# Patient Record
Sex: Male | Born: 1960 | Race: White | Hispanic: No | Smoking: Former smoker
Health system: Southern US, Community
[De-identification: ages and names within clinical notes are randomized; demographics above are authoritative.]

## PROBLEM LIST (undated history)

## (undated) DIAGNOSIS — I898 Other specified noninfective disorders of lymphatic vessels and lymph nodes: Secondary | ICD-10-CM

## (undated) DIAGNOSIS — J189 Pneumonia, unspecified organism: Secondary | ICD-10-CM

## (undated) DIAGNOSIS — I2510 Atherosclerotic heart disease of native coronary artery without angina pectoris: Secondary | ICD-10-CM

## (undated) DIAGNOSIS — I509 Heart failure, unspecified: Secondary | ICD-10-CM

## (undated) DIAGNOSIS — I10 Essential (primary) hypertension: Secondary | ICD-10-CM

## (undated) DIAGNOSIS — I872 Venous insufficiency (chronic) (peripheral): Secondary | ICD-10-CM

## (undated) DIAGNOSIS — D649 Anemia, unspecified: Secondary | ICD-10-CM

## (undated) DIAGNOSIS — F329 Major depressive disorder, single episode, unspecified: Secondary | ICD-10-CM

## (undated) DIAGNOSIS — F32A Depression, unspecified: Secondary | ICD-10-CM

## (undated) DIAGNOSIS — Z87442 Personal history of urinary calculi: Secondary | ICD-10-CM

## (undated) DIAGNOSIS — K759 Inflammatory liver disease, unspecified: Secondary | ICD-10-CM

## (undated) DIAGNOSIS — G4730 Sleep apnea, unspecified: Secondary | ICD-10-CM

## (undated) DIAGNOSIS — R011 Cardiac murmur, unspecified: Secondary | ICD-10-CM

## (undated) DIAGNOSIS — M1990 Unspecified osteoarthritis, unspecified site: Secondary | ICD-10-CM

## (undated) HISTORY — PX: HERNIA REPAIR: SHX51

## (undated) HISTORY — DX: Anemia, unspecified: D64.9

## (undated) HISTORY — PX: CARPAL TUNNEL RELEASE: SHX101

## (undated) HISTORY — DX: Atherosclerotic heart disease of native coronary artery without angina pectoris: I25.10

## (undated) HISTORY — PX: TONSILLECTOMY: SUR1361

## (undated) HISTORY — PX: EYE SURGERY: SHX253

## (undated) HISTORY — DX: Other specified noninfective disorders of lymphatic vessels and lymph nodes: I89.8

## (undated) HISTORY — PX: GASTROPLASTY DUODENAL SWITCH: SHX1699

## (undated) HISTORY — PX: CORONARY ARTERY BYPASS GRAFT: SHX141

## (undated) HISTORY — DX: Unspecified osteoarthritis, unspecified site: M19.90

---

## 2007-10-04 ENCOUNTER — Ambulatory Visit: Payer: Self-pay | Admitting: Internal Medicine

## 2007-11-18 ENCOUNTER — Ambulatory Visit: Payer: Self-pay | Admitting: Internal Medicine

## 2008-02-21 ENCOUNTER — Emergency Department (HOSPITAL_COMMUNITY): Admission: EM | Admit: 2008-02-21 | Discharge: 2008-02-21 | Payer: Self-pay | Admitting: Emergency Medicine

## 2008-03-15 ENCOUNTER — Emergency Department (HOSPITAL_COMMUNITY): Admission: EM | Admit: 2008-03-15 | Discharge: 2008-03-15 | Payer: Self-pay | Admitting: Emergency Medicine

## 2011-01-16 LAB — DIFFERENTIAL
Basophils Absolute: 0 10*3/uL (ref 0.0–0.1)
Basophils Relative: 0 % (ref 0–1)
Monocytes Absolute: 0.5 10*3/uL (ref 0.1–1.0)
Neutro Abs: 4.1 10*3/uL (ref 1.7–7.7)
Neutrophils Relative %: 68 % (ref 43–77)

## 2011-01-16 LAB — BASIC METABOLIC PANEL
BUN: 9 mg/dL (ref 6–23)
CO2: 28 mEq/L (ref 19–32)
Calcium: 8.9 mg/dL (ref 8.4–10.5)
Creatinine, Ser: 0.73 mg/dL (ref 0.4–1.5)
Glucose, Bld: 115 mg/dL — ABNORMAL HIGH (ref 70–99)

## 2011-01-16 LAB — CBC
Hemoglobin: 12.5 g/dL — ABNORMAL LOW (ref 13.0–17.0)
MCHC: 34 g/dL (ref 30.0–36.0)
RDW: 14.1 % (ref 11.5–15.5)

## 2012-09-20 ENCOUNTER — Inpatient Hospital Stay (HOSPITAL_COMMUNITY)
Admission: EM | Admit: 2012-09-20 | Discharge: 2012-09-24 | DRG: 603 | Disposition: A | Discharge status: Home / Self Care | Payer: Medicaid Other | Attending: Internal Medicine | Admitting: Internal Medicine

## 2012-09-20 ENCOUNTER — Encounter (HOSPITAL_COMMUNITY): Payer: Self-pay | Admitting: *Deleted

## 2012-09-20 ENCOUNTER — Emergency Department (HOSPITAL_COMMUNITY): Payer: Medicaid Other

## 2012-09-20 DIAGNOSIS — I872 Venous insufficiency (chronic) (peripheral): Secondary | ICD-10-CM | POA: Diagnosis present

## 2012-09-20 DIAGNOSIS — D638 Anemia in other chronic diseases classified elsewhere: Secondary | ICD-10-CM | POA: Diagnosis present

## 2012-09-20 DIAGNOSIS — Z833 Family history of diabetes mellitus: Secondary | ICD-10-CM

## 2012-09-20 DIAGNOSIS — L97909 Non-pressure chronic ulcer of unspecified part of unspecified lower leg with unspecified severity: Secondary | ICD-10-CM | POA: Diagnosis present

## 2012-09-20 DIAGNOSIS — I8312 Varicose veins of left lower extremity with inflammation: Secondary | ICD-10-CM

## 2012-09-20 DIAGNOSIS — Z7982 Long term (current) use of aspirin: Secondary | ICD-10-CM

## 2012-09-20 DIAGNOSIS — E669 Obesity, unspecified: Secondary | ICD-10-CM | POA: Diagnosis present

## 2012-09-20 DIAGNOSIS — R7309 Other abnormal glucose: Secondary | ICD-10-CM | POA: Diagnosis present

## 2012-09-20 DIAGNOSIS — Z6841 Body Mass Index (BMI) 40.0 and over, adult: Secondary | ICD-10-CM

## 2012-09-20 DIAGNOSIS — IMO0001 Reserved for inherently not codable concepts without codable children: Secondary | ICD-10-CM

## 2012-09-20 DIAGNOSIS — E876 Hypokalemia: Secondary | ICD-10-CM | POA: Diagnosis present

## 2012-09-20 DIAGNOSIS — Z86718 Personal history of other venous thrombosis and embolism: Secondary | ICD-10-CM

## 2012-09-20 DIAGNOSIS — I8311 Varicose veins of right lower extremity with inflammation: Secondary | ICD-10-CM

## 2012-09-20 DIAGNOSIS — L02419 Cutaneous abscess of limb, unspecified: Principal | ICD-10-CM | POA: Diagnosis present

## 2012-09-20 DIAGNOSIS — L03115 Cellulitis of right lower limb: Secondary | ICD-10-CM

## 2012-09-20 DIAGNOSIS — M79609 Pain in unspecified limb: Secondary | ICD-10-CM

## 2012-09-20 DIAGNOSIS — F17200 Nicotine dependence, unspecified, uncomplicated: Secondary | ICD-10-CM | POA: Diagnosis present

## 2012-09-20 HISTORY — DX: Venous insufficiency (chronic) (peripheral): I87.2

## 2012-09-20 HISTORY — DX: Morbid (severe) obesity due to excess calories: E66.01

## 2012-09-20 LAB — COMPREHENSIVE METABOLIC PANEL
ALT: 15 U/L (ref 0–53)
AST: 17 U/L (ref 0–37)
Albumin: 3 g/dL — ABNORMAL LOW (ref 3.5–5.2)
Alkaline Phosphatase: 71 U/L (ref 39–117)
Chloride: 98 mEq/L (ref 96–112)
Potassium: 2.9 mEq/L — ABNORMAL LOW (ref 3.5–5.1)
Total Bilirubin: 0.5 mg/dL (ref 0.3–1.2)

## 2012-09-20 LAB — POCT I-STAT, CHEM 8
Creatinine, Ser: 0.9 mg/dL (ref 0.50–1.35)
Glucose, Bld: 124 mg/dL — ABNORMAL HIGH (ref 70–99)
Hemoglobin: 12.6 g/dL — ABNORMAL LOW (ref 13.0–17.0)
TCO2: 29 mmol/L (ref 0–100)

## 2012-09-20 LAB — CBC WITH DIFFERENTIAL/PLATELET
Basophils Absolute: 0.1 10*3/uL (ref 0.0–0.1)
Basophils Relative: 1 % (ref 0–1)
Hemoglobin: 12.2 g/dL — ABNORMAL LOW (ref 13.0–17.0)
MCHC: 34.1 g/dL (ref 30.0–36.0)
Monocytes Relative: 11 % (ref 3–12)
Neutro Abs: 7.9 10*3/uL — ABNORMAL HIGH (ref 1.7–7.7)
Neutrophils Relative %: 73 % (ref 43–77)
RDW: 13.7 % (ref 11.5–15.5)

## 2012-09-20 LAB — RETICULOCYTES
RBC.: 4.45 MIL/uL (ref 4.22–5.81)
Retic Count, Absolute: 40.1 10*3/uL (ref 19.0–186.0)
Retic Ct Pct: 0.9 % (ref 0.4–3.1)

## 2012-09-20 LAB — BASIC METABOLIC PANEL
CO2: 31 mEq/L (ref 19–32)
Chloride: 98 mEq/L (ref 96–112)
Creatinine, Ser: 0.73 mg/dL (ref 0.50–1.35)
GFR calc Af Amer: >90 mL/min (ref >90)
Sodium: 138 mEq/L (ref 135–145)

## 2012-09-20 LAB — MAGNESIUM: Magnesium: 2.1 mg/dL (ref 1.5–2.5)

## 2012-09-20 MED ORDER — ASPIRIN EC 81 MG PO TBEC
81.0000 mg | DELAYED_RELEASE_TABLET | Freq: Every day | ORAL | Status: DC
  Administered 2012-09-20 – 2012-09-24 (×5): 81 mg via ORAL
  Filled 2012-09-20 (×5): qty 1

## 2012-09-20 MED ORDER — POTASSIUM CHLORIDE CRYS ER 20 MEQ PO TBCR
40.0000 meq | EXTENDED_RELEASE_TABLET | ORAL | Status: AC
  Administered 2012-09-20 (×2): 40 meq via ORAL
  Filled 2012-09-20 (×2): qty 2

## 2012-09-20 MED ORDER — SODIUM CHLORIDE 0.9 % IJ SOLN
3.0000 mL | Freq: Two times a day (BID) | INTRAMUSCULAR | Status: DC
  Administered 2012-09-21 – 2012-09-23 (×3): 3 mL via INTRAVENOUS

## 2012-09-20 MED ORDER — ENOXAPARIN SODIUM 40 MG/0.4ML ~~LOC~~ SOLN: 40.0000 mg | SUBCUTANEOUS | Status: DC

## 2012-09-20 MED ORDER — ACETAMINOPHEN 650 MG RE SUPP: 650.0000 mg | Freq: Four times a day (QID) | RECTAL | Status: DC | PRN

## 2012-09-20 MED ORDER — SODIUM CHLORIDE 0.9 % IV SOLN
Freq: Once | INTRAVENOUS | Status: AC
  Administered 2012-09-20: 07:00:00 via INTRAVENOUS

## 2012-09-20 MED ORDER — ADULT MULTIVITAMIN W/MINERALS CH
1.0000 | ORAL_TABLET | Freq: Every day | ORAL | Status: DC
  Administered 2012-09-20 – 2012-09-24 (×5): 1 via ORAL
  Filled 2012-09-20 (×5): qty 1

## 2012-09-20 MED ORDER — SODIUM CHLORIDE 0.9 % IV SOLN
INTRAVENOUS | Status: AC
  Administered 2012-09-20: 12:00:00 via INTRAVENOUS

## 2012-09-20 MED ORDER — ONDANSETRON HCL 4 MG PO TABS: 4.0000 mg | ORAL_TABLET | Freq: Four times a day (QID) | ORAL | Status: DC | PRN

## 2012-09-20 MED ORDER — MORPHINE SULFATE 2 MG/ML IJ SOLN
2.0000 mg | INTRAMUSCULAR | Status: DC | PRN
  Administered 2012-09-20 – 2012-09-22 (×3): 2 mg via INTRAVENOUS
  Filled 2012-09-20 (×3): qty 1

## 2012-09-20 MED ORDER — ACETAMINOPHEN 325 MG PO TABS: 650.0000 mg | ORAL_TABLET | Freq: Four times a day (QID) | ORAL | Status: DC | PRN

## 2012-09-20 MED ORDER — VANCOMYCIN HCL 10 G IV SOLR
1500.0000 mg | Freq: Two times a day (BID) | INTRAVENOUS | Status: DC
  Administered 2012-09-20 – 2012-09-22 (×3): 1500 mg via INTRAVENOUS
  Filled 2012-09-20 (×5): qty 1500

## 2012-09-20 MED ORDER — METHADONE HCL 10 MG PO TABS
170.0000 mg | ORAL_TABLET | Freq: Every day | ORAL | Status: DC
  Administered 2012-09-21 – 2012-09-24 (×4): 170 mg via ORAL
  Filled 2012-09-20 (×4): qty 17

## 2012-09-20 MED ORDER — POTASSIUM CHLORIDE CRYS ER 20 MEQ PO TBCR
40.0000 meq | EXTENDED_RELEASE_TABLET | Freq: Once | ORAL | Status: AC
  Administered 2012-09-20: 40 meq via ORAL
  Filled 2012-09-20: qty 2

## 2012-09-20 MED ORDER — ENOXAPARIN SODIUM 80 MG/0.8ML ~~LOC~~ SOLN
80.0000 mg | SUBCUTANEOUS | Status: DC
  Administered 2012-09-20 – 2012-09-23 (×4): 80 mg via SUBCUTANEOUS
  Filled 2012-09-20 (×5): qty 0.8

## 2012-09-20 MED ORDER — ONDANSETRON HCL 4 MG/2ML IJ SOLN: 4.0000 mg | Freq: Four times a day (QID) | INTRAMUSCULAR | Status: DC | PRN

## 2012-09-20 MED ORDER — SODIUM CHLORIDE 0.9 % IV SOLN: 250.0000 mL | INTRAVENOUS | Status: DC | PRN

## 2012-09-20 MED ORDER — METHADONE HCL 10 MG/ML PO CONC: 170.0000 mg | Freq: Every day | ORAL | Status: DC

## 2012-09-20 MED ORDER — VANCOMYCIN HCL IN DEXTROSE 1-5 GM/200ML-% IV SOLN
1000.0000 mg | Freq: Once | INTRAVENOUS | Status: AC
  Administered 2012-09-20: 1000 mg via INTRAVENOUS
  Filled 2012-09-20: qty 200

## 2012-09-20 MED ORDER — NICOTINE 21 MG/24HR TD PT24
21.0000 mg | MEDICATED_PATCH | Freq: Every day | TRANSDERMAL | Status: DC
  Administered 2012-09-20 – 2012-09-23 (×4): 21 mg via TRANSDERMAL
  Filled 2012-09-20 (×5): qty 1

## 2012-09-20 MED ORDER — SODIUM CHLORIDE 0.9 % IJ SOLN: 3.0000 mL | INTRAMUSCULAR | Status: DC | PRN

## 2012-09-20 MED ORDER — VITAMIN B-1 100 MG PO TABS
100.0000 mg | ORAL_TABLET | Freq: Every day | ORAL | Status: DC
Start: 2012-09-20 — End: 2012-09-24
  Administered 2012-09-20 – 2012-09-24 (×5): 100 mg via ORAL
  Filled 2012-09-20 (×5): qty 1

## 2012-09-20 MED ORDER — FOLIC ACID 1 MG PO TABS
1.0000 mg | ORAL_TABLET | Freq: Every day | ORAL | Status: DC
  Administered 2012-09-20 – 2012-09-24 (×5): 1 mg via ORAL
  Filled 2012-09-20 (×5): qty 1

## 2012-09-20 NOTE — Progress Notes (Signed)
ANTIBIOTIC CONSULT NOTE - INITIAL  Pharmacy Consult for Vancomycin Indication: Right leg cellulitis  No Known Allergies  Patient Measurements: Height: 6' 0.44" (184 cm) Weight: 360 lb 0.2 oz (163.3 kg) IBW/kg (Calculated) : 78.61  Vital Signs: Temp: 98.3 F (36.8 C) (06/10 1157) Temp src: Oral (06/10 1157) BP: 145/85 mmHg (06/10 1157) Pulse Rate: 81 (06/10 1157) Intake/Output from previous day:   Intake/Output from this shift:    Labs:  Recent Labs  09/20/12 0610 09/20/12 0620  WBC 10.8*  --   HGB 12.2* 12.6*  PLT 234  --   CREATININE 0.62 0.90   Estimated Creatinine Clearance: 156.3 ml/min (by C-G formula based on Cr of 0.9). No results found for this basename: VANCOTROUGH, VANCOPEAK, VANCORANDOM, GENTTROUGH, GENTPEAK, GENTRANDOM, TOBRATROUGH, TOBRAPEAK, TOBRARND, AMIKACINPEAK, AMIKACINTROU, AMIKACIN,  in the last 72 hours   Microbiology: No results found for this or any previous visit (from the past 720 hour(s)).  Medical History: Past Medical History  Diagnosis Date  . Venous insufficiency of leg   . Morbid obesity   . Venous stasis dermatitis     Assessment: 52 y.o. M who presented to [facility] with right leg swelling, redness, and pain for ~1 week. Dopplers were negative for a DVT and pharmacy was consulted to start Vancomycin for RLE cellulitis. The patient received Vancomycin 1g around 0700 in the [facility] this morning. Wt: 163.3 kg, SCr 0.9, CrCl~90 ml/min.   Since the patient was not adequately loaded when they started Vancomycin this morning -- will schedule the first dose to be given a little earlier.   Goal of Therapy:  Vancomycin trough level 10-15 mcg/ml  Plan:  1. Vancomycin 1500 mg IV every 12 hours 2. Will continue to follow renal function, culture results, LOT, and antibiotic de-escalation plans   Georgina Pillion, PharmD, BCPS Clinical Pharmacist Pager: [phone] 09/20/2012 12:36 PM

## 2012-09-20 NOTE — ED Provider Notes (Signed)
History     CSN: [id number]  Arrival date & time 09/20/12  4098   First MD Initiated Contact with Patient 09/20/12 0602      Chief Complaint  Patient presents with  . Leg Pain  . Leg Swelling  . Wound Infection    (Consider location/radiation/quality/duration/timing/severity/associated sxs/prior treatment) HPI [name] is a 52 y.o. male who presents to ED with complaint of right lower leg swelling and pain. States he has peripheral vascular disease, at present not followed by anyone due to lack of insurance. Not on any medications. States dropped a pipe on his right lower leg hitting the medial ankle about 3 months ago. Since then, developed a non healing wound to that area. States has been putting triple antibiotic ointment daily. In the last 2-3 days, states entire right leg swelled up and now red and painful to the touch up to this thigh. Denies fever, admits to chills. Did not check temp at home. Pt works as a [employer]. Mentions possible dvt in the past but states never took any blood thinner medications. Denies hx of the same.    Past Medical History  Diagnosis Date  . Venous insufficiency of leg   . DVT (deep venous thrombosis)     Past Surgical History  Procedure Laterality Date  . Carpal tunnel release      bilateral   . Eye surgery      No family history on file.  History  Substance Use Topics  . Smoking status: Current Every Day Smoker -- 0.50 packs/day    Types: Cigarettes  . Smokeless tobacco: Not on file  . Alcohol Use: No      Review of Systems  Constitutional: Positive for chills. Negative for fever and fatigue.  Respiratory: Negative.   Cardiovascular: Positive for leg swelling. Negative for chest pain and palpitations.  Gastrointestinal: Negative.   Musculoskeletal: Positive for myalgias.  Skin: Positive for color change and wound.  Neurological: Negative for weakness and numbness.  All other systems reviewed and are  negative.    Allergies  Review of patient's allergies indicates no known allergies.  Home Medications  No current outpatient prescriptions on file.  BP 142/78  Temp(Src) 99.9 F (37.7 C) (Oral)  Resp 18  SpO2 95%  Physical Exam  Nursing note and vitals reviewed. Constitutional: He appears well-developed and well-nourished. No distress.  HENT:  Head: Normocephalic.  Eyes: Conjunctivae are normal.  Cardiovascular: Normal rate, regular rhythm and normal heart sounds.   Pulmonary/Chest: Effort normal and breath sounds normal. No respiratory distress. He has no wheezes. He has no rales.  Musculoskeletal: He exhibits no edema.  Superficial draining, malodorous skin ulceration to the right medial ankle. There is swelling to the right lower extremity up to the thigh. Erythema, warmth to the touch from the foot up to the medial thigh. Foot is warm, good cap refill <2sec to the toes.   Neurological: He is alert.  Skin: Skin is warm and dry.    ED Course  Procedures (including critical care time)  Results for orders placed during the hospital encounter of 09/20/12  CBC WITH DIFFERENTIAL      Result Value Range   WBC 10.8 (*) 4.0 - 10.5 K/uL   RBC 4.34  4.22 - 5.81 MIL/uL   Hemoglobin 12.2 (*) 13.0 - 17.0 g/dL   HCT 11.9 (*) 14.7 - 82.9 %   MCV 82.5  78.0 - 100.0 fL   MCH 28.1  26.0 - 34.0 pg  MCHC 34.1  30.0 - 36.0 g/dL   RDW 86.5  78.4 - 69.6 %   Platelets 234  150 - 400 K/uL   Neutrophils Relative % 73  43 - 77 %   Neutro Abs 7.9 (*) 1.7 - 7.7 K/uL   Lymphocytes Relative 15  12 - 46 %   Lymphs Abs 1.7  0.7 - 4.0 K/uL   Monocytes Relative 11  3 - 12 %   Monocytes Absolute 1.2 (*) 0.1 - 1.0 K/uL   Eosinophils Relative 0  0 - 5 %   Eosinophils Absolute 0.0  0.0 - 0.7 K/uL   Basophils Relative 1  0 - 1 %   Basophils Absolute 0.1  0.0 - 0.1 K/uL  COMPREHENSIVE METABOLIC PANEL      Result Value Range   Sodium 133 (*) 135 - 145 mEq/L   Potassium 2.9 (*) 3.5 - 5.1 mEq/L    Chloride 98  96 - 112 mEq/L   CO2 25  19 - 32 mEq/L   Glucose, Bld 121 (*) 70 - 99 mg/dL   BUN 6  6 - 23 mg/dL   Creatinine, Ser 2.95  0.50 - 1.35 mg/dL   Calcium 9.1  8.4 - 28.4 mg/dL   Total Protein 7.8  6.0 - 8.3 g/dL   Albumin 3.0 (*) 3.5 - 5.2 g/dL   AST 17  0 - 37 U/L   ALT 15  0 - 53 U/L   Alkaline Phosphatase 71  39 - 117 U/L   Total Bilirubin 0.5  0.3 - 1.2 mg/dL   GFR calc non Af Amer >90  >90 mL/min   GFR calc Af Amer >90  >90 mL/min  BASIC METABOLIC PANEL      Result Value Range   Sodium 138  135 - 145 mEq/L   Potassium 3.2 (*) 3.5 - 5.1 mEq/L   Chloride 98  96 - 112 mEq/L   CO2 31  19 - 32 mEq/L   Glucose, Bld 100 (*) 70 - 99 mg/dL   BUN 6  6 - 23 mg/dL   Creatinine, Ser 1.32  0.50 - 1.35 mg/dL   Calcium 9.2  8.4 - 44.0 mg/dL   GFR calc non Af Amer >90  >90 mL/min   GFR calc Af Amer >90  >90 mL/min  MAGNESIUM      Result Value Range   Magnesium 2.1  1.5 - 2.5 mg/dL  HEMOGLOBIN N0U      Result Value Range   Hemoglobin A1C 5.5  <5.7 %   Mean Plasma Glucose 111  <117 mg/dL  VITAMIN V25      Result Value Range   Vitamin B-12 605  211 - 911 pg/mL  FOLATE      Result Value Range   Folate 14.0    IRON AND TIBC      Result Value Range   Iron 18 (*) 42 - 135 ug/dL   TIBC 366  440 - 347 ug/dL   Saturation Ratios 6 (*) 20 - 55 %   UIBC 280  125 - 400 ug/dL  FERRITIN      Result Value Range   Ferritin 362 (*) 22 - 322 ng/mL  RETICULOCYTES      Result Value Range   Retic Ct Pct 0.9  0.4 - 3.1 %   RBC. 4.45  4.22 - 5.81 MIL/uL   Retic Count, Manual 40.1  19.0 - 186.0 K/uL  GLUCOSE, CAPILLARY      Result  Value Range   Glucose-Capillary 104 (*) 70 - 99 mg/dL  CBC      Result Value Range   WBC 9.4  4.0 - 10.5 K/uL   RBC 3.86 (*) 4.22 - 5.81 MIL/uL   Hemoglobin 10.7 (*) 13.0 - 17.0 g/dL   HCT 29.5 (*) 28.4 - 13.2 %   MCV 82.6  78.0 - 100.0 fL   MCH 27.7  26.0 - 34.0 pg   MCHC 33.5  30.0 - 36.0 g/dL   RDW 44.0  10.2 - 72.5 %   Platelets 228  150 - 400 K/uL   POCT I-STAT, CHEM 8      Result Value Range   Sodium 138  135 - 145 mEq/L   Potassium 2.9 (*) 3.5 - 5.1 mEq/L   Chloride 97  96 - 112 mEq/L   BUN 4 (*) 6 - 23 mg/dL   Creatinine, Ser 3.66  0.50 - 1.35 mg/dL   Glucose, Bld 440 (*) 70 - 99 mg/dL   Calcium, Ion 3.47  4.25 - 1.23 mmol/L   TCO2 29  0 - 100 mmol/L   Hemoglobin 12.6 (*) 13.0 - 17.0 g/dL   HCT 95.6 (*) 38.7 - 56.4 %   Dg Foot Complete Right  09/20/2012   *RADIOLOGY REPORT*  Clinical Data: Wound in the medial aspect of the right foot for 3 months.  Redness and swelling.  Symptoms are worsening.  RIGHT FOOT COMPLETE - 3+ VIEW  Comparison: 07/01/2012  Findings: No evidence of acute fracture or subluxation of the right foot.  No focal bone lesion or bone destruction.  No cortical erosion or bone loss to suggest osteomyelitis.  Small Achilles spur of the calcaneus.  Mild degenerative changes in the ankle joint. Dorsal soft tissue swelling.  No radiopaque soft tissue foreign bodies or gas collections.  IMPRESSION: No acute bony abnormalities.  Dorsal soft tissue swelling.  No radiographic evidence of osteomyelitis.   Original Report Authenticated By: Burman Nieves, M.D.      1. Pain in limb   2. Venous stasis dermatitis, unspecified laterality       MDM  Pt with right leg non healing wound that now appears infected. He has swelling of the right leg up to the thigh with erythema extending up the medial thigh to the groin as well. He is afebrile here. He is non toxic appearing otherwise. LE venous doppler obtain and is negative for DVT. Pt started on vancomycin in ED for infection. Pt admitted to the medicine team for further treatment with iv antibiotics.   Filed Vitals:   09/20/12 1428 09/20/12 2250 09/21/12 0500 09/21/12 0535  BP: 110/50 140/69  138/57  Pulse: 72 76  79  Temp: 99.2 F (37.3 C) 99.4 F (37.4 C)  98.5 F (36.9 C)  TempSrc: Oral Oral  Oral  Resp: 18 19  17   Height:      Weight:   360 lb 14.3 oz (163.7 kg)    SpO2: 96% 95%  97%           Lottie Mussel, PA-C 09/21/12 0715  Myriam Jacobson Amelia Macken, PA-C 09/21/12 0715

## 2012-09-20 NOTE — Progress Notes (Signed)
Right lower extremity venous duplex completed.  Right:  No evidence of DVT, superficial thrombosis, or Baker's cyst.  Left:  Negative for DVT in the common femoral vein.  

## 2012-09-20 NOTE — Progress Notes (Signed)
Pharmacy clarification:  Adjusted lovenox dose to 0.5 mg/kg q24h for DVT pxl in this patient with BMI>30.  Discussed with Internal Medicine MD.  Plan - Lovenox 80 mg sq q24h for DVT pxl - Further adjustments per MD  Jill Side L. Illene Bolus, PharmD, BCPS Clinical Pharmacist Pager: [phone] Pharmacy: [phone] 09/20/2012 12:21 PM

## 2012-09-20 NOTE — ED Notes (Signed)
No changes, alert, NAD, calm, interactive, resps e/u, speaking in clear complete sentences, back from xray.

## 2012-09-20 NOTE — ED Notes (Addendum)
Here for increased leg swelling, pain & redness. R>L. Wound on R heal worsening. Denies h/o DM. Reports venous insufficiency. Sx ongoing for ~ 3 months. Redness began ~ 2d ago. Mentions chills and possible fever. Has been applying abx ointment. 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 Mentions DVT.

## 2012-09-20 NOTE — Consult Note (Signed)
WOC consult Note Reason for Consult: Consult requested for right leg and foot.  Right leg with generalized edema and erythremia extending from foot to upper thigh.  Pt states foot wound started when he wore wet boots. Wound type: Full thickness to right inner foot near ankle, cellulitis without blistering, drainage, or open wounds to right leg Measurement: Right foot 4X4X.2cm Wound bed:red and moist  Drainage (amount, consistency, odor) no odor, small yellow drainage Periwound: dry peeling skin to edges easily removed with scissors. Dressing procedure/placement/frequency:foam dressing to absorb drainage and promote healing. Expect  IV antibiotics to improve cellulitis. Please re-consult if further assistance is needed.  Thank-you,  Cammie Mcgee MSN, RN, CWOCN, [place], CNS [phone]

## 2012-09-20 NOTE — ED Notes (Signed)
RN on 6N to return phone call for report

## 2012-09-20 NOTE — H&P (Signed)
Internal Medicine Attending Admission Note Date: 09/20/2012  Patient name: [name] Medical record number: [id number] Date of birth: 05-29-1960 Age: 52 y.o. Gender: male  I saw and evaluated the patient. I reviewed the resident's note and I agree with the resident's findings and plan as documented in the resident's note, with the following additional comments.  Chief Complaint(s): Right leg pain, swelling, redness  History - key components related to admission: Patient is a 52 year old man with history of obesity, chronic venous stasis, and other problems as outlined in the medical history admitted with pain, redness, swelling, and warmth of his right leg which has progressed over the past week.  He also reports subjective fever and chills.   Physical Exam - key components related to admission:  Filed Vitals:   09/20/12 0547 09/20/12 1157 09/20/12 1200 09/20/12 1428  BP: 142/78 145/85  110/50  Pulse:  81  72  Temp: 99.9 F (37.7 C) 98.3 F (36.8 C)  99.2 F (37.3 C)  TempSrc: Oral Oral  Oral  Resp: 18 20  18   Height:  6\' 1"  (1.854 m) 6' 0.44" (1.84 m)   Weight:  384 lb 3.2 oz (174.272 kg) 360 lb 0.2 oz (163.3 kg)   SpO2: 95% 93%  96%   General: Alert, no distress Lungs: Clear Heart: Regular; no extra sounds or murmurs Abdomen: Bowel sounds present, soft, nontender Extremities: Right leg is swollen, erythematous, and warm, and this extends to the lower right thigh medially; the foot is warm and well-perfused; dorsalis pedis pulse is intact; there is a wound on the medial surface of the right foot Skin: Bilateral leg hyperpigmentation consistent with chronic venous stasis   Lab results:   Basic Metabolic Panel:  Recent Labs  40/98/11 0610 09/20/12 0620 09/20/12 1306  NA 133* 138  --   K 2.9* 2.9*  --   CL 98 97  --   CO2 25  --   --   GLUCOSE 121* 124*  --   BUN 6 4*  --   CREATININE 0.62 0.90  --   CALCIUM 9.1  --   --   MG  --   --  2.1   Liver Function  Tests:  Recent Labs  09/20/12 0610  AST 17  ALT 15  ALKPHOS 71  BILITOT 0.5  PROT 7.8  ALBUMIN 3.0*    CBC:  Recent Labs  09/20/12 0610 09/20/12 0620  WBC 10.8*  --   NEUTROABS 7.9*  --   HGB 12.2* 12.6*  HCT 35.8* 37.0*  MCV 82.5  --   PLT 234  --     CBG:  Recent Labs  09/20/12 0849  GLUCAP 104*   Anemia Panel:  Recent Labs  09/20/12 1306  RETICCTPCT 0.9    Lower Extremity Venous Duplex Summary: - No obvious evidence of deep vein or superficial thrombosis involving the right lower extremity and left common femoral vein. An enlarged inguinal lymph node is noted on the right. Varicose veins are visualized in the right thigh and calf that appear patent. - No evidence of Baker's cyst on the right.    Imaging results:  Dg Foot Complete Right  09/20/2012   *RADIOLOGY REPORT*  Clinical Data: Wound in the medial aspect of the right foot for 3 months.  Redness and swelling.  Symptoms are worsening.  RIGHT FOOT COMPLETE - 3+ VIEW  Comparison: 07/01/2012  Findings: No evidence of acute fracture or subluxation of the right foot.  No focal bone  lesion or bone destruction.  No cortical erosion or bone loss to suggest osteomyelitis.  Small Achilles spur of the calcaneus.  Mild degenerative changes in the ankle joint. Dorsal soft tissue swelling.  No radiopaque soft tissue foreign bodies or gas collections.  IMPRESSION: No acute bony abnormalities.  Dorsal soft tissue swelling.  No radiographic evidence of osteomyelitis.   Original Report Authenticated By: Burman Nieves, M.D.     Assessment & Plan by Problem:  1.  Right lower extremity cellulitis.  Patient presents with clinical findings consistent with cellulitis, likely secondary to wound on right foot.  Plan is empiric IV vancomycin pending blood culture results; pain control; elevate foot.  2.  Right foot wound.  Wound care consulted.  3.  Hypokalemia.  Plan is replace and follow potassium level.  4.  Other  problems as per the resident physician's note.

## 2012-09-20 NOTE — H&P (Signed)
Date: 09/20/2012               Patient Name:  [name] MRN: [id number]  DOB: 1961-02-01 Age / Sex: 52 y.o., male   PCP: No primary provider on file.         Medical Service: Internal Medicine Teaching Service         Attending Physician: Dr. Farley Ly, MD    First Contact: Dr. Elenor Legato Pager: [phone]  Second Contact: Dr. Suszanne Conners Pager: [phone]       After Hours (After 5p/  First Contact Pager: [phone]  weekends / holidays): Second Contact Pager: [phone]   Chief Complaint: right leg pain and swelling with wound  History of Present Illness: [name] is a 52 year old man with a PMH significant for morbid obesity and venous stasis who presented to the [facility] ED complaining of right leg swelling, redness, and pain that started about 1 week prior.  He states that about 1-2 months ago he got a wound on his medial right ankle that has steadily grown bigger over that time period and then about 1 week ago he started to notice increase swelling and redness that "creeped up my leg."  He states that he has had problems like this in the past but this is the worst its been.  He states that he had one episode of shaking chills about 1 week ago with nausea that has not recurred since then.  He denies fevers, chest pain, vomiting, shortness of breath, dyspnea on exertion, diarrhea, or constipation.  He states that he has chronic pain in both legs with numbness and tingling, "like they are always just a little asleep" for many years.  The numbness and tingling goes up to his knees bilaterally.  He also states that he has had discoloration on his shins for many years as well.  He does not follow with a doctor regularly.    Meds:  Current Outpatient Prescriptions  Medication Sig Dispense Refill  . methadone (DOLOPHINE) 10 MG/ML solution Take 170 mg by mouth daily. Methadone clinic       Allergies: Allergies as of 09/20/2012  . (No Known Allergies)   Past Medical History  Diagnosis Date    . Venous insufficiency of leg   . Morbid obesity   . Venous stasis dermatitis    Past Surgical History  Procedure Laterality Date  . Carpal tunnel release      bilateral   . Eye surgery     Family History  Problem Relation Age of Onset  . Diabetes Father   . Heart disease Father   . Leukemia Mother     passed away 2008-08-25  . Hypertension Father    History   Social History  . Marital Status: Legally Separated    Spouse Name: N/A    Number of Children: N/A  . Years of Education: N/A   Occupational History  . Not on file.   Social History Main Topics  . Smoking status: Current Every Day Smoker -- 3.00 packs/day for 35 years    Types: Cigarettes  . Smokeless tobacco: Never Used     Comment: currently only smoking 0.5 ppd  . Alcohol Use: No  . 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 Sexually Active: Not on file   Other Topics Concern  . Not on file   Social History Narrative  Divorced man.  Currently driving truck over the road.  Previously Engineer, technical sales.  Lives in [place], [place].  4 children all in good health.  3 sisters in good health.  Father in poor health near by   Review of Systems: Constitutional: Positive for chills.  Denies fever, diaphoresis, appetite change and fatigue.  HEENT: Denies photophobia, eye pain, redness, hearing loss, ear pain, congestion, sore throat, rhinorrhea, sneezing, mouth sores, trouble swallowing, neck pain, neck stiffness and tinnitus.   Respiratory: Denies SOB, DOE, cough, chest tightness,  and wheezing.   Cardiovascular: Denies chest pain, palpitations and leg swelling.  Gastrointestinal: Denies nausea, vomiting, abdominal pain, diarrhea, constipation, blood in stool and abdominal distention.  Genitourinary: Denies dysuria, urgency, frequency, hematuria, flank pain and difficulty urinating.  Endocrine: Denies: hot or cold intolerance, sweats,  changes in hair or nails, polyuria, polydipsia. Musculoskeletal: Denies myalgias, back pain, joint swelling, arthralgias and gait problem.  Skin: Positive for rash and wound.  Denies pallor Neurological: Denies dizziness, seizures, syncope, weakness, light-headedness, numbness and headaches.  Hematological: Denies adenopathy. Easy bruising, personal or family bleeding history  Psychiatric/Behavioral: Denies suicidal ideation, mood changes, confusion, nervousness, sleep disturbance and agitation  Physical Exam: Blood pressure 142/78, temperature 99.9 F (37.7 C), temperature source Oral, resp. rate 18, SpO2 95.00%. Constitutional: Vital signs reviewed.  Patient is a well-developed and well-nourished morbidly obese man in mild distress from pain.  He is cooperative with exam. Alert and oriented x3.  Head: Normocephalic and atraumatic Ear: TM normal bilaterally Nose: No erythema or drainage noted.  Turbinates normal Mouth: no erythema or exudates, MMM Eyes: PERRL, EOMI, conjunctivae normal, No scleral icterus.  Neck: Supple, Trachea midline normal ROM, No JVD, mass, thyromegaly, or carotid bruit present.  Cardiovascular: RRR, S1 normal, S2 normal, no MRG, pulses symmetric and intact bilaterally Pulmonary/Chest: normal respiratory effort, mild expiratory wheezes in the posterior lung fields bilaterally.  no rales, or rhonchi Abdominal: obese, Soft. Non-tender, non-distended, bowel sounds are normal, no masses, organomegaly, or guarding present.  GU: no CVA tenderness Musculoskeletal: No joint deformities, erythema, or stiffness, ROM full and no nontender Hematology: no cervical, inginal, or axillary adenopathy.  Neurological: A&O x3, Strength is normal and symmetric bilaterally, cranial nerve II-XII are grossly intact, no focal motor deficit, sensory intact to light touch bilaterally.  Skin: There is bilateral evidence of venous stasis dermatitis.  Left leg with 2+ pitting edema to the knee.   Right leg is significantly more swollen then the left with 3+ pitting, tense edema to the mid thigh.  There is erythema and warmth up the right leg to the mid thigh with the posterior thigh very tense and painful to palpation.  On the right mid lateral lower leg is a superficial wound that is open and draining serous fluid.  On the anterior of the right lower tibia is a healing wound.  On the right medial malleolus there is a superficial, malodorous skin ulcer that is 4 cm in diameter.  Foot is warm with normal capillary refill.   Psychiatric: Normal mood and affect. speech and behavior is normal. Judgment and thought content normal. Cognition and memory are normal.   Lab results: Basic Metabolic Panel:  Recent Labs  16/10/96 0610 09/20/12 0620  NA 133* 138  K 2.9* 2.9*  CL 98 97  CO2 25  --   GLUCOSE 121* 124*  BUN 6 4*  CREATININE 0.62 0.90  CALCIUM 9.1  --    Liver Function Tests:  Recent Labs  09/20/12 0610  AST 17  ALT 15  ALKPHOS 71  BILITOT 0.5  PROT 7.8  ALBUMIN 3.0*   CBC:  Recent Labs  09/20/12 0610 09/20/12 0620  WBC 10.8*  --   NEUTROABS 7.9*  --   HGB 12.2* 12.6*  HCT 35.8* 37.0*  MCV 82.5  --   PLT 234  --    Imaging results:  Dg Foot Complete Right  09/20/2012   *RADIOLOGY REPORT*  Clinical Data: Wound in the medial aspect of the right foot for 3 months.  Redness and swelling.  Symptoms are worsening.  RIGHT FOOT COMPLETE - 3+ VIEW  Comparison: 07/01/2012  Findings: No evidence of acute fracture or subluxation of the right foot.  No focal bone lesion or bone destruction.  No cortical erosion or bone loss to suggest osteomyelitis.  Small Achilles spur of the calcaneus.  Mild degenerative changes in the ankle joint. Dorsal soft tissue swelling.  No radiopaque soft tissue foreign bodies or gas collections.  IMPRESSION: No acute bony abnormalities.  Dorsal soft tissue swelling.  No radiographic evidence of osteomyelitis.   Original Report Authenticated By:  Burman Nieves, M.D.   Lower extremity Venous doppler: Preliminary report states no evidence of DVT, superficial thrombus, or baker's cyst on the right.  Left leg negative for DVT in common femoral vein.    Assessment & Plan by Problem: [name] is a 52 year old man who presents to the [facility] ED with right leg cellulitis and a superficial venous stasis ulcer.  1.  Cellulitis of the right leg:  There is significant cellulitis of the right leg that extends to the mid thigh on the right.  With the associated purulent wound and the high likelihood that he will be diagnosed with diabetes during this hospitalization we will treat as if this is MRSA.  He received 1000 mg of Vancomycin in the ED and states that the redness is already improved.  - Admit to med/surg  - Vanc per pharmacy  - leg elevation  - Pain control with IV morphine on top of his chronic methadone therapy  - Compression as soon as his pain allows  - Monitor vital signs and if he spikes a fever consider blood cultures  - F/U results of the wound culture  2.  Right medial malleolus venous stasis ulcer: He states that the wound has been present for several weeks to months and he has been putting neosporin on it.  It appears to be superficial but it is hard to tell with the exudate in the bottom of the wound.  X-ray did not show evidence of bony erosions or signs of osteomyelitis.    - Consult wound care for treatment recommendations  - Consider debridement   3.  Hypokalemia: On admission his potassium is 2.9.  He states that he doesn't take any medications other then his methodone but states that he has been tried on Lasix before.  He has very mild hypertension so it is less likely that he has hyperaldosteronism.  He denies vomiting, or diarrhea that would be increased GI loss.  He has low albumin so it may be decreased intake.   - Replace with oral K  - recheck Bmet at 1900 and in AM  4.  Hyperglycemia:  His CBG on admission was 124 and  he has a family history of diabetes.  He states that he has never been told that he is a diabetic  - Check A1C  - monitor CBGs  5.  Normocytic anemia: He has a noted mild normocytic anemia dating back to at least 2009 with similar levels.  He denies blood in his stool or any weakness or fatigue.  This likely represents anemia of chronic inflammation  - Check anemia panel  6.  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  He followes with [facility] in [place], [place]. I called and spoke with the facility and they confirmed that he is on chronic methadone 170 mg daily.  He states that he took his medication the morning prior to admission so we will continue it starting tomorrow.  The phone number of the facility is [phone].  They are aware that he is admitted and will be receiving IV pain medication during his stay.   Dispo: Disposition is deferred at this time, awaiting improvement of current medical problems. Anticipated discharge in approximately 3-4 day(s).   The patient does not have a current PCP (No primary provider on file.), therefore will be requiring OPC follow-up after discharge.   The patient does not have transportation limitations that hinder transportation to clinic appointments.  Signed: Leodis Sias, MD 09/20/2012, 11:26 AM

## 2012-09-20 NOTE — ED Notes (Signed)
R foot dressed with bacitracin, xeroform, abd pad and kerlex gauze wrap.

## 2012-09-20 NOTE — ED Notes (Signed)
Admitting MD at bedside.

## 2012-09-20 NOTE — ED Notes (Signed)
Vascular reports they will do bedside study around 0900

## 2012-09-21 ENCOUNTER — Inpatient Hospital Stay (HOSPITAL_COMMUNITY): Payer: Medicaid Other

## 2012-09-21 DIAGNOSIS — L03119 Cellulitis of unspecified part of limb: Principal | ICD-10-CM

## 2012-09-21 LAB — FOLATE: Folate: 14 ng/mL

## 2012-09-21 LAB — CBC
MCH: 27.7 pg (ref 26.0–34.0)
MCHC: 33.5 g/dL (ref 30.0–36.0)
MCV: 82.6 fL (ref 78.0–100.0)
Platelets: 228 10*3/uL (ref 150–400)
RDW: 14 % (ref 11.5–15.5)

## 2012-09-21 LAB — IRON AND TIBC
Saturation Ratios: 6 % — ABNORMAL LOW (ref 20–55)
UIBC: 280 ug/dL (ref 125–400)

## 2012-09-21 LAB — BASIC METABOLIC PANEL
CO2: 27 mEq/L (ref 19–32)
Calcium: 9.2 mg/dL (ref 8.4–10.5)
Creatinine, Ser: 0.69 mg/dL (ref 0.50–1.35)
GFR calc Af Amer: >90 mL/min (ref >90)
GFR calc non Af Amer: >90 mL/min (ref >90)
Sodium: 136 mEq/L (ref 135–145)

## 2012-09-21 LAB — HEMOGLOBIN A1C: Hgb A1c MFr Bld: 5.5 % (ref <5.7)

## 2012-09-21 LAB — VITAMIN B12: Vitamin B-12: 605 pg/mL (ref 211–911)

## 2012-09-21 MED ORDER — IOHEXOL 300 MG/ML  SOLN
100.0000 mL | Freq: Once | INTRAMUSCULAR | Status: AC | PRN
  Administered 2012-09-21: 100 mL via INTRAVENOUS

## 2012-09-21 MED ORDER — POTASSIUM CHLORIDE CRYS ER 20 MEQ PO TBCR
40.0000 meq | EXTENDED_RELEASE_TABLET | Freq: Every day | ORAL | Status: AC
  Administered 2012-09-21 – 2012-09-22 (×2): 40 meq via ORAL
  Filled 2012-09-21: qty 2

## 2012-09-21 MED ORDER — POTASSIUM CHLORIDE CRYS ER 20 MEQ PO TBCR
EXTENDED_RELEASE_TABLET | ORAL | Status: AC
  Filled 2012-09-21: qty 2

## 2012-09-21 NOTE — Progress Notes (Signed)
Subjective:    Patient states his right leg pain is modestly improved this AM. No CP/SOB. No other complaints.   Interval Events: No acute events. LE doppler negative for DVT in R leg.   Objective:    Vital Signs:   Temp:  [98.3 F (36.8 C)-99.4 F (37.4 C)] 98.5 F (36.9 C) (06/11 0535) Pulse Rate:  [72-81] 79 (06/11 0535) Resp:  [17-20] 17 (06/11 0535) BP: (110-145)/(50-85) 138/57 mmHg (06/11 0535) SpO2:  [93 %-97 %] 97 % (06/11 0535) Weight:  [360 lb 0.2 oz (163.3 kg)-384 lb 3.2 oz (174.272 kg)] 362 lb 10.5 oz (164.5 kg) (06/11 0915) Last BM Date: 09/19/12  24-hour weight change: Weight change:   Intake/Output:   Intake/Output Summary (Last 24 hours) at 09/21/12 1051 Last data filed at 09/21/12 0500  Gross per 24 hour  Intake   1920 ml  Output      4 ml  Net   1916 ml      Physical Exam: General: Vital signs reviewed and noted. Well-developed, well-nourished, in no acute distress; alert, appropriate and cooperative throughout examination.  Lungs:  Normal respiratory effort. Clear to auscultation BL without crackles or wheezes.  Heart: RRR. S1 and S2 normal without gallop, murmur, or rubs.  Abdomen:  BS normoactive. Soft, Nondistended, non-tender.  No masses or organomegaly.  Extremities: RLE edema, erythema, and TTP from the R ankle to lower R thigh. Skin changes of BLE consistent with chronic venous insufficiency. Small healing ulcer of R anterior tibia.     Labs:  Basic Metabolic Panel:  Recent Labs Lab 09/20/12 0610 09/20/12 0620 09/20/12 1306 09/20/12 2006 09/21/12 0945  NA 133* 138  --  138 136  K 2.9* 2.9*  --  3.2* 3.9  CL 98 97  --  98 100  CO2 25  --   --  31 27  GLUCOSE 121* 124*  --  100* 111*  BUN 6 4*  --  6 6  CREATININE 0.62 0.90  --  0.73 0.69  CALCIUM 9.1  --   --  9.2 9.2  MG  --   --  2.1  --   --     Liver Function Tests:  Recent Labs Lab 09/20/12 0610  AST 17  ALT 15  ALKPHOS 71  BILITOT 0.5  PROT 7.8  ALBUMIN  3.0*   CBC:  Recent Labs Lab 09/20/12 0610 09/20/12 0620 09/21/12 0455  WBC 10.8*  --  9.4  NEUTROABS 7.9*  --   --   HGB 12.2* 12.6* 10.7*  HCT 35.8* 37.0* 31.9*  MCV 82.5  --  82.6  PLT 234  --  228    CBG:  Recent Labs Lab 09/20/12 0849  GLUCAP 104*    Imaging: Dg Foot Complete Right  09/20/2012   *RADIOLOGY REPORT*  Clinical Data: Wound in the medial aspect of the right foot for 3 months.  Redness and swelling.  Symptoms are worsening.  RIGHT FOOT COMPLETE - 3+ VIEW  Comparison: 07/01/2012  Findings: No evidence of acute fracture or subluxation of the right foot.  No focal bone lesion or bone destruction.  No cortical erosion or bone loss to suggest osteomyelitis.  Small Achilles spur of the calcaneus.  Mild degenerative changes in the ankle joint. Dorsal soft tissue swelling.  No radiopaque soft tissue foreign bodies or gas collections.  IMPRESSION: No acute bony abnormalities.  Dorsal soft tissue swelling.  No radiographic evidence of osteomyelitis.   Original Report Authenticated  By: Burman Nieves, M.D.       Medications:    Infusions:    Scheduled Medications: . aspirin EC  81 mg Oral Daily  . enoxaparin (LOVENOX) injection  80 mg Subcutaneous Q24H  . folic acid  1 mg Oral Daily  . methadone  170 mg Oral Daily  . multivitamin with minerals  1 tablet Oral Daily  . nicotine  21 mg Transdermal Daily  . potassium chloride  40 mEq Oral Daily  . sodium chloride  3 mL Intravenous Q12H  . thiamine  100 mg Oral Daily  . vancomycin  1,500 mg Intravenous Q12H    PRN Medications: sodium chloride, acetaminophen, acetaminophen, morphine injection, ondansetron (ZOFRAN) IV, ondansetron, sodium chloride   Assessment/ Plan:   [name] is a 52 year old man who presents to the [facility] ED with right leg cellulitis and a superficial venous stasis ulcer.   RLE cellulitis - pt has RLE edema, erythema, and increased warmth which extends to slightly above the knee. Purulence is  noted, which raises the concern for MRSA infection. Erythema appears somewhat improved since admission but pt continues to complain of pain in the RLE.  - cont vanc per pharmacy  - leg elevation  - morphine PRN - methadone 170mg  daily (home regimen) - check blood cultures if pt develops fever - f/u wound cx results   Right medial malleolus venous stasis ulcer - wound care has been consulted and no indication for debridement is present at this time.   - continue to monitor - continue wound care  Hypokalemia - Resolved after repletion with k-dur.  K = 3.9 this AM (2.9 on admission). Unclear etiology.   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 He denies blood in his stool or any weakness or fatigue. Ferritin = 362, consistent with AOCD--likely secondary to patient's significant venous stasis disease.  - repeat CBC tomorrow AM  Chronic methadone treatment: He states that he was a previous IV drug user and has been followed in a methadone treatment clinic for at least the last 15 years. He followes with [facility] in [place], [place]. I called and spoke with the facility and they confirmed that he is on chronic methadone 170 mg daily. He states that he took his medication the morning prior to admission so we will continue it starting tomorrow. The phone number of the facility is [phone]. They are aware that he is admitted and will be receiving IV pain medication during his stay.    Hyperglycemia - His CBG on admission was 124 and he has a family history of diabetes. A1c = 5.5, however this could be falsely low given pt's AOCD and rapid RBC turnover.  - cont to monitor CBGs    DVT PPX - lovenox  CODE STATUS - full  CONSULTS PLACED - N/A  DISPO - Disposition is deferred at this time, awaiting improvement of current medical problems.   Anticipated discharge in approximately 1-2 day(s).   The patient does not have a current PCP (No  primary provider on file.) and does need an [facility] hospital follow-up appointment after discharge.    Is the [facility] hospital follow-up appointment a one-time only appointment? yes.  Does the patient have transportation limitations that hinder transportation to clinic appointments? unknown   SERVICE NEEDED AT DISCHARGE - TO BE DETERMINED DURING HOSPITAL COURSE         Y = Yes, Blank = No PT:   OT:  RN:   Equipment:   Other:      Length of Stay: 1 day(s)   Signed: Elfredia Nevins, MD  PGY-1, Internal Medicine Resident Pager: [phone] (7AM-5PM) 09/21/2012, 10:51 AM

## 2012-09-21 NOTE — Progress Notes (Signed)
Internal Medicine Attending  Date: 09/21/2012  Patient name: [name] Medical record number: [id number] Date of birth: July 28, 1960 Age: 52 y.o. Gender: male  I saw and evaluated the patient on AM rounds and discussed his care with resident Dr. Lavena Bullion. I reviewed the resident's note by Dr. Lavena Bullion and I agree with the resident's findings and plans as documented in his note, with the following additional comments.  Patient's right leg remains markedly swollen and tender, with erythema, warmth, and induration extending into his thigh and foot.  Plan is to continue IV vancomycin; obtain MRI scan of right lower extremity.

## 2012-09-21 NOTE — ED Provider Notes (Signed)
Medical screening examination/treatment/procedure(s) were performed by non-physician practitioner and as supervising physician I was immediately available for consultation/collaboration.  Maci Eickholt M Henry Demeritt, MD 09/21/12 2047 

## 2012-09-22 DIAGNOSIS — M79609 Pain in unspecified limb: Secondary | ICD-10-CM

## 2012-09-22 DIAGNOSIS — I8310 Varicose veins of unspecified lower extremity with inflammation: Secondary | ICD-10-CM

## 2012-09-22 DIAGNOSIS — L97909 Non-pressure chronic ulcer of unspecified part of unspecified lower leg with unspecified severity: Secondary | ICD-10-CM

## 2012-09-22 LAB — VANCOMYCIN, TROUGH: Vancomycin Tr: 5.5 ug/mL — ABNORMAL LOW (ref 10.0–20.0)

## 2012-09-22 MED ORDER — VANCOMYCIN HCL 10 G IV SOLR
1750.0000 mg | Freq: Three times a day (TID) | INTRAVENOUS | Status: DC
  Administered 2012-09-22 – 2012-09-23 (×3): 1750 mg via INTRAVENOUS
  Filled 2012-09-22 (×6): qty 1750

## 2012-09-22 NOTE — Progress Notes (Signed)
Internal Medicine Attending  Date: 09/22/2012  Patient name: [name] Medical record number: [id number] Date of birth: 03-Aug-1960 Age: 52 y.o. Gender: male  I saw and evaluated the patient on AM rounds with house staff. I reviewed the resident's note by Dr. Lavena Bullion and I agree with the resident's findings and plans as documented in his note.

## 2012-09-22 NOTE — Progress Notes (Signed)
Subjective:    Pt states he feels well today. He admits to persistent RLE pain, however it appears it is nearing his baseline. Denies fever/chills. No new complaints.   Interval Events: Pt underwent CT with contrast of the RLE which was unrevealing of abscess of osteomyelitis.    Objective:    Vital Signs:   Temp:  [98 F (36.7 C)-99.4 F (37.4 C)] 98 F (36.7 C) (06/12 0528) Pulse Rate:  [75-83] 75 (06/12 0528) Resp:  [20] 20 (06/12 0528) BP: (110-128)/(64-80) 110/64 mmHg (06/12 0528) SpO2:  [95 %-99 %] 96 % (06/12 0528) Weight:  [362 lb 10.5 oz (164.5 kg)-365 lb 1.3 oz (165.6 kg)] 365 lb 1.3 oz (165.6 kg) (06/12 0528) Last BM Date: 09/21/12  24-hour weight change: Weight change: -21 lb 8.7 oz (-9.772 kg)  Intake/Output:   Intake/Output Summary (Last 24 hours) at 09/22/12 0849 Last data filed at 09/22/12 0528  Gross per 24 hour  Intake   1780 ml  Output      2 ml  Net   1778 ml      Physical Exam: General:  Vital signs reviewed and noted. Well-developed, well-nourished, in no acute distress; alert, appropriate and cooperative throughout examination.   Lungs:  Normal respiratory effort. Clear to auscultation BL without crackles or wheezes.   Heart:  RRR. S1 and S2 normal without gallop, murmur, or rubs.   Abdomen:  BS normoactive. Soft, Nondistended, non-tender. No masses or organomegaly.   Extremities:  RLE edema, erythema, and TTP from the R ankle to lower R thigh. Skin changes of BLE consistent with chronic venous insufficiency. Small healing ulcer of R anterior tibia. Dressing over R medial malleolus c/d/i.     Labs:  Basic Metabolic Panel:  Recent Labs Lab 09/20/12 0610 09/20/12 0620 09/20/12 1306 09/20/12 2006 09/21/12 0945  NA 133* 138  --  138 136  K 2.9* 2.9*  --  3.2* 3.9  CL 98 97  --  98 100  CO2 25  --   --  31 27  GLUCOSE 121* 124*  --  100* 111*  BUN 6 4*  --  6 6  CREATININE 0.62 0.90  --  0.73 0.69  CALCIUM 9.1  --   --  9.2 9.2  MG   --   --  2.1  --   --     Liver Function Tests:  Recent Labs Lab 09/20/12 0610  AST 17  ALT 15  ALKPHOS 71  BILITOT 0.5  PROT 7.8  ALBUMIN 3.0*   CBC:  Recent Labs Lab 09/20/12 0610 09/20/12 0620 09/21/12 0455  WBC 10.8*  --  9.4  NEUTROABS 7.9*  --   --   HGB 12.2* 12.6* 10.7*  HCT 35.8* 37.0* 31.9*  MCV 82.5  --  82.6  PLT 234  --  228    CBG:  Recent Labs Lab 09/20/12 0849  GLUCAP 104*    Microbiology: Results for orders placed during the hospital encounter of 09/20/12  CULTURE, BLOOD (ROUTINE X 2)     Status: None   Collection Time    09/21/12  3:57 PM      Result Value Range Status   Specimen Description BLOOD LEFT HAND   Final   Special Requests BOTTLES DRAWN AEROBIC ONLY 3CC   Final   Culture  Setup Time 09/21/2012 22:01   Final   Culture     Final   Value:        BLOOD  CULTURE RECEIVED NO GROWTH TO DATE CULTURE WILL BE HELD FOR 5 DAYS BEFORE ISSUING A FINAL NEGATIVE REPORT   Report Status PENDING   Incomplete  CULTURE, BLOOD (ROUTINE X 2)     Status: None   Collection Time    09/21/12  4:05 PM      Result Value Range Status   Specimen Description BLOOD RIGHT ARM   Final   Special Requests BOTTLES DRAWN AEROBIC ONLY 2CC   Final   Culture  Setup Time 09/21/2012 22:02   Final   Culture     Final   Value:        BLOOD CULTURE RECEIVED NO GROWTH TO DATE CULTURE WILL BE HELD FOR 5 DAYS BEFORE ISSUING A FINAL NEGATIVE REPORT   Report Status PENDING   Incomplete    Imaging: Ct Tibia Fibula Right W Contrast  09/21/2012   *RADIOLOGY REPORT*  Clinical Data: Right lower extremity cellulitis.  CT OF THE RIGHT TIBIA AND FIBULA WITH CONTRAST  Technique:  Multidetector CT imaging of the bilateral lower extremities was performed according to the standard protocol following intravenous contrast administration. Sagittal and coronal plane reformatted images were reconstructed from the axial CT dat  Contrast: OMNIPAQUE IOHEXOL 300 MG/ML  SOLN  Comparison:  None.  Findings: Extensive subcutaneous edema and stranding is noted beginning in the distal thigh and continuing to the level of the foot and ankle.  There is no evidence of focal abscess.  No bony changes are seen to suggest osteomyelitis.  No evidence of fracture, bony lesion or soft tissue mass.  Extensive superficial varicosities are identified beginning in the medial aspect of the distal thigh and crossing the knee.  Multiple calf varicosities are also identified.  These varicosities ultimately communicate with an enlarged great saphenous vein which is likely incompetent.  There is no evidence of superficial venous thrombus.  No abnormal fluid collections are identified.  IMPRESSION: Changes related to known extensive cellulitis of the right lower leg with extensive subcutaneous edema present.  No focal abscess or evidence of osteomyelitis by CT.  Evidence of superficial venous insufficiency with extensive superficial varicose veins noted.   Original Report Authenticated By: Irish Lack, M.D.       Medications:    Infusions:    Scheduled Medications: . aspirin EC  81 mg Oral Daily  . enoxaparin (LOVENOX) injection  80 mg Subcutaneous Q24H  . folic acid  1 mg Oral Daily  . methadone  170 mg Oral Daily  . multivitamin with minerals  1 tablet Oral Daily  . nicotine  21 mg Transdermal Daily  . potassium chloride  40 mEq Oral Daily  . sodium chloride  3 mL Intravenous Q12H  . thiamine  100 mg Oral Daily  . vancomycin  1,750 mg Intravenous Q8H    PRN Medications: sodium chloride, acetaminophen, acetaminophen, morphine injection, ondansetron (ZOFRAN) IV, ondansetron, sodium chloride   Assessment/ Plan:   Mr. Entwistle is a 52 year old man who presents to the [facility] ED with right leg cellulitis and a superficial venous stasis ulcer.   RLE cellulitis - feeling somewhat better today with moderate improvement in appearance of cellulitis. No fevers overnight. Blood cultures NGTD.  - cont vanc per  pharmacy  - leg elevation  - morphine PRN  - methadone 170mg  daily (home regimen)  - check blood cultures if pt develops fever   Right medial malleolus venous stasis ulcer - wound care has been consulted and no indication for debridement is  present at this time. 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 He denies blood in his stool or any weakness or fatigue. Ferritin = 362, consistent with AOCD--likely secondary to patient's significant venous stasis disease. 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 He followes with [facility] in [place], [place]. I called and spoke with the facility and they confirmed that he is on chronic methadone 170 mg daily. He states that he took his medication the morning prior to admission so we will continue it starting tomorrow. The phone number of the facility is [phone]. They are aware that he is admitted and will be receiving IV pain medication during his stay.   Hyperglycemia - His CBG on admission was 124 and he has a family history of diabetes. A1c = 5.5, however this could be falsely low given pt's AOCD and rapid RBC turnover. CBGs since admission have been <120.  - cont to monitor CBGs  DVT PPX - lovenox  CODE STATUS - full CONSULTS PLACED - N/A DISPO - Disposition is deferred at this time, awaiting improvement of current medical problems.  Anticipated discharge in approximately 1-2 day(s).  The patient does not have a current PCP (No primary provider on file.) and does need an [facility] hospital follow-up appointment after discharge.  Is the [facility] hospital follow-up appointment a one-time only appointment? yes.  Does the patient have transportation limitations that hinder transportation to clinic appointments?  unknown SERVICE NEEDED AT DISCHARGE - TO BE DETERMINED DURING HOSPITAL COURSE  Y = Yes, Blank = No  PT:    OT:    RN:    Equipment:    Other:      Length of Stay: 2 day(s)   Signed: Elfredia Nevins, MD  PGY-1, Internal Medicine Resident Pager: [phone] (7AM-5PM) 09/22/2012, 8:49 AM

## 2012-09-22 NOTE — Progress Notes (Signed)
ANTIBIOTIC CONSULT NOTE - Follow-Up  Pharmacy Consult for Vancomycin Indication: Right leg cellulitis  No Known Allergies  Patient Measurements: Height: 6' 0.44" (184 cm) Weight: 362 lb 10.5 oz (164.5 kg) IBW/kg (Calculated) : 78.61  Vital Signs: Temp: 99.4 F (37.4 C) (06/11 2140) Temp src: Oral (06/11 2140) BP: 128/80 mmHg (06/11 2140) Pulse Rate: 78 (06/11 2140) Intake/Output from previous day: 06/11 0701 - 06/12 0700 In: 1120 [P.O.:960; IV Piggyback:160] Out: -  Intake/Output from this shift: Total I/O In: 400 [P.O.:240; IV Piggyback:160] Out: -   Labs:  Recent Labs  09/20/12 0610 09/20/12 0620 09/20/12 2006 09/21/12 0455 09/21/12 0945  WBC 10.8*  --   --  9.4  --   HGB 12.2* 12.6*  --  10.7*  --   PLT 234  --   --  228  --   CREATININE 0.62 0.90 0.73  --  0.69   Estimated Creatinine Clearance: 176.6 ml/min (by C-G formula based on Cr of 0.69).  Recent Labs  09/22/12 0110  VANCOTROUGH 5.5*     Microbiology: No results found for this or any previous visit (from the past 720 hour(s)).  Assessment: 52 y.o. M on Vancomycin (Day #3) for RLE cellulitis. Vancomycin trough 5.5 mcg/ml (subtherapeutic) on 1500 mg IV q12h. Per MD note, erythema somewhate improved but pt still c/o lots of pain. Bld and wound cx pending. Pt afeb, wbc wnl. SCr remains stable, UOP not being recorded accurately.  Goal of Therapy:  Vancomycin trough level 10-15 mcg/ml  Plan:  1. Change Vancomycin to 1750 mg IV every 8 hours 2. Will continue to follow renal function, culture results, LOT, trough at new Css, and antibiotic de-escalation plans   Christoper Fabian, PharmD, BCPS Clinical pharmacist, pager [phone] 09/22/2012 2:02 AM

## 2012-09-23 LAB — VANCOMYCIN, TROUGH: Vancomycin Tr: 23.6 ug/mL — ABNORMAL HIGH (ref 10.0–20.0)

## 2012-09-23 MED ORDER — SULFAMETHOXAZOLE-TMP DS 800-160 MG PO TABS
1.0000 | ORAL_TABLET | Freq: Two times a day (BID) | ORAL | Status: DC
  Administered 2012-09-23 – 2012-09-24 (×3): 1 via ORAL
  Filled 2012-09-23 (×4): qty 1

## 2012-09-23 MED ORDER — HYDROCODONE-ACETAMINOPHEN 5-325 MG PO TABS
1.0000 | ORAL_TABLET | ORAL | Status: DC | PRN
  Filled 2012-09-23: qty 2

## 2012-09-23 MED ORDER — AMOXICILLIN 500 MG PO CAPS
500.0000 mg | ORAL_CAPSULE | Freq: Three times a day (TID) | ORAL | Status: DC
  Administered 2012-09-23 – 2012-09-24 (×4): 500 mg via ORAL
  Filled 2012-09-23 (×6): qty 1

## 2012-09-23 NOTE — Progress Notes (Signed)
Internal Medicine Attending  Date: 09/23/2012  Patient name: [name] Medical record number: [id number] Date of birth: Aug 30, 1960 Age: 52 y.o. Gender: male  I saw and evaluated the patient. I reviewed the resident's note by Dr. Lavena Bullion and I agree with the resident's findings and plans as documented in his note.  Dr. Rogelia Boga will cover as the on-call attending physician this weekend, and Dr. Dalphine Handing will take over as attending physician on Monday 09/26/2012.

## 2012-09-23 NOTE — Care Management Note (Signed)
  Page 2 of 2   09/23/2012     11:59:16 AM   CARE MANAGEMENT NOTE 09/23/2012  Patient:  [name], [name]   Account Number:  [id number]  Date Initiated:  09/23/2012  Documentation initiated by:  Ronny Flurry  Subjective/Objective Assessment:     Action/Plan:   Anticipated DC Date:  09/23/2012   Anticipated DC Plan:    In-house referral  Financial Counselor      DC Planning Services  [facility] Program      Choice offered to / List presented to:             Status of service:   Medicare Important Message given?   (If response is "NO", the following Medicare IM given date fields will be blank) Date Medicare IM given:   Date Additional Medicare IM given:    Discharge Disposition:    Per UR Regulation:    If discussed at Long Length of Stay Meetings, dates discussed:    Comments:  09-23-12 Referral for Wound Center. Spoke with Onalee Hua at [facility]  [address] [place], [place] [address]  [phone]   Center does accept uninsured patient's . They do refer patient to patient accounting to see if they are eligble for any financial assistance.  Cost is unkown until treatment plan determined .  Patient has appointment on Tuesday July 1 at 1 pm , patient needs to arrive at 1245 . On discharge instructions .  Entered in [facility] program  Above explained to patient . Patient voiced understanding  Ronny Flurry RN BSN [phone]

## 2012-09-23 NOTE — Consult Note (Signed)
WOC consult Note  Reason for Consult: re-Consult requested for re-evaluation of right foot wound.  Right leg with generalized edema and erythremia extending from foot to upper thigh, however the markings from the bedside staff indicate the erythema is improving.  Very intense red/purple erythema in the pretibial and malleolar region. Wound type: Full thickness to right inner foot near ankle, has some pealing of the skin at the distal edges. Two area centrally that are open and draining  Wound bed: mostly re-epithelialized but with two small open areas that are open and draining  Drainage (amount, consistency, odor) no odor, moderate serous drainage  Periwound: dry peeling skin to edges easily removed with scissors again today.  Dressing procedure/placement/frequency: add silver hydrofiber to the open areas top with foam dressing to absorb drainage and promote healing.  Pt has FU appt with [facility] July 1st. Re consult if needed, will not follow at this time. Thanks  Cayleigh Paull [employer], CWOCN [phone])

## 2012-09-23 NOTE — Progress Notes (Signed)
Pharmacy: Vancomycin Follow-up  O: Vancomycin trough: 23.4 mcg/ml  A: Vancomycin trough this morning was SUPRAtherapeutic (23.4 mcg/ml, goal of 10-15 mcg/ml). This would have required a dose adjustment -- however it is noted that the physician has discontinued Vancomycin and switched the patient to oral Bactrim.  P: 1. No adjustments needed at this time -- Vanc d/ced 2. Will discontinue Vancomycin per pharmacy protocol  Georgina Pillion, PharmD, BCPS Clinical Pharmacist Pager: [phone] 09/23/2012 11:04 AM

## 2012-09-23 NOTE — Progress Notes (Signed)
Subjective:    Patient feeling better today. States his RLE swelling is much improved. Denies fever/chills. No new complaints.   Interval Events: No acute events.    Objective:    Vital Signs:   Temp:  [98.1 F (36.7 C)-98.5 F (36.9 C)] 98.1 F (36.7 C) (06/13 0540) Pulse Rate:  [65-69] 65 (06/13 0540) Resp:  [17-18] 18 (06/13 0540) BP: (122-151)/(57-70) 124/70 mmHg (06/13 0540) SpO2:  [98 %-99 %] 99 % (06/13 0540) Weight:  [365 lb 8.4 oz (165.8 kg)] 365 lb 8.4 oz (165.8 kg) (06/13 0540) Last BM Date: 09/21/12  24-hour weight change: Weight change: 2 lb 13.9 oz (1.3 kg)  Intake/Output:   Intake/Output Summary (Last 24 hours) at 09/23/12 0902 Last data filed at 09/23/12 0600  Gross per 24 hour  Intake   1500 ml  Output      4 ml  Net   1496 ml      Physical Exam: General: Vital signs reviewed and noted. Well-developed, well-nourished, in no acute distress; alert, appropriate and cooperative throughout examination.  Lungs: Normal respiratory effort. Clear to auscultation BL without crackles or wheezes.  Heart: RRR. S1 and S2 normal without gallop, murmur, or rubs.  Abdomen: BS normoactive. Soft, Nondistended, non-tender. No masses or organomegaly.  Extremities: RLE edema, erythema, and TTP from the R ankle to lower R thigh, all with interval improvement. Skin changes of BLE consistent with chronic venous insufficiency. Small healing ulcer of R anterior tibia. Dressing over R medial malleolus c/d/i.    Labs:  Basic Metabolic Panel:  Recent Labs Lab 09/20/12 0610 09/20/12 0620 09/20/12 1306 09/20/12 2006 09/21/12 0945  NA 133* 138  --  138 136  K 2.9* 2.9*  --  3.2* 3.9  CL 98 97  --  98 100  CO2 25  --   --  31 27  GLUCOSE 121* 124*  --  100* 111*  BUN 6 4*  --  6 6  CREATININE 0.62 0.90  --  0.73 0.69  CALCIUM 9.1  --   --  9.2 9.2  MG  --   --  2.1  --   --     Liver Function Tests:  Recent Labs Lab 09/20/12 0610  AST 17  ALT 15  ALKPHOS 71   BILITOT 0.5  PROT 7.8  ALBUMIN 3.0*   CBC:  Recent Labs Lab 09/20/12 0610 09/20/12 0620 09/21/12 0455  WBC 10.8*  --  9.4  NEUTROABS 7.9*  --   --   HGB 12.2* 12.6* 10.7*  HCT 35.8* 37.0* 31.9*  MCV 82.5  --  82.6  PLT 234  --  228    CBG:  Recent Labs Lab 09/20/12 0849  GLUCAP 104*    Microbiology: Results for orders placed during the hospital encounter of 09/20/12  CULTURE, BLOOD (ROUTINE X 2)     Status: None   Collection Time    09/21/12  3:57 PM      Result Value Range Status   Specimen Description BLOOD LEFT HAND   Final   Special Requests BOTTLES DRAWN AEROBIC ONLY 3CC   Final   Culture  Setup Time 09/21/2012 22:01   Final   Culture     Final   Value:        BLOOD CULTURE RECEIVED NO GROWTH TO DATE CULTURE WILL BE HELD FOR 5 DAYS BEFORE ISSUING A FINAL NEGATIVE REPORT   Report Status PENDING   Incomplete  CULTURE, BLOOD (ROUTINE X  2)     Status: None   Collection Time    09/21/12  4:05 PM      Result Value Range Status   Specimen Description BLOOD RIGHT ARM   Final   Special Requests BOTTLES DRAWN AEROBIC ONLY 2CC   Final   Culture  Setup Time 09/21/2012 22:02   Final   Culture     Final   Value:        BLOOD CULTURE RECEIVED NO GROWTH TO DATE CULTURE WILL BE HELD FOR 5 DAYS BEFORE ISSUING A FINAL NEGATIVE REPORT   Report Status PENDING   Incomplete    Imaging: Ct Tibia Fibula Right W Contrast  09/21/2012   *RADIOLOGY REPORT*  Clinical Data: Right lower extremity cellulitis.  CT OF THE RIGHT TIBIA AND FIBULA WITH CONTRAST  Technique:  Multidetector CT imaging of the bilateral lower extremities was performed according to the standard protocol following intravenous contrast administration. Sagittal and coronal plane reformatted images were reconstructed from the axial CT dat  Contrast: OMNIPAQUE IOHEXOL 300 MG/ML  SOLN  Comparison: None.  Findings: Extensive subcutaneous edema and stranding is noted beginning in the distal thigh and continuing to the  level of the foot and ankle.  There is no evidence of focal abscess.  No bony changes are seen to suggest osteomyelitis.  No evidence of fracture, bony lesion or soft tissue mass.  Extensive superficial varicosities are identified beginning in the medial aspect of the distal thigh and crossing the knee.  Multiple calf varicosities are also identified.  These varicosities ultimately communicate with an enlarged great saphenous vein which is likely incompetent.  There is no evidence of superficial venous thrombus.  No abnormal fluid collections are identified.  IMPRESSION: Changes related to known extensive cellulitis of the right lower leg with extensive subcutaneous edema present.  No focal abscess or evidence of osteomyelitis by CT.  Evidence of superficial venous insufficiency with extensive superficial varicose veins noted.   Original Report Authenticated By: Irish Lack, M.D.       Medications:    Infusions:    Scheduled Medications: . amoxicillin  500 mg Oral Q8H  . aspirin EC  81 mg Oral Daily  . enoxaparin (LOVENOX) injection  80 mg Subcutaneous Q24H  . folic acid  1 mg Oral Daily  . methadone  170 mg Oral Daily  . multivitamin with minerals  1 tablet Oral Daily  . nicotine  21 mg Transdermal Daily  . sodium chloride  3 mL Intravenous Q12H  . sulfamethoxazole-trimethoprim  1 tablet Oral Q12H  . thiamine  100 mg Oral Daily    PRN Medications: sodium chloride, acetaminophen, acetaminophen, morphine injection, ondansetron (ZOFRAN) IV, ondansetron, sodium chloride   Assessment/ Plan:   Mr. Bailey is a 52 year old man who presents to the [facility] ED with right leg cellulitis and a superficial venous stasis ulcer.   RLE cellulitis - Symptoms of swelling/pain are improved. Denies fever/chills. Blood cultures remain NGTD. CT with contrast of RLE was negative for abscess or osteomyelitis. Will transition to PO antibiotics in anticipation of discharge.  - d/c vanc - amoxicillin 500mg  tid -  bactrim DS bid - leg elevation  - norco PRN - methadone 170mg  daily (home regimen)   Right medial malleolus venous stasis ulcer - wound care has been consulted and no indication for debridement is present at this time.  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 He denies blood in his stool or any weakness or fatigue. Ferritin = 362, consistent with AOCD--likely secondary to patient's significant venous stasis disease.   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 He followes with [facility] in [place], [place]. I called and spoke with the facility and they confirmed that he is on chronic methadone 170 mg daily. He states that he took his medication the morning prior to admission so we will continue it starting tomorrow. The phone number of the facility is [phone]. They are aware that he is admitted and will be receiving IV pain medication during his stay.   DVT PPX - lovenox  CODE STATUS - full  CONSULTS PLACED - N/A  DISPO - Likely discharge home today on oral antibiotics and with outpatient wound care going forward.  The patient does not have a current PCP (No primary provider on file.) and does need an [facility] hospital follow-up appointment after discharge.  Is the [facility] hospital follow-up appointment a one-time only appointment? yes.  Does the patient have transportation limitations that hinder transportation to clinic appointments? unknown  SERVICE NEEDED AT DISCHARGE - TO BE DETERMINED DURING HOSPITAL COURSE  Y = Yes, Blank = No  PT:    OT:    RN:    Equipment:    Other:        Length of Stay: 3 day(s)   Signed: Elfredia Nevins, MD  PGY-1, Internal Medicine Resident Pager: [phone]  (7AM-5PM) 09/23/2012, 9:02 AM

## 2012-09-24 LAB — BASIC METABOLIC PANEL
CO2: 28 mEq/L (ref 19–32)
Calcium: 9 mg/dL (ref 8.4–10.5)
Creatinine, Ser: 0.77 mg/dL (ref 0.50–1.35)
GFR calc non Af Amer: >90 mL/min (ref >90)

## 2012-09-24 LAB — CBC
MCH: 28.4 pg (ref 26.0–34.0)
MCV: 83.9 fL (ref 78.0–100.0)
Platelets: 329 10*3/uL (ref 150–400)
RDW: 14.1 % (ref 11.5–15.5)
WBC: 7.3 10*3/uL (ref 4.0–10.5)

## 2012-09-24 MED ORDER — AMOXICILLIN 500 MG PO CAPS: 500.0000 mg | ORAL_CAPSULE | Freq: Three times a day (TID) | ORAL | Status: DC

## 2012-09-24 MED ORDER — SULFAMETHOXAZOLE-TMP DS 800-160 MG PO TABS: 1.0000 | ORAL_TABLET | Freq: Two times a day (BID) | ORAL | Status: DC

## 2012-09-24 MED ORDER — METHADONE HCL 10 MG PO TABS: 170.0000 mg | ORAL_TABLET | Freq: Every day | ORAL | Status: DC

## 2012-09-24 MED ORDER — ASPIRIN 81 MG PO TBEC: 81.0000 mg | DELAYED_RELEASE_TABLET | Freq: Every day | ORAL | Status: DC

## 2012-09-24 NOTE — Progress Notes (Signed)
Subjective:    Pt feeling well today. Denies fever/chills. RLE swelling continues to improve. States he wants to go home.   Interval Events: No acute events.    Objective:    Vital Signs:   Temp:  [98 F (36.7 C)-98.8 F (37.1 C)] 98 F (36.7 C) (06/14 0514) Pulse Rate:  [68-100] 68 (06/14 0514) Resp:  [16-18] 18 (06/14 0514) BP: (121-139)/(58-63) 121/63 mmHg (06/14 0514) SpO2:  [96 %-98 %] 98 % (06/14 0514) Last BM Date: 09/22/12  24-hour weight change: Weight change:   Intake/Output:   Intake/Output Summary (Last 24 hours) at 09/24/12 0748 Last data filed at 09/23/12 2140  Gross per 24 hour  Intake    603 ml  Output      0 ml  Net    603 ml      Physical Exam: General: Vital signs reviewed and noted. Well-developed, well-nourished, in no acute distress; alert, appropriate and cooperative throughout examination.  Lungs: Normal respiratory effort. Clear to auscultation BL without crackles or wheezes.  Heart: RRR. S1 and S2 normal without gallop, murmur, or rubs.  Abdomen: BS normoactive. Soft, Nondistended, non-tender. No masses or organomegaly.  Extremities: RLE edema, erythema, and TTP from the R ankle to lower R thigh, all with interval improvement (6/13 -> 6/14). Skin changes of BLE consistent with chronic venous insufficiency. Small healing ulcer of R anterior tibia. Dressing over R medial malleolus c/d/i.   Labs:  Basic Metabolic Panel:  Recent Labs Lab 09/20/12 0610 09/20/12 0620 09/20/12 1306 09/20/12 2006 09/21/12 0945 09/24/12 0535  NA 133* 138  --  138 136 133*  K 2.9* 2.9*  --  3.2* 3.9 4.3  CL 98 97  --  98 100 97  CO2 25  --   --  31 27 28   GLUCOSE 121* 124*  --  100* 111* 100*  BUN 6 4*  --  6 6 7   CREATININE 0.62 0.90  --  0.73 0.69 0.77  CALCIUM 9.1  --   --  9.2 9.2 9.0  MG  --   --  2.1  --   --   --     Liver Function Tests:  Recent Labs Lab 09/20/12 0610  AST 17  ALT 15  ALKPHOS 71  BILITOT 0.5  PROT 7.8  ALBUMIN 3.0*     CBC:  Recent Labs Lab 09/20/12 0610 09/20/12 0620 09/21/12 0455 09/24/12 0535  WBC 10.8*  --  9.4 7.3  NEUTROABS 7.9*  --   --   --   HGB 12.2* 12.6* 10.7* 11.8*  HCT 35.8* 37.0* 31.9* 34.9*  MCV 82.5  --  82.6 83.9  PLT 234  --  228 329    CBG:  Recent Labs Lab 09/20/12 0849  GLUCAP 104*    Microbiology: Results for orders placed during the hospital encounter of 09/20/12  CULTURE, BLOOD (ROUTINE X 2)     Status: None   Collection Time    09/21/12  3:57 PM      Result Value Range Status   Specimen Description BLOOD LEFT HAND   Final   Special Requests BOTTLES DRAWN AEROBIC ONLY 3CC   Final   Culture  Setup Time 09/21/2012 22:01   Final   Culture     Final   Value:        BLOOD CULTURE RECEIVED NO GROWTH TO DATE CULTURE WILL BE HELD FOR 5 DAYS BEFORE ISSUING A FINAL NEGATIVE REPORT   Report Status PENDING  Incomplete  CULTURE, BLOOD (ROUTINE X 2)     Status: None   Collection Time    09/21/12  4:05 PM      Result Value Range Status   Specimen Description BLOOD RIGHT ARM   Final   Special Requests BOTTLES DRAWN AEROBIC ONLY 2CC   Final   Culture  Setup Time 09/21/2012 22:02   Final   Culture     Final   Value:        BLOOD CULTURE RECEIVED NO GROWTH TO DATE CULTURE WILL BE HELD FOR 5 DAYS BEFORE ISSUING A FINAL NEGATIVE REPORT   Report Status PENDING   Incomplete      Medications:    Infusions:    Scheduled Medications: . amoxicillin  500 mg Oral Q8H  . aspirin EC  81 mg Oral Daily  . enoxaparin (LOVENOX) injection  80 mg Subcutaneous Q24H  . folic acid  1 mg Oral Daily  . methadone  170 mg Oral Daily  . multivitamin with minerals  1 tablet Oral Daily  . nicotine  21 mg Transdermal Daily  . sodium chloride  3 mL Intravenous Q12H  . sulfamethoxazole-trimethoprim  1 tablet Oral Q12H  . thiamine  100 mg Oral Daily    PRN Medications: sodium chloride, acetaminophen, acetaminophen, HYDROcodone-acetaminophen, ondansetron (ZOFRAN) IV, ondansetron,  sodium chloride   Assessment/ Plan:   [name] is a 52 year old man who [facility] to the [facility] ED with right leg cellulitis and a superficial venous stasis ulcer.   RLE cellulitis - Continuing improvement. Will discharge home today on amoxicillin and bactrim (total 14 day antibiotic course). - amoxicillin 500mg  tid  - bactrim DS bid  - leg elevation  - norco PRN  - methadone 170mg  daily (home regimen)   Right medial malleolus venous stasis ulcer - wound care has been consulted and no indication for debridement is present at this time.  He will follow-up with the wound care clinic after discharge.   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 He followes with [facility] in [place], [place]. I called and spoke with the facility and they confirmed that he is on chronic methadone 170 mg daily. He states that he took his medication the morning prior to admission so we will continue it starting tomorrow. The phone number of the facility is [phone]. They are aware that he is admitted and will be receiving IV pain medication during his stay.    DVT PPX - lovenox  CODE STATUS - full  CONSULTS PLACED - N/A  DISPO - Likely discharge home today on oral antibiotics and with outpatient wound care going forward.  The patient does not have a current PCP (No primary provider on file.) and does need an [facility] hospital follow-up appointment after discharge.  Is the [facility] hospital follow-up appointment a one-time only appointment? yes.  Does the patient have transportation limitations that hinder transportation to clinic appointments? unknown  SERVICE NEEDED AT DISCHARGE - TO BE DETERMINED DURING HOSPITAL COURSE Y = Yes, Blank = No  PT:    OT:    RN:    Equipment:    Other:       Length of Stay: 4 day(s)   Signed: Elfredia Nevins, MD    PGY-1, Internal Medicine Resident Pager: [phone] (7AM-5PM)  09/24/2012, 7:48 AM

## 2012-09-24 NOTE — Progress Notes (Signed)
Discharge instructions gone over with patient. Home medications gone over. Prescription given, other medications faxed in by doctor. Follow up appointments gone over and to be made. Diet , activity, and dressing care gone over. Patient told to complete regimen of antibiotics. Patient stated he had ampule supplies at home for changing the dressing. Patient did not have any questions and was discharged.

## 2012-09-27 LAB — CULTURE, BLOOD (ROUTINE X 2)
Culture: NO GROWTH
Culture: NO GROWTH

## 2012-09-29 NOTE — Discharge Summary (Signed)
Patient Name: [name]  MRN:  [id number]   DOB: [birth year]   PCP: No primary provider on file.         Date of Admission: 09/20/2012  Date of Discharge: 09/24/2012        Attending Physician: Dr. Rogelia Boga      DISCHARGE DIAGNOSES: RLE celllulitis  Chronic venous disease Right medial malleolus venous stasis ulcer AOCD  Chronic methadone treatment    DISPOSITION AND FOLLOW-UP: [name] is to follow-up with the listed providers as detailed below, at which time, the following should be addressed:   1. F/u on complaints associated with RLE cellultis and assess for improvement (to note, pt has marked asymmetry of the lower extremities at baseline, size RLE>LLE)  2. Provide pt with a prescription for compression stockings if cellulitis is improved/nearing resolution.  3. Labs / imaging needed at time of follow-up: N/A  4. Pending labs/ test needing follow-up: N/A    DISCHARGE INSTRUCTIONS:  Discharge Orders   Future Appointments Provider Department Dept Phone   09/30/2012 2:45 PM Bronson Curb, MD [facility] [phone]   Future Orders Complete By Expires     Diet - low sodium heart healthy  As directed     Increase activity slowly  As directed         DISCHARGE MEDICATIONS:   Medication List    TAKE these medications       amoxicillin 500 MG capsule  Commonly known as:  AMOXIL  Take 1 capsule (500 mg total) by mouth every 8 (eight) hours.     aspirin 81 MG EC tablet  Take 1 tablet (81 mg total) by mouth daily.     methadone 10 MG/ML solution  Commonly known as:  DOLOPHINE  Take 170 mg by mouth daily. Methadone clinic     methadone 10 MG tablet  Commonly known as:  DOLOPHINE  Take 17 tablets (170 mg total) by mouth daily.     sulfamethoxazole-trimethoprim 800-160 MG per tablet  Commonly known as:  BACTRIM DS  Take 1 tablet by mouth every 12 (twelve) hours.        PROCEDURES PERFORMED:  Ct Tibia Fibula Right W  Contrast  09/21/2012   *RADIOLOGY REPORT*  Clinical Data: Right lower extremity cellulitis.  CT OF THE RIGHT TIBIA AND FIBULA WITH CONTRAST  Technique:  Multidetector CT imaging of the bilateral lower extremities was performed according to the standard protocol following intravenous contrast administration. Sagittal and coronal plane reformatted images were reconstructed from the axial CT dat  Contrast: OMNIPAQUE IOHEXOL 300 MG/ML  SOLN  Comparison: None.  Findings: Extensive subcutaneous edema and stranding is noted beginning in the distal thigh and continuing to the level of the foot and ankle.  There is no evidence of focal abscess.  No bony changes are seen to suggest osteomyelitis.  No evidence of fracture, bony lesion or soft tissue mass.  Extensive superficial varicosities are identified beginning in the medial aspect of the distal thigh and crossing the knee.  Multiple calf varicosities are also identified.  These varicosities ultimately communicate with an enlarged great saphenous vein which is likely incompetent.  There is no evidence of superficial venous thrombus.  No abnormal fluid collections are identified.  IMPRESSION: Changes related to known extensive cellulitis of the right lower leg with extensive subcutaneous edema present.  No focal abscess or evidence of osteomyelitis by CT.  Evidence of superficial venous insufficiency with extensive superficial varicose veins noted.  Original Report Authenticated By: Irish Lack, M.D.   Dg Foot Complete Right  09/20/2012   *RADIOLOGY REPORT*  Clinical Data: Wound in the medial aspect of the right foot for 3 months.  Redness and swelling.  Symptoms are worsening.  RIGHT FOOT COMPLETE - 3+ VIEW  Comparison: 07/01/2012  Findings: No evidence of acute fracture or subluxation of the right foot.  No focal bone lesion or bone destruction.  No cortical erosion or bone loss to suggest osteomyelitis.  Small Achilles spur of the calcaneus.  Mild  degenerative changes in the ankle joint. Dorsal soft tissue swelling.  No radiopaque soft tissue foreign bodies or gas collections.  IMPRESSION: No acute bony abnormalities.  Dorsal soft tissue swelling.  No radiographic evidence of osteomyelitis.   Original Report Authenticated By: Burman Nieves, M.D.       ADMISSION DATA: H&P: [name] is a 52 year old man with a PMH significant for morbid obesity and venous stasis who presented to the [facility] ED complaining of right leg swelling, redness, and pain that started about 1 week prior. He states that about 1-2 months ago he got a wound on his medial right ankle that has steadily grown bigger over that time period and then about 1 week ago he started to notice increase swelling and redness that "creeped up my leg." He states that he has had problems like this in the past but this is the worst its been. He states that he had one episode of shaking chills about 1 week ago with nausea that has not recurred since then. He denies fevers, chest pain, vomiting, shortness of breath, dyspnea on exertion, diarrhea, or constipation. He states that he has chronic pain in both legs with numbness and tingling, "like they are always just a little asleep" for many years. The numbness and tingling goes up to his knees bilaterally. He also states that he has had discoloration on his shins for many years as well. He does not follow with a doctor regularly.   Physical Exam: Blood pressure 142/78, temperature 99.9 F (37.7 C), temperature source Oral, resp. rate 18, SpO2 95.00%.  Constitutional: Vital signs reviewed. Patient is a well-developed and well-nourished morbidly obese man in mild distress from pain. He is cooperative with exam. Alert and oriented x3.  Head: Normocephalic and atraumatic  Ear: TM normal bilaterally  Nose: No erythema or drainage noted. Turbinates normal  Mouth: no erythema or exudates, MMM  Eyes: PERRL, EOMI, conjunctivae normal, No scleral icterus.    Neck: Supple, Trachea midline normal ROM, No JVD, mass, thyromegaly, or carotid bruit present.  Cardiovascular: RRR, S1 normal, S2 normal, no MRG, pulses symmetric and intact bilaterally  Pulmonary/Chest: normal respiratory effort, mild expiratory wheezes in the posterior lung fields bilaterally. no rales, or rhonchi  Abdominal: obese, Soft. Non-tender, non-distended, bowel sounds are normal, no masses, organomegaly, or guarding present.  GU: no CVA tenderness Musculoskeletal: No joint deformities, erythema, or stiffness, ROM full and no nontender Hematology: no cervical, inginal, or axillary adenopathy.  Neurological: A&O x3, Strength is normal and symmetric bilaterally, cranial nerve II-XII are grossly intact, no focal motor deficit, sensory intact to light touch bilaterally.  Skin: There is bilateral evidence of venous stasis dermatitis. Left leg with 2+ pitting edema to the knee. Right leg is significantly more swollen then the left with 3+ pitting, tense edema to the mid thigh. There is erythema and warmth up the right leg to the mid thigh with the posterior thigh very tense and  painful to palpation. On the right mid lateral lower leg is a superficial wound that is open and draining serous fluid. On the anterior of the right lower tibia is a healing wound. On the right medial malleolus there is a superficial, malodorous skin ulcer that is 4 cm in diameter. Foot is warm with normal capillary refill.  Psychiatric: Normal mood and affect. speech and behavior is normal. Judgment and thought content normal. Cognition and memory are normal.  Labs: Basic Metabolic Panel:   Recent Labs   09/20/12 0610  09/20/12 0620   NA  133*  138   K  2.9*  2.9*   CL  98  97   CO2  25  --   GLUCOSE  121*  124*   BUN  6  4*   CREATININE  0.62  0.90   CALCIUM  9.1  --    Liver Function Tests:   Recent Labs   09/20/12 0610   AST  17   ALT  15   ALKPHOS  71   BILITOT  0.5   PROT  7.8   ALBUMIN  3.0*     CBC:   Recent Labs   09/20/12 0610  09/20/12 0620   WBC  10.8*  --   NEUTROABS  7.9*  --   HGB  12.2*  12.6*   HCT  35.8*  37.0*   MCV  82.5  --   PLT  234  --      HOSPITAL COURSE: RLE cellulitis - pt presented with complaints of RLE pain, redness and slightly increased swelling that had persisted for ~1 week. On exam the RLE was edematous, erythematous and TTP, most notably in the pretibial area, however the erythema ascended proximally to mid-thigh. Pt had no complaints of systemic symptoms, and no signs of systemic infection were noted at any time during his hospital course. He was started on vancomycin on admission which he received for 4 days before being transitioned to amoxicillin and bactrim ~24 hours prior to discharge. At time of discharge his pain was improved and he felt his RLE swelling was nearing his baseline. He was discharged with instructions to continue taking his amoxicillin and bactrim prescriptions until completed (total 14 day antibiotic course).   Chronic venous disease - pt has marked venous insufficiency in his bilateral lower extremities, with edema and skin changes consistent with chronic venous stasis apparent in RLE>LLE. He was instructed to keep his legs elevated while at rest, and that he would need to begin using compression stockings after his cellulitis was resolved.  Right medial malleolus venous stasis ulcer - wound care consulted during hospital stay for this issue, with no recommendations for debridement. Pt was referred to outpatient wound care at time of discharge.  AOCD - likely secondary to chronic venous disease. Ferritin = 362. Hb stable at ~11-12 throughout hospital course.  Chronic methadone treatment - methadone continued during hospital course. Pt given a 1-day prescription for methadone at discharge (as his methadone clinic was closed the following day).   DISCHARGE DATA: Vital Signs: BP 115/57  Pulse 71  Temp(Src) 98.2 F (36.8  C) (Oral)  Resp 18  Ht 6' 0.44" (1.84 m)  Wt 365 lb 8.4 oz (165.8 kg)  BMI 48.97 kg/m2  SpO2 96%   Time spent on discharge: 25 minutes  Services Ordered on Discharge: Y = Yes; Blank = No PT:   OT:   RN:   Equipment:   Other:  Signed: Elfredia Nevins, MD   PGY 1, Internal Medicine Resident 09/29/2012, 11:26 AM

## 2012-09-30 ENCOUNTER — Ambulatory Visit: Payer: Self-pay | Admitting: Internal Medicine

## 2012-10-11 ENCOUNTER — Encounter (HOSPITAL_BASED_OUTPATIENT_CLINIC_OR_DEPARTMENT_OTHER): Payer: MEDICAID

## 2013-07-17 ENCOUNTER — Inpatient Hospital Stay (HOSPITAL_COMMUNITY)
Admission: EM | Admit: 2013-07-17 | Discharge: 2013-07-20 | DRG: 603 | Disposition: A | Discharge status: Home / Self Care | Payer: Medicaid Other | Attending: Internal Medicine | Admitting: Internal Medicine

## 2013-07-17 ENCOUNTER — Inpatient Hospital Stay (HOSPITAL_COMMUNITY): Payer: Medicaid Other

## 2013-07-17 ENCOUNTER — Encounter (HOSPITAL_COMMUNITY): Payer: Self-pay | Admitting: Emergency Medicine

## 2013-07-17 DIAGNOSIS — L03116 Cellulitis of left lower limb: Secondary | ICD-10-CM

## 2013-07-17 DIAGNOSIS — I872 Venous insufficiency (chronic) (peripheral): Secondary | ICD-10-CM | POA: Diagnosis present

## 2013-07-17 DIAGNOSIS — L03119 Cellulitis of unspecified part of limb: Principal | ICD-10-CM

## 2013-07-17 DIAGNOSIS — E876 Hypokalemia: Secondary | ICD-10-CM

## 2013-07-17 DIAGNOSIS — F1190 Opioid use, unspecified, uncomplicated: Secondary | ICD-10-CM | POA: Diagnosis present

## 2013-07-17 DIAGNOSIS — Z8249 Family history of ischemic heart disease and other diseases of the circulatory system: Secondary | ICD-10-CM

## 2013-07-17 DIAGNOSIS — G8929 Other chronic pain: Secondary | ICD-10-CM | POA: Diagnosis present

## 2013-07-17 DIAGNOSIS — L02419 Cutaneous abscess of limb, unspecified: Principal | ICD-10-CM | POA: Diagnosis present

## 2013-07-17 DIAGNOSIS — L97909 Non-pressure chronic ulcer of unspecified part of unspecified lower leg with unspecified severity: Secondary | ICD-10-CM

## 2013-07-17 DIAGNOSIS — Z6841 Body Mass Index (BMI) 40.0 and over, adult: Secondary | ICD-10-CM

## 2013-07-17 DIAGNOSIS — L03115 Cellulitis of right lower limb: Secondary | ICD-10-CM

## 2013-07-17 DIAGNOSIS — Z833 Family history of diabetes mellitus: Secondary | ICD-10-CM

## 2013-07-17 DIAGNOSIS — R739 Hyperglycemia, unspecified: Secondary | ICD-10-CM

## 2013-07-17 DIAGNOSIS — D649 Anemia, unspecified: Secondary | ICD-10-CM

## 2013-07-17 DIAGNOSIS — L0390 Cellulitis, unspecified: Secondary | ICD-10-CM

## 2013-07-17 DIAGNOSIS — Z79899 Other long term (current) drug therapy: Secondary | ICD-10-CM

## 2013-07-17 DIAGNOSIS — I83009 Varicose veins of unspecified lower extremity with ulcer of unspecified site: Secondary | ICD-10-CM

## 2013-07-17 DIAGNOSIS — Z806 Family history of leukemia: Secondary | ICD-10-CM

## 2013-07-17 DIAGNOSIS — I8310 Varicose veins of unspecified lower extremity with inflammation: Secondary | ICD-10-CM

## 2013-07-17 DIAGNOSIS — M79609 Pain in unspecified limb: Secondary | ICD-10-CM

## 2013-07-17 DIAGNOSIS — L0291 Cutaneous abscess, unspecified: Secondary | ICD-10-CM

## 2013-07-17 DIAGNOSIS — F1120 Opioid dependence, uncomplicated: Secondary | ICD-10-CM | POA: Diagnosis present

## 2013-07-17 DIAGNOSIS — F17200 Nicotine dependence, unspecified, uncomplicated: Secondary | ICD-10-CM | POA: Diagnosis present

## 2013-07-17 LAB — CBC WITH DIFFERENTIAL/PLATELET
BASOS ABS: 0 10*3/uL (ref 0.0–0.1)
Basophils Relative: 0 % (ref 0–1)
EOS PCT: 1 % (ref 0–5)
Eosinophils Absolute: 0.1 10*3/uL (ref 0.0–0.7)
HCT: 33.2 % — ABNORMAL LOW (ref 39.0–52.0)
Hemoglobin: 11.2 g/dL — ABNORMAL LOW (ref 13.0–17.0)
LYMPHS ABS: 2.2 10*3/uL (ref 0.7–4.0)
LYMPHS PCT: 20 % (ref 12–46)
MCH: 29.1 pg (ref 26.0–34.0)
MCHC: 33.7 g/dL (ref 30.0–36.0)
MCV: 86.2 fL (ref 78.0–100.0)
Monocytes Absolute: 0.8 10*3/uL (ref 0.1–1.0)
Monocytes Relative: 8 % (ref 3–12)
NEUTROS PCT: 71 % (ref 43–77)
Neutro Abs: 7.8 10*3/uL — ABNORMAL HIGH (ref 1.7–7.7)
PLATELETS: 427 10*3/uL — AB (ref 150–400)
RBC: 3.85 MIL/uL — AB (ref 4.22–5.81)
RDW: 14.3 % (ref 11.5–15.5)
WBC: 11 10*3/uL — AB (ref 4.0–10.5)

## 2013-07-17 LAB — BASIC METABOLIC PANEL
BUN: 5 mg/dL — ABNORMAL LOW (ref 6–23)
CALCIUM: 9.1 mg/dL (ref 8.4–10.5)
CO2: 26 meq/L (ref 19–32)
Chloride: 97 mEq/L (ref 96–112)
Creatinine, Ser: 0.7 mg/dL (ref 0.50–1.35)
GFR calc Af Amer: >90 mL/min (ref >90)
GLUCOSE: 100 mg/dL — AB (ref 70–99)
POTASSIUM: 4.1 meq/L (ref 3.7–5.3)
SODIUM: 137 meq/L (ref 137–147)

## 2013-07-17 MED ORDER — ENOXAPARIN SODIUM 100 MG/ML ~~LOC~~ SOLN
100.0000 mg | SUBCUTANEOUS | Status: DC
Start: 2013-07-17 — End: 2013-07-20
  Administered 2013-07-17 – 2013-07-18 (×2): 100 mg via SUBCUTANEOUS
  Filled 2013-07-17 (×4): qty 1

## 2013-07-17 MED ORDER — ONDANSETRON HCL 4 MG/2ML IJ SOLN: 4.0000 mg | Freq: Four times a day (QID) | INTRAMUSCULAR | Status: DC | PRN

## 2013-07-17 MED ORDER — VANCOMYCIN HCL 10 G IV SOLR
2000.0000 mg | Freq: Two times a day (BID) | INTRAVENOUS | Status: DC
  Administered 2013-07-18 – 2013-07-20 (×5): 2000 mg via INTRAVENOUS
  Filled 2013-07-17 (×6): qty 2000

## 2013-07-17 MED ORDER — VANCOMYCIN HCL 10 G IV SOLR
2500.0000 mg | Freq: Once | INTRAVENOUS | Status: AC
  Administered 2013-07-17: 2500 mg via INTRAVENOUS
  Filled 2013-07-17: qty 2500

## 2013-07-17 MED ORDER — SENNA 8.6 MG PO TABS
1.0000 | ORAL_TABLET | Freq: Two times a day (BID) | ORAL | Status: DC
  Administered 2013-07-19 (×2): 8.6 mg via ORAL
  Filled 2013-07-17 (×7): qty 1

## 2013-07-17 MED ORDER — ACETAMINOPHEN 650 MG RE SUPP: 650.0000 mg | Freq: Four times a day (QID) | RECTAL | Status: DC | PRN

## 2013-07-17 MED ORDER — ACETAMINOPHEN 325 MG PO TABS: 650.0000 mg | ORAL_TABLET | Freq: Four times a day (QID) | ORAL | Status: DC | PRN

## 2013-07-17 MED ORDER — PIPERACILLIN-TAZOBACTAM 3.375 G IVPB
3.3750 g | Freq: Three times a day (TID) | INTRAVENOUS | Status: DC
Start: 2013-07-17 — End: 2013-07-20
  Administered 2013-07-17 – 2013-07-20 (×8): 3.375 g via INTRAVENOUS
  Filled 2013-07-17 (×10): qty 50

## 2013-07-17 MED ORDER — ONDANSETRON HCL 4 MG PO TABS: 4.0000 mg | ORAL_TABLET | Freq: Four times a day (QID) | ORAL | Status: DC | PRN

## 2013-07-17 MED ORDER — METHADONE HCL 10 MG PO TABS
170.0000 mg | ORAL_TABLET | Freq: Every day | ORAL | Status: DC
  Administered 2013-07-18 – 2013-07-20 (×3): 170 mg via ORAL
  Filled 2013-07-17 (×3): qty 17

## 2013-07-17 MED ORDER — BISACODYL 5 MG PO TBEC: 10.0000 mg | DELAYED_RELEASE_TABLET | Freq: Every day | ORAL | Status: DC | PRN

## 2013-07-17 MED ORDER — CLINDAMYCIN PHOSPHATE 600 MG/50ML IV SOLN
600.0000 mg | Freq: Once | INTRAVENOUS | Status: AC
  Administered 2013-07-17: 600 mg via INTRAVENOUS
  Filled 2013-07-17: qty 50

## 2013-07-17 MED ORDER — ENOXAPARIN SODIUM 40 MG/0.4ML ~~LOC~~ SOLN
40.0000 mg | SUBCUTANEOUS | Status: DC
  Filled 2013-07-17: qty 0.4

## 2013-07-17 NOTE — H&P (Addendum)
[facility] History and Physical  [name] VQQ:[id number] DOB: Jun 04, 1960 DOA: 07/17/2013  Referring physician: EDP PCP: No primary provider on file.   Chief Complaint: Persistent leg pain with drainage and swelling  HPI: [name] is a morbidly obese 53 y.o. male with past medical history is significant for venous deficiency/venous stasis dermatitis and prior history of left lower extremity venostasis ulcer, chronic pain, recently diagnosed with left lower extremity cellulitis and treated with a course of vancomycin by PICC line who presents oh with above complaints. He states that he has had an ulcer in that left lower extremity on and off for the past 6 months, but a couple of weeks ago he developed worsening swelling, redness, and pain in that left leg over a three-day period and he was seen at the [facility] ER>> a PICC line was placed and he was treated with outpatient vancomycin through yesterday 4/5. The PICC line was removed after completion of the antibiotics, but patient states that leg did not look any better as he continued to have increased pain swelling and redness so he came to the [facility] ED. he states that initially he had subjective fevers but since being started on the vancomycin he had not had any further fevers. He denies any recent trauma. He was seen in the ED, and labs revealed a WC count of 11. He was started on her antibiotics and is admitted for further evaluation and management.    Review of Systems The patient denies anorexia, weight loss,, vision loss, decreased hearing, hoarseness, chest pain, syncope, dyspnea on exertion, peripheral edema, balance deficits, hemoptysis, abdominal pain, melena, hematochezia, severe indigestion/heartburn, hematuria, incontinence, muscle weakness, suspicious skin lesions, transient blindness, depression, unusual weight change, abnormal bleeding   Past Medical History  Diagnosis Date  . Venous insufficiency of leg   . Morbid  obesity   . Venous stasis dermatitis    Past Surgical History  Procedure Laterality Date  . Carpal tunnel release      bilateral   . Eye surgery     Social History:  reports that he has been smoking Cigarettes.  He has a 105 pack-year smoking history. He has never used smokeless tobacco. He reports that he does not drink alcohol or use illicit drugs.  No Known Allergies  Family History  Problem Relation Age of Onset  . Diabetes Father   . Heart disease Father   . Leukemia Mother     passed away 08/11/2008  . Hypertension Father      Prior to Admission medications   Medication Sig Start Date End Date Taking? Authorizing Provider  methadone (DOLOPHINE) 10 MG tablet Take 17 tablets (170 mg total) by mouth daily. 09/24/12  Yes Emory Carollee Leitz, MD   Physical Exam: Filed Vitals:   07/17/13 1615  BP: 142/73  Pulse: 78  Temp: 99.1 F (37.3 C)  Resp: 24    BP 142/73  Pulse 78  Temp(Src) 99.1 F (37.3 C) (Oral)  Resp 24  Ht 6\' 1"  (1.854 m)  Wt 192.2 kg (423 lb 11.6 oz)  BMI 55.92 kg/m2  SpO2 96% Constitutional: Vital signs reviewed.  Patient is a well-developed and morbidly obese in no acute distress and cooperative with exam. Alert and oriented x3.  Head: Normocephalic and atraumatic Mouth: no erythema or exudates, MMM Eyes: PERRL, EOMI, conjunctivae normal, No scleral icterus.  Neck: Supple, Trachea midline normal ROM, No JVD, mass, thyromegaly, or carotid bruit present.  Cardiovascular: RRR, S1 normal, S2 normal, no  MRG, pulses symmetric and intact bilaterally Pulmonary/Chest: normal respiratory effort, CTAB, no wheezes, rales, or rhonchi Abdominal: Soft. Non-tender, non-distended, bowel sounds are normal, no masses, organomegaly, or guarding present.  GU: no CVA tenderness  extremities: LLE edematous and markedly indurated especially on the lateral aspect. Also laterally in the mid lower leg area there is a serous drainage/weeping.+ Tenderness, and erythema present. Right  lower extremity with chronic stasis changes.  Neurological: A&O x3, Strength is normal and symmetric bilaterally, cranial nerve II-XII are grossly intact, no focal motor deficit, sensory intact to light touch bilaterally.  Skin: Warm, dry and intact. No rash Psychiatric: Normal mood and affect. speech and behavior is normal. Judgment and thought content normal. Cognition and memory are normal.                Labs on Admission:  Basic Metabolic Panel:  Recent Labs Lab 07/17/13 1300  NA 137  K 4.1  CL 97  CO2 26  GLUCOSE 100*  BUN 5*  CREATININE 0.70  CALCIUM 9.1   Liver Function Tests: No results found for this basename: AST, ALT, ALKPHOS, BILITOT, PROT, ALBUMIN,  in the last 168 hours No results found for this basename: LIPASE, AMYLASE,  in the last 168 hours No results found for this basename: AMMONIA,  in the last 168 hours CBC:  Recent Labs Lab 07/17/13 1300  WBC 11.0*  NEUTROABS 7.8*  HGB 11.2*  HCT 33.2*  MCV 86.2  PLT 427*   Cardiac Enzymes: No results found for this basename: CKTOTAL, CKMB, CKMBINDEX, TROPONINI,  in the last 168 hours  BNP (last 3 results) No results found for this basename: PROBNP,  in the last 8760 hours CBG: No results found for this basename: GLUCAP,  in the last 168 hours  Radiological Exams on Admission: No results found.    Assessment/Plan Active Problems: Left lower extremity Cellulitis -As discussed above, Will place on Vanc& Zosyn -Follow and obtained cultures if febrile, he has been on [facility] for a week already as discussed above so would be unlikely to show any growth -Obtain x-ray of left lower extremity, follow -wound care consult -Continue his outpatient methadone for pain management Chronic pain  -Continue methadone   Venous stasis dermatitis   Morbid obesity        Code Status: Full Family Communication: None at bedside Disposition Plan: Admit to med surge bed  Time spent: Greater than 30  minutes  [facility] Pager [phone]

## 2013-07-17 NOTE — ED Notes (Signed)
Pt requesting something to eat. Heart Healthy meal tray ordered for patient.

## 2013-07-17 NOTE — ED Notes (Signed)
Pt reports to the ED for eval of swelling, erythema, serous drainage, and increased pain to the left leg. He reports he has hx of venous insufficiency and he developed a sore to the left medial ankle area 6 months ago and then 2 weeks ago he developed swelling, erythema to the upper thigh, increased pain, serous drainage, and intermittent fevers. He was seen at [facility] and given Vancomycin bid via PICC for 8 day and he reports the symptoms decreased but did not resolve. Erythema noted up to the knee. He also reports intermittent fevers and chills. Pt A&Ox4, resp e/u, and and skin warm and dry.

## 2013-07-17 NOTE — Progress Notes (Addendum)
ANTIBIOTIC CONSULT NOTE - INITIAL  Pharmacy Consult for Vanco/Zosyn Indication: Cellulitis  No Known Allergies  Patient Measurements: Height: 6\' 1"  (185.4 cm) Weight: 423 lb 11.6 oz (192.2 kg) IBW/kg (Calculated) : 79.9 Adjusted Body Weight:  113.7kg  Vital Signs: Temp: 99.1 F (37.3 C) (04/06 1615) Temp src: Oral (04/06 1615) BP: 142/73 mmHg (04/06 1615) Pulse Rate: 78 (04/06 1615) Intake/Output from previous day:   Intake/Output from this shift:    Labs:  Recent Labs  07/17/13 1300  WBC 11.0*  HGB 11.2*  PLT 427*  CREATININE 0.70   Estimated Creatinine Clearance: 192.8 ml/min (by C-G formula based on Cr of 0.7). No results found for this basename: VANCOTROUGH, VANCOPEAK, VANCORANDOM, GENTTROUGH, GENTPEAK, GENTRANDOM, TOBRATROUGH, TOBRAPEAK, TOBRARND, AMIKACINPEAK, AMIKACINTROU, AMIKACIN,  in the last 72 hours   Microbiology: No results found for this or any previous visit (from the past 720 hour(s)).  Medical History: Past Medical History  Diagnosis Date  . Venous insufficiency of leg   . Morbid obesity   . Venous stasis dermatitis     Medications:  Prescriptions prior to admission  Medication Sig Dispense Refill  . methadone (DOLOPHINE) 10 MG tablet Take 17 tablets (170 mg total) by mouth daily.  17 tablet  0   Assessment: Leg pain with drainage and swelling 53 y/o morbidly obese male presents with persistent leg pain with drainage and swelling. Pt has a h/o venous insufficiency and venous stasis dermatitis. Also has h/o ulcer, chronic pain and was recently dx with LLE cellulitis and treated with Vanco. In conversation with pt her was treated 3/29- 4/5 (8days) with Vanco 2g BID via PICC (went to [facility] ED twice daily for infusions).  Labs: WBC 11. Scr 0.7, Glucose 100  Goal of Therapy:  Vancomycin trough level 15-20 mcg/ml for recurrent and hard to treat cellulitis  Plan:  Increase Lovenox to 100mg  SQ BID (O.5mg /kg/day for DVT  prophylaxis.)--ok'd by Dr. Inis Sizer  Vancomycin 2500mg  IV x 1 now then 2000mg  IV BID.  Vanco trough after 3-4 doses at steady state.  Doninique Lwin S. Alford Highland, PharmD, BCPS Clinical Staff Pharmacist Pager [phone]  Eilene Ghazi Stillinger 07/17/2013,5:40 PM

## 2013-07-17 NOTE — ED Notes (Signed)
Pt has just ended treatment via PICC of vancomycin for "infection" in leg as diagnosed by [facility]. Pt complains of continued left leg edema, redness from foot up beyond knee, and serous drainage from posterior lower left leg. Complains of 10/10 pain upon standing but reports relief from pain when leg is supported and flat. Patient denies any other pain or symptoms. Pt A&O and in NAD.

## 2013-07-17 NOTE — ED Provider Notes (Signed)
CSN: [id number]     Arrival date & time 07/17/13  1057 History   First MD Initiated Contact with Patient 07/17/13 1227     Chief Complaint  Patient presents with  . Leg Pain     (Consider location/radiation/quality/duration/timing/severity/associated sxs/prior Treatment) HPI Comments: Pt with history of cellulitis in past, comes in with cellulitis of LLE. Pt states 7-8 days ago, it started, and states he was in [place] ED, had a PICC placed and was getting q 12 h Vanc, and states has improved, but still having pain and still is extensive. Denies f/c, abd pain, n/v/d  Patient is a 53 y.o. male presenting with leg pain. The history is provided by the patient.  Leg Pain Location:  Leg Time since incident:  1 week Injury: no   Leg location:  L leg Pain details:    Quality:  Throbbing   Radiates to:  Does not radiate   Severity:  Moderate   Onset quality:  Gradual   Duration:  1 week   Timing:  Constant   Progression:  Improving Chronicity:  Recurrent Foreign body present:  No foreign bodies Prior injury to area:  No Relieved by:  Nothing Worsened by:  Bearing weight Ineffective treatments: vancomycin. Associated symptoms: swelling   Associated symptoms: no decreased ROM, no fever and no numbness   Risk factors: obesity   Risk factors: no frequent fractures   Risk factors comment:   hx of cellulitis   Past Medical History  Diagnosis Date  . Venous insufficiency of leg   . Morbid obesity   . Venous stasis dermatitis    Past Surgical History  Procedure Laterality Date  . Carpal tunnel release      bilateral   . Eye surgery     Family History  Problem Relation Age of Onset  . Diabetes Father   . Heart disease Father   . Leukemia Mother     passed away Jul 31, 2008  . Hypertension Father    History  Substance Use Topics  . Smoking status: Current Every Day Smoker -- 3.00 packs/day for 35 years    Types: Cigarettes  . Smokeless tobacco: Never Used     Comment: currently  only smoking 0.5 ppd  . Alcohol Use: No    Review of Systems  Constitutional: Negative for fever, activity change and appetite change.  HENT: Negative for congestion and rhinorrhea.   Eyes: Negative for discharge and itching.  Respiratory: Negative for cough, shortness of breath and wheezing.   Cardiovascular: Positive for leg swelling. Negative for chest pain.  Gastrointestinal: Negative for nausea, vomiting, abdominal pain, diarrhea and constipation.  Genitourinary: Negative for hematuria, decreased urine volume and difficulty urinating.  Musculoskeletal: Positive for gait problem.  Skin: Negative for rash and wound.  Neurological: Negative for syncope, weakness and numbness.  All other systems reviewed and are negative.    Allergies  Review of patient's allergies indicates no known allergies.  Home Medications   Current Outpatient Rx  Name  Route  Sig  Dispense  Refill  . methadone (DOLOPHINE) 10 MG tablet   Oral   Take 17 tablets (170 mg total) by mouth daily.   17 tablet   0    BP 117/69  Pulse 76  Temp(Src) 98.1 F (36.7 C) (Oral)  Resp 14  SpO2 94% Physical Exam  Vitals reviewed. Constitutional: He is oriented to person, place, and time. He appears well-developed and well-nourished. No distress.  Obese male, pleasant NAD  HENT:  Head: Normocephalic and atraumatic.  Mouth/Throat: Oropharynx is clear and moist. No oropharyngeal exudate.  Eyes: Conjunctivae and EOM are normal. Pupils are equal, round, and reactive to light. Right eye exhibits no discharge. Left eye exhibits no discharge. No scleral icterus.  Neck: Normal range of motion. Neck supple.  Cardiovascular: Normal rate, regular rhythm, normal heart sounds and intact distal pulses.  Exam reveals no gallop and no friction rub.   No murmur heard. Pulmonary/Chest: Effort normal and breath sounds normal. No respiratory distress. He has no wheezes. He has no rales.  Abdominal: Soft. He exhibits no distension  and no mass. There is no tenderness.  Musculoskeletal: He exhibits edema and tenderness.  Chronic venous insuff to b/l LEs  LLE: 2+ edema, erythema from proximal foot extending to  Mid thigh, circumferential. Has 4x3 cm mild superifical skin ulcer on proximal posterior calf. Mildly tender throughout.  Neurological: He is alert and oriented to person, place, and time. No cranial nerve deficit. He exhibits normal muscle tone. Coordination normal.  Skin: Skin is warm. No rash noted. He is not diaphoretic.    ED Course  Procedures (including critical care time) Labs Review Labs Reviewed  CBC WITH DIFFERENTIAL - Abnormal; Notable for the following:    WBC 11.0 (*)    RBC 3.85 (*)    Hemoglobin 11.2 (*)    HCT 33.2 (*)    Platelets 427 (*)    Neutro Abs 7.8 (*)    All other components within normal limits  BASIC METABOLIC PANEL - Abnormal; Notable for the following:    Glucose, Bld 100 (*)    BUN 5 (*)    All other components within normal limits   Imaging Review No results found.   EKG Interpretation None      MDM   MDM: 53 y.o. WM w/ PMhx of cellulitis in past, obesity w/ cc: of cellulits to LLE. 8 days ago, rendess to leg. Was being treated outpatient with a PICC and IV Vanc for a week and just finished. States still looks bad, and feels bad, but has improved slightly. No fever or systemic signs. AFVSS here. Large cellulitis from proximal foot to distal thigh. 4x3 cm superifical ulcer on posterior calf. 1+ Edema, tender, warm. 1+ DP pulses, good sensation. Likely cellulitis. Will check labs, will give Clinda. Based on failing Vanc and extent, will need admission. Unlikely DVT as patient with no hx of clot, no recent travel/surgery/trauma but if failure to improve, can be re-assessed as inpatient. Admit. Pt remaiend HDS and pain controlled while in ED. Care of case d/w my attending.   Final diagnoses:  None    Admit   Sol Passer, MD 07/17/13 1555

## 2013-07-17 NOTE — Progress Notes (Signed)
Patient admitted to unit 1610. Oriented to room, call bell, and staff. Bed in lowest position. Fall safety plan reviewed. Full assessment to Epic. Will continue to monitor. Toniann Ket, RN

## 2013-07-17 NOTE — ED Provider Notes (Signed)
Medical screening examination/treatment/procedure(s) were conducted as a shared visit with non-physician practitioner(s) or resident and myself. I personally evaluated the patient during the encounter and agree with the findings and plan unless otherwise indicated.  I have personally reviewed any xrays and/ or EKG's with the provider and I agree with interpretation.  Patient with obesity and venous insufficiency history presents with persistent left leg erythema, warmth, drainage and swelling. Patient has finished 7 days of IV vancomycin via PICC line however only mild improvement. Patient does have abrasions and open wounds on his leg with mild yellow drainage. Intermittent fevers and chills. No blood clot history, smoker. On exam patient has tenderness, warmth, erythema, swelling from mid thigh down to his toes on the left. Sensation intact, no crepitus appreciated. Clinically cellulitis. I feel patient has failed outpatient therapy especially having IV vancomycin. Plan for blood work, IV and admission for further evaluation and possible ID consult per medicine.  Labs Reviewed  CBC WITH DIFFERENTIAL - Abnormal; Notable for the following:    WBC 11.0 (*)    RBC 3.85 (*)    Hemoglobin 11.2 (*)    HCT 33.2 (*)    Platelets 427 (*)    Neutro Abs 7.8 (*)    All other components within normal limits  BASIC METABOLIC PANEL - Abnormal; Notable for the following:    Glucose, Bld 100 (*)    BUN 5 (*)    All other components within normal limits    Cellulitis, left leg swelling   Mariea Clonts, MD 07/17/13 1630

## 2013-07-17 NOTE — ED Notes (Signed)
Pt given sprite per MD approval

## 2013-07-18 ENCOUNTER — Telehealth: Payer: Self-pay | Admitting: Emergency Medicine

## 2013-07-18 DIAGNOSIS — E876 Hypokalemia: Secondary | ICD-10-CM

## 2013-07-18 DIAGNOSIS — F1190 Opioid use, unspecified, uncomplicated: Secondary | ICD-10-CM | POA: Diagnosis present

## 2013-07-18 DIAGNOSIS — R7309 Other abnormal glucose: Secondary | ICD-10-CM

## 2013-07-18 DIAGNOSIS — M79609 Pain in unspecified limb: Secondary | ICD-10-CM

## 2013-07-18 LAB — CBC
HCT: 33.4 % — ABNORMAL LOW (ref 39.0–52.0)
HEMOGLOBIN: 11.1 g/dL — AB (ref 13.0–17.0)
MCH: 28.8 pg (ref 26.0–34.0)
MCHC: 33.2 g/dL (ref 30.0–36.0)
MCV: 86.5 fL (ref 78.0–100.0)
PLATELETS: 409 10*3/uL — AB (ref 150–400)
RBC: 3.86 MIL/uL — AB (ref 4.22–5.81)
RDW: 14.5 % (ref 11.5–15.5)
WBC: 9.2 10*3/uL (ref 4.0–10.5)

## 2013-07-18 MED ORDER — FUROSEMIDE 10 MG/ML IJ SOLN
40.0000 mg | Freq: Three times a day (TID) | INTRAMUSCULAR | Status: DC
  Administered 2013-07-18 – 2013-07-20 (×5): 40 mg via INTRAVENOUS
  Filled 2013-07-18 (×9): qty 4

## 2013-07-18 NOTE — Consult Note (Addendum)
WOC wound consult note Reason for Consult: Consult requested for left leg.  Pt states he developed cellulitis for several weeks which evolved into leaking area this week. Area very painful to touch. Wound type: Partial thickness patchy area of skin loss to left outer calf.   Left anterior calf has 2 previous full thickness wounds which have currently evolved into 100% dry scabs; .5X.5X.1cm and 1X1X.1cm.  Topical treatment is not indicated at this time; no open wound, drainage, or odor at these sites, open to air. Measurement:12X8X.1cm to left outer calf Wound bed: Grey and macerated loose skin. Drainage (amount, consistency, odor) Large amt yellow drainage, no odor. Periwound: Generalized edema and erythremia surrounding. Dressing procedure/placement/frequency: Foam dressing to absorb drainage and decrease adherence to wound bed.  Please re-consult if further assistance is needed.  Thank-you,  Julien Girt MSN, [place], [place], [place], [place]

## 2013-07-18 NOTE — Progress Notes (Signed)
[facility] PROGRESS NOTE   [name] JKD:[id number] DOB: Aug 26, 1960 DOA: 07/17/2013 PCP: No primary provider on file.  HPI/Subjective: Denies fever chills, overall feels better.  Assessment/Plan: Active Problems:   Venous stasis dermatitis   Morbid obesity   Cellulitis    Left lower extremity cellulitis -Patient was on vancomycin for the past week. -Added Zosyn and continue vancomycin for now. -Plain x-ray showed no evidence of osteomyelitis or gas under the skin. -If no improvement can obtain CT versus MRI to rule out abscess formation. -Keep leg elevated, continue current antibiotics.  Morbid obesity -Body mass index is 55.92 kg/(m^2). -Patient counseled extensively about losing weight.  Stasis dermatitis -Bilateral stasis dermatitis likely secondary to body habitus. -I will start IV Lasix, check weight and renal function in am.  Chronic methadone use -Methadone restarted at home dose.  Code Status: Full Code Family Communication: Plan discussed with the patient. Disposition Plan: Remains inpatient   Consultants:  None  Procedures:  None  Antibiotics:  Vancomycin and Zosyn   Objective: Filed Vitals:   07/18/13 0451  BP: 137/80  Pulse: 79  Temp: 98.5 F (36.9 C)  Resp: 20    Intake/Output Summary (Last 24 hours) at 07/18/13 1051 Last data filed at 07/18/13 0318  Gross per 24 hour  Intake    700 ml  Output      0 ml  Net    700 ml   Filed Weights   07/17/13 1615  Weight: 192.2 kg (423 lb 11.6 oz)    Exam: General: Alert and awake, oriented x3, not in any acute distress. HEENT: anicteric sclera, pupils reactive to light and accommodation, EOMI CVS: S1-S2 clear, no murmur rubs or gallops Chest: clear to auscultation bilaterally, no wheezing, rales or rhonchi Abdomen: soft nontender, nondistended, normal bowel sounds, no organomegaly Extremities: no cyanosis, clubbing or edema noted bilaterally Neuro: Cranial nerves II-XII intact,  no focal neurological deficits  Data Reviewed: Basic Metabolic Panel:  Recent Labs Lab 07/17/13 1300  NA 137  K 4.1  CL 97  CO2 26  GLUCOSE 100*  BUN 5*  CREATININE 0.70  CALCIUM 9.1   Liver Function Tests: No results found for this basename: AST, ALT, ALKPHOS, BILITOT, PROT, ALBUMIN,  in the last 168 hours No results found for this basename: LIPASE, AMYLASE,  in the last 168 hours No results found for this basename: AMMONIA,  in the last 168 hours CBC:  Recent Labs Lab 07/17/13 1300 07/18/13 0704  WBC 11.0* 9.2  NEUTROABS 7.8*  --   HGB 11.2* 11.1*  HCT 33.2* 33.4*  MCV 86.2 86.5  PLT 427* 409*   Cardiac Enzymes: No results found for this basename: CKTOTAL, CKMB, CKMBINDEX, TROPONINI,  in the last 168 hours BNP (last 3 results) No results found for this basename: PROBNP,  in the last 8760 hours CBG: No results found for this basename: GLUCAP,  in the last 168 hours  Micro No results found for this or any previous visit (from the past 240 hour(s)).   Studies: Dg Tibia/fibula Left  07/17/2013   CLINICAL DATA:  Left tibia and fibula region pain and drainage.  EXAM: LEFT TIBIA AND FIBULA - 2 VIEW  COMPARISON:  None.  FINDINGS: No fracture or bone lesion. No area of bone resorption is seen to suggest osteomyelitis. Knee and ankle joints are normally aligned There is diffuse soft tissue edema.  IMPRESSION: No fracture. No evidence of osteomyelitis. Diffuse soft tissue edema.   Electronically Signed   By:  Lajean Manes M.D.   On: 07/17/2013 21:33    Scheduled Meds: . enoxaparin (LOVENOX) injection  100 mg Subcutaneous Q24H  . methadone  170 mg Oral Q0600  . piperacillin-tazobactam (ZOSYN)  IV  3.375 g Intravenous Q8H  . senna  1 tablet Oral BID  . vancomycin  2,000 mg Intravenous Q12H   Continuous Infusions:      Time spent: 35 minutes    [facility]  [facility] Pager [phone] If 7PM-7AM, please contact night-coverage at [email or web address], password  [facility] 07/18/2013, 10:51 AM  LOS: 1 day

## 2013-07-18 NOTE — Progress Notes (Signed)
Utilization review completed.  

## 2013-07-18 NOTE — Progress Notes (Signed)
Nutrition Brief Note  Patient identified on the Malnutrition Screening Tool (MST) Report. Pt reports that his appetite is adequate, denies any issues regarding nutrition at this time.  Wt Readings from Last 15 Encounters:  07/17/13 423 lb 11.6 oz (192.2 kg)  09/23/12 365 lb 8.4 oz (165.8 kg)  10/04/07 360 lb (163.295 kg)    Body mass index is 55.92 kg/(m^2). Patient meets criteria for Obese Class III based on current BMI.   Current diet order is [place], patient is consuming approximately >75% of meals at this time. Labs and medications reviewed.   No nutrition interventions warranted at this time. If nutrition issues arise, please consult RD.   Inda Coke MS, RD, LDN Inpatient Registered Dietitian Pager: [phone] After-hours pager: [phone]

## 2013-07-18 NOTE — Telephone Encounter (Signed)
Case manager from [place] called to schedule pt HFU  I attempted to schedule pt but pt is on IV atbs and the nurse unsure of how long. I can schedule pt with Dr. Allyson Sabal if nurse calls back.

## 2013-07-19 DIAGNOSIS — M79609 Pain in unspecified limb: Secondary | ICD-10-CM

## 2013-07-19 DIAGNOSIS — L02419 Cutaneous abscess of limb, unspecified: Principal | ICD-10-CM

## 2013-07-19 DIAGNOSIS — M7989 Other specified soft tissue disorders: Secondary | ICD-10-CM

## 2013-07-19 DIAGNOSIS — L03119 Cellulitis of unspecified part of limb: Principal | ICD-10-CM

## 2013-07-19 DIAGNOSIS — F1110 Opioid abuse, uncomplicated: Secondary | ICD-10-CM

## 2013-07-19 LAB — CBC
HEMATOCRIT: 33.7 % — AB (ref 39.0–52.0)
HEMOGLOBIN: 11.2 g/dL — AB (ref 13.0–17.0)
MCH: 28.9 pg (ref 26.0–34.0)
MCHC: 33.2 g/dL (ref 30.0–36.0)
MCV: 86.9 fL (ref 78.0–100.0)
Platelets: 442 10*3/uL — ABNORMAL HIGH (ref 150–400)
RBC: 3.88 MIL/uL — AB (ref 4.22–5.81)
RDW: 14.5 % (ref 11.5–15.5)
WBC: 9.3 10*3/uL (ref 4.0–10.5)

## 2013-07-19 LAB — COMPREHENSIVE METABOLIC PANEL
ALT: 24 U/L (ref 0–53)
AST: 18 U/L (ref 0–37)
Albumin: 2.8 g/dL — ABNORMAL LOW (ref 3.5–5.2)
Alkaline Phosphatase: 55 U/L (ref 39–117)
BUN: 9 mg/dL (ref 6–23)
CALCIUM: 8.9 mg/dL (ref 8.4–10.5)
CO2: 27 meq/L (ref 19–32)
CREATININE: 0.82 mg/dL (ref 0.50–1.35)
Chloride: 95 mEq/L — ABNORMAL LOW (ref 96–112)
GLUCOSE: 109 mg/dL — AB (ref 70–99)
Potassium: 4 mEq/L (ref 3.7–5.3)
Sodium: 135 mEq/L — ABNORMAL LOW (ref 137–147)
TOTAL PROTEIN: 8.4 g/dL — AB (ref 6.0–8.3)
Total Bilirubin: 0.3 mg/dL (ref 0.3–1.2)

## 2013-07-19 LAB — VANCOMYCIN, TROUGH: VANCOMYCIN TR: 12.3 ug/mL (ref 10.0–20.0)

## 2013-07-19 NOTE — Progress Notes (Signed)
[facility] PROGRESS NOTE   [name] BOF:[id number] DOB: 1960-11-16 DOA: 07/17/2013 PCP: No primary provider on file.  HPI/Subjective: Feels better, has a lot of urine output, still has lower extremity edema/redness.  Assessment/Plan: Principal Problem:   Cellulitis of left lower extremity Active Problems:   Venous stasis dermatitis   Morbid obesity   Chronic narcotic use    Left lower extremity cellulitis -Patient was on vancomycin for the past week. -Added Zosyn and continue vancomycin for now. -Plain x-ray showed no evidence of osteomyelitis or gas under the skin. -If no improvement can obtain CT versus MRI to rule out abscess formation. -Keep leg elevated, continue current antibiotics.  Morbid obesity -Body mass index is 55.58 kg/(m^2). -Patient counseled extensively about losing weight.  Stasis dermatitis -Bilateral stasis dermatitis likely secondary to body habitus. -I will start IV Lasix, check weight and renal function in am.  Chronic methadone use -Methadone restarted at home dose.  Code Status: Full Code Family Communication: Plan discussed with the patient. Disposition Plan: Remains inpatient   Consultants:  None  Procedures:  None  Antibiotics:  Vancomycin and Zosyn   Objective: Filed Vitals:   07/19/13 0507  BP: 130/70  Pulse: 71  Temp: 98.5 F (36.9 C)  Resp: 20    Intake/Output Summary (Last 24 hours) at 07/19/13 1404 Last data filed at 07/19/13 0955  Gross per 24 hour  Intake    530 ml  Output   1600 ml  Net  -1070 ml   Filed Weights   07/17/13 1615 07/19/13 0507  Weight: 192.2 kg (423 lb 11.6 oz) 191.055 kg (421 lb 3.2 oz)    Exam: General: Alert and awake, oriented x3, not in any acute distress. HEENT: anicteric sclera, pupils reactive to light and accommodation, EOMI CVS: S1-S2 clear, no murmur rubs or gallops Chest: clear to auscultation bilaterally, no wheezing, rales or rhonchi Abdomen: soft nontender,  nondistended, normal bowel sounds, no organomegaly Extremities: no cyanosis, clubbing or edema noted bilaterally Neuro: Cranial nerves II-XII intact, no focal neurological deficits  Data Reviewed: Basic Metabolic Panel:  Recent Labs Lab 07/17/13 1300 07/19/13 0644  NA 137 135*  K 4.1 4.0  CL 97 95*  CO2 26 27  GLUCOSE 100* 109*  BUN 5* 9  CREATININE 0.70 0.82  CALCIUM 9.1 8.9   Liver Function Tests:  Recent Labs Lab 07/19/13 0644  AST 18  ALT 24  ALKPHOS 55  BILITOT 0.3  PROT 8.4*  ALBUMIN 2.8*   No results found for this basename: LIPASE, AMYLASE,  in the last 168 hours No results found for this basename: AMMONIA,  in the last 168 hours CBC:  Recent Labs Lab 07/17/13 1300 07/18/13 0704 07/19/13 0644  WBC 11.0* 9.2 9.3  NEUTROABS 7.8*  --   --   HGB 11.2* 11.1* 11.2*  HCT 33.2* 33.4* 33.7*  MCV 86.2 86.5 86.9  PLT 427* 409* 442*   Cardiac Enzymes: No results found for this basename: CKTOTAL, CKMB, CKMBINDEX, TROPONINI,  in the last 168 hours BNP (last 3 results) No results found for this basename: PROBNP,  in the last 8760 hours CBG: No results found for this basename: GLUCAP,  in the last 168 hours  Micro No results found for this or any previous visit (from the past 240 hour(s)).   Studies: Dg Tibia/fibula Left  07/17/2013   CLINICAL DATA:  Left tibia and fibula region pain and drainage.  EXAM: LEFT TIBIA AND FIBULA - 2 VIEW  COMPARISON:  None.  FINDINGS: No fracture or bone lesion. No area of bone resorption is seen to suggest osteomyelitis. Knee and ankle joints are normally aligned There is diffuse soft tissue edema.  IMPRESSION: No fracture. No evidence of osteomyelitis. Diffuse soft tissue edema.   Electronically Signed   By: Lajean Manes M.D.   On: 07/17/2013 21:33    Scheduled Meds: . enoxaparin (LOVENOX) injection  100 mg Subcutaneous Q24H  . furosemide  40 mg Intravenous 3 times per day  . methadone  170 mg Oral Q0600  .  piperacillin-tazobactam (ZOSYN)  IV  3.375 g Intravenous Q8H  . senna  1 tablet Oral BID  . vancomycin  2,000 mg Intravenous Q12H   Continuous Infusions:      Time spent: 35 minutes    Fran Mcree [facility]  [facility] Pager [phone] If 7PM-7AM, please contact night-coverage at [email or web address], password [facility] 07/19/2013, 2:04 PM  LOS: 2 days

## 2013-07-19 NOTE — Progress Notes (Signed)
VASCULAR LAB PRELIMINARY  PRELIMINARY  PRELIMINARY  PRELIMINARY  Left lower extremity venous duplex completed.    Preliminary report:  Left:  No evidence of DVT, superficial thrombosis, or Baker's cyst.  Vilma Prader [place], RVS 07/19/2013, 11:29 AM

## 2013-07-19 NOTE — Progress Notes (Signed)
07/19/13 Spoke with patent about f/u with PCP. He stated that he has not been able to get an appt with his Medicaid PCP. Willing to go to [employer]. Made appt for 07/24/13 at 10am. appt info given to patient. List of Medicaid physicians in [facility] given to patient. Fuller Plan RN, BSN, CCM

## 2013-07-19 NOTE — Progress Notes (Signed)
ANTIBIOTIC CONSULT NOTE - INITIAL  Pharmacy Consult for Vanco/Zosyn Indication: Cellulitis  No Known Allergies  Patient Measurements: Height: 6\' 1"  (185.4 cm) Weight: 421 lb 3.2 oz (191.055 kg) IBW/kg (Calculated) : 79.9 Adjusted Body Weight:  113.7kg  Vital Signs: Temp: 98.9 F (37.2 C) (04/08 1502) Temp src: Oral (04/08 1502) BP: 123/96 mmHg (04/08 1502) Pulse Rate: 81 (04/08 1502) Intake/Output from previous day: 04/07 0701 - 04/08 0700 In: 888 [P.O.:838; IV Piggyback:50] Out: 1600 [Urine:1600] Intake/Output from this shift:    Labs:  Recent Labs  07/17/13 1300 07/18/13 0704 07/19/13 0644  WBC 11.0* 9.2 9.3  HGB 11.2* 11.1* 11.2*  PLT 427* 409* 442*  CREATININE 0.70  --  0.82   Estimated Creatinine Clearance: 187.5 ml/min (by C-G formula based on Cr of 0.82).  Recent Labs  07/19/13 [place] 12.3     Microbiology: No results found for this or any previous visit (from the past 720 hour(s)).  Medical History: Past Medical History  Diagnosis Date  . Venous insufficiency of leg   . Morbid obesity   . Venous stasis dermatitis     Medications:  Prescriptions prior to admission  Medication Sig Dispense Refill  . methadone (DOLOPHINE) 10 MG tablet Take 17 tablets (170 mg total) by mouth daily.  17 tablet  0   Assessment: Leg pain with drainage and swelling 53 y/o morbidly obese male on vancomycin for LLE cellulitis, pt. Was on vancomycin 3/29- 4/5 (8days) prior to admission, now also added zosyn. x-ray showed no evidence of osteomyelitis or gas under the skin. He is afebrile, wbc wnl. Renal function stable, est. crcl > 100 ml/min, vancomycin trough therapeutic = 12.3, drawn 1 hr late.  Goal of Therapy:  Vancomycin trough level 10-15 mcg/ml   Plan:   Continue vancomycin 2000mg  IV Q 12 hrs F/u renal function and clinical improvement.    Maryanna Shape, PharmD, BCPS  Clinical Pharmacist  Pager: [phone]   07/19/2013,8:42 PM

## 2013-07-20 LAB — BASIC METABOLIC PANEL
BUN: 11 mg/dL (ref 6–23)
CALCIUM: 9.3 mg/dL (ref 8.4–10.5)
CO2: 26 meq/L (ref 19–32)
Chloride: 96 mEq/L (ref 96–112)
Creatinine, Ser: 0.79 mg/dL (ref 0.50–1.35)
GFR calc Af Amer: >90 mL/min (ref >90)
GFR calc non Af Amer: >90 mL/min (ref >90)
GLUCOSE: 102 mg/dL — AB (ref 70–99)
POTASSIUM: 4 meq/L (ref 3.7–5.3)
SODIUM: 138 meq/L (ref 137–147)

## 2013-07-20 MED ORDER — SULFAMETHOXAZOLE-TRIMETHOPRIM 800-160 MG PO TABS: 1.0000 | ORAL_TABLET | Freq: Two times a day (BID) | ORAL | Status: DC

## 2013-07-20 MED ORDER — DOXYCYCLINE HYCLATE 100 MG PO TABS: 100.0000 mg | ORAL_TABLET | Freq: Two times a day (BID) | ORAL | Status: DC

## 2013-07-20 MED ORDER — POTASSIUM CHLORIDE ER 20 MEQ PO TBCR: 20.0000 meq | EXTENDED_RELEASE_TABLET | Freq: Every day | ORAL | Status: DC

## 2013-07-20 MED ORDER — FUROSEMIDE 40 MG PO TABS: 40.0000 mg | ORAL_TABLET | Freq: Every day | ORAL | Status: DC

## 2013-07-20 NOTE — Progress Notes (Signed)
Dc home, verbally understood DC instructions, no questions asked

## 2013-07-20 NOTE — Discharge Summary (Signed)
PATIENT DETAILS Name: [name] Age: 53 y.o. Sex: male Date of Birth: 11-Sep-1960 MRN: [id number]. Admit Date: 07/17/2013 Admitting Physician: Sheila Oats, MD PCP:No primary provider on file.  Recommendations for Outpatient Follow-up:  1. Please reassess left lower extremity cellulitis on followup. 2. Gen. health maintenance 3. Please monitor electrolytes while on Lasix. 4. May need referral to a wound care clinic  PRIMARY DISCHARGE DIAGNOSIS:  Principal Problem:   Cellulitis of left lower extremity Active Problems:   Venous stasis dermatitis   Morbid obesity   Chronic narcotic use      PAST MEDICAL HISTORY: Past Medical History  Diagnosis Date  . Venous insufficiency of leg   . Morbid obesity   . Venous stasis dermatitis     DISCHARGE MEDICATIONS:   Medication List         doxycycline 100 MG tablet  Commonly known as:  VIBRA-TABS  Take 1 tablet (100 mg total) by mouth 2 (two) times daily.     furosemide 40 MG tablet  Commonly known as:  LASIX  Take 1 tablet (40 mg total) by mouth daily.     methadone 10 MG tablet  Commonly known as:  DOLOPHINE  Take 17 tablets (170 mg total) by mouth daily.     Potassium Chloride ER 20 MEQ Tbcr  Take 20 mEq by mouth daily.     sulfamethoxazole-trimethoprim 800-160 MG per tablet  Commonly known as:  SEPTRA DS  Take 1 tablet by mouth 2 (two) times daily.        ALLERGIES:  No Known Allergies  BRIEF HPI:  See H&P, Labs, Consult and Test reports for all details in brief, patient is a morbidly obese 53 year old male with history of chronic venous insufficiency and chronic stasis dermatitis of bilateral lower extremity, history of chronic pain syndrome on methadone, remote history of IV heroin use who presented to the hospital with left leg pain, swelling and erythema. Patient apparently was seen a few weeks prior to this admit at [facility], where a PICC line was placed and patient completed approximately 7 days  of outpatient vancomycin therapy on 4/5. After removal of the PICC line, patient continued to have left leg swelling, pain and erythema, as a result he presented to [facility] for further evaluation and treatment.   CONSULTATIONS:   None  PERTINENT RADIOLOGIC STUDIES: Dg Tibia/fibula Left  07/17/2013   CLINICAL DATA:  Left tibia and fibula region pain and drainage.  EXAM: LEFT TIBIA AND FIBULA - 2 VIEW  COMPARISON:  None.  FINDINGS: No fracture or bone lesion. No area of bone resorption is seen to suggest osteomyelitis. Knee and ankle joints are normally aligned There is diffuse soft tissue edema.  IMPRESSION: No fracture. No evidence of osteomyelitis. Diffuse soft tissue edema.   Electronically Signed   By: Lajean Manes M.D.   On: 07/17/2013 21:33     PERTINENT LAB RESULTS: CBC:  Recent Labs  07/18/13 0704 07/19/13 0644  WBC 9.2 9.3  HGB 11.1* 11.2*  HCT 33.4* 33.7*  PLT 409* 442*   CMET CMP     Component Value Date/Time   NA 138 07/20/2013 0534   K 4.0 07/20/2013 0534   CL 96 07/20/2013 0534   CO2 26 07/20/2013 0534   GLUCOSE 102* 07/20/2013 0534   BUN 11 07/20/2013 0534   CREATININE 0.79 07/20/2013 0534   CALCIUM 9.3 07/20/2013 0534   PROT 8.4* 07/19/2013 0644   ALBUMIN 2.8* 07/19/2013 3086  AST 18 07/19/2013 0644   ALT 24 07/19/2013 0644   ALKPHOS 55 07/19/2013 0644   BILITOT 0.3 07/19/2013 0644   GFRNONAA >90 07/20/2013 0534   GFRAA >90 07/20/2013 0534    GFR Estimated Creatinine Clearance: 190.5 ml/min (by C-G formula based on Cr of 0.79). No results found for this basename: LIPASE, AMYLASE,  in the last 72 hours No results found for this basename: CKTOTAL, CKMB, CKMBINDEX, TROPONINI,  in the last 72 hours No components found with this basename: POCBNP,  No results found for this basename: DDIMER,  in the last 72 hours No results found for this basename: HGBA1C,  in the last 72 hours No results found for this basename: CHOL, HDL, LDLCALC, TRIG, CHOLHDL, LDLDIRECT,  in the last 72  hours No results found for this basename: TSH, T4TOTAL, FREET3, T3FREE, THYROIDAB,  in the last 72 hours No results found for this basename: VITAMINB12, FOLATE, FERRITIN, TIBC, IRON, RETICCTPCT,  in the last 72 hours Coags: No results found for this basename: PT, INR,  in the last 72 hours Microbiology: No results found for this or any previous visit (from the past 240 hour(s)).   BRIEF HOSPITAL COURSE:   Principal Problem:   Cellulitis of left lower extremity - Patient presented with symptoms consistent with cellulitis of his left lower extremity. Patient has chronic skin changes from stasis dermatitis. As noted above, prior to this admission patient had completed outpatient IV antibiotics with vancomycin on 4/5. He had no significant improvement with vancomycin and as a result the patient presented to the hospital for further evaluation. On admission, patient was started on vancomycin and Zosyn on the cellulitic area was demarcated, he rapidly improved. He was also started on Lasix with significant reduction in the swelling. This morning, during rounds, patient requested discharge, he claims that his left leg Is almost back to his usual baseline. He has been afebrile, does not have leukocytosis. On exam, there is a previously demarcated area on his left knee and on his left dorsum of foot near the toes, there is no cellulitic area on the dorsum of the left foot or near his left knee, there is only some mild erythema in his left lower leg, there is extensive chronic skin changes. The area is not tender to touch all today. He has some scabs left anterior calf, and also some mild scabby area on the left posterior thigh. He will be discharged on Bactrim and doxycycline, [facility] has been arranged for 4/13, I have encouraged patient to keep this appointment. If persistent cellulitic changes are observed, patient may need referral to wound care center. Please note, a  left lower extremity Doppler ultrasound was negative for DVT.  Active Problems:   Venous stasis dermatitis - Chronic issue,a left lower extremity Doppler ultrasound was negative for DVT. Patient was encouraged to wear compression stockings.    Morbid obesity - Counseled regarding the importance of weight loss.    Chronic narcotic use - Continue with methadone  TODAY-DAY OF DISCHARGE:  Subjective:   [name] today has no headache,no chest abdominal pain,no new weakness tingling or numbness, feels much better wants to go home today.   Objective:   Blood pressure 130/82, pulse 83, temperature 98.4 F (36.9 C), temperature source Oral, resp. rate 20, height 6\' 1"  (1.854 m), weight 188.47 kg (415 lb 8 oz), SpO2 93.00%.  Intake/Output Summary (Last 24 hours) at 07/20/13 1036 Last data filed at 07/20/13 1010  Gross per 24 hour  Intake   1177 ml  Output      0 ml  Net   1177 ml   Filed Weights   07/17/13 1615 07/19/13 0507 07/20/13 0631  Weight: 192.2 kg (423 lb 11.6 oz) 191.055 kg (421 lb 3.2 oz) 188.47 kg (415 lb 8 oz)    Exam Awake Alert, Oriented *3, No new F.N deficits, Normal affect [place].AT,PERRAL Supple Neck,No JVD, No cervical lymphadenopathy appriciated.  Symmetrical Chest wall movement, Good air movement bilaterally, CTAB RRR,No Gallops,Rubs or new Murmurs, No Parasternal Heave +ve B.Sounds, Abd Soft, Non tender, No organomegaly appriciated, No rebound -guarding or rigidity. No Cyanosis, Clubbing or edema, No new Rash or bruise  DISCHARGE CONDITION: Stable  DISPOSITION: Home  DISCHARGE INSTRUCTIONS:    Activity:  As tolerated with Full fall precautions use walker/cane & assistance as needed  Diet recommendation: Heart Healthy diet       Discharge Orders   Future Appointments Provider Department Dept Phone   07/24/2013 10:15 AM [facility] Covering Provider [facility] [phone]   Future Orders Complete By Expires   Call MD  for:  As directed    Scheduling Instructions:   Worsening pain, swelling and redness in the left lower extremity   Diet - low sodium heart healthy  As directed    Increase activity slowly  As directed       Follow-up Information   Follow up with [facility]    . (appt Monday 07/24/13 at 10:00)    Contact information:   [place] [id number] [phone]      Total Time spent on discharge equals 45 minutes.  Signed: Henreitta Leber Chanse Kagel 07/20/2013 10:36 AM

## 2013-07-24 ENCOUNTER — Ambulatory Visit: Payer: Medicaid Other | Attending: Internal Medicine | Admitting: Internal Medicine

## 2013-07-24 ENCOUNTER — Encounter: Payer: Self-pay | Admitting: Internal Medicine

## 2013-07-24 VITALS — BP 135/70 | HR 87 | Temp 98.7°F | Resp 20 | Ht 73.0 in | Wt >= 6400 oz

## 2013-07-24 DIAGNOSIS — L03116 Cellulitis of left lower limb: Secondary | ICD-10-CM

## 2013-07-24 DIAGNOSIS — L0390 Cellulitis, unspecified: Secondary | ICD-10-CM

## 2013-07-24 DIAGNOSIS — I872 Venous insufficiency (chronic) (peripheral): Secondary | ICD-10-CM

## 2013-07-24 DIAGNOSIS — F1190 Opioid use, unspecified, uncomplicated: Secondary | ICD-10-CM

## 2013-07-24 DIAGNOSIS — L0291 Cutaneous abscess, unspecified: Secondary | ICD-10-CM

## 2013-07-24 DIAGNOSIS — F1110 Opioid abuse, uncomplicated: Secondary | ICD-10-CM

## 2013-07-24 DIAGNOSIS — I8310 Varicose veins of unspecified lower extremity with inflammation: Secondary | ICD-10-CM

## 2013-07-24 DIAGNOSIS — L03119 Cellulitis of unspecified part of limb: Principal | ICD-10-CM

## 2013-07-24 DIAGNOSIS — L02419 Cutaneous abscess of limb, unspecified: Secondary | ICD-10-CM

## 2013-07-24 MED ORDER — SULFAMETHOXAZOLE-TRIMETHOPRIM 800-160 MG PO TABS: 1.0000 | ORAL_TABLET | Freq: Two times a day (BID) | ORAL | Status: DC

## 2013-07-24 NOTE — Progress Notes (Signed)
Patient ID: [name], male   DOB: 1960/05/22, 53 y.o.   MRN: [id number]  CC: Left lower extremity cellulitis  HPI: 53 year old male with past medical history of venous stasis dermatitis, chronic narcotic use on methadone, recent diagnosis of left lower stomach is cellulitis for which he was on doxycycline and Bactrim. He only took this for a few days as his prescription was only for 3 days. His cellulitis is getting better but the left leg still exhibits quite significant redness with scaling.    No Known Allergies Past Medical History  Diagnosis Date  . Venous insufficiency of leg   . Morbid obesity   . Venous stasis dermatitis    Current Outpatient Prescriptions on File Prior to Visit  Medication Sig Dispense Refill  . methadone (DOLOPHINE) 10 MG tablet Take 17 tablets (170 mg total) by mouth daily.  17 tablet  0  . doxycycline (VIBRA-TABS) 100 MG tablet Take 1 tablet (100 mg total) by mouth 2 (two) times daily.  5 tablet  0  . furosemide (LASIX) 40 MG tablet Take 1 tablet (40 mg total) by mouth daily.  30 tablet  0  . potassium chloride 20 MEQ TBCR Take 20 mEq by mouth daily.  30 tablet  0   No current facility-administered medications on file prior to visit.   Family History  Problem Relation Age of Onset  . Diabetes Father   . Heart disease Father   . Hypertension Father   . Colon cancer Father   . Leukemia Mother     passed away 2008-08-03   History   Social History  . Marital Status: Legally Separated    Spouse Name: N/A    Number of Children: N/A  . Years of Education: N/A   Occupational History  . Not on file.   Social History Main Topics  . Smoking status: Current Every Day Smoker -- 3.00 packs/day for 35 years    Types: Cigarettes  . Smokeless tobacco: Never Used     Comment: currently only smoking 0.5 ppd  . Alcohol Use: No  . Drug Use: No     Comment: previous IV heroin user, 15 years in Methadone treatment at [facility]  . Sexual Activity:  Not on file   Other Topics Concern  . Not on file   Social History Narrative   Divorced man.  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  Lives in [place], [place].  4 children all in good health.  3 sisters in good health.  Father in poor health near by    Review of Systems  Constitutional: Negative for fever, chills, diaphoresis, activity change, appetite change and fatigue.  HENT: Negative for ear pain, nosebleeds, congestion, facial swelling, rhinorrhea, neck pain, neck stiffness and ear discharge.   Eyes: Negative for pain, discharge, redness, itching and visual disturbance.  Respiratory: Negative for cough, choking, chest tightness, shortness of breath, wheezing and stridor.   Cardiovascular: Negative for chest pain, palpitations and leg swelling.  Gastrointestinal: Negative for abdominal distention.  Genitourinary: Negative for dysuria, urgency, frequency, hematuria, flank pain, decreased urine volume, difficulty urinating and dyspareunia.  Musculoskeletal: Negative for back pain, joint swelling, arthralgias and gait problem.  Neurological: Negative for dizziness, tremors, seizures, syncope, facial asymmetry, speech difficulty, weakness, light-headedness, numbness and headaches.  Hematological: Negative for adenopathy. Does not bruise/bleed easily.  Psychiatric/Behavioral: Negative for hallucinations, behavioral problems, confusion, dysphoric mood, decreased concentration and agitation.    Objective:  Filed Vitals:   07/24/13 1047  BP: 135/70  Pulse: 87  Temp: 98.7 F (37.1 C)  Resp: 20    Physical Exam  Constitutional: Appears well-developed and well-nourished. No distress.  HENT: Normocephalic. External right and left ear normal. Oropharynx is clear and moist.  Eyes: Conjunctivae and EOM are normal. PERRLA, no scleral icterus.  Neck: Normal ROM. Neck supple. No JVD. No tracheal deviation. No thyromegaly.  CVS: RRR, S1/S2 +, no  murmurs, no gallops, no carotid bruit.  Pulmonary: Effort and breath sounds normal, no stridor, rhonchi, wheezes, rales.  Abdominal: Soft. BS +,  no distension, tenderness, rebound or guarding.  Musculoskeletal: Normal range of motion. Lower extremity edema +1-2, left lower extremity erythema and tenderness.  Lymphadenopathy: No lymphadenopathy noted, cervical, inguinal. Neuro: Alert. Normal reflexes, muscle tone coordination. No cranial nerve deficit. Skin: Chronic skin changes on the lower extremities with cellulitis on left lower extremity.  Psychiatric: Normal mood and affect. Behavior, judgment, thought content normal.   Lab Results  Component Value Date   WBC 9.3 07/19/2013   HGB 11.2* 07/19/2013   HCT 33.7* 07/19/2013   MCV 86.9 07/19/2013   PLT 442* 07/19/2013   Lab Results  Component Value Date   CREATININE 0.79 07/20/2013   BUN 11 07/20/2013   NA 138 07/20/2013   K 4.0 07/20/2013   CL 96 07/20/2013   CO2 26 07/20/2013    Lab Results  Component Value Date   HGBA1C 5.5 09/20/2012   Lipid Panel  No results found for this basename: chol, trig, hdl, cholhdl, vldl, ldlcalc       Assessment and plan:   Patient Active Problem List   Diagnosis Date Noted  . Chronic narcotic use 07/18/2013    Priority: Medium  - on methadone   . Cellulitis of left lower extremity 07/17/2013    Priority: Medium - Would recommend patient continues Bactrim for additional 12 days with instructions to followup with [facility].   . Venous stasis dermatitis     Priority: Medium - [facility] given [facility] for further evaluation and management

## 2013-07-24 NOTE — Patient Instructions (Signed)
Venous Stasis or Chronic Venous Insufficiency Chronic venous insufficiency, also called venous stasis, is a condition that affects the veins in the legs. The condition prevents blood from being pumped through these veins effectively. Blood may no longer be pumped effectively from the legs back to the heart. This condition can range from mild to severe. With proper treatment, you should be able to continue with an active life. CAUSES  Chronic venous insufficiency occurs when the vein walls become stretched, weakened, or damaged or when valves within the vein are damaged. Some common causes of this include:  High blood pressure inside the veins (venous hypertension).  Increased blood pressure in the leg veins from long periods of sitting or standing.  A blood clot that blocks blood flow in a vein (deep vein thrombosis).  Inflammation of a superficial vein (phlebitis) that causes a blood clot to form. RISK FACTORS Various things can make you more likely to develop chronic venous insufficiency, including:  Family history of this condition.  Obesity.  Pregnancy.  Sedentary lifestyle.  Smoking.  Jobs requiring long periods of standing or sitting in one place.  Being a certain age. Women in their 44s and 91s and men in their 56s are more likely to develop this condition. SIGNS AND SYMPTOMS  Symptoms may include:   Varicose veins.  Skin breakdown or ulcers.  Reddened or discolored skin on the leg.  Brown, smooth, tight, and painful skin just above the ankle, usually on the inside surface (lipodermatosclerosis).  Swelling. DIAGNOSIS  To diagnose this condition, your health care provider will take a medical history and do a physical exam. The following tests may be ordered to confirm the diagnosis:  Duplex ultrasound A procedure that produces a picture of a blood vessel and nearby organs and also provides information on blood flow through the blood vessel.  Plethysmography A  procedure that tests blood flow.  A venogram, or venography A procedure used to look at the veins using X-ray and dye. TREATMENT The goals of treatment are to help you return to an active life and to minimize pain or disability. Treatment will depend on the severity of the condition. Medical procedures may be needed for severe cases. Treatment options may include:   Use of compression stockings. These can help with symptoms and lower the chances of the problem getting worse, but they do not cure the problem.  Sclerotherapy A procedure involving an injection of a material that "dissolves" the damaged veins. Other veins in the network of blood vessels take over the function of the damaged veins.  Surgery to remove the vein or cut off blood flow through the vein (vein stripping or laser ablation surgery).  Surgery to repair a valve. HOME CARE INSTRUCTIONS   Wear compression stockings as directed by your health care provider.  Only take over-the-counter or prescription medicines for pain, discomfort, or fever as directed by your health care provider.  Follow up with your health care provider as directed. SEEK MEDICAL CARE IF:   You have redness, swelling, or increasing pain in the affected area.  You see a red streak or line that extends up or down from the affected area.  You have a breakdown or loss of skin in the affected area, even if the breakdown is small.  You have an injury to the affected area. SEEK IMMEDIATE MEDICAL CARE IF:   You have an injury and open wound in the affected area.  Your pain is severe and does not improve with  medicine.  You have sudden numbness or weakness in the foot or ankle below the affected area, or you have trouble moving your foot or ankle.  You have a fever or persistent symptoms for more than 2 3 days.  You have a fever and your symptoms suddenly get worse. MAKE SURE YOU:   Understand these instructions.  Will watch your condition.  Will  get help right away if you are not doing well or get worse. Document Released: 08/03/2006 Document Revised: 01/18/2013 Document Reviewed: 12/05/2012 [facility] Patient Information 2014 [place].

## 2013-07-24 NOTE — Progress Notes (Signed)
Patient here to establish care. Patient with left lower leg cellulitis. Pain 7/10. Hospitalized at [facility] 2 weeks ago Hospitalized at [facility] 07/17/13-07/21/13 for IV antibiotics. Completed course of doxycycline and septra DS.   Not taking lasix or potassium because he does not feel it works. Patient interested in getting a colonoscopy because of family history.

## 2013-08-07 ENCOUNTER — Other Ambulatory Visit: Payer: Medicaid Other

## 2013-08-08 ENCOUNTER — Other Ambulatory Visit: Payer: Medicaid Other

## 2014-08-11 ENCOUNTER — Encounter (HOSPITAL_COMMUNITY): Payer: Self-pay | Admitting: *Deleted

## 2014-08-11 ENCOUNTER — Inpatient Hospital Stay (HOSPITAL_COMMUNITY)
Admission: EM | Admit: 2014-08-11 | Discharge: 2014-08-20 | DRG: 871 | Disposition: A | Discharge status: Home Health | Payer: Medicaid Other | Attending: Internal Medicine | Admitting: Internal Medicine

## 2014-08-11 DIAGNOSIS — R1084 Generalized abdominal pain: Secondary | ICD-10-CM

## 2014-08-11 DIAGNOSIS — F1190 Opioid use, unspecified, uncomplicated: Secondary | ICD-10-CM | POA: Diagnosis present

## 2014-08-11 DIAGNOSIS — Z79891 Long term (current) use of opiate analgesic: Secondary | ICD-10-CM

## 2014-08-11 DIAGNOSIS — R7881 Bacteremia: Secondary | ICD-10-CM | POA: Diagnosis present

## 2014-08-11 DIAGNOSIS — G8929 Other chronic pain: Secondary | ICD-10-CM | POA: Diagnosis present

## 2014-08-11 DIAGNOSIS — J441 Chronic obstructive pulmonary disease with (acute) exacerbation: Secondary | ICD-10-CM | POA: Diagnosis present

## 2014-08-11 DIAGNOSIS — L03119 Cellulitis of unspecified part of limb: Secondary | ICD-10-CM

## 2014-08-11 DIAGNOSIS — K5900 Constipation, unspecified: Secondary | ICD-10-CM | POA: Diagnosis present

## 2014-08-11 DIAGNOSIS — I872 Venous insufficiency (chronic) (peripheral): Secondary | ICD-10-CM | POA: Diagnosis present

## 2014-08-11 DIAGNOSIS — A408 Other streptococcal sepsis: Principal | ICD-10-CM | POA: Diagnosis present

## 2014-08-11 DIAGNOSIS — L02419 Cutaneous abscess of limb, unspecified: Secondary | ICD-10-CM | POA: Diagnosis present

## 2014-08-11 DIAGNOSIS — A419 Sepsis, unspecified organism: Secondary | ICD-10-CM | POA: Diagnosis present

## 2014-08-11 DIAGNOSIS — E119 Type 2 diabetes mellitus without complications: Secondary | ICD-10-CM | POA: Diagnosis present

## 2014-08-11 DIAGNOSIS — E662 Morbid (severe) obesity with alveolar hypoventilation: Secondary | ICD-10-CM | POA: Diagnosis present

## 2014-08-11 DIAGNOSIS — Z6841 Body Mass Index (BMI) 40.0 and over, adult: Secondary | ICD-10-CM

## 2014-08-11 DIAGNOSIS — L02619 Cutaneous abscess of unspecified foot: Secondary | ICD-10-CM | POA: Insufficient documentation

## 2014-08-11 DIAGNOSIS — L03116 Cellulitis of left lower limb: Secondary | ICD-10-CM | POA: Diagnosis present

## 2014-08-11 DIAGNOSIS — I10 Essential (primary) hypertension: Secondary | ICD-10-CM | POA: Diagnosis present

## 2014-08-11 DIAGNOSIS — F17210 Nicotine dependence, cigarettes, uncomplicated: Secondary | ICD-10-CM | POA: Diagnosis present

## 2014-08-11 DIAGNOSIS — E876 Hypokalemia: Secondary | ICD-10-CM | POA: Diagnosis not present

## 2014-08-11 DIAGNOSIS — E872 Acidosis: Secondary | ICD-10-CM | POA: Diagnosis present

## 2014-08-11 DIAGNOSIS — R300 Dysuria: Secondary | ICD-10-CM | POA: Diagnosis present

## 2014-08-11 DIAGNOSIS — D6959 Other secondary thrombocytopenia: Secondary | ICD-10-CM | POA: Diagnosis not present

## 2014-08-11 DIAGNOSIS — L0390 Cellulitis, unspecified: Secondary | ICD-10-CM

## 2014-08-11 DIAGNOSIS — E871 Hypo-osmolality and hyponatremia: Secondary | ICD-10-CM | POA: Diagnosis present

## 2014-08-11 DIAGNOSIS — F1110 Opioid abuse, uncomplicated: Secondary | ICD-10-CM | POA: Diagnosis present

## 2014-08-11 DIAGNOSIS — J9601 Acute respiratory failure with hypoxia: Secondary | ICD-10-CM | POA: Diagnosis present

## 2014-08-11 HISTORY — DX: Heart failure, unspecified: I50.9

## 2014-08-11 MED ORDER — MORPHINE SULFATE 4 MG/ML IJ SOLN
4.0000 mg | Freq: Once | INTRAMUSCULAR | Status: AC
  Administered 2014-08-11: 4 mg via INTRAVENOUS
  Filled 2014-08-11: qty 1

## 2014-08-11 MED ORDER — ONDANSETRON HCL 4 MG/2ML IJ SOLN
4.0000 mg | Freq: Once | INTRAMUSCULAR | Status: AC
  Administered 2014-08-11: 4 mg via INTRAVENOUS
  Filled 2014-08-11: qty 2

## 2014-08-11 NOTE — ED Notes (Signed)
Per EMS: coming from home with c/o right lower abdominal pain, reports dysuria, hx of kidney stone, n/v, feels similar to kidney stone. 4 zofran, 800 ibuprofen

## 2014-08-11 NOTE — ED Provider Notes (Signed)
CSN: [id number]     Arrival date & time    History   This chart was scribed for Linton Flemings, MD by Chester Holstein, ED Scribe. This patient was seen in room D32C/D32C and the patient's care was started at 11:41 PM.    Chief Complaint  Patient presents with  . Abdominal Pain    Patient is a 54 y.o. male presenting with abdominal pain. The history is provided by the patient and the EMS personnel. No language interpreter was used.  Abdominal Pain Associated symptoms: dysuria, fever, nausea and vomiting   Associated symptoms: no chills and no cough    HPI Comments: [name] is a 54 y.o. male brought in by ambulance, with PMHx of DM, HTN, morbid obesity and venous stasis dermatitis who presents to the Emergency Department complaining of constant right sided abdominal pain with acute onset "around dinner time" approximately 6-7 hours PTA. Pt reports h/o renal calculi and gallstones. He states current pain feels similar to previous pain from renal calculi. Pt notes associated fever with onset yesterday. Per nursing note pt reports associated dysuria, nausea, and vomiting. Pt denies h/o diverticulitis, diverticulosis, or abdominal surgery. H/o chronic narcotic use. Pt states he is very thirsty. Pt has NKDA. He denies recent sick contacts. Pt is not utd on flu shot. Pt denies back pain, cough, sores, and increased leg swelling or redness different than baseline.   Past Medical History  Diagnosis Date  . Venous insufficiency of leg   . Morbid obesity   . Venous stasis dermatitis   . CHF (congestive heart failure)    Past Surgical History  Procedure Laterality Date  . Carpal tunnel release      bilateral   . Eye surgery     Family History  Problem Relation Age of Onset  . Diabetes Father   . Heart disease Father   . Hypertension Father   . Colon cancer Father   . Leukemia Mother     passed away Jul 17, 2008   History  Substance Use Topics  . Smoking status: Current Every Day Smoker -- 3.00  packs/day for 35 years    Types: Cigarettes  . Smokeless tobacco: Never Used     Comment: currently only smoking 0.5 ppd  . Alcohol Use: No    Review of Systems  Constitutional: Positive for fever. Negative for chills.  Respiratory: Negative for cough.   Gastrointestinal: Positive for nausea, vomiting and abdominal pain.  Genitourinary: Positive for dysuria.  Musculoskeletal: Negative for back pain.      Allergies  Review of patient's allergies indicates no known allergies.  Home Medications   Prior to Admission medications   Medication Sig Start Date End Date Taking? Authorizing Provider  doxycycline (VIBRA-TABS) 100 MG tablet Take 1 tablet (100 mg total) by mouth 2 (two) times daily. 07/20/13   Shanker Kristeen Mans, MD  furosemide (LASIX) 40 MG tablet Take 1 tablet (40 mg total) by mouth daily. 07/20/13   Shanker Kristeen Mans, MD  methadone (DOLOPHINE) 10 MG tablet Take 17 tablets (170 mg total) by mouth daily. 09/24/12   Emory Carollee Leitz, MD  potassium chloride 20 MEQ TBCR Take 20 mEq by mouth daily. 07/20/13   Shanker Kristeen Mans, MD  sulfamethoxazole-trimethoprim (SEPTRA DS) 800-160 MG per tablet Take 1 tablet by mouth 2 (two) times daily. 07/24/13   Robbie Lis, MD   BP 94/64 mmHg  Pulse 25  Temp(Src) 102.6 F (39.2 C) (Oral)  Resp 24  SpO2 92% Physical  Exam  Constitutional: He is oriented to person, place, and time. He appears well-developed and well-nourished. He appears distressed.  Morbidly obese male, uncomfortable  HENT:  Head: Normocephalic.  Eyes: Conjunctivae are normal.  Neck: Normal range of motion. Neck supple.  Cardiovascular: Normal heart sounds and intact distal pulses.  Exam reveals no gallop and no friction rub.   No murmur heard. tachycardia  Pulmonary/Chest: Effort normal and breath sounds normal. No respiratory distress. He has no wheezes. He has no rales. He exhibits no tenderness.  Abdominal: Soft. He exhibits no mass. There is tenderness (ttp from right  upper to right lower as well as epigastric). There is no rebound and no guarding.  Musculoskeletal: Normal range of motion. He exhibits edema.  Chronic changes of venous stasis  Neurological: He is alert and oriented to person, place, and time.  Skin: Skin is warm and dry.  Psychiatric: He has a normal mood and affect. His behavior is normal.  Nursing note and vitals reviewed.   ED Course  Procedures (including critical care time) DIAGNOSTIC STUDIES: Oxygen Saturation is 97% on room air, normal by my interpretation.    COORDINATION OF CARE: 11:45 PM Discussed treatment plan with patient at beside, the patient agrees with the plan and has no further questions at this time.   Labs Review Labs Reviewed  COMPREHENSIVE METABOLIC PANEL - Abnormal; Notable for the following:    Sodium 131 (*)    Chloride 97 (*)    Glucose, Bld 147 (*)    All other components within normal limits  CBC WITH DIFFERENTIAL/PLATELET - Abnormal; Notable for the following:    WBC 16.2 (*)    Neutrophils Relative % 92 (*)    Neutro Abs 15.0 (*)    Lymphocytes Relative 4 (*)    Lymphs Abs 0.6 (*)    All other components within normal limits  I-STAT CG4 LACTIC ACID, ED - Abnormal; Notable for the following:    Lactic Acid, Venous 3.30 (*)    All other components within normal limits  CULTURE, BLOOD (ROUTINE X 2)  CULTURE, BLOOD (ROUTINE X 2)  URINE CULTURE  URINALYSIS, ROUTINE W REFLEX MICROSCOPIC  LIPASE, BLOOD    Imaging Review Ct Abdomen Pelvis W Contrast  08/12/2014   CLINICAL DATA:  Acute onset of generalized abdominal pain and back pain. Dysuria. Nausea and vomiting. Initial encounter.  EXAM: CT ABDOMEN AND PELVIS WITH CONTRAST  TECHNIQUE: Multidetector CT imaging of the abdomen and pelvis was performed using the standard protocol following bolus administration of intravenous contrast.  CONTRAST:  100 mL of Omnipaque 300 IV contrast  COMPARISON:  CT of the abdomen and pelvis from 07/12/2011  FINDINGS:  The visualized lung bases are clear.  There is diffuse fatty infiltration within the liver. The liver and spleen are otherwise unremarkable. The gallbladder is within normal limits. The pancreas and adrenal glands are unremarkable.  A 2.9 cm cyst is noted at the upper pole of the right kidney. Mild nonspecific perinephric stranding is noted bilaterally. A nonobstructing 1.0 cm stone is noted at the lower pole of the left kidney. The kidneys are otherwise unremarkable. There is no evidence of hydronephrosis. No obstructing ureteral stones are identified.  No free fluid is identified. The small bowel is unremarkable in appearance. The stomach is within normal limits. No acute vascular abnormalities are seen. Mild calcification is noted along the abdominal aorta.  The appendix is normal in caliber, without evidence of appendicitis. The colon is unremarkable in appearance.  The bladder is mildly distended and grossly unremarkable in appearance. The prostate is diminutive and grossly unremarkable. No inguinal lymphadenopathy is seen.  No acute osseous abnormalities are identified. Mild degenerative change is noted at the pubic symphysis. Multilevel vacuum phenomenon is noted along the lower thoracic and lumbar spine.  IMPRESSION: 1. No acute abnormality seen to explain the patient's symptoms. 2. Nonobstructing 1.0 cm stone at the lower pole of the left kidney. 3. No evidence of hydronephrosis. Small right renal cyst noted. 4. Diffuse fatty infiltration within the liver.   Electronically Signed   By: Garald Balding M.D.   On: 08/12/2014 01:48     EKG Interpretation None     Meds ordered this encounter  Medications  . ondansetron (ZOFRAN) injection 4 mg    Sig:   . morphine 4 MG/ML injection 4 mg    Sig:   . iohexol (OMNIPAQUE) 300 MG/ML solution 25 mL    Sig:   . HYDROmorphone (DILAUDID) injection 1 mg    Sig:   . iohexol (OMNIPAQUE) 300 MG/ML solution 100 mL    Sig:   . sodium chloride 0.9 % bolus  3,000 mL    Sig:   . HYDROmorphone (DILAUDID) injection 1 mg    Sig:   . DISCONTD: acetaminophen (TYLENOL) tablet 325 mg    Sig:   . acetaminophen (TYLENOL) tablet 1,000 mg    Sig:     2:31 AM Dr. Sharol Given in to discuss CT results with pt. He reports abdominal pain is partially resolved.   2:32 AM-Consult complete with Dr Posey Pronto. Patient case explained and discussed. Dr. Posey Pronto agrees to admit patient for further evaluation and treatment. Call ended at 2:35. Consulted with Dr. Rosendo Gros, general surgery.    MDM   Final diagnoses:  Sepsis  Generalized abdominal pain  Chronic narcotic use  Venous stasis dermatitis of both lower extremities  Morbid obesity   54 yo male, overall very poor historian with right sided abdominal pain, fever for unknown amount of days.  Pt feels that he may have another kidney stone, but has no back pain, no radiation into groin.     I personally performed the services described in this documentation, which was scribed in my presence. The recorded information has been reviewed and is accurate.      Linton Flemings, MD 08/12/14 [phone]

## 2014-08-12 ENCOUNTER — Emergency Department (HOSPITAL_COMMUNITY): Payer: Medicaid Other

## 2014-08-12 ENCOUNTER — Inpatient Hospital Stay (HOSPITAL_COMMUNITY): Payer: Medicaid Other

## 2014-08-12 ENCOUNTER — Encounter (HOSPITAL_COMMUNITY): Payer: Self-pay | Admitting: Radiology

## 2014-08-12 DIAGNOSIS — R300 Dysuria: Secondary | ICD-10-CM | POA: Diagnosis present

## 2014-08-12 DIAGNOSIS — D6959 Other secondary thrombocytopenia: Secondary | ICD-10-CM | POA: Diagnosis not present

## 2014-08-12 DIAGNOSIS — L03115 Cellulitis of right lower limb: Secondary | ICD-10-CM

## 2014-08-12 DIAGNOSIS — Z79891 Long term (current) use of opiate analgesic: Secondary | ICD-10-CM | POA: Diagnosis not present

## 2014-08-12 DIAGNOSIS — L03119 Cellulitis of unspecified part of limb: Secondary | ICD-10-CM

## 2014-08-12 DIAGNOSIS — E871 Hypo-osmolality and hyponatremia: Secondary | ICD-10-CM | POA: Diagnosis present

## 2014-08-12 DIAGNOSIS — F17210 Nicotine dependence, cigarettes, uncomplicated: Secondary | ICD-10-CM | POA: Diagnosis present

## 2014-08-12 DIAGNOSIS — I872 Venous insufficiency (chronic) (peripheral): Secondary | ICD-10-CM | POA: Diagnosis present

## 2014-08-12 DIAGNOSIS — L03116 Cellulitis of left lower limb: Secondary | ICD-10-CM | POA: Diagnosis present

## 2014-08-12 DIAGNOSIS — G8929 Other chronic pain: Secondary | ICD-10-CM | POA: Diagnosis present

## 2014-08-12 DIAGNOSIS — A408 Other streptococcal sepsis: Secondary | ICD-10-CM | POA: Diagnosis present

## 2014-08-12 DIAGNOSIS — J441 Chronic obstructive pulmonary disease with (acute) exacerbation: Secondary | ICD-10-CM | POA: Diagnosis present

## 2014-08-12 DIAGNOSIS — A419 Sepsis, unspecified organism: Secondary | ICD-10-CM | POA: Diagnosis present

## 2014-08-12 DIAGNOSIS — I8311 Varicose veins of right lower extremity with inflammation: Secondary | ICD-10-CM

## 2014-08-12 DIAGNOSIS — E119 Type 2 diabetes mellitus without complications: Secondary | ICD-10-CM | POA: Diagnosis present

## 2014-08-12 DIAGNOSIS — E662 Morbid (severe) obesity with alveolar hypoventilation: Secondary | ICD-10-CM | POA: Diagnosis present

## 2014-08-12 DIAGNOSIS — Z6841 Body Mass Index (BMI) 40.0 and over, adult: Secondary | ICD-10-CM | POA: Diagnosis not present

## 2014-08-12 DIAGNOSIS — F1190 Opioid use, unspecified, uncomplicated: Secondary | ICD-10-CM

## 2014-08-12 DIAGNOSIS — J9601 Acute respiratory failure with hypoxia: Secondary | ICD-10-CM | POA: Diagnosis present

## 2014-08-12 DIAGNOSIS — E876 Hypokalemia: Secondary | ICD-10-CM | POA: Diagnosis not present

## 2014-08-12 DIAGNOSIS — I8312 Varicose veins of left lower extremity with inflammation: Secondary | ICD-10-CM

## 2014-08-12 DIAGNOSIS — K5900 Constipation, unspecified: Secondary | ICD-10-CM | POA: Diagnosis present

## 2014-08-12 DIAGNOSIS — L02419 Cutaneous abscess of limb, unspecified: Secondary | ICD-10-CM

## 2014-08-12 DIAGNOSIS — F1110 Opioid abuse, uncomplicated: Secondary | ICD-10-CM | POA: Diagnosis present

## 2014-08-12 DIAGNOSIS — E872 Acidosis: Secondary | ICD-10-CM | POA: Diagnosis present

## 2014-08-12 DIAGNOSIS — I10 Essential (primary) hypertension: Secondary | ICD-10-CM | POA: Diagnosis present

## 2014-08-12 DIAGNOSIS — L0390 Cellulitis, unspecified: Secondary | ICD-10-CM

## 2014-08-12 DIAGNOSIS — R109 Unspecified abdominal pain: Secondary | ICD-10-CM | POA: Diagnosis present

## 2014-08-12 LAB — COMPREHENSIVE METABOLIC PANEL
ALK PHOS: 51 U/L (ref 38–126)
ALK PHOS: 56 U/L (ref 38–126)
ALT: 25 U/L (ref 17–63)
ALT: 31 U/L (ref 17–63)
AST: 31 U/L (ref 15–41)
AST: 36 U/L (ref 15–41)
Albumin: 3.5 g/dL (ref 3.5–5.0)
Albumin: 4 g/dL (ref 3.5–5.0)
Anion gap: 10 (ref 5–15)
Anion gap: 9 (ref 5–15)
BILIRUBIN TOTAL: 0.7 mg/dL (ref 0.3–1.2)
BUN: 10 mg/dL (ref 6–20)
BUN: 9 mg/dL (ref 6–20)
CO2: 23 mmol/L (ref 22–32)
CO2: 25 mmol/L (ref 22–32)
Calcium: 8.4 mg/dL — ABNORMAL LOW (ref 8.9–10.3)
Calcium: 8.9 mg/dL (ref 8.9–10.3)
Chloride: 100 mmol/L — ABNORMAL LOW (ref 101–111)
Chloride: 97 mmol/L — ABNORMAL LOW (ref 101–111)
Creatinine, Ser: 0.7 mg/dL (ref 0.61–1.24)
Creatinine, Ser: 0.83 mg/dL (ref 0.61–1.24)
GFR calc Af Amer: >90 mL/min (ref >60)
GFR calc non Af Amer: >90 mL/min (ref >60)
GLUCOSE: 108 mg/dL — AB (ref 70–99)
GLUCOSE: 147 mg/dL — AB (ref 70–99)
POTASSIUM: 3.5 mmol/L (ref 3.5–5.1)
POTASSIUM: 3.7 mmol/L (ref 3.5–5.1)
SODIUM: 131 mmol/L — AB (ref 135–145)
Sodium: 133 mmol/L — ABNORMAL LOW (ref 135–145)
TOTAL PROTEIN: 7.7 g/dL (ref 6.5–8.1)
Total Bilirubin: 0.7 mg/dL (ref 0.3–1.2)
Total Protein: 7 g/dL (ref 6.5–8.1)

## 2014-08-12 LAB — C-REACTIVE PROTEIN: CRP: 6.6 mg/dL — AB (ref <1.0)

## 2014-08-12 LAB — CBC WITH DIFFERENTIAL/PLATELET
BASOS ABS: 0 10*3/uL (ref 0.0–0.1)
Basophils Absolute: 0 10*3/uL (ref 0.0–0.1)
Basophils Relative: 0 % (ref 0–1)
Basophils Relative: 0 % (ref 0–1)
Eosinophils Absolute: 0 10*3/uL (ref 0.0–0.7)
Eosinophils Absolute: 0 10*3/uL (ref 0.0–0.7)
Eosinophils Relative: 0 % (ref 0–5)
Eosinophils Relative: 0 % (ref 0–5)
HCT: 35.9 % — ABNORMAL LOW (ref 39.0–52.0)
HCT: 40.8 % (ref 39.0–52.0)
Hemoglobin: 12.3 g/dL — ABNORMAL LOW (ref 13.0–17.0)
Hemoglobin: 13.7 g/dL (ref 13.0–17.0)
LYMPHS ABS: 0.6 10*3/uL — AB (ref 0.7–4.0)
Lymphocytes Relative: 2 % — ABNORMAL LOW (ref 12–46)
Lymphocytes Relative: 4 % — ABNORMAL LOW (ref 12–46)
Lymphs Abs: 0.4 10*3/uL — ABNORMAL LOW (ref 0.7–4.0)
MCH: 29 pg (ref 26.0–34.0)
MCH: 29.1 pg (ref 26.0–34.0)
MCHC: 33.6 g/dL (ref 30.0–36.0)
MCHC: 34.3 g/dL (ref 30.0–36.0)
MCV: 84.9 fL (ref 78.0–100.0)
MCV: 86.4 fL (ref 78.0–100.0)
Monocytes Absolute: 0.4 10*3/uL (ref 0.1–1.0)
Monocytes Absolute: 0.6 10*3/uL (ref 0.1–1.0)
Monocytes Relative: 2 % — ABNORMAL LOW (ref 3–12)
Monocytes Relative: 4 % (ref 3–12)
NEUTROS ABS: 19.3 10*3/uL — AB (ref 1.7–7.7)
Neutro Abs: 15 10*3/uL — ABNORMAL HIGH (ref 1.7–7.7)
Neutrophils Relative %: 92 % — ABNORMAL HIGH (ref 43–77)
Neutrophils Relative %: 96 % — ABNORMAL HIGH (ref 43–77)
PLATELETS: 174 10*3/uL (ref 150–400)
Platelets: 194 10*3/uL (ref 150–400)
RBC: 4.23 MIL/uL (ref 4.22–5.81)
RBC: 4.72 MIL/uL (ref 4.22–5.81)
RDW: 13.7 % (ref 11.5–15.5)
RDW: 13.8 % (ref 11.5–15.5)
WBC: 16.2 10*3/uL — ABNORMAL HIGH (ref 4.0–10.5)
WBC: 20 10*3/uL — AB (ref 4.0–10.5)

## 2014-08-12 LAB — URINALYSIS, ROUTINE W REFLEX MICROSCOPIC
BILIRUBIN URINE: NEGATIVE
Glucose, UA: NEGATIVE mg/dL
Hgb urine dipstick: NEGATIVE
Ketones, ur: NEGATIVE mg/dL
Leukocytes, UA: NEGATIVE
Nitrite: NEGATIVE
PROTEIN: NEGATIVE mg/dL
Specific Gravity, Urine: 1.013 (ref 1.005–1.030)
UROBILINOGEN UA: 0.2 mg/dL (ref 0.0–1.0)
pH: 8 (ref 5.0–8.0)

## 2014-08-12 LAB — LIPASE, BLOOD: Lipase: 30 U/L (ref 22–51)

## 2014-08-12 LAB — SEDIMENTATION RATE: SED RATE: 31 mm/h — AB (ref 0–16)

## 2014-08-12 LAB — INFLUENZA PANEL BY PCR (TYPE A & B)
H1N1 flu by pcr: NOT DETECTED
INFLBPCR: NEGATIVE
Influenza A By PCR: NEGATIVE

## 2014-08-12 LAB — BRAIN NATRIURETIC PEPTIDE: B Natriuretic Peptide: 164.6 pg/mL — ABNORMAL HIGH (ref 0.0–100.0)

## 2014-08-12 LAB — TSH: TSH: 1.095 u[IU]/mL (ref 0.350–4.500)

## 2014-08-12 LAB — PROCALCITONIN: PROCALCITONIN: 6.02 ng/mL

## 2014-08-12 LAB — PROTIME-INR
INR: 1.2 (ref 0.00–1.49)
PROTHROMBIN TIME: 15.3 s — AB (ref 11.6–15.2)

## 2014-08-12 LAB — I-STAT CG4 LACTIC ACID, ED
Lactic Acid, Venous: 1.63 mmol/L (ref 0.5–2.0)
Lactic Acid, Venous: 3.3 mmol/L (ref 0.5–2.0)

## 2014-08-12 LAB — ETHANOL: Alcohol, Ethyl (B): <5 mg/dL (ref <5)

## 2014-08-12 MED ORDER — IPRATROPIUM-ALBUTEROL 0.5-2.5 (3) MG/3ML IN SOLN: 3.0000 mL | RESPIRATORY_TRACT | Status: DC

## 2014-08-12 MED ORDER — ONDANSETRON HCL 4 MG/2ML IJ SOLN
4.0000 mg | Freq: Four times a day (QID) | INTRAMUSCULAR | Status: DC | PRN
  Administered 2014-08-12 (×2): 4 mg via INTRAVENOUS
  Filled 2014-08-12 (×2): qty 2

## 2014-08-12 MED ORDER — PNEUMOCOCCAL VAC POLYVALENT 25 MCG/0.5ML IJ INJ: 0.5000 mL | INJECTION | INTRAMUSCULAR | Status: DC

## 2014-08-12 MED ORDER — SODIUM CHLORIDE 0.9 % IV SOLN
INTRAVENOUS | Status: DC
  Administered 2014-08-12 (×2): via INTRAVENOUS

## 2014-08-12 MED ORDER — ONDANSETRON HCL 4 MG PO TABS: 4.0000 mg | ORAL_TABLET | Freq: Four times a day (QID) | ORAL | Status: DC | PRN

## 2014-08-12 MED ORDER — VANCOMYCIN HCL IN DEXTROSE 1-5 GM/200ML-% IV SOLN
1000.0000 mg | Freq: Once | INTRAVENOUS | Status: DC
Start: 2014-08-12 — End: 2014-08-12

## 2014-08-12 MED ORDER — SODIUM CHLORIDE 0.9 % IV BOLUS (SEPSIS)
3000.0000 mL | Freq: Once | INTRAVENOUS | Status: AC
  Administered 2014-08-12: 3000 mL via INTRAVENOUS

## 2014-08-12 MED ORDER — ACETAMINOPHEN 500 MG PO TABS
1000.0000 mg | ORAL_TABLET | Freq: Once | ORAL | Status: AC
  Administered 2014-08-12: 1000 mg via ORAL
  Filled 2014-08-12: qty 2

## 2014-08-12 MED ORDER — ACETAMINOPHEN 325 MG PO TABS
650.0000 mg | ORAL_TABLET | Freq: Four times a day (QID) | ORAL | Status: DC | PRN
  Administered 2014-08-12 – 2014-08-15 (×4): 650 mg via ORAL
  Filled 2014-08-12 (×4): qty 2

## 2014-08-12 MED ORDER — VANCOMYCIN HCL 10 G IV SOLR
1500.0000 mg | Freq: Three times a day (TID) | INTRAVENOUS | Status: DC
  Administered 2014-08-12 – 2014-08-13 (×4): 1500 mg via INTRAVENOUS
  Filled 2014-08-12 (×6): qty 1500

## 2014-08-12 MED ORDER — HYDROMORPHONE HCL 1 MG/ML IJ SOLN
1.0000 mg | INTRAMUSCULAR | Status: DC | PRN
  Administered 2014-08-12 – 2014-08-14 (×7): 1 mg via INTRAVENOUS
  Filled 2014-08-12 (×7): qty 1

## 2014-08-12 MED ORDER — HYDROMORPHONE HCL 1 MG/ML IJ SOLN
INTRAMUSCULAR | Status: AC
  Administered 2014-08-12: 1 mg
  Filled 2014-08-12: qty 1

## 2014-08-12 MED ORDER — PIPERACILLIN-TAZOBACTAM 3.375 G IVPB
3.3750 g | Freq: Three times a day (TID) | INTRAVENOUS | Status: DC
  Administered 2014-08-12 – 2014-08-13 (×5): 3.375 g via INTRAVENOUS
  Filled 2014-08-12 (×7): qty 50

## 2014-08-12 MED ORDER — HYDROMORPHONE HCL 1 MG/ML IJ SOLN: 1.0000 mg | Freq: Once | INTRAMUSCULAR | Status: DC

## 2014-08-12 MED ORDER — VANCOMYCIN HCL 10 G IV SOLR
2000.0000 mg | Freq: Once | INTRAVENOUS | Status: AC
  Administered 2014-08-12: 2000 mg via INTRAVENOUS
  Filled 2014-08-12: qty 2000

## 2014-08-12 MED ORDER — IPRATROPIUM-ALBUTEROL 0.5-2.5 (3) MG/3ML IN SOLN
3.0000 mL | Freq: Four times a day (QID) | RESPIRATORY_TRACT | Status: DC
  Administered 2014-08-12 – 2014-08-17 (×18): 3 mL via RESPIRATORY_TRACT
  Filled 2014-08-12 (×14): qty 3
  Filled 2014-08-12 (×2): qty 39
  Filled 2014-08-12 (×4): qty 3

## 2014-08-12 MED ORDER — GUAIFENESIN ER 600 MG PO TB12
600.0000 mg | ORAL_TABLET | Freq: Two times a day (BID) | ORAL | Status: DC
  Administered 2014-08-12 – 2014-08-20 (×14): 600 mg via ORAL
  Filled 2014-08-12 (×18): qty 1

## 2014-08-12 MED ORDER — ALBUTEROL SULFATE (2.5 MG/3ML) 0.083% IN NEBU
2.5000 mg | INHALATION_SOLUTION | RESPIRATORY_TRACT | Status: DC | PRN
  Administered 2014-08-14: 2.5 mg via RESPIRATORY_TRACT
  Filled 2014-08-12: qty 3

## 2014-08-12 MED ORDER — SENNA 8.6 MG PO TABS
2.0000 | ORAL_TABLET | Freq: Every day | ORAL | Status: DC
  Filled 2014-08-12 (×3): qty 2

## 2014-08-12 MED ORDER — HYDROMORPHONE HCL 1 MG/ML IJ SOLN
1.0000 mg | Freq: Once | INTRAMUSCULAR | Status: AC
  Administered 2014-08-12: 1 mg via INTRAVENOUS
  Filled 2014-08-12: qty 1

## 2014-08-12 MED ORDER — LORAZEPAM 2 MG/ML IJ SOLN
1.0000 mg | Freq: Once | INTRAMUSCULAR | Status: DC
  Filled 2014-08-12: qty 1

## 2014-08-12 MED ORDER — PROMETHAZINE HCL 25 MG/ML IJ SOLN
12.5000 mg | Freq: Three times a day (TID) | INTRAMUSCULAR | Status: DC | PRN
  Administered 2014-08-12 – 2014-08-13 (×2): 12.5 mg via INTRAVENOUS
  Filled 2014-08-12 (×2): qty 1

## 2014-08-12 MED ORDER — PIPERACILLIN-TAZOBACTAM 3.375 G IVPB 30 MIN
3.3750 g | Freq: Once | INTRAVENOUS | Status: DC
Start: 2014-08-12 — End: 2014-08-12

## 2014-08-12 MED ORDER — ACETAMINOPHEN 325 MG PO TABS: 325.0000 mg | ORAL_TABLET | Freq: Once | ORAL | Status: DC

## 2014-08-12 MED ORDER — CYCLOBENZAPRINE HCL 10 MG PO TABS
10.0000 mg | ORAL_TABLET | Freq: Three times a day (TID) | ORAL | Status: DC | PRN
  Administered 2014-08-12: 10 mg via ORAL
  Filled 2014-08-12: qty 1

## 2014-08-12 MED ORDER — ACETAMINOPHEN 650 MG RE SUPP: 650.0000 mg | Freq: Four times a day (QID) | RECTAL | Status: DC | PRN

## 2014-08-12 MED ORDER — SODIUM CHLORIDE 0.9 % IJ SOLN
3.0000 mL | Freq: Two times a day (BID) | INTRAMUSCULAR | Status: DC
  Administered 2014-08-12 – 2014-08-20 (×7): 3 mL via INTRAVENOUS

## 2014-08-12 MED ORDER — ENOXAPARIN SODIUM 100 MG/ML ~~LOC~~ SOLN
100.0000 mg | Freq: Every day | SUBCUTANEOUS | Status: DC
  Administered 2014-08-12 – 2014-08-20 (×9): 100 mg via SUBCUTANEOUS
  Filled 2014-08-12 (×9): qty 1

## 2014-08-12 MED ORDER — IOHEXOL 300 MG/ML  SOLN: 100.0000 mL | Freq: Once | INTRAMUSCULAR | Status: AC | PRN

## 2014-08-12 MED ORDER — METHADONE HCL 10 MG PO TABS
180.0000 mg | ORAL_TABLET | Freq: Every day | ORAL | Status: DC
  Administered 2014-08-12 – 2014-08-20 (×9): 180 mg via ORAL
  Filled 2014-08-12 (×9): qty 18

## 2014-08-12 MED ORDER — IPRATROPIUM-ALBUTEROL 0.5-2.5 (3) MG/3ML IN SOLN
3.0000 mL | RESPIRATORY_TRACT | Status: DC
  Administered 2014-08-12: 3 mL via RESPIRATORY_TRACT
  Filled 2014-08-12: qty 3

## 2014-08-12 MED ORDER — IOHEXOL 300 MG/ML  SOLN
25.0000 mL | Freq: Once | INTRAMUSCULAR | Status: AC | PRN
  Administered 2014-08-12: 25 mL via ORAL

## 2014-08-12 MED ORDER — DOCUSATE SODIUM 100 MG PO CAPS
100.0000 mg | ORAL_CAPSULE | Freq: Two times a day (BID) | ORAL | Status: DC
  Administered 2014-08-12 – 2014-08-13 (×3): 100 mg via ORAL
  Filled 2014-08-12 (×3): qty 1

## 2014-08-12 MED ORDER — NICOTINE 21 MG/24HR TD PT24
21.0000 mg | MEDICATED_PATCH | Freq: Every day | TRANSDERMAL | Status: DC
  Administered 2014-08-12 – 2014-08-20 (×9): 21 mg via TRANSDERMAL
  Filled 2014-08-12 (×9): qty 1

## 2014-08-12 NOTE — Progress Notes (Signed)
Pt transferred to MRI. While there his IV was pulled out. He remains very restless. Pt refused to have MRI because of his claustraphobia. IV team to MRI to place IV. RN to MRI to give meds. Pt insisted according to radiology techs that he would not have MRI and he wanted Ct scan. They took him to CT. They refused to do CT because there was no order. They returned him to his room. He is restless. STanding up at bedside. Sitting down. Area of redness on his left leg has increased and there is increased swelling. Area marked with green pen.

## 2014-08-12 NOTE — H&P (Signed)
[facility] History and Physical  Patient: [name]  MRN: [id number]  DOB: [birth year]  DOS: the patient was seen and examined on 08/12/2014 PCP: No primary care provider on file.  Referring physician: Dr. Sharol Given Chief Complaint: Abdominal pain  HPI: [name] is a 54 y.o. male with Past medical history of chronic venous insufficiency, morbid obesity, history of CHF, history of COPD, history of substance abuse currently on methadone. The patient is presenting with complains of abdominal pain ongoing for last 2 days. He denies any diarrhea constipation denies any nausea or vomiting denies any chest pain but does complains of not feeling well. He also complains of cough with greenish expectoration as well as shortness of breath progressively worsening over last 1 week. He has chronic leg swellings. At the time of my evaluation his left leg was red and swollen and he mentions that this is more swollen than his usual and does not appear red leg this. He denies any fall trauma injury. He denies any dizziness or lightheadedness or focal deficit. He mentions he is an active smoker and smokes one pack a day. He is only taking methadone and does not take any other medications at present.  The patient is coming from home. And at his baseline independent for most of his ADL.  Review of Systems: as mentioned in the history of present illness.  A comprehensive review of the other systems is negative.  Past Medical History  Diagnosis Date  . Venous insufficiency of leg   . Morbid obesity   . Venous stasis dermatitis   . CHF (congestive heart failure)    Past Surgical History  Procedure Laterality Date  . Carpal tunnel release      bilateral   . Eye surgery     Social History:  reports that he has been smoking Cigarettes.  He has a 105 pack-year smoking history. He has never used smokeless tobacco. He reports that he does not drink alcohol or use illicit drugs.  No Known  Allergies  Family History  Problem Relation Age of Onset  . Diabetes Father   . Heart disease Father   . Hypertension Father   . Colon cancer Father   . Leukemia Mother     passed away 07-09-08    Prior to Admission medications   Medication Sig Start Date End Date Taking? Authorizing Provider  methadone (DOLOPHINE) 10 MG tablet Take 17 tablets (170 mg total) by mouth daily. Patient taking differently: Take 180 mg by mouth daily.  09/24/12  Yes Emory Carollee Leitz, MD    Physical Exam: Filed Vitals:   08/12/14 0300 08/12/14 0315 08/12/14 0350 08/12/14 0434  BP: 116/57 135/80 133/57   Pulse: 102 109 102   Temp:   101.2 F (38.4 C)   TempSrc:   Oral   Resp: '14 16 20   ' Height:   '6\' 1"'  (1.854 m)   Weight:   196.861 kg (434 lb)   SpO2: 92% 92% 94% 94%    General: Alert, Awake and Oriented to Time, Place and Person. Appear in mild distress Eyes: PERRL ENT: Oral Mucosa clear moist. Neck: no JVD Cardiovascular: S1 and S2 Present, no Murmur, Peripheral Pulses Present Respiratory: Bilateral Air entry equal and Decreased,  Bilateral  Crackles, bilateral expiratory  wheezes Abdomen: Bowel Sound Present, Soft and diffusely  tender Skin: left leg redness  Rash Bilateral chronic venous stasis  Below the pannus redness across the abdomen  Extremities: bilateral  Pedal edema, bilateral calf  tenderness Neurologic: Grossly no focal neuro deficit.  Labs on Admission:  CBC:  Recent Labs Lab 08/11/14 2355  WBC 16.2*  NEUTROABS 15.0*  HGB 13.7  HCT 40.8  MCV 86.4  PLT 194    CMP     Component Value Date/Time   NA 131* 08/11/2014 2355   K 3.5 08/11/2014 2355   CL 97* 08/11/2014 2355   CO2 25 08/11/2014 2355   GLUCOSE 147* 08/11/2014 2355   BUN 9 08/11/2014 2355   CREATININE 0.70 08/11/2014 2355   CALCIUM 8.9 08/11/2014 2355   PROT 7.7 08/11/2014 2355   ALBUMIN 4.0 08/11/2014 2355   AST 36 08/11/2014 2355   ALT 31 08/11/2014 2355   ALKPHOS 56 08/11/2014 2355   BILITOT 0.7  08/11/2014 2355   GFRNONAA >90 08/11/2014 2355   GFRAA >90 08/11/2014 2355     Recent Labs Lab 08/12/14 0030  LIPASE 30    No results for input(s): CKTOTAL, CKMB, CKMBINDEX, TROPONINI in the last 168 hours. BNP (last 3 results) No results for input(s): BNP in the last 8760 hours.  ProBNP (last 3 results) No results for input(s): PROBNP in the last 8760 hours.   Radiological Exams on Admission: Ct Abdomen Pelvis W Contrast  08/12/2014   CLINICAL DATA:  Acute onset of generalized abdominal pain and back pain. Dysuria. Nausea and vomiting. Initial encounter.  EXAM: CT ABDOMEN AND PELVIS WITH CONTRAST  TECHNIQUE: Multidetector CT imaging of the abdomen and pelvis was performed using the standard protocol following bolus administration of intravenous contrast.  CONTRAST:  100 mL of Omnipaque 300 IV contrast  COMPARISON:  CT of the abdomen and pelvis from 07/12/2011  FINDINGS: The visualized lung bases are clear.  There is diffuse fatty infiltration within the liver. The liver and spleen are otherwise unremarkable. The gallbladder is within normal limits. The pancreas and adrenal glands are unremarkable.  A 2.9 cm cyst is noted at the upper pole of the right kidney. Mild nonspecific perinephric stranding is noted bilaterally. A nonobstructing 1.0 cm stone is noted at the lower pole of the left kidney. The kidneys are otherwise unremarkable. There is no evidence of hydronephrosis. No obstructing ureteral stones are identified.  No free fluid is identified. The small bowel is unremarkable in appearance. The stomach is within normal limits. No acute vascular abnormalities are seen. Mild calcification is noted along the abdominal aorta.  The appendix is normal in caliber, without evidence of appendicitis. The colon is unremarkable in appearance.  The bladder is mildly distended and grossly unremarkable in appearance. The prostate is diminutive and grossly unremarkable. No inguinal lymphadenopathy is seen.   No acute osseous abnormalities are identified. Mild degenerative change is noted at the pubic symphysis. Multilevel vacuum phenomenon is noted along the lower thoracic and lumbar spine.  IMPRESSION: 1. No acute abnormality seen to explain the patient's symptoms. 2. Nonobstructing 1.0 cm stone at the lower pole of the left kidney. 3. No evidence of hydronephrosis. Small right renal cyst noted. 4. Diffuse fatty infiltration within the liver.   Electronically Signed   By: Garald Balding M.D.   On: 08/12/2014 01:48   Dg Chest Port 1 View  08/12/2014   CLINICAL DATA:  Acute onset of right-sided abdominal pain. Fever. Dysuria, nausea and vomiting. Initial encounter.  EXAM: PORTABLE CHEST - 1 VIEW  COMPARISON:  Chest radiograph performed 07/09/2013  FINDINGS: The lungs are well-aerated. Vascular congestion is noted. Increased interstitial markings may reflect mild interstitial edema or possibly pneumonia, given  the patient's symptoms. There is no evidence of pleural effusion or pneumothorax.  The cardiomediastinal silhouette is within normal limits. No acute osseous abnormalities are seen.  IMPRESSION: Vascular congestion noted. Increased interstitial markings may reflect mild interstitial edema or possibly pneumonia, given the patient's symptoms.   Electronically Signed   By: Garald Balding M.D.   On: 08/12/2014 04:14   Assessment/Plan Principal Problem:   Sepsis due to cellulitis Active Problems:   Venous stasis dermatitis   Morbid obesity   Chronic narcotic use   COPD exacerbation   1. Sepsis due to cellulitis The patient is presenting with complaints of abdominal pain. He had a CT scan of the abdomen which does not show any acute abnormality. He did have significant fever tachycardia as well as hypertension. He has leukocytosis and lactic acidosis which responded to IV fluids. Chest x-ray shows vascular congestion with interstitial edema with possible pneumonia. Patient has hypoxia which is not  chronic. Patient has left leg swelling with redness and tenderness. He also has a skin infection and pannus. With this appears that the patient has sepsis secondary to cellulitis as well as possible COPD exacerbation. With this the patient will be treated with broadly with vancomycin and Zosyn. Duo nebs. Oxygen as needed. Follow cultures. ESR and CRP. I would check ultrasound of the lower extremity to rule out DVT.  2. COPD exacerbation. Treatment as above. Follow cultures. Currently holding steroids in view of ongoing infection can be added later.  3. Chronic pain. Continuing methadone. For his current abdominal pain with continue with when necessary Dilaudid.  4. possible CHF. Checking BNP. An echocardiogram Patient did have IV fluids in the ER which did did improve his lactic acid from 3-1. Continue close monitoring.  Advance goals of care discussion:  full code   DVT Prophylaxis: subcutaneous Heparin Nutrition: Clear liquid diet secondary to abdominal pain   Family Communication: family was present at bedside, opportunity was given to ask question and all questions were answered satisfactorily at the time of interview. Disposition: Admitted as inpatient, telemetry unit.  Author: Berle Mull, MD [facility] Pager: [phone] 08/12/2014  If 7PM-7AM, please contact night-coverage [email or web address] Password [email or web address]

## 2014-08-12 NOTE — ED Notes (Signed)
IV fluids started in left wrist, 2 boluses started.

## 2014-08-12 NOTE — Progress Notes (Signed)
ANTIBIOTIC CONSULT NOTE - INITIAL  Pharmacy Consult for Vancomycin and Zosyn  Indication: cellulitis  No Known Allergies  Patient Measurements: Height: 6\' 1"  (185.4 cm) Weight: (!) 434 lb (196.861 kg) IBW/kg (Calculated) : 79.9 Adjusted Body Weight: 130 kg  Vital Signs: Temp: 101.2 F (38.4 C) (05/01 0350) Temp Source: Oral (05/01 0350) BP: 133/57 mmHg (05/01 0350) Pulse Rate: 102 (05/01 0350) Intake/Output from previous day: 04/30 0701 - 05/01 0700 In: 2400 [I.V.:2400] Out: 350 [Urine:350] Intake/Output from this shift: Total I/O In: 2400 [I.V.:2400] Out: 350 [Urine:350]  Labs:  Recent Labs  08/11/14 2355  WBC 16.2*  HGB 13.7  PLT 194  CREATININE 0.70   Estimated Creatinine Clearance: 193.6 mL/min (by C-G formula based on Cr of 0.7). No results for input(s): VANCOTROUGH, VANCOPEAK, VANCORANDOM, GENTTROUGH, GENTPEAK, GENTRANDOM, TOBRATROUGH, TOBRAPEAK, TOBRARND, AMIKACINPEAK, AMIKACINTROU, AMIKACIN in the last 72 hours.   Microbiology: No results found for this or any previous visit (from the past 720 hour(s)).  Medical History: Past Medical History  Diagnosis Date  . Venous insufficiency of leg   . Morbid obesity   . Venous stasis dermatitis   . CHF (congestive heart failure)     Medications:  Prescriptions prior to admission  Medication Sig Dispense Refill Last Dose  . methadone (DOLOPHINE) 10 MG tablet Take 17 tablets (170 mg total) by mouth daily. (Patient taking differently: Take 180 mg by mouth daily. ) 17 tablet 0 08/11/2014 at Unknown time  . [DISCONTINUED] doxycycline (VIBRA-TABS) 100 MG tablet Take 1 tablet (100 mg total) by mouth 2 (two) times daily. (Patient not taking: Reported on 08/12/2014) 5 tablet 0 Completed Course at Unknown time  . [DISCONTINUED] furosemide (LASIX) 40 MG tablet Take 1 tablet (40 mg total) by mouth daily. (Patient not taking: Reported on 08/12/2014) 30 tablet 0 Not Taking at Unknown time  . [DISCONTINUED] potassium chloride 20  MEQ TBCR Take 20 mEq by mouth daily. (Patient not taking: Reported on 08/12/2014) 30 tablet 0 Not Taking at Unknown time  . [DISCONTINUED] sulfamethoxazole-trimethoprim (SEPTRA DS) 800-160 MG per tablet Take 1 tablet by mouth 2 (two) times daily. (Patient not taking: Reported on 08/12/2014) 24 tablet 0 Completed Course at Unknown time   Assessment: 54 y.o. male with with fever, LE venous stasis, possible cellulitis, for empiric antibiotics  Goal of Therapy:  Vancomycin trough 10-15  Plan:  Vancomycin 2000 mg IV now, then 1500 mg IV q8h Zosyn 3.375 g IV q8h   Caryl Pina 08/12/2014,4:46 AM

## 2014-08-12 NOTE — ED Notes (Signed)
Patient is constantly trying to get up and walk around the room after he has been instructed many times to stay in the bed.  Monitoring equipment and IV dressings come off each time patient tries to get up.  Patient has been informed that he is a high fall risk especially because of medications he received, as well as the patient care devices he is attached to.  Patient states that he will stay in the bed this time.

## 2014-08-12 NOTE — Progress Notes (Signed)
PROGRESS NOTE  [name] IZT:[id number] DOB: 1960/11/11 DOA: 08/11/2014 PCP: No primary care provider on file.  Brief history 54 year old male with a history of COPD, CHF, morbid obesity, venous stasis of the lower extremities presented with 2 day history of increasing pain, edema, and erythema of his left lower extremity. The patient states that he hit his left leg knocking off a scab which she thinks may have been the cause of his cellulitis. At the time of admission, WBC was 16.2 with temperature 102.24F and lactic acid of 3.30. Venous duplex of his lower extremities was negative for DVT. In addition, the patient was also complaining of shortness of breath for the past 2 days with nonproductive cough. He had some nausea without vomiting. He also has some epigastric abdominal discomfort and left upper quadrant abdominal discomfort. He does have some constipation and has not had a bowel movement and nearly one week. Unfortunately, he continues to smoke 1 pack per day. He has over 100-pack-year history. Assessment/Plan: Sepsis -Secondary to cellulitis of lower extremities -WBC has increased although lab work was only obtained 2 hours after his initial dose of antibiotics -The patient continues to have fever up to 103.78F -Continue intravenous vancomycin and Zosyn pending culture data -Influenza PCR negative -Urinalysis negative for pyuria -Chest x-ray shows increased interstitial markings Cellulitis of the left lower extremity  -MRI lower extremity  -The patient continues to have fever despite antibiotics  -If MRI could not be obtained due to the patient's body habitus, will need to try CT  -Check CPK, ESR  -Procalcitonin 6.12  Acute respiratory failure/hypoxemia  -Suspect he had 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  -08/12/2014 --breathing much better after DuoNeb's--oxygen saturation 98-100 percent on room air when I checked personally -No wheezing on exam    -Although chest x-ray suggests interstitial edema--it has not significantly changed when compared to chest x-ray on 07/09/2013 -Furthermore, the patient has been on fluids at 100 mL per hour for now 24 hours, and his oxygen saturation is 98-100 percent on room air, and the patient is clinically breathing better -await echo Abdominal pain  -May be related to the patient's constipation  -He is passing flatus  -Start cathartics  -08/12/2014 CT abdomen and pelvis negative for acute findings  Tobacco abuse/COPD  -NicoDerm patch  -Tobacco cessation discussed  -Continue aerosolized albuterol and Atrovent  Hyponatremia  -Likely secondary to volume depletion  -Continue IV fluids History of polysubstance abuse -Continue home dose methadone   Family Communication:   Father updated at beside--total time 60 minutes, >50% spent counseling and coordinating care (445pm-545pm) Disposition Plan:   Home when medically stable       Procedures/Studies: Ct Abdomen Pelvis W Contrast  08/12/2014   CLINICAL DATA:  Acute onset of generalized abdominal pain and back pain. Dysuria. Nausea and vomiting. Initial encounter.  EXAM: CT ABDOMEN AND PELVIS WITH CONTRAST  TECHNIQUE: Multidetector CT imaging of the abdomen and pelvis was performed using the standard protocol following bolus administration of intravenous contrast.  CONTRAST:  100 mL of Omnipaque 300 IV contrast  COMPARISON:  CT of the abdomen and pelvis from 07/12/2011  FINDINGS: The visualized lung bases are clear.  There is diffuse fatty infiltration within the liver. The liver and spleen are otherwise unremarkable. The gallbladder is within normal limits. The pancreas and adrenal glands are unremarkable.  A 2.9 cm cyst is noted at the upper pole of the right kidney. Mild nonspecific perinephric stranding  is noted bilaterally. A nonobstructing 1.0 cm stone is noted at the lower pole of the left kidney. The kidneys are otherwise unremarkable. There is no  evidence of hydronephrosis. No obstructing ureteral stones are identified.  No free fluid is identified. The small bowel is unremarkable in appearance. The stomach is within normal limits. No acute vascular abnormalities are seen. Mild calcification is noted along the abdominal aorta.  The appendix is normal in caliber, without evidence of appendicitis. The colon is unremarkable in appearance.  The bladder is mildly distended and grossly unremarkable in appearance. The prostate is diminutive and grossly unremarkable. No inguinal lymphadenopathy is seen.  No acute osseous abnormalities are identified. Mild degenerative change is noted at the pubic symphysis. Multilevel vacuum phenomenon is noted along the lower thoracic and lumbar spine.  IMPRESSION: 1. No acute abnormality seen to explain the patient's symptoms. 2. Nonobstructing 1.0 cm stone at the lower pole of the left kidney. 3. No evidence of hydronephrosis. Small right renal cyst noted. 4. Diffuse fatty infiltration within the liver.   Electronically Signed   By: Garald Balding M.D.   On: 08/12/2014 01:48   Dg Chest Port 1 View  08/12/2014   CLINICAL DATA:  Acute onset of right-sided abdominal pain. Fever. Dysuria, nausea and vomiting. Initial encounter.  EXAM: PORTABLE CHEST - 1 VIEW  COMPARISON:  Chest radiograph performed 07/09/2013  FINDINGS: The lungs are well-aerated. Vascular congestion is noted. Increased interstitial markings may reflect mild interstitial edema or possibly pneumonia, given the patient's symptoms. There is no evidence of pleural effusion or pneumothorax.  The cardiomediastinal silhouette is within normal limits. No acute osseous abnormalities are seen.  IMPRESSION: Vascular congestion noted. Increased interstitial markings may reflect mild interstitial edema or possibly pneumonia, given the patient's symptoms.   Electronically Signed   By: Garald Balding M.D.   On: 08/12/2014 04:14         Subjective: Patient continues to  complain of left lower extremity pain. States that he is breathing better. Denies any vomiting, diarrhea, dysuria, hematuria. Complains of epigastric abdominal discomfort with nausea. No emesis.   Objective: Filed Vitals:   08/12/14 0434 08/12/14 6606 08/12/14 0839 08/12/14 1531  BP:  94/37  116/84  Pulse:  80  93  Temp:  98.8 F (37.1 C)  100.9 F (38.3 C)  TempSrc:  Oral  Oral  Resp:  18  20  Height:      Weight:      SpO2: 94% 96% 91% 98%    Intake/Output Summary (Last 24 hours) at 08/12/14 1724 Last data filed at 08/12/14 0903  Gross per 24 hour  Intake   3390 ml  Output    650 ml  Net   2740 ml   Weight change:  Exam:   General:  Pt is alert, follows commands appropriately, not in acute distress  HEENT: No icterus, No thrush,[place]/AT  Cardiovascular: RRR, S1/S2, no rubs, no gallops  Respiratory: CTA bilaterally, no wheezing, no crackles, no rhonchi  Abdomen: Soft/+BS, non tender, non distended, no guarding Extremities: 2+LLE edema with erythema from the infrapatellar area to the ankle. There is no crepitance. There is warmth and erythema without any necrosis.  Data Reviewed: Basic Metabolic Panel:  Recent Labs Lab 08/11/14 2355 08/12/14 0705  NA 131* 133*  K 3.5 3.7  CL 97* 100*  CO2 25 23  GLUCOSE 147* 108*  BUN 9 10  CREATININE 0.70 0.83  CALCIUM 8.9 8.4*   Liver Function Tests:  Recent  Labs Lab 08/11/14 2355 08/12/14 0705  AST 36 31  ALT 31 25  ALKPHOS 56 51  BILITOT 0.7 0.7  PROT 7.7 7.0  ALBUMIN 4.0 3.5    Recent Labs Lab 08/12/14 0030  LIPASE 30   No results for input(s): AMMONIA in the last 168 hours. CBC:  Recent Labs Lab 08/11/14 2355 08/12/14 0705  WBC 16.2* 20.0*  NEUTROABS 15.0* 19.3*  HGB 13.7 12.3*  HCT 40.8 35.9*  MCV 86.4 84.9  PLT 194 174   Cardiac Enzymes: No results for input(s): CKTOTAL, CKMB, CKMBINDEX, TROPONINI in the last 168 hours. BNP: Invalid input(s): POCBNP CBG: No results for input(s): GLUCAP  in the last 168 hours.  No results found for this or any previous visit (from the past 240 hour(s)).   Scheduled Meds: . docusate sodium  100 mg Oral BID  . enoxaparin (LOVENOX) injection  100 mg Subcutaneous Daily  . guaiFENesin  600 mg Oral BID  . ipratropium-albuterol  3 mL Nebulization Q6H  . methadone  180 mg Oral Q breakfast  . nicotine  21 mg Transdermal Daily  . piperacillin-tazobactam (ZOSYN)  IV  3.375 g Intravenous 3 times per day  . [START ON 08/13/2014] pneumococcal 23 valent vaccine  0.5 mL Intramuscular Tomorrow-1000  . senna  2 tablet Oral Daily  . sodium chloride  3 mL Intravenous Q12H  . vancomycin  1,500 mg Intravenous Q8H   Continuous Infusions: . sodium chloride 125 mL/hr at 08/12/14 0600     Corin Tilly, DO  [facility] Pager [phone]  If 7PM-7AM, please contact night-coverage [email or web address] Password [email or web address] 08/12/2014, 5:24 PM   LOS: 0 days

## 2014-08-12 NOTE — Progress Notes (Signed)
VASCULAR LAB PRELIMINARY  PRELIMINARY  PRELIMINARY  PRELIMINARY  Bilateral lower extremity venous duplex  completed.    Preliminary report:  Bilateral:  No evidence of DVT, superficial thrombosis, or Baker's Cyst.    Oda Cogan, RVT 08/12/2014, 2:08 PM

## 2014-08-12 NOTE — ED Notes (Signed)
IV fluids stopped at 2 liters per Dr. Sharol Given.  Pt not to receive 30 cc/kg bolus.

## 2014-08-13 ENCOUNTER — Inpatient Hospital Stay (HOSPITAL_COMMUNITY): Payer: Medicaid Other

## 2014-08-13 ENCOUNTER — Encounter (HOSPITAL_COMMUNITY): Payer: Self-pay

## 2014-08-13 DIAGNOSIS — R0600 Dyspnea, unspecified: Secondary | ICD-10-CM

## 2014-08-13 DIAGNOSIS — R7881 Bacteremia: Secondary | ICD-10-CM | POA: Diagnosis present

## 2014-08-13 LAB — CBC
HCT: 35.9 % — ABNORMAL LOW (ref 39.0–52.0)
HEMOGLOBIN: 12.3 g/dL — AB (ref 13.0–17.0)
MCH: 28.9 pg (ref 26.0–34.0)
MCHC: 34.3 g/dL (ref 30.0–36.0)
MCV: 84.5 fL (ref 78.0–100.0)
Platelets: 123 10*3/uL — ABNORMAL LOW (ref 150–400)
RBC: 4.25 MIL/uL (ref 4.22–5.81)
RDW: 14.2 % (ref 11.5–15.5)
WBC: 13.6 10*3/uL — ABNORMAL HIGH (ref 4.0–10.5)

## 2014-08-13 LAB — BASIC METABOLIC PANEL
ANION GAP: 13 (ref 5–15)
BUN: 10 mg/dL (ref 6–20)
CO2: 22 mmol/L (ref 22–32)
Calcium: 7.7 mg/dL — ABNORMAL LOW (ref 8.9–10.3)
Chloride: 96 mmol/L — ABNORMAL LOW (ref 101–111)
Creatinine, Ser: 0.83 mg/dL (ref 0.61–1.24)
GFR calc non Af Amer: >60 mL/min (ref >60)
Glucose, Bld: 77 mg/dL (ref 70–99)
Potassium: 3.5 mmol/L (ref 3.5–5.1)
SODIUM: 131 mmol/L — AB (ref 135–145)

## 2014-08-13 LAB — URINE CULTURE
CULTURE: NO GROWTH
Colony Count: NO GROWTH

## 2014-08-13 LAB — C-REACTIVE PROTEIN: CRP: 23.2 mg/dL — AB (ref <1.0)

## 2014-08-13 LAB — CK: Total CK: 349 U/L (ref 49–397)

## 2014-08-13 LAB — SEDIMENTATION RATE: Sed Rate: 60 mm/hr — ABNORMAL HIGH (ref 0–16)

## 2014-08-13 LAB — VANCOMYCIN, TROUGH: VANCOMYCIN TR: 11 ug/mL (ref 10.0–20.0)

## 2014-08-13 MED ORDER — IOHEXOL 300 MG/ML  SOLN
100.0000 mL | Freq: Once | INTRAMUSCULAR | Status: AC | PRN
  Administered 2014-08-13: 100 mL via INTRAVENOUS

## 2014-08-13 MED ORDER — PERFLUTREN LIPID MICROSPHERE
1.0000 mL | INTRAVENOUS | Status: AC | PRN
  Administered 2014-08-13: 3 mL via INTRAVENOUS
  Filled 2014-08-13: qty 10

## 2014-08-13 MED ORDER — VANCOMYCIN HCL 10 G IV SOLR
2000.0000 mg | Freq: Three times a day (TID) | INTRAVENOUS | Status: DC
  Filled 2014-08-13 (×2): qty 2000

## 2014-08-13 MED ORDER — VANCOMYCIN HCL 10 G IV SOLR
2000.0000 mg | Freq: Once | INTRAVENOUS | Status: DC
  Filled 2014-08-13: qty 2000

## 2014-08-13 MED ORDER — VANCOMYCIN HCL 10 G IV SOLR
2000.0000 mg | Freq: Three times a day (TID) | INTRAVENOUS | Status: DC
  Administered 2014-08-14 – 2014-08-15 (×5): 2000 mg via INTRAVENOUS
  Filled 2014-08-13 (×6): qty 2000

## 2014-08-13 NOTE — Progress Notes (Signed)
PROGRESS NOTE  [name] KGY:[id number] DOB: [birth year] DOA: 08/11/2014 PCP: No primary care provider on file.   Brief history 54 year old male with a history of COPD, CHF, morbid obesity, venous stasis of the lower extremities presented with 2 day history of increasing pain, edema, and erythema of his left lower extremity. The patient states that he hit his left leg knocking off a scab which she thinks may have been the cause of his cellulitis. At the time of admission, WBC was 16.2 with temperature 102.19F and lactic acid of 3.30. Venous duplex of his lower extremities was negative for DVT. In addition, the patient was also complaining of shortness of breath for the past 2 days with nonproductive cough. He had some nausea without vomiting. He also has some epigastric abdominal discomfort and left upper quadrant abdominal discomfort. He does have some constipation and has not had a bowel movement and nearly one week. Unfortunately, he continues to smoke 1 pack per day. He has over 100-pack-year history. Assessment/Plan: Sepsis -Secondary to cellulitis of lower extremities and bacteremia -WBC has increased although lab work was only obtained 2 hours after his initial dose of antibiotics -The patient continues to have fever although WBC is trending down -Continue intravenous vancomycin pending culture data -Influenza PCR negative -Urinalysis negative for pyuria -Initial Chest x-ray neg for consolidation Cellulitis of the left lower extremity  -MRI lower extremity--pt could not tolerate--not willing to re-try even with anxiolysis meds -order CT lower extremities -Check CPK--349 -ESR--60  -CRP 23.2 -Procalcitonin 6.12  Bacteremia -source= cellulitis -surveillance blood culture -suspect GAS vs enterococcus -DC Zosyn, continue vancomycin pending final culture data  Acute respiratory failure/hypoxemia  -Suspect he had 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   -now concerned about pulmonary edema with IVF -08/12/2014 --breathing much better after DuoNeb's--oxygen saturation 94-95% RA percent on room air when I checked personally -No wheezing on exam  -08/13/14--repeat CXR -await echo results Abdominal pain  -May be related to the patient's constipation  -He is passing flatus  -Start cathartics-->BM  -08/12/2014 CT abdomen and pelvis negative for acute findings  Tobacco abuse/COPD  -NicoDerm patch  -Tobacco cessation discussed  -Continue aerosolized albuterol and Atrovent  Hyponatremia  -Likely secondary to volume overload -Saline lock IV fluids History of polysubstance abuse -Continue home dose methadone   Family Communication: Father updated at beside Disposition Plan: Home when medically stable    Procedures/Studies: Ct Abdomen Pelvis W Contrast  08/12/2014   CLINICAL DATA:  Acute onset of generalized abdominal pain and back pain. Dysuria. Nausea and vomiting. Initial encounter.  EXAM: CT ABDOMEN AND PELVIS WITH CONTRAST  TECHNIQUE: Multidetector CT imaging of the abdomen and pelvis was performed using the standard protocol following bolus administration of intravenous contrast.  CONTRAST:  100 mL of Omnipaque 300 IV contrast  COMPARISON:  CT of the abdomen and pelvis from 07/12/2011  FINDINGS: The visualized lung bases are clear.  There is diffuse fatty infiltration within the liver. The liver and spleen are otherwise unremarkable. The gallbladder is within normal limits. The pancreas and adrenal glands are unremarkable.  A 2.9 cm cyst is noted at the upper pole of the right kidney. Mild nonspecific perinephric stranding is noted bilaterally. A nonobstructing 1.0 cm stone is noted at the lower pole of the left kidney. The kidneys are otherwise unremarkable. There is no evidence of hydronephrosis. No obstructing ureteral stones are identified.  No free fluid is identified. The small bowel is unremarkable in  appearance. The  stomach is within normal limits. No acute vascular abnormalities are seen. Mild calcification is noted along the abdominal aorta.  The appendix is normal in caliber, without evidence of appendicitis. The colon is unremarkable in appearance.  The bladder is mildly distended and grossly unremarkable in appearance. The prostate is diminutive and grossly unremarkable. No inguinal lymphadenopathy is seen.  No acute osseous abnormalities are identified. Mild degenerative change is noted at the pubic symphysis. Multilevel vacuum phenomenon is noted along the lower thoracic and lumbar spine.  IMPRESSION: 1. No acute abnormality seen to explain the patient's symptoms. 2. Nonobstructing 1.0 cm stone at the lower pole of the left kidney. 3. No evidence of hydronephrosis. Small right renal cyst noted. 4. Diffuse fatty infiltration within the liver.   Electronically Signed   By: Garald Balding M.D.   On: 08/12/2014 01:48   Dg Chest Port 1 View  08/12/2014   CLINICAL DATA:  Acute onset of right-sided abdominal pain. Fever. Dysuria, nausea and vomiting. Initial encounter.  EXAM: PORTABLE CHEST - 1 VIEW  COMPARISON:  Chest radiograph performed 07/09/2013  FINDINGS: The lungs are well-aerated. Vascular congestion is noted. Increased interstitial markings may reflect mild interstitial edema or possibly pneumonia, given the patient's symptoms. There is no evidence of pleural effusion or pneumothorax.  The cardiomediastinal silhouette is within normal limits. No acute osseous abnormalities are seen.  IMPRESSION: Vascular congestion noted. Increased interstitial markings may reflect mild interstitial edema or possibly pneumonia, given the patient's symptoms.   Electronically Signed   By: Garald Balding M.D.   On: 08/12/2014 04:14         Subjective: Patient complains of some shortness of breath today. Denies any chest pain, nausea, vomiting, diarrhea, dysuria, hematuria. He had a bowel movement yesterday. Denies any headaches  or visual disturbance. Had some fevers and chills.  Objective: Filed Vitals:   08/13/14 0453 08/13/14 0838 08/13/14 1300 08/13/14 1306  BP: 118/44  127/69   Pulse: 86  106   Temp: 98.9 F (37.2 C)  101.7 F (38.7 C)   TempSrc: Oral     Resp: 20  20   Height:      Weight: 197.768 kg (436 lb)     SpO2: 96% 90% 99% 98%    Intake/Output Summary (Last 24 hours) at 08/13/14 1751 Last data filed at 08/13/14 1700  Gross per 24 hour  Intake   1780 ml  Output    900 ml  Net    880 ml   Weight change: 0.907 kg (2 lb) Exam:   General:  Pt is alert, follows commands appropriately, not in acute distress  HEENT: No icterus, No thrush, [place]/AT  Cardiovascular: RRR, S1/S2, no rubs, no gallops  Respiratory: Bibasilar crackles. No wheeze.  Abdomen: Soft/+BS, non tender, non distended, no guarding Extremities: 3+ edema LLE with erythema from the suprapatellar area extending into the dorsum of the left foot. There is no crepitance. There is no necrosis. No draining wounds. Data Reviewed: Basic Metabolic Panel:  Recent Labs Lab 08/11/14 2355 08/12/14 0705 08/13/14 0551  NA 131* 133* 131*  K 3.5 3.7 3.5  CL 97* 100* 96*  CO2 _0 GLUCOSE 147* 108* 77  BUN _1 CREATININE 0.70 0.83 0.83  CALCIUM 8.9 8.4* 7.7*   Liver Function Tests:  Recent Labs Lab 08/11/14 2355 08/12/14 0705  AST 36 31  ALT 31 25  ALKPHOS 56 51  BILITOT 0.7 0.7  PROT 7.7 7.0  ALBUMIN 4.0 3.5    Recent Labs Lab 08/12/14 0030  LIPASE 30   No results for input(s): AMMONIA in the last 168 hours. CBC:  Recent Labs Lab 08/11/14 2355 08/12/14 0705 08/13/14 0551  WBC 16.2* 20.0* 13.6*  NEUTROABS 15.0* 19.3*  --   HGB 13.7 12.3* 12.3*  HCT 40.8 35.9* 35.9*  MCV 86.4 84.9 84.5  PLT 194 174 123*   Cardiac Enzymes:  Recent Labs Lab 08/13/14 0551  CKTOTAL 349   BNP: Invalid input(s): POCBNP CBG: No results for input(s): GLUCAP in the last 168 hours.  Recent Results (from the  past 240 hour(s))  Urine culture     Status: None   Collection Time: 08/11/14 11:39 PM  Result Value Ref Range Status   Specimen Description URINE, CLEAN CATCH  Final   Special Requests NONE  Final   Colony Count NO GROWTH Performed at [facility]   Final   Culture NO GROWTH Performed at [facility]   Final   Report Status 08/13/2014 FINAL  Final  Culture, blood (routine x 2)     Status: None (Preliminary result)   Collection Time: 08/12/14 12:25 AM  Result Value Ref Range Status   Specimen Description BLOOD RIGHT ARM  Final   Special Requests BOTTLES DRAWN AEROBIC AND ANAEROBIC 4CC EACH  Final   Culture   Final    GRAM POSITIVE COCCI IN CHAINS Note: Gram Stain Report Called to,Read Back By and Verified With: RUTH LEIGHT RN [phone] Performed at [facility]    Report Status PENDING  Incomplete  Culture, blood (routine x 2)     Status: None (Preliminary result)   Collection Time: 08/12/14 12:29 AM  Result Value Ref Range Status   Specimen Description BLOOD RIGHT HAND  Final   Special Requests BOTTLES DRAWN AEROBIC AND ANAEROBIC 5CC EACH  Final   Culture   Final    GRAM POSITIVE COCCI IN CHAINS Note: Gram Stain Report Called to,Read Back By and Verified With: RUTH LEIGH RN [id number] Performed at [facility]    Report Status PENDING  Incomplete     Scheduled Meds: . enoxaparin (LOVENOX) injection  100 mg Subcutaneous Daily  . guaiFENesin  600 mg Oral BID  . ipratropium-albuterol  3 mL Nebulization Q6H  . methadone  180 mg Oral Q breakfast  . nicotine  21 mg Transdermal Daily  . piperacillin-tazobactam (ZOSYN)  IV  3.375 g Intravenous 3 times per day  . pneumococcal 23 valent vaccine  0.5 mL Intramuscular Tomorrow-1000  . senna  2 tablet Oral Daily  . sodium chloride  3 mL Intravenous Q12H  . vancomycin  2,000 mg Intravenous Once  . [START ON 08/14/2014] vancomycin  2,000 mg Intravenous Q8H   Continuous Infusions:    Nevah Dalal, DO  [facility] Pager [phone]  If 7PM-7AM, please contact night-coverage [email or web address] Password [email or web address] 08/13/2014, 5:51 PM   LOS: 1 day

## 2014-08-13 NOTE — Progress Notes (Signed)
ANTIBIOTIC CONSULT NOTE - FOLLOW UP  Pharmacy Consult for Vancomycin and Zosyn  Indication: cellulitis  No Known Allergies  Patient Measurements: Height: 6\' 1"  (185.4 cm) Weight: (!) 436 lb (197.768 kg) IBW/kg (Calculated) : 79.9 Adjusted Body Weight: 130 kg  Vital Signs: Temp: 101.7 F (38.7 C) (05/02 1300) BP: 127/69 mmHg (05/02 1300) Pulse Rate: 106 (05/02 1300) Intake/Output from previous day: 05/01 0701 - 05/02 0700 In: 1600 [P.O.:1300; I.V.:300] Out: 900 [Urine:900] Intake/Output from this shift: Total I/O In: 480 [P.O.:480] Out: -   Labs:  Recent Labs  08/11/14 2355 08/12/14 0705 08/13/14 0551  WBC 16.2* 20.0* 13.6*  HGB 13.7 12.3* 12.3*  PLT 194 174 123*  CREATININE 0.70 0.83 0.83   Estimated Creatinine Clearance: 187.2 mL/min (by C-G formula based on Cr of 0.83).  Recent Labs  08/13/14 1320  [place] 11     Microbiology: Recent Results (from the past 720 hour(s))  Urine culture     Status: None   Collection Time: 08/11/14 11:39 PM  Result Value Ref Range Status   Specimen Description URINE, CLEAN CATCH  Final   Special Requests NONE  Final   Colony Count NO GROWTH Performed at [facility]   Final   Culture NO GROWTH Performed at [facility]   Final   Report Status 08/13/2014 FINAL  Final  Culture, blood (routine x 2)     Status: None (Preliminary result)   Collection Time: 08/12/14 12:25 AM  Result Value Ref Range Status   Specimen Description BLOOD RIGHT ARM  Final   Special Requests BOTTLES DRAWN AEROBIC AND ANAEROBIC 4CC EACH  Final   Culture   Final    GRAM POSITIVE COCCI IN CHAINS Note: Gram Stain Report Called to,Read Back By and Verified With: RUTH LEIGHT RN [phone] Performed at [facility]    Report Status PENDING  Incomplete  Culture, blood (routine x 2)     Status: None (Preliminary result)   Collection Time: 08/12/14 12:29 AM  Result Value Ref Range Status   Specimen Description BLOOD RIGHT HAND   Final   Special Requests BOTTLES DRAWN AEROBIC AND ANAEROBIC 5CC EACH  Final   Culture   Final    GRAM POSITIVE COCCI IN CHAINS Note: Gram Stain Report Called to,Read Back By and Verified With: RUTH LEIGH RN [phone] Performed at [facility]    Report Status PENDING  Incomplete    Medical History: Past Medical History  Diagnosis Date  . Venous insufficiency of leg   . Morbid obesity   . Venous stasis dermatitis   . CHF (congestive heart failure)     Medications:  Prescriptions prior to admission  Medication Sig Dispense Refill Last Dose  . methadone (DOLOPHINE) 10 MG tablet Take 17 tablets (170 mg total) by mouth daily. (Patient taking differently: Take 180 mg by mouth daily. ) 17 tablet 0 08/11/2014 at Unknown time  . [DISCONTINUED] doxycycline (VIBRA-TABS) 100 MG tablet Take 1 tablet (100 mg total) by mouth 2 (two) times daily. (Patient not taking: Reported on 08/12/2014) 5 tablet 0 Completed Course at Unknown time  . [DISCONTINUED] furosemide (LASIX) 40 MG tablet Take 1 tablet (40 mg total) by mouth daily. (Patient not taking: Reported on 08/12/2014) 30 tablet 0 Not Taking at Unknown time  . [DISCONTINUED] potassium chloride 20 MEQ TBCR Take 20 mEq by mouth daily. (Patient not taking: Reported on 08/12/2014) 30 tablet 0 Not Taking at Unknown time  . [DISCONTINUED] sulfamethoxazole-trimethoprim (SEPTRA DS) 800-160  MG per tablet Take 1 tablet by mouth 2 (two) times daily. (Patient not taking: Reported on 08/12/2014) 24 tablet 0 Completed Course at Unknown time   Assessment: 54 y.o. male with fever, LE venous stasis, possible cellulitis, for empiric antibiotics.  Initial VT is SUBtherapeutic at 11 on vancomycin 1500mg  IV q8h. Pt remains febrile to 101.7, sCr 0.8.  Goal of Therapy:  Vancomycin trough 10-15  Plan:  Vancomycin 2000mg  IV q8h Zosyn 3.375 g IV q8h   Andrey Cota. Diona Foley, PharmD Clinical Pharmacist Pager [phone] 08/13/2014,5:04 PM

## 2014-08-13 NOTE — Progress Notes (Signed)
Lab report that pt has anaerobic gram positive cocci in chains in both blood cultures.

## 2014-08-13 NOTE — Progress Notes (Signed)
Utilization review completed. Adalena Abdulla, RN, BSN. 

## 2014-08-13 NOTE — Progress Notes (Addendum)
  Echocardiogram 2D Echocardiogram with Definity has been performed.  [name] FRANCES 08/13/2014, 12:44 PM

## 2014-08-14 ENCOUNTER — Other Ambulatory Visit (HOSPITAL_COMMUNITY): Payer: Self-pay

## 2014-08-14 LAB — CBC
HCT: 32.1 % — ABNORMAL LOW (ref 39.0–52.0)
HEMOGLOBIN: 10.9 g/dL — AB (ref 13.0–17.0)
MCH: 28.8 pg (ref 26.0–34.0)
MCHC: 34 g/dL (ref 30.0–36.0)
MCV: 84.7 fL (ref 78.0–100.0)
Platelets: 118 10*3/uL — ABNORMAL LOW (ref 150–400)
RBC: 3.79 MIL/uL — AB (ref 4.22–5.81)
RDW: 14.1 % (ref 11.5–15.5)
WBC: 7.7 10*3/uL (ref 4.0–10.5)

## 2014-08-14 LAB — BASIC METABOLIC PANEL
ANION GAP: 9 (ref 5–15)
BUN: 6 mg/dL (ref 6–20)
CALCIUM: 7.8 mg/dL — AB (ref 8.9–10.3)
CO2: 23 mmol/L (ref 22–32)
Chloride: 102 mmol/L (ref 101–111)
Creatinine, Ser: 0.76 mg/dL (ref 0.61–1.24)
GFR calc Af Amer: >60 mL/min (ref >60)
GFR calc non Af Amer: >60 mL/min (ref >60)
Glucose, Bld: 82 mg/dL (ref 70–99)
Potassium: 2.8 mmol/L — ABNORMAL LOW (ref 3.5–5.1)
Sodium: 134 mmol/L — ABNORMAL LOW (ref 135–145)

## 2014-08-14 MED ORDER — POTASSIUM CHLORIDE CRYS ER 20 MEQ PO TBCR
40.0000 meq | EXTENDED_RELEASE_TABLET | Freq: Two times a day (BID) | ORAL | Status: AC
  Administered 2014-08-14 (×2): 40 meq via ORAL
  Filled 2014-08-14 (×2): qty 2

## 2014-08-14 MED ORDER — POTASSIUM CHLORIDE 10 MEQ/100ML IV SOLN
10.0000 meq | INTRAVENOUS | Status: AC
  Administered 2014-08-14 (×3): 10 meq via INTRAVENOUS
  Filled 2014-08-14 (×3): qty 100

## 2014-08-14 NOTE — Progress Notes (Signed)
PROGRESS NOTE  [name] GGE:[id number] DOB: [birth year] DOA: 08/11/2014 PCP: No primary care provider on file.   Brief history 54 year old male with a history of COPD, CHF, morbid obesity, venous stasis of the lower extremities presented with 2 day history of increasing pain, edema, and erythema of his left lower extremity. The patient states that he hit his left leg knocking off a scab which she thinks may have been the cause of his cellulitis. At the time of admission, WBC was 16.2 with temperature 102.31F and lactic acid of 3.30. Venous duplex of his lower extremities was negative for DVT. In addition, the patient was also complaining of shortness of breath for the past 2 days with nonproductive cough. He had some nausea without vomiting. He also has some epigastric abdominal discomfort and left upper quadrant abdominal discomfort. He does have some constipation and has not had a bowel movement and nearly one week. Unfortunately, he continues to smoke 1 pack per day. He has over 100-pack-year history.  Assessment/Plan:  Sepsis -Secondary to cellulitis of lower extremities and bacteremia -spike fever 5-02. WBC trending down.  -Continue intravenous vancomycin pending culture data -Influenza PCR negative -Urinalysis negative for pyuria -Initial Chest x-ray neg for consolidation  Cellulitis of the left lower extremity  -MRI lower extremity--pt could not tolerate--not willing to re-try even with anxiolysis meds -CT lower extremities negative for osteomyelitis, abscess. Evidence of cellulitis.  -CPK--349 -ESR--60  -CRP 23.2 -Procalcitonin 6.12  -Per patient redness and swelling improving.  -Continue with vancomycin day 2.  -will ask wound care evaluation for local care.   Bacteremia -source= cellulitis -surveillance blood culture -strep group B.  -continue vancomycin pending final culture data   Thrombocytopenia;  Suspect related to infection. Follow trend.    Hypokalemia;  Replete IV and oral.   Acute respiratory failure/hypoxemia  -Suspect he had 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  -now concerned about pulmonary edema with IVF -08/12/2014 --breathing much better after DuoNeb's--No wheezing on exam  -08/13/14--repeat CXR central vascular congestion.  -await echo results -Patient breathing better. Resume lasix when BP allows it.   Abdominal pain  -May be related to the patient's constipation  -He is passing flatus  -Start cathartics-->BM  -08/12/2014 CT abdomen and pelvis negative for acute findings  -denies abdominal pain, wants regular food. Will advance diet today.   Tobacco abuse/COPD  -NicoDerm patch  -Tobacco cessation discussed  -Continue aerosolized albuterol and Atrovent   Hyponatremia  -Likely secondary to volume overload -Saline lock IV fluids  History of polysubstance abuse -Continue home dose methadone   Family Communication: Father updated at beside Disposition Plan: Home when medically stable    Procedures/Studies: Dg Chest 2 View  08/14/2014   CLINICAL DATA:  Bilateral leg swelling and short of breath  EXAM: CHEST  2 VIEW  COMPARISON:  Radiograph 08/12/2014  FINDINGS: Stable enlarged cardiac silhouette. There is increased central venous congestion and mild interstitial edema. No pleural fluid. No focal consolidation. No pneumothorax.  IMPRESSION: Increased central venous congestion and interstitial edema.   Electronically Signed   By: Suzy Bouchard M.D.   On: 08/14/2014 01:57   Ct Abdomen Pelvis W Contrast  08/12/2014   CLINICAL DATA:  Acute onset of generalized abdominal pain and back pain. Dysuria. Nausea and vomiting. Initial encounter.  EXAM: CT ABDOMEN AND PELVIS WITH CONTRAST  TECHNIQUE: Multidetector CT imaging of the abdomen and pelvis was performed using the standard protocol following bolus administration of intravenous  contrast.  CONTRAST:  100 mL of Omnipaque 300 IV  contrast  COMPARISON:  CT of the abdomen and pelvis from 07/12/2011  FINDINGS: The visualized lung bases are clear.  There is diffuse fatty infiltration within the liver. The liver and spleen are otherwise unremarkable. The gallbladder is within normal limits. The pancreas and adrenal glands are unremarkable.  A 2.9 cm cyst is noted at the upper pole of the right kidney. Mild nonspecific perinephric stranding is noted bilaterally. A nonobstructing 1.0 cm stone is noted at the lower pole of the left kidney. The kidneys are otherwise unremarkable. There is no evidence of hydronephrosis. No obstructing ureteral stones are identified.  No free fluid is identified. The small bowel is unremarkable in appearance. The stomach is within normal limits. No acute vascular abnormalities are seen. Mild calcification is noted along the abdominal aorta.  The appendix is normal in caliber, without evidence of appendicitis. The colon is unremarkable in appearance.  The bladder is mildly distended and grossly unremarkable in appearance. The prostate is diminutive and grossly unremarkable. No inguinal lymphadenopathy is seen.  No acute osseous abnormalities are identified. Mild degenerative change is noted at the pubic symphysis. Multilevel vacuum phenomenon is noted along the lower thoracic and lumbar spine.  IMPRESSION: 1. No acute abnormality seen to explain the patient's symptoms. 2. Nonobstructing 1.0 cm stone at the lower pole of the left kidney. 3. No evidence of hydronephrosis. Small right renal cyst noted. 4. Diffuse fatty infiltration within the liver.   Electronically Signed   By: Garald Balding M.D.   On: 08/12/2014 01:48   Ct Foot Left W Contrast  08/14/2014   CLINICAL DATA:  Cellulitis left lower leg.  Venous stasis.  Sepsis.  EXAM: CT OF THE LOWER BILATERAL EXTREMITY WITH CONTRAST  TECHNIQUE: Multidetector CT imaging of the lower extremities, scanning side by side, was performed according to the standard protocol  following intravenous contrast administration.  COMPARISON:  Radiographs 07/17/2013  CONTRAST:  15m OMNIPAQUE IOHEXOL 300 MG/ML  SOLN  FINDINGS: There is marked subcutaneous edema throughout the left lower extremity consistent with the described history of cellulitis. There is no soft tissue gas. There is no drainable fluid collection. The bones are intact. There is no bone lesion or bony destruction.  There is no foreign body evident.  IMPRESSION: *Marked subcutaneous edema consistent with cellulitis, left lower extremity *No soft tissue gas. No foreign body. No drainable fluid collection. *No evidence of osteomyelitis.   Electronically Signed   By: DAndreas NewportM.D.   On: 08/14/2014 02:46   Dg Chest Port 1 View  08/12/2014   CLINICAL DATA:  Acute onset of right-sided abdominal pain. Fever. Dysuria, nausea and vomiting. Initial encounter.  EXAM: PORTABLE CHEST - 1 VIEW  COMPARISON:  Chest radiograph performed 07/09/2013  FINDINGS: The lungs are well-aerated. Vascular congestion is noted. Increased interstitial markings may reflect mild interstitial edema or possibly pneumonia, given the patient's symptoms. There is no evidence of pleural effusion or pneumothorax.  The cardiomediastinal silhouette is within normal limits. No acute osseous abnormalities are seen.  IMPRESSION: Vascular congestion noted. Increased interstitial markings may reflect mild interstitial edema or possibly pneumonia, given the patient's symptoms.   Electronically Signed   By: JGarald BaldingM.D.   On: 08/12/2014 04:14   Ct Extrem Lower W Cm Bil  08/14/2014   CLINICAL DATA:  Cellulitis left lower leg.  Venous stasis.  Sepsis.  EXAM: CT OF THE LOWER BILATERAL EXTREMITY WITH CONTRAST  TECHNIQUE: Multidetector  CT imaging of the lower extremities, scanning side by side, was performed according to the standard protocol following intravenous contrast administration.  COMPARISON:  Radiographs 07/17/2013  CONTRAST:  152m OMNIPAQUE IOHEXOL  300 MG/ML  SOLN  FINDINGS: There is marked subcutaneous edema throughout the left lower extremity consistent with the described history of cellulitis. There is no soft tissue gas. There is no drainable fluid collection. The bones are intact. There is no bone lesion or bony destruction.  There is no foreign body evident.  IMPRESSION: *Marked subcutaneous edema consistent with cellulitis, left lower extremity *No soft tissue gas. No foreign body. No drainable fluid collection. *No evidence of osteomyelitis.   Electronically Signed   By: DAndreas NewportM.D.   On: 08/14/2014 02:46        Subjective: He is feeling better today. Breathing better.  Relates redness and swelling lower extremity improved.  Had multiple BM yesterday.  Wants to eat , tired of liquid diet   Objective: Filed Vitals:   08/13/14 1937 08/13/14 2149 08/14/14 0223 08/14/14 0630  BP: 115/56   95/46  Pulse: 85   73  Temp: 101.9 F (38.8 C) 99.8 F (37.7 C)  98.7 F (37.1 C)  TempSrc: Oral Oral  Oral  Resp: 20   20  Height:      Weight:      SpO2: 99%  90% 93%    Intake/Output Summary (Last 24 hours) at 08/14/14 1406 Last data filed at 08/14/14 0313  Gross per 24 hour  Intake   1340 ml  Output      0 ml  Net   1340 ml   Weight change:  Exam:   General:  Pt is alert, follows commands appropriately, not in acute distress  HEENT: No icterus, No thrush, [place]/AT  Cardiovascular: RRR, S1/S2, no rubs, no gallops  Respiratory: Bibasilar crackles. No wheeze.  Abdomen: Soft/+BS, non tender, non distended, no guarding  Extremities: 3+ edema LLE with erythema from the suprapatellar area extending into the dorsum of the left foot. There is no crepitance. There is no necrosis. No draining wounds. Data Reviewed: Basic Metabolic Panel:  Recent Labs Lab 08/11/14 2355 08/12/14 0705 08/13/14 0551 08/14/14 0605  NA 131* 133* 131* 134*  K 3.5 3.7 3.5 2.8*  CL 97* 100* 96* 102  CO2 _0 GLUCOSE 147* 108*  77 82  BUN _1 CREATININE 0.70 0.83 0.83 0.76  CALCIUM 8.9 8.4* 7.7* 7.8*   Liver Function Tests:  Recent Labs Lab 08/11/14 2355 08/12/14 0705  AST 36 31  ALT 31 25  ALKPHOS 56 51  BILITOT 0.7 0.7  PROT 7.7 7.0  ALBUMIN 4.0 3.5    Recent Labs Lab 08/12/14 0030  LIPASE 30   No results for input(s): AMMONIA in the last 168 hours. CBC:  Recent Labs Lab 08/11/14 2355 08/12/14 0705 08/13/14 0551 08/14/14 0605  WBC 16.2* 20.0* 13.6* 7.7  NEUTROABS 15.0* 19.3*  --   --   HGB 13.7 12.3* 12.3* 10.9*  HCT 40.8 35.9* 35.9* 32.1*  MCV 86.4 84.9 84.5 84.7  PLT 194 174 123* 118*   Cardiac Enzymes:  Recent Labs Lab 08/13/14 0551  CKTOTAL 349   BNP: Invalid input(s): POCBNP CBG: No results for input(s): GLUCAP in the last 168 hours.  Recent Results (from the past 240 hour(s))  Urine culture     Status: None   Collection Time: 08/11/14 11:39 PM  Result Value Ref Range Status  Specimen Description URINE, CLEAN CATCH  Final   Special Requests NONE  Final   Colony Count NO GROWTH Performed at [facility]   Final   Culture NO GROWTH Performed at [facility]   Final   Report Status 08/13/2014 FINAL  Final  Culture, blood (routine x 2)     Status: None (Preliminary result)   Collection Time: 08/12/14 12:25 AM  Result Value Ref Range Status   Specimen Description BLOOD RIGHT ARM  Final   Special Requests BOTTLES DRAWN AEROBIC AND ANAEROBIC 4CC EACH  Final   Culture   Final    STREPTOCOCCUS GROUP G Note: Gram Stain Report Called to,Read Back By and Verified With: RUTH LEIGHT RN [phone] Performed at [facility]    Report Status PENDING  Incomplete  Culture, blood (routine x 2)     Status: None (Preliminary result)   Collection Time: 08/12/14 12:29 AM  Result Value Ref Range Status   Specimen Description BLOOD RIGHT HAND  Final   Special Requests BOTTLES DRAWN AEROBIC AND ANAEROBIC 5CC EACH  Final   Culture   Final     STREPTOCOCCUS GROUP G Note: Gram Stain Report Called to,Read Back By and Verified With: RUTH LEIGH RN [id number] Performed at [facility]    Report Status PENDING  Incomplete     Scheduled Meds: . enoxaparin (LOVENOX) injection  100 mg Subcutaneous Daily  . guaiFENesin  600 mg Oral BID  . ipratropium-albuterol  3 mL Nebulization Q6H  . methadone  180 mg Oral Q breakfast  . nicotine  21 mg Transdermal Daily  . pneumococcal 23 valent vaccine  0.5 mL Intramuscular Tomorrow-1000  . potassium chloride  40 mEq Oral BID  . senna  2 tablet Oral Daily  . sodium chloride  3 mL Intravenous Q12H  . vancomycin  2,000 mg Intravenous Q8H   Continuous Infusions:    Regalado, Belkys A, DO  [facility] Pager [phone]  If 7PM-7AM, please contact night-coverage [email or web address] Password [email or web address] 08/14/2014, 2:06 PM   LOS: 2 days

## 2014-08-15 LAB — CULTURE, BLOOD (ROUTINE X 2)

## 2014-08-15 LAB — CBC
HEMATOCRIT: 34.7 % — AB (ref 39.0–52.0)
HEMOGLOBIN: 11.7 g/dL — AB (ref 13.0–17.0)
MCH: 28.9 pg (ref 26.0–34.0)
MCHC: 33.7 g/dL (ref 30.0–36.0)
MCV: 85.7 fL (ref 78.0–100.0)
Platelets: 134 10*3/uL — ABNORMAL LOW (ref 150–400)
RBC: 4.05 MIL/uL — AB (ref 4.22–5.81)
RDW: 14.4 % (ref 11.5–15.5)
WBC: 6 10*3/uL (ref 4.0–10.5)

## 2014-08-15 LAB — BASIC METABOLIC PANEL
Anion gap: 11 (ref 5–15)
BUN: 6 mg/dL (ref 6–20)
CHLORIDE: 100 mmol/L — AB (ref 101–111)
CO2: 23 mmol/L (ref 22–32)
CREATININE: 0.63 mg/dL (ref 0.61–1.24)
Calcium: 8.1 mg/dL — ABNORMAL LOW (ref 8.9–10.3)
GFR calc non Af Amer: >60 mL/min (ref >60)
Glucose, Bld: 101 mg/dL — ABNORMAL HIGH (ref 70–99)
Potassium: 3.7 mmol/L (ref 3.5–5.1)
Sodium: 134 mmol/L — ABNORMAL LOW (ref 135–145)

## 2014-08-15 LAB — CLOSTRIDIUM DIFFICILE BY PCR: Toxigenic C. Difficile by PCR: POSITIVE — AB

## 2014-08-15 MED ORDER — POTASSIUM CHLORIDE CRYS ER 20 MEQ PO TBCR
40.0000 meq | EXTENDED_RELEASE_TABLET | Freq: Every day | ORAL | Status: DC
  Administered 2014-08-15 – 2014-08-17 (×3): 40 meq via ORAL
  Filled 2014-08-15 (×3): qty 2

## 2014-08-15 MED ORDER — CEFTRIAXONE SODIUM IN DEXTROSE 20 MG/ML IV SOLN
1.0000 g | INTRAVENOUS | Status: DC
Start: 2014-08-15 — End: 2014-08-15
  Filled 2014-08-15: qty 50

## 2014-08-15 MED ORDER — CEFTRIAXONE SODIUM IN DEXTROSE 40 MG/ML IV SOLN
2.0000 g | INTRAVENOUS | Status: DC
  Administered 2014-08-15 – 2014-08-17 (×3): 2 g via INTRAVENOUS
  Filled 2014-08-15 (×4): qty 50

## 2014-08-15 MED ORDER — SODIUM CHLORIDE 0.9 % IJ SOLN
10.0000 mL | INTRAMUSCULAR | Status: DC | PRN
  Administered 2014-08-16 – 2014-08-18 (×3): 10 mL
  Administered 2014-08-19: 20 mL
  Administered 2014-08-19: 30 mL
  Administered 2014-08-20: 10 mL
  Filled 2014-08-15 (×6): qty 40

## 2014-08-15 MED ORDER — METRONIDAZOLE 500 MG PO TABS
500.0000 mg | ORAL_TABLET | Freq: Three times a day (TID) | ORAL | Status: DC
  Administered 2014-08-15 – 2014-08-20 (×15): 500 mg via ORAL
  Filled 2014-08-15 (×15): qty 1

## 2014-08-15 MED ORDER — FUROSEMIDE 40 MG PO TABS
40.0000 mg | ORAL_TABLET | Freq: Two times a day (BID) | ORAL | Status: DC
  Administered 2014-08-15 – 2014-08-16 (×2): 40 mg via ORAL
  Filled 2014-08-15 (×2): qty 1

## 2014-08-15 NOTE — Progress Notes (Signed)
Peripherally Inserted Central Catheter/Midline Placement  The IV Nurse has discussed with the patient and/or persons authorized to consent for the patient, the purpose of this procedure and the potential benefits and risks involved with this procedure.  The benefits include less needle sticks, lab draws from the catheter and patient may be discharged home with the catheter.  Risks include, but not limited to, infection, bleeding, blood clot (thrombus formation), and puncture of an artery; nerve damage and irregular heat beat.  Alternatives to this procedure were also discussed.  PICC/Midline Placement Documentation  PICC / Midline Single Lumen 13/14/38 PICC Right Basilic 50 cm 5 cm (Active)  Indication for Insertion or Continuance of Line Poor Vasculature-patient has had multiple peripheral attempts or PIVs lasting less than 24 hours 08/15/2014  6:00 PM  Exposed Catheter (cm) 5 cm 08/15/2014  6:00 PM  Dressing Change Due 08/22/14 08/15/2014  6:00 PM       Jule Economy Horton 08/15/2014, 6:06 PM

## 2014-08-15 NOTE — Progress Notes (Signed)
PROGRESS NOTE  [name] KKX:[id number] DOB: 1960-09-18 DOA: 08/11/2014 PCP: No primary care provider on file.   Brief history 54 year old male with a history of COPD, CHF, morbid obesity, venous stasis of the lower extremities presented with 2 day history of increasing pain, edema, and erythema of his left lower extremity. The patient states that he hit his left leg knocking off a scab which she thinks may have been the cause of his cellulitis. At the time of admission, WBC was 16.2 with temperature 102.86F and lactic acid of 3.30. Venous duplex of his lower extremities was negative for DVT. In addition, the patient was also complaining of shortness of breath for the past 2 days with nonproductive cough. He had some nausea without vomiting. He also has some epigastric abdominal discomfort and left upper quadrant abdominal discomfort. He does have some constipation and has not had a bowel movement and nearly one week. Unfortunately, he continues to smoke 1 pack per day. He has over 100-pack-year history.  Assessment/Plan:  Sepsis Secondary to cellulitis and group G strep bacteremia PCN sensitive.  Discussed with ID, Megan Salon. He rec ceftriaxone for ease of administration until improved then oral amoxicillin for a total of 2 weeks.  Cellulitis is still extremely severe. Will place PICC line, as I suspect it will take some time to improve, and patient having a hard time with peripheral IV "hard stick".  Cellulitis of the left lower extremity  Secondary to the group G streptococcus. See above. Erythema, induration blistering warmth quite impressive still. Needs diuresis. Wound care recommending eventual Unna boots and requests ABIs. This is reasonable. Will order. Would not place Unna boots until significant improvement in cellulitis.  Needs diuresis.  Abdominal pain  CAT scan on admission unremarkable. Initially, constipated and started on laxatives. Someone ordered a C. difficile and  it came back positive. However, patient is having formed stool. Has no abdominal pain currently, nausea or vomiting. May be a false positive, but as he will be on antibiotics for 2 weeks, it is reasonable to start Flagyl and monitor. Discussed with Dr. Megan Salon.  It is also reasonable to just watch for signs of developing colitis.  Thrombocytopenia;  Suspect related to infection. mild  Hypokalemia;  Repleted  Acute respiratory failure/hypoxemia  -Suspect he had 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. No clinical evidence of CHF. Echocardiogram pending.   Tobacco abuse/COPD  -NicoDerm patch  -Tobacco cessation discussed  -Continue aerosolized albuterol and Atrovent   Hyponatremia  -Likely secondary to volume overload -Saline lock IV fluids  History of polysubstance abuse -Continue home dose methadone   Family Communication: friend at bedside  Disposition Plan: Home when medically stable    Procedures/Studies: Dg Chest 2 View  08/14/2014   CLINICAL DATA:  Bilateral leg swelling and short of breath  EXAM: CHEST  2 VIEW  COMPARISON:  Radiograph 08/12/2014  FINDINGS: Stable enlarged cardiac silhouette. There is increased central venous congestion and mild interstitial edema. No pleural fluid. No focal consolidation. No pneumothorax.  IMPRESSION: Increased central venous congestion and interstitial edema.   Electronically Signed   By: Suzy Bouchard M.D.   On: 08/14/2014 01:57   Ct Abdomen Pelvis W Contrast  08/12/2014   CLINICAL DATA:  Acute onset of generalized abdominal pain and back pain. Dysuria. Nausea and vomiting. Initial encounter.  EXAM: CT ABDOMEN AND PELVIS WITH CONTRAST  TECHNIQUE: Multidetector CT imaging of the abdomen and pelvis was performed using the standard protocol  following bolus administration of intravenous contrast.  CONTRAST:  100 mL of Omnipaque 300 IV contrast  COMPARISON:  CT of the abdomen and pelvis from 07/12/2011  FINDINGS: The  visualized lung bases are clear.  There is diffuse fatty infiltration within the liver. The liver and spleen are otherwise unremarkable. The gallbladder is within normal limits. The pancreas and adrenal glands are unremarkable.  A 2.9 cm cyst is noted at the upper pole of the right kidney. Mild nonspecific perinephric stranding is noted bilaterally. A nonobstructing 1.0 cm stone is noted at the lower pole of the left kidney. The kidneys are otherwise unremarkable. There is no evidence of hydronephrosis. No obstructing ureteral stones are identified.  No free fluid is identified. The small bowel is unremarkable in appearance. The stomach is within normal limits. No acute vascular abnormalities are seen. Mild calcification is noted along the abdominal aorta.  The appendix is normal in caliber, without evidence of appendicitis. The colon is unremarkable in appearance.  The bladder is mildly distended and grossly unremarkable in appearance. The prostate is diminutive and grossly unremarkable. No inguinal lymphadenopathy is seen.  No acute osseous abnormalities are identified. Mild degenerative change is noted at the pubic symphysis. Multilevel vacuum phenomenon is noted along the lower thoracic and lumbar spine.  IMPRESSION: 1. No acute abnormality seen to explain the patient's symptoms. 2. Nonobstructing 1.0 cm stone at the lower pole of the left kidney. 3. No evidence of hydronephrosis. Small right renal cyst noted. 4. Diffuse fatty infiltration within the liver.   Electronically Signed   By: Garald Balding M.D.   On: 08/12/2014 01:48   Ct Foot Left W Contrast  08/14/2014   CLINICAL DATA:  Cellulitis left lower leg.  Venous stasis.  Sepsis.  EXAM: CT OF THE LOWER BILATERAL EXTREMITY WITH CONTRAST  TECHNIQUE: Multidetector CT imaging of the lower extremities, scanning side by side, was performed according to the standard protocol following intravenous contrast administration.  COMPARISON:  Radiographs 07/17/2013   CONTRAST:  159mL OMNIPAQUE IOHEXOL 300 MG/ML  SOLN  FINDINGS: There is marked subcutaneous edema throughout the left lower extremity consistent with the described history of cellulitis. There is no soft tissue gas. There is no drainable fluid collection. The bones are intact. There is no bone lesion or bony destruction.  There is no foreign body evident.  IMPRESSION: *Marked subcutaneous edema consistent with cellulitis, left lower extremity *No soft tissue gas. No foreign body. No drainable fluid collection. *No evidence of osteomyelitis.   Electronically Signed   By: Andreas Newport M.D.   On: 08/14/2014 02:46   Dg Chest Port 1 View  08/12/2014   CLINICAL DATA:  Acute onset of right-sided abdominal pain. Fever. Dysuria, nausea and vomiting. Initial encounter.  EXAM: PORTABLE CHEST - 1 VIEW  COMPARISON:  Chest radiograph performed 07/09/2013  FINDINGS: The lungs are well-aerated. Vascular congestion is noted. Increased interstitial markings may reflect mild interstitial edema or possibly pneumonia, given the patient's symptoms. There is no evidence of pleural effusion or pneumothorax.  The cardiomediastinal silhouette is within normal limits. No acute osseous abnormalities are seen.  IMPRESSION: Vascular congestion noted. Increased interstitial markings may reflect mild interstitial edema or possibly pneumonia, given the patient's symptoms.   Electronically Signed   By: Garald Balding M.D.   On: 08/12/2014 04:14   Ct Extrem Lower W Cm Bil  08/14/2014   CLINICAL DATA:  Cellulitis left lower leg.  Venous stasis.  Sepsis.  EXAM: CT OF THE LOWER BILATERAL EXTREMITY  WITH CONTRAST  TECHNIQUE: Multidetector CT imaging of the lower extremities, scanning side by side, was performed according to the standard protocol following intravenous contrast administration.  COMPARISON:  Radiographs 07/17/2013  CONTRAST:  152mL OMNIPAQUE IOHEXOL 300 MG/ML  SOLN  FINDINGS: There is marked subcutaneous edema throughout the left  lower extremity consistent with the described history of cellulitis. There is no soft tissue gas. There is no drainable fluid collection. The bones are intact. There is no bone lesion or bony destruction.  There is no foreign body evident.  IMPRESSION: *Marked subcutaneous edema consistent with cellulitis, left lower extremity *No soft tissue gas. No foreign body. No drainable fluid collection. *No evidence of osteomyelitis.   Electronically Signed   By: Andreas Newport M.D.   On: 08/14/2014 02:46     Subjective: Took a shower earlier today. No diarrhea. No nausea vomiting or abdominal pain. Leg about the same.   Objective: Filed Vitals:   08/15/14 0440 08/15/14 0500 08/15/14 0816 08/15/14 1300  BP: 118/64   142/73  Pulse: 80   80  Temp: 99.5 F (37.5 C)   99.3 F (37.4 C)  TempSrc: Oral     Resp: 18   18  Height:      Weight:  195.047 kg (430 lb)    SpO2: 97%  94% 92%    Intake/Output Summary (Last 24 hours) at 08/15/14 1315 Last data filed at 08/15/14 1109  Gross per 24 hour  Intake   2463 ml  Output      0 ml  Net   2463 ml   Weight change:  Exam:   General:  morbidly obese. Alert oriented nontoxic and comfortable.  HEENT: No thrush  Cardiovascular: RRR, S1/S2, no rubs, no gallops  Respiratory: diminished throughout without wheezes rhonchi or rales   Abdomen: Obese, soft, nontender. Bowel sounds present  Extremities: extreme erythema shiny tight skin with blistering of the left leg warm and tender, indurated  Data Reviewed: Basic Metabolic Panel:  Recent Labs Lab 08/11/14 2355 08/12/14 0705 08/13/14 0551 08/14/14 0605 08/15/14 0617  NA 131* 133* 131* 134* 134*  K 3.5 3.7 3.5 2.8* 3.7  CL 97* 100* 96* 102 100*  CO2 25 23 22 23 23   GLUCOSE 147* 108* 77 82 101*  BUN 9 10 10 6 6   CREATININE 0.70 0.83 0.83 0.76 0.63  CALCIUM 8.9 8.4* 7.7* 7.8* 8.1*   Liver Function Tests:  Recent Labs Lab 08/11/14 2355 08/12/14 0705  AST 36 31  ALT 31 25    ALKPHOS 56 51  BILITOT 0.7 0.7  PROT 7.7 7.0  ALBUMIN 4.0 3.5    Recent Labs Lab 08/12/14 0030  LIPASE 30   No results for input(s): AMMONIA in the last 168 hours. CBC:  Recent Labs Lab 08/11/14 2355 08/12/14 0705 08/13/14 0551 08/14/14 0605 08/15/14 0617  WBC 16.2* 20.0* 13.6* 7.7 6.0  NEUTROABS 15.0* 19.3*  --   --   --   HGB 13.7 12.3* 12.3* 10.9* 11.7*  HCT 40.8 35.9* 35.9* 32.1* 34.7*  MCV 86.4 84.9 84.5 84.7 85.7  PLT 194 174 123* 118* 134*   Cardiac Enzymes:  Recent Labs Lab 08/13/14 0551  CKTOTAL 349   BNP: Invalid input(s): POCBNP CBG: No results for input(s): GLUCAP in the last 168 hours.  Recent Results (from the past 240 hour(s))  Urine culture     Status: None   Collection Time: 08/11/14 11:39 PM  Result Value Ref Range Status   Specimen Description  URINE, CLEAN CATCH  Final   Special Requests NONE  Final   Colony Count NO GROWTH Performed at [facility]   Final   Culture NO GROWTH Performed at [facility]   Final   Report Status 08/13/2014 FINAL  Final  Culture, blood (routine x 2)     Status: None   Collection Time: 08/12/14 12:25 AM  Result Value Ref Range Status   Specimen Description BLOOD RIGHT ARM  Final   Special Requests BOTTLES DRAWN AEROBIC AND ANAEROBIC 4CC EACH  Final   Culture   Final    STREPTOCOCCUS GROUP G Note: Gram Stain Report Called to,Read Back By and Verified With: Minerva Fester RN [phone] Performed at [facility]    Report Status 08/15/2014 FINAL  Final   Organism ID, Bacteria STREPTOCOCCUS GROUP G  Final      Susceptibility   Streptococcus group g - MIC (ETEST)*    PENICILLIN .032 SENSITIVE Sensitive     * STREPTOCOCCUS GROUP G  Culture, blood (routine x 2)     Status: None   Collection Time: 08/12/14 12:29 AM  Result Value Ref Range Status   Specimen Description BLOOD RIGHT HAND  Final   Special Requests BOTTLES DRAWN AEROBIC AND ANAEROBIC 5CC EACH  Final   Culture   Final     STREPTOCOCCUS GROUP G Note: SUSCEPTIBILITIES PERFORMED ON PREVIOUS CULTURE WITHIN THE LAST 5 DAYS. Note: Gram Stain Report Called to,Read Back By and Verified With: Zenovia Jordan RN [phone] Performed at [facility]    Report Status 08/15/2014 FINAL  Final  Culture, blood (routine x 2)     Status: None (Preliminary result)   Collection Time: 08/14/14  5:50 AM  Result Value Ref Range Status   Specimen Description BLOOD LEFT HAND  Final   Special Requests BOTTLES DRAWN AEROBIC ONLY 5CC  Final   Culture   Final           BLOOD CULTURE RECEIVED NO GROWTH TO DATE CULTURE WILL BE HELD FOR 5 DAYS BEFORE ISSUING A FINAL NEGATIVE REPORT Performed at [facility]    Report Status PENDING  Incomplete  Culture, blood (routine x 2)     Status: None (Preliminary result)   Collection Time: 08/14/14  6:05 AM  Result Value Ref Range Status   Specimen Description BLOOD RIGHT HAND  Final   Special Requests BOTTLES DRAWN AEROBIC ONLY 3CC  Final   Culture   Final           BLOOD CULTURE RECEIVED NO GROWTH TO DATE CULTURE WILL BE HELD FOR 5 DAYS BEFORE ISSUING A FINAL NEGATIVE REPORT Performed at [facility]    Report Status PENDING  Incomplete  Clostridium Difficile by PCR     Status: Abnormal   Collection Time: 08/15/14  9:21 AM  Result Value Ref Range Status   C difficile by pcr POSITIVE (A) NEGATIVE Final    Comment: CRITICAL RESULT CALLED TO, READ BACK BY AND VERIFIED WITH: Freddi Starr RN 12:35 08/15/14 ([email or web address])      Scheduled Meds: . cefTRIAXone (ROCEPHIN)  IV  1 g Intravenous Q24H  . enoxaparin (LOVENOX) injection  100 mg Subcutaneous Daily  . guaiFENesin  600 mg Oral BID  . ipratropium-albuterol  3 mL Nebulization Q6H  . methadone  180 mg Oral Q breakfast  . metroNIDAZOLE  500 mg Oral 3 times per day  . nicotine  21 mg Transdermal Daily  . pneumococcal 23 valent vaccine  0.5 mL Intramuscular Tomorrow-1000  . sodium chloride  3 mL Intravenous Q12H   Continuous  Infusions:    Delfina Redwood, MD [facility] Pager [phone]  [email or web address] Password [email or web address] 08/15/2014, 1:15 PM   LOS: 3 days

## 2014-08-15 NOTE — Consult Note (Signed)
WOC wound consult note Reason for Consult: bilateral LE edema and LLE cellulitis.  New onset of blistering and weeping today.  Wound type:cellulitis with venous stasis disease  Measurement: scattered blistering over the inner aspect of the left calf, larger serous filled blister on the lateral calf.  Scab noted and the patient reports trauma to this area from [place].  I suspect this is what has led to his cellulitis in combination with his venous disease.  Weeping over the calf and pretibial region, serous fluid. Wound bed: weeping but no open wounds at this time.  Drainage (amount, consistency, odor) serous, no odor Periwound: intense erythema that has been marked on the inner thigh.   Dressing procedure/placement/frequency: Patient and I discussed venous stasis disease and he seems to think "no one has ever known what was wrong with his legs".  He reports "it was mentioned one time that I need to wear stockings".  He has not had any compression therapy in the past and I have explained the rationale for the therapy and the need for long term management to keep his legs from having recurrent ulcerations. Elevation of the legs is helpful but long term use of stocking would be the best.  He will have difficulty with donning stockings so he may need assistance with this at home eventually.  Would like to place Unna's boot on the RLE since the patient is ambulatory, will monitor LLE for a few days to see if the cellulitis and blistering improves before any compression to be applied.  I have requested to have ABI's obtained in order to make sure for the safety of compression.  WOC will follow along with you for further needs either topical care of the LLE if needed if the bulla open and for compression therapy needs.  [place], [place]

## 2014-08-15 NOTE — Evaluation (Signed)
Physical Therapy Evaluation Patient Details Name: [name] MRN: [id number] DOB: March 06, 1961 Today's Date: 08/15/2014   History of Present Illness  Patient is a 55 y/o male admitted with abdominal pain. Chest x-ray shows vascular congestion with interstitial edema with possible pneumonia. Presents with sepsis secondary to cellulitis as well as possible COPD exacerbationPMH of chronic venous insufficiency, morbid obesity, CHF, COPD and substance abuse currently on methadone.    Clinical Impression  Patient presents close to functional baseline and able to ambulate community distances and negotiate steps without difficulty. 2/4 dyspnea scale - reports as baseline. 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 Education provided on importance of mobility and ambulating while in hospital. Pt does not require further skilled therapy services. Discharge from therapy.    Follow Up Recommendations No PT follow up    Equipment Recommendations  None recommended by PT    Recommendations for Other Services       Precautions / Restrictions Precautions Precautions: None Restrictions Weight Bearing Restrictions: No      Mobility  Bed Mobility Overal bed mobility: Modified Independent                Transfers Overall transfer level: Modified independent                  Ambulation/Gait Ambulation/Gait assistance: Modified independent (Device/Increase time) Ambulation Distance (Feet): 150 Feet Assistive device:  (IV pole) Gait Pattern/deviations: Wide base of support;Step-through pattern     General Gait Details: Waddling like gait pattern secondary to body habitus. Dyspnea present. No LOB. Pt reports as baseline.  Stairs Stairs: Yes Stairs assistance: Modified independent (Device/Increase time) Stair Management: Two rails;Step to pattern Number of Stairs: 3 (+ 2 steps.) General stair comments: Cues for technique and safety.  Wheelchair Mobility    Modified Rankin (Stroke Patients  Only)       Balance Overall balance assessment: No apparent balance deficits (not formally assessed)                                           Pertinent Vitals/Pain Pain Assessment: No/denies pain    Home Living Family/patient expects to be discharged to:: Private residence Living Arrangements: Parent Available Help at Discharge: Family;Available 24 hours/day Type of Home: Mobile home Home Access: Stairs to enter Entrance Stairs-Rails: Right;Left Entrance Stairs-Number of Steps: 2 Home Layout: One level Home Equipment: Cane - single point      Prior Function Level of Independence: Independent         Comments: 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     Hand Dominance        Extremity/Trunk Assessment   Upper Extremity Assessment: Defer to OT evaluation;Overall [facility] for tasks assessed           Lower Extremity Assessment: Overall WFL for tasks assessed;LLE deficits/detail   LLE Deficits / Details: Redness, swelling and erythema LLE distal to knee. Weeping     Communication   Communication: No difficulties  Cognition Arousal/Alertness: Awake/alert Behavior During Therapy: WFL for tasks assessed/performed Overall Cognitive Status: Within Functional Limits for tasks assessed                      General Comments      Exercises        Assessment/Plan    PT Assessment Patent does not need any further PT services  PT Diagnosis  PT Problem List    PT Treatment Interventions     PT Goals (Current goals can be found in the Care Plan section) Acute Rehab PT Goals PT Goal Formulation: All assessment and education complete, DC therapy    Frequency     Barriers to discharge        Co-evaluation               End of Session   Activity Tolerance: Patient tolerated treatment well Patient left: in bed;with call bell/phone within reach;with family/visitor present Nurse Communication: Mobility status         Time:  3662-9476 PT Time Calculation (min) (ACUTE ONLY): 14 min   Charges:   PT Evaluation $Initial PT Evaluation Tier I: 1 Procedure     PT G CodesCandy Sledge A 09/14/14, 4:10 PM Wray Kearns, [place], DPT [phone]

## 2014-08-16 ENCOUNTER — Encounter (HOSPITAL_COMMUNITY): Payer: Self-pay

## 2014-08-16 ENCOUNTER — Inpatient Hospital Stay (HOSPITAL_COMMUNITY): Payer: Medicaid Other

## 2014-08-16 DIAGNOSIS — L03116 Cellulitis of left lower limb: Secondary | ICD-10-CM

## 2014-08-16 MED ORDER — FUROSEMIDE 10 MG/ML IJ SOLN
40.0000 mg | Freq: Two times a day (BID) | INTRAMUSCULAR | Status: DC
  Administered 2014-08-17 – 2014-08-19 (×6): 40 mg via INTRAVENOUS
  Filled 2014-08-16 (×7): qty 4

## 2014-08-16 NOTE — Progress Notes (Signed)
PROGRESS NOTE  [name] HWT:[id number] DOB: Sep 04, 1960 DOA: 08/11/2014 PCP: No primary care provider on file.   Brief history 54 year old male with a history of COPD, CHF, morbid obesity, venous stasis of the lower extremities presented with 2 day history of increasing pain, edema, and erythema of his left lower extremity. The patient states that he hit his left leg knocking off a scab which she thinks may have been the cause of his cellulitis. At the time of admission, WBC was 16.2 with temperature 102.19F and lactic acid of 3.30. Venous duplex of his lower extremities was negative for DVT. In addition, the patient was also complaining of shortness of breath for the past 2 days with nonproductive cough. He had some nausea without vomiting. He also has some epigastric abdominal discomfort and left upper quadrant abdominal discomfort. He does have some constipation and has not had a bowel movement and nearly one week. Unfortunately, he continues to smoke 1 pack per day. He has over 100-pack-year history.  Assessment/Plan:  Sepsis Secondary to cellulitis and group G strep bacteremia PCN sensitive.  Continue rocephin  Cellulitis of the left lower extremity  Slight improvement. Continue rocephin, elevation and diuresis. ABIs ordered per Bude.  Eventual unna boots  c diff pcr pos On flagyl. No diarrhea.   Thrombocytopenia;  Suspect related to infection. mild  Hypokalemia;  Repleted  Acute respiratory failure/hypoxemia  -Suspect he had 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. No clinical evidence of CHF. Echocardiogram pending.   Tobacco abuse/COPD  -NicoDerm patch  -Tobacco cessation discussed  -Continue aerosolized albuterol and Atrovent   Hyponatremia  -Likely secondary to volume overload -Saline lock IV fluids  History of polysubstance abuse -Continue home dose methadone   Family Communication: friend at bedside  Disposition Plan: Home  when medically stable    Procedures/Studies: Dg Chest 2 View  08/14/2014   CLINICAL DATA:  Bilateral leg swelling and short of breath  EXAM: CHEST  2 VIEW  COMPARISON:  Radiograph 08/12/2014  FINDINGS: Stable enlarged cardiac silhouette. There is increased central venous congestion and mild interstitial edema. No pleural fluid. No focal consolidation. No pneumothorax.  IMPRESSION: Increased central venous congestion and interstitial edema.   Electronically Signed   By: Suzy Bouchard M.D.   On: 08/14/2014 01:57   Ct Abdomen Pelvis W Contrast  08/12/2014   CLINICAL DATA:  Acute onset of generalized abdominal pain and back pain. Dysuria. Nausea and vomiting. Initial encounter.  EXAM: CT ABDOMEN AND PELVIS WITH CONTRAST  TECHNIQUE: Multidetector CT imaging of the abdomen and pelvis was performed using the standard protocol following bolus administration of intravenous contrast.  CONTRAST:  100 mL of Omnipaque 300 IV contrast  COMPARISON:  CT of the abdomen and pelvis from 07/12/2011  FINDINGS: The visualized lung bases are clear.  There is diffuse fatty infiltration within the liver. The liver and spleen are otherwise unremarkable. The gallbladder is within normal limits. The pancreas and adrenal glands are unremarkable.  A 2.9 cm cyst is noted at the upper pole of the right kidney. Mild nonspecific perinephric stranding is noted bilaterally. A nonobstructing 1.0 cm stone is noted at the lower pole of the left kidney. The kidneys are otherwise unremarkable. There is no evidence of hydronephrosis. No obstructing ureteral stones are identified.  No free fluid is identified. The small bowel is unremarkable in appearance. The stomach is within normal limits. No acute vascular abnormalities are seen. Mild calcification is noted along  the abdominal aorta.  The appendix is normal in caliber, without evidence of appendicitis. The colon is unremarkable in appearance.  The bladder is mildly distended and grossly  unremarkable in appearance. The prostate is diminutive and grossly unremarkable. No inguinal lymphadenopathy is seen.  No acute osseous abnormalities are identified. Mild degenerative change is noted at the pubic symphysis. Multilevel vacuum phenomenon is noted along the lower thoracic and lumbar spine.  IMPRESSION: 1. No acute abnormality seen to explain the patient's symptoms. 2. Nonobstructing 1.0 cm stone at the lower pole of the left kidney. 3. No evidence of hydronephrosis. Small right renal cyst noted. 4. Diffuse fatty infiltration within the liver.   Electronically Signed   By: Garald Balding M.D.   On: 08/12/2014 01:48   Ct Foot Left W Contrast  08/14/2014   CLINICAL DATA:  Cellulitis left lower leg.  Venous stasis.  Sepsis.  EXAM: CT OF THE LOWER BILATERAL EXTREMITY WITH CONTRAST  TECHNIQUE: Multidetector CT imaging of the lower extremities, scanning side by side, was performed according to the standard protocol following intravenous contrast administration.  COMPARISON:  Radiographs 07/17/2013  CONTRAST:  168mL OMNIPAQUE IOHEXOL 300 MG/ML  SOLN  FINDINGS: There is marked subcutaneous edema throughout the left lower extremity consistent with the described history of cellulitis. There is no soft tissue gas. There is no drainable fluid collection. The bones are intact. There is no bone lesion or bony destruction.  There is no foreign body evident.  IMPRESSION: *Marked subcutaneous edema consistent with cellulitis, left lower extremity *No soft tissue gas. No foreign body. No drainable fluid collection. *No evidence of osteomyelitis.   Electronically Signed   By: Andreas Newport M.D.   On: 08/14/2014 02:46   Dg Chest Port 1 View  08/12/2014   CLINICAL DATA:  Acute onset of right-sided abdominal pain. Fever. Dysuria, nausea and vomiting. Initial encounter.  EXAM: PORTABLE CHEST - 1 VIEW  COMPARISON:  Chest radiograph performed 07/09/2013  FINDINGS: The lungs are well-aerated. Vascular congestion is  noted. Increased interstitial markings may reflect mild interstitial edema or possibly pneumonia, given the patient's symptoms. There is no evidence of pleural effusion or pneumothorax.  The cardiomediastinal silhouette is within normal limits. No acute osseous abnormalities are seen.  IMPRESSION: Vascular congestion noted. Increased interstitial markings may reflect mild interstitial edema or possibly pneumonia, given the patient's symptoms.   Electronically Signed   By: Garald Balding M.D.   On: 08/12/2014 04:14   Ct Extrem Lower W Cm Bil  08/14/2014   CLINICAL DATA:  Cellulitis left lower leg.  Venous stasis.  Sepsis.  EXAM: CT OF THE LOWER BILATERAL EXTREMITY WITH CONTRAST  TECHNIQUE: Multidetector CT imaging of the lower extremities, scanning side by side, was performed according to the standard protocol following intravenous contrast administration.  COMPARISON:  Radiographs 07/17/2013  CONTRAST:  165mL OMNIPAQUE IOHEXOL 300 MG/ML  SOLN  FINDINGS: There is marked subcutaneous edema throughout the left lower extremity consistent with the described history of cellulitis. There is no soft tissue gas. There is no drainable fluid collection. The bones are intact. There is no bone lesion or bony destruction.  There is no foreign body evident.  IMPRESSION: *Marked subcutaneous edema consistent with cellulitis, left lower extremity *No soft tissue gas. No foreign body. No drainable fluid collection. *No evidence of osteomyelitis.   Electronically Signed   By: Andreas Newport M.D.   On: 08/14/2014 02:46  echo (titled as ABI in epic) Left ventricle: The cavity size was normal. Wall  thickness was increased in a pattern of mild LVH. Systolic function was normal. The estimated ejection fraction was in the range of 50% to 55%. Regional wall motion abnormalities cannot be excluded. Left ventricular diastolic function parameters were normal. - Right atrium: The atrium was mildly  dilated.    Subjective: No diarrhea, n/v/ abd pain. No new complaints  Objective: Filed Vitals:   08/15/14 2149 08/16/14 0213 08/16/14 0507 08/16/14 0924  BP: 136/66  125/60   Pulse: 90 69 72   Temp: 100.4 F (38 C)  98.3 F (36.8 C)   TempSrc: Oral     Resp: 20 18 20    Height:      Weight:      SpO2: 94% 95% 97% 100%    Intake/Output Summary (Last 24 hours) at 08/16/14 1318 Last data filed at 08/16/14 0908  Gross per 24 hour  Intake    600 ml  Output      0 ml  Net    600 ml   Weight change:  Exam:   General:  morbidly obese. Alert oriented nontoxic and comfortable.  HEENT: No thrush  Cardiovascular: RRR, S1/S2, no rubs, no gallops  Respiratory: diminished throughout without wheezes rhonchi or rales   Abdomen: Obese, soft, nontender. Bowel sounds present  Extremities: slight improvement in edema and intense erythema, but still quite severe, and weeping from blister  Data Reviewed: Basic Metabolic Panel:  Recent Labs Lab 08/11/14 2355 08/12/14 0705 08/13/14 0551 08/14/14 0605 08/15/14 0617  NA 131* 133* 131* 134* 134*  K 3.5 3.7 3.5 2.8* 3.7  CL 97* 100* 96* 102 100*  CO2 25 23 22 23 23   GLUCOSE 147* 108* 77 82 101*  BUN 9 10 10 6 6   CREATININE 0.70 0.83 0.83 0.76 0.63  CALCIUM 8.9 8.4* 7.7* 7.8* 8.1*   Liver Function Tests:  Recent Labs Lab 08/11/14 2355 08/12/14 0705  AST 36 31  ALT 31 25  ALKPHOS 56 51  BILITOT 0.7 0.7  PROT 7.7 7.0  ALBUMIN 4.0 3.5    Recent Labs Lab 08/12/14 0030  LIPASE 30   No results for input(s): AMMONIA in the last 168 hours. CBC:  Recent Labs Lab 08/11/14 2355 08/12/14 0705 08/13/14 0551 08/14/14 0605 08/15/14 0617  WBC 16.2* 20.0* 13.6* 7.7 6.0  NEUTROABS 15.0* 19.3*  --   --   --   HGB 13.7 12.3* 12.3* 10.9* 11.7*  HCT 40.8 35.9* 35.9* 32.1* 34.7*  MCV 86.4 84.9 84.5 84.7 85.7  PLT 194 174 123* 118* 134*   Cardiac Enzymes:  Recent Labs Lab 08/13/14 0551  CKTOTAL 349   BNP: Invalid  input(s): POCBNP CBG: No results for input(s): GLUCAP in the last 168 hours.  Recent Results (from the past 240 hour(s))  Urine culture     Status: None   Collection Time: 08/11/14 11:39 PM  Result Value Ref Range Status   Specimen Description URINE, CLEAN CATCH  Final   Special Requests NONE  Final   Colony Count NO GROWTH Performed at [facility]   Final   Culture NO GROWTH Performed at [facility]   Final   Report Status 08/13/2014 FINAL  Final  Culture, blood (routine x 2)     Status: None   Collection Time: 08/12/14 12:25 AM  Result Value Ref Range Status   Specimen Description BLOOD RIGHT ARM  Final   Special Requests BOTTLES DRAWN AEROBIC AND ANAEROBIC [facility]  Final   Culture  Final    STREPTOCOCCUS GROUP G Note: Gram Stain Report Called to,Read Back By and Verified With: Minerva Fester RN [phone] Performed at [facility]    Report Status 08/15/2014 FINAL  Final   Organism ID, Bacteria STREPTOCOCCUS GROUP G  Final      Susceptibility   Streptococcus group g - MIC (ETEST)*    PENICILLIN .032 SENSITIVE Sensitive     * STREPTOCOCCUS GROUP G  Culture, blood (routine x 2)     Status: None   Collection Time: 08/12/14 12:29 AM  Result Value Ref Range Status   Specimen Description BLOOD RIGHT HAND  Final   Special Requests BOTTLES DRAWN AEROBIC AND ANAEROBIC 5CC EACH  Final   Culture   Final    STREPTOCOCCUS GROUP G Note: SUSCEPTIBILITIES PERFORMED ON PREVIOUS CULTURE WITHIN THE LAST 5 DAYS. Note: Gram Stain Report Called to,Read Back By and Verified With: Zenovia Jordan RN [phone] Performed at [facility]    Report Status 08/15/2014 FINAL  Final  Culture, blood (routine x 2)     Status: None (Preliminary result)   Collection Time: 08/14/14  5:50 AM  Result Value Ref Range Status   Specimen Description BLOOD LEFT HAND  Final   Special Requests BOTTLES DRAWN AEROBIC ONLY 5CC  Final   Culture   Final           BLOOD CULTURE RECEIVED NO  GROWTH TO DATE CULTURE WILL BE HELD FOR 5 DAYS BEFORE ISSUING A FINAL NEGATIVE REPORT Performed at [facility]    Report Status PENDING  Incomplete  Culture, blood (routine x 2)     Status: None (Preliminary result)   Collection Time: 08/14/14  6:05 AM  Result Value Ref Range Status   Specimen Description BLOOD RIGHT HAND  Final   Special Requests BOTTLES DRAWN AEROBIC ONLY 3CC  Final   Culture   Final           BLOOD CULTURE RECEIVED NO GROWTH TO DATE CULTURE WILL BE HELD FOR 5 DAYS BEFORE ISSUING A FINAL NEGATIVE REPORT Performed at [facility]    Report Status PENDING  Incomplete  Clostridium Difficile by PCR     Status: Abnormal   Collection Time: 08/15/14  9:21 AM  Result Value Ref Range Status   C difficile by pcr POSITIVE (A) NEGATIVE Final    Comment: CRITICAL RESULT CALLED TO, READ BACK BY AND VERIFIED WITH: Freddi Starr RN 12:35 08/15/14 ([email or web address])      Scheduled Meds: . cefTRIAXone (ROCEPHIN)  IV  2 g Intravenous Q24H  . enoxaparin (LOVENOX) injection  100 mg Subcutaneous Daily  . furosemide  40 mg Oral BID  . guaiFENesin  600 mg Oral BID  . ipratropium-albuterol  3 mL Nebulization Q6H  . methadone  180 mg Oral Q breakfast  . metroNIDAZOLE  500 mg Oral 3 times per day  . nicotine  21 mg Transdermal Daily  . pneumococcal 23 valent vaccine  0.5 mL Intramuscular Tomorrow-1000  . potassium chloride  40 mEq Oral Daily  . sodium chloride  3 mL Intravenous Q12H   Continuous Infusions:    Delfina Redwood, MD [facility] Pager [phone]  [email or web address] Password [email or web address] 08/16/2014, 1:18 PM   LOS: 4 days

## 2014-08-17 ENCOUNTER — Inpatient Hospital Stay (HOSPITAL_COMMUNITY): Payer: Medicaid Other

## 2014-08-17 LAB — BASIC METABOLIC PANEL
Anion gap: 8 (ref 5–15)
CO2: 29 mmol/L (ref 22–32)
Calcium: 8.4 mg/dL — ABNORMAL LOW (ref 8.9–10.3)
Chloride: 99 mmol/L — ABNORMAL LOW (ref 101–111)
Creatinine, Ser: 0.69 mg/dL (ref 0.61–1.24)
GFR calc Af Amer: >60 mL/min (ref >60)
Glucose, Bld: 117 mg/dL — ABNORMAL HIGH (ref 70–99)
Potassium: 3.5 mmol/L (ref 3.5–5.1)
SODIUM: 136 mmol/L (ref 135–145)

## 2014-08-17 MED ORDER — IPRATROPIUM-ALBUTEROL 0.5-2.5 (3) MG/3ML IN SOLN
3.0000 mL | Freq: Three times a day (TID) | RESPIRATORY_TRACT | Status: DC
  Filled 2014-08-17: qty 3

## 2014-08-17 NOTE — Progress Notes (Signed)
Orthopedic Tech Progress Note Patient Details:  [name] 05/01/1960 [id number]  Ortho Devices Type of Ortho Device: Louretta Parma boot Ortho Device/Splint Location: rle Ortho Device/Splint Interventions: Application   Katalin Colledge 08/17/2014, 5:00 PM

## 2014-08-17 NOTE — Progress Notes (Signed)
PROGRESS NOTE  [name] NWG:[id number] DOB: December 22, 1960 DOA: 08/11/2014 PCP: No primary care provider on file.   Brief history 54 year old male with a history of COPD, CHF, morbid obesity, venous stasis of the lower extremities presented with 2 day history of increasing pain, edema, and erythema of his left lower extremity. The patient states that he hit his left leg knocking off a scab which she thinks may have been the cause of his cellulitis. At the time of admission, WBC was 16.2 with temperature 102.28F and lactic acid of 3.30. Venous duplex of his lower extremities was negative for DVT. In addition, the patient was also complaining of shortness of breath for the past 2 days with nonproductive cough. He had some nausea without vomiting. He also has some epigastric abdominal discomfort and left upper quadrant abdominal discomfort. He does have some constipation and has not had a bowel movement and nearly one week. Unfortunately, he continues to smoke 1 pack per day. He has over 100-pack-year history.  Assessment/Plan:  Sepsis Secondary to cellulitis and group G strep bacteremia PCN sensitive.  Continue rocephin  Cellulitis of the left lower extremity  Continues to improve slowly. Still with intense erythema and warmth however. Diuresis helping. ABIs look okay, though incomplete. Continue IV Rocephin until significantly less red and swollen and warm.   c diff pcr pos On flagyl. No diarrhea.   Thrombocytopenia;  Suspect related to infection. mild  Hypokalemia;  Repleted  Acute respiratory failure/hypoxemia  -Suspect he had 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. No clinical evidence of CHF. Echocardiogram pending.   Tobacco abuse/COPD  -NicoDerm patch  -Tobacco cessation discussed  -Continue aerosolized albuterol and Atrovent   Hyponatremia  -Likely secondary to volume overload -Saline lock IV fluids  History of polysubstance abuse -Continue  home dose methadone   Family Communication:  Disposition Plan: Home when medically stable    Procedures/Studies: Dg Chest 2 View  08/14/2014   CLINICAL DATA:  Bilateral leg swelling and short of breath  EXAM: CHEST  2 VIEW  COMPARISON:  Radiograph 08/12/2014  FINDINGS: Stable enlarged cardiac silhouette. There is increased central venous congestion and mild interstitial edema. No pleural fluid. No focal consolidation. No pneumothorax.  IMPRESSION: Increased central venous congestion and interstitial edema.   Electronically Signed   By: Suzy Bouchard M.D.   On: 08/14/2014 01:57   Ct Abdomen Pelvis W Contrast  08/12/2014   CLINICAL DATA:  Acute onset of generalized abdominal pain and back pain. Dysuria. Nausea and vomiting. Initial encounter.  EXAM: CT ABDOMEN AND PELVIS WITH CONTRAST  TECHNIQUE: Multidetector CT imaging of the abdomen and pelvis was performed using the standard protocol following bolus administration of intravenous contrast.  CONTRAST:  100 mL of Omnipaque 300 IV contrast  COMPARISON:  CT of the abdomen and pelvis from 07/12/2011  FINDINGS: The visualized lung bases are clear.  There is diffuse fatty infiltration within the liver. The liver and spleen are otherwise unremarkable. The gallbladder is within normal limits. The pancreas and adrenal glands are unremarkable.  A 2.9 cm cyst is noted at the upper pole of the right kidney. Mild nonspecific perinephric stranding is noted bilaterally. A nonobstructing 1.0 cm stone is noted at the lower pole of the left kidney. The kidneys are otherwise unremarkable. There is no evidence of hydronephrosis. No obstructing ureteral stones are identified.  No free fluid is identified. The small bowel is unremarkable in appearance. The stomach is within normal limits.  No acute vascular abnormalities are seen. Mild calcification is noted along the abdominal aorta.  The appendix is normal in caliber, without evidence of appendicitis. The colon is  unremarkable in appearance.  The bladder is mildly distended and grossly unremarkable in appearance. The prostate is diminutive and grossly unremarkable. No inguinal lymphadenopathy is seen.  No acute osseous abnormalities are identified. Mild degenerative change is noted at the pubic symphysis. Multilevel vacuum phenomenon is noted along the lower thoracic and lumbar spine.  IMPRESSION: 1. No acute abnormality seen to explain the patient's symptoms. 2. Nonobstructing 1.0 cm stone at the lower pole of the left kidney. 3. No evidence of hydronephrosis. Small right renal cyst noted. 4. Diffuse fatty infiltration within the liver.   Electronically Signed   By: Garald Balding M.D.   On: 08/12/2014 01:48   Ct Foot Left W Contrast  08/14/2014   CLINICAL DATA:  Cellulitis left lower leg.  Venous stasis.  Sepsis.  EXAM: CT OF THE LOWER BILATERAL EXTREMITY WITH CONTRAST  TECHNIQUE: Multidetector CT imaging of the lower extremities, scanning side by side, was performed according to the standard protocol following intravenous contrast administration.  COMPARISON:  Radiographs 07/17/2013  CONTRAST:  155mL OMNIPAQUE IOHEXOL 300 MG/ML  SOLN  FINDINGS: There is marked subcutaneous edema throughout the left lower extremity consistent with the described history of cellulitis. There is no soft tissue gas. There is no drainable fluid collection. The bones are intact. There is no bone lesion or bony destruction.  There is no foreign body evident.  IMPRESSION: *Marked subcutaneous edema consistent with cellulitis, left lower extremity *No soft tissue gas. No foreign body. No drainable fluid collection. *No evidence of osteomyelitis.   Electronically Signed   By: Andreas Newport M.D.   On: 08/14/2014 02:46   Dg Chest Port 1 View  08/12/2014   CLINICAL DATA:  Acute onset of right-sided abdominal pain. Fever. Dysuria, nausea and vomiting. Initial encounter.  EXAM: PORTABLE CHEST - 1 VIEW  COMPARISON:  Chest radiograph performed  07/09/2013  FINDINGS: The lungs are well-aerated. Vascular congestion is noted. Increased interstitial markings may reflect mild interstitial edema or possibly pneumonia, given the patient's symptoms. There is no evidence of pleural effusion or pneumothorax.  The cardiomediastinal silhouette is within normal limits. No acute osseous abnormalities are seen.  IMPRESSION: Vascular congestion noted. Increased interstitial markings may reflect mild interstitial edema or possibly pneumonia, given the patient's symptoms.   Electronically Signed   By: Garald Balding M.D.   On: 08/12/2014 04:14   Ct Extrem Lower W Cm Bil  08/14/2014   CLINICAL DATA:  Cellulitis left lower leg.  Venous stasis.  Sepsis.  EXAM: CT OF THE LOWER BILATERAL EXTREMITY WITH CONTRAST  TECHNIQUE: Multidetector CT imaging of the lower extremities, scanning side by side, was performed according to the standard protocol following intravenous contrast administration.  COMPARISON:  Radiographs 07/17/2013  CONTRAST:  145mL OMNIPAQUE IOHEXOL 300 MG/ML  SOLN  FINDINGS: There is marked subcutaneous edema throughout the left lower extremity consistent with the described history of cellulitis. There is no soft tissue gas. There is no drainable fluid collection. The bones are intact. There is no bone lesion or bony destruction.  There is no foreign body evident.  IMPRESSION: *Marked subcutaneous edema consistent with cellulitis, left lower extremity *No soft tissue gas. No foreign body. No drainable fluid collection. *No evidence of osteomyelitis.   Electronically Signed   By: Andreas Newport M.D.   On: 08/14/2014 02:46  echo (titled as  ABI in epic) Left ventricle: The cavity size was normal. Wall thickness was increased in a pattern of mild LVH. Systolic function was normal. The estimated ejection fraction was in the range of 50% to 55%. Regional wall motion abnormalities cannot be excluded. Left ventricular diastolic function parameters were  normal. - Right atrium: The atrium was mildly dilated.    Subjective: No diarrhea, n/v/ abd pain. No new complaints. Wondering when he can go home.  Objective: Filed Vitals:   08/16/14 2023 08/16/14 2204 08/17/14 0500 08/17/14 0857  BP: 126/79  139/69   Pulse: 95  77 78  Temp: 98 F (36.7 C)  98.9 F (37.2 C)   TempSrc: Axillary  Oral   Resp: 21  20 20   Height:      Weight:   195.772 kg (431 lb 9.6 oz)   SpO2: 100% 99% 98% 97%    Intake/Output Summary (Last 24 hours) at 08/17/14 1515 Last data filed at 08/16/14 2147  Gross per 24 hour  Intake    674 ml  Output      0 ml  Net    674 ml   Weight change:  Exam:   General:  morbidly obese. Alert oriented nontoxic and comfortable.  HEENT: No thrush  Cardiovascular: RRR, S1/S2, no rubs, no gallops  Respiratory: diminished throughout without wheezes rhonchi or rales   Abdomen: Obese, soft, nontender. Bowel sounds present  Extremities: slight improvement in edema and intense erythema, but still quite severe, and weeping from blister. Erythema retreating somewhat. Diffuse blistering noted in the posterior leg area  Data Reviewed: Basic Metabolic Panel:  Recent Labs Lab 08/12/14 0705 08/13/14 0551 08/14/14 0605 08/15/14 0617 08/17/14 0540  NA 133* 131* 134* 134* 136  K 3.7 3.5 2.8* 3.7 3.5  CL 100* 96* 102 100* 99*  CO2 23 22 23 23 29   GLUCOSE 108* 77 82 101* 117*  BUN 10 10 6 6  >5*  CREATININE 0.83 0.83 0.76 0.63 0.69  CALCIUM 8.4* 7.7* 7.8* 8.1* 8.4*   Liver Function Tests:  Recent Labs Lab 08/11/14 2355 08/12/14 0705  AST 36 31  ALT 31 25  ALKPHOS 56 51  BILITOT 0.7 0.7  PROT 7.7 7.0  ALBUMIN 4.0 3.5    Recent Labs Lab 08/12/14 0030  LIPASE 30   No results for input(s): AMMONIA in the last 168 hours. CBC:  Recent Labs Lab 08/11/14 2355 08/12/14 0705 08/13/14 0551 08/14/14 0605 08/15/14 0617  WBC 16.2* 20.0* 13.6* 7.7 6.0  NEUTROABS 15.0* 19.3*  --   --   --   HGB 13.7 12.3*  12.3* 10.9* 11.7*  HCT 40.8 35.9* 35.9* 32.1* 34.7*  MCV 86.4 84.9 84.5 84.7 85.7  PLT 194 174 123* 118* 134*   Cardiac Enzymes:  Recent Labs Lab 08/13/14 0551  CKTOTAL 349   BNP: Invalid input(s): POCBNP CBG: No results for input(s): GLUCAP in the last 168 hours.  Recent Results (from the past 240 hour(s))  Urine culture     Status: None   Collection Time: 08/11/14 11:39 PM  Result Value Ref Range Status   Specimen Description URINE, CLEAN CATCH  Final   Special Requests NONE  Final   Colony Count NO GROWTH Performed at [facility]   Final   Culture NO GROWTH Performed at [facility]   Final   Report Status 08/13/2014 FINAL  Final  Culture, blood (routine x 2)     Status: None   Collection Time: 08/12/14 12:25 AM  Result Value Ref Range Status   Specimen Description BLOOD RIGHT ARM  Final   Special Requests BOTTLES DRAWN AEROBIC AND ANAEROBIC 4CC EACH  Final   Culture   Final    STREPTOCOCCUS GROUP G Note: Gram Stain Report Called to,Read Back By and Verified With: Minerva Fester RN [phone] Performed at [facility]    Report Status 08/15/2014 FINAL  Final   Organism ID, Bacteria STREPTOCOCCUS GROUP G  Final      Susceptibility   Streptococcus group g - MIC (ETEST)*    PENICILLIN .032 SENSITIVE Sensitive     * STREPTOCOCCUS GROUP G  Culture, blood (routine x 2)     Status: None   Collection Time: 08/12/14 12:29 AM  Result Value Ref Range Status   Specimen Description BLOOD RIGHT HAND  Final   Special Requests BOTTLES DRAWN AEROBIC AND ANAEROBIC 5CC EACH  Final   Culture   Final    STREPTOCOCCUS GROUP G Note: SUSCEPTIBILITIES PERFORMED ON PREVIOUS CULTURE WITHIN THE LAST 5 DAYS. Note: Gram Stain Report Called to,Read Back By and Verified With: Zenovia Jordan RN [phone] Performed at [facility]    Report Status 08/15/2014 FINAL  Final  Culture, blood (routine x 2)     Status: None (Preliminary result)   Collection Time: 08/14/14  5:50  AM  Result Value Ref Range Status   Specimen Description BLOOD LEFT HAND  Final   Special Requests BOTTLES DRAWN AEROBIC ONLY 5CC  Final   Culture   Final           BLOOD CULTURE RECEIVED NO GROWTH TO DATE CULTURE WILL BE HELD FOR 5 DAYS BEFORE ISSUING A FINAL NEGATIVE REPORT Performed at [facility]    Report Status PENDING  Incomplete  Culture, blood (routine x 2)     Status: None (Preliminary result)   Collection Time: 08/14/14  6:05 AM  Result Value Ref Range Status   Specimen Description BLOOD RIGHT HAND  Final   Special Requests BOTTLES DRAWN AEROBIC ONLY 3CC  Final   Culture   Final           BLOOD CULTURE RECEIVED NO GROWTH TO DATE CULTURE WILL BE HELD FOR 5 DAYS BEFORE ISSUING A FINAL NEGATIVE REPORT Performed at [facility]    Report Status PENDING  Incomplete  Clostridium Difficile by PCR     Status: Abnormal   Collection Time: 08/15/14  9:21 AM  Result Value Ref Range Status   C difficile by pcr POSITIVE (A) NEGATIVE Final    Comment: CRITICAL RESULT CALLED TO, READ BACK BY AND VERIFIED WITH: Freddi Starr RN 12:35 08/15/14 ([email or web address])      Scheduled Meds: . cefTRIAXone (ROCEPHIN)  IV  2 g Intravenous Q24H  . enoxaparin (LOVENOX) injection  100 mg Subcutaneous Daily  . furosemide  40 mg Intravenous BID  . guaiFENesin  600 mg Oral BID  . ipratropium-albuterol  3 mL Nebulization Q6H  . methadone  180 mg Oral Q breakfast  . metroNIDAZOLE  500 mg Oral 3 times per day  . nicotine  21 mg Transdermal Daily  . pneumococcal 23 valent vaccine  0.5 mL Intramuscular Tomorrow-1000  . potassium chloride  40 mEq Oral Daily  . sodium chloride  3 mL Intravenous Q12H   Continuous Infusions:    Delfina Redwood, MD [facility] Pager [phone]  [email or web address] Password [email or web address] 08/17/2014, 3:15 PM   LOS: 5 days

## 2014-08-17 NOTE — Consult Note (Signed)
WOC reviewed ABI results.  Pt unable to tolerate testing on the RLE due to pain/cellulits.  Would not wrap this leg now anyway due to the continued but improved cellulitis.  Orders placed for compression on the RLE.  Discussed with patient, will need HHRN to change Q Friday and to assess LLE for changes and application of Unna's boot once cellulitis has resolved.  Contacted bedside nurse to notify of need to page ortho to apply Louretta Parma' boot to the RLE.  [facility] team will follow along with you for continued needs with compression therapy and wound care.  Jorel Gravlin [place] RN,CWOCN [phone]

## 2014-08-17 NOTE — Progress Notes (Signed)
VASCULAR LAB PRELIMINARY  ARTERIAL  ABI completed:    RIGHT    LEFT    PRESSURE WAVEFORM  PRESSURE WAVEFORM  BRACHIAL   BRACHIAL 159 triphasic  DP   DP    AT 161 triphasic AT >165 triphasic  PT 159 triphasic PT 129 triphasic  PER   PER    GREAT TOE  NA GREAT TOE  NA    RIGHT LEFT  ABI >1.0 NA because the patient could not tolerate the blood pressure cuff tightening anymore     Guinevere Ferrari, RVT 08/17/2014, 1:14 PM

## 2014-08-18 DIAGNOSIS — E876 Hypokalemia: Secondary | ICD-10-CM | POA: Diagnosis not present

## 2014-08-18 DIAGNOSIS — L02619 Cutaneous abscess of unspecified foot: Secondary | ICD-10-CM | POA: Insufficient documentation

## 2014-08-18 DIAGNOSIS — L03119 Cellulitis of unspecified part of limb: Secondary | ICD-10-CM

## 2014-08-18 LAB — CBC
HEMATOCRIT: 31.9 % — AB (ref 39.0–52.0)
Hemoglobin: 10.6 g/dL — ABNORMAL LOW (ref 13.0–17.0)
MCH: 28 pg (ref 26.0–34.0)
MCHC: 33.2 g/dL (ref 30.0–36.0)
MCV: 84.2 fL (ref 78.0–100.0)
Platelets: 276 10*3/uL (ref 150–400)
RBC: 3.79 MIL/uL — ABNORMAL LOW (ref 4.22–5.81)
RDW: 14.4 % (ref 11.5–15.5)
WBC: 8 10*3/uL (ref 4.0–10.5)

## 2014-08-18 LAB — BASIC METABOLIC PANEL
ANION GAP: 9 (ref 5–15)
BUN: 5 mg/dL — AB (ref 6–20)
CALCIUM: 8.3 mg/dL — AB (ref 8.9–10.3)
CO2: 30 mmol/L (ref 22–32)
Chloride: 95 mmol/L — ABNORMAL LOW (ref 101–111)
Creatinine, Ser: 0.68 mg/dL (ref 0.61–1.24)
GFR calc Af Amer: >60 mL/min (ref >60)
GFR calc non Af Amer: >60 mL/min (ref >60)
GLUCOSE: 123 mg/dL — AB (ref 70–99)
POTASSIUM: 3.2 mmol/L — AB (ref 3.5–5.1)
SODIUM: 134 mmol/L — AB (ref 135–145)

## 2014-08-18 MED ORDER — POTASSIUM CHLORIDE CRYS ER 20 MEQ PO TBCR
40.0000 meq | EXTENDED_RELEASE_TABLET | Freq: Three times a day (TID) | ORAL | Status: AC
  Administered 2014-08-18 – 2014-08-19 (×6): 40 meq via ORAL
  Filled 2014-08-18 (×6): qty 2

## 2014-08-18 MED ORDER — DEXTROSE 5 % IV SOLN
2.0000 g | Freq: Two times a day (BID) | INTRAVENOUS | Status: DC
  Administered 2014-08-18 – 2014-08-20 (×5): 2 g via INTRAVENOUS
  Filled 2014-08-18 (×6): qty 2

## 2014-08-18 MED ORDER — SODIUM CHLORIDE 0.9 % IV SOLN
2000.0000 mg | Freq: Three times a day (TID) | INTRAVENOUS | Status: DC
  Administered 2014-08-18 – 2014-08-20 (×6): 2000 mg via INTRAVENOUS
  Filled 2014-08-18 (×9): qty 2000

## 2014-08-18 NOTE — Progress Notes (Signed)
Pt refusing neb tx, states will call if one needed. Pt assessed to PRN. RT will continue to monitor.

## 2014-08-18 NOTE — Progress Notes (Signed)
ANTIBIOTIC CONSULT NOTE  Pharmacy Consult for Vancomycin and cefepime Indication: cellulitis  No Known Allergies  Patient Measurements: Height: 6\' 1"  (185.4 cm) Weight: (!) 422 lb 12.8 oz (191.781 kg) IBW/kg (Calculated) : 79.9 Adjusted Body Weight: 130 kg  Vital Signs: Temp: 98.4 F (36.9 C) (05/07 0359) Temp Source: Oral (05/07 0359) BP: 121/60 mmHg (05/07 0359) Pulse Rate: 74 (05/07 0359) Intake/Output from previous day: 05/06 0701 - 05/07 0700 In: 480 [P.O.:480] Out: -  Intake/Output from this shift: Total I/O In: 360 [P.O.:360] Out: -   Labs:  Recent Labs  08/17/14 0540 08/18/14 0417  WBC  --  8.0  HGB  --  10.6*  PLT  --  276  CREATININE 0.69 0.68   Estimated Creatinine Clearance: 190.5 mL/min (by C-G formula based on Cr of 0.68). No results for input(s): VANCOTROUGH, VANCOPEAK, VANCORANDOM, GENTTROUGH, GENTPEAK, GENTRANDOM, TOBRATROUGH, TOBRAPEAK, TOBRARND, AMIKACINPEAK, AMIKACINTROU, AMIKACIN in the last 72 hours.   Microbiology: Recent Results (from the past 720 hour(s))  Urine culture     Status: None   Collection Time: 08/11/14 11:39 PM  Result Value Ref Range Status   Specimen Description URINE, CLEAN CATCH  Final   Special Requests NONE  Final   Colony Count NO GROWTH Performed at [facility]   Final   Culture NO GROWTH Performed at [facility]   Final   Report Status 08/13/2014 FINAL  Final  Culture, blood (routine x 2)     Status: None   Collection Time: 08/12/14 12:25 AM  Result Value Ref Range Status   Specimen Description BLOOD RIGHT ARM  Final   Special Requests BOTTLES DRAWN AEROBIC AND ANAEROBIC 4CC EACH  Final   Culture   Final    STREPTOCOCCUS GROUP G Note: Gram Stain Report Called to,Read Back By and Verified With: Minerva Fester RN [phone] Performed at [facility]    Report Status 08/15/2014 FINAL  Final   Organism ID, Bacteria STREPTOCOCCUS GROUP G  Final      Susceptibility   Streptococcus group g -  MIC (ETEST)*    PENICILLIN .032 SENSITIVE Sensitive     * STREPTOCOCCUS GROUP G  Culture, blood (routine x 2)     Status: None   Collection Time: 08/12/14 12:29 AM  Result Value Ref Range Status   Specimen Description BLOOD RIGHT HAND  Final   Special Requests BOTTLES DRAWN AEROBIC AND ANAEROBIC 5CC EACH  Final   Culture   Final    STREPTOCOCCUS GROUP G Note: SUSCEPTIBILITIES PERFORMED ON PREVIOUS CULTURE WITHIN THE LAST 5 DAYS. Note: Gram Stain Report Called to,Read Back By and Verified With: Zenovia Jordan RN [phone] Performed at [facility]    Report Status 08/15/2014 FINAL  Final  Culture, blood (routine x 2)     Status: None (Preliminary result)   Collection Time: 08/14/14  5:50 AM  Result Value Ref Range Status   Specimen Description BLOOD LEFT HAND  Final   Special Requests BOTTLES DRAWN AEROBIC ONLY 5CC  Final   Culture   Final           BLOOD CULTURE RECEIVED NO GROWTH TO DATE CULTURE WILL BE HELD FOR 5 DAYS BEFORE ISSUING A FINAL NEGATIVE REPORT Performed at [facility]    Report Status PENDING  Incomplete  Culture, blood (routine x 2)     Status: None (Preliminary result)   Collection Time: 08/14/14  6:05 AM  Result Value Ref Range Status   Specimen Description  BLOOD RIGHT HAND  Final   Special Requests BOTTLES DRAWN AEROBIC ONLY 3CC  Final   Culture   Final           BLOOD CULTURE RECEIVED NO GROWTH TO DATE CULTURE WILL BE HELD FOR 5 DAYS BEFORE ISSUING A FINAL NEGATIVE REPORT Performed at [facility]    Report Status PENDING  Incomplete  Clostridium Difficile by PCR     Status: Abnormal   Collection Time: 08/15/14  9:21 AM  Result Value Ref Range Status   C difficile by pcr POSITIVE (A) NEGATIVE Final    Comment: CRITICAL RESULT CALLED TO, READ BACK BY AND VERIFIED WITH: Freddi Starr RN 12:35 08/15/14 ([email or web address])     Medical History: Past Medical History  Diagnosis Date  . Venous insufficiency of leg   . Morbid obesity   . Venous stasis  dermatitis   . CHF (congestive heart failure)     Medications:  Prescriptions prior to admission  Medication Sig Dispense Refill Last Dose  . methadone (DOLOPHINE) 10 MG tablet Take 17 tablets (170 mg total) by mouth daily. (Patient taking differently: Take 180 mg by mouth daily. ) 17 tablet 0 08/11/2014 at Unknown time  . [DISCONTINUED] doxycycline (VIBRA-TABS) 100 MG tablet Take 1 tablet (100 mg total) by mouth 2 (two) times daily. (Patient not taking: Reported on 08/12/2014) 5 tablet 0 Completed Course at Unknown time  . [DISCONTINUED] furosemide (LASIX) 40 MG tablet Take 1 tablet (40 mg total) by mouth daily. (Patient not taking: Reported on 08/12/2014) 30 tablet 0 Not Taking at Unknown time  . [DISCONTINUED] potassium chloride 20 MEQ TBCR Take 20 mEq by mouth daily. (Patient not taking: Reported on 08/12/2014) 30 tablet 0 Not Taking at Unknown time  . [DISCONTINUED] sulfamethoxazole-trimethoprim (SEPTRA DS) 800-160 MG per tablet Take 1 tablet by mouth 2 (two) times daily. (Patient not taking: Reported on 08/12/2014) 24 tablet 0 Completed Course at Unknown time   Assessment: 54 y.o. male with fever, LE venous stasis, possible cellulitis.  Infectious Disease: Group G Strep bacteremia>Rocephin, abx now broadened back to vancomycin/cefepime, WBC has trended down to wnl, Tm 99.2. Now +Cdiff. Cellulitis is still extremely severe.  5/2 VT =11  Flagyl 5/5>> Vancomycin 5/1>5/4, 5/7>> Zosyn 5/1>5/2 Cefepime 5/7>>  Goal of Therapy:  Vancomycin trough 10-15  Plan:  Restart Vancomycin 2000mg  IV q8h Cefepime 2g q12 hours  Erin Hearing PharmD., BCPS Clinical Pharmacist Pager [phone] 08/18/2014 1:15 PM

## 2014-08-18 NOTE — Progress Notes (Signed)
PROGRESS NOTE  [name] CWC:[id number] DOB: 1960/07/01 DOA: 08/11/2014 PCP: No primary care provider on file.   Brief history 54 year old male with a history of COPD, CHF, morbid obesity, venous stasis of the lower extremities presented with 2 day history of increasing pain, edema, and erythema of his left lower extremity. The patient states that he hit his left leg knocking off a scab which she thinks may have been the cause of his cellulitis. At the time of admission, WBC was 16.2 with temperature 102.11F and lactic acid of 3.30. Venous duplex of his lower extremities was negative for DVT. In addition, the patient was also complaining of shortness of breath for the past 2 days with nonproductive cough. He had some nausea without vomiting. He also has some epigastric abdominal discomfort and left upper quadrant abdominal discomfort. He does have some constipation and has not had a bowel movement and nearly one week. Unfortunately, he continues to smoke 1 pack per day. He has over 100-pack-year history.  Assessment/Plan:  Sepsis Secondary to cellulitis and group G strep bacteremia PCN sensitive.  See below  Cellulitis of the left lower extremity  He had improved a bit on vancomycin, but improvement seems to have stalled on Rocephin. Still with intense erythema warmth induration and now skin starting to slough posteriorly.  Will change antibiotic to vancomycin and cefepime. Infection may be polymicrobial. Continue elevation and IV lasix. Would like patient to be seen by wound care again prior to discharge.  c diff pcr pos On flagyl. No diarrhea, but for some reason, stool sample was sent for C. difficile. Surprisingly, came back positive. Discussed with ID. Doubt clinically significant, but giving Flagyl prophylactically while on antibiotic. CT abd on admission without colitis  Thrombocytopenia;  Suspect related to infection. mild  Hypokalemia;  Increase po repletion  Acute  respiratory failure/hypoxemia  -Suspect he had 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. No clinical evidence of CHF. Echocardiogram with normal EF  Tobacco abuse/COPD  -NicoDerm patch  -Tobacco cessation discussed  -Continue aerosolized albuterol and Atrovent   Hyponatremia  Mild, improving  Chronic venous stasis dermatitis: Patient has an Haematologist on his right leg. He is complaining about this, as he has no insurance, primary care provider and concerned about where he will have Unna boots placed. Last week, I asked care management to set up follow-up at [facility]  History of polysubstance abuse -Continue home dose methadone   Family Communication: sons at bedside Disposition Plan: not stable for discharge. Anticipate several more days, but unclear if pt will agree    Procedures/Studies: Dg Chest 2 View  08/14/2014   CLINICAL DATA:  Bilateral leg swelling and short of breath  EXAM: CHEST  2 VIEW  COMPARISON:  Radiograph 08/12/2014  FINDINGS: Stable enlarged cardiac silhouette. There is increased central venous congestion and mild interstitial edema. No pleural fluid. No focal consolidation. No pneumothorax.  IMPRESSION: Increased central venous congestion and interstitial edema.   Electronically Signed   By: Suzy Bouchard M.D.   On: 08/14/2014 01:57   Ct Abdomen Pelvis W Contrast  08/12/2014   CLINICAL DATA:  Acute onset of generalized abdominal pain and back pain. Dysuria. Nausea and vomiting. Initial encounter.  EXAM: CT ABDOMEN AND PELVIS WITH CONTRAST  TECHNIQUE: Multidetector CT imaging of the abdomen and pelvis was performed using the standard protocol following bolus administration of intravenous contrast.  CONTRAST:  100 mL of Omnipaque 300 IV contrast  COMPARISON:  CT of the abdomen and pelvis from 07/12/2011  FINDINGS: The visualized lung bases are clear.  There is diffuse fatty infiltration within the liver. The liver and spleen are otherwise unremarkable. The  gallbladder is within normal limits. The pancreas and adrenal glands are unremarkable.  A 2.9 cm cyst is noted at the upper pole of the right kidney. Mild nonspecific perinephric stranding is noted bilaterally. A nonobstructing 1.0 cm stone is noted at the lower pole of the left kidney. The kidneys are otherwise unremarkable. There is no evidence of hydronephrosis. No obstructing ureteral stones are identified.  No free fluid is identified. The small bowel is unremarkable in appearance. The stomach is within normal limits. No acute vascular abnormalities are seen. Mild calcification is noted along the abdominal aorta.  The appendix is normal in caliber, without evidence of appendicitis. The colon is unremarkable in appearance.  The bladder is mildly distended and grossly unremarkable in appearance. The prostate is diminutive and grossly unremarkable. No inguinal lymphadenopathy is seen.  No acute osseous abnormalities are identified. Mild degenerative change is noted at the pubic symphysis. Multilevel vacuum phenomenon is noted along the lower thoracic and lumbar spine.  IMPRESSION: 1. No acute abnormality seen to explain the patient's symptoms. 2. Nonobstructing 1.0 cm stone at the lower pole of the left kidney. 3. No evidence of hydronephrosis. Small right renal cyst noted. 4. Diffuse fatty infiltration within the liver.   Electronically Signed   By: Garald Balding M.D.   On: 08/12/2014 01:48   Ct Foot Left W Contrast  08/14/2014   CLINICAL DATA:  Cellulitis left lower leg.  Venous stasis.  Sepsis.  EXAM: CT OF THE LOWER BILATERAL EXTREMITY WITH CONTRAST  TECHNIQUE: Multidetector CT imaging of the lower extremities, scanning side by side, was performed according to the standard protocol following intravenous contrast administration.  COMPARISON:  Radiographs 07/17/2013  CONTRAST:  180mL OMNIPAQUE IOHEXOL 300 MG/ML  SOLN  FINDINGS: There is marked subcutaneous edema throughout the left lower extremity consistent  with the described history of cellulitis. There is no soft tissue gas. There is no drainable fluid collection. The bones are intact. There is no bone lesion or bony destruction.  There is no foreign body evident.  IMPRESSION: *Marked subcutaneous edema consistent with cellulitis, left lower extremity *No soft tissue gas. No foreign body. No drainable fluid collection. *No evidence of osteomyelitis.   Electronically Signed   By: Andreas Newport M.D.   On: 08/14/2014 02:46   Dg Chest Port 1 View  08/12/2014   CLINICAL DATA:  Acute onset of right-sided abdominal pain. Fever. Dysuria, nausea and vomiting. Initial encounter.  EXAM: PORTABLE CHEST - 1 VIEW  COMPARISON:  Chest radiograph performed 07/09/2013  FINDINGS: The lungs are well-aerated. Vascular congestion is noted. Increased interstitial markings may reflect mild interstitial edema or possibly pneumonia, given the patient's symptoms. There is no evidence of pleural effusion or pneumothorax.  The cardiomediastinal silhouette is within normal limits. No acute osseous abnormalities are seen.  IMPRESSION: Vascular congestion noted. Increased interstitial markings may reflect mild interstitial edema or possibly pneumonia, given the patient's symptoms.   Electronically Signed   By: Garald Balding M.D.   On: 08/12/2014 04:14   Ct Extrem Lower W Cm Bil  08/14/2014   CLINICAL DATA:  Cellulitis left lower leg.  Venous stasis.  Sepsis.  EXAM: CT OF THE LOWER BILATERAL EXTREMITY WITH CONTRAST  TECHNIQUE: Multidetector CT imaging of the lower extremities, scanning side by side, was performed according to  the standard protocol following intravenous contrast administration.  COMPARISON:  Radiographs 07/17/2013  CONTRAST:  123mL OMNIPAQUE IOHEXOL 300 MG/ML  SOLN  FINDINGS: There is marked subcutaneous edema throughout the left lower extremity consistent with the described history of cellulitis. There is no soft tissue gas. There is no drainable fluid collection. The bones  are intact. There is no bone lesion or bony destruction.  There is no foreign body evident.  IMPRESSION: *Marked subcutaneous edema consistent with cellulitis, left lower extremity *No soft tissue gas. No foreign body. No drainable fluid collection. *No evidence of osteomyelitis.   Electronically Signed   By: Andreas Newport M.D.   On: 08/14/2014 02:46  echo (titled as ABI in epic) Left ventricle: The cavity size was normal. Wall thickness was increased in a pattern of mild LVH. Systolic function was normal. The estimated ejection fraction was in the range of 50% to 55%. Regional wall motion abnormalities cannot be excluded. Left ventricular diastolic function parameters were normal. - Right atrium: The atrium was mildly dilated.    Subjective: Wants to go home. Denies nausea vomiting diarrhea. Agrees to stay for today.  Objective: Filed Vitals:   08/17/14 2002 08/17/14 2035 08/18/14 0359 08/18/14 1500  BP:  138/65 121/60 118/54  Pulse:  81 74 75  Temp:  99.2 F (37.3 C) 98.4 F (36.9 C) 98.8 F (37.1 C)  TempSrc:  Oral Oral   Resp:  20 18 20   Height:      Weight:   191.781 kg (422 lb 12.8 oz)   SpO2: 96% 92% 93% 95%    Intake/Output Summary (Last 24 hours) at 08/18/14 1548 Last data filed at 08/18/14 1230  Gross per 24 hour  Intake    960 ml  Output      0 ml  Net    960 ml   Weight change: -3.992 kg (-8 lb 12.8 oz) Exam:   General:  morbidly obese. Alert oriented nontoxic and comfortable.  HEENT: No thrush  Cardiovascular: RRR, S1/S2, no rubs, no gallops  Respiratory: diminished throughout without wheezes rhonchi or rales   Abdomen: Obese, soft, nontender. Bowel sounds present  Extremities: Right leg in an Unna boot. Left leg still with intense erythema and induration warmth from the knee to ankle. Slightly less swollen. Posterior aspect starting to blister and slough off. Anterior blister draining serous fluid.  Data Reviewed: Basic Metabolic  Panel:  Recent Labs Lab 08/13/14 0551 08/14/14 0605 08/15/14 0617 08/17/14 0540 08/18/14 0417  NA 131* 134* 134* 136 134*  K 3.5 2.8* 3.7 3.5 3.2*  CL 96* 102 100* 99* 95*  CO2 22 23 23 29 30   GLUCOSE 77 82 101* 117* 123*  BUN 10 6 6  >5* 5*  CREATININE 0.83 0.76 0.63 0.69 0.68  CALCIUM 7.7* 7.8* 8.1* 8.4* 8.3*   Liver Function Tests:  Recent Labs Lab 08/11/14 2355 08/12/14 0705  AST 36 31  ALT 31 25  ALKPHOS 56 51  BILITOT 0.7 0.7  PROT 7.7 7.0  ALBUMIN 4.0 3.5    Recent Labs Lab 08/12/14 0030  LIPASE 30   No results for input(s): AMMONIA in the last 168 hours. CBC:  Recent Labs Lab 08/11/14 2355 08/12/14 0705 08/13/14 0551 08/14/14 0605 08/15/14 0617 08/18/14 0417  WBC 16.2* 20.0* 13.6* 7.7 6.0 8.0  NEUTROABS 15.0* 19.3*  --   --   --   --   HGB 13.7 12.3* 12.3* 10.9* 11.7* 10.6*  HCT 40.8 35.9* 35.9* 32.1* 34.7* 31.9*  MCV 86.4 84.9 84.5 84.7 85.7 84.2  PLT 194 174 123* 118* 134* 276   Cardiac Enzymes:  Recent Labs Lab 08/13/14 0551  CKTOTAL 349   BNP: Invalid input(s): POCBNP CBG: No results for input(s): GLUCAP in the last 168 hours.  Recent Results (from the past 240 hour(s))  Urine culture     Status: None   Collection Time: 08/11/14 11:39 PM  Result Value Ref Range Status   Specimen Description URINE, CLEAN CATCH  Final   Special Requests NONE  Final   Colony Count NO GROWTH Performed at [facility]   Final   Culture NO GROWTH Performed at [facility]   Final   Report Status 08/13/2014 FINAL  Final  Culture, blood (routine x 2)     Status: None   Collection Time: 08/12/14 12:25 AM  Result Value Ref Range Status   Specimen Description BLOOD RIGHT ARM  Final   Special Requests BOTTLES DRAWN AEROBIC AND ANAEROBIC 4CC EACH  Final   Culture   Final    STREPTOCOCCUS GROUP G Note: Gram Stain Report Called to,Read Back By and Verified With: Minerva Fester RN [phone] Performed at [facility]    Report Status  08/15/2014 FINAL  Final   Organism ID, Bacteria STREPTOCOCCUS GROUP G  Final      Susceptibility   Streptococcus group g - MIC (ETEST)*    PENICILLIN .032 SENSITIVE Sensitive     * STREPTOCOCCUS GROUP G  Culture, blood (routine x 2)     Status: None   Collection Time: 08/12/14 12:29 AM  Result Value Ref Range Status   Specimen Description BLOOD RIGHT HAND  Final   Special Requests BOTTLES DRAWN AEROBIC AND ANAEROBIC 5CC EACH  Final   Culture   Final    STREPTOCOCCUS GROUP G Note: SUSCEPTIBILITIES PERFORMED ON PREVIOUS CULTURE WITHIN THE LAST 5 DAYS. Note: Gram Stain Report Called to,Read Back By and Verified With: Zenovia Jordan RN [phone] Performed at [facility]    Report Status 08/15/2014 FINAL  Final  Culture, blood (routine x 2)     Status: None (Preliminary result)   Collection Time: 08/14/14  5:50 AM  Result Value Ref Range Status   Specimen Description BLOOD LEFT HAND  Final   Special Requests BOTTLES DRAWN AEROBIC ONLY 5CC  Final   Culture   Final           BLOOD CULTURE RECEIVED NO GROWTH TO DATE CULTURE WILL BE HELD FOR 5 DAYS BEFORE ISSUING A FINAL NEGATIVE REPORT Performed at [facility]    Report Status PENDING  Incomplete  Culture, blood (routine x 2)     Status: None (Preliminary result)   Collection Time: 08/14/14  6:05 AM  Result Value Ref Range Status   Specimen Description BLOOD RIGHT HAND  Final   Special Requests BOTTLES DRAWN AEROBIC ONLY 3CC  Final   Culture   Final           BLOOD CULTURE RECEIVED NO GROWTH TO DATE CULTURE WILL BE HELD FOR 5 DAYS BEFORE ISSUING A FINAL NEGATIVE REPORT Performed at [facility]    Report Status PENDING  Incomplete  Clostridium Difficile by PCR     Status: Abnormal   Collection Time: 08/15/14  9:21 AM  Result Value Ref Range Status   C difficile by pcr POSITIVE (A) NEGATIVE Final    Comment: CRITICAL RESULT CALLED TO, READ BACK BY AND VERIFIED WITH: Freddi Starr RN 12:35  08/15/14 ([email or web address])       Scheduled Meds: . ceFEPime (MAXIPIME) IV  2 g Intravenous Q12H  . enoxaparin (LOVENOX) injection  100 mg Subcutaneous Daily  . furosemide  40 mg Intravenous BID  . guaiFENesin  600 mg Oral BID  . methadone  180 mg Oral Q breakfast  . metroNIDAZOLE  500 mg Oral 3 times per day  . nicotine  21 mg Transdermal Daily  . pneumococcal 23 valent vaccine  0.5 mL Intramuscular Tomorrow-1000  . potassium chloride  40 mEq Oral TID  . sodium chloride  3 mL Intravenous Q12H  . vancomycin  2,000 mg Intravenous Q8H   Continuous Infusions:    Delfina Redwood, MD [facility] Pager [phone]  [email or web address] Password [email or web address] 08/18/2014, 3:48 PM   LOS: 6 days

## 2014-08-19 LAB — BASIC METABOLIC PANEL
Anion gap: 7 (ref 5–15)
BUN: 7 mg/dL (ref 6–20)
CALCIUM: 8.6 mg/dL — AB (ref 8.9–10.3)
CO2: 29 mmol/L (ref 22–32)
CREATININE: 0.71 mg/dL (ref 0.61–1.24)
Chloride: 98 mmol/L — ABNORMAL LOW (ref 101–111)
GFR calc Af Amer: >60 mL/min (ref >60)
Glucose, Bld: 127 mg/dL — ABNORMAL HIGH (ref 70–99)
Potassium: 3.9 mmol/L (ref 3.5–5.1)
SODIUM: 134 mmol/L — AB (ref 135–145)

## 2014-08-19 LAB — VANCOMYCIN, TROUGH: Vancomycin Tr: 13 ug/mL (ref 10.0–20.0)

## 2014-08-19 MED ORDER — ALTEPLASE 2 MG IJ SOLR
2.0000 mg | Freq: Once | INTRAMUSCULAR | Status: AC
  Administered 2014-08-19: 2 mg
  Filled 2014-08-19: qty 2

## 2014-08-19 NOTE — Progress Notes (Signed)
PROGRESS NOTE  Shawna Kiener URK:[id number] DOB: 07-12-1960 DOA: 08/11/2014 PCP: No primary care provider on file.   Brief history 54 year old male with a history of COPD, CHF, morbid obesity, venous stasis of the lower extremities presented with 2 day history of increasing pain, edema, and erythema of his left lower extremity. The patient states that he hit his left leg knocking off a scab which she thinks may have been the cause of his cellulitis. At the time of admission, WBC was 16.2 with temperature 102.28F and lactic acid of 3.30. Venous duplex of his lower extremities was negative for DVT. In addition, the patient was also complaining of shortness of breath for the past 2 days with nonproductive cough. He had some nausea without vomiting. He also has some epigastric abdominal discomfort and left upper quadrant abdominal discomfort. He does have some constipation and has not had a bowel movement and nearly one week. Unfortunately, he continues to smoke 1 pack per day. He has over 100-pack-year history.  Assessment/Plan:  Sepsis Secondary to cellulitis and group G strep bacteremia PCN sensitive.  See below  Cellulitis of the left lower extremity  He had improved a bit on vancomycin, but improvement seems to have stalled on Rocephin. Still with intense erythema warmth induration and now skin starting to slough posteriorly.  Will change antibiotic to vancomycin and cefepime. Infection may be polymicrobial. Continue elevation and IV lasix. Would like patient to be seen by wound care again prior to discharge.  c diff pcr pos On flagyl. No diarrhea, but for some reason, stool sample was sent for C. difficile. Surprisingly, came back positive. Discussed with ID. Doubt clinically significant, but giving Flagyl prophylactically while on antibiotic. CT abd on admission without colitis  Thrombocytopenia;  Suspect related to infection. mild  Hypokalemia;  Increase po repletion  Acute  respiratory failure/hypoxemia  -Suspect he had 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. No clinical evidence of CHF. Echocardiogram with normal EF  Tobacco abuse/COPD  -NicoDerm patch  -Tobacco cessation discussed  -Continue aerosolized albuterol and Atrovent   Hyponatremia  Mild, improving  Chronic venous stasis dermatitis: Patient has an Haematologist on his right leg. He is complaining about this, as he has no insurance, primary care provider and concerned about where he will have Unna boots placed. Last week, I asked care management to set up follow-up at [facility]  History of polysubstance abuse -Continue home dose methadone   Family Communication: patient Disposition Plan: several more days of IV abx    Procedures/Studies: Dg Chest 2 View  08/14/2014   CLINICAL DATA:  Bilateral leg swelling and short of breath  EXAM: CHEST  2 VIEW  COMPARISON:  Radiograph 08/12/2014  FINDINGS: Stable enlarged cardiac silhouette. There is increased central venous congestion and mild interstitial edema. No pleural fluid. No focal consolidation. No pneumothorax.  IMPRESSION: Increased central venous congestion and interstitial edema.   Electronically Signed   By: Suzy Bouchard M.D.   On: 08/14/2014 01:57   Ct Abdomen Pelvis W Contrast  08/12/2014   CLINICAL DATA:  Acute onset of generalized abdominal pain and back pain. Dysuria. Nausea and vomiting. Initial encounter.  EXAM: CT ABDOMEN AND PELVIS WITH CONTRAST  TECHNIQUE: Multidetector CT imaging of the abdomen and pelvis was performed using the standard protocol following bolus administration of intravenous contrast.  CONTRAST:  100 mL of Omnipaque 300 IV contrast  COMPARISON:  CT of the abdomen and pelvis from 07/12/2011  FINDINGS:  The visualized lung bases are clear.  There is diffuse fatty infiltration within the liver. The liver and spleen are otherwise unremarkable. The gallbladder is within normal limits. The pancreas and adrenal  glands are unremarkable.  A 2.9 cm cyst is noted at the upper pole of the right kidney. Mild nonspecific perinephric stranding is noted bilaterally. A nonobstructing 1.0 cm stone is noted at the lower pole of the left kidney. The kidneys are otherwise unremarkable. There is no evidence of hydronephrosis. No obstructing ureteral stones are identified.  No free fluid is identified. The small bowel is unremarkable in appearance. The stomach is within normal limits. No acute vascular abnormalities are seen. Mild calcification is noted along the abdominal aorta.  The appendix is normal in caliber, without evidence of appendicitis. The colon is unremarkable in appearance.  The bladder is mildly distended and grossly unremarkable in appearance. The prostate is diminutive and grossly unremarkable. No inguinal lymphadenopathy is seen.  No acute osseous abnormalities are identified. Mild degenerative change is noted at the pubic symphysis. Multilevel vacuum phenomenon is noted along the lower thoracic and lumbar spine.  IMPRESSION: 1. No acute abnormality seen to explain the patient's symptoms. 2. Nonobstructing 1.0 cm stone at the lower pole of the left kidney. 3. No evidence of hydronephrosis. Small right renal cyst noted. 4. Diffuse fatty infiltration within the liver.   Electronically Signed   By: Garald Balding M.D.   On: 08/12/2014 01:48   Ct Foot Left W Contrast  08/14/2014   CLINICAL DATA:  Cellulitis left lower leg.  Venous stasis.  Sepsis.  EXAM: CT OF THE LOWER BILATERAL EXTREMITY WITH CONTRAST  TECHNIQUE: Multidetector CT imaging of the lower extremities, scanning side by side, was performed according to the standard protocol following intravenous contrast administration.  COMPARISON:  Radiographs 07/17/2013  CONTRAST:  110mL OMNIPAQUE IOHEXOL 300 MG/ML  SOLN  FINDINGS: There is marked subcutaneous edema throughout the left lower extremity consistent with the described history of cellulitis. There is no soft  tissue gas. There is no drainable fluid collection. The bones are intact. There is no bone lesion or bony destruction.  There is no foreign body evident.  IMPRESSION: *Marked subcutaneous edema consistent with cellulitis, left lower extremity *No soft tissue gas. No foreign body. No drainable fluid collection. *No evidence of osteomyelitis.   Electronically Signed   By: Andreas Newport M.D.   On: 08/14/2014 02:46   Dg Chest Port 1 View  08/12/2014   CLINICAL DATA:  Acute onset of right-sided abdominal pain. Fever. Dysuria, nausea and vomiting. Initial encounter.  EXAM: PORTABLE CHEST - 1 VIEW  COMPARISON:  Chest radiograph performed 07/09/2013  FINDINGS: The lungs are well-aerated. Vascular congestion is noted. Increased interstitial markings may reflect mild interstitial edema or possibly pneumonia, given the patient's symptoms. There is no evidence of pleural effusion or pneumothorax.  The cardiomediastinal silhouette is within normal limits. No acute osseous abnormalities are seen.  IMPRESSION: Vascular congestion noted. Increased interstitial markings may reflect mild interstitial edema or possibly pneumonia, given the patient's symptoms.   Electronically Signed   By: Garald Balding M.D.   On: 08/12/2014 04:14   Ct Extrem Lower W Cm Bil  08/14/2014   CLINICAL DATA:  Cellulitis left lower leg.  Venous stasis.  Sepsis.  EXAM: CT OF THE LOWER BILATERAL EXTREMITY WITH CONTRAST  TECHNIQUE: Multidetector CT imaging of the lower extremities, scanning side by side, was performed according to the standard protocol following intravenous contrast administration.  COMPARISON:  Radiographs 07/17/2013  CONTRAST:  123mL OMNIPAQUE IOHEXOL 300 MG/ML  SOLN  FINDINGS: There is marked subcutaneous edema throughout the left lower extremity consistent with the described history of cellulitis. There is no soft tissue gas. There is no drainable fluid collection. The bones are intact. There is no bone lesion or bony destruction.   There is no foreign body evident.  IMPRESSION: *Marked subcutaneous edema consistent with cellulitis, left lower extremity *No soft tissue gas. No foreign body. No drainable fluid collection. *No evidence of osteomyelitis.   Electronically Signed   By: Andreas Newport M.D.   On: 08/14/2014 02:46  echo (titled as ABI in epic) Left ventricle: The cavity size was normal. Wall thickness was increased in a pattern of mild LVH. Systolic function was normal. The estimated ejection fraction was in the range of 50% to 55%. Regional wall motion abnormalities cannot be excluded. Left ventricular diastolic function parameters were normal. - Right atrium: The atrium was mildly dilated.    Subjective: Wants to go home. Patient states he is much improved  Objective: Filed Vitals:   08/18/14 1849 08/18/14 2005 08/18/14 2105 08/19/14 0553  BP: 120/56 125/74 116/67 135/72  Pulse: 72 90 90 74  Temp: 98.9 F (37.2 C) 97.8 F (36.6 C) 99.7 F (37.6 C) 98.2 F (36.8 C)  TempSrc:  Oral Oral Oral  Resp: 18 16 18 18   Height:      Weight:      SpO2: 99% 100% 93% 93%    Intake/Output Summary (Last 24 hours) at 08/19/14 1109 Last data filed at 08/18/14 1230  Gross per 24 hour  Intake    360 ml  Output      0 ml  Net    360 ml   Weight change:  Exam:   General:  morbidly obese. Alert oriented nontoxic and comfortable.  Cardiovascular: RRR, S1/S2, no rubs, no gallops  Respiratory: diminished throughout without wheezes rhonchi or rales   Abdomen: Obese, soft, nontender. Bowel sounds present  Extremities: Right leg in an Unna boot. Left leg still with intense erythema and induration warmth from the knee to ankle. swollen. Multiple blisters  Data Reviewed: Basic Metabolic Panel:  Recent Labs Lab 08/14/14 0605 08/15/14 0617 08/17/14 0540 08/18/14 0417 08/19/14 0545  NA 134* 134* 136 134* 134*  K 2.8* 3.7 3.5 3.2* 3.9  CL 102 100* 99* 95* 98*  CO2 23 23 29 30 29   GLUCOSE 82  101* 117* 123* 127*  BUN 6 6 >5* 5* 7  CREATININE 0.76 0.63 0.69 0.68 0.71  CALCIUM 7.8* 8.1* 8.4* 8.3* 8.6*   Liver Function Tests: No results for input(s): AST, ALT, ALKPHOS, BILITOT, PROT, ALBUMIN in the last 168 hours. No results for input(s): LIPASE, AMYLASE in the last 168 hours. No results for input(s): AMMONIA in the last 168 hours. CBC:  Recent Labs Lab 08/13/14 0551 08/14/14 0605 08/15/14 0617 08/18/14 0417  WBC 13.6* 7.7 6.0 8.0  HGB 12.3* 10.9* 11.7* 10.6*  HCT 35.9* 32.1* 34.7* 31.9*  MCV 84.5 84.7 85.7 84.2  PLT 123* 118* 134* 276   Cardiac Enzymes:  Recent Labs Lab 08/13/14 0551  CKTOTAL 349   BNP: Invalid input(s): POCBNP CBG: No results for input(s): GLUCAP in the last 168 hours.  Recent Results (from the past 240 hour(s))  Urine culture     Status: None   Collection Time: 08/11/14 11:39 PM  Result Value Ref Range Status   Specimen Description URINE, CLEAN CATCH  Final  Special Requests NONE  Final   Colony Count NO GROWTH Performed at [facility]   Final   Culture NO GROWTH Performed at [facility]   Final   Report Status 08/13/2014 FINAL  Final  Culture, blood (routine x 2)     Status: None   Collection Time: 08/12/14 12:25 AM  Result Value Ref Range Status   Specimen Description BLOOD RIGHT ARM  Final   Special Requests BOTTLES DRAWN AEROBIC AND ANAEROBIC 4CC EACH  Final   Culture   Final    STREPTOCOCCUS GROUP G Note: Gram Stain Report Called to,Read Back By and Verified With: Minerva Fester RN [phone] Performed at [facility]    Report Status 08/15/2014 FINAL  Final   Organism ID, Bacteria STREPTOCOCCUS GROUP G  Final      Susceptibility   Streptococcus group g - MIC (ETEST)*    PENICILLIN .032 SENSITIVE Sensitive     * STREPTOCOCCUS GROUP G  Culture, blood (routine x 2)     Status: None   Collection Time: 08/12/14 12:29 AM  Result Value Ref Range Status   Specimen Description BLOOD RIGHT HAND  Final    Special Requests BOTTLES DRAWN AEROBIC AND ANAEROBIC 5CC EACH  Final   Culture   Final    STREPTOCOCCUS GROUP G Note: SUSCEPTIBILITIES PERFORMED ON PREVIOUS CULTURE WITHIN THE LAST 5 DAYS. Note: Gram Stain Report Called to,Read Back By and Verified With: Zenovia Jordan RN [phone] Performed at [facility]    Report Status 08/15/2014 FINAL  Final  Culture, blood (routine x 2)     Status: None (Preliminary result)   Collection Time: 08/14/14  5:50 AM  Result Value Ref Range Status   Specimen Description BLOOD LEFT HAND  Final   Special Requests BOTTLES DRAWN AEROBIC ONLY 5CC  Final   Culture   Final           BLOOD CULTURE RECEIVED NO GROWTH TO DATE CULTURE WILL BE HELD FOR 5 DAYS BEFORE ISSUING A FINAL NEGATIVE REPORT Performed at [facility]    Report Status PENDING  Incomplete  Culture, blood (routine x 2)     Status: None (Preliminary result)   Collection Time: 08/14/14  6:05 AM  Result Value Ref Range Status   Specimen Description BLOOD RIGHT HAND  Final   Special Requests BOTTLES DRAWN AEROBIC ONLY 3CC  Final   Culture   Final           BLOOD CULTURE RECEIVED NO GROWTH TO DATE CULTURE WILL BE HELD FOR 5 DAYS BEFORE ISSUING A FINAL NEGATIVE REPORT Performed at [facility]    Report Status PENDING  Incomplete  Clostridium Difficile by PCR     Status: Abnormal   Collection Time: 08/15/14  9:21 AM  Result Value Ref Range Status   C difficile by pcr POSITIVE (A) NEGATIVE Final    Comment: CRITICAL RESULT CALLED TO, READ BACK BY AND VERIFIED WITH: Freddi Starr RN 12:35 08/15/14 ([email or web address])      Scheduled Meds: . ceFEPime (MAXIPIME) IV  2 g Intravenous Q12H  . enoxaparin (LOVENOX) injection  100 mg Subcutaneous Daily  . furosemide  40 mg Intravenous BID  . guaiFENesin  600 mg Oral BID  . methadone  180 mg Oral Q breakfast  . metroNIDAZOLE  500 mg Oral 3 times per day  . nicotine  21 mg Transdermal Daily  . pneumococcal 23 valent vaccine  0.5 mL Intramuscular  Tomorrow-1000  .  potassium chloride  40 mEq Oral TID  . sodium chloride  3 mL Intravenous Q12H  . vancomycin  2,000 mg Intravenous Q8H   Continuous Infusions:    Eulogio Bear, DO [facility] Pager [phone]  [email or web address] Password [email or web address] 08/19/2014, 11:09 AM   LOS: 7 days

## 2014-08-19 NOTE — Progress Notes (Signed)
ANTIBIOTIC CONSULT NOTE  Pharmacy Consult for Vancomycin and cefepime Indication: cellulitis  No Known Allergies  Patient Measurements: Height: 6\' 1"  (185.4 cm) Weight: (!) 422 lb 12.8 oz (191.781 kg) IBW/kg (Calculated) : 79.9 Adjusted Body Weight: 130 kg  Vital Signs: Temp: 98.7 F (37.1 C) (05/08 1400) BP: 116/62 mmHg (05/08 1400) Pulse Rate: 72 (05/08 1400) Intake/Output from previous day: 05/07 0701 - 05/08 0700 In: 720 [P.O.:720] Out: -  Intake/Output from this shift:    Labs:  Recent Labs  08/17/14 0540 08/18/14 0417 08/19/14 0545  WBC  --  8.0  --   HGB  --  10.6*  --   PLT  --  276  --   CREATININE 0.69 0.68 0.71   Estimated Creatinine Clearance: 190.5 mL/min (by C-G formula based on Cr of 0.71).  Recent Labs  08/19/14 1830  [place] 13     Microbiology: Recent Results (from the past 720 hour(s))  Urine culture     Status: None   Collection Time: 08/11/14 11:39 PM  Result Value Ref Range Status   Specimen Description URINE, CLEAN CATCH  Final   Special Requests NONE  Final   Colony Count NO GROWTH Performed at [facility]   Final   Culture NO GROWTH Performed at [facility]   Final   Report Status 08/13/2014 FINAL  Final  Culture, blood (routine x 2)     Status: None   Collection Time: 08/12/14 12:25 AM  Result Value Ref Range Status   Specimen Description BLOOD RIGHT ARM  Final   Special Requests BOTTLES DRAWN AEROBIC AND ANAEROBIC 4CC EACH  Final   Culture   Final    STREPTOCOCCUS GROUP G Note: Gram Stain Report Called to,Read Back By and Verified With: Minerva Fester RN [phone] Performed at [facility]    Report Status 08/15/2014 FINAL  Final   Organism ID, Bacteria STREPTOCOCCUS GROUP G  Final      Susceptibility   Streptococcus group g - MIC (ETEST)*    PENICILLIN .032 SENSITIVE Sensitive     * STREPTOCOCCUS GROUP G  Culture, blood (routine x 2)     Status: None   Collection Time: 08/12/14 12:29 AM   Result Value Ref Range Status   Specimen Description BLOOD RIGHT HAND  Final   Special Requests BOTTLES DRAWN AEROBIC AND ANAEROBIC 5CC EACH  Final   Culture   Final    STREPTOCOCCUS GROUP G Note: SUSCEPTIBILITIES PERFORMED ON PREVIOUS CULTURE WITHIN THE LAST 5 DAYS. Note: Gram Stain Report Called to,Read Back By and Verified With: Zenovia Jordan RN [phone] Performed at [facility]    Report Status 08/15/2014 FINAL  Final  Culture, blood (routine x 2)     Status: None (Preliminary result)   Collection Time: 08/14/14  5:50 AM  Result Value Ref Range Status   Specimen Description BLOOD LEFT HAND  Final   Special Requests BOTTLES DRAWN AEROBIC ONLY 5CC  Final   Culture   Final           BLOOD CULTURE RECEIVED NO GROWTH TO DATE CULTURE WILL BE HELD FOR 5 DAYS BEFORE ISSUING A FINAL NEGATIVE REPORT Performed at [facility]    Report Status PENDING  Incomplete  Culture, blood (routine x 2)     Status: None (Preliminary result)   Collection Time: 08/14/14  6:05 AM  Result Value Ref Range Status   Specimen Description BLOOD RIGHT HAND  Final   Special Requests  BOTTLES DRAWN AEROBIC ONLY 3CC  Final   Culture   Final           BLOOD CULTURE RECEIVED NO GROWTH TO DATE CULTURE WILL BE HELD FOR 5 DAYS BEFORE ISSUING A FINAL NEGATIVE REPORT Performed at [facility]    Report Status PENDING  Incomplete  Clostridium Difficile by PCR     Status: Abnormal   Collection Time: 08/15/14  9:21 AM  Result Value Ref Range Status   C difficile by pcr POSITIVE (A) NEGATIVE Final    Comment: CRITICAL RESULT CALLED TO, READ BACK BY AND VERIFIED WITH: Freddi Starr RN 12:35 08/15/14 ([email or web address])     Medical History: Past Medical History  Diagnosis Date  . Venous insufficiency of leg   . Morbid obesity   . Venous stasis dermatitis   . CHF (congestive heart failure)     Medications:  Prescriptions prior to admission  Medication Sig Dispense Refill Last Dose  . methadone (DOLOPHINE)  10 MG tablet Take 17 tablets (170 mg total) by mouth daily. (Patient taking differently: Take 180 mg by mouth daily. ) 17 tablet 0 08/11/2014 at Unknown time  . [DISCONTINUED] doxycycline (VIBRA-TABS) 100 MG tablet Take 1 tablet (100 mg total) by mouth 2 (two) times daily. (Patient not taking: Reported on 08/12/2014) 5 tablet 0 Completed Course at Unknown time  . [DISCONTINUED] furosemide (LASIX) 40 MG tablet Take 1 tablet (40 mg total) by mouth daily. (Patient not taking: Reported on 08/12/2014) 30 tablet 0 Not Taking at Unknown time  . [DISCONTINUED] potassium chloride 20 MEQ TBCR Take 20 mEq by mouth daily. (Patient not taking: Reported on 08/12/2014) 30 tablet 0 Not Taking at Unknown time  . [DISCONTINUED] sulfamethoxazole-trimethoprim (SEPTRA DS) 800-160 MG per tablet Take 1 tablet by mouth 2 (two) times daily. (Patient not taking: Reported on 08/12/2014) 24 tablet 0 Completed Course at Unknown time   Assessment: 54 y.o. male with fever, LE venous stasis, possible cellulitis.  Infectious Disease: Group G Strep bacteremia>Rocephin, abx now broadened back to vancomycin/cefepime, WBC has trended down to wnl, Tm 99.2. Now +Cdiff. Cellulitis is still extremely severe.  Vancomycin trough drawn from picc this afternoon, slightly late but within goal range. Will continue current dose and recheck trough later in the week.  5/2 VT =11 5/8 VT =13  Flagyl 5/5>> Vancomycin 5/1>5/4, 5/7>> Zosyn 5/1>5/2 Cefepime 5/7>>  Goal of Therapy:  Vancomycin trough 10-15  Plan:  Continue Vancomycin 2000mg  IV q8h Cefepime 2g q12 hours  Erin Hearing PharmD., BCPS Clinical Pharmacist Pager [phone] 08/19/2014 7:29 PM

## 2014-08-20 DIAGNOSIS — E876 Hypokalemia: Secondary | ICD-10-CM

## 2014-08-20 LAB — GI PATHOGEN PANEL BY PCR, STOOL
C difficile toxin A/B: NOT DETECTED
Campylobacter by PCR: NOT DETECTED
Cryptosporidium by PCR: NOT DETECTED
E coli (ETEC) LT/ST: NOT DETECTED
E coli (STEC): NOT DETECTED
E coli 0157 by PCR: NOT DETECTED
G lamblia by PCR: NOT DETECTED
Norovirus GI/GII: NOT DETECTED
ROTAVIRUS A BY PCR: NOT DETECTED
SALMONELLA BY PCR: NOT DETECTED
Shigella by PCR: NOT DETECTED

## 2014-08-20 LAB — CULTURE, BLOOD (ROUTINE X 2)
CULTURE: NO GROWTH
Culture: NO GROWTH

## 2014-08-20 MED ORDER — METRONIDAZOLE 500 MG PO TABS: 500.0000 mg | ORAL_TABLET | Freq: Three times a day (TID) | ORAL | Status: DC

## 2014-08-20 MED ORDER — FUROSEMIDE 10 MG/ML IJ SOLN: 40.0000 mg | Freq: Two times a day (BID) | INTRAMUSCULAR | Status: DC

## 2014-08-20 MED ORDER — SULFAMETHOXAZOLE-TRIMETHOPRIM 800-160 MG PO TABS: 1.0000 | ORAL_TABLET | Freq: Two times a day (BID) | ORAL | Status: DC

## 2014-08-20 NOTE — Progress Notes (Signed)
Applied xeroform on LLE and wrapped in Kerlex and ace wrap dressing per wound care recommendation.

## 2014-08-20 NOTE — Consult Note (Signed)
WOC wound follow up Wound type: resolving cellulitis LLE, with bilateral venous stasis disease.   The LLE has improved however the bulla are still present and draining, I suspect the skin will slough off.  I will add xeroform gauze over these areas for pending DC to home for the antibacterial effects and would recommend [facility] (challenging as he has no insurance) to monitor the status of the wounds and teach patient and family dressing changes. When I arrived patient has RLE hanging off the bed for comfort.  Pt has removed or had staff remove the Unna's boot on the RLE due to "itching" Measurement: large draining bulla on the LLE lateral, ruptured bulla on the medial LLE with dried serous crust over the area. Bulla on the lateral LLE is not serous any longer, it is thicker but not purulent. Wound bed: see above Drainage (amount, consistency, odor) serous Periwound: hemosiderin staining bilaterally, improved but not resolved cellulitis LLE Dressing procedure/placement/frequency: Xeroform gauze over the blisters LLE, kerlix, and ACE to the LLE. Would suggest pt to be set up in community clinic for follow up and [facility] if possible to monitor until patient can be seen in the clinic.  Not sure if this is feasible based on lack of insurance.  Discussed POC with patient. Re consult if needed, will not follow at this time. Thanks  Milderd Manocchio Kellogg, [employer] [phone])

## 2014-08-20 NOTE — Progress Notes (Signed)
Discharge instruction and prescriptions given to patient. Stated he was going to ask for some more potassium when he goes to his follow up appt since he is on the Lasix. Gave him the prescription for the Flagyl and he asked the reason he was taking it and told him for the C-diff. Patient stated he does not have C-diff and never did have it. Awaiting pick up from friend via private vehicle. No s/sx of acute distress. PICC removed by IV RN.

## 2014-08-20 NOTE — Discharge Summary (Addendum)
Physician Discharge Summary  [name] WUJ:[id number] DOB: May 19, 1960 DOA: 08/11/2014  PCP: No primary care provider on file.  Admit date: 08/11/2014 Discharge date: 08/20/2014  Time spent: 35 minutes  Recommendations for Outpatient Follow-up:  1. Home health/wound care 2. BMP 1 week re Cr 3.   Discharge Diagnoses:  Principal Problem:   Sepsis due to cellulitis Active Problems:   Venous stasis dermatitis   Morbid obesity   Chronic narcotic use   COPD exacerbation   Cellulitis and abscess of leg, except foot   Cellulitis of foot, left   Bacteremia   Hypokalemia   Cellulitis and abscess of foot   Discharge Condition: improved  Diet recommendation: cardiac  Filed Weights   08/15/14 0500 08/17/14 0500 08/18/14 0359  Weight: 195.047 kg (430 lb) 195.772 kg (431 lb 9.6 oz) 191.781 kg (422 lb 12.8 oz)    History of present illness:  [name] is a 54 y.o. male with Past medical history of chronic venous insufficiency, morbid obesity, history of CHF, history of COPD, history of substance abuse currently on methadone. The patient is presenting with complains of abdominal pain ongoing for last 2 days. He denies any diarrhea constipation denies any nausea or vomiting denies any chest pain but does complains of not feeling well. He also complains of cough with greenish expectoration as well as shortness of breath progressively worsening over last 1 week. He has chronic leg swellings. At the time of my evaluation his left leg was red and swollen and he mentions that this is more swollen than his usual and does not appear red leg this. He denies any fall trauma injury. He denies any dizziness or lightheadedness or focal deficit. He mentions he is an active smoker and smokes one pack a day. He is only taking methadone and does not take any other medications at present.  The patient is coming from home. And at his baseline independent for most of his ADL.  Hospital Course:   Sepsis Secondary to cellulitis and group G strep bacteremia PCN sensitive. See below  Cellulitis of the left lower extremity  Wound care and PO bactrim  c diff pcr pos On flagyl. No diarrhea, but for some reason, stool sample was sent for C. difficile. Surprisingly, came back positive. Discussed with ID. Doubt clinically significant CT abd on admission without colitis Flagyl while on abx  Thrombocytopenia;  Suspect related to infection. mild  Hypokalemia;  replaced  Acute respiratory failure/hypoxemia  -Suspect he had 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. No clinical evidence of CHF. Echocardiogram with normal EF  Tobacco abuse/COPD  -Tobacco cessation discussed   Chronic venous stasis dermatitis: Patient had an Unna boot on his right leg.  He removed  History of polysubstance abuse -Continue home dose methadone    Procedures:    Consultations:  [facility]  Discharge Exam: Filed Vitals:   08/20/14 0549  BP: 129/63  Pulse: 69  Temp: 97.7 F (36.5 C)  Resp: 18    General: A+Ox3, nad Cardiovascular: rrr   Discharge Instructions   Discharge Instructions    Diet - low sodium heart healthy    Complete by:  As directed      Discharge instructions    Complete by:  As directed   Wound care BMp 1 week re cr Xeroform gauze over the blisters LLE, kerlix, and ACE to the LLE     Increase activity slowly    Complete by:  As directed  Current Discharge Medication List    START taking these medications   Details  furosemide (LASIX) 10 MG/ML injection Inject 4 mLs (40 mg total) into the vein 2 (two) times daily. Qty: 4 mL, Refills: 0    metroNIDAZOLE (FLAGYL) 500 MG tablet Take 1 tablet (500 mg total) by mouth every 8 (eight) hours. Qty: 21 tablet, Refills: 0    sulfamethoxazole-trimethoprim (BACTRIM DS,SEPTRA DS) 800-160 MG per tablet Take 1 tablet by mouth 2 (two) times daily. Qty: 12 tablet, Refills: 0      CONTINUE these  medications which have NOT CHANGED   Details  methadone (DOLOPHINE) 10 MG tablet Take 17 tablets (170 mg total) by mouth daily. Qty: 17 tablet, Refills: 0       No Known Allergies Follow-up Information    Follow up with [facility]    .   Why:  You have an appointment scheduled at the [facility] on Wednesday, May11,2016 at 3pm with Dr. Jarold Song. Please arrive at 2:30pm    Contact information:   [address] [place]  [id number] [phone]       The results of significant diagnostics from this hospitalization (including imaging, microbiology, ancillary and laboratory) are listed below for reference.    Significant Diagnostic Studies: Dg Chest 2 View  08/14/2014   CLINICAL DATA:  Bilateral leg swelling and short of breath  EXAM: CHEST  2 VIEW  COMPARISON:  Radiograph 08/12/2014  FINDINGS: Stable enlarged cardiac silhouette. There is increased central venous congestion and mild interstitial edema. No pleural fluid. No focal consolidation. No pneumothorax.  IMPRESSION: Increased central venous congestion and interstitial edema.   Electronically Signed   By: Suzy Bouchard M.D.   On: 08/14/2014 01:57   Ct Abdomen Pelvis W Contrast  08/12/2014   CLINICAL DATA:  Acute onset of generalized abdominal pain and back pain. Dysuria. Nausea and vomiting. Initial encounter.  EXAM: CT ABDOMEN AND PELVIS WITH CONTRAST  TECHNIQUE: Multidetector CT imaging of the abdomen and pelvis was performed using the standard protocol following bolus administration of intravenous contrast.  CONTRAST:  100 mL of Omnipaque 300 IV contrast  COMPARISON:  CT of the abdomen and pelvis from 07/12/2011  FINDINGS: The visualized lung bases are clear.  There is diffuse fatty infiltration within the liver. The liver and spleen are otherwise unremarkable. The gallbladder is within normal limits. The pancreas and adrenal glands are unremarkable.  A 2.9  cm cyst is noted at the upper pole of the right kidney. Mild nonspecific perinephric stranding is noted bilaterally. A nonobstructing 1.0 cm stone is noted at the lower pole of the left kidney. The kidneys are otherwise unremarkable. There is no evidence of hydronephrosis. No obstructing ureteral stones are identified.  No free fluid is identified. The small bowel is unremarkable in appearance. The stomach is within normal limits. No acute vascular abnormalities are seen. Mild calcification is noted along the abdominal aorta.  The appendix is normal in caliber, without evidence of appendicitis. The colon is unremarkable in appearance.  The bladder is mildly distended and grossly unremarkable in appearance. The prostate is diminutive and grossly unremarkable. No inguinal lymphadenopathy is seen.  No acute osseous abnormalities are identified. Mild degenerative change is noted at the pubic symphysis. Multilevel vacuum phenomenon is noted along the lower thoracic and lumbar spine.  IMPRESSION: 1. No acute abnormality seen to explain the patient's symptoms. 2. Nonobstructing 1.0 cm stone at the lower pole of  the left kidney. 3. No evidence of hydronephrosis. Small right renal cyst noted. 4. Diffuse fatty infiltration within the liver.   Electronically Signed   By: Garald Balding M.D.   On: 08/12/2014 01:48   Ct Foot Left W Contrast  08/14/2014   CLINICAL DATA:  Cellulitis left lower leg.  Venous stasis.  Sepsis.  EXAM: CT OF THE LOWER BILATERAL EXTREMITY WITH CONTRAST  TECHNIQUE: Multidetector CT imaging of the lower extremities, scanning side by side, was performed according to the standard protocol following intravenous contrast administration.  COMPARISON:  Radiographs 07/17/2013  CONTRAST:  173mL OMNIPAQUE IOHEXOL 300 MG/ML  SOLN  FINDINGS: There is marked subcutaneous edema throughout the left lower extremity consistent with the described history of cellulitis. There is no soft tissue gas. There is no drainable  fluid collection. The bones are intact. There is no bone lesion or bony destruction.  There is no foreign body evident.  IMPRESSION: *Marked subcutaneous edema consistent with cellulitis, left lower extremity *No soft tissue gas. No foreign body. No drainable fluid collection. *No evidence of osteomyelitis.   Electronically Signed   By: Andreas Newport M.D.   On: 08/14/2014 02:46   Dg Chest Port 1 View  08/12/2014   CLINICAL DATA:  Acute onset of right-sided abdominal pain. Fever. Dysuria, nausea and vomiting. Initial encounter.  EXAM: PORTABLE CHEST - 1 VIEW  COMPARISON:  Chest radiograph performed 07/09/2013  FINDINGS: The lungs are well-aerated. Vascular congestion is noted. Increased interstitial markings may reflect mild interstitial edema or possibly pneumonia, given the patient's symptoms. There is no evidence of pleural effusion or pneumothorax.  The cardiomediastinal silhouette is within normal limits. No acute osseous abnormalities are seen.  IMPRESSION: Vascular congestion noted. Increased interstitial markings may reflect mild interstitial edema or possibly pneumonia, given the patient's symptoms.   Electronically Signed   By: Garald Balding M.D.   On: 08/12/2014 04:14   Ct Extrem Lower W Cm Bil  08/14/2014   CLINICAL DATA:  Cellulitis left lower leg.  Venous stasis.  Sepsis.  EXAM: CT OF THE LOWER BILATERAL EXTREMITY WITH CONTRAST  TECHNIQUE: Multidetector CT imaging of the lower extremities, scanning side by side, was performed according to the standard protocol following intravenous contrast administration.  COMPARISON:  Radiographs 07/17/2013  CONTRAST:  12mL OMNIPAQUE IOHEXOL 300 MG/ML  SOLN  FINDINGS: There is marked subcutaneous edema throughout the left lower extremity consistent with the described history of cellulitis. There is no soft tissue gas. There is no drainable fluid collection. The bones are intact. There is no bone lesion or bony destruction.  There is no foreign body evident.   IMPRESSION: *Marked subcutaneous edema consistent with cellulitis, left lower extremity *No soft tissue gas. No foreign body. No drainable fluid collection. *No evidence of osteomyelitis.   Electronically Signed   By: Andreas Newport M.D.   On: 08/14/2014 02:46    Microbiology: Recent Results (from the past 240 hour(s))  Urine culture     Status: None   Collection Time: 08/11/14 11:39 PM  Result Value Ref Range Status   Specimen Description URINE, CLEAN CATCH  Final   Special Requests NONE  Final   Colony Count NO GROWTH Performed at [facility]   Final   Culture NO GROWTH Performed at [facility]   Final   Report Status 08/13/2014 FINAL  Final  Culture, blood (routine x 2)     Status: None   Collection Time: 08/12/14 12:25 AM  Result Value Ref Range  Status   Specimen Description BLOOD RIGHT ARM  Final   Special Requests BOTTLES DRAWN AEROBIC AND ANAEROBIC 4CC EACH  Final   Culture   Final    STREPTOCOCCUS GROUP G Note: Gram Stain Report Called to,Read Back By and Verified With: Minerva Fester RN [phone] Performed at [facility]    Report Status 08/15/2014 FINAL  Final   Organism ID, Bacteria STREPTOCOCCUS GROUP G  Final      Susceptibility   Streptococcus group g - MIC (ETEST)*    PENICILLIN .032 SENSITIVE Sensitive     * STREPTOCOCCUS GROUP G  Culture, blood (routine x 2)     Status: None   Collection Time: 08/12/14 12:29 AM  Result Value Ref Range Status   Specimen Description BLOOD RIGHT HAND  Final   Special Requests BOTTLES DRAWN AEROBIC AND ANAEROBIC 5CC EACH  Final   Culture   Final    STREPTOCOCCUS GROUP G Note: SUSCEPTIBILITIES PERFORMED ON PREVIOUS CULTURE WITHIN THE LAST 5 DAYS. Note: Gram Stain Report Called to,Read Back By and Verified With: RUTH LEIGH RN [phone] Performed at [facility]    Report Status 08/15/2014 FINAL  Final  Culture, blood (routine x 2)     Status: None   Collection Time: 08/14/14  5:50 AM  Result Value  Ref Range Status   Specimen Description BLOOD LEFT HAND  Final   Special Requests BOTTLES DRAWN AEROBIC ONLY 5CC  Final   Culture   Final    NO GROWTH 5 DAYS Performed at [facility]    Report Status 08/20/2014 FINAL  Final  Culture, blood (routine x 2)     Status: None   Collection Time: 08/14/14  6:05 AM  Result Value Ref Range Status   Specimen Description BLOOD RIGHT HAND  Final   Special Requests BOTTLES DRAWN AEROBIC ONLY 3CC  Final   Culture   Final    NO GROWTH 5 DAYS Performed at [facility]    Report Status 08/20/2014 FINAL  Final  Clostridium Difficile by PCR     Status: Abnormal   Collection Time: 08/15/14  9:21 AM  Result Value Ref Range Status   C difficile by pcr POSITIVE (A) NEGATIVE Final    Comment: CRITICAL RESULT CALLED TO, READ BACK BY AND VERIFIED WITH: Freddi Starr RN 12:35 08/15/14 ([email or web address])      Labs: Basic Metabolic Panel:  Recent Labs Lab 08/14/14 0605 08/15/14 0617 08/17/14 0540 08/18/14 0417 08/19/14 0545  NA 134* 134* 136 134* 134*  K 2.8* 3.7 3.5 3.2* 3.9  CL 102 100* 99* 95* 98*  CO2 23 23 29 30 29   GLUCOSE 82 101* 117* 123* 127*  BUN 6 6 >5* 5* 7  CREATININE 0.76 0.63 0.69 0.68 0.71  CALCIUM 7.8* 8.1* 8.4* 8.3* 8.6*   Liver Function Tests: No results for input(s): AST, ALT, ALKPHOS, BILITOT, PROT, ALBUMIN in the last 168 hours. No results for input(s): LIPASE, AMYLASE in the last 168 hours. No results for input(s): AMMONIA in the last 168 hours. CBC:  Recent Labs Lab 08/14/14 0605 08/15/14 0617 08/18/14 0417  WBC 7.7 6.0 8.0  HGB 10.9* 11.7* 10.6*  HCT 32.1* 34.7* 31.9*  MCV 84.7 85.7 84.2  PLT 118* 134* 276   Cardiac Enzymes: No results for input(s): CKTOTAL, CKMB, CKMBINDEX, TROPONINI in the last 168 hours. BNP: BNP (last 3 results)  Recent Labs  08/12/14 0705  BNP 164.6*    ProBNP (last 3 results) No results  for input(s): PROBNP in the last 8760 hours.  CBG: No results for input(s): GLUCAP  in the last 168 hours.     SignedEulogio Bear  [facility] 08/20/2014, 12:32 PM

## 2014-08-20 NOTE — Care Management Note (Signed)
Case Management Note  Patient Details  Name: Reg Bircher MRN: [id number] Date of Birth: 1961-02-02  Subjective/Objective:   54 yr old male admitted with sepsis due to cellulitis of lower legs.                 Action/Plan:   Case manager made followup appointment for patient at [facility]. Appt is Wednesday, Aug 22, 2014 at 3:00pm with Dr. Jarold Song.    Expected Discharge Date: 08/20/14                   Expected Discharge Plan:  Home/self care   In-House Referral:     Discharge planning Services     Post Acute Care Choice:    Choice offered to:     DME Arranged: NA   DME Agency:     HH Arranged: NA HH Agency:     Status of Service: Completed    Medicare Important Message Given:    Date Medicare IM Given:    Medicare IM give by:    Date Additional Medicare IM Given:    Additional Medicare Important Message give by:     If discussed at [place] of Stay Meetings, dates discussed:    Additional Comments:  Ninfa Meeker, RN 08/20/2014, 12:04 PM

## 2014-08-22 ENCOUNTER — Ambulatory Visit: Payer: Medicaid Other | Attending: Family Medicine | Admitting: Family Medicine

## 2014-08-22 ENCOUNTER — Encounter: Payer: Self-pay | Admitting: Family Medicine

## 2014-08-22 VITALS — BP 144/83 | HR 90 | Temp 98.6°F | Resp 18 | Ht 73.0 in | Wt >= 6400 oz

## 2014-08-22 DIAGNOSIS — I872 Venous insufficiency (chronic) (peripheral): Secondary | ICD-10-CM

## 2014-08-22 DIAGNOSIS — IMO0001 Reserved for inherently not codable concepts without codable children: Secondary | ICD-10-CM | POA: Insufficient documentation

## 2014-08-22 DIAGNOSIS — B954 Other streptococcus as the cause of diseases classified elsewhere: Secondary | ICD-10-CM | POA: Insufficient documentation

## 2014-08-22 DIAGNOSIS — L03116 Cellulitis of left lower limb: Secondary | ICD-10-CM | POA: Insufficient documentation

## 2014-08-22 DIAGNOSIS — R030 Elevated blood-pressure reading, without diagnosis of hypertension: Secondary | ICD-10-CM | POA: Diagnosis not present

## 2014-08-22 DIAGNOSIS — F17210 Nicotine dependence, cigarettes, uncomplicated: Secondary | ICD-10-CM | POA: Insufficient documentation

## 2014-08-22 DIAGNOSIS — A047 Enterocolitis due to Clostridium difficile: Secondary | ICD-10-CM | POA: Insufficient documentation

## 2014-08-22 DIAGNOSIS — I8311 Varicose veins of right lower extremity with inflammation: Secondary | ICD-10-CM

## 2014-08-22 DIAGNOSIS — Z79891 Long term (current) use of opiate analgesic: Secondary | ICD-10-CM | POA: Diagnosis not present

## 2014-08-22 DIAGNOSIS — E876 Hypokalemia: Secondary | ICD-10-CM

## 2014-08-22 DIAGNOSIS — Z87898 Personal history of other specified conditions: Secondary | ICD-10-CM | POA: Diagnosis not present

## 2014-08-22 DIAGNOSIS — I509 Heart failure, unspecified: Secondary | ICD-10-CM | POA: Diagnosis not present

## 2014-08-22 DIAGNOSIS — J449 Chronic obstructive pulmonary disease, unspecified: Secondary | ICD-10-CM | POA: Insufficient documentation

## 2014-08-22 DIAGNOSIS — A0472 Enterocolitis due to Clostridium difficile, not specified as recurrent: Secondary | ICD-10-CM

## 2014-08-22 DIAGNOSIS — Z6841 Body Mass Index (BMI) 40.0 and over, adult: Secondary | ICD-10-CM | POA: Insufficient documentation

## 2014-08-22 DIAGNOSIS — I8312 Varicose veins of left lower extremity with inflammation: Secondary | ICD-10-CM

## 2014-08-22 NOTE — Patient Instructions (Signed)

## 2014-08-22 NOTE — Progress Notes (Signed)
Subjective:    Patient ID: [name], male    DOB: [birth year], 54 y.o.   MRN: [id number]  HPI  Admit date: 08/11/14 Discharge date: 08/20/14  [name] had presented to the ED with complaints of abdominal pain, cough with greenish sputum and was found to have significant erythema and edema of the left leg. Medical history is notable for chronic venous insufficiency, morbid obesity, COPD, history of substance abuse on methadone.   He was found to have a fever of 102.6, tachycardia, hypoxia, leukocytosis of 16.2, lactic acidosis of 3.30 and elevated blood pressure on presentation. Chest x-ray revealed vascular congestion with mild interstitial edema or possible pneumonia; CT abdomen was negative for any acute abnormality. Bilateral lower extremity Doppler was negative for DVT.   He received IV fluids, was placed on vancomycin and Zosyn. Blood culture revealed group G Streptococcus he was switched to Rocephin. He had a stool culture which is positive for C. difficile and he was placed on Flagyl. He was also seen by wound care, Xeroform and Kerlix applied and the plan was to continue antibiotics and to change dressing every Friday and once cellulitis resolved with placement or Unna boot.  He was discharged on Bactrim and WBC had trended down to 8.0 at discharge.    Interval history: Has been taking Bactrim daily rather than bid., Denies fever. L leg is still edematous and edematous, he has not had the dressing changed since he left the hospital 2 days ago.  Past Medical History  Diagnosis Date  . Venous insufficiency of leg   . Morbid obesity   . Venous stasis dermatitis   . CHF (congestive heart failure)     Past Surgical History  Procedure Laterality Date  . Carpal tunnel release      bilateral   . Eye surgery      History   Social History  . Marital Status: Legally Separated    Spouse Name: N/A  . Number of Children: N/A  . Years of Education: N/A   Occupational History    . Not on file.   Social History Main Topics  . Smoking status: Current Every Day Smoker -- 0.50 packs/day for 35 years    Types: Cigarettes  . Smokeless tobacco: Never Used     Comment: currently only smoking 0.5 ppd  . Alcohol Use: No  . 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 Sexual Activity: Not on file   Other Topics Concern  . Not on file   Social History Narrative   Divorced man.  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  Lives in [place], [place].  4 children all in good health.  3 sisters in good health.  Father in poor health near by    No Known Allergies  Current Outpatient Prescriptions on File Prior to Visit  Medication Sig Dispense Refill  . furosemide (LASIX) 10 MG/ML injection Inject 4 mLs (40 mg total) into the vein 2 (two) times daily. 4 mL 0  . methadone (DOLOPHINE) 10 MG tablet Take 17 tablets (170 mg total) by mouth daily. (Patient taking differently: Take 180 mg by mouth daily. ) 17 tablet 0  . sulfamethoxazole-trimethoprim (BACTRIM DS,SEPTRA DS) 800-160 MG per tablet Take 1 tablet by mouth 2 (two) times daily. 12 tablet 0  . metroNIDAZOLE (FLAGYL) 500 MG tablet Take 1 tablet (500 mg total)  by mouth every 8 (eight) hours. (Patient not taking: Reported on 08/22/2014) 21 tablet 0   No current facility-administered medications on file prior to visit.    Review of Systems  Constitutional: Negative for activity change and appetite change.  HENT: Negative for sinus pressure and sore throat.   Eyes: Negative for visual disturbance.  Respiratory: Negative for chest tightness and shortness of breath.   Cardiovascular: Negative for chest pain and palpitations.  Gastrointestinal: Negative for abdominal pain and abdominal distention.  Endocrine: Negative for cold intolerance, heat intolerance and polyphagia.  Genitourinary: Negative for dysuria,  frequency and difficulty urinating.  Musculoskeletal: Negative for back pain, joint swelling and arthralgias.  Skin:       See HPI  Neurological: Negative for dizziness, tremors and weakness.  Psychiatric/Behavioral: Negative for suicidal ideas and behavioral problems.        Objective: Filed Vitals:   08/22/14 1454  BP: 144/83  Pulse: 90  Temp: 98.6 F (37 C)  Resp: 18      Physical Exam  Constitutional: He is oriented to person, place, and time. He appears well-developed and well-nourished.  Morbidly obese  HENT:  Head: Normocephalic and atraumatic.  Right Ear: External ear normal.  Left Ear: External ear normal.  Eyes: Conjunctivae and EOM are normal. Pupils are equal, round, and reactive to light.  Neck: Normal range of motion. Neck supple. No tracheal deviation present.  Cardiovascular: Normal rate, regular rhythm and normal heart sounds.   No murmur heard. Pulmonary/Chest: Effort normal and breath sounds normal. No respiratory distress. He has no wheezes. He exhibits no tenderness.  Abdominal: Soft. Bowel sounds are normal. He exhibits no mass. There is no tenderness.  Musculoskeletal: Normal range of motion. He exhibits edema. He exhibits no tenderness.  Neurological: He is alert and oriented to person, place, and time.  Skin:  Left lower extremity with extensive disclamation of skin and associated erythema of the leg sparing the foot. Mildly yellowish discoloration of the mid leg with no purulent discharge. Tender to palpation.  Right lower extremity: No lesions, mildly edematous.  Psychiatric: He has a normal mood and affect.   CBC Latest Ref Rng 08/18/2014 08/15/2014 08/14/2014  WBC 4.0 - 10.5 K/uL 8.0 6.0 7.7  Hemoglobin 13.0 - 17.0 g/dL 10.6(L) 11.7(L) 10.9(L)  Hematocrit 39.0 - 52.0 % 31.9(L) 34.7(L) 32.1(L)  Platelets 150 - 400 K/uL 276 134(L) 118(L)    CMP Latest Ref Rng 08/19/2014 08/18/2014 08/17/2014  Glucose 70 - 99 mg/dL 127(H) 123(H) 117(H)  BUN 6 - 20 mg/dL  7 5(L) >5(L)  Creatinine 0.61 - 1.24 mg/dL 0.71 0.68 0.69  Sodium 135 - 145 mmol/L 134(L) 134(L) 136  Potassium 3.5 - 5.1 mmol/L 3.9 3.2(L) 3.5  Chloride 101 - 111 mmol/L 98(L) 95(L) 99(L)  CO2 22 - 32 mmol/L 29 30 29   Calcium 8.9 - 10.3 mg/dL 8.6(L) 8.3(L) 8.4(L)  Total Protein 6.5 - 8.1 g/dL - - -  Total Bilirubin 0.3 - 1.2 mg/dL - - -  Alkaline Phos 38 - 126 U/L - - -  AST 15 - 41 U/L - - -  ALT 17 - 63 U/L - - -           Assessment & Plan:  54 year old morbidly obese patient with a history of chronic venous stasis dermatitis recently hospitalized for left lower extremity cellulitis and group G Streptococcus bacteremia.  Left leg cellulitis: Currently on Bactrim. Dressing change performed today in the clinic and he has been advised to  have it changed in 2 days; he states his sister will be coming over and should help him with it. I will see him back in 5 days and will place a referral to the wound care clinic as well as obtaining home health nurse with lack of insurance will be challenging. He will need to have an A1c at his next visit to exclude a diagnosis of diabetes mellitus. CBC, CMET at next visit  Elevated blood pressure: No previous history of hypertension and so I will reassess at his next visit and determine the need for initiation of antihypertensive.

## 2014-08-22 NOTE — Progress Notes (Signed)
Patient hospitalized for cellulitis of left leg, went septic, discharged 08/20/14. Patient reports "Today I've felt the best I have in years, but that's slowly going away". Patient has pain in left leg, at level 8, described as piercing, ripping. Patient has not been taking lasix because prescription requested IV lasix. Patient has not been taking flagyl, patient says it was for cdiff and doctor says it was a false positive, patient has not ahd any diarrhea.

## 2014-08-28 ENCOUNTER — Ambulatory Visit: Payer: Medicaid Other | Attending: Family Medicine | Admitting: Family Medicine

## 2014-08-28 ENCOUNTER — Encounter: Payer: Self-pay | Admitting: Family Medicine

## 2014-08-28 VITALS — BP 133/80 | HR 95 | Temp 98.3°F | Resp 20 | Ht 73.0 in | Wt >= 6400 oz

## 2014-08-28 DIAGNOSIS — R7309 Other abnormal glucose: Secondary | ICD-10-CM | POA: Diagnosis not present

## 2014-08-28 DIAGNOSIS — E876 Hypokalemia: Secondary | ICD-10-CM | POA: Insufficient documentation

## 2014-08-28 DIAGNOSIS — I872 Venous insufficiency (chronic) (peripheral): Secondary | ICD-10-CM | POA: Diagnosis not present

## 2014-08-28 DIAGNOSIS — L03116 Cellulitis of left lower limb: Secondary | ICD-10-CM | POA: Diagnosis present

## 2014-08-28 DIAGNOSIS — R7303 Prediabetes: Secondary | ICD-10-CM | POA: Insufficient documentation

## 2014-08-28 LAB — COMPREHENSIVE METABOLIC PANEL
ALT: 23 U/L (ref 0–53)
AST: 20 U/L (ref 0–37)
Albumin: 3.8 g/dL (ref 3.5–5.2)
Alkaline Phosphatase: 53 U/L (ref 39–117)
BILIRUBIN TOTAL: 0.4 mg/dL (ref 0.2–1.2)
BUN: 12 mg/dL (ref 6–23)
CHLORIDE: 101 meq/L (ref 96–112)
CO2: 23 mEq/L (ref 19–32)
Calcium: 9.1 mg/dL (ref 8.4–10.5)
Creat: 0.98 mg/dL (ref 0.50–1.35)
Glucose, Bld: 124 mg/dL — ABNORMAL HIGH (ref 70–99)
Potassium: 4.4 mEq/L (ref 3.5–5.3)
Sodium: 135 mEq/L (ref 135–145)
Total Protein: 8.3 g/dL (ref 6.0–8.3)

## 2014-08-28 LAB — POCT GLYCOSYLATED HEMOGLOBIN (HGB A1C): Hemoglobin A1C: 6.2

## 2014-08-28 MED ORDER — POTASSIUM CHLORIDE CRYS ER 10 MEQ PO TBCR: 10.0000 meq | EXTENDED_RELEASE_TABLET | Freq: Every day | ORAL | Status: DC

## 2014-08-28 MED ORDER — FUROSEMIDE 40 MG PO TABS: 40.0000 mg | ORAL_TABLET | Freq: Every day | ORAL | Status: DC

## 2014-08-28 NOTE — Progress Notes (Signed)
Subjective:    Patient ID: [name], male    DOB: [birth year], 54 y.o.   MRN: [id number]  HPI  [name] was seen at his last office visit for a hospital follow-up of left lower extremity cellulitis and was on Bactrim DS at that time. He did have his dressing change at his last office visit and was referred to wound care for management of cellulitis and chronic venous insufficiency. His blood pressure was also mildly elevated at his last visit but is better now.  Interval history: His left leg is doing better but he does complain of burning pain and serous discharge in the posterior aspect of his left Calf. He completed his course of Bactrim yesterday. He did have some furosemide pills at home which she had been taking at 80 mg daily.  Past Medical History  Diagnosis Date  . Venous insufficiency of leg   . Morbid obesity   . Venous stasis dermatitis   . CHF (congestive heart failure)    Past Surgical History  Procedure Laterality Date  . Carpal tunnel release      bilateral   . Eye surgery     History   Social History  . Marital Status: Legally Separated    Spouse Name: N/A  . Number of Children: N/A  . Years of Education: N/A   Occupational History  . Not on file.   Social History Main Topics  . Smoking status: Current Every Day Smoker -- 0.50 packs/day for 35 years    Types: Cigarettes  . Smokeless tobacco: Never Used     Comment: currently only smoking 0.5 ppd  . Alcohol Use: No  . 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 Sexual Activity: Not on file   Other Topics Concern  . Not on file   Social History Narrative   Divorced man.  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  Lives in [place], [place].  4 children all in good health.  3 sisters in good health.  Father in poor health near by   Family History  Problem Relation Age of Onset  .  Diabetes Father   . Heart disease Father   . Hypertension Father   . Colon cancer Father   . Leukemia Mother     passed away 2008/07/19    No Known Allergies  Current Outpatient Prescriptions on File Prior to Visit  Medication Sig Dispense Refill  . methadone (DOLOPHINE) 10 MG tablet Take 17 tablets (170 mg total) by mouth daily. (Patient taking differently: Take 180 mg by mouth daily. ) 17 tablet 0  . metroNIDAZOLE (FLAGYL) 500 MG tablet Take 1 tablet (500 mg total) by mouth every 8 (eight) hours. (Patient not taking: Reported on 08/22/2014) 21 tablet 0  . sulfamethoxazole-trimethoprim (BACTRIM DS,SEPTRA DS) 800-160 MG per tablet Take 1 tablet by mouth 2 (two) times daily. (Patient not taking: Reported on 08/28/2014) 12 tablet 0   No current facility-administered medications on file prior to visit.     Review of Systems  Constitutional: Negative for activity change and appetite change.  HENT: Negative for sinus pressure and sore throat.   Eyes: Negative for visual disturbance.  Respiratory: Negative for chest tightness and shortness of breath.   Cardiovascular: Positive for leg swelling. Negative for chest pain and palpitations.  Gastrointestinal: Negative for abdominal pain and abdominal distention.  Genitourinary:  Negative for dysuria, frequency and difficulty urinating.  Musculoskeletal:       See hpi  Skin: Positive for color change.       See hpi  Neurological: Positive for numbness. Negative for dizziness, tremors and weakness.  Psychiatric/Behavioral: Negative for suicidal ideas and behavioral problems.       Objective: Filed Vitals:   08/28/14 1220  BP: 133/80  Pulse: 95  Temp: 98.3 F (36.8 C)  TempSrc: Oral  Resp: 20  Height: 6\' 1"  (1.854 m)  Weight: 416 lb (188.696 kg)  SpO2: 93%      Physical Exam  Constitutional: He is oriented to person, place, and time. He appears well-developed and well-nourished.  Obese  Neck: Normal range of motion. Neck supple. No  tracheal deviation present.  Cardiovascular: Normal rate, regular rhythm and normal heart sounds.   No murmur heard. Pulmonary/Chest: Effort normal and breath sounds normal. No respiratory distress. He has no wheezes. He exhibits no tenderness.  Abdominal: Soft. Bowel sounds are normal. He exhibits no mass. There is no tenderness.  Musculoskeletal: He exhibits edema. He exhibits no tenderness.  Neurological: He is alert and oriented to person, place, and time.  Skin:  Left lower extremity with severe excoriation of skin and resolving edema; posterior aspect of calf with minimal serous drainage and mildly tender over scab.Associated hyperpigmentation of leg.  Right lower extremity appears normal.  Psychiatric: He has a normal mood and affect.            Assessment & Plan:  54 year old male patient with chronic venous insufficiency and left lower extremity cellulitis status post completion of a course of Bactrim and is here for follow-up; previously elevated blood pressure has resolved.  Left lower extremity cellulitis: Resolving. Dressing change done with ABD and Kerlix. Advised to elevate affected extremity and I have given him a short course furosemide to help with pedal edema.  Chronic venous insufficiency: He would need to be evaluated for Unna boots down the line. Scheduled to see wound care on 09/26/14  Pre-diabetes: ADA diet and lifestyle changes.

## 2014-08-28 NOTE — Progress Notes (Signed)
Patient here for follow up left, leg cellulitis. PAtient reports changing dressing daily. Patient worried because he finished antibiotics yesterday. Patient reports left leg pain, described as burning, at level 7. Patient trying to quit smoking.

## 2014-08-28 NOTE — Progress Notes (Signed)
Quick Note:  Please inform the patient that labs are normal. Thank you. ______

## 2014-08-29 ENCOUNTER — Telehealth: Payer: Self-pay

## 2014-08-29 NOTE — Telephone Encounter (Signed)
Nurse called patient, reached voicemail. Left message for patient to call Anely Spiewak at [phone].   

## 2014-08-29 NOTE — Telephone Encounter (Signed)
-----   Message from Arnoldo Morale, MD sent at 08/28/2014 11:40 PM EDT ----- Please inform the patient that labs are normal. Thank you.

## 2014-08-30 NOTE — Telephone Encounter (Signed)
Nurse called patient, patient aware of normal labs.

## 2014-08-30 NOTE — Telephone Encounter (Signed)
-----   Message from Arnoldo Morale, MD sent at 08/28/2014 11:40 PM EDT ----- Please inform the patient that labs are normal. Thank you.

## 2014-09-17 ENCOUNTER — Ambulatory Visit: Payer: Self-pay | Admitting: Family Medicine

## 2014-09-26 ENCOUNTER — Encounter (HOSPITAL_BASED_OUTPATIENT_CLINIC_OR_DEPARTMENT_OTHER): Payer: Self-pay | Attending: Surgery

## 2015-01-15 DIAGNOSIS — F1120 Opioid dependence, uncomplicated: Secondary | ICD-10-CM | POA: Diagnosis not present

## 2015-01-16 DIAGNOSIS — F1120 Opioid dependence, uncomplicated: Secondary | ICD-10-CM | POA: Diagnosis not present

## 2015-01-17 DIAGNOSIS — F1120 Opioid dependence, uncomplicated: Secondary | ICD-10-CM | POA: Diagnosis not present

## 2015-01-18 DIAGNOSIS — F1120 Opioid dependence, uncomplicated: Secondary | ICD-10-CM | POA: Diagnosis not present

## 2015-01-21 DIAGNOSIS — F1120 Opioid dependence, uncomplicated: Secondary | ICD-10-CM | POA: Diagnosis not present

## 2015-01-25 DIAGNOSIS — F1120 Opioid dependence, uncomplicated: Secondary | ICD-10-CM | POA: Diagnosis not present

## 2015-01-28 DIAGNOSIS — F1120 Opioid dependence, uncomplicated: Secondary | ICD-10-CM | POA: Diagnosis not present

## 2015-02-01 DIAGNOSIS — F1120 Opioid dependence, uncomplicated: Secondary | ICD-10-CM | POA: Diagnosis not present

## 2015-02-04 DIAGNOSIS — F1120 Opioid dependence, uncomplicated: Secondary | ICD-10-CM | POA: Diagnosis not present

## 2015-02-08 DIAGNOSIS — F1120 Opioid dependence, uncomplicated: Secondary | ICD-10-CM | POA: Diagnosis not present

## 2015-02-11 DIAGNOSIS — F1120 Opioid dependence, uncomplicated: Secondary | ICD-10-CM | POA: Diagnosis not present

## 2015-02-14 DIAGNOSIS — F17200 Nicotine dependence, unspecified, uncomplicated: Secondary | ICD-10-CM | POA: Diagnosis not present

## 2015-02-14 DIAGNOSIS — R5383 Other fatigue: Secondary | ICD-10-CM | POA: Diagnosis not present

## 2015-02-14 DIAGNOSIS — R079 Chest pain, unspecified: Secondary | ICD-10-CM | POA: Diagnosis not present

## 2015-02-14 DIAGNOSIS — Z1211 Encounter for screening for malignant neoplasm of colon: Secondary | ICD-10-CM | POA: Diagnosis not present

## 2015-02-15 DIAGNOSIS — F1120 Opioid dependence, uncomplicated: Secondary | ICD-10-CM | POA: Diagnosis not present

## 2015-02-18 DIAGNOSIS — F1120 Opioid dependence, uncomplicated: Secondary | ICD-10-CM | POA: Diagnosis not present

## 2015-02-20 DIAGNOSIS — F1120 Opioid dependence, uncomplicated: Secondary | ICD-10-CM | POA: Diagnosis not present

## 2015-02-21 DIAGNOSIS — R079 Chest pain, unspecified: Secondary | ICD-10-CM | POA: Diagnosis not present

## 2015-02-22 DIAGNOSIS — R079 Chest pain, unspecified: Secondary | ICD-10-CM | POA: Diagnosis not present

## 2015-02-22 DIAGNOSIS — F1120 Opioid dependence, uncomplicated: Secondary | ICD-10-CM | POA: Diagnosis not present

## 2015-02-25 DIAGNOSIS — F1120 Opioid dependence, uncomplicated: Secondary | ICD-10-CM | POA: Diagnosis not present

## 2015-02-27 DIAGNOSIS — F1120 Opioid dependence, uncomplicated: Secondary | ICD-10-CM | POA: Diagnosis not present

## 2015-02-27 DIAGNOSIS — I878 Other specified disorders of veins: Secondary | ICD-10-CM | POA: Insufficient documentation

## 2015-02-27 DIAGNOSIS — F17210 Nicotine dependence, cigarettes, uncomplicated: Secondary | ICD-10-CM | POA: Insufficient documentation

## 2015-02-27 DIAGNOSIS — G4733 Obstructive sleep apnea (adult) (pediatric): Secondary | ICD-10-CM | POA: Insufficient documentation

## 2015-03-01 DIAGNOSIS — F1120 Opioid dependence, uncomplicated: Secondary | ICD-10-CM | POA: Diagnosis not present

## 2015-03-04 DIAGNOSIS — F1120 Opioid dependence, uncomplicated: Secondary | ICD-10-CM | POA: Diagnosis not present

## 2015-03-06 DIAGNOSIS — F1120 Opioid dependence, uncomplicated: Secondary | ICD-10-CM | POA: Diagnosis not present

## 2015-03-11 DIAGNOSIS — F1120 Opioid dependence, uncomplicated: Secondary | ICD-10-CM | POA: Diagnosis not present

## 2015-03-13 DIAGNOSIS — F1120 Opioid dependence, uncomplicated: Secondary | ICD-10-CM | POA: Diagnosis not present

## 2015-03-18 DIAGNOSIS — R1310 Dysphagia, unspecified: Secondary | ICD-10-CM | POA: Diagnosis not present

## 2015-03-18 DIAGNOSIS — K219 Gastro-esophageal reflux disease without esophagitis: Secondary | ICD-10-CM | POA: Diagnosis not present

## 2015-03-21 DIAGNOSIS — I7091 Generalized atherosclerosis: Secondary | ICD-10-CM | POA: Diagnosis not present

## 2015-03-21 DIAGNOSIS — S91309A Unspecified open wound, unspecified foot, initial encounter: Secondary | ICD-10-CM | POA: Diagnosis not present

## 2015-03-21 DIAGNOSIS — I890 Lymphedema, not elsewhere classified: Secondary | ICD-10-CM | POA: Diagnosis not present

## 2015-03-21 DIAGNOSIS — I739 Peripheral vascular disease, unspecified: Secondary | ICD-10-CM | POA: Diagnosis not present

## 2015-03-22 DIAGNOSIS — I83015 Varicose veins of right lower extremity with ulcer other part of foot: Secondary | ICD-10-CM | POA: Diagnosis not present

## 2015-03-22 DIAGNOSIS — L97512 Non-pressure chronic ulcer of other part of right foot with fat layer exposed: Secondary | ICD-10-CM | POA: Diagnosis not present

## 2015-03-22 DIAGNOSIS — L97812 Non-pressure chronic ulcer of other part of right lower leg with fat layer exposed: Secondary | ICD-10-CM | POA: Diagnosis not present

## 2015-03-22 DIAGNOSIS — I890 Lymphedema, not elsewhere classified: Secondary | ICD-10-CM | POA: Diagnosis not present

## 2015-03-22 DIAGNOSIS — L97511 Non-pressure chronic ulcer of other part of right foot limited to breakdown of skin: Secondary | ICD-10-CM | POA: Diagnosis not present

## 2015-03-22 DIAGNOSIS — F17210 Nicotine dependence, cigarettes, uncomplicated: Secondary | ICD-10-CM | POA: Diagnosis not present

## 2015-03-22 DIAGNOSIS — L97312 Non-pressure chronic ulcer of right ankle with fat layer exposed: Secondary | ICD-10-CM | POA: Diagnosis not present

## 2015-03-22 DIAGNOSIS — I872 Venous insufficiency (chronic) (peripheral): Secondary | ICD-10-CM | POA: Diagnosis not present

## 2015-03-28 DIAGNOSIS — I872 Venous insufficiency (chronic) (peripheral): Secondary | ICD-10-CM | POA: Diagnosis not present

## 2015-03-28 DIAGNOSIS — L97312 Non-pressure chronic ulcer of right ankle with fat layer exposed: Secondary | ICD-10-CM | POA: Diagnosis not present

## 2015-03-28 DIAGNOSIS — L97812 Non-pressure chronic ulcer of other part of right lower leg with fat layer exposed: Secondary | ICD-10-CM | POA: Diagnosis not present

## 2015-04-15 DIAGNOSIS — R69 Illness, unspecified: Secondary | ICD-10-CM | POA: Diagnosis not present

## 2015-04-24 ENCOUNTER — Other Ambulatory Visit (HOSPITAL_COMMUNITY): Payer: Self-pay | Admitting: Bariatrics

## 2015-04-27 DIAGNOSIS — L259 Unspecified contact dermatitis, unspecified cause: Secondary | ICD-10-CM | POA: Diagnosis not present

## 2015-04-27 DIAGNOSIS — F17210 Nicotine dependence, cigarettes, uncomplicated: Secondary | ICD-10-CM | POA: Diagnosis not present

## 2015-05-02 DIAGNOSIS — S60522D Blister (nonthermal) of left hand, subsequent encounter: Secondary | ICD-10-CM | POA: Diagnosis not present

## 2015-05-02 DIAGNOSIS — L089 Local infection of the skin and subcutaneous tissue, unspecified: Secondary | ICD-10-CM | POA: Diagnosis not present

## 2015-05-08 DIAGNOSIS — I878 Other specified disorders of veins: Secondary | ICD-10-CM | POA: Diagnosis not present

## 2015-05-08 DIAGNOSIS — F17210 Nicotine dependence, cigarettes, uncomplicated: Secondary | ICD-10-CM | POA: Diagnosis not present

## 2015-05-08 DIAGNOSIS — G4733 Obstructive sleep apnea (adult) (pediatric): Secondary | ICD-10-CM | POA: Diagnosis not present

## 2015-05-10 ENCOUNTER — Ambulatory Visit (HOSPITAL_COMMUNITY)
Admission: RE | Admit: 2015-05-10 | Discharge: 2015-05-10 | Disposition: A | Discharge status: Home / Self Care | Payer: Commercial Managed Care - HMO | Source: Ambulatory Visit | Attending: Bariatrics | Admitting: Bariatrics

## 2015-05-10 DIAGNOSIS — R0602 Shortness of breath: Secondary | ICD-10-CM | POA: Insufficient documentation

## 2015-05-10 DIAGNOSIS — K219 Gastro-esophageal reflux disease without esophagitis: Secondary | ICD-10-CM | POA: Diagnosis not present

## 2015-05-10 DIAGNOSIS — R932 Abnormal findings on diagnostic imaging of liver and biliary tract: Secondary | ICD-10-CM | POA: Diagnosis not present

## 2015-05-10 DIAGNOSIS — Z01818 Encounter for other preprocedural examination: Secondary | ICD-10-CM | POA: Diagnosis not present

## 2015-05-10 DIAGNOSIS — K224 Dyskinesia of esophagus: Secondary | ICD-10-CM | POA: Diagnosis not present

## 2015-05-15 DIAGNOSIS — R69 Illness, unspecified: Secondary | ICD-10-CM | POA: Diagnosis not present

## 2015-05-20 DIAGNOSIS — F4323 Adjustment disorder with mixed anxiety and depressed mood: Secondary | ICD-10-CM | POA: Diagnosis not present

## 2015-05-29 DIAGNOSIS — G4733 Obstructive sleep apnea (adult) (pediatric): Secondary | ICD-10-CM | POA: Diagnosis not present

## 2015-06-10 DIAGNOSIS — G8929 Other chronic pain: Secondary | ICD-10-CM | POA: Diagnosis not present

## 2015-06-10 DIAGNOSIS — I890 Lymphedema, not elsewhere classified: Secondary | ICD-10-CM | POA: Diagnosis not present

## 2015-06-13 DIAGNOSIS — F1120 Opioid dependence, uncomplicated: Secondary | ICD-10-CM | POA: Diagnosis not present

## 2015-06-13 DIAGNOSIS — R69 Illness, unspecified: Secondary | ICD-10-CM | POA: Diagnosis not present

## 2015-06-15 DIAGNOSIS — F1120 Opioid dependence, uncomplicated: Secondary | ICD-10-CM | POA: Diagnosis not present

## 2015-06-17 DIAGNOSIS — F1120 Opioid dependence, uncomplicated: Secondary | ICD-10-CM | POA: Diagnosis not present

## 2015-06-19 DIAGNOSIS — F1120 Opioid dependence, uncomplicated: Secondary | ICD-10-CM | POA: Diagnosis not present

## 2015-06-20 ENCOUNTER — Ambulatory Visit: Payer: Commercial Managed Care - HMO | Attending: Internal Medicine

## 2015-06-20 DIAGNOSIS — G4733 Obstructive sleep apnea (adult) (pediatric): Secondary | ICD-10-CM | POA: Diagnosis not present

## 2015-06-20 DIAGNOSIS — Z6841 Body Mass Index (BMI) 40.0 and over, adult: Secondary | ICD-10-CM | POA: Diagnosis not present

## 2015-06-20 DIAGNOSIS — G4739 Other sleep apnea: Secondary | ICD-10-CM | POA: Insufficient documentation

## 2015-06-22 DIAGNOSIS — F1120 Opioid dependence, uncomplicated: Secondary | ICD-10-CM | POA: Diagnosis not present

## 2015-06-24 DIAGNOSIS — F1120 Opioid dependence, uncomplicated: Secondary | ICD-10-CM | POA: Diagnosis not present

## 2015-06-26 DIAGNOSIS — I878 Other specified disorders of veins: Secondary | ICD-10-CM | POA: Diagnosis not present

## 2015-06-26 DIAGNOSIS — F1120 Opioid dependence, uncomplicated: Secondary | ICD-10-CM | POA: Diagnosis not present

## 2015-06-26 DIAGNOSIS — G4733 Obstructive sleep apnea (adult) (pediatric): Secondary | ICD-10-CM | POA: Diagnosis not present

## 2015-06-29 DIAGNOSIS — F1120 Opioid dependence, uncomplicated: Secondary | ICD-10-CM | POA: Diagnosis not present

## 2015-07-01 DIAGNOSIS — F1120 Opioid dependence, uncomplicated: Secondary | ICD-10-CM | POA: Diagnosis not present

## 2015-07-03 DIAGNOSIS — F1120 Opioid dependence, uncomplicated: Secondary | ICD-10-CM | POA: Diagnosis not present

## 2015-07-05 DIAGNOSIS — F1120 Opioid dependence, uncomplicated: Secondary | ICD-10-CM | POA: Diagnosis not present

## 2015-07-08 ENCOUNTER — Other Ambulatory Visit: Payer: Self-pay

## 2015-07-08 DIAGNOSIS — F1120 Opioid dependence, uncomplicated: Secondary | ICD-10-CM | POA: Diagnosis not present

## 2015-07-08 NOTE — Patient Outreach (Signed)
[place] [facility]) Care Management  07/08/2015  [name] November 15, 1960 [id number]   SUBJECTIVE; Telephone call to patient regarding [employer] direct referral. HIPAA verified with patient. Discussed [facility] care management services. Patient verbalized agreement for [facility] care management nurse to follow up / assist with [facility] concerns.  Patient states he is having difficulty with  Coverage for the CPAP equipment he needs. Patient states he was referred for a sleep study to an in plan provider with [facility].  Patient states due to the results of the sleep study he needs a CPAP machine. Patient states his [employer] is telling him the CPAP equipment will not be covered due to the provider being non participating with his plan. Patient states the request for his CPAP was sent to [facility] at [phone] who is not a participating  Provider. .  Patient states there is also some confusion regarding [employer] being his primary and sole insurance carrier. Patient states he is concerned that the bariatric Dr's office may or may not get paid due to confusion regarding his insurance.  Patient reports he had a stress test done at [facility] and he is unsure how to get the results to Dr. Bernette Mayers office. Patient voiced his frustration regarding all of the things that have to be done. Patient states he has been unsure of what to do.   This RNCM contacted Dr. Bernette Mayers office and spoke with Lennette Bihari patients case manager for bariactic services. Notified Lennette Bihari that patient was informed that [facility] is an out of plan provider for patient and that the CPAP equipment ordered would not be covered. Lennette Bihari states he will send order for CPAP [facility] to [facility].  Lennette Bihari states patient also needs cardiac clearance as well blood work before proceeding with surgery.   RNCM contacted patients primary MD office and left message with Daisy, Dr. Janace Aris nurse regarding need  for patient to have cardiac clearance and blood work per Dr. Bernette Mayers office. Requested return call.  RNCM contacted patient to inform him that CPAP equipment order was being sent to [facility]. Patient was also informed that this RNCM would be following up with his primary MD regarding cardiac clearance referral and need for lab work.  Patient expressed his appreciation for assisting him.  ASSESSMENT: Direct [employer] Referral  For patient support and coordination of needs.  Patient is seeing Dr. Duke Salvia [facility] [phone] for bariatric surgery.  PLAN: RNCM will await return call from Ms. Trudee Kuster with Dr. Bernette Mayers office and [facility] with Dr. Janace Aris office to continue assisting patient with care coordination needs. If not return call within 2 business days will attempt outreach to Ms.Trudee Kuster and [place].

## 2015-07-09 ENCOUNTER — Ambulatory Visit: Payer: Self-pay

## 2015-07-10 ENCOUNTER — Other Ambulatory Visit: Payer: Self-pay

## 2015-07-10 DIAGNOSIS — F1120 Opioid dependence, uncomplicated: Secondary | ICD-10-CM | POA: Diagnosis not present

## 2015-07-10 NOTE — Patient Outreach (Signed)
[place] [facility]) Care Management  07/10/2015  Olie Lizak 1960-05-06 [id number]  Telephone call to patients primary MD office.  Unable to reach.  Office is closed for lunch.  Telephone call to Ms. Trudee Kuster with Dr. Bernette Mayers office. Unable to reach.  Message left with [facility] for call back.  PLAN:  RNCM will attempt call back to patients primary MD office and Dr. Bernette Mayers office within 3 business days.   [name] RN,BSN,CCM [facility] Telephonic  [phone]

## 2015-07-10 NOTE — Patient Outreach (Addendum)
[place] [facility]) Care Management  07/10/2015  Tobiah Heckaman 09-Jun-1960 [id number]  Telephone call to Dr Janace Aris office. Voice message left with Dr. Janace Aris nurse [name] for return call to discuss care coordination needs.  Telephone call to Dr.Tyner's office. Voice message left for Ms. Trudee Kuster requesting return call.  PLAN:  Will await call back from [facility] and Ms.Burr. If no return call will attempt call within 1 week.  [name] RN,BSN,CCM [facility] Telephonic  [phone]

## 2015-07-11 ENCOUNTER — Other Ambulatory Visit: Payer: Self-pay

## 2015-07-11 NOTE — Patient Outreach (Signed)
[place] [facility]) Care Management  07/11/2015  [name] 1960-05-07 [id number]   Received return call from [facility] with Dr. Janace Aris office stating referral has been made to cardiologist for patient for cardiac clearance.  States patient should be receiving a call from cardiology office to arrange appointment date. [name] states patient does not have any recent labs on record with Dr. Lin Landsman.   RNCM contacted Lennette Bihari with Dr. Bernette Mayers office. Notified Lennette Bihari that cardiology referral has been made by Dr. Janace Aris office for patient.  Also, notified Lennette Bihari that Dr. Janace Aris office does not have any recent lab work for patient on record.  Lennette Bihari faxed order to [facility] at number [phone] for patient to have lab work completed.   RNCM contacted patient and notified him of cardiology referral by primary MD and lab work to be done ordered by Dr. Bernette Mayers office. Per Lennette Bihari with Dr. Bernette Mayers office, instructed patient to go to [place] [facility] area to have lab work done. Patient informed no appointment was needed. Patient verbalized understanding and appreciation with helping to coordinate services.  Requested patient notify this RNCM when he has scheduled appointment with cardiologist, received contact from [facility] regarding his CPAP equipment, and completed Lab work at [employer]. Direct phone number for this RNCM given to patient. Patient verbally agreed to contact this RNCM with requested information.   ASSESSMENT:  Assistance with care coordination needs for bariatric surgery  PLAN: RNCM will follow up with patient within 1 week.   [name] RN,BSN,CCM [facility] Telephonic  [phone]

## 2015-07-12 DIAGNOSIS — F1120 Opioid dependence, uncomplicated: Secondary | ICD-10-CM | POA: Diagnosis not present

## 2015-07-17 ENCOUNTER — Other Ambulatory Visit: Payer: Self-pay

## 2015-07-17 NOTE — Patient Outreach (Signed)
[facility]) Care Management  07/17/2015  Ezekio Hayton 1960/10/07 [id number]   SUBJECTIVE;  Returning telephone call to patient. HIPAA verified with patient.  Patient states he is very frustrated with [employer] and the fact that they will not pay the bills for Dr Bernette Mayers office. Patient states, "I want bariatric surgery really bad and I don't understand why I'm having so much problem with [employer] paying for the doctor bills when he is in plan." Patient states he has not gotten the lab work done yet because he doesn't want to keep running up doctor bills. Patient states he has his cardiology appointment for cardiac clearance scheduled for 08/08/15.  RNCM informed patient that she would attempt to contact Ms. Trudee Kuster again. RNCM called Dr. Bernette Mayers office. Spoke with receptionist who states Ms. Trudee Kuster has left for the day.  RNCM left voice message for Ms. Trudee Kuster.     PLAN:  RNCM will await to hear from Ms. Trudee Kuster.  If no response RNCM will attempt to contact office Manager for Dr. Adline Potter.   [name] RN,BSN,CCM [facility] Telephonic  [phone]

## 2015-07-18 ENCOUNTER — Ambulatory Visit: Payer: Self-pay

## 2015-07-19 ENCOUNTER — Other Ambulatory Visit: Payer: Self-pay

## 2015-07-19 NOTE — Patient Outreach (Signed)
[place] [facility]) Care Management  07/19/2015  Qais Wesemann 06-10-1960 [id number]  Telephone call to Dr. Bernette Mayers office to speak with office manager. Receptionist Roanna Epley states office has Sales promotion account executive, Freight forwarder.  RNCM left voice message with Butch Penny, Sales promotion account executive for return call.  Telephone call to patient.  Unable to reach. HIPAA compliant voice message left with call back phone number.   PLAN:  RNCM will await follow up from Sales promotion account executive.   [name] RN,BSN,CCM [facility] Telephonic  [phone]

## 2015-07-19 NOTE — Patient Outreach (Signed)
[place] [facility]) Care Management  07/19/2015  [name] [birth year] [id number]   SUBJECTIVE:  Telephone call received from Clair Gulling with Dr. Bernette Mayers office. Ms. Trudee Kuster states patient was preauthorized for bariatric surgery on 02/07/16.  Ms. Trudee Kuster states preauthorization was completed with Apolonio Schneiders with [facility] and patients precertification number was [id number].   Ms. Trudee Kuster states patient was seen by Dr. Duke Salvia on 02/27/15.  Ms. Trudee Kuster states office visit was denied by [facility] per  Evidence of benefit  due to: no record of referral.  Ms. Trudee Kuster states per [facility] no referral was needed if physician is a participating provider. Ms. Trudee Kuster states Dr. Duke Salvia is a participating provider.  Ms. Trudee Kuster states patient was seen by Dr. Duke Salvia on 05/03/15.  Ms. Trudee Kuster states visit was denied per  Evidence of benefit  due to:  payor on file not, not the responsible payor. Ms. Trudee Kuster states patient was seen at Dr. Bernette Mayers office by the nutritionist on 3/0/17.  Ms Trudee Kuster states visit was denied  per  Evidence of benefit  due to:  Payor on file, not the responsible payor. Ms. Trudee Kuster states patient was seen by Dr. Duke Salvia on 06/26/15.  Ms. Trudee Kuster states this claim is still out.  Ms. Trudee Kuster states their office does not contact the insurance company.  Ms. Trudee Kuster states the patient has been informed of the denials and denial reasons.  RNCM attempted call to patient.  Unable to reach patient. HIPAA compliant voice message left with call back phone number.    PLAN: RNCM will await return call from patient. If no return call will attempt to contact patient.   [name] RN,BSN,CCM [facility] Telephonic  [phone]

## 2015-07-22 ENCOUNTER — Ambulatory Visit: Payer: Self-pay

## 2015-07-23 ENCOUNTER — Other Ambulatory Visit: Payer: Self-pay

## 2015-07-23 NOTE — Patient Outreach (Signed)
[place] [facility]) Care Management  07/23/2015  Narada Frees [birth year] [id number]   Second telephone call to patient regarding [employer]back referral. Unable to reach patient.  HIPAA compliant voice message left with call back phone number.   PLAN;  RNCM will attempt 3rd telephone outreach to patient within 1 week.   [name] RN,BSN,CCM [facility] Telephonic  [phone]

## 2015-07-25 ENCOUNTER — Other Ambulatory Visit: Payer: Self-pay

## 2015-08-05 NOTE — Patient Outreach (Addendum)
  [place] [facility]) Care Management  [place]  08/07/2015   [name] 12-Jun-1960 [id number]  Subjective: Late entry for 07/25/15 SUBJECTIVE; Telephone call to patient regarding  [employer] referral.  HIPAA verified with patient. Patient states he has not received any additional feedback regarding insurance coverage for his Dr. Visits with Dr. Duke Salvia. Patient states he has not gotten his labs done that were ordered by Dr. Duke Salvia.  Patient reports he has a follow up visit with his cardiologist on 4/26/ 17. Patient states he will get his lab work done at that time.  Patient states his main concern now is getting his medical bills covered by his [place] insurance so that he can proceed on with the bariatric surgery. RNCM informed patient she would attempt outreach to [facility] to discuss his concerns.   Objective: see assessment  Encounter Medications:  Outpatient Encounter Prescriptions as of 07/25/2015  Medication Sig  . furosemide (LASIX) 40 MG tablet Take 1 tablet (40 mg total) by mouth daily.  . methadone (DOLOPHINE) 10 MG tablet Take 17 tablets (170 mg total) by mouth daily. (Patient taking differently: Take 180 mg by mouth daily. )  . metroNIDAZOLE (FLAGYL) 500 MG tablet Take 1 tablet (500 mg total) by mouth every 8 (eight) hours. (Patient not taking: Reported on 08/22/2014)  . potassium chloride SA (K-DUR,KLOR-CON) 10 MEQ tablet Take 1 tablet (10 mEq total) by mouth daily. (Patient not taking: Reported on 07/25/2015)  . sulfamethoxazole-trimethoprim (BACTRIM DS,SEPTRA DS) 800-160 MG per tablet Take 1 tablet by mouth 2 (two) times daily. (Patient not taking: Reported on 08/28/2014)   No facility-administered encounter medications on file as of 07/25/2015.    Functional Status:  In your present state of health, do you have any difficulty performing the following activities: 07/25/2015 08/12/2014  Hearing? N N  Vision? N N  Difficulty concentrating or making decisions? N N   Walking or climbing stairs? Y Y  Dressing or bathing? Y N  Doing errands, shopping? Y N  Preparing Food and eating ? N -  Using the Toilet? N -  In the past six months, have you accidently leaked urine? N -  Do you have problems with loss of bowel control? N -  Managing your Medications? N -  Managing your Finances? N -  Housekeeping or managing your Housekeeping? N -    Fall/Depression Screening: PHQ 2/9 Scores 07/25/2015 08/28/2014 08/22/2014 07/24/2013  PHQ - 2 Score 1 0 0 0   Fall Risk  07/25/2015 08/28/2014 08/22/2014  Falls in the past year? Yes No No  Number falls in past yr: 2 or more - -  Injury with Fall? No - -  Risk Factor Category  High Fall Risk - -  Risk for fall due to : History of fall(s) - -  Follow up Falls evaluation completed - -   Assessment: ASSESSMENT: Care coordination to assist patient with completing preliminary requirements for bariatric surgery.  Assistance to patient with health care navigation.   Plan: PLAN:  RNCM will follow up with patient within 2 weeks.   [name] RN,BSN,CCM [facility] Telephonic  [phone]

## 2015-08-07 ENCOUNTER — Other Ambulatory Visit
Admission: RE | Admit: 2015-08-07 | Discharge: 2015-08-07 | Disposition: A | Discharge status: Home / Self Care | Payer: Commercial Managed Care - HMO | Source: Ambulatory Visit | Attending: Bariatrics | Admitting: Bariatrics

## 2015-08-07 ENCOUNTER — Other Ambulatory Visit: Payer: Self-pay

## 2015-08-07 DIAGNOSIS — E559 Vitamin D deficiency, unspecified: Secondary | ICD-10-CM | POA: Diagnosis not present

## 2015-08-07 DIAGNOSIS — G4733 Obstructive sleep apnea (adult) (pediatric): Secondary | ICD-10-CM | POA: Diagnosis not present

## 2015-08-07 DIAGNOSIS — I878 Other specified disorders of veins: Secondary | ICD-10-CM | POA: Diagnosis not present

## 2015-08-07 DIAGNOSIS — R638 Other symptoms and signs concerning food and fluid intake: Secondary | ICD-10-CM | POA: Insufficient documentation

## 2015-08-07 LAB — COMPREHENSIVE METABOLIC PANEL
ALT: 34 U/L (ref 17–63)
AST: 32 U/L (ref 15–41)
Albumin: 4.4 g/dL (ref 3.5–5.0)
Alkaline Phosphatase: 49 U/L (ref 38–126)
Anion gap: 8 (ref 5–15)
BILIRUBIN TOTAL: 0.4 mg/dL (ref 0.3–1.2)
BUN: 13 mg/dL (ref 6–20)
CO2: 27 mmol/L (ref 22–32)
CREATININE: 0.61 mg/dL (ref 0.61–1.24)
Calcium: 9.2 mg/dL (ref 8.9–10.3)
Chloride: 103 mmol/L (ref 101–111)
Glucose, Bld: 121 mg/dL — ABNORMAL HIGH (ref 65–99)
POTASSIUM: 3.9 mmol/L (ref 3.5–5.1)
Sodium: 138 mmol/L (ref 135–145)
TOTAL PROTEIN: 8 g/dL (ref 6.5–8.1)

## 2015-08-07 LAB — CBC WITH DIFFERENTIAL/PLATELET
Basophils Absolute: 0 10*3/uL (ref 0–0.1)
Basophils Relative: 0 %
EOS PCT: 1 %
Eosinophils Absolute: 0.1 10*3/uL (ref 0–0.7)
HCT: 36.4 % — ABNORMAL LOW (ref 40.0–52.0)
Hemoglobin: 12.3 g/dL — ABNORMAL LOW (ref 13.0–18.0)
LYMPHS ABS: 1.4 10*3/uL (ref 1.0–3.6)
LYMPHS PCT: 24 %
MCH: 28.8 pg (ref 26.0–34.0)
MCHC: 33.8 g/dL (ref 32.0–36.0)
MCV: 85.2 fL (ref 80.0–100.0)
MONO ABS: 0.5 10*3/uL (ref 0.2–1.0)
MONOS PCT: 9 %
Neutro Abs: 4 10*3/uL (ref 1.4–6.5)
Neutrophils Relative %: 66 %
PLATELETS: 180 10*3/uL (ref 150–440)
RBC: 4.28 MIL/uL — AB (ref 4.40–5.90)
RDW: 14.2 % (ref 11.5–14.5)
WBC: 6.1 10*3/uL (ref 3.8–10.6)

## 2015-08-07 LAB — PROTIME-INR
INR: 1.03
Prothrombin Time: 13.7 seconds (ref 11.4–15.0)

## 2015-08-07 LAB — TSH: TSH: 2.003 u[IU]/mL (ref 0.350–4.500)

## 2015-08-07 LAB — HEMOGLOBIN A1C: Hgb A1c MFr Bld: 6.1 % — ABNORMAL HIGH (ref 4.0–6.0)

## 2015-08-07 LAB — APTT: APTT: 30 s (ref 24–36)

## 2015-08-07 LAB — FERRITIN: FERRITIN: 58 ng/mL (ref 24–336)

## 2015-08-07 LAB — FOLATE: Folate: 42 ng/mL (ref >5.9)

## 2015-08-07 LAB — VITAMIN B12: Vitamin B-12: 304 pg/mL (ref 180–914)

## 2015-08-07 LAB — PREALBUMIN: PREALBUMIN: 20.4 mg/dL (ref 18–38)

## 2015-08-07 LAB — IRON: Iron: 64 ug/dL (ref 45–182)

## 2015-08-07 LAB — MAGNESIUM: Magnesium: 2 mg/dL (ref 1.7–2.4)

## 2015-08-07 NOTE — Patient Outreach (Signed)
[place] [facility]) Care Management  08/07/2015  Ruslan Brotman 01/10/1961 [id number]   Received voice mail message from patient stating his bills have gotten paid by [facility] to Dr. Duke Salvia.  Patient voiced his appreciation regarding the assistance provided by this RNCM. Patient requested a return call. RNCM attempted to contact patient today 08/07/15.  Unable to reach patient. HIPAA compliant voice message left with call back phone number.   PLAN;  RNCM will attempt to reach patient within 1 week.  [name] RN,BSN,CCM [facility] Telephonic  [phone]

## 2015-08-08 DIAGNOSIS — R0989 Other specified symptoms and signs involving the circulatory and respiratory systems: Secondary | ICD-10-CM | POA: Diagnosis not present

## 2015-08-08 DIAGNOSIS — Z01818 Encounter for other preprocedural examination: Secondary | ICD-10-CM | POA: Diagnosis not present

## 2015-08-08 DIAGNOSIS — F17200 Nicotine dependence, unspecified, uncomplicated: Secondary | ICD-10-CM | POA: Diagnosis not present

## 2015-08-08 DIAGNOSIS — I10 Essential (primary) hypertension: Secondary | ICD-10-CM | POA: Diagnosis not present

## 2015-08-08 DIAGNOSIS — M7989 Other specified soft tissue disorders: Secondary | ICD-10-CM | POA: Diagnosis not present

## 2015-08-08 DIAGNOSIS — Z87891 Personal history of nicotine dependence: Secondary | ICD-10-CM | POA: Insufficient documentation

## 2015-08-08 LAB — H. PYLORI ANTIBODY, IGG

## 2015-08-09 LAB — PTH, INTACT AND CALCIUM
Calcium, Total (PTH): 9.3 mg/dL (ref 8.7–10.2)
PTH: 40 pg/mL (ref 15–65)

## 2015-08-09 LAB — VITAMIN D 25 HYDROXY (VIT D DEFICIENCY, FRACTURES): Vit D, 25-Hydroxy: 20.2 ng/mL — ABNORMAL LOW (ref 30.0–100.0)

## 2015-08-09 LAB — ZINC: ZINC: 85 ug/dL (ref 56–134)

## 2015-08-09 LAB — COPPER, SERUM: Copper: 128 ug/dL (ref 72–166)

## 2015-08-09 LAB — VITAMIN B1: VITAMIN B1 (THIAMINE): 179 nmol/L (ref 66.5–200.0)

## 2015-08-09 LAB — VITAMIN A: Vitamin A (Retinoic Acid): 43 ug/dL (ref 24–85)

## 2015-08-09 LAB — VITAMIN K1, SERUM: VITAMIN K1: 0.36 ng/mL (ref 0.13–1.88)

## 2015-08-09 LAB — H. PYLORI BREATH TEST: H. PYLORI UBIT: NEGATIVE

## 2015-08-09 LAB — VITAMIN E: Alpha-Tocopherol: 5.6 mg/L (ref 5.3–17.5)

## 2015-08-12 ENCOUNTER — Other Ambulatory Visit: Payer: Self-pay

## 2015-08-12 NOTE — Patient Outreach (Signed)
[facility]) Care Management  08/12/2015  [name] April 09, 1961 [id number]  SUBJECTIVE:  Telephone call to patient regarding care coordination follow up.  HIPAA verfied with patient.  Patient voiced appreciation to [facility] for help with health insurance concerns.  Patient states he saw the cardiologist on 08/07/15 but did not get clearance.  Patient states the cardiologist wants to run 2 more test. Patient states he is unsure of the name of the test.  Patient states he has an appointment with the cardiologist for the test on May 10.2017. Patient states he completed the requested blood work for Dr.Tyner. Patient states he has been paying for his medication out of pocket. Patient states he pay $14.00 per day for his methadone from the methadone clinic, [facility] [place]. Patient states the clinic does not take Medicare only Medicaid therefore he pays out of pocket first then requests reimbursement from his insurance company.  Patient states he has been denied consistently.  Patient states the denial reason has been improper procedure code.  Patient states he submitted the procedure code that was given to him by the Methadone clinic.  Patient states he appealed the denial decision with his insurance approximately 2 months and was told that he had submitted for a pharmacy claim instead of a Medical Claim.  Patient states he resubmitted under the medical claim has he was instructed but he has not received a response. Patient states his process of submitting the request to his [facility] insurance for reimbursement is to submit his medical record and a balance journal. Patient states he submitted request using procedure code H0020 with ICD.10 code 304.00 and/or F11-20.  Patient states he was also given code [phone] from the clinic but states he submitted claim using first code only.  Patient request assistance with reimbursement for methadone medication.  Patient confirms receiving  [facility] packet with consent form and education material. RNCM reviewed with patient completion of consent form. Patient states he will sign and send in the mail today. Patient states he has not been able to review educational material yet.  RNCM advised patient to keep follow up visit with cardiologist.  RNCM advised patient to review educational material  ASSESSMENT: Care coordination assistance for Bariatric surgery.  Patient goes to Methadone clinic daily at [facility] [place] - [phone].  PLAN:  RNCM will attempt to assist patient with information regarding reimbursement with his insurance for his Methadone. RNCM will review educational material with patient at next outreach.  [name] RN,BSN,CCM [facility] Telephonic  [phone]

## 2015-08-19 DIAGNOSIS — G4733 Obstructive sleep apnea (adult) (pediatric): Secondary | ICD-10-CM | POA: Diagnosis not present

## 2015-08-21 DIAGNOSIS — R0989 Other specified symptoms and signs involving the circulatory and respiratory systems: Secondary | ICD-10-CM | POA: Diagnosis not present

## 2015-08-21 DIAGNOSIS — I351 Nonrheumatic aortic (valve) insufficiency: Secondary | ICD-10-CM | POA: Diagnosis not present

## 2015-08-22 ENCOUNTER — Other Ambulatory Visit: Payer: Self-pay

## 2015-08-22 NOTE — Patient Outreach (Signed)
[place] [facility]) Care Management  08/22/2015  [name] 05/12/1960 [id number]  Telephone call to patient regarding care coordination follow up.  Unable to reach patient. HIPAA compliant voice message left with call back phone number.   PLAN; RNCM will attempt 2nd telephone outreach to patient within 2 weeks.   [name] RN,BSN,CCM [facility] Telephonic  [phone]

## 2015-08-29 ENCOUNTER — Other Ambulatory Visit: Payer: Self-pay

## 2015-09-04 ENCOUNTER — Other Ambulatory Visit: Payer: Self-pay

## 2015-09-04 NOTE — Patient Outreach (Signed)
[facility]) Care Management  09/04/2015  [name] Jun 15, 1960 [id number]  Second telephone outreach to patient. Unable to reach.  HIPAA compliant voice message left with call back phone number.   PLAN:  RNCM will attempt 3rd telephone call to patient within 2 weeks.  [name] RN,BSN,CCM [facility] Telephonic  [phone]

## 2015-09-10 ENCOUNTER — Other Ambulatory Visit: Payer: Self-pay

## 2015-09-10 ENCOUNTER — Ambulatory Visit: Payer: Self-pay

## 2015-09-10 NOTE — Patient Outreach (Signed)
[facility] [facility]) Care Management  09/10/2015  [name] 09-05-1960 [id number]   SUBJECTIVE; Telephone call to patient for follow.  Unable to reach patient. HIPAA  Compliant voice message left with call back phone number.   PLAN:  RNCM will attempt telephone call to patient within 1 week.   [name] RN,BSN,CCM [facility] Telephonic  [phone]

## 2015-09-16 ENCOUNTER — Other Ambulatory Visit: Payer: Self-pay

## 2015-09-16 NOTE — Patient Outreach (Signed)
[facility] [facility]) Care Management  [place]  09/16/2015   [name] [birth year] [id number]  Subjective: Telephone call to patient regarding follow up assessment.  HIPAA verified with patient. Patient states he is doing fine. Patient states he had his follow up visit with the cardiologist and the test done that the cardiologist ordered.  Patient states he received clearance for surgery from the cardiologist.  Patient states he is awaiting authorization from his insurance company for surgery.   RNCM notified patient she spoke with [facility] care management pharmacist and there was no medication assistance program for methadone.  Patient states Mcarthur Rossetti denies coverage for the methadone due to improper procedure code.  Patient states he has submitted the  Procedure codes the methadone clinic has given him and he still continues to be denied.  Patient states he is still able to obtain his methadone at this time.   Objective: n/a  Encounter Medications:  Outpatient Encounter Prescriptions as of 09/16/2015  Medication Sig Note  . methadone (DOLOPHINE) 10 MG tablet Take 17 tablets (170 mg total) by mouth daily. (Patient taking differently: Take 180 mg by mouth daily. ) 08/12/2015: Patient states he is taking Methadone 18m daily  . furosemide (LASIX) 40 MG tablet Take 1 tablet (40 mg total) by mouth daily. (Patient not taking: Reported on 08/12/2015)   . metroNIDAZOLE (FLAGYL) 500 MG tablet Take 1 tablet (500 mg total) by mouth every 8 (eight) hours. (Patient not taking: Reported on 08/22/2014)   . potassium chloride SA (K-DUR,KLOR-CON) 10 MEQ tablet Take 1 tablet (10 mEq total) by mouth daily. (Patient not taking: Reported on 07/25/2015)   . sulfamethoxazole-trimethoprim (BACTRIM DS,SEPTRA DS) 800-160 MG per tablet Take 1 tablet by mouth 2 (two) times daily. (Patient not taking: Reported on 08/28/2014)    No facility-administered encounter medications on file as of 09/16/2015.    Functional  Status:  In your present state of health, do you have any difficulty performing the following activities: 07/25/2015  Hearing? N  Vision? N  Difficulty concentrating or making decisions? N  Walking or climbing stairs? Y  Dressing or bathing? Y  Doing errands, shopping? Y  Preparing Food and eating ? N  Using the Toilet? N  In the past six months, have you accidently leaked urine? N  Do you have problems with loss of bowel control? N  Managing your Medications? N  Managing your Finances? N  Housekeeping or managing your Housekeeping? N    Fall/Depression Screening: PHQ 2/9 Scores 07/25/2015 08/28/2014 08/22/2014 07/24/2013  PHQ - 2 Score 1 0 0 0    THN CM Care Plan Problem One        Most Recent Value   Care Plan Problem One  Knowledge deficit regarding bariatric surgery preparation   Role Documenting the Problem One  Care Management Telephonic Coordinator   Care Plan for Problem One  Active   THN Long Term Goal (31-90 days)  Patient will report all preliminary requirements for bariatric surgery have been met   [facility]  07/[facility]/15   [facility] [place] Goal Met Date  09/16/15   Interventions for Problem One Long Term Goal  RNCM contacted Dr. TReginold Agentto have patients blood work order resent [place] primary MD to notify patient needed cardiac clearance visit with cardiologist.   [facility] ([facility])  Patient will report he had lab work completed within 30 days.  THN CM Short Term Goal #1 Start Date  07/25/15   [facility] CM Short Term Goal #1 Met Date  08/12/15 [patient reports lab work has been completed.]   Interventions for Short Term Goal #1  RNCM informed patient that his order for lab work [facility]  RNCM advised patient to have lab work completed,.     [facility] CM Care Plan Problem Two        Most Recent Value   Care Plan Problem Two  Unpaid medical bills to bariatric  surgeon   Role Documenting the Problem Two  Care Management Telephonic Coordinator   Care Plan for Problem Two  Active   THN CM Short Term Goal #1 (0-30 days)  Patient will report coverage/payment of bariatric surgeon office visits   THN CM Short Term Goal #1 Start Date  07/25/15   [facility] CM Short Term Goal #1 Met Date   08/12/15   Interventions for Short Term Goal #2   RNCM contacted Ms. Trudee Kuster with Dr. Bernette Mayers office to receive specifics of unpaid office visits. RNCM will attempt contact with [employer] to assist patient with coverage issue     [facility] CM Care Plan Problem Three        Most Recent Value   Care Plan Problem Three  potential for falls related to leg instability and obesity   Role Documenting the Problem Three  Care Management Telephonic Coordinator   Care Plan for Problem Three  Active   THN Long Term Goal (31-90) days  Patient will report no falls within 45 days   THN Long Term Goal Start Date  09/16/15   [facility] Long Term Goal Met Date  -- [ongoing goal]   Interventions for Problem Three Long Term Goal  RNCM advised patient to review EMMI education material for fall prevention.     Assessment:  Patient reports he has completed all preliminary testing for bariatric surgery.  Currently awaiting authorization from [employer]: RNCM will follow up with patient within 1 month.   [name] RN,BSN,CCM [facility] Telephonic  [phone]

## 2015-09-19 DIAGNOSIS — G4733 Obstructive sleep apnea (adult) (pediatric): Secondary | ICD-10-CM | POA: Diagnosis not present

## 2015-09-19 DIAGNOSIS — G4737 Central sleep apnea in conditions classified elsewhere: Secondary | ICD-10-CM | POA: Diagnosis not present

## 2015-09-25 DIAGNOSIS — Z9911 Dependence on respirator [ventilator] status: Secondary | ICD-10-CM | POA: Diagnosis not present

## 2015-09-25 DIAGNOSIS — I878 Other specified disorders of veins: Secondary | ICD-10-CM | POA: Diagnosis not present

## 2015-09-25 DIAGNOSIS — G8918 Other acute postprocedural pain: Secondary | ICD-10-CM | POA: Diagnosis not present

## 2015-09-25 DIAGNOSIS — G4733 Obstructive sleep apnea (adult) (pediatric): Secondary | ICD-10-CM | POA: Diagnosis not present

## 2015-09-25 DIAGNOSIS — Z9989 Dependence on other enabling machines and devices: Secondary | ICD-10-CM | POA: Insufficient documentation

## 2015-09-25 DIAGNOSIS — K449 Diaphragmatic hernia without obstruction or gangrene: Secondary | ICD-10-CM | POA: Diagnosis not present

## 2015-10-03 DIAGNOSIS — Z01818 Encounter for other preprocedural examination: Secondary | ICD-10-CM | POA: Diagnosis not present

## 2015-10-08 DIAGNOSIS — G4737 Central sleep apnea in conditions classified elsewhere: Secondary | ICD-10-CM | POA: Diagnosis not present

## 2015-10-08 DIAGNOSIS — G4733 Obstructive sleep apnea (adult) (pediatric): Secondary | ICD-10-CM | POA: Diagnosis not present

## 2015-10-12 HISTORY — PX: OTHER SURGICAL HISTORY: SHX169

## 2015-10-17 DIAGNOSIS — Z6841 Body Mass Index (BMI) 40.0 and over, adult: Secondary | ICD-10-CM | POA: Diagnosis not present

## 2015-10-17 DIAGNOSIS — G4733 Obstructive sleep apnea (adult) (pediatric): Secondary | ICD-10-CM | POA: Diagnosis not present

## 2015-10-17 DIAGNOSIS — K449 Diaphragmatic hernia without obstruction or gangrene: Secondary | ICD-10-CM | POA: Diagnosis not present

## 2015-10-17 DIAGNOSIS — G4737 Central sleep apnea in conditions classified elsewhere: Secondary | ICD-10-CM | POA: Diagnosis not present

## 2015-10-17 DIAGNOSIS — G4730 Sleep apnea, unspecified: Secondary | ICD-10-CM | POA: Diagnosis not present

## 2015-10-17 DIAGNOSIS — F17210 Nicotine dependence, cigarettes, uncomplicated: Secondary | ICD-10-CM | POA: Diagnosis not present

## 2015-10-17 DIAGNOSIS — I440 Atrioventricular block, first degree: Secondary | ICD-10-CM | POA: Diagnosis not present

## 2015-10-17 DIAGNOSIS — F1120 Opioid dependence, uncomplicated: Secondary | ICD-10-CM | POA: Diagnosis not present

## 2015-10-17 DIAGNOSIS — I878 Other specified disorders of veins: Secondary | ICD-10-CM | POA: Diagnosis not present

## 2015-10-17 HISTORY — PX: GASTROPLASTY DUODENAL SWITCH: SHX1699

## 2015-10-21 ENCOUNTER — Other Ambulatory Visit: Payer: Self-pay

## 2015-10-21 NOTE — Patient Outreach (Signed)
[place] [facility]) Care Management  10/21/2015  [name] 10-Feb-1961 [id number]   Telephone call to patient for follow up. Unable to reach patient. HIPAA compliant voice message left with call back phone number.   PLAN:  RNCM will attempt 2nd telephone call within 2 weeks.   [name] RN,BSN,CCM [facility] Telephonic  [phone]

## 2015-10-21 NOTE — Patient Outreach (Signed)
[place]) Care Management  10/21/2015  [name] 1961-01-27 [id number]   Telephone call to patient regarding telephone assessment follow up.  Unable to reach patient or leave voice message.  Phone only rang.  PLAN; RNCM will attempt 2nd telephone outreach to patient within  1 week.   [name] RN,BSN,CCM [facility] Telephonic  [phone]

## 2015-10-22 ENCOUNTER — Other Ambulatory Visit: Payer: Self-pay

## 2015-10-22 NOTE — Patient Outreach (Signed)
[place] [facility]) Care Management  10/22/2015  [name] 01/24/1961 [id number]   CASE CLOSURE : 10/22/15 SUBJECTIVE;  Telephone call to patient regarding for follow up.  HIPAA verified with patient. Patient states he was approved and has had his gastric bypass surgery.  Patient states he had the gastric bypass surgery on 10/17/15.  Patient states she has done well so far other than a little pain at times. Patient denies any redness, swelling, or drainage at incision site. Patient states he has family support from his father who lives with him. Patient states he has his medications and is doing well overall.  Patient denies any falls. RNCM advised patient to continue to keep follow up appointments with his doctors. RNCM advised patient to take his medications as prescribed. RNCM reviewed signs and symptoms of infection with patient.  RNCM advised patient to report any signs of infection or unusual symptoms to doctor as soon as possible.   Patient denies any further nursing needs at this time.    PLAN;  RNCM will refer patient to [name] to close due to goals being met.  RNCM will send patients primary MD closure letter.   Marvin Plowman RN,BSN,CCM [facility] Telephonic  [phone]

## 2015-11-19 DIAGNOSIS — G4737 Central sleep apnea in conditions classified elsewhere: Secondary | ICD-10-CM | POA: Diagnosis not present

## 2015-11-19 DIAGNOSIS — G4733 Obstructive sleep apnea (adult) (pediatric): Secondary | ICD-10-CM | POA: Diagnosis not present

## 2016-03-12 DIAGNOSIS — R1031 Right lower quadrant pain: Secondary | ICD-10-CM | POA: Diagnosis not present

## 2016-03-12 DIAGNOSIS — N202 Calculus of kidney with calculus of ureter: Secondary | ICD-10-CM | POA: Diagnosis not present

## 2016-03-12 DIAGNOSIS — N201 Calculus of ureter: Secondary | ICD-10-CM | POA: Diagnosis not present

## 2016-03-12 DIAGNOSIS — M549 Dorsalgia, unspecified: Secondary | ICD-10-CM | POA: Diagnosis not present

## 2016-03-17 DIAGNOSIS — N200 Calculus of kidney: Secondary | ICD-10-CM | POA: Diagnosis not present

## 2016-03-17 DIAGNOSIS — G8929 Other chronic pain: Secondary | ICD-10-CM | POA: Diagnosis not present

## 2016-03-17 DIAGNOSIS — Z0000 Encounter for general adult medical examination without abnormal findings: Secondary | ICD-10-CM | POA: Diagnosis not present

## 2016-03-20 DIAGNOSIS — N200 Calculus of kidney: Secondary | ICD-10-CM | POA: Diagnosis not present

## 2016-03-20 DIAGNOSIS — N400 Enlarged prostate without lower urinary tract symptoms: Secondary | ICD-10-CM | POA: Diagnosis not present

## 2016-03-23 ENCOUNTER — Other Ambulatory Visit: Payer: Self-pay | Admitting: Urology

## 2016-03-30 ENCOUNTER — Encounter (HOSPITAL_COMMUNITY): Payer: Self-pay

## 2016-03-30 NOTE — Patient Instructions (Signed)
[name]  03/30/2016   Your procedure is scheduled on: 04/01/16    Report to [facility] Main  Entrance take [facility]  elevators to 3rd floor to  [place] at    12 noon  Call this number if you have problems the morning of surgery [phone]   Remember: ONLY 1 PERSON MAY GO WITH YOU TO SHORT STAY TO GET  READY MORNING OF YOUR SURGERY.  Do not eat food after midnite. May have clear liquids from 12 midnite until 0730 am morning of surgery then nothing by mouth.      Take these medicines the morning of surgery with A SIP OF WATER: Methadone                                 You may not have any metal on your body including hair pins and              piercings  Do not wear jewelry,  lotions, powders or perfumes, deodorant                         Men may shave face and neck.   Do not bring valuables to the hospital. [place].  Contacts, dentures or bridgework may not be worn into surgery.    CLEAR LIQUID DIET   Foods Allowed                                                                     Foods Excluded  Coffee and tea, regular and decaf                             liquids that you cannot  Plain Jell-O in any flavor                                             see through such as: Fruit ices (not with fruit pulp)                                     milk, soups, orange juice  Iced Popsicles                                    All solid food Carbonated beverages, regular and diet                                    Cranberry, grape and apple juices Sports drinks like Gatorade Lightly seasoned clear broth or consume(fat free) Sugar, honey syrup  Sample Menu Breakfast  Lunch                                     Supper Cranberry juice                    Beef broth                            Chicken broth Jell-O                                     Grape juice                            Apple juice Coffee or tea                        Jell-O                                      Popsicle                                                Coffee or tea                        Coffee or tea  _____________________________________________________________________       Patients discharged the day of surgery will not be allowed to drive home.  Name and phone number of your driver:  Special Instructions: N/A              Please read over the following fact sheets you were given: _____________________________________________________________________             [facility] - Preparing for Surgery Before surgery, you can play an important role.  Because skin is not sterile, your skin needs to be as free of germs as possible.  You can reduce the number of germs on your skin by washing with CHG (chlorahexidine gluconate) soap before surgery.  CHG is an antiseptic cleaner which kills germs and bonds with the skin to continue killing germs even after washing. Please DO NOT use if you have an allergy to CHG or antibacterial soaps.  If your skin becomes reddened/irritated stop using the CHG and inform your nurse when you arrive at Short Stay. Do not shave (including legs and underarms) for at least 48 hours prior to the first CHG shower.  You may shave your face/neck. Please follow these instructions carefully:  1.  Shower with CHG Soap the night before surgery and the  morning of Surgery.  2.  If you choose to wash your hair, wash your hair first as usual with your  normal  shampoo.  3.  After you shampoo, rinse your hair and body thoroughly to remove the  shampoo.                           4.  Use CHG as you would any other liquid soap.  You can apply  chg directly  to the skin and wash                       Gently with a scrungie or clean washcloth.  5.  Apply the CHG Soap to your body ONLY FROM THE NECK DOWN.   Do not use on face/ open                           Wound or open  sores. Avoid contact with eyes, ears mouth and genitals (private parts).                       Wash face,  Genitals (private parts) with your normal soap.             6.  Wash thoroughly, paying special attention to the area where your surgery  will be performed.  7.  Thoroughly rinse your body with warm water from the neck down.  8.  DO NOT shower/wash with your normal soap after using and rinsing off  the CHG Soap.                9.  Pat yourself dry with a clean towel.            10.  Wear clean pajamas.            11.  Place clean sheets on your bed the night of your first shower and do not  sleep with pets. Day of Surgery : Do not apply any lotions/deodorants the morning of surgery.  Please wear clean clothes to the hospital/surgery center.  FAILURE TO FOLLOW THESE INSTRUCTIONS MAY RESULT IN THE CANCELLATION OF YOUR SURGERY PATIENT SIGNATURE_________________________________  NURSE SIGNATURE__________________________________  ________________________________________________________________________

## 2016-03-31 ENCOUNTER — Encounter (HOSPITAL_COMMUNITY): Payer: Self-pay

## 2016-03-31 ENCOUNTER — Encounter (HOSPITAL_COMMUNITY)
Admission: RE | Admit: 2016-03-31 | Discharge: 2016-03-31 | Disposition: A | Discharge status: Home / Self Care | Payer: Commercial Managed Care - HMO | Source: Ambulatory Visit | Attending: Urology | Admitting: Urology

## 2016-03-31 DIAGNOSIS — I739 Peripheral vascular disease, unspecified: Secondary | ICD-10-CM | POA: Diagnosis not present

## 2016-03-31 DIAGNOSIS — N200 Calculus of kidney: Secondary | ICD-10-CM | POA: Diagnosis not present

## 2016-03-31 DIAGNOSIS — Z6839 Body mass index (BMI) 39.0-39.9, adult: Secondary | ICD-10-CM | POA: Diagnosis not present

## 2016-03-31 DIAGNOSIS — Z9884 Bariatric surgery status: Secondary | ICD-10-CM | POA: Diagnosis not present

## 2016-03-31 DIAGNOSIS — N400 Enlarged prostate without lower urinary tract symptoms: Secondary | ICD-10-CM | POA: Diagnosis not present

## 2016-03-31 DIAGNOSIS — J449 Chronic obstructive pulmonary disease, unspecified: Secondary | ICD-10-CM | POA: Diagnosis not present

## 2016-03-31 DIAGNOSIS — R011 Cardiac murmur, unspecified: Secondary | ICD-10-CM | POA: Diagnosis not present

## 2016-03-31 HISTORY — DX: Cardiac murmur, unspecified: R01.1

## 2016-03-31 HISTORY — DX: Personal history of urinary calculi: Z87.442

## 2016-03-31 LAB — BASIC METABOLIC PANEL
ANION GAP: 7 (ref 5–15)
BUN: 11 mg/dL (ref 6–20)
CALCIUM: 9 mg/dL (ref 8.9–10.3)
CO2: 29 mmol/L (ref 22–32)
Chloride: 103 mmol/L (ref 101–111)
Creatinine, Ser: 0.53 mg/dL — ABNORMAL LOW (ref 0.61–1.24)
GLUCOSE: 73 mg/dL (ref 65–99)
POTASSIUM: 3.8 mmol/L (ref 3.5–5.1)
SODIUM: 139 mmol/L (ref 135–145)

## 2016-03-31 LAB — CBC
HCT: 33.7 % — ABNORMAL LOW (ref 39.0–52.0)
HEMOGLOBIN: 11.5 g/dL — AB (ref 13.0–17.0)
MCH: 29.2 pg (ref 26.0–34.0)
MCHC: 34.1 g/dL (ref 30.0–36.0)
MCV: 85.5 fL (ref 78.0–100.0)
Platelets: 196 10*3/uL (ref 150–400)
RBC: 3.94 MIL/uL — ABNORMAL LOW (ref 4.22–5.81)
RDW: 13.5 % (ref 11.5–15.5)
WBC: 4.5 10*3/uL (ref 4.0–10.5)

## 2016-03-31 MED ORDER — CEFAZOLIN SODIUM 10 G IJ SOLR
3.0000 g | INTRAMUSCULAR | Status: AC
  Administered 2016-04-01: 3 g via INTRAVENOUS
  Filled 2016-03-31: qty 3

## 2016-03-31 NOTE — Progress Notes (Signed)
BMP done 03/31/16 faxed via EPIC to [place].

## 2016-03-31 NOTE — Progress Notes (Signed)
EKG-08/01/15- on chart  08/08/15- LOV- Cardiology on chart  Carotid 08/21/15- on chart  08/21/15- ECHO on chart

## 2016-04-01 ENCOUNTER — Ambulatory Visit (HOSPITAL_COMMUNITY): Payer: Commercial Managed Care - HMO | Admitting: Anesthesiology

## 2016-04-01 ENCOUNTER — Encounter (HOSPITAL_COMMUNITY)
Admission: RE | Disposition: A | Discharge status: Home / Self Care | Payer: Self-pay | Source: Ambulatory Visit | Attending: Urology

## 2016-04-01 ENCOUNTER — Ambulatory Visit (HOSPITAL_COMMUNITY)
Admission: RE | Admit: 2016-04-01 | Discharge: 2016-04-01 | Disposition: A | Discharge status: Home / Self Care | Payer: Commercial Managed Care - HMO | Source: Ambulatory Visit | Attending: Urology | Admitting: Urology

## 2016-04-01 ENCOUNTER — Encounter (HOSPITAL_COMMUNITY): Payer: Self-pay | Admitting: *Deleted

## 2016-04-01 DIAGNOSIS — Z466 Encounter for fitting and adjustment of urinary device: Secondary | ICD-10-CM | POA: Diagnosis not present

## 2016-04-01 DIAGNOSIS — N400 Enlarged prostate without lower urinary tract symptoms: Secondary | ICD-10-CM | POA: Diagnosis not present

## 2016-04-01 DIAGNOSIS — I739 Peripheral vascular disease, unspecified: Secondary | ICD-10-CM | POA: Insufficient documentation

## 2016-04-01 DIAGNOSIS — Z9884 Bariatric surgery status: Secondary | ICD-10-CM | POA: Insufficient documentation

## 2016-04-01 DIAGNOSIS — R011 Cardiac murmur, unspecified: Secondary | ICD-10-CM | POA: Insufficient documentation

## 2016-04-01 DIAGNOSIS — Z6839 Body mass index (BMI) 39.0-39.9, adult: Secondary | ICD-10-CM | POA: Diagnosis not present

## 2016-04-01 DIAGNOSIS — N200 Calculus of kidney: Secondary | ICD-10-CM | POA: Diagnosis not present

## 2016-04-01 DIAGNOSIS — J449 Chronic obstructive pulmonary disease, unspecified: Secondary | ICD-10-CM | POA: Diagnosis not present

## 2016-04-01 HISTORY — PX: CYSTOSCOPY/URETEROSCOPY/HOLMIUM LASER/STENT PLACEMENT: SHX6546

## 2016-04-01 SURGERY — CYSTOSCOPY/URETEROSCOPY/HOLMIUM LASER/STENT PLACEMENT
Anesthesia: General | Site: Ureter | Laterality: Bilateral

## 2016-04-01 MED ORDER — PROMETHAZINE HCL 25 MG/ML IJ SOLN: 6.2500 mg | INTRAMUSCULAR | Status: DC | PRN

## 2016-04-01 MED ORDER — HYDROCODONE-ACETAMINOPHEN 5-325 MG PO TABS: 1.0000 | ORAL_TABLET | Freq: Once | ORAL | Status: DC | PRN

## 2016-04-01 MED ORDER — MIDAZOLAM HCL 2 MG/2ML IJ SOLN
INTRAMUSCULAR | Status: AC
  Filled 2016-04-01: qty 2

## 2016-04-01 MED ORDER — GLYCOPYRROLATE 0.2 MG/ML IV SOSY
PREFILLED_SYRINGE | INTRAVENOUS | Status: DC | PRN
  Administered 2016-04-01: .3 mg via INTRAVENOUS

## 2016-04-01 MED ORDER — FENTANYL CITRATE (PF) 100 MCG/2ML IJ SOLN
INTRAMUSCULAR | Status: AC
  Filled 2016-04-01: qty 2

## 2016-04-01 MED ORDER — CEPHALEXIN 500 MG PO CAPS: 500.0000 mg | ORAL_CAPSULE | Freq: Three times a day (TID) | ORAL | 0 refills | Status: DC

## 2016-04-01 MED ORDER — HYDROCODONE-ACETAMINOPHEN 5-325 MG PO TABS: 1.0000 | ORAL_TABLET | ORAL | 0 refills | Status: DC | PRN

## 2016-04-01 MED ORDER — MIDAZOLAM HCL 2 MG/2ML IJ SOLN
INTRAMUSCULAR | Status: DC | PRN
  Administered 2016-04-01: 2 mg via INTRAVENOUS

## 2016-04-01 MED ORDER — FENTANYL CITRATE (PF) 100 MCG/2ML IJ SOLN
INTRAMUSCULAR | Status: DC | PRN
  Administered 2016-04-01: 100 ug via INTRAVENOUS

## 2016-04-01 MED ORDER — STERILE WATER FOR IRRIGATION IR SOLN
Status: DC | PRN
  Administered 2016-04-01: 500 mL

## 2016-04-01 MED ORDER — LACTATED RINGERS IV SOLN
INTRAVENOUS | Status: DC
  Administered 2016-04-01: 13:00:00 via INTRAVENOUS

## 2016-04-01 MED ORDER — HYDROMORPHONE HCL 1 MG/ML IJ SOLN
INTRAMUSCULAR | Status: AC
  Filled 2016-04-01: qty 1

## 2016-04-01 MED ORDER — SODIUM CHLORIDE 0.9 % IR SOLN
Status: DC | PRN
  Administered 2016-04-01: 4000 mL

## 2016-04-01 MED ORDER — IOPAMIDOL (ISOVUE-300) INJECTION 61%
INTRAVENOUS | Status: DC | PRN
  Administered 2016-04-01: 10 mL via URETHRAL

## 2016-04-01 MED ORDER — LIDOCAINE 2% (20 MG/ML) 5 ML SYRINGE
INTRAMUSCULAR | Status: DC | PRN
  Administered 2016-04-01: 100 mg via INTRAVENOUS

## 2016-04-01 MED ORDER — LACTATED RINGERS IV SOLN
INTRAVENOUS | Status: DC | PRN
  Administered 2016-04-01 (×2): via INTRAVENOUS

## 2016-04-01 MED ORDER — PROPOFOL 10 MG/ML IV BOLUS
INTRAVENOUS | Status: AC
  Filled 2016-04-01: qty 20

## 2016-04-01 MED ORDER — HYDROMORPHONE HCL 1 MG/ML IJ SOLN
0.2500 mg | INTRAMUSCULAR | Status: DC | PRN
  Administered 2016-04-01 (×4): 0.5 mg via INTRAVENOUS

## 2016-04-01 MED ORDER — PROPOFOL 10 MG/ML IV BOLUS
INTRAVENOUS | Status: DC | PRN
  Administered 2016-04-01 (×2): 200 mg via INTRAVENOUS

## 2016-04-01 MED ORDER — EPHEDRINE SULFATE-NACL 50-0.9 MG/10ML-% IV SOSY
PREFILLED_SYRINGE | INTRAVENOUS | Status: DC | PRN
  Administered 2016-04-01: 5 mg via INTRAVENOUS

## 2016-04-01 MED ORDER — MEPERIDINE HCL 50 MG/ML IJ SOLN: 6.2500 mg | INTRAMUSCULAR | Status: DC | PRN

## 2016-04-01 SURGICAL SUPPLY — 20 items
BAG URO CATCHER STRL LF (MISCELLANEOUS) ×4 IMPLANT
BASKET ZERO TIP NITINOL 2.4FR (BASKET) IMPLANT
CATH INTERMIT  6FR 70CM (CATHETERS) ×4 IMPLANT
CATH URET DUAL LUMEN 6-10FR 50 (CATHETERS) ×4 IMPLANT
CLOTH BEACON ORANGE TIMEOUT ST (SAFETY) ×4 IMPLANT
FIBER LASER FLEXIVA 365 (UROLOGICAL SUPPLIES) IMPLANT
FIBER LASER TRAC TIP (UROLOGICAL SUPPLIES) IMPLANT
GLOVE BIO SURGEON STRL SZ7.5 (GLOVE) ×4 IMPLANT
GLOVE BIOGEL PI IND STRL 6.5 (GLOVE) ×2 IMPLANT
GLOVE BIOGEL PI INDICATOR 6.5 (GLOVE) ×2
GLOVE SURG SS PI 6.5 STRL IVOR (GLOVE) ×4 IMPLANT
GOWN STRL REUS W/TWL LRG LVL3 (GOWN DISPOSABLE) ×8 IMPLANT
GUIDEWIRE ANG ZIPWIRE 038X150 (WIRE) ×4 IMPLANT
GUIDEWIRE STR DUAL SENSOR (WIRE) ×8 IMPLANT
MANIFOLD NEPTUNE II (INSTRUMENTS) ×4 IMPLANT
PACK CYSTO (CUSTOM PROCEDURE TRAY) ×4 IMPLANT
SHEATH ACCESS URETERAL 38CM (SHEATH) ×4 IMPLANT
STENT URET 6FRX26 CONTOUR (STENTS) ×4 IMPLANT
TUBING CONNECTING 10 (TUBING) ×3 IMPLANT
TUBING CONNECTING 10' (TUBING) ×1

## 2016-04-01 NOTE — H&P (Signed)
CC: I have kidney stones.  HPI: [name] is a 55 year-old male patient who was referred by Dr. Jacqlyn Larsen II, MD who is here for renal calculi.  The problem is on both sides. This is not his first kidney stone. He is not currently having flank pain, back pain, groin pain, nausea, vomiting, fever or chills. He has not caught a stone in his urine strainer since his symptoms began.   He has had eswl for treatment of his stones in the past.   Recently passed 1 mm right ureteral stone. Has 12 mm left lower pole stone. Also small stones in right kidney. Largest is 3 mm. He has had stones in the past treated with lithotripsy. He is very concerned of these stones may remove and cause pain. He is very interested in having surgical procedure to correct his stone burden.   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     CC: BPH New Patient  HPI: The patient states if he were to spend the rest of his life with his current urinary condition, he would be mixed.   Not interested in medication. More concerned about stones. Refusing rectal exam and PSA at this time.     AUA Symptom Score: More than 50% of the time he has the sensation of not emptying his bladder completely when finished urinating. He never has to urinate again less that two hours after he has finished urinating. Less than 50% of the time he has to start and stop again several times when he urinates. Less than 20% of the time he finds it difficult to postpone urination. Less than 20% of the time he has a weak urinary stream. More than 50% of the time he has to push or strain to begin urination. He has to get up to urinate 2 times from the time he goes to bed until the time he gets up in the morning.   Calculated AUA Symptom Score: 14    ALLERGIES: None   MEDICATIONS: Methadone Hcl 40 mg tablet, soluble     GU PSH: None   NON-GU PSH: Carpal Tunnel Surgery Gastroplasty Duodenal Switch    GU PMH: None   NON-GU PMH: Acute  gastric ulcer with hemorrhage Cardiac murmur, unspecified GERD Sleep Apnea    FAMILY HISTORY: Colon Cancer - Father Diabetes - Father leukemia - Mother   SOCIAL HISTORY: Marital Status: Divorced Current Smoking Status: Patient smokes.  Has never drank.  Patient's occupation is/was disabled.    REVIEW OF SYSTEMS:    GU Review Male:   Patient reports get up at night to urinate, stream starts and stops, trouble starting your stream, have to strain to urinate , and erection problems. Patient denies frequent urination, hard to postpone urination, burning/ pain with urination, leakage of urine, and penile pain.  Gastrointestinal (Upper):   Patient reports nausea and vomiting. Patient denies indigestion/ heartburn.  Gastrointestinal (Lower):   Patient reports diarrhea. Patient denies constipation.  Constitutional:   Patient denies fever, night sweats, weight loss, and fatigue.  Skin:   Patient denies skin rash/ lesion and itching.  Eyes:   Patient denies blurred vision and double vision.  Ears/ Nose/ Throat:   Patient reports sinus problems. Patient denies sore throat.  Hematologic/Lymphatic:   Patient denies swollen glands and easy bruising.  Cardiovascular:   Patient reports leg swelling. Patient denies chest pains.  Respiratory:   Patient denies cough and shortness of breath.  Endocrine:   Patient  denies excessive thirst.  Musculoskeletal:   Patient reports back pain and joint pain.   Neurological:   Patient denies headaches and dizziness.  Psychologic:   Patient denies depression and anxiety.   VITAL SIGNS:      03/20/2016 10:38 AM  Weight 290 lb / 131.54 kg  Height 72 in / 182.88 cm  BP 109/70 mmHg  Pulse 60 /min  BMI 39.3 kg/m  Notes: has lost 150 pounds after gastric bypass   MULTI-SYSTEM PHYSICAL EXAMINATION:    Constitutional: Well-nourished. No physical deformities. Normally developed. Good grooming.  Neck: Neck symmetrical, not swollen. Normal tracheal position.   Respiratory: No labored breathing, no use of accessory muscles.   Cardiovascular: Normal temperature, normal extremity pulses, no swelling, no varicosities.  Lymphatic: No enlargement of neck, axillae, groin.  Skin: No paleness, no jaundice, no cyanosis. No lesion, no ulcer, no rash.  Neurologic / Psychiatric: Oriented to time, oriented to place, oriented to person. No depression, no anxiety, no agitation.  Gastrointestinal: No mass, no tenderness, no rigidity, non obese abdomen.  Eyes: Normal conjunctivae. Normal eyelids.  Ears, Nose, Mouth, and Throat: Left ear no scars, no lesions, no masses. Right ear no scars, no lesions, no masses. Nose no scars, no lesions, no masses. Normal hearing. Normal lips.  Musculoskeletal: Normal gait and station of head and neck.     PAST DATA REVIEWED:  Source Of History:  Patient  X-Ray Review: C.T. Stone Protocol: Reviewed Films. Reviewed Report. Discussed With Patient.     PROCEDURES:          Urinalysis w/Scope Dipstick Dipstick Cont'd Micro  Color: Amber Bilirubin: Neg WBC/hpf: NS (Not Seen)  Appearance: Cloudy Ketones: Neg RBC/hpf: 10 - 20/hpf  Specific Gravity: 1.025 Blood: 3+ Bacteria: Rare (0-9/hpf)  pH: 6.0 Protein: Neg Cystals: NS (Not Seen)  Glucose: Neg Urobilinogen: 0.2 Casts: NS (Not Seen)    Nitrites: Neg Trichomonas: Not Present    Leukocyte Esterase: Neg Mucous: Present      Epithelial Cells: 0 - 5/hpf      Yeast: NS (Not Seen)      Sperm: Not Present    ASSESSMENT:      ICD-10 Details  1 GU:   Kidney Stone - N20.0   2   BPH w/o LUTS - N40.0    PLAN:           Orders Labs Urine Culture and Sensitivity          Schedule Procedure: Unspecified Date - Cysto Uretero Lithotripsy [id number], bilateral          Document Letter(s):  Created for Patient: Clinical Summary   Created for Jacqlyn Larsen II, MD    The patient and I talked about surgical treatment for their kidney stones. Etiologies of kidney stones including  dehydration, poor fluid intake, intake of well water, congenital renal disease, previous bowel surgery, idiopathic, and others were discussed with the patient.   Metabolic evaluation of kidney stone disease was discussed with the patient. Alternative treatment options including increased water intake, increased lemonade intake, and dietary moderation with calcium and oxalate containing foods were discussed with the patient in detail.   The risks, benefits, and some of the possible complications of surgical treatments including cystoscopy, ureteroscopy, renoscopy, laser lithotripsy, extracorporeal shock wave lithotripsy, stent insertion and others were discussed with the patient. All questions were answered.  The patient gave fully informed consent to proceed with a ureteroscopy with or without laser lithotripsy with stone extraction for  the treatment of their kidney stones.   The patient was given instructions to call for abdominal pain, pelvic pain, perirectal pain, nausea, vomiting, diarrhea, fever over 100 degrees F, chills, hematuria, dysuria, frequency, urgency, or urge incontinence.         Notes:   I discussed the patient that his best chance of being stone free with one procedure is with bilateral ureteroscopy. He does understand though it still may take more than one procedure. He is interested in proceeding with this. We discussed the risks and benefits as discussed above.

## 2016-04-01 NOTE — Anesthesia Preprocedure Evaluation (Addendum)
Anesthesia Evaluation  Patient identified by MRN, date of birth, ID band Patient awake    Reviewed: Allergy & Precautions, NPO status , Patient's Chart, lab work & pertinent test results  Airway Mallampati: II  TM Distance: >3 FB Neck ROM: Full    Dental  (+) Dental Advisory Given, Edentulous Upper, Edentulous Lower   Pulmonary COPD, former smoker,    Pulmonary exam normal breath sounds clear to auscultation       Cardiovascular + Peripheral Vascular Disease  Normal cardiovascular exam+ Valvular Problems/Murmurs  Rhythm:Regular Rate:Normal  Echo: normal LVSF   Neuro/Psych negative neurological ROS  negative psych ROS   GI/Hepatic negative GI ROS, Neg liver ROS,   Endo/Other  Morbid obesity  Renal/GU negative Renal ROS     Musculoskeletal negative musculoskeletal ROS (+)   Abdominal   Peds  Hematology negative hematology ROS (+)   Anesthesia Other Findings   Reproductive/Obstetrics                          Anesthesia Physical Anesthesia Plan  ASA: III  Anesthesia Plan: General   Post-op Pain Management:    Induction: Intravenous  Airway Management Planned: LMA  Additional Equipment:   Intra-op Plan:   Post-operative Plan: Extubation in OR  Informed Consent: I have reviewed the patients History and Physical, chart, labs and discussed the procedure including the risks, benefits and alternatives for the proposed anesthesia with the patient or authorized representative who has indicated his/her understanding and acceptance.   Dental advisory given  Plan Discussed with: CRNA  Anesthesia Plan Comments:         Anesthesia Quick Evaluation

## 2016-04-01 NOTE — Transfer of Care (Signed)
Immediate Anesthesia Transfer of Care Note  Patient: [name]  Procedure(s) Performed: Procedure(s): CYSTOSCOPY WITH RETROGRADE AND BILATERAL STENT PLACEMENT (Bilateral)  Patient Location: PACU  Anesthesia Type:General  Level of Consciousness: awake, alert  and oriented  Airway & Oxygen Therapy: Patient Spontanous Breathing and Patient connected to face mask oxygen  Post-op Assessment: Report given to RN and Post -op Vital signs reviewed and stable  Post vital signs: Reviewed and stable  Last Vitals: There were no vitals filed for this visit.  Last Pain: There were no vitals filed for this visit.    Patients Stated Pain Goal: 4 ([id number])  Complications: No apparent anesthesia complications

## 2016-04-01 NOTE — Anesthesia Postprocedure Evaluation (Signed)
Anesthesia Post Note  Patient: [name]  Procedure(s) Performed: Procedure(s) (LRB): CYSTOSCOPY WITH RETROGRADE AND  STENT PLACEMENT (Bilateral)  Patient location during evaluation: PACU Anesthesia Type: General Level of consciousness: sedated and patient cooperative Pain management: pain level controlled Vital Signs Assessment: post-procedure vital signs reviewed and stable Respiratory status: spontaneous breathing Cardiovascular status: stable Anesthetic complications: no       Last Vitals:  Vitals:   04/01/16 1608 04/01/16 1700  BP: 125/81 126/87  Pulse: 63 68  Resp: 16 14  Temp: 36.4 C 36.4 C    Last Pain:  Vitals:   04/01/16 1700  TempSrc: Oral  PainSc: 2                  Nolon Nations

## 2016-04-01 NOTE — Op Note (Signed)
Date of procedure: 04/01/16  Preoperative diagnosis:  1. Bilateral renal stones   Postoperative diagnosis:  1. Same   Procedure: 1. Cystoscopy 2. Bilateral retrograde pyelogram with interpretation 3. Bilateral ureteral stent placement 6 French by 26 cm  Surgeon: Baruch Gouty, MD  Anesthesia: General  Complications: None  Intraoperative findings: I was unable to safely advance the ureteral access sheath into the left ureter. Due to the significant stone burden on the left side, I did not want to remove the stone without ureteral access sheath through a stent was placed bilaterally instead with the plans to return after passive dilation of his ureters from the stents. Left retrograde polygrams showed a filling defect in the left lower pole consistent with his known 12 mm stone. Right retropyelogram was unremarkable.  EBL: None  Specimens: None  Drains: Bilateral 6 French by 26 cm double-J ureteral stents  Disposition: Stable to the postanesthesia care unit  Indication for procedure: The patient is a 55 y.o. male with a left lower pole 12 mm calculus as well as a 3-4 mm right renal calculus presents today for attempted stone removal.  After reviewing the management options for treatment, the patient elected to proceed with the above surgical procedure(s). We have discussed the potential benefits and risks of the procedure, side effects of the proposed treatment, the likelihood of the patient achieving the goals of the procedure, and any potential problems that might occur during the procedure or recuperation. Informed consent has been obtained.  Description of procedure: The patient was met in the preoperative area. All risks, benefits, and indications of the procedure were described in great detail. The patient consented to the procedure. Preoperative antibiotics were given. The patient was taken to the operative theater. General anesthesia was induced per the anesthesia service. The  patient was then placed in the dorsal lithotomy position and prepped and draped in the usual sterile fashion. A preoperative timeout was called.   A 21 French 30 cystoscope was inserted into the patient's bladder atraumatically per urethra. With the aid of a dual-lumen catheter, 2 sensor wires were placed the level of the left renal pelvis under fluoroscopy. Attempt was then made to place a ureteral access sheath over the sensor wire however it immediately buckled and met resistance in the distal left ureter. Attempts were aborted this time for access sheath placement. Due to the significant stone burden in the left lower pole, did not want to manipulate the stone in the left lower pole without a ureteral access sheath to remove stone fragments. This point a retrograde polygrams obtained that showed filling defect in the left lower pole consistent with the stone. A 6 French by 26 cm double-J ureteral stent was then placed the remaining sensor wire and the sensor wire was removed. A curl seen in the renal pelvis on fluoroscopy and in the urinary bladder direct localization. Since we're unable to access the left collecting system, decision was made to place a right ureteral stent since he will be coming back anyway to treat his left-sided stone to allow easier access of his right renal stone collecting system at the next procedure. He was placed in the identical fashion as the left stent. Patient's bladder was then emptied. Transferred stable condition to the postanesthesia care unit.   Plan:  The patient will follow up in 2-3 weeks for definitive stone management surgery.  Baruch Gouty, M.D.

## 2016-04-01 NOTE — Discharge Instructions (Signed)
Cystoscopy, Care After Refer to this sheet in the next few weeks. These instructions provide you with information about caring for yourself after your procedure. Your health care provider may also give you more specific instructions. Your treatment has been planned according to current medical practices, but problems sometimes occur. Call your health care provider if you have any problems or questions after your procedure. What can I expect after the procedure? After the procedure, it is common to have:  Mild pain when you urinate. Pain should stop within a few minutes after you urinate. This may last for up to 1 week.  A small amount of blood in your urine for several days.  Feeling like you need to urinate but producing only a small amount of urine. Follow these instructions at home:   Medicines  Take over-the-counter and prescription medicines only as told by your health care provider.  If you were prescribed an antibiotic medicine, take it as told by your health care provider. Do not stop taking the antibiotic even if you start to feel better. General instructions  Return to your normal activities as told by your health care provider. Ask your health care provider what activities are safe for you.  Do not drive for 24 hours if you received a sedative.  Watch for any blood in your urine. If the amount of blood in your urine increases, call your health care provider.  Follow instructions from your health care provider about eating or drinking restrictions.  If a tissue sample was removed for testing (biopsy) during your procedure, it is your responsibility to get your test results. Ask your health care provider or the department performing the test when your results will be ready.  Drink enough fluid to keep your urine clear or pale yellow.  Keep all follow-up visits as told by your health care provider. This is important. Contact a health care provider if:  You have pain that  gets worse or does not get better with medicine, especially pain when you urinate.  You have difficulty urinating. Get help right away if:  You have more blood in your urine.  You have blood clots in your urine.  You have abdominal pain.  You have a fever or chills.  You are unable to urinate. This information is not intended to replace advice given to you by your health care provider. Make sure you discuss any questions you have with your health care provider. Document Released: 10/17/2004 Document Revised: 09/05/2015 Document Reviewed: 02/14/2015 Elsevier Interactive Patient Education  2017 [employer]    Ureteral Stent Implantation, Care After Introduction Refer to this sheet in the next few weeks. These instructions provide you with information about caring for yourself after your procedure. Your health care provider may also give you more specific instructions. Your treatment has been planned according to current medical practices, but problems sometimes occur. Call your health care provider if you have any problems or questions after your procedure. What can I expect after the procedure? After the procedure, it is common to have:  Nausea.  Mild pain when you urinate. You may feel this pain in your lower back or lower abdomen. Pain should stop within a few minutes after you urinate. This may last for up to 1 week.  A small amount of blood in your urine for several days. Follow these instructions at home:   Medicines  Take over-the-counter and prescription medicines only as told by your health care provider.  If you were  prescribed an antibiotic medicine, take it as told by your health care provider. Do not stop taking the antibiotic even if you start to feel better.  Do not drive for 24 hours if you received a sedative.  Do not drive or operate heavy machinery while taking prescription pain medicines. Activity  Return to your normal activities as told by your  health care provider. Ask your health care provider what activities are safe for you.  Do not lift anything that is heavier than 10 lb (4.5 kg). Follow this limit for 1 week after your procedure, or for as long as told by your health care provider. General instructions  Watch for any blood in your urine. Call your health care provider if the amount of blood in your urine increases.  If you have a catheter:  Follow instructions from your health care provider about taking care of your catheter and collection bag.  Do not take baths, swim, or use a hot tub until your health care provider approves.  Drink enough fluid to keep your urine clear or pale yellow.  Keep all follow-up visits as told by your health care provider. This is important. Contact a health care provider if:  You have pain that gets worse or does not get better with medicine, especially pain when you urinate.  You have difficulty urinating.  You feel nauseous or you vomit repeatedly during a period of more than 2 days after the procedure. Get help right away if:  Your urine is dark red or has blood clots in it.  You are leaking urine (have incontinence).  The end of the stent comes out of your urethra.  You cannot urinate.  You have sudden, sharp, or severe pain in your abdomen or lower back.  You have a fever. This information is not intended to replace advice given to you by your health care provider. Make sure you discuss any questions you have with your health care provider. Document Released: 11/30/2012 Document Revised: 09/05/2015 Document Reviewed: 10/12/2014  2017 Elsevier  General Anesthesia, Adult, Care After These instructions provide you with information about caring for yourself after your procedure. Your health care provider may also give you more specific instructions. Your treatment has been planned according to current medical practices, but problems sometimes occur. Call your health care  provider if you have any problems or questions after your procedure. What can I expect after the procedure? After the procedure, it is common to have:  Vomiting.  A sore throat.  Mental slowness. It is common to feel:  Nauseous.  Cold or shivery.  Sleepy.  Tired.  Sore or achy, even in parts of your body where you did not have surgery. Follow these instructions at home: For at least 24 hours after the procedure:  Do not:  Participate in activities where you could fall or become injured.  Drive.  Use heavy machinery.  Drink alcohol.  Take sleeping pills or medicines that cause drowsiness.  Make important decisions or sign legal documents.  Take care of children on your own.  Rest. Eating and drinking  If you vomit, drink water, juice, or soup when you can drink without vomiting.  Drink enough fluid to keep your urine clear or pale yellow.  Make sure you have little or no nausea before eating solid foods.  Follow the diet recommended by your health care provider. General instructions  Have a responsible adult stay with you until you are awake and alert.  Return to your  normal activities as told by your health care provider. Ask your health care provider what activities are safe for you.  Take over-the-counter and prescription medicines only as told by your health care provider.  If you smoke, do not smoke without supervision.  Keep all follow-up visits as told by your health care provider. This is important. Contact a health care provider if:  You continue to have nausea or vomiting at home, and medicines are not helpful.  You cannot drink fluids or start eating again.  You cannot urinate after 8-12 hours.  You develop a skin rash.  You have fever.  You have increasing redness at the site of your procedure. Get help right away if:  You have difficulty breathing.  You have chest pain.  You have unexpected bleeding.  You feel that you are  having a life-threatening or urgent problem. This information is not intended to replace advice given to you by your health care provider. Make sure you discuss any questions you have with your health care provider. Document Released: 07/06/2000 Document Revised: 09/02/2015 Document Reviewed: 03/14/2015 Elsevier Interactive Patient Education  2017 [employer].

## 2016-04-01 NOTE — Anesthesia Procedure Notes (Signed)
Procedure Name: LMA Insertion Date/Time: 04/01/2016 2:27 PM Performed by: Cynda Familia Pre-anesthesia Checklist: Patient identified, Emergency Drugs available, Suction available and Patient being monitored Patient Re-evaluated:Patient Re-evaluated prior to inductionOxygen Delivery Method: Circle System Utilized Preoxygenation: Pre-oxygenation with 100% oxygen Intubation Type: IV induction Ventilation: Mask ventilation without difficulty LMA: LMA inserted and LMA with gastric port inserted LMA Size: 5.0 Tube type: Oral Number of attempts: 1 Placement Confirmation: positive ETCO2 Tube secured with: Tape Dental Injury: Teeth and Oropharynx as per pre-operative assessment  Comments: IV induction with [place]--- LMA #4 by AM CRNA--- adjusted by Lissa Hoard--- pt light removing LMA with tongue---- IV Dip ( 2nd dose by Germeroth) LMA with gastric  port #5 placed by AM CRNA--  Adjusted by Lissa Hoard--- good ventilation

## 2016-04-03 ENCOUNTER — Other Ambulatory Visit: Payer: Self-pay | Admitting: Urology

## 2016-04-16 ENCOUNTER — Encounter (HOSPITAL_COMMUNITY): Payer: Self-pay

## 2016-04-16 NOTE — Patient Instructions (Addendum)
[name]  04/16/2016   Your procedure is scheduled on: 04-21-16  Report to [facility]  Entrance take [facility]  elevators to 3rd floor to  [place] at  [place]  AM.  Call this number if you have problems the morning of surgery [phone]   Remember: ONLY 1 PERSON MAY GO WITH YOU TO SHORT STAY TO GET  READY MORNING OF [place].  Do not eat food or drink liquids :After Midnight.     Take these medicines the morning of surgery with A SIP OF WATER: Methadone                               You may not have any metal on your body including hair pins and              piercings  Do not wear jewelry, make-up, lotions, powders or perfumes, deodorant             Do not wear nail polish.  Do not shave  48 hours prior to surgery.              Men may shave face and neck.   Do not bring valuables to the hospital. [place].  Contacts, dentures or bridgework may not be worn into surgery.  Leave suitcase in the car. After surgery it may be brought to your room.     Patients discharged the day of surgery will not be allowed to drive home.  Name and phone number of your driver: Vilinda Flake [phone]              Please read over the following fact sheets you were given: _____________________________________________________________________             [facility] - Preparing for Surgery Before surgery, you can play an important role.  Because skin is not sterile, your skin needs to be as free of germs as possible.  You can reduce the number of germs on your skin by washing with CHG (chlorahexidine gluconate) soap before surgery.  CHG is an antiseptic cleaner which kills germs and bonds with the skin to continue killing germs even after washing. Please DO NOT use if you have an allergy to CHG or antibacterial soaps.  If your skin becomes reddened/irritated stop using the CHG and inform your nurse when you arrive  at Short Stay. Do not shave (including legs and underarms) for at least 48 hours prior to the first CHG shower.  You may shave your face/neck. Please follow these instructions carefully:  1.  Shower with CHG Soap the night before surgery and the  morning of Surgery.  2.  If you choose to wash your hair, wash your hair first as usual with your  normal  shampoo.  3.  After you shampoo, rinse your hair and body thoroughly to remove the  shampoo.                           4.  Use CHG as you would any other liquid soap.  You can apply chg directly  to the skin and wash  Gently with a scrungie or clean washcloth.  5.  Apply the CHG Soap to your body ONLY FROM THE NECK DOWN.   Do not use on face/ open                           Wound or open sores. Avoid contact with eyes, ears mouth and genitals (private parts).                       Wash face,  Genitals (private parts) with your normal soap.             6.  Wash thoroughly, paying special attention to the area where your surgery  will be performed.  7.  Thoroughly rinse your body with warm water from the neck down.  8.  DO NOT shower/wash with your normal soap after using and rinsing off  the CHG Soap.                9.  Pat yourself dry with a clean towel.            10.  Wear clean pajamas.            11.  Place clean sheets on your bed the night of your first shower and do not  sleep with pets. Day of Surgery : Do not apply any lotions/deodorants the morning of surgery.  Please wear clean clothes to the hospital/surgery center.  FAILURE TO FOLLOW THESE INSTRUCTIONS MAY RESULT IN THE CANCELLATION OF YOUR SURGERY PATIENT SIGNATURE_________________________________  NURSE SIGNATURE__________________________________  ________________________________________________________________________

## 2016-04-17 ENCOUNTER — Encounter (HOSPITAL_COMMUNITY)
Admission: RE | Admit: 2016-04-17 | Discharge: 2016-04-17 | Disposition: A | Discharge status: Home / Self Care | Payer: Commercial Managed Care - HMO | Source: Ambulatory Visit | Attending: Urology | Admitting: Urology

## 2016-04-17 ENCOUNTER — Encounter (HOSPITAL_COMMUNITY): Payer: Self-pay

## 2016-04-17 DIAGNOSIS — Z01818 Encounter for other preprocedural examination: Secondary | ICD-10-CM | POA: Insufficient documentation

## 2016-04-17 HISTORY — DX: Major depressive disorder, single episode, unspecified: F32.9

## 2016-04-17 HISTORY — DX: Sleep apnea, unspecified: G47.30

## 2016-04-17 HISTORY — DX: Pneumonia, unspecified organism: J18.9

## 2016-04-17 HISTORY — DX: Depression, unspecified: F32.A

## 2016-04-17 NOTE — Pre-Procedure Instructions (Signed)
CBC, BMP, 03/31/16, epic CXR 05-10-15, epic

## 2016-04-18 LAB — URINE CULTURE: Culture: <10000 — AB

## 2016-04-20 MED ORDER — DEXTROSE 5 % IV SOLN
3.0000 g | INTRAVENOUS | Status: AC
  Administered 2016-04-21: 3 g via INTRAVENOUS
  Filled 2016-04-20 (×2): qty 3000

## 2016-04-20 NOTE — Anesthesia Preprocedure Evaluation (Addendum)
Anesthesia Evaluation  Patient identified by MRN, date of birth, ID band Patient awake    Reviewed: Allergy & Precautions, NPO status , Patient's Chart, lab work & pertinent test results  Airway Mallampati: II  TM Distance: >3 FB Neck ROM: Full    Dental  (+) Edentulous Upper, Edentulous Lower   Pulmonary COPD, Current Smoker, former smoker,    breath sounds clear to auscultation       Cardiovascular + Peripheral Vascular Disease  + Valvular Problems/Murmurs  Rhythm:Regular Rate:Normal  Echo: normal LVSF   Neuro/Psych negative neurological ROS  negative psych ROS   GI/Hepatic negative GI ROS, Neg liver ROS,   Endo/Other  Morbid obesity  Renal/GU negative Renal ROS     Musculoskeletal negative musculoskeletal ROS (+)   Abdominal   Peds  Hematology negative hematology ROS (+)   Anesthesia Other Findings   Reproductive/Obstetrics                           Lab Results  Component Value Date   WBC 4.5 03/31/2016   HGB 11.5 (L) 03/31/2016   HCT 33.7 (L) 03/31/2016   MCV 85.5 03/31/2016   PLT 196 03/31/2016   Lab Results  Component Value Date   CREATININE 0.53 (L) 03/31/2016   BUN 11 03/31/2016   NA 139 03/31/2016   K 3.8 03/31/2016   CL 103 03/31/2016   CO2 29 03/31/2016    Anesthesia Physical  Anesthesia Plan  ASA: III  Anesthesia Plan: General   Post-op Pain Management:    Induction: Intravenous  Airway Management Planned: LMA  Additional Equipment:   Intra-op Plan:   Post-operative Plan: Extubation in OR  Informed Consent: I have reviewed the patients History and Physical, chart, labs and discussed the procedure including the risks, benefits and alternatives for the proposed anesthesia with the patient or authorized representative who has indicated his/her understanding and acceptance.     Plan Discussed with: CRNA and Anesthesiologist  Anesthesia Plan Comments:        Anesthesia Quick Evaluation

## 2016-04-21 ENCOUNTER — Encounter (HOSPITAL_COMMUNITY): Payer: Self-pay | Admitting: *Deleted

## 2016-04-21 ENCOUNTER — Encounter (HOSPITAL_COMMUNITY)
Admission: RE | Disposition: A | Discharge status: Home / Self Care | Payer: Self-pay | Source: Ambulatory Visit | Attending: Urology

## 2016-04-21 ENCOUNTER — Ambulatory Visit (HOSPITAL_COMMUNITY)
Admission: RE | Admit: 2016-04-21 | Discharge: 2016-04-21 | Disposition: A | Discharge status: Home / Self Care | Payer: Commercial Managed Care - HMO | Source: Ambulatory Visit | Attending: Urology | Admitting: Urology

## 2016-04-21 ENCOUNTER — Ambulatory Visit (HOSPITAL_COMMUNITY): Payer: Commercial Managed Care - HMO | Admitting: Anesthesiology

## 2016-04-21 DIAGNOSIS — J449 Chronic obstructive pulmonary disease, unspecified: Secondary | ICD-10-CM | POA: Diagnosis not present

## 2016-04-21 DIAGNOSIS — I739 Peripheral vascular disease, unspecified: Secondary | ICD-10-CM | POA: Diagnosis not present

## 2016-04-21 DIAGNOSIS — Z6839 Body mass index (BMI) 39.0-39.9, adult: Secondary | ICD-10-CM | POA: Diagnosis not present

## 2016-04-21 DIAGNOSIS — F17200 Nicotine dependence, unspecified, uncomplicated: Secondary | ICD-10-CM | POA: Diagnosis not present

## 2016-04-21 DIAGNOSIS — A419 Sepsis, unspecified organism: Secondary | ICD-10-CM | POA: Diagnosis not present

## 2016-04-21 DIAGNOSIS — N200 Calculus of kidney: Secondary | ICD-10-CM | POA: Insufficient documentation

## 2016-04-21 DIAGNOSIS — I872 Venous insufficiency (chronic) (peripheral): Secondary | ICD-10-CM | POA: Diagnosis not present

## 2016-04-21 DIAGNOSIS — N400 Enlarged prostate without lower urinary tract symptoms: Secondary | ICD-10-CM | POA: Insufficient documentation

## 2016-04-21 DIAGNOSIS — Z9884 Bariatric surgery status: Secondary | ICD-10-CM | POA: Diagnosis not present

## 2016-04-21 DIAGNOSIS — G4730 Sleep apnea, unspecified: Secondary | ICD-10-CM | POA: Diagnosis not present

## 2016-04-21 HISTORY — PX: CYSTOSCOPY/URETEROSCOPY/HOLMIUM LASER/STENT PLACEMENT: SHX6546

## 2016-04-21 HISTORY — PX: CYSTOSCOPY W/ URETERAL STENT PLACEMENT: SHX1429

## 2016-04-21 HISTORY — PX: HOLMIUM LASER APPLICATION: SHX5852

## 2016-04-21 LAB — RAPID URINE DRUG SCREEN, HOSP PERFORMED
Amphetamines: NOT DETECTED
BENZODIAZEPINES: NOT DETECTED
Barbiturates: NOT DETECTED
COCAINE: NOT DETECTED
Opiates: POSITIVE — AB
Tetrahydrocannabinol: NOT DETECTED

## 2016-04-21 SURGERY — CYSTOSCOPY/URETEROSCOPY/HOLMIUM LASER/STENT PLACEMENT
Anesthesia: General | Laterality: Bilateral

## 2016-04-21 MED ORDER — FENTANYL CITRATE (PF) 100 MCG/2ML IJ SOLN
25.0000 ug | INTRAMUSCULAR | Status: DC | PRN
  Administered 2016-04-21 (×3): 50 ug via INTRAVENOUS

## 2016-04-21 MED ORDER — LIDOCAINE 2% (20 MG/ML) 5 ML SYRINGE
INTRAMUSCULAR | Status: AC
  Filled 2016-04-21: qty 5

## 2016-04-21 MED ORDER — MIDAZOLAM HCL 5 MG/5ML IJ SOLN
INTRAMUSCULAR | Status: DC | PRN
  Administered 2016-04-21: 2 mg via INTRAVENOUS

## 2016-04-21 MED ORDER — PROMETHAZINE HCL 25 MG/ML IJ SOLN: 6.2500 mg | INTRAMUSCULAR | Status: DC | PRN

## 2016-04-21 MED ORDER — MIDAZOLAM HCL 2 MG/2ML IJ SOLN
INTRAMUSCULAR | Status: AC
Start: 2016-04-21 — End: 2016-04-21
  Filled 2016-04-21: qty 2

## 2016-04-21 MED ORDER — ONDANSETRON HCL 4 MG/2ML IJ SOLN
INTRAMUSCULAR | Status: DC | PRN
  Administered 2016-04-21: 4 mg via INTRAVENOUS

## 2016-04-21 MED ORDER — HYDROCODONE-ACETAMINOPHEN 5-325 MG PO TABS
ORAL_TABLET | ORAL | Status: AC
  Filled 2016-04-21: qty 1

## 2016-04-21 MED ORDER — PROPOFOL 10 MG/ML IV BOLUS
INTRAVENOUS | Status: DC | PRN
  Administered 2016-04-21 (×2): 200 mg via INTRAVENOUS

## 2016-04-21 MED ORDER — DEXAMETHASONE SODIUM PHOSPHATE 10 MG/ML IJ SOLN
INTRAMUSCULAR | Status: DC | PRN
  Administered 2016-04-21: 10 mg via INTRAVENOUS

## 2016-04-21 MED ORDER — HYDROCODONE-ACETAMINOPHEN 5-325 MG PO TABS: 1.0000 | ORAL_TABLET | ORAL | 0 refills | Status: DC | PRN

## 2016-04-21 MED ORDER — CEPHALEXIN 500 MG PO CAPS: 500.0000 mg | ORAL_CAPSULE | Freq: Three times a day (TID) | ORAL | 0 refills | Status: DC

## 2016-04-21 MED ORDER — HYDROCODONE-ACETAMINOPHEN 5-325 MG PO TABS
1.0000 | ORAL_TABLET | Freq: Once | ORAL | Status: AC
  Administered 2016-04-21: 1 via ORAL

## 2016-04-21 MED ORDER — EPHEDRINE 5 MG/ML INJ
INTRAVENOUS | Status: AC
  Filled 2016-04-21: qty 10

## 2016-04-21 MED ORDER — FENTANYL CITRATE (PF) 100 MCG/2ML IJ SOLN
INTRAMUSCULAR | Status: DC | PRN
  Administered 2016-04-21 (×2): 50 ug via INTRAVENOUS

## 2016-04-21 MED ORDER — OXYCODONE-ACETAMINOPHEN 5-325 MG PO TABS
ORAL_TABLET | ORAL | Status: AC
  Filled 2016-04-21: qty 1

## 2016-04-21 MED ORDER — GLYCOPYRROLATE 0.2 MG/ML IV SOSY
PREFILLED_SYRINGE | INTRAVENOUS | Status: DC | PRN
  Administered 2016-04-21: .3 mg via INTRAVENOUS

## 2016-04-21 MED ORDER — PROPOFOL 10 MG/ML IV BOLUS
INTRAVENOUS | Status: AC
  Filled 2016-04-21: qty 40

## 2016-04-21 MED ORDER — LIDOCAINE 2% (20 MG/ML) 5 ML SYRINGE
INTRAMUSCULAR | Status: AC
Start: 2016-04-21 — End: 2016-04-21
  Filled 2016-04-21: qty 5

## 2016-04-21 MED ORDER — FENTANYL CITRATE (PF) 100 MCG/2ML IJ SOLN
INTRAMUSCULAR | Status: AC
  Filled 2016-04-21: qty 2

## 2016-04-21 MED ORDER — EPHEDRINE SULFATE-NACL 50-0.9 MG/10ML-% IV SOSY
PREFILLED_SYRINGE | INTRAVENOUS | Status: DC | PRN
  Administered 2016-04-21 (×4): 5 mg via INTRAVENOUS

## 2016-04-21 MED ORDER — ALBUMIN HUMAN 5 % IV SOLN
INTRAVENOUS | Status: AC
  Filled 2016-04-21: qty 250

## 2016-04-21 MED ORDER — LIDOCAINE 2% (20 MG/ML) 5 ML SYRINGE
INTRAMUSCULAR | Status: DC | PRN
  Administered 2016-04-21: 100 mg via INTRAVENOUS

## 2016-04-21 MED ORDER — DEXAMETHASONE SODIUM PHOSPHATE 10 MG/ML IJ SOLN
INTRAMUSCULAR | Status: AC
  Filled 2016-04-21: qty 1

## 2016-04-21 MED ORDER — ROCURONIUM BROMIDE 50 MG/5ML IV SOSY
PREFILLED_SYRINGE | INTRAVENOUS | Status: AC
Start: 2016-04-21 — End: 2016-04-21
  Filled 2016-04-21: qty 5

## 2016-04-21 MED ORDER — ONDANSETRON HCL 4 MG/2ML IJ SOLN
INTRAMUSCULAR | Status: AC
  Filled 2016-04-21: qty 2

## 2016-04-21 MED ORDER — GLYCOPYRROLATE 0.2 MG/ML IV SOSY
PREFILLED_SYRINGE | INTRAVENOUS | Status: AC
  Filled 2016-04-21: qty 3

## 2016-04-21 MED ORDER — LACTATED RINGERS IV SOLN
INTRAVENOUS | Status: DC | PRN
  Administered 2016-04-21 (×2): via INTRAVENOUS

## 2016-04-21 MED ORDER — SODIUM CHLORIDE 0.9 % IR SOLN
Status: DC | PRN
  Administered 2016-04-21: 6000 mL via INTRAVESICAL

## 2016-04-21 SURGICAL SUPPLY — 17 items
BAG URO CATCHER STRL LF (MISCELLANEOUS) ×2 IMPLANT
BASKET ZERO TIP NITINOL 2.4FR (BASKET) ×4 IMPLANT
CATH INTERMIT  6FR 70CM (CATHETERS) IMPLANT
CATH URET DUAL LUMEN 6-10FR 50 (CATHETERS) ×2 IMPLANT
CLOTH BEACON ORANGE TIMEOUT ST (SAFETY) ×2 IMPLANT
FIBER LASER TRAC TIP (UROLOGICAL SUPPLIES) ×4 IMPLANT
GLOVE BIO SURGEON STRL SZ7.5 (GLOVE) ×6 IMPLANT
GOWN STRL REUS W/TWL LRG LVL3 (GOWN DISPOSABLE) ×4 IMPLANT
GUIDEWIRE ANG ZIPWIRE 038X150 (WIRE) ×2 IMPLANT
GUIDEWIRE STR DUAL SENSOR (WIRE) ×4 IMPLANT
MANIFOLD NEPTUNE II (INSTRUMENTS) ×2 IMPLANT
NS IRRIG 1000ML POUR BTL (IV SOLUTION) ×2 IMPLANT
PACK CYSTO (CUSTOM PROCEDURE TRAY) ×2 IMPLANT
SHEATH ACCESS URETERAL 38CM (SHEATH) ×2 IMPLANT
SHEATH ACCESS URETERAL 54CM (SHEATH) ×2 IMPLANT
STENT URET 6FRX26 CONTOUR (STENTS) ×4 IMPLANT
TUBING CONNECTING 10 (TUBING) ×2 IMPLANT

## 2016-04-21 NOTE — Anesthesia Postprocedure Evaluation (Signed)
Anesthesia Post Note  Patient: Kwaku Dennler  Procedure(s) Performed: Procedure(s) (LRB): CYSTOSCOPY/URETEROSCOPY/HOLMIUM LASER/STENT REPLACEMENT (Bilateral) HOLMIUM LASER APPLICATION (Bilateral) CYSTOSCOPY WITH RETROGRADE PYELOGRAM/URETERAL STENT PLACEMENT (Bilateral)  Patient location during evaluation: PACU Anesthesia Type: General Level of consciousness: awake and alert Pain management: pain level controlled Vital Signs Assessment: post-procedure vital signs reviewed and stable Respiratory status: spontaneous breathing, nonlabored ventilation, respiratory function stable and patient connected to nasal cannula oxygen Cardiovascular status: blood pressure returned to baseline and stable Postop Assessment: no signs of nausea or vomiting Anesthetic complications: no       Last Vitals:  Vitals:   04/21/16 1056 04/21/16 1119  BP: 102/67 126/79  Pulse: 68 (!) 103  Resp: 16 18  Temp: 36.4 C 36.4 C    Last Pain:  Vitals:   04/21/16 1119  TempSrc: Oral  PainSc:                  Tiajuana Amass

## 2016-04-21 NOTE — Transfer of Care (Signed)
Immediate Anesthesia Transfer of Care Note  Patient: [name]  Procedure(s) Performed: Procedure(s): CYSTOSCOPY/URETEROSCOPY/HOLMIUM LASER/STENT REPLACEMENT (Bilateral) HOLMIUM LASER APPLICATION (Bilateral) CYSTOSCOPY WITH RETROGRADE PYELOGRAM/URETERAL STENT PLACEMENT (Bilateral)  Patient Location: PACU  Anesthesia Type:General  Level of Consciousness:  sedated, patient cooperative and responds to stimulation  Airway & Oxygen Therapy:Patient Spontanous Breathing and Patient connected to face mask oxgen  Post-op Assessment:  Report given to PACU RN and Post -op Vital signs reviewed and stable  Post vital signs:  Reviewed and stable  Last Vitals:  Vitals:   04/21/16 0534  BP: (!) 142/77  Pulse: 68  Resp: 16  Temp: [id number]    Complications: No apparent anesthesia complications

## 2016-04-21 NOTE — Op Note (Signed)
Date of procedure: 04/21/16  Preoperative diagnosis:  1. Bilateral nephrolithiasis   Postoperative diagnosis:  1. Same   Procedure: 1. Cystoscopy 2. Bilateral retrograde pyelograms with interpretation 3. Bilateral ureteroscopy 4. Laser lithotripsy 5. Stone basketing 6. Bilateral ureteral stent exchange 6 [place] by 26 cm  Surgeon: Baruch Gouty, MD  Anesthesia: General  Complications: None  Intraoperative findings: The patient had a large 12 mm stone that was located in the left inferior pole that was removed after lithotripsy. He had a 4 mm stone in the right lower pole that was also removed after lithotripsy. Bilateral retrograde pyelograms showed no significant filling defects after the operation.  EBL: None  Specimens: Bilateral renal stones  Drains: Bilateral 6 French by 26 cm double-J ureteral stents  Disposition: Stable to the postanesthesia care unit  Indication for procedure: The patient is a 56 y.o. male with bilateral nephrolithiasis appears the underwent bilateral ureteral stent placement presents today for definitive stone management.  After reviewing the management options for treatment, the patient elected to proceed with the above surgical procedure(s). We have discussed the potential benefits and risks of the procedure, side effects of the proposed treatment, the likelihood of the patient achieving the goals of the procedure, and any potential problems that might occur during the procedure or recuperation. Informed consent has been obtained.  Description of procedure: The patient was met in the preoperative area. All risks, benefits, and indications of the procedure were described in great detail. The patient consented to the procedure. Preoperative antibiotics were given. The patient was taken to the operative theater. General anesthesia was induced per the anesthesia service. The patient was then placed in the dorsal lithotomy position and prepped and draped in  the usual sterile fashion. A preoperative timeout was called.   A 21 French 30 cystoscope was inserted the patient's bladder per urethra atraumatically. The left ureteral stent was then grasped flex the graspers and brought to level the urethral meatus. A sensor wire was exchanged third and was removed. With the aid of a dual-lumen catheter, a second sensor wire was then placed. Ureteral access is then placed over one of the sensor wires to level of the renal pelvis on the left side on fluoroscopy. Pan nephroscopy revealed the known stone in the left lower pole that was 12 mm in size. It is relocated with a stone basket to the right upper pole. Has broken small fragments are then removed with a stone basket. Laser lithotripsy was used to break these stones into fragments. It was a very dense stone. This to proximally 1 hour 45 minutes. All stone fragments greater than 3 mm were removed. Pan nephroscopy revealed no separate stone burden. This was confirmed with a retrograde pyelogram. The access sheath was then removed under direct visualization showing no stone found. A 6 French by 26 cm double-J ureter stent was then placed over the remaining sensor wire with the cystoscope. A curl seen in the renal pelvis on fluoroscopy and the urinary bladder on catheterization.  The exact same process was repeated on the right side to place a ureteral access sheath and current safety sensor wire. The known stone was seen in the right lower pole. It was moved so right upper pole broken into fragments and these records remove the stone basket. We also punctate stones in the lower pole of her unable to be grasped with stone basket due to the small size. At this point. Nephroscopy revealed no further stones as to retrograde polygrams that  were of significant size. Ureteral access is removed under direct visualization. A right ureteral stent was then placed in a similar fashion as on the left side. The patient's bladder was then  drained. The patient was transferred stable condition to postanesthesia care unit.  Plan: The patient return in 1 week for bilateral ureteral stent removal. He'll need an ultrasound in one month to rule out iatrogenic hydronephrosis.  Baruch Gouty, M.D.

## 2016-04-21 NOTE — H&P (View-Only) (Signed)
CC: I have kidney stones.  HPI: [name] is a 56 year-old male patient who was referred by Dr. Jacqlyn Larsen II, MD who is here for renal calculi.  The problem is on both sides. This is not his first kidney stone. He is not currently having flank pain, back pain, groin pain, nausea, vomiting, fever or chills. He has not caught a stone in his urine strainer since his symptoms began.   He has had eswl for treatment of his stones in the past.   Recently passed 1 mm right ureteral stone. Has 12 mm left lower pole stone. Also small stones in right kidney. Largest is 3 mm. He has had stones in the past treated with lithotripsy. He is very concerned of these stones may remove and cause pain. He is very interested in having surgical procedure to correct his stone burden.   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     CC: BPH New Patient  HPI: The patient states if he were to spend the rest of his life with his current urinary condition, he would be mixed.   Not interested in medication. More concerned about stones. Refusing rectal exam and PSA at this time.     AUA Symptom Score: More than 50% of the time he has the sensation of not emptying his bladder completely when finished urinating. He never has to urinate again less that two hours after he has finished urinating. Less than 50% of the time he has to start and stop again several times when he urinates. Less than 20% of the time he finds it difficult to postpone urination. Less than 20% of the time he has a weak urinary stream. More than 50% of the time he has to push or strain to begin urination. He has to get up to urinate 2 times from the time he goes to bed until the time he gets up in the morning.   Calculated AUA Symptom Score: 14    ALLERGIES: None   MEDICATIONS: Methadone Hcl 40 mg tablet, soluble     GU PSH: None   NON-GU PSH: Carpal Tunnel Surgery Gastroplasty Duodenal Switch    GU PMH: None   NON-GU PMH: Acute  gastric ulcer with hemorrhage Cardiac murmur, unspecified GERD Sleep Apnea    FAMILY HISTORY: Colon Cancer - Father Diabetes - Father leukemia - Mother   SOCIAL HISTORY: Marital Status: Divorced Current Smoking Status: Patient smokes.  Has never drank.  Patient's occupation is/was disabled.    REVIEW OF SYSTEMS:    GU Review Male:   Patient reports get up at night to urinate, stream starts and stops, trouble starting your stream, have to strain to urinate , and erection problems. Patient denies frequent urination, hard to postpone urination, burning/ pain with urination, leakage of urine, and penile pain.  Gastrointestinal (Upper):   Patient reports nausea and vomiting. Patient denies indigestion/ heartburn.  Gastrointestinal (Lower):   Patient reports diarrhea. Patient denies constipation.  Constitutional:   Patient denies fever, night sweats, weight loss, and fatigue.  Skin:   Patient denies skin rash/ lesion and itching.  Eyes:   Patient denies blurred vision and double vision.  Ears/ Nose/ Throat:   Patient reports sinus problems. Patient denies sore throat.  Hematologic/Lymphatic:   Patient denies swollen glands and easy bruising.  Cardiovascular:   Patient reports leg swelling. Patient denies chest pains.  Respiratory:   Patient denies cough and shortness of breath.  Endocrine:   Patient  denies excessive thirst.  Musculoskeletal:   Patient reports back pain and joint pain.   Neurological:   Patient denies headaches and dizziness.  Psychologic:   Patient denies depression and anxiety.   VITAL SIGNS:      03/20/2016 10:38 AM  Weight 290 lb / 131.54 kg  Height 72 in / 182.88 cm  BP 109/70 mmHg  Pulse 60 /min  BMI 39.3 kg/m  Notes: has lost 150 pounds after gastric bypass   MULTI-SYSTEM PHYSICAL EXAMINATION:    Constitutional: Well-nourished. No physical deformities. Normally developed. Good grooming.  Neck: Neck symmetrical, not swollen. Normal tracheal position.   Respiratory: No labored breathing, no use of accessory muscles.   Cardiovascular: Normal temperature, normal extremity pulses, no swelling, no varicosities.  Lymphatic: No enlargement of neck, axillae, groin.  Skin: No paleness, no jaundice, no cyanosis. No lesion, no ulcer, no rash.  Neurologic / Psychiatric: Oriented to time, oriented to place, oriented to person. No depression, no anxiety, no agitation.  Gastrointestinal: No mass, no tenderness, no rigidity, non obese abdomen.  Eyes: Normal conjunctivae. Normal eyelids.  Ears, Nose, Mouth, and Throat: Left ear no scars, no lesions, no masses. Right ear no scars, no lesions, no masses. Nose no scars, no lesions, no masses. Normal hearing. Normal lips.  Musculoskeletal: Normal gait and station of head and neck.     PAST DATA REVIEWED:  Source Of History:  Patient  X-Ray Review: [facility].T. Stone Protocol: Reviewed Films. Reviewed Report. Discussed With Patient.     PROCEDURES:          Urinalysis w/Scope Dipstick Dipstick Cont'd Micro  Color: Amber Bilirubin: Neg WBC/hpf: NS (Not Seen)  Appearance: Cloudy Ketones: Neg RBC/hpf: 10 - 20/hpf  Specific Gravity: 1.025 Blood: 3+ Bacteria: Rare (0-9/hpf)  pH: 6.0 Protein: Neg Cystals: NS (Not Seen)  Glucose: Neg Urobilinogen: 0.2 Casts: NS (Not Seen)    Nitrites: Neg Trichomonas: Not Present    Leukocyte Esterase: Neg Mucous: Present      Epithelial Cells: 0 - 5/hpf      Yeast: NS (Not Seen)      Sperm: Not Present    ASSESSMENT:      ICD-10 Details  1 GU:   Kidney Stone - N20.0   2   BPH w/o LUTS - N40.0    PLAN:           Orders Labs Urine Culture and Sensitivity          Schedule Procedure: Unspecified Date - Cysto Uretero Lithotripsy [id number], bilateral          Document Letter(s):  Created for Patient: Clinical Summary   Created for Jacqlyn Larsen II, MD    The patient and I talked about surgical treatment for their kidney stones. Etiologies of kidney stones including  dehydration, poor fluid intake, intake of well water, congenital renal disease, previous bowel surgery, idiopathic, and others were discussed with the patient.   Metabolic evaluation of kidney stone disease was discussed with the patient. Alternative treatment options including increased water intake, increased lemonade intake, and dietary moderation with calcium and oxalate containing foods were discussed with the patient in detail.   The risks, benefits, and some of the possible complications of surgical treatments including cystoscopy, ureteroscopy, renoscopy, laser lithotripsy, extracorporeal shock wave lithotripsy, stent insertion and others were discussed with the patient. All questions were answered.  The patient gave fully informed consent to proceed with a ureteroscopy with or without laser lithotripsy with stone extraction for  the treatment of their kidney stones.   The patient was given instructions to call for abdominal pain, pelvic pain, perirectal pain, nausea, vomiting, diarrhea, fever over 100 degrees F, chills, hematuria, dysuria, frequency, urgency, or urge incontinence.         Notes:   I discussed the patient that his best chance of being stone free with one procedure is with bilateral ureteroscopy. He does understand though it still may take more than one procedure. He is interested in proceeding with this. We discussed the risks and benefits as discussed above.

## 2016-04-21 NOTE — Interval H&P Note (Signed)
History and Physical Interval Note:  04/21/2016 7:19 AM  [name]  has presented today for surgery, with the diagnosis of BILATERAL KIDNEY STONES  The various methods of treatment have been discussed with the patient and family. After consideration of risks, benefits and other options for treatment, the patient has consented to  Procedure(s): CYSTOSCOPY/URETEROSCOPY/HOLMIUM LASER/STENT REPLACEMENT (Bilateral) HOLMIUM LASER APPLICATION (Bilateral) as a surgical intervention .  The patient's history has been reviewed, patient examined, no change in status, stable for surgery.  I have reviewed the patient's chart and labs.  Questions were answered to the patient's satisfaction.    Patient presents for cysto, blt URS, laser litho, stent exchange after unable to access left collecting system on first attempt. Blt stents were placed.  [name]

## 2016-04-28 DIAGNOSIS — N200 Calculus of kidney: Secondary | ICD-10-CM | POA: Diagnosis not present

## 2016-08-29 DIAGNOSIS — S2241XA Multiple fractures of ribs, right side, initial encounter for closed fracture: Secondary | ICD-10-CM | POA: Diagnosis not present

## 2016-09-17 DIAGNOSIS — Z6834 Body mass index (BMI) 34.0-34.9, adult: Secondary | ICD-10-CM | POA: Diagnosis not present

## 2016-09-17 DIAGNOSIS — Z1389 Encounter for screening for other disorder: Secondary | ICD-10-CM | POA: Diagnosis not present

## 2016-09-17 DIAGNOSIS — Z1211 Encounter for screening for malignant neoplasm of colon: Secondary | ICD-10-CM | POA: Diagnosis not present

## 2016-09-17 DIAGNOSIS — R233 Spontaneous ecchymoses: Secondary | ICD-10-CM | POA: Diagnosis not present

## 2016-09-29 DIAGNOSIS — I890 Lymphedema, not elsewhere classified: Secondary | ICD-10-CM | POA: Diagnosis not present

## 2016-09-29 DIAGNOSIS — Z6832 Body mass index (BMI) 32.0-32.9, adult: Secondary | ICD-10-CM | POA: Diagnosis not present

## 2016-09-29 DIAGNOSIS — D539 Nutritional anemia, unspecified: Secondary | ICD-10-CM | POA: Diagnosis not present

## 2016-09-29 DIAGNOSIS — D649 Anemia, unspecified: Secondary | ICD-10-CM | POA: Diagnosis not present

## 2016-09-29 DIAGNOSIS — S91301S Unspecified open wound, right foot, sequela: Secondary | ICD-10-CM | POA: Diagnosis not present

## 2016-10-01 DIAGNOSIS — L97512 Non-pressure chronic ulcer of other part of right foot with fat layer exposed: Secondary | ICD-10-CM | POA: Diagnosis not present

## 2016-10-01 DIAGNOSIS — L97312 Non-pressure chronic ulcer of right ankle with fat layer exposed: Secondary | ICD-10-CM | POA: Diagnosis not present

## 2016-10-01 DIAGNOSIS — D649 Anemia, unspecified: Secondary | ICD-10-CM | POA: Diagnosis not present

## 2016-10-01 DIAGNOSIS — M319 Necrotizing vasculopathy, unspecified: Secondary | ICD-10-CM | POA: Diagnosis not present

## 2016-10-01 DIAGNOSIS — I872 Venous insufficiency (chronic) (peripheral): Secondary | ICD-10-CM | POA: Diagnosis not present

## 2016-10-01 DIAGNOSIS — L97812 Non-pressure chronic ulcer of other part of right lower leg with fat layer exposed: Secondary | ICD-10-CM | POA: Diagnosis not present

## 2016-10-01 DIAGNOSIS — I87311 Chronic venous hypertension (idiopathic) with ulcer of right lower extremity: Secondary | ICD-10-CM | POA: Diagnosis not present

## 2016-10-01 DIAGNOSIS — G629 Polyneuropathy, unspecified: Secondary | ICD-10-CM | POA: Diagnosis not present

## 2016-10-01 DIAGNOSIS — F17200 Nicotine dependence, unspecified, uncomplicated: Secondary | ICD-10-CM | POA: Diagnosis not present

## 2016-10-01 DIAGNOSIS — I739 Peripheral vascular disease, unspecified: Secondary | ICD-10-CM | POA: Diagnosis not present

## 2016-10-01 DIAGNOSIS — L97919 Non-pressure chronic ulcer of unspecified part of right lower leg with unspecified severity: Secondary | ICD-10-CM | POA: Diagnosis not present

## 2016-10-07 DIAGNOSIS — Z1211 Encounter for screening for malignant neoplasm of colon: Secondary | ICD-10-CM | POA: Diagnosis not present

## 2016-10-07 DIAGNOSIS — D509 Iron deficiency anemia, unspecified: Secondary | ICD-10-CM | POA: Diagnosis not present

## 2016-10-20 DIAGNOSIS — Z800 Family history of malignant neoplasm of digestive organs: Secondary | ICD-10-CM | POA: Diagnosis not present

## 2016-10-20 DIAGNOSIS — D122 Benign neoplasm of ascending colon: Secondary | ICD-10-CM | POA: Diagnosis not present

## 2016-10-20 DIAGNOSIS — G8929 Other chronic pain: Secondary | ICD-10-CM | POA: Diagnosis not present

## 2016-10-20 DIAGNOSIS — Z79899 Other long term (current) drug therapy: Secondary | ICD-10-CM | POA: Diagnosis not present

## 2016-10-20 DIAGNOSIS — I890 Lymphedema, not elsewhere classified: Secondary | ICD-10-CM | POA: Diagnosis not present

## 2016-10-20 DIAGNOSIS — F17200 Nicotine dependence, unspecified, uncomplicated: Secondary | ICD-10-CM | POA: Diagnosis not present

## 2016-10-20 DIAGNOSIS — Z9884 Bariatric surgery status: Secondary | ICD-10-CM | POA: Diagnosis not present

## 2016-10-20 DIAGNOSIS — D649 Anemia, unspecified: Secondary | ICD-10-CM | POA: Diagnosis not present

## 2016-10-20 DIAGNOSIS — D500 Iron deficiency anemia secondary to blood loss (chronic): Secondary | ICD-10-CM | POA: Diagnosis not present

## 2016-10-21 DIAGNOSIS — L97812 Non-pressure chronic ulcer of other part of right lower leg with fat layer exposed: Secondary | ICD-10-CM | POA: Diagnosis not present

## 2016-10-21 DIAGNOSIS — I87311 Chronic venous hypertension (idiopathic) with ulcer of right lower extremity: Secondary | ICD-10-CM | POA: Diagnosis not present

## 2016-10-21 DIAGNOSIS — I872 Venous insufficiency (chronic) (peripheral): Secondary | ICD-10-CM | POA: Diagnosis not present

## 2016-10-21 DIAGNOSIS — L97312 Non-pressure chronic ulcer of right ankle with fat layer exposed: Secondary | ICD-10-CM | POA: Diagnosis not present

## 2016-11-17 DIAGNOSIS — Z6831 Body mass index (BMI) 31.0-31.9, adult: Secondary | ICD-10-CM | POA: Diagnosis not present

## 2016-11-17 DIAGNOSIS — I2510 Atherosclerotic heart disease of native coronary artery without angina pectoris: Secondary | ICD-10-CM | POA: Diagnosis not present

## 2016-11-17 DIAGNOSIS — G479 Sleep disorder, unspecified: Secondary | ICD-10-CM | POA: Diagnosis not present

## 2016-12-23 DIAGNOSIS — I87311 Chronic venous hypertension (idiopathic) with ulcer of right lower extremity: Secondary | ICD-10-CM | POA: Diagnosis not present

## 2016-12-23 DIAGNOSIS — I739 Peripheral vascular disease, unspecified: Secondary | ICD-10-CM | POA: Diagnosis not present

## 2016-12-23 DIAGNOSIS — F17210 Nicotine dependence, cigarettes, uncomplicated: Secondary | ICD-10-CM | POA: Diagnosis not present

## 2016-12-23 DIAGNOSIS — G629 Polyneuropathy, unspecified: Secondary | ICD-10-CM | POA: Diagnosis not present

## 2016-12-23 DIAGNOSIS — L97812 Non-pressure chronic ulcer of other part of right lower leg with fat layer exposed: Secondary | ICD-10-CM | POA: Diagnosis not present

## 2016-12-28 DIAGNOSIS — L97212 Non-pressure chronic ulcer of right calf with fat layer exposed: Secondary | ICD-10-CM | POA: Diagnosis not present

## 2016-12-28 DIAGNOSIS — I87311 Chronic venous hypertension (idiopathic) with ulcer of right lower extremity: Secondary | ICD-10-CM | POA: Diagnosis not present

## 2016-12-28 DIAGNOSIS — L97812 Non-pressure chronic ulcer of other part of right lower leg with fat layer exposed: Secondary | ICD-10-CM | POA: Diagnosis not present

## 2016-12-31 DIAGNOSIS — I872 Venous insufficiency (chronic) (peripheral): Secondary | ICD-10-CM | POA: Diagnosis not present

## 2016-12-31 DIAGNOSIS — L97812 Non-pressure chronic ulcer of other part of right lower leg with fat layer exposed: Secondary | ICD-10-CM | POA: Diagnosis not present

## 2017-01-04 DIAGNOSIS — L97812 Non-pressure chronic ulcer of other part of right lower leg with fat layer exposed: Secondary | ICD-10-CM | POA: Diagnosis not present

## 2017-01-04 DIAGNOSIS — I87311 Chronic venous hypertension (idiopathic) with ulcer of right lower extremity: Secondary | ICD-10-CM | POA: Diagnosis not present

## 2017-01-04 DIAGNOSIS — L97212 Non-pressure chronic ulcer of right calf with fat layer exposed: Secondary | ICD-10-CM | POA: Diagnosis not present

## 2017-01-04 DIAGNOSIS — I872 Venous insufficiency (chronic) (peripheral): Secondary | ICD-10-CM | POA: Diagnosis not present

## 2017-01-07 DIAGNOSIS — I872 Venous insufficiency (chronic) (peripheral): Secondary | ICD-10-CM | POA: Diagnosis not present

## 2017-01-07 DIAGNOSIS — L97812 Non-pressure chronic ulcer of other part of right lower leg with fat layer exposed: Secondary | ICD-10-CM | POA: Diagnosis not present

## 2017-01-07 DIAGNOSIS — L97212 Non-pressure chronic ulcer of right calf with fat layer exposed: Secondary | ICD-10-CM | POA: Diagnosis not present

## 2017-01-13 DIAGNOSIS — I87311 Chronic venous hypertension (idiopathic) with ulcer of right lower extremity: Secondary | ICD-10-CM | POA: Diagnosis not present

## 2017-01-13 DIAGNOSIS — L97812 Non-pressure chronic ulcer of other part of right lower leg with fat layer exposed: Secondary | ICD-10-CM | POA: Diagnosis not present

## 2017-01-13 DIAGNOSIS — L97212 Non-pressure chronic ulcer of right calf with fat layer exposed: Secondary | ICD-10-CM | POA: Diagnosis not present

## 2017-01-13 DIAGNOSIS — I872 Venous insufficiency (chronic) (peripheral): Secondary | ICD-10-CM | POA: Diagnosis not present

## 2017-01-14 DIAGNOSIS — L97812 Non-pressure chronic ulcer of other part of right lower leg with fat layer exposed: Secondary | ICD-10-CM | POA: Diagnosis not present

## 2017-01-14 DIAGNOSIS — L97212 Non-pressure chronic ulcer of right calf with fat layer exposed: Secondary | ICD-10-CM | POA: Diagnosis not present

## 2017-01-14 DIAGNOSIS — R600 Localized edema: Secondary | ICD-10-CM | POA: Diagnosis not present

## 2017-01-14 DIAGNOSIS — I87323 Chronic venous hypertension (idiopathic) with inflammation of bilateral lower extremity: Secondary | ICD-10-CM | POA: Diagnosis not present

## 2017-01-14 DIAGNOSIS — I872 Venous insufficiency (chronic) (peripheral): Secondary | ICD-10-CM | POA: Diagnosis not present

## 2017-01-18 DIAGNOSIS — Z6831 Body mass index (BMI) 31.0-31.9, adult: Secondary | ICD-10-CM | POA: Diagnosis not present

## 2017-01-18 DIAGNOSIS — F17200 Nicotine dependence, unspecified, uncomplicated: Secondary | ICD-10-CM | POA: Diagnosis not present

## 2017-01-18 DIAGNOSIS — I872 Venous insufficiency (chronic) (peripheral): Secondary | ICD-10-CM | POA: Diagnosis not present

## 2017-01-18 DIAGNOSIS — L97812 Non-pressure chronic ulcer of other part of right lower leg with fat layer exposed: Secondary | ICD-10-CM | POA: Diagnosis not present

## 2017-01-18 DIAGNOSIS — Z2821 Immunization not carried out because of patient refusal: Secondary | ICD-10-CM | POA: Diagnosis not present

## 2017-01-18 DIAGNOSIS — I87311 Chronic venous hypertension (idiopathic) with ulcer of right lower extremity: Secondary | ICD-10-CM | POA: Diagnosis not present

## 2017-01-18 DIAGNOSIS — L97212 Non-pressure chronic ulcer of right calf with fat layer exposed: Secondary | ICD-10-CM | POA: Diagnosis not present

## 2017-01-18 DIAGNOSIS — R590 Localized enlarged lymph nodes: Secondary | ICD-10-CM | POA: Diagnosis not present

## 2017-01-21 DIAGNOSIS — L97212 Non-pressure chronic ulcer of right calf with fat layer exposed: Secondary | ICD-10-CM | POA: Diagnosis not present

## 2017-01-21 DIAGNOSIS — I872 Venous insufficiency (chronic) (peripheral): Secondary | ICD-10-CM | POA: Diagnosis not present

## 2017-01-21 DIAGNOSIS — L97812 Non-pressure chronic ulcer of other part of right lower leg with fat layer exposed: Secondary | ICD-10-CM | POA: Diagnosis not present

## 2017-01-25 DIAGNOSIS — L97812 Non-pressure chronic ulcer of other part of right lower leg with fat layer exposed: Secondary | ICD-10-CM | POA: Diagnosis not present

## 2017-01-25 DIAGNOSIS — R590 Localized enlarged lymph nodes: Secondary | ICD-10-CM | POA: Diagnosis not present

## 2017-01-25 DIAGNOSIS — I872 Venous insufficiency (chronic) (peripheral): Secondary | ICD-10-CM | POA: Diagnosis not present

## 2017-01-25 DIAGNOSIS — I87311 Chronic venous hypertension (idiopathic) with ulcer of right lower extremity: Secondary | ICD-10-CM | POA: Diagnosis not present

## 2017-01-25 DIAGNOSIS — L97212 Non-pressure chronic ulcer of right calf with fat layer exposed: Secondary | ICD-10-CM | POA: Diagnosis not present

## 2017-01-25 DIAGNOSIS — L03119 Cellulitis of unspecified part of limb: Secondary | ICD-10-CM | POA: Insufficient documentation

## 2017-01-28 DIAGNOSIS — L97812 Non-pressure chronic ulcer of other part of right lower leg with fat layer exposed: Secondary | ICD-10-CM | POA: Diagnosis not present

## 2017-01-28 DIAGNOSIS — I87311 Chronic venous hypertension (idiopathic) with ulcer of right lower extremity: Secondary | ICD-10-CM | POA: Diagnosis not present

## 2017-01-28 DIAGNOSIS — L97212 Non-pressure chronic ulcer of right calf with fat layer exposed: Secondary | ICD-10-CM | POA: Diagnosis not present

## 2017-01-28 DIAGNOSIS — I872 Venous insufficiency (chronic) (peripheral): Secondary | ICD-10-CM | POA: Diagnosis not present

## 2017-01-29 DIAGNOSIS — J449 Chronic obstructive pulmonary disease, unspecified: Secondary | ICD-10-CM | POA: Diagnosis not present

## 2017-01-29 DIAGNOSIS — F17210 Nicotine dependence, cigarettes, uncomplicated: Secondary | ICD-10-CM | POA: Diagnosis not present

## 2017-01-29 DIAGNOSIS — G8929 Other chronic pain: Secondary | ICD-10-CM | POA: Diagnosis not present

## 2017-01-29 DIAGNOSIS — Z951 Presence of aortocoronary bypass graft: Secondary | ICD-10-CM | POA: Diagnosis not present

## 2017-01-29 DIAGNOSIS — D72822 Plasmacytosis: Secondary | ICD-10-CM | POA: Diagnosis not present

## 2017-01-29 DIAGNOSIS — I2510 Atherosclerotic heart disease of native coronary artery without angina pectoris: Secondary | ICD-10-CM | POA: Diagnosis not present

## 2017-01-29 DIAGNOSIS — R590 Localized enlarged lymph nodes: Secondary | ICD-10-CM | POA: Diagnosis not present

## 2017-01-29 DIAGNOSIS — G4733 Obstructive sleep apnea (adult) (pediatric): Secondary | ICD-10-CM | POA: Diagnosis not present

## 2017-01-29 DIAGNOSIS — Z79899 Other long term (current) drug therapy: Secondary | ICD-10-CM | POA: Diagnosis not present

## 2017-01-29 HISTORY — PX: LYMPH NODE BIOPSY: SHX201

## 2017-02-01 DIAGNOSIS — L97212 Non-pressure chronic ulcer of right calf with fat layer exposed: Secondary | ICD-10-CM | POA: Diagnosis not present

## 2017-02-01 DIAGNOSIS — I872 Venous insufficiency (chronic) (peripheral): Secondary | ICD-10-CM | POA: Diagnosis not present

## 2017-02-01 DIAGNOSIS — L97812 Non-pressure chronic ulcer of other part of right lower leg with fat layer exposed: Secondary | ICD-10-CM | POA: Diagnosis not present

## 2017-02-01 DIAGNOSIS — I87313 Chronic venous hypertension (idiopathic) with ulcer of bilateral lower extremity: Secondary | ICD-10-CM | POA: Diagnosis not present

## 2017-02-08 DIAGNOSIS — L97212 Non-pressure chronic ulcer of right calf with fat layer exposed: Secondary | ICD-10-CM | POA: Diagnosis not present

## 2017-02-08 DIAGNOSIS — I872 Venous insufficiency (chronic) (peripheral): Secondary | ICD-10-CM | POA: Diagnosis not present

## 2017-02-08 DIAGNOSIS — I87311 Chronic venous hypertension (idiopathic) with ulcer of right lower extremity: Secondary | ICD-10-CM | POA: Diagnosis not present

## 2017-02-09 DIAGNOSIS — Z09 Encounter for follow-up examination after completed treatment for conditions other than malignant neoplasm: Secondary | ICD-10-CM | POA: Insufficient documentation

## 2017-02-26 DIAGNOSIS — L03211 Cellulitis of face: Secondary | ICD-10-CM | POA: Diagnosis not present

## 2017-03-01 DIAGNOSIS — I2510 Atherosclerotic heart disease of native coronary artery without angina pectoris: Secondary | ICD-10-CM | POA: Diagnosis not present

## 2017-03-01 DIAGNOSIS — L03211 Cellulitis of face: Secondary | ICD-10-CM | POA: Diagnosis not present

## 2017-03-01 DIAGNOSIS — L0201 Cutaneous abscess of face: Secondary | ICD-10-CM | POA: Diagnosis not present

## 2017-03-01 DIAGNOSIS — Z6829 Body mass index (BMI) 29.0-29.9, adult: Secondary | ICD-10-CM | POA: Diagnosis not present

## 2017-03-16 DIAGNOSIS — T8189XA Other complications of procedures, not elsewhere classified, initial encounter: Secondary | ICD-10-CM | POA: Insufficient documentation

## 2017-03-16 DIAGNOSIS — I898 Other specified noninfective disorders of lymphatic vessels and lymph nodes: Secondary | ICD-10-CM | POA: Insufficient documentation

## 2017-04-19 DIAGNOSIS — Z1211 Encounter for screening for malignant neoplasm of colon: Secondary | ICD-10-CM | POA: Diagnosis not present

## 2017-04-24 IMAGING — RF DG UGI W/ KUB
8 of 15 series · 8 of 18 positions shown · non-contrast
Comparison: CT abdomen pelvis 08/12/2014.

CLINICAL DATA: Preoperative evaluation prior to bariatric surgery.

EXAM:
UPPER GI SERIES WITH KUB
TECHNIQUE: After obtaining a scout radiograph a routine upper GI series was
performed using thin density barium
FLUOROSCOPY TIME:  Fluoroscopy Time (in minutes and seconds): 4
minutes 18 seconds

[Series 1: t abdomen supine · 0.15mm/px · 1 of 1 slices shown (1 of 2)]
[im 1/1]
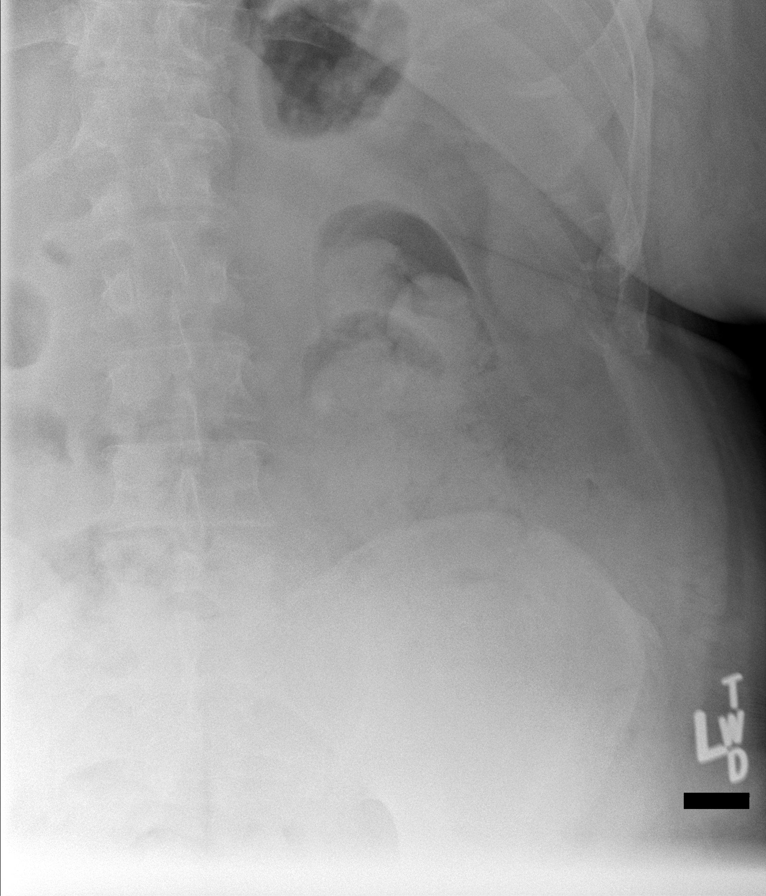

[Series 2: t abdomen supine · 0.15mm/px · 1 of 1 slices shown (2 of 2)]
[im 1/1]
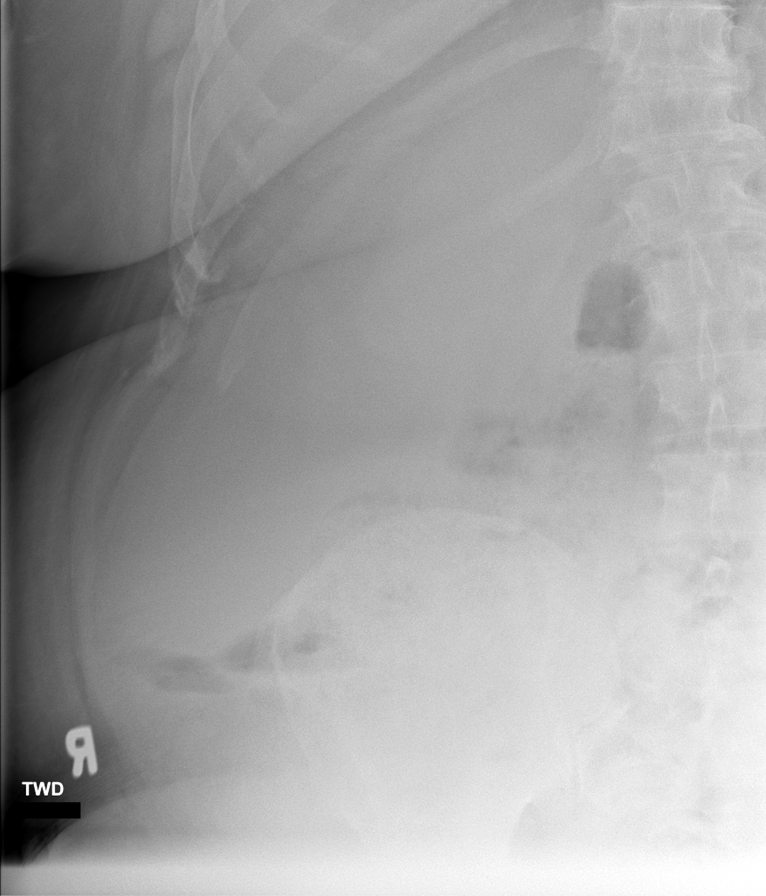

[Series 3: cp_standard · 0.53mm/px · 1 of 87 frames shown]
[frame 44/87]
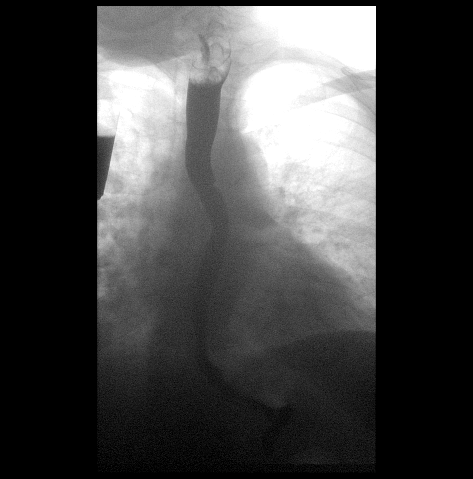

[Series 4: fluoro_barium 2fps_bw · 0.18mm/px · 1 of 1 slices shown (1 of 5)]
[im 1/1]
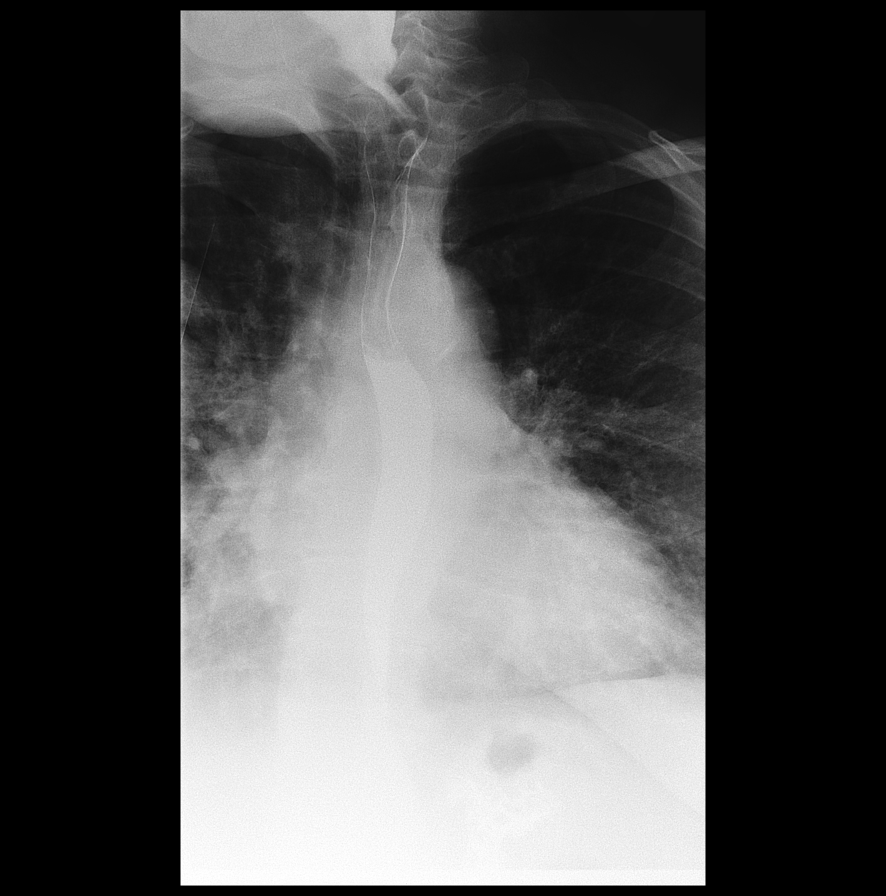

[Series 5: fluoro_barium 2fps_bw · 0.18mm/px · 1 of 1 slices shown (2 of 5)]
[im 1/1]
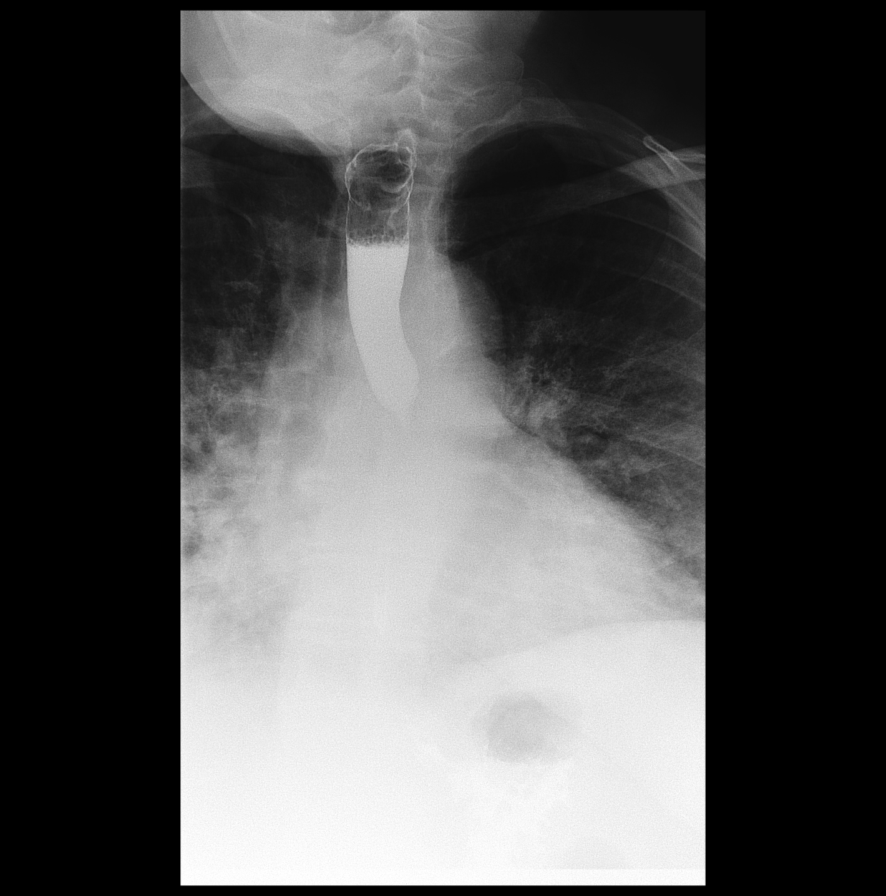

[Series 6: fluoro_barium 2fps_bw · 0.18mm/px · 1 of 1 slices shown (3 of 5)]
[im 1/1]
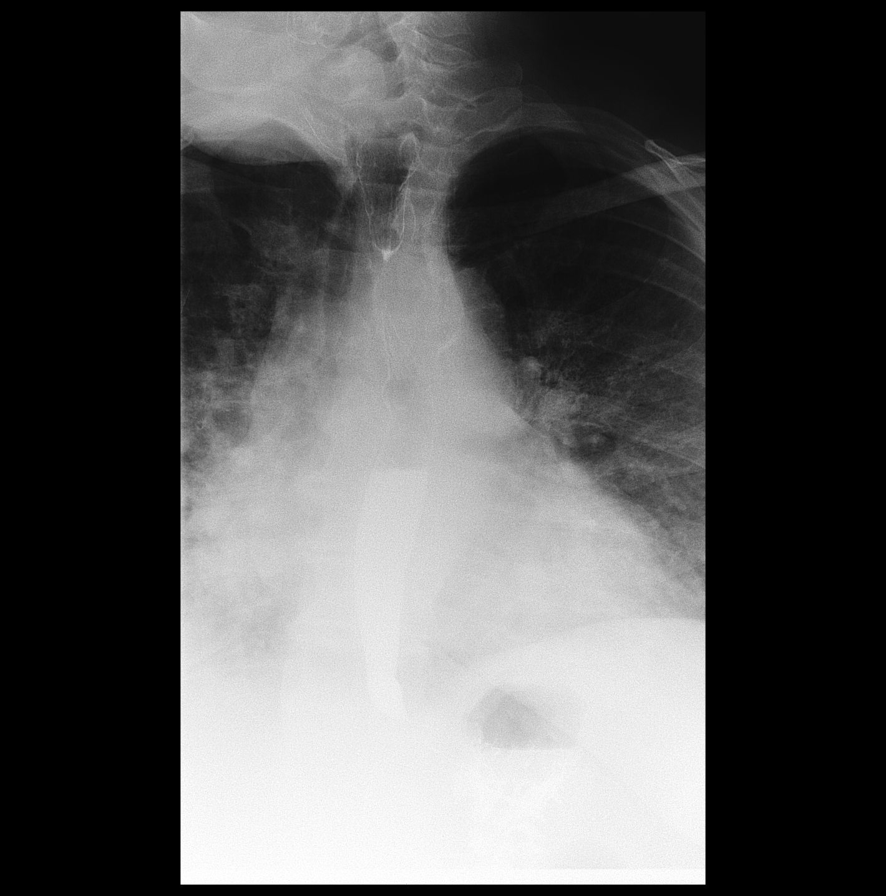

[Series 7: fluoro_barium 2fps_bw · 0.18mm/px · 1 of 1 slices shown (4 of 5)]
[im 1/1]
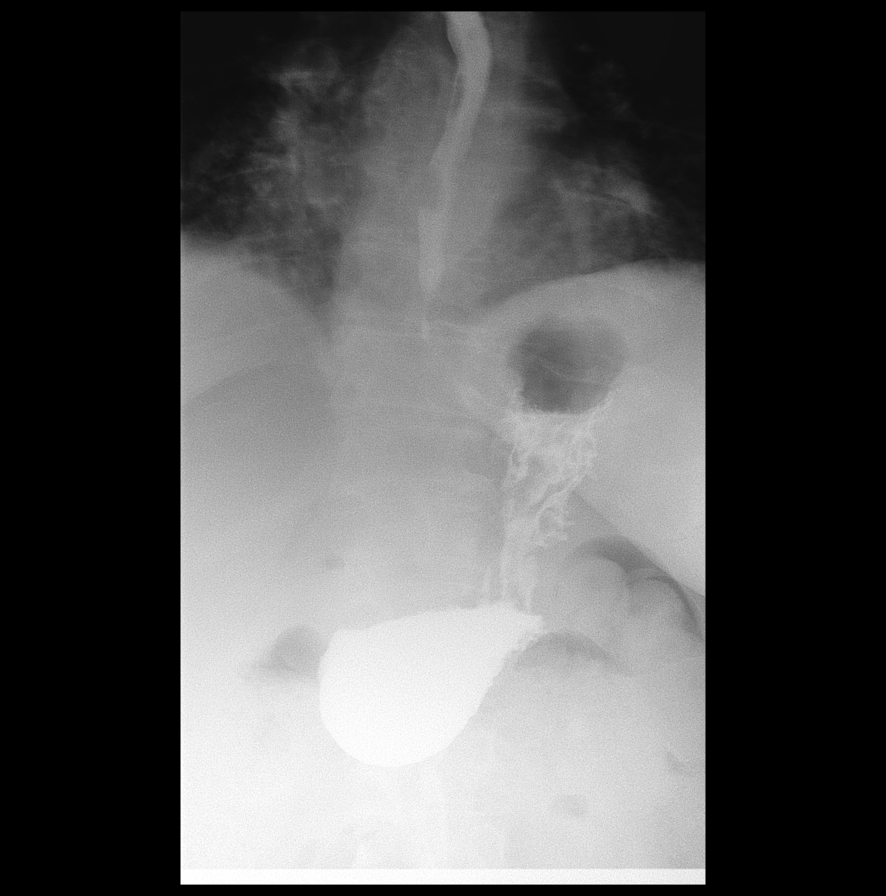

[Series 8: fluoro_barium 2fps_bw · 0.19mm/px · 1 of 1 slices shown (5 of 5)]
[im 1/1]
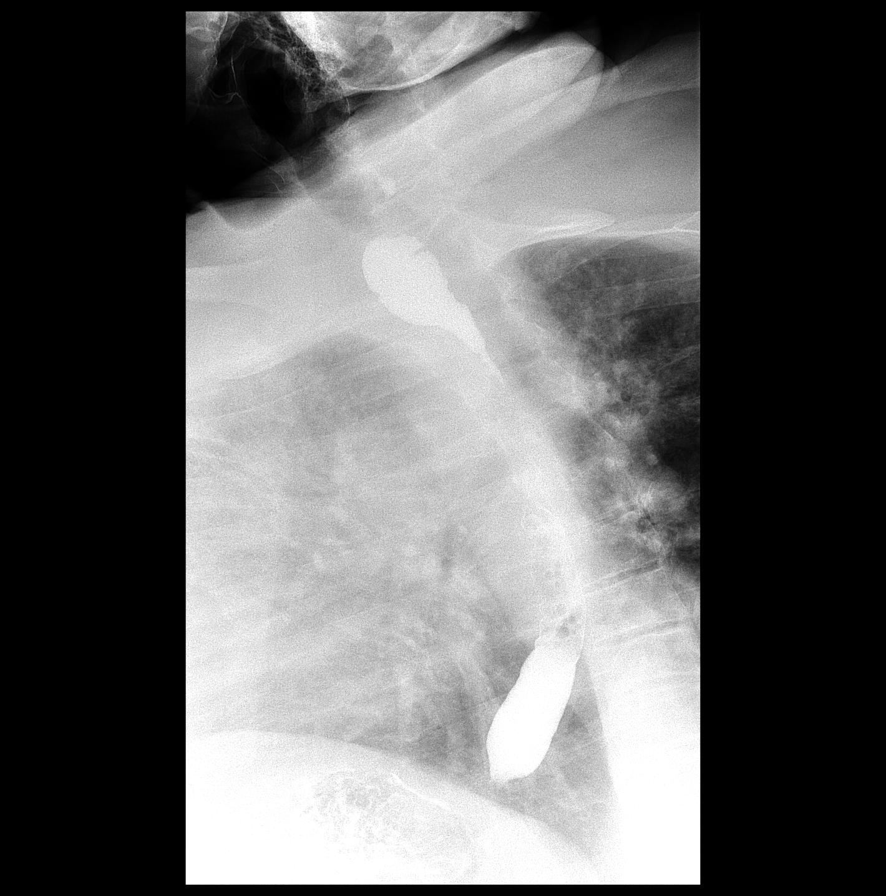

[8 of 18 positions shown; findings below may reference images not displayed]

FINDINGS: Initial abdominal radiographs demonstrate a nonobstructed bowel gas
pattern.

Contrast flows freely throughout the esophagus into the stomach
without evidence for stricture or mass lesion. Mild esophageal
dysmotility. Spontaneous gastroesophageal reflux was demonstrated to
the mid esophagus. Probable small hiatal hernia. Normal morphology
to the stomach. Contrast passes appropriately throughout the
duodenum which is normal in course.
IMPRESSION: Probable small hiatal hernia.

Mild esophageal dysmotility.

Spontaneous gastroesophageal reflux to the level of the
midesophagus.

Grossly normal anatomic configuration of esophagus, stomach and
proximal duodenum.

## 2017-05-20 DIAGNOSIS — Z1211 Encounter for screening for malignant neoplasm of colon: Secondary | ICD-10-CM | POA: Diagnosis not present

## 2017-05-20 DIAGNOSIS — J449 Chronic obstructive pulmonary disease, unspecified: Secondary | ICD-10-CM | POA: Diagnosis not present

## 2017-05-20 DIAGNOSIS — G4733 Obstructive sleep apnea (adult) (pediatric): Secondary | ICD-10-CM | POA: Diagnosis not present

## 2017-05-20 DIAGNOSIS — G8929 Other chronic pain: Secondary | ICD-10-CM | POA: Diagnosis not present

## 2017-05-20 DIAGNOSIS — Z951 Presence of aortocoronary bypass graft: Secondary | ICD-10-CM | POA: Diagnosis not present

## 2017-05-20 DIAGNOSIS — F17200 Nicotine dependence, unspecified, uncomplicated: Secondary | ICD-10-CM | POA: Diagnosis not present

## 2017-05-20 DIAGNOSIS — I2510 Atherosclerotic heart disease of native coronary artery without angina pectoris: Secondary | ICD-10-CM | POA: Diagnosis not present

## 2017-07-05 DIAGNOSIS — H04123 Dry eye syndrome of bilateral lacrimal glands: Secondary | ICD-10-CM | POA: Diagnosis not present

## 2017-07-12 DIAGNOSIS — S301XXD Contusion of abdominal wall, subsequent encounter: Secondary | ICD-10-CM | POA: Diagnosis not present

## 2017-07-12 DIAGNOSIS — Z6830 Body mass index (BMI) 30.0-30.9, adult: Secondary | ICD-10-CM | POA: Diagnosis not present

## 2017-07-12 DIAGNOSIS — N400 Enlarged prostate without lower urinary tract symptoms: Secondary | ICD-10-CM | POA: Diagnosis not present

## 2017-07-12 DIAGNOSIS — I898 Other specified noninfective disorders of lymphatic vessels and lymph nodes: Secondary | ICD-10-CM | POA: Diagnosis not present

## 2017-08-10 DIAGNOSIS — N401 Enlarged prostate with lower urinary tract symptoms: Secondary | ICD-10-CM | POA: Diagnosis not present

## 2017-08-10 DIAGNOSIS — R3911 Hesitancy of micturition: Secondary | ICD-10-CM | POA: Diagnosis not present

## 2017-08-10 DIAGNOSIS — Z125 Encounter for screening for malignant neoplasm of prostate: Secondary | ICD-10-CM | POA: Diagnosis not present

## 2017-08-10 DIAGNOSIS — N200 Calculus of kidney: Secondary | ICD-10-CM | POA: Diagnosis not present

## 2017-10-01 DIAGNOSIS — S301XXD Contusion of abdominal wall, subsequent encounter: Secondary | ICD-10-CM | POA: Diagnosis not present

## 2017-10-01 DIAGNOSIS — Z1339 Encounter for screening examination for other mental health and behavioral disorders: Secondary | ICD-10-CM | POA: Diagnosis not present

## 2017-10-01 DIAGNOSIS — Z1331 Encounter for screening for depression: Secondary | ICD-10-CM | POA: Diagnosis not present

## 2017-10-01 DIAGNOSIS — M1711 Unilateral primary osteoarthritis, right knee: Secondary | ICD-10-CM | POA: Diagnosis not present

## 2017-10-01 DIAGNOSIS — Z0000 Encounter for general adult medical examination without abnormal findings: Secondary | ICD-10-CM | POA: Diagnosis not present

## 2017-10-01 DIAGNOSIS — M25561 Pain in right knee: Secondary | ICD-10-CM | POA: Diagnosis not present

## 2017-10-06 DIAGNOSIS — M1711 Unilateral primary osteoarthritis, right knee: Secondary | ICD-10-CM | POA: Diagnosis not present

## 2017-10-07 DIAGNOSIS — M25561 Pain in right knee: Secondary | ICD-10-CM | POA: Diagnosis not present

## 2017-10-11 DIAGNOSIS — I8390 Asymptomatic varicose veins of unspecified lower extremity: Secondary | ICD-10-CM | POA: Diagnosis not present

## 2017-10-11 DIAGNOSIS — Z6830 Body mass index (BMI) 30.0-30.9, adult: Secondary | ICD-10-CM | POA: Diagnosis not present

## 2017-10-11 DIAGNOSIS — F17200 Nicotine dependence, unspecified, uncomplicated: Secondary | ICD-10-CM | POA: Diagnosis not present

## 2017-10-19 ENCOUNTER — Other Ambulatory Visit: Payer: Self-pay

## 2017-10-19 DIAGNOSIS — I872 Venous insufficiency (chronic) (peripheral): Secondary | ICD-10-CM

## 2017-11-22 ENCOUNTER — Ambulatory Visit (HOSPITAL_COMMUNITY)
Admission: RE | Admit: 2017-11-22 | Discharge: 2017-11-22 | Disposition: A | Discharge status: Home / Self Care | Payer: PPO | Source: Ambulatory Visit | Attending: Surgery | Admitting: Surgery

## 2017-11-22 DIAGNOSIS — I872 Venous insufficiency (chronic) (peripheral): Secondary | ICD-10-CM | POA: Diagnosis not present

## 2017-11-22 DIAGNOSIS — R599 Enlarged lymph nodes, unspecified: Secondary | ICD-10-CM | POA: Diagnosis not present

## 2017-11-25 ENCOUNTER — Encounter: Payer: Self-pay | Admitting: Vascular Surgery

## 2017-11-25 ENCOUNTER — Other Ambulatory Visit: Payer: Self-pay

## 2017-11-25 ENCOUNTER — Ambulatory Visit (INDEPENDENT_AMBULATORY_CARE_PROVIDER_SITE_OTHER): Payer: PPO | Admitting: Vascular Surgery

## 2017-11-25 VITALS — BP 118/74 | HR 56 | Temp 97.0°F | Resp 18 | Ht 69.0 in | Wt 224.3 lb

## 2017-11-25 DIAGNOSIS — I83893 Varicose veins of bilateral lower extremities with other complications: Secondary | ICD-10-CM

## 2017-11-25 NOTE — Progress Notes (Signed)
Referring Physician: Dr Lin Landsman  Patient name: [name] MRN: [id number] DOB: Apr 30, 1960 Sex: male  REASON FOR CONSULT: Symptomatic varicose veins with pain and bleeding  HPI: [name] is a 57 y.o. male a long-standing history of varicose veins.  He most recently had a bleeding episode from the top of his right foot July 2019.  He has had bilateral ulcerations in the past that have been cared for by the wound center in [place].  He currently has no open ulcers.  He denies prior history of DVT.  He denies any family history of varicose veins.  He did have a gastric bypass in 2015/07/13 and lost 260 pounds.  He has had bleeding episodes from the left leg several years ago.  Other medical problems include obesity, sleep apnea both of which are currently stable.  He currently does smoke.  He was given a brochure regarding smoking cessation today.  Complains of numbness and tingling which is been present on his feet for several years.  He is on methadone for a previous heroin habit.  Past Medical History:  Diagnosis Date  . CHF (congestive heart failure) ([place])   . Depression   . Heart murmur    hx of small murmur   . History of kidney stones   . Morbid obesity ([place])   . Pneumonia   . Sleep apnea   . Venous insufficiency of leg   . Venous stasis dermatitis    Past Surgical History:  Procedure Laterality Date  . CARPAL TUNNEL RELEASE     bilateral   . CYSTOSCOPY W/ URETERAL STENT PLACEMENT Bilateral 04/21/2016   Procedure: CYSTOSCOPY WITH RETROGRADE PYELOGRAM/URETERAL STENT PLACEMENT;  Surgeon: [name], MD;  Location: [facility];  Service: Urology;  Laterality: Bilateral;  . CYSTOSCOPY/URETEROSCOPY/HOLMIUM LASER/STENT PLACEMENT Bilateral 04/01/2016   Procedure: CYSTOSCOPY WITH RETROGRADE AND  STENT PLACEMENT;  Surgeon: [name], MD;  Location: [facility];  Service: Urology;  Laterality: Bilateral;  . CYSTOSCOPY/URETEROSCOPY/HOLMIUM LASER/STENT PLACEMENT Bilateral 04/21/2016   Procedure: CYSTOSCOPY/URETEROSCOPY/HOLMIUM LASER/STENT REPLACEMENT;  Surgeon: [name], MD;  Location: [facility];  Service: Urology;  Laterality: Bilateral;  . EYE SURGERY    . gastric bypass surgery   10/2015  . HOLMIUM LASER APPLICATION Bilateral 08/14/2990   Procedure: HOLMIUM LASER APPLICATION;  Surgeon: [name], MD;  Location: [facility];  Service: Urology;  Laterality: Bilateral;  Gastric bypass 2015/07/13  Family History  Problem Relation Age of Onset  . Leukemia Mother        passed away July 12, 2008  . Diabetes Father   . Heart disease Father   . Hypertension Father   . Colon cancer Father     SOCIAL HISTORY: Social History   Socioeconomic History  . Marital status: Legally Separated    Spouse name: Not on file  . Number of children: Not on file  . Years of education: Not on file  . Highest education level: Not on file  Occupational History  . Not on file  Social Needs  . Financial resource strain: Not on file  . Food insecurity:    Worry: Not on file    Inability: Not on file  . Transportation needs:    Medical: Not on file    Non-medical: Not on file  Tobacco Use  . Smoking status: Current Some Day Smoker    Packs/day: 0.50    Years: 35.00    Pack years: 17.50    Types: Cigarettes    Last attempt to  quit: 05/17/2015    Years since quitting: 2.5  . Smokeless tobacco: Never Used  . Tobacco comment: currently only smoking 0.5 ppd  Substance and Sexual Activity  . Alcohol use: No  . 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 Sexual activity: Not on file  Lifestyle  . Physical activity:    Days per week: Not on file    Minutes per session: Not on file  . Stress: Not on file  Relationships  . Social connections:    Talks on phone: Not on file    Gets together: Not on file    Attends religious service: Not on file    Active member of club or organization: Not on file    Attends  meetings of clubs or organizations: Not on file    Relationship status: Not on file  . Intimate partner violence:    Fear of current or ex partner: Not on file    Emotionally abused: Not on file    Physically abused: Not on file    Forced sexual activity: Not on file  Other Topics Concern  . Not on file  Social History Narrative   Divorced man.  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  Lives in [place], [place].  4 children all in good health.  3 sisters in good health.  Father in poor health near by    No Known Allergies  Current Outpatient Medications  Medication Sig Dispense Refill  . methadone (DOLOPHINE) 10 MG/5ML solution Take 185 mg by mouth daily.    . cephALEXin (KEFLEX) 500 MG capsule Take 1 capsule (500 mg total) by mouth 3 (three) times daily. (Patient not taking: Reported on 11/25/2017) 6 capsule 0  . HYDROcodone-acetaminophen (NORCO) 5-325 MG tablet Take 1-2 tablets by mouth every 4 (four) hours as needed for moderate pain. (Patient not taking: Reported on 11/25/2017) 30 tablet 0   No current facility-administered medications for this visit.     ROS:   General:  No weight loss, Fever, chills  HEENT: No recent headaches, no nasal bleeding, no visual changes, no sore throat  Neurologic: No dizziness, blackouts, seizures. No recent symptoms of stroke or mini- stroke. No recent episodes of slurred speech, or temporary blindness.  Cardiac: No recent episodes of chest pain/pressure, no shortness of breath at rest.  No shortness of breath with exertion.  Denies history of atrial fibrillation or irregular heartbeat  Vascular: No history of rest pain in feet.  No history of claudication.  + history of non-healing ulcer, No history of DVT   Pulmonary: No home oxygen, no productive cough, no hemoptysis,  No asthma or wheezing  Musculoskeletal:  [ ]  Arthritis, [ ]  Low back pain,  [ ]  Joint pain  Hematologic:No history of  hypercoagulable state.  No history of easy bleeding.  No history of anemia  Gastrointestinal: No hematochezia or melena,  No gastroesophageal reflux, no trouble swallowing  Urinary: [ ]  chronic Kidney disease, [ ]  on HD - [ ]  MWF or [ ]  TTHS, [ ]  Burning with urination, [ ]  Frequent urination, [ ]  Difficulty urinating;   Skin: No rashes  Psychological: No history of anxiety,  No history of depression   Physical Examination  Vitals:   11/25/17 1440  BP: 118/74  Pulse: (!) 56  Resp: 18  Temp: (!) 97 F (36.1 C)  TempSrc: Oral  SpO2: 100%  Weight: 224 lb 4.8 oz (101.7 kg)  Height: 5\' 9"  (1.753 m)    Body mass index is 33.12 kg/m.  General:  Alert and oriented, no acute distress HEENT: Normal Neck: No bruit or JVD Pulmonary: Clear to auscultation bilaterally Cardiac: Regular Rate and Rhythm without murmur Abdomen: Soft, non-tender, non-distended, no mass, no scars Skin: No rash, lymphocele right groin Extremity Pulses:  2+ radial, brachial, femoral, dorsalis pedis, posterior tibial pulses bilaterally Musculoskeletal: No deformity or edema  Neurologic: Upper and lower extremity motor 5/5 and symmetric              DATA:  Patient had a venous duplex exam today.  Showed a tortuous right greater saphenous vein with reflux diffusely for 9 mm diameter.  He also had diffuse deep vein reflux bilaterally.  Left greater saphenous was also incompetent with 5 mm diameter.  ASSESSMENT: Bilateral symptomatic varicose veins with several episodes of prior bleeding as well as prior ulcerations.  He is CEAP class IV   PLAN: Patient was given a prescription today for long leg compression stockings.  He will return in 3 months time for further evaluation and consideration of bilateral laser ablation and bilateral stab avulsions greater than 20.  Will call if he has additional bleeding episodes prior to this.   Ruta Hinds, MD [facility] Office: [phone] Pager: [phone]

## 2017-11-26 ENCOUNTER — Encounter: Payer: Self-pay | Admitting: Family Medicine

## 2018-02-23 ENCOUNTER — Ambulatory Visit: Payer: PPO | Admitting: Vascular Surgery

## 2018-02-24 ENCOUNTER — Encounter: Payer: Self-pay | Admitting: Vascular Surgery

## 2018-03-28 DIAGNOSIS — R600 Localized edema: Secondary | ICD-10-CM | POA: Diagnosis not present

## 2018-04-19 DIAGNOSIS — Z6832 Body mass index (BMI) 32.0-32.9, adult: Secondary | ICD-10-CM | POA: Diagnosis not present

## 2018-04-19 DIAGNOSIS — F17200 Nicotine dependence, unspecified, uncomplicated: Secondary | ICD-10-CM | POA: Diagnosis not present

## 2018-04-19 DIAGNOSIS — R310 Gross hematuria: Secondary | ICD-10-CM | POA: Diagnosis not present

## 2018-04-20 DIAGNOSIS — R310 Gross hematuria: Secondary | ICD-10-CM | POA: Diagnosis not present

## 2018-04-20 DIAGNOSIS — N200 Calculus of kidney: Secondary | ICD-10-CM | POA: Diagnosis not present

## 2018-04-20 DIAGNOSIS — N210 Calculus in bladder: Secondary | ICD-10-CM | POA: Diagnosis not present

## 2018-04-25 ENCOUNTER — Other Ambulatory Visit: Payer: Self-pay | Admitting: Urology

## 2018-04-25 ENCOUNTER — Other Ambulatory Visit (HOSPITAL_COMMUNITY): Payer: Self-pay | Admitting: Urology

## 2018-04-25 DIAGNOSIS — N200 Calculus of kidney: Secondary | ICD-10-CM

## 2018-05-04 ENCOUNTER — Ambulatory Visit: Payer: PPO | Admitting: Vascular Surgery

## 2018-05-06 NOTE — Patient Instructions (Signed)
[name]  05/06/2018   Your procedure is scheduled on: 05-17-18   Report to [facility] Main  Entrance             Report to Radiology   at               0800   AM     Call this number if you have problems the morning of surgery [phone]    Remember: Do not eat food or drink liquids :After Midnight.   BRUSH YOUR TEETH MORNING OF SURGERY AND RINSE YOUR MOUTH OUT, NO CHEWING GUM CANDY OR MINTS.     Take these medicines the morning of surgery with A SIP OF WATER: methadone                                 You may not have any metal on your body including hair pins and              piercings  Do not wear jewelry,  lotions, powders or perfumes, deodorant               Men may shave face and neck.   Do not bring valuables to the hospital. [place].  Contacts, dentures or bridgework may not be worn into surgery.  Leave suitcase in the car. After surgery it may be brought to your room.                Please read over the following fact sheets you were given: _____________________________________________________________________             [facility] - Preparing for Surgery Before surgery, you can play an important role.  Because skin is not sterile, your skin needs to be as free of germs as possible.  You can reduce the number of germs on your skin by washing with CHG (chlorahexidine gluconate) soap before surgery.  CHG is an antiseptic cleaner which kills germs and bonds with the skin to continue killing germs even after washing. Please DO NOT use if you have an allergy to CHG or antibacterial soaps.  If your skin becomes reddened/irritated stop using the CHG and inform your nurse when you arrive at Short Stay. Do not shave (including legs and underarms) for at least 48 hours prior to the first CHG shower.  You may shave your face/neck. Please follow these instructions carefully:  1.  Shower with  CHG Soap the night before surgery and the  morning of Surgery.  2.  If you choose to wash your hair, wash your hair first as usual with your  normal  shampoo.  3.  After you shampoo, rinse your hair and body thoroughly to remove the  shampoo.                           4.  Use CHG as you would any other liquid soap.  You can apply chg directly  to the skin and wash                       Gently with a scrungie or clean washcloth.  5.  Apply the CHG Soap  to your body ONLY FROM THE NECK DOWN.   Do not use on face/ open                           Wound or open sores. Avoid contact with eyes, ears mouth and genitals (private parts).                       Wash face,  Genitals (private parts) with your normal soap.             6.  Wash thoroughly, paying special attention to the area where your surgery  will be performed.  7.  Thoroughly rinse your body with warm water from the neck down.  8.  DO NOT shower/wash with your normal soap after using and rinsing off  the CHG Soap.                9.  Pat yourself dry with a clean towel.            10.  Wear clean pajamas.            11.  Place clean sheets on your bed the night of your first shower and do not  sleep with pets. Day of Surgery : Do not apply any lotions/deodorants the morning of surgery.  Please wear clean clothes to the hospital/surgery center.  FAILURE TO FOLLOW THESE INSTRUCTIONS MAY RESULT IN THE CANCELLATION OF YOUR SURGERY PATIENT SIGNATURE_________________________________  NURSE SIGNATURE__________________________________  ________________________________________________________________________

## 2018-05-10 ENCOUNTER — Encounter (HOSPITAL_COMMUNITY)
Admission: RE | Admit: 2018-05-10 | Discharge: 2018-05-10 | Disposition: A | Discharge status: Home / Self Care | Payer: PPO | Source: Ambulatory Visit | Attending: Urology | Admitting: Urology

## 2018-05-10 ENCOUNTER — Other Ambulatory Visit: Payer: Self-pay

## 2018-05-10 ENCOUNTER — Encounter (HOSPITAL_COMMUNITY): Payer: Self-pay

## 2018-05-10 DIAGNOSIS — R001 Bradycardia, unspecified: Secondary | ICD-10-CM | POA: Diagnosis not present

## 2018-05-10 DIAGNOSIS — N200 Calculus of kidney: Secondary | ICD-10-CM | POA: Insufficient documentation

## 2018-05-10 DIAGNOSIS — Z01818 Encounter for other preprocedural examination: Secondary | ICD-10-CM | POA: Diagnosis not present

## 2018-05-10 HISTORY — DX: Inflammatory liver disease, unspecified: K75.9

## 2018-05-10 LAB — COMPREHENSIVE METABOLIC PANEL
ALK PHOS: 67 U/L (ref 38–126)
ALT: 11 U/L (ref 0–44)
AST: 13 U/L — ABNORMAL LOW (ref 15–41)
Albumin: 4 g/dL (ref 3.5–5.0)
Anion gap: 8 (ref 5–15)
BILIRUBIN TOTAL: 0.7 mg/dL (ref 0.3–1.2)
BUN: 14 mg/dL (ref 6–20)
CALCIUM: 8.6 mg/dL — AB (ref 8.9–10.3)
CO2: 26 mmol/L (ref 22–32)
Chloride: 103 mmol/L (ref 98–111)
Creatinine, Ser: 0.99 mg/dL (ref 0.61–1.24)
GFR calc Af Amer: >60 mL/min (ref >60)
GFR calc non Af Amer: >60 mL/min (ref >60)
Glucose, Bld: 79 mg/dL (ref 70–99)
Potassium: 3.7 mmol/L (ref 3.5–5.1)
Sodium: 137 mmol/L (ref 135–145)
TOTAL PROTEIN: 7 g/dL (ref 6.5–8.1)

## 2018-05-10 LAB — CBC
HCT: 33.5 % — ABNORMAL LOW (ref 39.0–52.0)
Hemoglobin: 10.9 g/dL — ABNORMAL LOW (ref 13.0–17.0)
MCH: 29.9 pg (ref 26.0–34.0)
MCHC: 32.5 g/dL (ref 30.0–36.0)
MCV: 92 fL (ref 80.0–100.0)
Platelets: 289 10*3/uL (ref 150–400)
RBC: 3.64 MIL/uL — ABNORMAL LOW (ref 4.22–5.81)
RDW: 13.3 % (ref 11.5–15.5)
WBC: 4.9 10*3/uL (ref 4.0–10.5)
nRBC: 0 % (ref 0.0–0.2)

## 2018-05-10 LAB — ABO/RH: ABO/RH(D): A POS

## 2018-05-11 NOTE — Progress Notes (Signed)
PCP: [name]   CARDIOLOGIST:  none  INFO IN Epic: Hx of CHF on no meds ,pt. Denies history at preop. Hx viral hepatitis c no treatment. Sleep apnea no mask needed after weight loss surgery  INFO ON CHART:  lov note Dr. Lin Landsman  On chart  BLOOD THINNERS AND LAST DOSES:    None ____________________________________  PATIENT SYMPTOMS AT TIME OF PREOP:  none

## 2018-05-13 NOTE — Progress Notes (Addendum)
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 [name] on 05/17/2018.   Last seen by cardiology 08/08/15, seen by Dr. [name].  Asymptomatic at this visit. Echo ordered which shows EF of 62%, no valvular disease.  Negative stress test 2016.   Pt can proceed with planned procedure barring acute status change.  VS: BP 139/90   Pulse 66   Temp 36.5 C (Oral)   Resp 16   Ht 5\' 11"  (1.803 m)   Wt 100.7 kg   SpO2 97%   BMI 30.96 kg/m   PROVIDERS: [name], MD is PCP last seen 05/11/18  Ruta Hinds, MD is Vascular Surgeon  LABS: Labs reviewed: Acceptable for surgery. (all labs ordered are listed, but only abnormal results are displayed)  Labs Reviewed  COMPREHENSIVE METABOLIC PANEL - Abnormal; Notable for the following components:      Result Value   Calcium 8.6 (*)    AST 13 (*)    All other components within normal limits  CBC - Abnormal; Notable for the following components:   RBC 3.64 (*)    Hemoglobin 10.9 (*)    HCT 33.5 (*)    All other components within normal limits  TYPE AND SCREEN  ABO/RH     IMAGES:   EKG: 05/10/2018 Rate 58 bpm Sinus bradycardia  Otherwise normal ECG  CV: Stress Test 02/21/2015 No evidence of ischemia.  Preserved EF 54%.  No significant symptoms, EKG changes, or arrhythmias occurred Due to body habitus, it was fair quality  study.   Echo 08/21/2015 (On chart) 1. Left ventricle cavity is normal in size. Moderate concentric hypertrophy of the left ventricle.  Doppler evidence of grade I (impaired) diastolic dysfunction. Calculated EF 62% 2. Left atrial cavity is mild dilated.  3. Minimal aortic sclerosis, no stenosis or regurgitation 4. Normal right heart size/function, only trace TR, normal pulm artery pressure suggested  Past Medical History:  Diagnosis Date  . CHF (congestive heart failure) (HCC)    PT. DENIES AT PREOP  . Depression   . Heart murmur    hx of small murmur   . Hepatitis    Hepatitis C not treated for Hx of  . History of kidney stones   . Morbid obesity ([place])   . Pneumonia   . Sleep apnea    hx of had gastic bypass  . Venous insufficiency of leg   . Venous stasis dermatitis     Past Surgical History:  Procedure Laterality Date  . CARPAL TUNNEL RELEASE     bilateral   . CYSTOSCOPY W/ URETERAL STENT PLACEMENT Bilateral 04/21/2016   Procedure: CYSTOSCOPY WITH RETROGRADE PYELOGRAM/URETERAL STENT PLACEMENT;  Surgeon: [name], MD;  Location: [facility];  Service: Urology;  Laterality: Bilateral;  . CYSTOSCOPY/URETEROSCOPY/HOLMIUM LASER/STENT PLACEMENT Bilateral 04/01/2016   Procedure: CYSTOSCOPY WITH RETROGRADE AND  STENT PLACEMENT;  Surgeon: [name], MD;  Location:  [facility];  Service: Urology;  Laterality: Bilateral;  . CYSTOSCOPY/URETEROSCOPY/HOLMIUM LASER/STENT PLACEMENT Bilateral 04/21/2016   Procedure: CYSTOSCOPY/URETEROSCOPY/HOLMIUM LASER/STENT REPLACEMENT;  Surgeon: [name], MD;  Location: [facility];  Service: Urology;  Laterality: Bilateral;  . EYE SURGERY     bil cataracts  . gastric bypass surgery   10/2015  . HOLMIUM LASER APPLICATION Bilateral 12/15/4066   Procedure: HOLMIUM LASER APPLICATION;  Surgeon: [name], MD;  Location: [facility];  Service: Urology;  Laterality: Bilateral;    MEDICATIONS: . methadone (DOLOPHINE) 10 MG/5ML solution   No  current facility-administered medications for this encounter.     Maia Plan [facility] Pre-Surgical Testing [phone] 05/16/18 11:03 AM

## 2018-05-16 ENCOUNTER — Other Ambulatory Visit: Payer: Self-pay | Admitting: Physician Assistant

## 2018-05-16 NOTE — Anesthesia Preprocedure Evaluation (Addendum)
Anesthesia Evaluation  Patient identified by MRN, date of birth, ID band Patient awake    Reviewed: Allergy & Precautions, NPO status , Patient's Chart, lab work & pertinent test results  History of Anesthesia Complications Negative for: history of anesthetic complications  Airway Mallampati: II  TM Distance: >3 FB Neck ROM: Full    Dental  (+) Edentulous Upper, Edentulous Lower   Pulmonary sleep apnea , COPD, Current Smoker,    Pulmonary exam normal        Cardiovascular + Peripheral Vascular Disease  Normal cardiovascular exam     Neuro/Psych negative neurological ROS  negative psych ROS   GI/Hepatic negative GI ROS, (+)     substance abuse (methadone)  , Hepatitis -, C  Endo/Other  negative endocrine ROS  Renal/GU negative Renal ROS  negative genitourinary   Musculoskeletal negative musculoskeletal ROS (+) narcotic dependent  Abdominal   Peds  Hematology negative hematology ROS (+)   Anesthesia Other Findings 58 yo M for perc nephrostomy - PMH: current smoker, COPD HCV, chronic methadone use, morbid obesity s/p gastric bypass - followed by cardiology at [facility] for h/o LE edema, last visit in 2017 at which point he was aymptomatic; TTE showed normal EF and no valvular disease; negative stress test 2016  Reproductive/Obstetrics                           Anesthesia Physical Anesthesia Plan  ASA: III  Anesthesia Plan: General   Post-op Pain Management:    Induction: Intravenous  PONV Risk Score and Plan: 1 and Ondansetron, Dexamethasone, Midazolam and Treatment may vary due to age or medical condition  Airway Management Planned: Oral ETT  Additional Equipment: None  Intra-op Plan:   Post-operative Plan: Extubation in OR  Informed Consent: I have reviewed the patients History and Physical, chart, labs and discussed the procedure including the risks, benefits and alternatives for  the proposed anesthesia with the patient or authorized representative who has indicated his/her understanding and acceptance.     Dental advisory given  Plan Discussed with:   Anesthesia Plan Comments: (See PST note 05/10/2018, Konrad Felix, PA-C)       Anesthesia Quick Evaluation

## 2018-05-17 ENCOUNTER — Ambulatory Visit (HOSPITAL_COMMUNITY)
Admission: RE | Admit: 2018-05-17 | Discharge: 2018-05-17 | Disposition: A | Discharge status: Home / Self Care | Payer: PPO | Source: Ambulatory Visit | Attending: Urology | Admitting: Urology

## 2018-05-17 ENCOUNTER — Encounter (HOSPITAL_COMMUNITY): Payer: Self-pay | Admitting: *Deleted

## 2018-05-17 ENCOUNTER — Encounter (HOSPITAL_COMMUNITY)
Admission: RE | Disposition: A | Discharge status: Home / Self Care | Payer: Self-pay | Source: Ambulatory Visit | Attending: Urology

## 2018-05-17 ENCOUNTER — Observation Stay (HOSPITAL_COMMUNITY)
Admission: RE | Admit: 2018-05-17 | Discharge: 2018-05-18 | Disposition: A | Discharge status: Home / Self Care | Payer: PPO | Source: Ambulatory Visit | Attending: Urology | Admitting: Urology

## 2018-05-17 ENCOUNTER — Ambulatory Visit (HOSPITAL_COMMUNITY): Payer: PPO | Admitting: Anesthesiology

## 2018-05-17 ENCOUNTER — Other Ambulatory Visit: Payer: Self-pay

## 2018-05-17 ENCOUNTER — Observation Stay (HOSPITAL_COMMUNITY): Payer: PPO

## 2018-05-17 ENCOUNTER — Ambulatory Visit (HOSPITAL_COMMUNITY): Payer: PPO | Admitting: Physician Assistant

## 2018-05-17 DIAGNOSIS — Z9884 Bariatric surgery status: Secondary | ICD-10-CM | POA: Diagnosis not present

## 2018-05-17 DIAGNOSIS — Z79899 Other long term (current) drug therapy: Secondary | ICD-10-CM | POA: Insufficient documentation

## 2018-05-17 DIAGNOSIS — Z806 Family history of leukemia: Secondary | ICD-10-CM | POA: Insufficient documentation

## 2018-05-17 DIAGNOSIS — G4730 Sleep apnea, unspecified: Secondary | ICD-10-CM | POA: Diagnosis not present

## 2018-05-17 DIAGNOSIS — I509 Heart failure, unspecified: Secondary | ICD-10-CM | POA: Diagnosis not present

## 2018-05-17 DIAGNOSIS — Z87442 Personal history of urinary calculi: Secondary | ICD-10-CM | POA: Insufficient documentation

## 2018-05-17 DIAGNOSIS — R310 Gross hematuria: Secondary | ICD-10-CM | POA: Diagnosis not present

## 2018-05-17 DIAGNOSIS — Z436 Encounter for attention to other artificial openings of urinary tract: Secondary | ICD-10-CM | POA: Diagnosis not present

## 2018-05-17 DIAGNOSIS — R011 Cardiac murmur, unspecified: Secondary | ICD-10-CM | POA: Insufficient documentation

## 2018-05-17 DIAGNOSIS — Z800 Family history of malignant neoplasm of digestive organs: Secondary | ICD-10-CM | POA: Insufficient documentation

## 2018-05-17 DIAGNOSIS — F329 Major depressive disorder, single episode, unspecified: Secondary | ICD-10-CM | POA: Diagnosis not present

## 2018-05-17 DIAGNOSIS — Z4689 Encounter for fitting and adjustment of other specified devices: Secondary | ICD-10-CM | POA: Diagnosis not present

## 2018-05-17 DIAGNOSIS — B1920 Unspecified viral hepatitis C without hepatic coma: Secondary | ICD-10-CM | POA: Insufficient documentation

## 2018-05-17 DIAGNOSIS — K219 Gastro-esophageal reflux disease without esophagitis: Secondary | ICD-10-CM | POA: Diagnosis not present

## 2018-05-17 DIAGNOSIS — Z833 Family history of diabetes mellitus: Secondary | ICD-10-CM | POA: Insufficient documentation

## 2018-05-17 DIAGNOSIS — J449 Chronic obstructive pulmonary disease, unspecified: Secondary | ICD-10-CM | POA: Insufficient documentation

## 2018-05-17 DIAGNOSIS — I872 Venous insufficiency (chronic) (peripheral): Secondary | ICD-10-CM | POA: Insufficient documentation

## 2018-05-17 DIAGNOSIS — F1120 Opioid dependence, uncomplicated: Secondary | ICD-10-CM | POA: Diagnosis not present

## 2018-05-17 DIAGNOSIS — N209 Urinary calculus, unspecified: Secondary | ICD-10-CM | POA: Diagnosis not present

## 2018-05-17 DIAGNOSIS — F17210 Nicotine dependence, cigarettes, uncomplicated: Secondary | ICD-10-CM | POA: Diagnosis not present

## 2018-05-17 DIAGNOSIS — I878 Other specified disorders of veins: Secondary | ICD-10-CM | POA: Diagnosis not present

## 2018-05-17 DIAGNOSIS — Z6830 Body mass index (BMI) 30.0-30.9, adult: Secondary | ICD-10-CM | POA: Insufficient documentation

## 2018-05-17 DIAGNOSIS — N200 Calculus of kidney: Secondary | ICD-10-CM

## 2018-05-17 DIAGNOSIS — I110 Hypertensive heart disease with heart failure: Secondary | ICD-10-CM | POA: Diagnosis not present

## 2018-05-17 HISTORY — PX: NEPHROLITHOTOMY: SHX5134

## 2018-05-17 HISTORY — PX: IR URETERAL STENT LEFT NEW ACCESS W/O SEP NEPHROSTOMY CATH: IMG6075

## 2018-05-17 LAB — CBC WITH DIFFERENTIAL/PLATELET
Abs Immature Granulocytes: 0.02 10*3/uL (ref 0.00–0.07)
Basophils Absolute: 0 10*3/uL (ref 0.0–0.1)
Basophils Relative: 1 %
EOS PCT: 2 %
Eosinophils Absolute: 0.1 10*3/uL (ref 0.0–0.5)
HEMATOCRIT: 35.8 % — AB (ref 39.0–52.0)
HEMOGLOBIN: 11.7 g/dL — AB (ref 13.0–17.0)
Immature Granulocytes: 0 %
LYMPHS PCT: 24 %
Lymphs Abs: 1.6 10*3/uL (ref 0.7–4.0)
MCH: 30.5 pg (ref 26.0–34.0)
MCHC: 32.7 g/dL (ref 30.0–36.0)
MCV: 93.2 fL (ref 80.0–100.0)
Monocytes Absolute: 0.6 10*3/uL (ref 0.1–1.0)
Monocytes Relative: 8 %
Neutro Abs: 4.3 10*3/uL (ref 1.7–7.7)
Neutrophils Relative %: 65 %
Platelets: 265 10*3/uL (ref 150–400)
RBC: 3.84 MIL/uL — ABNORMAL LOW (ref 4.22–5.81)
RDW: 13.2 % (ref 11.5–15.5)
WBC: 6.6 10*3/uL (ref 4.0–10.5)
nRBC: 0 % (ref 0.0–0.2)

## 2018-05-17 LAB — BASIC METABOLIC PANEL
Anion gap: 8 (ref 5–15)
BUN: 14 mg/dL (ref 6–20)
CO2: 27 mmol/L (ref 22–32)
CREATININE: 0.89 mg/dL (ref 0.61–1.24)
Calcium: 8.9 mg/dL (ref 8.9–10.3)
Chloride: 101 mmol/L (ref 98–111)
GFR calc non Af Amer: >60 mL/min (ref >60)
Glucose, Bld: 68 mg/dL — ABNORMAL LOW (ref 70–99)
Potassium: 3.5 mmol/L (ref 3.5–5.1)
Sodium: 136 mmol/L (ref 135–145)

## 2018-05-17 LAB — HEMOGLOBIN AND HEMATOCRIT, BLOOD
HCT: 35.9 % — ABNORMAL LOW (ref 39.0–52.0)
Hemoglobin: 11.4 g/dL — ABNORMAL LOW (ref 13.0–17.0)

## 2018-05-17 LAB — TYPE AND SCREEN
ABO/RH(D): A POS
Antibody Screen: NEGATIVE

## 2018-05-17 LAB — PROTIME-INR
INR: 0.95
Prothrombin Time: 12.6 seconds (ref 11.4–15.2)

## 2018-05-17 LAB — GLUCOSE, CAPILLARY: Glucose-Capillary: 101 mg/dL — ABNORMAL HIGH (ref 70–99)

## 2018-05-17 SURGERY — NEPHROLITHOTOMY PERCUTANEOUS
Anesthesia: General | Laterality: Left

## 2018-05-17 MED ORDER — POTASSIUM CHLORIDE IN NACL 20-0.45 MEQ/L-% IV SOLN
INTRAVENOUS | Status: DC
  Administered 2018-05-17 – 2018-05-18 (×2): via INTRAVENOUS
  Filled 2018-05-17 (×4): qty 1000

## 2018-05-17 MED ORDER — SUGAMMADEX SODIUM 200 MG/2ML IV SOLN
INTRAVENOUS | Status: AC
  Filled 2018-05-17: qty 2

## 2018-05-17 MED ORDER — ARTIFICIAL TEARS OP OINT
TOPICAL_OINTMENT | OPHTHALMIC | Status: AC
  Filled 2018-05-17: qty 3.5

## 2018-05-17 MED ORDER — DIPHENHYDRAMINE HCL 50 MG/ML IJ SOLN: 12.5000 mg | Freq: Four times a day (QID) | INTRAMUSCULAR | Status: DC | PRN

## 2018-05-17 MED ORDER — FENTANYL CITRATE (PF) 100 MCG/2ML IJ SOLN
INTRAMUSCULAR | Status: DC | PRN
  Administered 2018-05-17 (×5): 50 ug via INTRAVENOUS

## 2018-05-17 MED ORDER — IOPAMIDOL (ISOVUE-300) INJECTION 61%
50.0000 mL | Freq: Once | INTRAVENOUS | Status: AC | PRN
  Administered 2018-05-17: 5 mL

## 2018-05-17 MED ORDER — SODIUM CHLORIDE 0.9 % IV SOLN: INTRAVENOUS | Status: DC

## 2018-05-17 MED ORDER — DEXAMETHASONE SODIUM PHOSPHATE 10 MG/ML IJ SOLN
INTRAMUSCULAR | Status: DC | PRN
  Administered 2018-05-17: 5 mg via INTRAVENOUS

## 2018-05-17 MED ORDER — ONDANSETRON HCL 4 MG/2ML IJ SOLN: 4.0000 mg | Freq: Once | INTRAMUSCULAR | Status: DC | PRN

## 2018-05-17 MED ORDER — MIDAZOLAM HCL 5 MG/5ML IJ SOLN
INTRAMUSCULAR | Status: DC | PRN
  Administered 2018-05-17: 1 mg via INTRAVENOUS

## 2018-05-17 MED ORDER — HYOSCYAMINE SULFATE 0.125 MG SL SUBL
0.1250 mg | SUBLINGUAL_TABLET | SUBLINGUAL | Status: DC | PRN
  Administered 2018-05-18: 0.125 mg via SUBLINGUAL
  Filled 2018-05-17 (×2): qty 1

## 2018-05-17 MED ORDER — CIPROFLOXACIN IN D5W 400 MG/200ML IV SOLN
400.0000 mg | Freq: Once | INTRAVENOUS | Status: AC
  Administered 2018-05-17: 400 mg via INTRAVENOUS
  Filled 2018-05-17: qty 200

## 2018-05-17 MED ORDER — ONDANSETRON HCL 4 MG/2ML IJ SOLN
INTRAMUSCULAR | Status: AC
  Filled 2018-05-17: qty 2

## 2018-05-17 MED ORDER — FENTANYL CITRATE (PF) 100 MCG/2ML IJ SOLN
INTRAMUSCULAR | Status: AC
  Filled 2018-05-17: qty 4

## 2018-05-17 MED ORDER — FENTANYL CITRATE (PF) 100 MCG/2ML IJ SOLN
INTRAMUSCULAR | Status: AC
  Filled 2018-05-17: qty 2

## 2018-05-17 MED ORDER — LIDOCAINE HCL (CARDIAC) PF 100 MG/5ML IV SOSY
PREFILLED_SYRINGE | INTRAVENOUS | Status: DC | PRN
  Administered 2018-05-17: 30 mg via INTRAVENOUS

## 2018-05-17 MED ORDER — HYDROMORPHONE HCL 1 MG/ML IJ SOLN
INTRAMUSCULAR | Status: AC
  Filled 2018-05-17: qty 1

## 2018-05-17 MED ORDER — LIDOCAINE HCL 1 % IJ SOLN
INTRAMUSCULAR | Status: AC
  Filled 2018-05-17: qty 20

## 2018-05-17 MED ORDER — HYDROMORPHONE HCL 1 MG/ML IJ SOLN
0.5000 mg | INTRAMUSCULAR | Status: DC | PRN
  Administered 2018-05-17 (×2): 1 mg via INTRAVENOUS
  Filled 2018-05-17 (×3): qty 1

## 2018-05-17 MED ORDER — LACTATED RINGERS IV SOLN
INTRAVENOUS | Status: DC
  Administered 2018-05-17: 09:00:00 via INTRAVENOUS

## 2018-05-17 MED ORDER — FENTANYL CITRATE (PF) 250 MCG/5ML IJ SOLN
INTRAMUSCULAR | Status: AC
  Filled 2018-05-17: qty 5

## 2018-05-17 MED ORDER — ROCURONIUM BROMIDE 100 MG/10ML IV SOLN
INTRAVENOUS | Status: DC | PRN
  Administered 2018-05-17: 50 mg via INTRAVENOUS

## 2018-05-17 MED ORDER — STERILE WATER FOR IRRIGATION IR SOLN
Status: DC | PRN
  Administered 2018-05-17: 500 mL

## 2018-05-17 MED ORDER — ONDANSETRON HCL 4 MG/2ML IJ SOLN: 4.0000 mg | INTRAMUSCULAR | Status: DC | PRN

## 2018-05-17 MED ORDER — FENTANYL CITRATE (PF) 100 MCG/2ML IJ SOLN
25.0000 ug | INTRAMUSCULAR | Status: DC | PRN
  Administered 2018-05-17 (×2): 50 ug via INTRAVENOUS

## 2018-05-17 MED ORDER — DIPHENHYDRAMINE HCL 12.5 MG/5ML PO ELIX: 12.5000 mg | ORAL_SOLUTION | Freq: Four times a day (QID) | ORAL | Status: DC | PRN

## 2018-05-17 MED ORDER — SENNOSIDES-DOCUSATE SODIUM 8.6-50 MG PO TABS
1.0000 | ORAL_TABLET | Freq: Every evening | ORAL | Status: DC | PRN
  Filled 2018-05-17: qty 1

## 2018-05-17 MED ORDER — SUGAMMADEX SODIUM 200 MG/2ML IV SOLN
INTRAVENOUS | Status: DC | PRN
  Administered 2018-05-17: 300 mg via INTRAVENOUS

## 2018-05-17 MED ORDER — LIDOCAINE 2% (20 MG/ML) 5 ML SYRINGE
INTRAMUSCULAR | Status: AC
  Filled 2018-05-17: qty 5

## 2018-05-17 MED ORDER — OXYCODONE HCL 5 MG/5ML PO SOLN
5.0000 mg | Freq: Once | ORAL | Status: DC | PRN
  Filled 2018-05-17: qty 5

## 2018-05-17 MED ORDER — BISACODYL 10 MG RE SUPP: 10.0000 mg | Freq: Every day | RECTAL | Status: DC | PRN

## 2018-05-17 MED ORDER — ONDANSETRON HCL 4 MG/2ML IJ SOLN
INTRAMUSCULAR | Status: DC | PRN
  Administered 2018-05-17: 4 mg via INTRAVENOUS

## 2018-05-17 MED ORDER — IOPAMIDOL (ISOVUE-300) INJECTION 61%
INTRAVENOUS | Status: AC
  Administered 2018-05-17: 5 mL
  Filled 2018-05-17: qty 50

## 2018-05-17 MED ORDER — OXYCODONE HCL 5 MG PO TABS: 5.0000 mg | ORAL_TABLET | Freq: Once | ORAL | Status: DC | PRN

## 2018-05-17 MED ORDER — HYDROMORPHONE HCL 1 MG/ML IJ SOLN
INTRAMUSCULAR | Status: AC
  Filled 2018-05-17: qty 2

## 2018-05-17 MED ORDER — MIDAZOLAM HCL 2 MG/2ML IJ SOLN
INTRAMUSCULAR | Status: AC
  Filled 2018-05-17: qty 2

## 2018-05-17 MED ORDER — ACETAMINOPHEN 325 MG PO TABS: 650.0000 mg | ORAL_TABLET | ORAL | Status: DC | PRN

## 2018-05-17 MED ORDER — MIDAZOLAM HCL 2 MG/2ML IJ SOLN
INTRAMUSCULAR | Status: AC | PRN
  Administered 2018-05-17 (×5): 1 mg via INTRAVENOUS

## 2018-05-17 MED ORDER — FLEET ENEMA 7-19 GM/118ML RE ENEM: 1.0000 | ENEMA | Freq: Once | RECTAL | Status: DC | PRN

## 2018-05-17 MED ORDER — ZOLPIDEM TARTRATE 5 MG PO TABS: 5.0000 mg | ORAL_TABLET | Freq: Every evening | ORAL | Status: DC | PRN

## 2018-05-17 MED ORDER — HYDROMORPHONE HCL 2 MG PO TABS: 2.0000 mg | ORAL_TABLET | ORAL | 0 refills | Status: DC | PRN

## 2018-05-17 MED ORDER — MIDAZOLAM HCL 2 MG/2ML IJ SOLN
INTRAMUSCULAR | Status: AC
  Filled 2018-05-17: qty 4

## 2018-05-17 MED ORDER — 0.9 % SODIUM CHLORIDE (POUR BTL) OPTIME
TOPICAL | Status: DC | PRN
  Administered 2018-05-17: 1000 mL

## 2018-05-17 MED ORDER — HYDROMORPHONE HCL 2 MG PO TABS
2.0000 mg | ORAL_TABLET | ORAL | Status: DC | PRN
  Administered 2018-05-17 – 2018-05-18 (×3): 2 mg via ORAL
  Filled 2018-05-17 (×3): qty 1

## 2018-05-17 MED ORDER — PROPOFOL 10 MG/ML IV BOLUS
INTRAVENOUS | Status: AC
  Filled 2018-05-17: qty 20

## 2018-05-17 MED ORDER — CEFAZOLIN SODIUM-DEXTROSE 2-4 GM/100ML-% IV SOLN
2.0000 g | INTRAVENOUS | Status: DC
  Filled 2018-05-17: qty 100

## 2018-05-17 MED ORDER — CEFAZOLIN SODIUM-DEXTROSE 2-4 GM/100ML-% IV SOLN
INTRAVENOUS | Status: AC
  Administered 2018-05-17: 2 g
  Filled 2018-05-17: qty 100

## 2018-05-17 MED ORDER — ROCURONIUM BROMIDE 100 MG/10ML IV SOLN
INTRAVENOUS | Status: AC
  Filled 2018-05-17: qty 1

## 2018-05-17 MED ORDER — NICOTINE 21 MG/24HR TD PT24
21.0000 mg | MEDICATED_PATCH | Freq: Every day | TRANSDERMAL | Status: DC
  Administered 2018-05-17: 21 mg via TRANSDERMAL
  Filled 2018-05-17: qty 1

## 2018-05-17 MED ORDER — SODIUM CHLORIDE 0.9 % IR SOLN
Status: DC | PRN
  Administered 2018-05-17: 6000 mL

## 2018-05-17 MED ORDER — ACETAMINOPHEN 10 MG/ML IV SOLN
1000.0000 mg | Freq: Four times a day (QID) | INTRAVENOUS | Status: DC
  Administered 2018-05-17 – 2018-05-18 (×3): 1000 mg via INTRAVENOUS
  Filled 2018-05-17 (×4): qty 100

## 2018-05-17 MED ORDER — PROPOFOL 10 MG/ML IV BOLUS
INTRAVENOUS | Status: DC | PRN
  Administered 2018-05-17: 170 mg via INTRAVENOUS

## 2018-05-17 MED ORDER — DEXTROSE 5 % AND 0.9 % NACL IV BOLUS
250.0000 mL | Freq: Once | INTRAVENOUS | Status: AC
  Administered 2018-05-17: 250 mL via INTRAVENOUS

## 2018-05-17 MED ORDER — HYDROMORPHONE HCL 1 MG/ML IJ SOLN
0.2500 mg | INTRAMUSCULAR | Status: DC | PRN
  Administered 2018-05-17 (×6): 0.5 mg via INTRAVENOUS

## 2018-05-17 MED ORDER — CEPHALEXIN 500 MG PO CAPS
500.0000 mg | ORAL_CAPSULE | Freq: Three times a day (TID) | ORAL | Status: DC
  Administered 2018-05-17 – 2018-05-18 (×3): 500 mg via ORAL
  Filled 2018-05-17 (×3): qty 1

## 2018-05-17 MED ORDER — FENTANYL CITRATE (PF) 100 MCG/2ML IJ SOLN
INTRAMUSCULAR | Status: AC | PRN
  Administered 2018-05-17 (×5): 50 ug via INTRAVENOUS

## 2018-05-17 SURGICAL SUPPLY — 44 items
BAG URINE DRAINAGE (UROLOGICAL SUPPLIES) ×3 IMPLANT
BASKET ZERO TIP NITINOL 2.4FR (BASKET) IMPLANT
BENZOIN TINCTURE PRP APPL 2/3 (GAUZE/BANDAGES/DRESSINGS) ×3 IMPLANT
BLADE SURG 15 STRL LF DISP TIS (BLADE) ×1 IMPLANT
BLADE SURG 15 STRL SS (BLADE) ×2
CATH AINSWORTH 30CC 24FR (CATHETERS) ×3 IMPLANT
CATH ROBINSON RED A/P 20FR (CATHETERS) IMPLANT
CATH URET 5FR 28IN OPEN ENDED (CATHETERS) IMPLANT
CATH URET DUAL LUMEN 6-10FR 50 (CATHETERS) ×3 IMPLANT
CATH UROLOGY TORQUE 40 (MISCELLANEOUS) ×3 IMPLANT
CATH X-FORCE N30 NEPHROSTOMY (TUBING) ×3 IMPLANT
COVER SURGICAL LIGHT HANDLE (MISCELLANEOUS) IMPLANT
COVER WAND RF STERILE (DRAPES) IMPLANT
DRAPE C-ARM 42X120 X-RAY (DRAPES) ×3 IMPLANT
DRAPE LINGEMAN PERC (DRAPES) ×3 IMPLANT
DRAPE SURG IRRIG POUCH 19X23 (DRAPES) ×3 IMPLANT
DRSG PAD ABDOMINAL 8X10 ST (GAUZE/BANDAGES/DRESSINGS) ×3 IMPLANT
DRSG TEGADERM 8X12 (GAUZE/BANDAGES/DRESSINGS) ×6 IMPLANT
EXTRACTOR STONE NITINOL NGAGE (UROLOGICAL SUPPLIES) IMPLANT
FIBER LASER FLEXIVA 365 (UROLOGICAL SUPPLIES) IMPLANT
FIBER LASER TRAC TIP (UROLOGICAL SUPPLIES) IMPLANT
GAUZE SPONGE 4X4 12PLY STRL (GAUZE/BANDAGES/DRESSINGS) ×3 IMPLANT
GLOVE SURG SS PI 8.0 STRL IVOR (GLOVE) ×3 IMPLANT
GOWN STRL REUS W/TWL XL LVL3 (GOWN DISPOSABLE) ×3 IMPLANT
GUIDEWIRE AMPLAZ .035X145 (WIRE) ×6 IMPLANT
KIT BASIN OR (CUSTOM PROCEDURE TRAY) ×3 IMPLANT
KIT PROBE TRILOGY 3.9X350 (MISCELLANEOUS) ×3 IMPLANT
MANIFOLD NEPTUNE II (INSTRUMENTS) ×3 IMPLANT
NS IRRIG 1000ML POUR BTL (IV SOLUTION) ×3 IMPLANT
PACK CYSTO (CUSTOM PROCEDURE TRAY) ×3 IMPLANT
PAD ABD 8X10 STRL (GAUZE/BANDAGES/DRESSINGS) ×3 IMPLANT
PROBE LITHOCLAST ULTRA 3.8X403 (UROLOGICAL SUPPLIES) IMPLANT
PROBE PNEUMATIC 1.0MMX570MM (UROLOGICAL SUPPLIES) IMPLANT
SET IRRIG Y TYPE TUR BLADDER L (SET/KITS/TRAYS/PACK) ×3 IMPLANT
SPONGE LAP 4X18 RFD (DISPOSABLE) ×3 IMPLANT
SUT SILK 2 0 30  PSL (SUTURE) ×2
SUT SILK 2 0 30 PSL (SUTURE) ×1 IMPLANT
SYR 10ML LL (SYRINGE) ×3 IMPLANT
SYR 20CC LL (SYRINGE) ×6 IMPLANT
TOWEL OR 17X26 10 PK STRL BLUE (TOWEL DISPOSABLE) ×3 IMPLANT
TOWEL OR NON WOVEN STRL DISP B (DISPOSABLE) ×3 IMPLANT
TRAY FOLEY MTR SLVR 16FR STAT (SET/KITS/TRAYS/PACK) ×3 IMPLANT
TUBING CONNECTING 10 (TUBING) ×4 IMPLANT
TUBING CONNECTING 10' (TUBING) ×2

## 2018-05-17 NOTE — Anesthesia Procedure Notes (Signed)
Procedure Name: Intubation Date/Time: 05/17/2018 11:42 AM Performed by: Garrel Ridgel, CRNA Pre-anesthesia Checklist: Patient identified, Emergency Drugs available, Suction available, Patient being monitored and Timeout performed Patient Re-evaluated:Patient Re-evaluated prior to induction Oxygen Delivery Method: Circle system utilized Preoxygenation: Pre-oxygenation with 100% oxygen Induction Type: IV induction Ventilation: Mask ventilation without difficulty Laryngoscope Size: Mac and 3 Grade View: Grade I Tube type: Oral Number of attempts: 1 Airway Equipment and Method: Stylet Placement Confirmation: ETT inserted through vocal cords under direct vision,  positive ETCO2 and breath sounds checked- equal and bilateral Secured at: 23 cm Tube secured with: Tape Dental Injury: Teeth and Oropharynx as per pre-operative assessment

## 2018-05-17 NOTE — Procedures (Signed)
L PCN 5 Fr EBL 0 Comp 0

## 2018-05-17 NOTE — Op Note (Signed)
Procedure: 1.  Left percutaneous nephrolithotomy for 2.4 cm stone. 2.  Left antegrade nephrostogram through existing tract with interpretation.  Preop diagnosis: 2.4 cm left lower pole renal stone.  Postop diagnosis: Same.  Surgeon: Dr. [name].  Anesthesia: General.  Specimens Stone fragments.  Drains: 1.  16 French Foley catheter. 2.  24 French Ainsworth left nephrostomy tube. 3.  6 [place] safety catheter.  EBL: 50 mL.  Complications: None.  Indications: [name] is a 58 year old male with a 2.4 cm left lower pole partial staghorn stone that was felt to be best managed with a left percutaneous nephrolithotomy.  Procedure: He was given Cipro in IR and successful placement of a left lower pole percutaneous access was made.  He was taken the operating room where general anesthetic was induced on the holding room stretcher.  His genitalia was prepped with Betadine solution and a Foley catheter was inserted without difficulty.  The balloon was filled with 10 mL of sterile fluid and the cath was placed to straight drainage.  He was then rolled prone on chest rolls on the operating table with great care taken to pad all pressure points.  He was fitted with PAS hose.  The left nephrostomy access site was prepped with Betadine solution and draped in usual sterile fashion.  An Amplatz superstiff wire was placed through the nephrostomy catheter to the bladder and the catheter was removed.  The access site incision was extended to 2 cm with a knife.  The dual-lumen 8 French catheter was passed over the wire into the proximal ureter and a second Super Stiff wire was passed into the bladder.  The dual-lumen catheter was removed.  The NephroMax access balloon was then passed over the wire with the tip placed in the renal pelvis under fluoroscopic guidance.  The balloon was inflated to 20 atm and the sheath was then passed.  The balloon was deflated and removed.  There was minimal bleeding.  The  rigid nephroscope was then passed and the patient was noted to have multiple small stones which were removed with grasping forceps, irrigation and the trilogy ultrasonic lithotrite.  There was one large stone noted and it was successfully removed with the graspers.  At this point fluoroscopic inspection demonstrated no obvious residual stones.  A fresh 6 French access catheter was placed over the safety wire and positioned just above the bladder.  A 24 [place] Ainsworth Foley catheter was placed through the access sheath over the working wire into the renal pelvis.  The sheath was backed out and the catheter balloon was filled with 2 mL of sterile water.  Contrast was instilled and the nephrostomy catheter appeared to be in good position with extravasation only out along the tract.  The safety catheter and nephrostomy catheter were secured to the skin with a 2-0 silk suture.  The wires were removed and final contrast injection was performed.  The antegrade nephrostogram revealed the tip of the nephrostomy tube in good position.  There was antegrade flow, there was a lucent filling defect in the lower pole consistent with clot.  There was no further extravasation.  The access sheath was cut away from the Foley.  The safety catheter was capped.  The Foley cath was placed to straight drainage.  A dressing of 4 x 4's and ABDs was applied and secured.  The patient was then rolled back supine on the recovery room stretcher.  His anesthetic was reversed.  He was moved recovery in stable condition.  There were no complications.

## 2018-05-17 NOTE — Sedation Documentation (Signed)
D5/NS 250cc bolus given as ordered and complete. CBG 101. Dr Barbie Banner made aware. Patient VSS and without complaints.

## 2018-05-17 NOTE — Transfer of Care (Signed)
Immediate Anesthesia Transfer of Care Note  Patient: [name]  Procedure(s) Performed: NEPHROLITHOTOMY PERCUTANEOUS (Left )  Patient Location: PACU  Anesthesia Type:General  Level of Consciousness: awake, alert , oriented and patient cooperative  Airway & Oxygen Therapy: Patient Spontanous Breathing and Patient connected to face mask oxygen  Post-op Assessment: Report given to RN and Post -op Vital signs reviewed and stable  Post vital signs: Reviewed and stable  Last Vitals:  Vitals Value Taken Time  BP 158/100 05/17/2018  1:00 PM  Temp    Pulse 71 05/17/2018  1:01 PM  Resp 13 05/17/2018  1:01 PM  SpO2 100 % 05/17/2018  1:01 PM  Vitals shown include unvalidated device data.  Last Pain:  Vitals:   05/17/18 0828  TempSrc: Oral         Complications: No apparent anesthesia complications

## 2018-05-17 NOTE — Anesthesia Procedure Notes (Signed)
Procedure Name: Intubation Date/Time: 05/17/2018 11:42 AM Performed by: Garrel Ridgel, CRNA Pre-anesthesia Checklist: Patient identified, Emergency Drugs available, Suction available, Patient being monitored and Timeout performed Patient Re-evaluated:Patient Re-evaluated prior to induction Oxygen Delivery Method: Circle system utilized Preoxygenation: Pre-oxygenation with 100% oxygen Induction Type: IV induction Ventilation: Mask ventilation without difficulty Laryngoscope Size: Mac and 4 Grade View: Grade I Tube type: Oral Number of attempts: 1 Airway Equipment and Method: Patient positioned with wedge pillow Secured at: 23 cm Tube secured with: Tape Dental Injury: Teeth and Oropharynx as per pre-operative assessment

## 2018-05-17 NOTE — H&P (Signed)
CC: I have blood in my urine.  HPI: [name] is a 58 year-old male established patient who is here for blood in the urine.  He did see the blood in his urine.   [name] is a 58 yo WM who is a former patient of Dr. Pilar Jarvis. He has a history of stones and had ureteroscopy in 2017. He passed a stone a week ago but has had some persistent gross hematuria. He had some urgency before he passed the stone. He has no flank pain now. He has no associate signs or symptoms.      CC: AUA Questions Scoring.  HPI:     AUA Symptom Score: Less than 20% of the time he has the sensation of not emptying his bladder completely when finished urinating. Less than 50% of the time he has to urinate again fewer than two hours after he has finished urinating. Less than 20% of the time he has to start and stop again several times when he urinates. 50% of the time he finds it difficult to postpone urination. 50% of the time he has a weak urinary stream. More than 50% of the time he has to push or strain to begin urination. He has to get up to urinate 2 times from the time he goes to bed until the time he gets up in the morning.   Calculated AUA Symptom Score: 16    ALLERGIES: None   MEDICATIONS: Methadone Hcl 40 mg tablet, soluble  Torsemide 20 mg tablet     GU PSH: Cysto Remove Stent FB Sim - 04/28/2016 Cystoscopy Insert Stent, Bilateral - 04/01/2016 Ureteroscopic laser litho, Bilateral - 2018    NON-GU PSH: Carpal Tunnel Surgery.Donald Pore Duodenal Switch    GU PMH: BPH w/o LUTS - 08/10/2017, - 03/20/2016 Encounter for Prostate Cancer screening - 08/10/2017 Renal calculus - 08/10/2017, - 04/28/2016, - 03/20/2016    NON-GU PMH: Acute gastric ulcer with hemorrhage Cardiac murmur, unspecified GERD Sleep Apnea    FAMILY HISTORY: Colon Cancer - Father Diabetes - Father leukemia - Mother   SOCIAL HISTORY: Marital Status: Divorced Preferred Language: English; Ethnicity: Not Hispanic Or Latino; Race:  White Current Smoking Status: Patient smokes.  Has never drank.  Patient's occupation is/was disabled.    REVIEW OF SYSTEMS:    GU Review Male:   Patient denies frequent urination, hard to postpone urination, burning/ pain with urination, get up at night to urinate, leakage of urine, stream starts and stops, trouble starting your stream, have to strain to urinate , erection problems, and penile pain.  Gastrointestinal (Upper):   Patient reports nausea and vomiting. Patient denies indigestion/ heartburn.  Gastrointestinal (Lower):   Patient reports diarrhea. Patient denies constipation.  Constitutional:   Patient denies fever, night sweats, weight loss, and fatigue.  Skin:   Patient denies skin rash/ lesion and itching.  Eyes:   Patient denies blurred vision and double vision.  Ears/ Nose/ Throat:   Patient denies sore throat and sinus problems.  Hematologic/Lymphatic:   Patient denies swollen glands and easy bruising.  Cardiovascular:   Patient reports leg swelling. Patient denies chest pains.  Respiratory:   Patient denies cough and shortness of breath.  Endocrine:   Patient denies excessive thirst.  Musculoskeletal:   Patient denies back pain and joint pain.  Neurological:   Patient denies headaches and dizziness.  Psychologic:   Patient denies depression and anxiety.   VITAL SIGNS:      04/20/2018 08:24 AM  Weight 230 lb /  104.33 kg  Height 72 in / 182.88 cm  BP 107/69 mmHg  Pulse 67 /min  Temperature 97.3 F / 36.2 C  BMI 31.2 kg/m   MULTI-SYSTEM PHYSICAL EXAMINATION:    Constitutional: Well-nourished. No physical deformities. Normally developed. Good grooming.  Neck: Neck symmetrical, not swollen. Normal tracheal position.  Respiratory: Normal breath sounds. No labored breathing, no use of accessory muscles.   Cardiovascular: Regular rate and rhythm. No murmur, no gallop. gynecomastia  Lymphatic: No enlargement, no tenderness of supraclavicular or neck lymph nodes.  Skin: No  paleness, no jaundice, no cyanosis. No lesion, no ulcer, no rash.  Neurologic / Psychiatric: Oriented to time, oriented to place, oriented to person. No depression, no anxiety, no agitation.  Gastrointestinal: No mass, no tenderness, no rigidity, non obese abdomen.  Musculoskeletal: Normal gait and station of head and neck.     PAST DATA REVIEWED:  Source Of History:  Patient  Lab Test Review:   BUN/Creatinine  Records Review:   AUA Symptom Score, Previous Patient Records  Urine Test Review:   Urinalysis  X-Ray Review: C.T. Hematuria: Reviewed Films. Discussed With Patient.     08/10/17  PSA  Total PSA 0.052 ng/mL    PROCEDURES:         C.T. Hematuria - 74178, B0175      OMNIPAQUE 300 125CC         Urinalysis w/Scope Dipstick Dipstick Cont'd Micro  Color: Amber Bilirubin: Neg mg/dL WBC/hpf: NS (Not Seen)  Appearance: Cloudy Ketones: Trace mg/dL RBC/hpf: >60/hpf  Specific Gravity: 1.020 Blood: 3+ ery/uL Bacteria: Few (10-25/hpf)  pH: <=5.0 Protein: Neg mg/dL Cystals: NS (Not Seen)  Glucose: Neg mg/dL Urobilinogen: 0.2 mg/dL Casts: NS (Not Seen)    Nitrites: Neg Trichomonas: Not Present    Leukocyte Esterase: Neg leu/uL Mucous: Present      Epithelial Cells: 0 - 5/hpf      Yeast: NS (Not Seen)      Sperm: Not Present    ASSESSMENT:      ICD-10 Details  1 GU:   Gross hematuria - R31.0 He had recent painless hematuria. He has a tiny bladder stone that might have caused the bleeding, but he has metabolically active renal stone disease with a 3cm LLP partial staghorn that could also be the source of the bleeding. there is a RLP stone that is smaller but has grown since his last imaging.   2   Renal calculus - N20.0 He needs a left PCNL for this stone. I reviewed the risks of bleeding, infection, injury to the kidney and adjacent structures, renal loss, need for secondary procedures, urine leaks, thrombotic events and anesthetic complications. I will get him set up in the near  future.   3   Bladder Stone - N21.0 I will cysto him at the time of the PCNL.   PLAN:           Orders Labs BUN/Creatinine(Stat)  X-Rays: C.T. Hematuria With and Without I.V. Contrast  X-Ray Notes: History:  Hematuria: Yes/No  Patient to see MD after exam: Yes/No  Previous exam: CT / IVP/ US/ KUB/ None  When:  Where:  Diabetic: Yes/ No  BUN/ Creatinine:  Date of last BUN Creatinine:  Weight in pounds:  Allergy- IV Contrast: Yes/ No  Conflicting diabetic meds: Yes/ No  Diabetic Meds:  Prior Authorization #: [id number]            Schedule Return Visit/Planned Activity: Next Available Appointment - Schedule Surgery  Procedure:  Unspecified Date - PCNL - 50080, left Notes: Next available          Document Letter(s):  Created for Patient: Clinical Summary

## 2018-05-17 NOTE — Consult Note (Signed)
Chief Complaint: Patient was seen in consultation today for left percutaneous nephrostomy/nephroureteral catheter placement   Referring Physician(s): Wrenn,J  Supervising Physician: Marybelle Killings  Patient Status: [facility] - Out-pt TBA  History of Present Illness: [name] is a 58 y.o. male with history of painless hematuria, bladder stone, nephrolithiasis with 3 cm left lower pole partial staghorn as well as small right lower pole stone who presents today for left percutaneous nephrostomy/nephroureteral catheter placement prior to nephrolithotomy.  Past Medical History:  Diagnosis Date  . CHF (congestive heart failure) (HCC)    PT. DENIES AT [facility]  . Depression   . Heart murmur    hx of small murmur   . Hepatitis    Hepatitis C not treated for Hx of  . History of kidney stones   . Morbid obesity ([place])   . Pneumonia   . Sleep apnea    hx of had gastic bypass  . Venous insufficiency of leg   . Venous stasis dermatitis     Past Surgical History:  Procedure Laterality Date  . CARPAL TUNNEL RELEASE     bilateral   . CYSTOSCOPY W/ URETERAL STENT PLACEMENT Bilateral 04/21/2016   Procedure: CYSTOSCOPY WITH RETROGRADE PYELOGRAM/URETERAL STENT PLACEMENT;  Surgeon: [name], MD;  Location: [facility];  Service: Urology;  Laterality: Bilateral;  . CYSTOSCOPY/URETEROSCOPY/HOLMIUM LASER/STENT PLACEMENT Bilateral 04/01/2016   Procedure: CYSTOSCOPY WITH RETROGRADE AND  STENT PLACEMENT;  Surgeon: [name], MD;  Location: [facility];  Service: Urology;  Laterality: Bilateral;  . CYSTOSCOPY/URETEROSCOPY/HOLMIUM LASER/STENT PLACEMENT Bilateral 04/21/2016   Procedure: CYSTOSCOPY/URETEROSCOPY/HOLMIUM LASER/STENT REPLACEMENT;  Surgeon: [name], MD;  Location: [facility];  Service: Urology;  Laterality: Bilateral;  . EYE SURGERY     bil cataracts  . gastric bypass surgery   10/2015  . HOLMIUM LASER APPLICATION Bilateral 12/13/3298   Procedure: HOLMIUM LASER APPLICATION;   Surgeon: [name], MD;  Location: [facility];  Service: Urology;  Laterality: Bilateral;    Allergies: Patient has no known allergies.  Medications: Prior to Admission medications   Medication Sig Start Date End Date Taking? Authorizing Provider  methadone (DOLOPHINE) 10 MG/5ML solution Take 205 mg by mouth daily.    Yes [provider]     Family History  Problem Relation Age of Onset  . Leukemia Mother        passed away 08-02-08  . Diabetes Father   . Heart disease Father   . Hypertension Father   . Colon cancer Father     Social History   Socioeconomic History  . Marital status: Legally Separated    Spouse name: Not on file  . Number of children: Not on file  . Years of education: Not on file  . Highest education level: Not on file  Occupational History  . Not on file  Social Needs  . Financial resource strain: Not on file  . Food insecurity:    Worry: Not on file    Inability: Not on file  . Transportation needs:    Medical: Not on file    Non-medical: Not on file  Tobacco Use  . Smoking status: Current Some Day Smoker    Packs/day: 0.50    Years: 35.00    Pack years: 17.50    Types: Cigarettes    Last attempt to quit: 05/17/2015    Years since quitting: 3.0  . Smokeless tobacco: Never Used  . Tobacco comment: currently only smoking 0.5 ppd  Substance and Sexual  Activity  . Alcohol use: No  . 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 Sexual activity: Not Currently  Lifestyle  . Physical activity:    Days per week: Not on file    Minutes per session: Not on file  . Stress: Not on file  Relationships  . Social connections:    Talks on phone: Not on file    Gets together: Not on file    Attends religious service: Not on file    Active member of club or organization: Not on file    Attends meetings of clubs or organizations: Not on file    Relationship status: Not on  file  Other Topics Concern  . Not on file  Social History Narrative   Divorced man.  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  Lives in [place], [place].  4 children all in good health.  3 sisters in good health.  Father in poor health near by      Review of Systems denies fever, headache, chest pain, dyspnea, cough, abdominal pain, nausea, vomiting.  He has had back pain and hematuria.  Vital Signs: BP 139/87   Pulse 73   Temp 98 F (36.7 C) (Oral)   Resp 20   SpO2 100%   Physical Exam awake, alert.  Chest clear to auscultation bilaterally.  Heart with regular rate and rhythm.  Abdomen soft, positive bowel sounds, nontender. Chronic venous stasis/edema both lower extremities  Imaging: No results found.  Labs:  CBC: Recent Labs    05/10/18 1155  WBC 4.9  HGB 10.9*  HCT 33.5*  PLT 289    COAGS: No results for input(s): INR, APTT in the last 8760 hours.  BMP: Recent Labs    05/10/18 1155  NA 137  K 3.7  CL 103  CO2 26  GLUCOSE 79  BUN 14  CALCIUM 8.6*  CREATININE 0.99  GFRNONAA >60  GFRAA >60    LIVER FUNCTION TESTS: Recent Labs    05/10/18 1155  BILITOT 0.7  AST 13*  ALT 11  ALKPHOS 67  PROT 7.0  ALBUMIN 4.0    TUMOR MARKERS: No results for input(s): AFPTM, CEA, CA199, CHROMGRNA in the last 8760 hours.  Assessment and Plan: 58 y.o. male with history of painless hematuria, bladder stone, nephrolithiasis with 3 cm left lower pole partial staghorn as well as small right lower pole stone who presents today for left percutaneous nephrostomy/nephroureteral catheter placement prior to nephrolithotomy.Risks and benefits of procedure were discussed with the patient including, but not limited to, infection, bleeding, significant bleeding causing loss or decrease in renal function or damage to adjacent structures.   All of the patient's questions were answered, patient is agreeable to proceed.  Consent signed and  in chart.  LABS PENDING    Thank you for this interesting consult.  I greatly enjoyed meeting Tex Conroy. and look forward to participating in their care.  A copy of this report was sent to the requesting provider on this date.  Electronically Signed: D. Rowe Robert, PA-C 05/17/2018, 9:00 AM   I spent a total of  25 minutes   in face to face in clinical consultation, greater than 50% of which was counseling/coordinating care for left percutaneous nephrostomy/nephroureteral catheter placement

## 2018-05-17 NOTE — Anesthesia Postprocedure Evaluation (Signed)
Anesthesia Post Note  Patient: [name]  Procedure(s) Performed: NEPHROLITHOTOMY PERCUTANEOUS (Left )     Patient location during evaluation: PACU Anesthesia Type: General Level of consciousness: awake Pain management: pain level controlled Vital Signs Assessment: post-procedure vital signs reviewed and stable Respiratory status: spontaneous breathing Cardiovascular status: stable Postop Assessment: no apparent nausea or vomiting Anesthetic complications: no    Last Vitals:  Vitals:   05/17/18 1430 05/17/18 1500  BP: (!) 133/92 121/87  Pulse: 60 (!) 55  Resp: 15 19  Temp: 36.6 C 36.7 C  SpO2: 100% 94%    Last Pain:  Vitals:   05/17/18 1547  TempSrc:   PainSc: 8                  Jaymen Fetch

## 2018-05-17 NOTE — Discharge Instructions (Signed)
Moderate Conscious Sedation, Adult, Care After °These instructions provide you with information about caring for yourself after your procedure. Your health care provider may also give you more specific instructions. Your treatment has been planned according to current medical practices, but problems sometimes occur. Call your health care provider if you have any problems or questions after your procedure. °What can I expect after the procedure? °After your procedure, it is common: °· To feel sleepy for several hours. °· To feel clumsy and have poor balance for several hours. °· To have poor judgment for several hours. °· To vomit if you eat too soon. °Follow these instructions at home: °For at least 24 hours after the procedure: ° °· Do not: °? Participate in activities where you could fall or become injured. °? Drive. °? Use heavy machinery. °? Drink alcohol. °? Take sleeping pills or medicines that cause drowsiness. °? Make important decisions or sign legal documents. °? Take care of children on your own. °· Rest. °Eating and drinking °· Follow the diet recommended by your health care provider. °· If you vomit: °? Drink water, juice, or soup when you can drink without vomiting. °? Make sure you have little or no nausea before eating solid foods. °General instructions °· Have a responsible adult stay with you until you are awake and alert. °· Take over-the-counter and prescription medicines only as told by your health care provider. °· If you smoke, do not smoke without supervision. °· Keep all follow-up visits as told by your health care provider. This is important. °Contact a health care provider if: °· You keep feeling nauseous or you keep vomiting. °· You feel light-headed. °· You develop a rash. °· You have a fever. °Get help right away if: °· You have trouble breathing. °This information is not intended to replace advice given to you by your health care provider. Make sure you discuss any questions you have  with your health care provider. °Document Released: 01/18/2013 Document Revised: 09/02/2015 Document Reviewed: 07/20/2015 °Elsevier Interactive Patient Education © 2019 [employer] ° ° °Percutaneous Nephrostomy, Care After °This sheet gives you information about how to care for yourself after your procedure. Your health care provider may also give you more specific instructions. If you have problems or questions, contact your health care provider. °What can I expect after the procedure? °After the procedure, it is common to have: °· Some soreness where the nephrostomy tube was inserted (tube insertion site). °· Blood-tinged drainage from the nephrostomy tube for the first 24 hours. °Follow these instructions at home: °Activity °· Return to your normal activities as told by your health care provider. Ask your health care provider what activities are safe for you. °· Avoid activities that may cause the nephrostomy tubing to bend. °· Do not take baths, swim, or use a hot tub until your health care provider approves. Ask your health care provider if you can take showers. Cover the nephrostomy tube dressing with a watertight covering when you take a shower. °· Do not drive for 24 hours if you were given a medicine to help you relax (sedative). °Care of the tube insertion site ° °· Follow instructions from your health care provider about how to take care of your tube insertion site. Make sure you: °? Wash your hands with soap and water before you change your bandage (dressing). If soap and water are not available, use hand sanitizer. °? Change your dressing as told by your health care provider. Be careful not to pull   on the tube while removing the dressing. °? When you change the dressing, wash the skin around the tube, rinse well, and pat the skin dry. °· Check the tube insertion area every day for signs of infection. Check for: °? More redness, swelling, or pain. °? More fluid or blood. °? Warmth. °? Pus or a bad  smell. °Care of the nephrostomy tube and drainage bag °· Always keep the tubing, the leg bag, or the bedside drainage bags below the level of the kidney so that your urine drains freely. °· When connecting your nephrostomy tube to a drainage bag, make sure that there are no kinks in the tubing and that your urine is draining freely. You may want to use an elastic bandage to wrap any exposed tubing that goes from the nephrostomy tube to any of the connecting tubes. °· At night, you may want to connect your nephrostomy tube or the leg bag to a larger bedside drainage bag. °· Follow instructions from your health care provider about how to empty or change the drainage bag. °· Empty the drainage bag when it becomes ? full. °· Replace the drainage bag and any extension tubing that is connected to your nephrostomy tube every 3 weeks or as often as told by your health care provider. Your health care provider will explain how to change the drainage bag and extension tubing. °General instructions °· Take over-the-counter and prescription medicines only as told by your health care provider. °· Keep all follow-up visits as told by your health care provider. This is important. °Contact a health care provider if: °· You have problems with any of the valves or tubing. °· You have persistent pain or soreness in your back. °· You have more redness, swelling, or pain around your tube insertion site. °· You have more fluid or blood coming from your tube insertion site. °· Your tube insertion site feels warm to the touch. °· You have pus or a bad smell coming from your tube insertion site. °· You have increased urine output or you feel burning when urinating. °Get help right away if: °· You have pain in your abdomen during the first week. °· You have chest pain or have trouble breathing. °· You have a new appearance of blood in your urine. °· You have a fever or chills. °· You have back pain that is not relieved by your  medicine. °· You have decreased urine output. °· Your nephrostomy tube comes out. °This information is not intended to replace advice given to you by your health care provider. Make sure you discuss any questions you have with your health care provider. °Document Released: 11/21/2003 Document Revised: 01/10/2016 Document Reviewed: 01/10/2016 °Elsevier Interactive Patient Education © 2019 Elsevier Inc. ° ° °

## 2018-05-17 NOTE — Progress Notes (Signed)
Patient is doing well overall post left PCNL.  Hemoglobin is stable.  He is having issues with pain control but due to have additional Dilaudid.  Foley catheter as well as nephrostomy tube draining light red urine.  Good urine output  Will reassess tomorrow

## 2018-05-18 ENCOUNTER — Encounter (HOSPITAL_COMMUNITY): Payer: Self-pay | Admitting: Urology

## 2018-05-18 ENCOUNTER — Observation Stay (HOSPITAL_COMMUNITY): Payer: PPO

## 2018-05-18 DIAGNOSIS — N201 Calculus of ureter: Secondary | ICD-10-CM | POA: Diagnosis not present

## 2018-05-18 DIAGNOSIS — N200 Calculus of kidney: Secondary | ICD-10-CM | POA: Diagnosis not present

## 2018-05-18 LAB — HIV ANTIBODY (ROUTINE TESTING W REFLEX): HIV SCREEN 4TH GENERATION: NONREACTIVE

## 2018-05-18 LAB — HEMOGLOBIN AND HEMATOCRIT, BLOOD
HCT: 32.4 % — ABNORMAL LOW (ref 39.0–52.0)
Hemoglobin: 10.5 g/dL — ABNORMAL LOW (ref 13.0–17.0)

## 2018-05-18 NOTE — Discharge Summary (Signed)
Physician Discharge Summary  Patient ID: [name] MRN: [id number] DOB/AGE: 1960-08-14 58 y.o.  Admit date: 05/17/2018 Discharge date: 05/18/2018  Admission Diagnoses:  Left renal stone  Discharge Diagnoses:  Principal Problem:   Left renal stone   Past Medical History:  Diagnosis Date  . CHF (congestive heart failure) (HCC)    PT. DENIES AT [facility]  . Depression   . Heart murmur    hx of small murmur   . Hepatitis    Hepatitis C not treated for Hx of  . History of kidney stones   . Morbid obesity ([place])   . Pneumonia   . Sleep apnea    hx of had gastic bypass  . Venous insufficiency of leg   . Venous stasis dermatitis     Surgeries: Procedure(s): NEPHROLITHOTOMY PERCUTANEOUS on 05/17/2018   Consultants (if any):   Discharged Condition: Improved  Hospital Course: Umair Rosiles. is an 58 y.o. male who was admitted 05/17/2018 with a diagnosis of Left renal stone and went to the operating room on 05/17/2018 and underwent the above named procedures.  KUB this morning shows no residual stones.   Pt would like to be discharged.  Tube is draining well with minimal hematuria.   He has minimal pain.  Hgb is minimally decreased at 10.5.   He was given perioperative antibiotics:  Anti-infectives (From admission, onward)   Start     Dose/Rate Route Frequency Ordered Stop   05/17/18 1600  cephALEXin (KEFLEX) capsule 500 mg     500 mg Oral 3 times daily 05/17/18 1341     05/17/18 0830  ciprofloxacin (CIPRO) IVPB 400 mg     400 mg 200 mL/hr over 60 Minutes Intravenous  Once 05/17/18 0820 05/17/18 1157   05/17/18 0820  ceFAZolin (ANCEF) IVPB 2g/100 mL premix  Status:  Discontinued     2 g 200 mL/hr over 30 Minutes Intravenous 30 min pre-op 05/17/18 0820 05/17/18 1341    .  He was given sequential compression devices for DVT prophylaxis.  He benefited maximally from the hospital stay and there were no complications.    Recent vital signs:  Vitals:   05/17/18 2148 05/18/18  0502  BP:  136/81  Pulse: (!) 57 (!) 52  Resp:  16  Temp:  97.9 F (36.6 C)  SpO2:  100%    Recent laboratory studies:  Lab Results  Component Value Date   HGB 10.5 (L) 05/18/2018   HGB 11.4 (L) 05/17/2018   HGB 11.7 (L) 05/17/2018   Lab Results  Component Value Date   WBC 6.6 05/17/2018   PLT 265 05/17/2018   Lab Results  Component Value Date   INR 0.95 05/17/2018   Lab Results  Component Value Date   NA 136 05/17/2018   K 3.5 05/17/2018   CL 101 05/17/2018   CO2 27 05/17/2018   BUN 14 05/17/2018   CREATININE 0.89 05/17/2018   GLUCOSE 68 (L) 05/17/2018    Discharge Medications:   Allergies as of 05/18/2018   No Known Allergies     Medication List    TAKE these medications   methadone 10 MG/5ML solution Commonly known as:  DOLOPHINE Take 205 mg by mouth daily.       Diagnostic Studies: Abdomen 1 View (kub)  Result Date: 05/18/2018 CLINICAL DATA:  Left ureteral stone EXAM: ABDOMEN - 1 VIEW COMPARISON:  04/20/2018 abdominal CT.  Fluoroscopy from yesterday FINDINGS: Large bore left percutaneous nephrostomy tube with nephroureteral  stent. No visible left renal calculi. There is known right nephrolithiasis with probable stone seen over the lower pole, superimposed on stool. IMPRESSION: No evidence of left-sided urinary calculus. Electronically Signed   By: Monte Fantasia M.D.   On: 05/18/2018 06:06   Dg C-arm 1-60 Min-no Report  Result Date: 05/17/2018 Fluoroscopy was utilized by the requesting physician.  No radiographic interpretation.   Ir Ureteral Stent Left New Access W/o Sep Nephrostomy Cath  Result Date: 05/17/2018 INDICATION: Left nephrolithiasis EXAM: LEFT NEPHROURETERAL CATHETER COMPARISON:  None. MEDICATIONS: Ancef 2 g; The antibiotic was administered in an appropriate time frame prior to skin puncture. ANESTHESIA/SEDATION: Fentanyl 250 mcg IV; Versed 5 mg IV Moderate Sedation Time:  20 minutes The patient was continuously monitored during the procedure  by the interventional radiology nurse under my direct supervision. CONTRAST:  5 cc Isovue-300-administered into the collecting system(s) FLUOROSCOPY TIME:  Fluoroscopy Time: 5 minutes 6 seconds (134 mGy). COMPLICATIONS: None immediate. PROCEDURE: Informed written consent was obtained from the patient after a thorough discussion of the procedural risks, benefits and alternatives. All questions were addressed. Maximal Sterile Barrier Technique was utilized including caps, mask, sterile gowns, sterile gloves, sterile drape, hand hygiene and skin antiseptic. A timeout was performed prior to the initiation of the procedure. The back was prepped and draped in a sterile fashion. 1% lidocaine was utilized for local anesthesia. Under fluoroscopic guidance, a 22 gauge needle was inserted into the lower pole calyx containing a calculus. Contrast was injected. The needle was removed over a 018 wire. This was up sized to a Bentson. A 5 French Kumpe catheter was advanced over the Bentson wire to the bladder. Contrast was injected. It was sewn to the skin with 0 silk. FINDINGS: Imaging documents access into the left renal collecting system via lower pole calyx. Subsequent images demonstrate placement of a 5 French nephroureteral catheter from the lower pole calyx to the bladder. IMPRESSION: Successful left 5 French nephroureteral catheter placement in preparation for or lithotomy. Electronically Signed   By: Marybelle Killings M.D.   On: 05/17/2018 12:30    Disposition: Discharge disposition: 01-Home or Self Care       Discharge Instructions    Discontinue IV   Complete by:  As directed    Foley catheter - discontinue   Complete by:  As directed       Follow-up Information    [name], MD Follow up on 05/24/2018.   Specialty:  Urology Why:  10am.   I will also have the office contact you about coming in on Friday 2/7 for removal of the tubes.  Contact information: [address] [place]  [address] [phone]            Signed: [name] 05/18/2018, 8:05 AM

## 2018-05-26 DIAGNOSIS — F17200 Nicotine dependence, unspecified, uncomplicated: Secondary | ICD-10-CM | POA: Diagnosis not present

## 2018-05-26 DIAGNOSIS — M1990 Unspecified osteoarthritis, unspecified site: Secondary | ICD-10-CM | POA: Diagnosis not present

## 2018-05-26 DIAGNOSIS — G8929 Other chronic pain: Secondary | ICD-10-CM | POA: Diagnosis not present

## 2018-05-26 DIAGNOSIS — I2510 Atherosclerotic heart disease of native coronary artery without angina pectoris: Secondary | ICD-10-CM | POA: Diagnosis not present

## 2018-05-26 DIAGNOSIS — Z6830 Body mass index (BMI) 30.0-30.9, adult: Secondary | ICD-10-CM | POA: Diagnosis not present

## 2018-05-26 DIAGNOSIS — Z125 Encounter for screening for malignant neoplasm of prostate: Secondary | ICD-10-CM | POA: Diagnosis not present

## 2018-05-26 DIAGNOSIS — R5383 Other fatigue: Secondary | ICD-10-CM | POA: Diagnosis not present

## 2018-05-26 DIAGNOSIS — I890 Lymphedema, not elsewhere classified: Secondary | ICD-10-CM | POA: Diagnosis not present

## 2018-05-31 DIAGNOSIS — D510 Vitamin B12 deficiency anemia due to intrinsic factor deficiency: Secondary | ICD-10-CM | POA: Diagnosis not present

## 2018-05-31 DIAGNOSIS — I898 Other specified noninfective disorders of lymphatic vessels and lymph nodes: Secondary | ICD-10-CM | POA: Diagnosis not present

## 2018-06-01 LAB — CALCULI, WITH PHOTOGRAPH: PDF - CALCULI: 0

## 2018-06-01 LAB — CALCULI, WITH PHOTOGRAPH (CLINICAL LAB)
Calcium Oxalate Dihydrate: 10 %
Calcium Oxalate Monohydrate: 90 %
Weight Calculi: 227.2 mg

## 2018-06-08 DIAGNOSIS — F17200 Nicotine dependence, unspecified, uncomplicated: Secondary | ICD-10-CM | POA: Diagnosis not present

## 2018-06-08 DIAGNOSIS — J449 Chronic obstructive pulmonary disease, unspecified: Secondary | ICD-10-CM | POA: Diagnosis not present

## 2018-06-08 DIAGNOSIS — Z01810 Encounter for preprocedural cardiovascular examination: Secondary | ICD-10-CM | POA: Diagnosis not present

## 2018-06-08 DIAGNOSIS — Z951 Presence of aortocoronary bypass graft: Secondary | ICD-10-CM | POA: Diagnosis not present

## 2018-06-08 DIAGNOSIS — Z01818 Encounter for other preprocedural examination: Secondary | ICD-10-CM | POA: Diagnosis not present

## 2018-06-08 DIAGNOSIS — R590 Localized enlarged lymph nodes: Secondary | ICD-10-CM | POA: Diagnosis not present

## 2018-06-08 DIAGNOSIS — I898 Other specified noninfective disorders of lymphatic vessels and lymph nodes: Secondary | ICD-10-CM | POA: Diagnosis not present

## 2018-06-08 DIAGNOSIS — I2510 Atherosclerotic heart disease of native coronary artery without angina pectoris: Secondary | ICD-10-CM | POA: Diagnosis not present

## 2018-06-08 DIAGNOSIS — G4730 Sleep apnea, unspecified: Secondary | ICD-10-CM | POA: Diagnosis not present

## 2018-06-09 DIAGNOSIS — D510 Vitamin B12 deficiency anemia due to intrinsic factor deficiency: Secondary | ICD-10-CM | POA: Diagnosis not present

## 2018-06-09 DIAGNOSIS — D519 Vitamin B12 deficiency anemia, unspecified: Secondary | ICD-10-CM | POA: Diagnosis not present

## 2018-06-15 DIAGNOSIS — I2510 Atherosclerotic heart disease of native coronary artery without angina pectoris: Secondary | ICD-10-CM | POA: Insufficient documentation

## 2018-06-15 DIAGNOSIS — I890 Lymphedema, not elsewhere classified: Secondary | ICD-10-CM

## 2018-06-15 DIAGNOSIS — I25119 Atherosclerotic heart disease of native coronary artery with unspecified angina pectoris: Secondary | ICD-10-CM

## 2018-06-16 ENCOUNTER — Encounter: Payer: Self-pay | Admitting: Cardiology

## 2018-06-16 ENCOUNTER — Ambulatory Visit (INDEPENDENT_AMBULATORY_CARE_PROVIDER_SITE_OTHER): Payer: PPO | Admitting: Cardiology

## 2018-06-16 VITALS — BP 118/64 | HR 63 | Wt 232.0 lb

## 2018-06-16 DIAGNOSIS — R7303 Prediabetes: Secondary | ICD-10-CM | POA: Diagnosis not present

## 2018-06-16 DIAGNOSIS — G4733 Obstructive sleep apnea (adult) (pediatric): Secondary | ICD-10-CM | POA: Diagnosis not present

## 2018-06-16 DIAGNOSIS — I25119 Atherosclerotic heart disease of native coronary artery with unspecified angina pectoris: Secondary | ICD-10-CM | POA: Diagnosis not present

## 2018-06-16 DIAGNOSIS — D519 Vitamin B12 deficiency anemia, unspecified: Secondary | ICD-10-CM | POA: Diagnosis not present

## 2018-06-16 DIAGNOSIS — I890 Lymphedema, not elsewhere classified: Secondary | ICD-10-CM | POA: Diagnosis not present

## 2018-06-16 DIAGNOSIS — F17200 Nicotine dependence, unspecified, uncomplicated: Secondary | ICD-10-CM

## 2018-06-16 NOTE — Patient Instructions (Addendum)
Medication Instructions:  Your physician recommends that you continue on your current medications as directed. Please refer to the Current Medication list given to you today.  If you need a refill on your cardiac medications before your next appointment, please call your pharmacy.   Lab work: NONE If you have labs (blood work) drawn today and your tests are completely normal, you will receive your results only by: Marland Kitchen MyChart Message (if you have MyChart) OR . A paper copy in the mail If you have any lab test that is abnormal or we need to change your treatment, we will call you to review the results.  Testing/Procedures: You had an EKG performed today  Your physician has requested that you have an echocardiogram. Echocardiography is a painless test that uses sound waves to create images of your heart. It provides your doctor with information about the size and shape of your heart and how well your heart's chambers and valves are working. This procedure takes approximately one hour. There are no restrictions for this procedure.  Your physician has requested that you have a lower or upper extremity venous duplex. This test is an ultrasound of the veins in the legs or arms. It looks at venous blood flow that carries blood from the heart to the legs or arms. Allow one hour for a Lower Venous exam. Allow thirty minutes for an Upper Venous exam. There are no restrictions or special instructions.      Follow-Up: At [facility], you and your health needs are our priority.  As part of our continuing mission to provide you with exceptional heart care, we have created designated Provider Care Teams.  These Care Teams include your primary Cardiologist (physician) and Advanced Practice Providers (APPs -  Physician Assistants and Nurse Practitioners) who all work together to provide you with the care you need, when you need it. You will need a follow up appointment in 6 weeks.    Any Other Special  Instructions Will Be Listed Below    Echocardiogram An echocardiogram is a procedure that uses painless sound waves (ultrasound) to produce an image of the heart. Images from an echocardiogram can provide important information about:  Signs of coronary artery disease (CAD).  Aneurysm detection. An aneurysm is a weak or damaged part of an artery wall that bulges out from the normal force of blood pumping through the body.  Heart size and shape. Changes in the size or shape of the heart can be associated with certain conditions, including heart failure, aneurysm, and CAD.  Heart muscle function.  Heart valve function.  Signs of a past heart attack.  Fluid buildup around the heart.  Thickening of the heart muscle.  A tumor or infectious growth around the heart valves. Tell a health care provider about:  Any allergies you have.  All medicines you are taking, including vitamins, herbs, eye drops, creams, and over-the-counter medicines.  Any blood disorders you have.  Any surgeries you have had.  Any medical conditions you have.  Whether you are pregnant or may be pregnant. What are the risks? Generally, this is a safe procedure. However, problems may occur, including:  Allergic reaction to dye (contrast) that may be used during the procedure. What happens before the procedure? No specific preparation is needed. You may eat and drink normally. What happens during the procedure?   An IV tube may be inserted into one of your veins.  You may receive contrast through this tube. A contrast is an  injection that improves the quality of the pictures from your heart.  A gel will be applied to your chest.  A wand-like tool (transducer) will be moved over your chest. The gel will help to transmit the sound waves from the transducer.  The sound waves will harmlessly bounce off of your heart to allow the heart images to be captured in real-time motion. The images will be recorded on  a computer. The procedure may vary among health care providers and hospitals. What happens after the procedure?  You may return to your normal, everyday life, including diet, activities, and medicines, unless your health care provider tells you not to do that. Summary  An echocardiogram is a procedure that uses painless sound waves (ultrasound) to produce an image of the heart.  Images from an echocardiogram can provide important information about the size and shape of your heart, heart muscle function, heart valve function, and fluid buildup around your heart.  You do not need to do anything to prepare before this procedure. You may eat and drink normally.  After the echocardiogram is completed, you may return to your normal, everyday life, unless your health care provider tells you not to do that. This information is not intended to replace advice given to you by your health care provider. Make sure you discuss any questions you have with your health care provider. Document Released: 03/27/2000 Document Revised: 05/02/2016 Document Reviewed: 05/02/2016 Elsevier Interactive Patient Education  2019 [place].    Vascular Ultrasound A vascular ultrasound is a painless test that is done to see if you have blood flow problems or clots in your blood vessels. It uses harmless sound waves to take pictures of the arteries and veins in your body. The pictures are taken by passing a device (transducer) over certain areas of your body. Tell a health care provider about:  Any allergies you have.  All medicines you are taking, including vitamins, herbs, eye drops, creams, and over-the-counter medicines.  Any blood disorders you have.  Any surgeries you have had.  Any medical conditions you have.  Whether you are pregnant or may be pregnant. What are the risks? Generally, this is a safe procedure. There are no known risks or complications that arise from having an ultrasound. What happens  before the procedure?  If the ultrasound scan involves your upper abdomen, you may be told not to eat or chew gum the morning of your exam. Follow your health care provider's instructions.  Do not smoke or use nicotine products at least 30 minutes before the exam.  During the test, a gel will be applied to your skin. What happens during the procedure?   A gel will be applied to your skin. It may feel cool.  The transducer will be placed on the area to be examined.  Pictures will be taken. They will be displayed on one or more monitors that look like small television screens. What happens after the procedure?  You can safely drive home immediately after your exam.  You may resume your normal diet and activities.  Keep all follow-up visits as told by your health care provider. This is important.  It is up to you to get your test results. Ask your health care provider, or the department that is doing the test: ? When will my results be ready? ? How will I get my results? ? What are my treatment options? ? What other tests do I need? ? What are my next steps? Summary  A vascular ultrasound is a painless test that is done to see if you have blood flow problems or clots in your blood vessels. It uses harmless sound waves to take pictures of the arteries and veins in your body.  Generally, this is a safe procedure. There are no known risks or complications that arise from having an ultrasound.  A gel will be applied to your skin. It may feel cool. The device that takes the pictures (transducer) will then be placed on the area to be examined.  It is up to you to get your test results. Ask your health care provider or the department that is doing the test when your results will be ready and how you will get your results. This information is not intended to replace advice given to you by your health care provider. Make sure you discuss any questions you have with your health care  provider. Document Released: 04/10/2004 Document Revised: 05/06/2017 Document Reviewed: 05/06/2017 Elsevier Interactive Patient Education  2019 [employer].

## 2018-06-16 NOTE — Progress Notes (Signed)
Cardiology Consultation:    Date:  06/16/2018   ID:  Marvin Chard., DOB 07/05/1960, MRN [id number]  PCP:  [name], MD  Cardiologist:  [name], MD   Referring MD: [name], MD   Chief Complaint  Patient presents with  . Coronary Artery Disease  Doing well  History of Present Illness:    Marvin [name] Marvin a 58 y.o. male who Marvin being seen today for the evaluation of coronary artery disease at the request of [name], [name], MD.  Marvin a gentleman who Marvin to be morbidly obese he did have surgery done after that he lost 260 pounds.  At the time that he was very obese he had hypertension he also got prediabetes as well as dyslipidemia.  He Marvin a chronic smoker he was referred to [place] because of history of coronary artery disease he does not remember any of this he said he never had any heart trouble and he Marvin kind of surprised why he Marvin here.  Overall doing well does walk on the regular basis but have difficulty doing it because of chronic swelling of his lower extremities this Marvin ongoing problem that he been getting for many years.  Denies have any chest pain tightness squeezing pressure burning chest not palpitations no dizziness no passing out. He remember having some test done of his heart years ago which apparently were normal. Denies having any myocardial infarction in the past Denies having any intervention done on his heart in the past.  Past Medical History:  Diagnosis Date  . CHF (congestive heart failure) (HCC)    PT. DENIES AT [facility]  . Depression   . Heart murmur    hx of small murmur   . Hepatitis    Hepatitis C not treated for Hx of  . History of kidney stones   . Morbid obesity ([place])   . Pneumonia   . Sleep apnea    hx of had gastic bypass  . Venous insufficiency of leg   . Venous stasis dermatitis     Past Surgical History:  Procedure Laterality Date  . CARPAL TUNNEL RELEASE     bilateral   . CORONARY ARTERY BYPASS GRAFT    .  CYSTOSCOPY W/ URETERAL STENT PLACEMENT Bilateral 04/21/2016   Procedure: CYSTOSCOPY WITH RETROGRADE PYELOGRAM/URETERAL STENT PLACEMENT;  Surgeon: [name], MD;  Location: [facility];  Service: Urology;  Laterality: Bilateral;  . CYSTOSCOPY/URETEROSCOPY/HOLMIUM LASER/STENT PLACEMENT Bilateral 04/01/2016   Procedure: CYSTOSCOPY WITH RETROGRADE AND  STENT PLACEMENT;  Surgeon: [name], MD;  Location: [facility];  Service: Urology;  Laterality: Bilateral;  . CYSTOSCOPY/URETEROSCOPY/HOLMIUM LASER/STENT PLACEMENT Bilateral 04/21/2016   Procedure: CYSTOSCOPY/URETEROSCOPY/HOLMIUM LASER/STENT REPLACEMENT;  Surgeon: [name], MD;  Location: [facility];  Service: Urology;  Laterality: Bilateral;  . EYE SURGERY     bil cataracts  . gastric bypass surgery   10/2015  . GASTROPLASTY DUODENAL SWITCH    . HERNIA REPAIR    . HOLMIUM LASER APPLICATION Bilateral 11/15/1322   Procedure: HOLMIUM LASER APPLICATION;  Surgeon: [name], MD;  Location: [facility];  Service: Urology;  Laterality: Bilateral;  . IR URETERAL STENT LEFT NEW ACCESS W/O SEP NEPHROSTOMY CATH  05/17/2018  . NEPHROLITHOTOMY Left 05/17/2018   Procedure: NEPHROLITHOTOMY PERCUTANEOUS;  Surgeon: [name], MD;  Location: [facility];  Service: Urology;  Laterality: Left;    Current Medications: Current Meds  Medication Sig  . ATORVASTATIN CALCIUM PO  Take by mouth.  . methadone (DOLOPHINE) 10 MG/5ML solution Take 205 mg by mouth daily.      Allergies:   Patient has no known allergies.   Social History   Socioeconomic History  . Marital status: Legally Separated    Spouse name: Not on file  . Number of children: Not on file  . Years of education: Not on file  . Highest education level: Not on file  Occupational History  . Not on file  Social Needs  . Financial resource strain: Not on file  . Food insecurity:    Worry: Not on file    Inability: Not on file  . Transportation needs:    Medical: Not on file    Non-medical:  Not on file  Tobacco Use  . Smoking status: Current Some Day Smoker    Packs/day: 0.50    Years: 35.00    Pack years: 17.50    Types: Cigarettes    Last attempt to quit: 05/17/2015    Years since quitting: 3.0  . Smokeless tobacco: Never Used  . Tobacco comment: currently only smoking 0.5 ppd  Substance and Sexual Activity  . Alcohol use: No  . 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 Sexual activity: Not Currently  Lifestyle  . Physical activity:    Days per week: Not on file    Minutes per session: Not on file  . Stress: Not on file  Relationships  . Social connections:    Talks on phone: Not on file    Gets together: Not on file    Attends religious service: Not on file    Active member of club or organization: Not on file    Attends meetings of clubs or organizations: Not on file    Relationship status: Not on file  Other Topics Concern  . Not on file  Social History Narrative   Divorced man.  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  Lives in [place], [place].  4 children all in good health.  3 sisters in good health.  Father in poor health near by     Family History: The patient's family history includes Colon cancer in his father; Diabetes in his father; Heart disease in his father; Hypertension in his father; Leukemia in his mother. ROS:   Please see the history of present illness.    All 14 point review of systems negative except as described per history of present illness.  EKGs/Labs/Other Studies Reviewed:    The following studies were reviewed today:   EKG:  EKG Marvin  ordered today.  The ekg ordered today demonstrates sinus bradycardia rate of 58 normal P interval normal QS complex thresholds no ST-T segment changes  Recent Labs: 05/10/2018: ALT 11 05/17/2018: BUN 14; Creatinine, Ser 0.89; Platelets 265; Potassium 3.5; Sodium 136 05/18/2018:  Hemoglobin 10.5  Recent Lipid Panel No results found for: CHOL, TRIG, HDL, CHOLHDL, VLDL, LDLCALC, LDLDIRECT  Physical Exam:    VS:  BP 118/64   Pulse 63   Wt 232 lb (105.2 kg)   SpO2 97%   BMI 32.36 kg/m     Wt Readings from Last 3 Encounters:  06/16/18 232 lb (105.2 kg)  05/17/18 222 lb 0.1 oz (100.7 kg)  05/10/18 222 lb (100.7 kg)     GEN:  Well nourished, well developed in no acute distress HEENT: Normal NECK:  No JVD; No carotid bruits LYMPHATICS: No lymphadenopathy CARDIAC: RRR, no murmurs, no rubs, no gallops RESPIRATORY:  Clear to auscultation without rales, wheezing or rhonchi  ABDOMEN: Soft, non-tender, non-distended MUSCULOSKELETAL: 2+ swelling on the right 1+ swelling on the left SKIN: Warm and dry NEUROLOGIC:  Alert and oriented x 3 PSYCHIATRIC:  Normal affect   ASSESSMENT:    1. Coronary artery disease involving native heart with angina pectoris, unspecified vessel or lesion type ([place])   2. Obstructive sleep apnea syndrome   3. Smoking   4. Prediabetes   5. Morbid obesity ([place])   6. Lymphedema of both lower extremities    PLAN:    In order of problems listed above:  1. Coronary artery disease.  He denies having any history of this.  I will try to investigate and get some more information about it but he denies having any intervention done on his heart denies having any myocardial infarction.  I will schedule him to have echocardiogram done when we can look at his left ventricular ejection fraction as well as function of the valve. 2. Obstructive sleep apnea and that being managed by internal medicine team.  Significantly better after significant weight loss. 3. Smoking ongoing half pack per day and Marvin working on quitting I told him he must quit previously he quit for about 6 months. 4. Prediabetes improved after significant weight loss after gastric surgery. 5. Morbid obesity total 260 pounds lost. 6. Lymphedema of both lower extremities.  I will schedule  him to have again carotic ultrasound I will make sure he does not have any DVT.  I see study from August of last year.  His right leg Marvin more swollen than the left.  That Marvin concerning he does have chronic venous insufficiency as well as lymphedema.  Does not have any open wound.  Pulses are impossible to palpate because of edema.   Medication Adjustments/Labs and Tests Ordered: Current medicines are reviewed at length with the patient today.  Concerns regarding medicines are outlined above.  No orders of the defined types were placed in this encounter.  No orders of the defined types were placed in this encounter.   Signed, Park Liter, MD, [facility]. 06/16/2018 10:15 AM    [place]

## 2018-06-30 DIAGNOSIS — D519 Vitamin B12 deficiency anemia, unspecified: Secondary | ICD-10-CM | POA: Diagnosis not present

## 2018-07-08 DIAGNOSIS — E538 Deficiency of other specified B group vitamins: Secondary | ICD-10-CM | POA: Diagnosis not present

## 2018-07-11 ENCOUNTER — Telehealth: Payer: Self-pay | Admitting: Cardiology

## 2018-07-11 DIAGNOSIS — L97812 Non-pressure chronic ulcer of other part of right lower leg with fat layer exposed: Secondary | ICD-10-CM | POA: Diagnosis not present

## 2018-07-11 DIAGNOSIS — I87331 Chronic venous hypertension (idiopathic) with ulcer and inflammation of right lower extremity: Secondary | ICD-10-CM | POA: Diagnosis not present

## 2018-07-11 DIAGNOSIS — F17210 Nicotine dependence, cigarettes, uncomplicated: Secondary | ICD-10-CM | POA: Diagnosis not present

## 2018-07-11 DIAGNOSIS — L97919 Non-pressure chronic ulcer of unspecified part of right lower leg with unspecified severity: Secondary | ICD-10-CM | POA: Diagnosis not present

## 2018-07-11 DIAGNOSIS — I87311 Chronic venous hypertension (idiopathic) with ulcer of right lower extremity: Secondary | ICD-10-CM | POA: Diagnosis not present

## 2018-07-11 DIAGNOSIS — G629 Polyneuropathy, unspecified: Secondary | ICD-10-CM | POA: Diagnosis not present

## 2018-07-11 DIAGNOSIS — I739 Peripheral vascular disease, unspecified: Secondary | ICD-10-CM | POA: Diagnosis not present

## 2018-07-11 DIAGNOSIS — L03115 Cellulitis of right lower limb: Secondary | ICD-10-CM | POA: Diagnosis not present

## 2018-07-11 NOTE — Telephone Encounter (Signed)
Echo and Venous 07/19/2018

## 2018-07-18 DIAGNOSIS — L03115 Cellulitis of right lower limb: Secondary | ICD-10-CM | POA: Diagnosis not present

## 2018-07-18 DIAGNOSIS — E119 Type 2 diabetes mellitus without complications: Secondary | ICD-10-CM | POA: Diagnosis not present

## 2018-07-18 DIAGNOSIS — J301 Allergic rhinitis due to pollen: Secondary | ICD-10-CM | POA: Diagnosis not present

## 2018-07-18 DIAGNOSIS — I87311 Chronic venous hypertension (idiopathic) with ulcer of right lower extremity: Secondary | ICD-10-CM | POA: Diagnosis not present

## 2018-07-18 DIAGNOSIS — I739 Peripheral vascular disease, unspecified: Secondary | ICD-10-CM | POA: Diagnosis not present

## 2018-07-18 DIAGNOSIS — J3081 Allergic rhinitis due to animal (cat) (dog) hair and dander: Secondary | ICD-10-CM | POA: Diagnosis not present

## 2018-07-18 DIAGNOSIS — L97812 Non-pressure chronic ulcer of other part of right lower leg with fat layer exposed: Secondary | ICD-10-CM | POA: Diagnosis not present

## 2018-07-18 DIAGNOSIS — J3089 Other allergic rhinitis: Secondary | ICD-10-CM | POA: Diagnosis not present

## 2018-07-19 ENCOUNTER — Other Ambulatory Visit: Payer: PPO

## 2018-07-19 DIAGNOSIS — I739 Peripheral vascular disease, unspecified: Secondary | ICD-10-CM | POA: Diagnosis not present

## 2018-07-19 DIAGNOSIS — D649 Anemia, unspecified: Secondary | ICD-10-CM | POA: Diagnosis not present

## 2018-07-19 DIAGNOSIS — L03115 Cellulitis of right lower limb: Secondary | ICD-10-CM | POA: Diagnosis not present

## 2018-07-19 DIAGNOSIS — B9562 Methicillin resistant Staphylococcus aureus infection as the cause of diseases classified elsewhere: Secondary | ICD-10-CM | POA: Diagnosis not present

## 2018-07-19 DIAGNOSIS — G629 Polyneuropathy, unspecified: Secondary | ICD-10-CM | POA: Diagnosis not present

## 2018-07-19 DIAGNOSIS — B9689 Other specified bacterial agents as the cause of diseases classified elsewhere: Secondary | ICD-10-CM | POA: Diagnosis not present

## 2018-07-19 DIAGNOSIS — L97811 Non-pressure chronic ulcer of other part of right lower leg limited to breakdown of skin: Secondary | ICD-10-CM | POA: Diagnosis not present

## 2018-07-19 DIAGNOSIS — I87311 Chronic venous hypertension (idiopathic) with ulcer of right lower extremity: Secondary | ICD-10-CM | POA: Diagnosis not present

## 2018-07-19 DIAGNOSIS — Z452 Encounter for adjustment and management of vascular access device: Secondary | ICD-10-CM | POA: Diagnosis not present

## 2018-07-22 DIAGNOSIS — L03115 Cellulitis of right lower limb: Secondary | ICD-10-CM | POA: Diagnosis not present

## 2018-07-25 DIAGNOSIS — E782 Mixed hyperlipidemia: Secondary | ICD-10-CM | POA: Diagnosis not present

## 2018-07-25 DIAGNOSIS — M545 Low back pain: Secondary | ICD-10-CM | POA: Diagnosis not present

## 2018-07-25 DIAGNOSIS — E538 Deficiency of other specified B group vitamins: Secondary | ICD-10-CM | POA: Diagnosis not present

## 2018-07-25 DIAGNOSIS — L03115 Cellulitis of right lower limb: Secondary | ICD-10-CM | POA: Diagnosis not present

## 2018-07-25 DIAGNOSIS — Z950 Presence of cardiac pacemaker: Secondary | ICD-10-CM | POA: Diagnosis not present

## 2018-07-25 DIAGNOSIS — K2100 Gastro-esophageal reflux disease with esophagitis, without bleeding: Secondary | ICD-10-CM | POA: Diagnosis not present

## 2018-07-25 DIAGNOSIS — I2510 Atherosclerotic heart disease of native coronary artery without angina pectoris: Secondary | ICD-10-CM | POA: Diagnosis not present

## 2018-07-26 DIAGNOSIS — M9902 Segmental and somatic dysfunction of thoracic region: Secondary | ICD-10-CM | POA: Diagnosis not present

## 2018-07-26 DIAGNOSIS — I872 Venous insufficiency (chronic) (peripheral): Secondary | ICD-10-CM | POA: Diagnosis not present

## 2018-07-26 DIAGNOSIS — I87311 Chronic venous hypertension (idiopathic) with ulcer of right lower extremity: Secondary | ICD-10-CM | POA: Diagnosis not present

## 2018-07-26 DIAGNOSIS — M542 Cervicalgia: Secondary | ICD-10-CM | POA: Diagnosis not present

## 2018-07-26 DIAGNOSIS — M9901 Segmental and somatic dysfunction of cervical region: Secondary | ICD-10-CM | POA: Diagnosis not present

## 2018-07-26 DIAGNOSIS — M546 Pain in thoracic spine: Secondary | ICD-10-CM | POA: Diagnosis not present

## 2018-07-26 DIAGNOSIS — L97812 Non-pressure chronic ulcer of other part of right lower leg with fat layer exposed: Secondary | ICD-10-CM | POA: Diagnosis not present

## 2018-07-26 DIAGNOSIS — I739 Peripheral vascular disease, unspecified: Secondary | ICD-10-CM | POA: Diagnosis not present

## 2018-07-27 DIAGNOSIS — L03115 Cellulitis of right lower limb: Secondary | ICD-10-CM | POA: Diagnosis not present

## 2018-07-28 ENCOUNTER — Telehealth: Payer: PPO | Admitting: Cardiology

## 2018-07-28 DIAGNOSIS — L03115 Cellulitis of right lower limb: Secondary | ICD-10-CM | POA: Diagnosis not present

## 2018-07-28 DIAGNOSIS — B999 Unspecified infectious disease: Secondary | ICD-10-CM | POA: Diagnosis not present

## 2018-07-28 DIAGNOSIS — B9562 Methicillin resistant Staphylococcus aureus infection as the cause of diseases classified elsewhere: Secondary | ICD-10-CM | POA: Diagnosis not present

## 2018-07-28 DIAGNOSIS — Z79899 Other long term (current) drug therapy: Secondary | ICD-10-CM | POA: Diagnosis not present

## 2018-08-01 DIAGNOSIS — L03115 Cellulitis of right lower limb: Secondary | ICD-10-CM | POA: Diagnosis not present

## 2018-08-01 DIAGNOSIS — L97812 Non-pressure chronic ulcer of other part of right lower leg with fat layer exposed: Secondary | ICD-10-CM | POA: Diagnosis not present

## 2018-08-01 DIAGNOSIS — I87311 Chronic venous hypertension (idiopathic) with ulcer of right lower extremity: Secondary | ICD-10-CM | POA: Diagnosis not present

## 2018-08-08 DIAGNOSIS — I87311 Chronic venous hypertension (idiopathic) with ulcer of right lower extremity: Secondary | ICD-10-CM | POA: Diagnosis not present

## 2018-08-08 DIAGNOSIS — L97812 Non-pressure chronic ulcer of other part of right lower leg with fat layer exposed: Secondary | ICD-10-CM | POA: Diagnosis not present

## 2018-08-08 DIAGNOSIS — L03115 Cellulitis of right lower limb: Secondary | ICD-10-CM | POA: Diagnosis not present

## 2018-08-15 DIAGNOSIS — I87311 Chronic venous hypertension (idiopathic) with ulcer of right lower extremity: Secondary | ICD-10-CM | POA: Diagnosis not present

## 2018-08-15 DIAGNOSIS — L97812 Non-pressure chronic ulcer of other part of right lower leg with fat layer exposed: Secondary | ICD-10-CM | POA: Diagnosis not present

## 2018-08-15 DIAGNOSIS — L03115 Cellulitis of right lower limb: Secondary | ICD-10-CM | POA: Diagnosis not present

## 2018-08-15 DIAGNOSIS — L97512 Non-pressure chronic ulcer of other part of right foot with fat layer exposed: Secondary | ICD-10-CM | POA: Diagnosis not present

## 2018-08-18 DIAGNOSIS — I87311 Chronic venous hypertension (idiopathic) with ulcer of right lower extremity: Secondary | ICD-10-CM | POA: Diagnosis not present

## 2018-08-18 DIAGNOSIS — L97519 Non-pressure chronic ulcer of other part of right foot with unspecified severity: Secondary | ICD-10-CM | POA: Diagnosis not present

## 2018-08-18 DIAGNOSIS — L97819 Non-pressure chronic ulcer of other part of right lower leg with unspecified severity: Secondary | ICD-10-CM | POA: Diagnosis not present

## 2018-08-22 DIAGNOSIS — L97812 Non-pressure chronic ulcer of other part of right lower leg with fat layer exposed: Secondary | ICD-10-CM | POA: Diagnosis not present

## 2018-08-22 DIAGNOSIS — I87311 Chronic venous hypertension (idiopathic) with ulcer of right lower extremity: Secondary | ICD-10-CM | POA: Diagnosis not present

## 2018-08-22 DIAGNOSIS — L97512 Non-pressure chronic ulcer of other part of right foot with fat layer exposed: Secondary | ICD-10-CM | POA: Diagnosis not present

## 2018-08-25 DIAGNOSIS — L97819 Non-pressure chronic ulcer of other part of right lower leg with unspecified severity: Secondary | ICD-10-CM | POA: Diagnosis not present

## 2018-08-25 DIAGNOSIS — L97519 Non-pressure chronic ulcer of other part of right foot with unspecified severity: Secondary | ICD-10-CM | POA: Diagnosis not present

## 2018-08-25 DIAGNOSIS — I87311 Chronic venous hypertension (idiopathic) with ulcer of right lower extremity: Secondary | ICD-10-CM | POA: Diagnosis not present

## 2018-08-29 DIAGNOSIS — L97519 Non-pressure chronic ulcer of other part of right foot with unspecified severity: Secondary | ICD-10-CM | POA: Diagnosis not present

## 2018-08-29 DIAGNOSIS — L97819 Non-pressure chronic ulcer of other part of right lower leg with unspecified severity: Secondary | ICD-10-CM | POA: Diagnosis not present

## 2018-08-29 DIAGNOSIS — L97512 Non-pressure chronic ulcer of other part of right foot with fat layer exposed: Secondary | ICD-10-CM | POA: Diagnosis not present

## 2018-08-29 DIAGNOSIS — L97812 Non-pressure chronic ulcer of other part of right lower leg with fat layer exposed: Secondary | ICD-10-CM | POA: Diagnosis not present

## 2018-08-29 DIAGNOSIS — I87311 Chronic venous hypertension (idiopathic) with ulcer of right lower extremity: Secondary | ICD-10-CM | POA: Diagnosis not present

## 2018-09-01 DIAGNOSIS — L97519 Non-pressure chronic ulcer of other part of right foot with unspecified severity: Secondary | ICD-10-CM | POA: Diagnosis not present

## 2018-09-01 DIAGNOSIS — L97819 Non-pressure chronic ulcer of other part of right lower leg with unspecified severity: Secondary | ICD-10-CM | POA: Diagnosis not present

## 2018-09-01 DIAGNOSIS — I87311 Chronic venous hypertension (idiopathic) with ulcer of right lower extremity: Secondary | ICD-10-CM | POA: Diagnosis not present

## 2018-09-12 DIAGNOSIS — R69 Illness, unspecified: Secondary | ICD-10-CM | POA: Diagnosis not present

## 2018-10-04 DIAGNOSIS — I739 Peripheral vascular disease, unspecified: Secondary | ICD-10-CM | POA: Diagnosis not present

## 2018-10-04 DIAGNOSIS — G629 Polyneuropathy, unspecified: Secondary | ICD-10-CM | POA: Diagnosis not present

## 2018-10-04 DIAGNOSIS — F17210 Nicotine dependence, cigarettes, uncomplicated: Secondary | ICD-10-CM | POA: Diagnosis not present

## 2018-10-04 DIAGNOSIS — I87312 Chronic venous hypertension (idiopathic) with ulcer of left lower extremity: Secondary | ICD-10-CM | POA: Diagnosis not present

## 2018-10-04 DIAGNOSIS — L97822 Non-pressure chronic ulcer of other part of left lower leg with fat layer exposed: Secondary | ICD-10-CM | POA: Diagnosis not present

## 2018-10-06 DIAGNOSIS — I87312 Chronic venous hypertension (idiopathic) with ulcer of left lower extremity: Secondary | ICD-10-CM | POA: Diagnosis not present

## 2018-10-06 DIAGNOSIS — L97822 Non-pressure chronic ulcer of other part of left lower leg with fat layer exposed: Secondary | ICD-10-CM | POA: Diagnosis not present

## 2018-10-10 DIAGNOSIS — S0180XA Unspecified open wound of other part of head, initial encounter: Secondary | ICD-10-CM | POA: Diagnosis not present

## 2018-10-10 DIAGNOSIS — S50811A Abrasion of right forearm, initial encounter: Secondary | ICD-10-CM | POA: Diagnosis not present

## 2018-10-10 DIAGNOSIS — S0181XA Laceration without foreign body of other part of head, initial encounter: Secondary | ICD-10-CM | POA: Diagnosis not present

## 2018-10-10 DIAGNOSIS — L97822 Non-pressure chronic ulcer of other part of left lower leg with fat layer exposed: Secondary | ICD-10-CM | POA: Diagnosis not present

## 2018-10-10 DIAGNOSIS — I87312 Chronic venous hypertension (idiopathic) with ulcer of left lower extremity: Secondary | ICD-10-CM | POA: Diagnosis not present

## 2018-10-12 DIAGNOSIS — I890 Lymphedema, not elsewhere classified: Secondary | ICD-10-CM | POA: Diagnosis not present

## 2018-10-12 DIAGNOSIS — Z6829 Body mass index (BMI) 29.0-29.9, adult: Secondary | ICD-10-CM | POA: Diagnosis not present

## 2018-10-12 DIAGNOSIS — F17200 Nicotine dependence, unspecified, uncomplicated: Secondary | ICD-10-CM | POA: Diagnosis not present

## 2018-10-12 DIAGNOSIS — M1990 Unspecified osteoarthritis, unspecified site: Secondary | ICD-10-CM | POA: Diagnosis not present

## 2018-10-12 DIAGNOSIS — Z0000 Encounter for general adult medical examination without abnormal findings: Secondary | ICD-10-CM | POA: Diagnosis not present

## 2018-10-13 DIAGNOSIS — I87312 Chronic venous hypertension (idiopathic) with ulcer of left lower extremity: Secondary | ICD-10-CM | POA: Diagnosis not present

## 2018-10-13 DIAGNOSIS — S50811A Abrasion of right forearm, initial encounter: Secondary | ICD-10-CM | POA: Diagnosis not present

## 2018-10-13 DIAGNOSIS — L97822 Non-pressure chronic ulcer of other part of left lower leg with fat layer exposed: Secondary | ICD-10-CM | POA: Diagnosis not present

## 2018-10-13 DIAGNOSIS — S0181XA Laceration without foreign body of other part of head, initial encounter: Secondary | ICD-10-CM | POA: Diagnosis not present

## 2018-10-17 DIAGNOSIS — I87311 Chronic venous hypertension (idiopathic) with ulcer of right lower extremity: Secondary | ICD-10-CM | POA: Diagnosis not present

## 2018-10-17 DIAGNOSIS — L97812 Non-pressure chronic ulcer of other part of right lower leg with fat layer exposed: Secondary | ICD-10-CM | POA: Diagnosis not present

## 2018-10-20 DIAGNOSIS — I87311 Chronic venous hypertension (idiopathic) with ulcer of right lower extremity: Secondary | ICD-10-CM | POA: Diagnosis not present

## 2018-10-20 DIAGNOSIS — L97819 Non-pressure chronic ulcer of other part of right lower leg with unspecified severity: Secondary | ICD-10-CM | POA: Diagnosis not present

## 2018-10-24 ENCOUNTER — Other Ambulatory Visit (HOSPITAL_COMMUNITY): Payer: Self-pay | Admitting: Family Medicine

## 2018-10-24 ENCOUNTER — Telehealth (HOSPITAL_COMMUNITY): Payer: Self-pay

## 2018-10-24 DIAGNOSIS — R69 Illness, unspecified: Secondary | ICD-10-CM | POA: Diagnosis not present

## 2018-10-24 DIAGNOSIS — R600 Localized edema: Secondary | ICD-10-CM

## 2018-10-24 DIAGNOSIS — L97819 Non-pressure chronic ulcer of other part of right lower leg with unspecified severity: Secondary | ICD-10-CM | POA: Diagnosis not present

## 2018-10-24 DIAGNOSIS — L97812 Non-pressure chronic ulcer of other part of right lower leg with fat layer exposed: Secondary | ICD-10-CM | POA: Diagnosis not present

## 2018-10-24 DIAGNOSIS — I87311 Chronic venous hypertension (idiopathic) with ulcer of right lower extremity: Secondary | ICD-10-CM | POA: Diagnosis not present

## 2018-10-24 NOTE — Telephone Encounter (Signed)
Left voice mail re: covid protocol.

## 2018-10-25 ENCOUNTER — Ambulatory Visit (HOSPITAL_COMMUNITY)
Admission: RE | Admit: 2018-10-25 | Discharge: 2018-10-25 | Disposition: A | Discharge status: Home / Self Care | Payer: PPO | Source: Ambulatory Visit | Attending: Family | Admitting: Family

## 2018-10-25 ENCOUNTER — Other Ambulatory Visit: Payer: Self-pay

## 2018-10-25 DIAGNOSIS — R600 Localized edema: Secondary | ICD-10-CM | POA: Diagnosis not present

## 2018-10-27 DIAGNOSIS — L97812 Non-pressure chronic ulcer of other part of right lower leg with fat layer exposed: Secondary | ICD-10-CM | POA: Diagnosis not present

## 2018-10-27 DIAGNOSIS — I87311 Chronic venous hypertension (idiopathic) with ulcer of right lower extremity: Secondary | ICD-10-CM | POA: Diagnosis not present

## 2018-10-31 DIAGNOSIS — L97812 Non-pressure chronic ulcer of other part of right lower leg with fat layer exposed: Secondary | ICD-10-CM | POA: Diagnosis not present

## 2018-10-31 DIAGNOSIS — I87311 Chronic venous hypertension (idiopathic) with ulcer of right lower extremity: Secondary | ICD-10-CM | POA: Diagnosis not present

## 2018-11-03 DIAGNOSIS — I87311 Chronic venous hypertension (idiopathic) with ulcer of right lower extremity: Secondary | ICD-10-CM | POA: Diagnosis not present

## 2018-11-03 DIAGNOSIS — L97812 Non-pressure chronic ulcer of other part of right lower leg with fat layer exposed: Secondary | ICD-10-CM | POA: Diagnosis not present

## 2018-11-04 DIAGNOSIS — I890 Lymphedema, not elsewhere classified: Secondary | ICD-10-CM | POA: Diagnosis not present

## 2018-11-07 DIAGNOSIS — R69 Illness, unspecified: Secondary | ICD-10-CM | POA: Diagnosis not present

## 2018-11-07 DIAGNOSIS — I87311 Chronic venous hypertension (idiopathic) with ulcer of right lower extremity: Secondary | ICD-10-CM | POA: Diagnosis not present

## 2018-11-07 DIAGNOSIS — L97812 Non-pressure chronic ulcer of other part of right lower leg with fat layer exposed: Secondary | ICD-10-CM | POA: Diagnosis not present

## 2018-11-10 DIAGNOSIS — I87311 Chronic venous hypertension (idiopathic) with ulcer of right lower extremity: Secondary | ICD-10-CM | POA: Diagnosis not present

## 2018-11-10 DIAGNOSIS — L97819 Non-pressure chronic ulcer of other part of right lower leg with unspecified severity: Secondary | ICD-10-CM | POA: Diagnosis not present

## 2018-11-10 DIAGNOSIS — I872 Venous insufficiency (chronic) (peripheral): Secondary | ICD-10-CM | POA: Diagnosis not present

## 2018-11-14 DIAGNOSIS — Z1331 Encounter for screening for depression: Secondary | ICD-10-CM | POA: Diagnosis not present

## 2018-11-14 DIAGNOSIS — Z6830 Body mass index (BMI) 30.0-30.9, adult: Secondary | ICD-10-CM | POA: Diagnosis not present

## 2018-11-14 DIAGNOSIS — D649 Anemia, unspecified: Secondary | ICD-10-CM | POA: Diagnosis not present

## 2018-11-14 DIAGNOSIS — I87311 Chronic venous hypertension (idiopathic) with ulcer of right lower extremity: Secondary | ICD-10-CM | POA: Diagnosis not present

## 2018-11-14 DIAGNOSIS — L97819 Non-pressure chronic ulcer of other part of right lower leg with unspecified severity: Secondary | ICD-10-CM | POA: Diagnosis not present

## 2018-11-14 DIAGNOSIS — I872 Venous insufficiency (chronic) (peripheral): Secondary | ICD-10-CM | POA: Diagnosis not present

## 2018-11-14 DIAGNOSIS — L97812 Non-pressure chronic ulcer of other part of right lower leg with fat layer exposed: Secondary | ICD-10-CM | POA: Diagnosis not present

## 2018-11-14 DIAGNOSIS — F17200 Nicotine dependence, unspecified, uncomplicated: Secondary | ICD-10-CM | POA: Diagnosis not present

## 2018-11-14 DIAGNOSIS — I890 Lymphedema, not elsewhere classified: Secondary | ICD-10-CM | POA: Diagnosis not present

## 2018-11-17 DIAGNOSIS — I87311 Chronic venous hypertension (idiopathic) with ulcer of right lower extremity: Secondary | ICD-10-CM | POA: Diagnosis not present

## 2018-11-17 DIAGNOSIS — L97819 Non-pressure chronic ulcer of other part of right lower leg with unspecified severity: Secondary | ICD-10-CM | POA: Diagnosis not present

## 2018-11-21 DIAGNOSIS — F1120 Opioid dependence, uncomplicated: Secondary | ICD-10-CM | POA: Diagnosis not present

## 2018-11-24 DIAGNOSIS — I87311 Chronic venous hypertension (idiopathic) with ulcer of right lower extremity: Secondary | ICD-10-CM | POA: Diagnosis not present

## 2018-11-24 DIAGNOSIS — E11622 Type 2 diabetes mellitus with other skin ulcer: Secondary | ICD-10-CM | POA: Diagnosis not present

## 2018-11-24 DIAGNOSIS — L97812 Non-pressure chronic ulcer of other part of right lower leg with fat layer exposed: Secondary | ICD-10-CM | POA: Diagnosis not present

## 2018-12-01 DIAGNOSIS — L97812 Non-pressure chronic ulcer of other part of right lower leg with fat layer exposed: Secondary | ICD-10-CM | POA: Diagnosis not present

## 2018-12-01 DIAGNOSIS — I87311 Chronic venous hypertension (idiopathic) with ulcer of right lower extremity: Secondary | ICD-10-CM | POA: Diagnosis not present

## 2018-12-05 DIAGNOSIS — F1120 Opioid dependence, uncomplicated: Secondary | ICD-10-CM | POA: Diagnosis not present

## 2018-12-15 DIAGNOSIS — I87312 Chronic venous hypertension (idiopathic) with ulcer of left lower extremity: Secondary | ICD-10-CM | POA: Diagnosis not present

## 2018-12-15 DIAGNOSIS — L97812 Non-pressure chronic ulcer of other part of right lower leg with fat layer exposed: Secondary | ICD-10-CM | POA: Diagnosis not present

## 2018-12-15 DIAGNOSIS — I87311 Chronic venous hypertension (idiopathic) with ulcer of right lower extremity: Secondary | ICD-10-CM | POA: Diagnosis not present

## 2018-12-15 DIAGNOSIS — L97822 Non-pressure chronic ulcer of other part of left lower leg with fat layer exposed: Secondary | ICD-10-CM | POA: Diagnosis not present

## 2018-12-15 DIAGNOSIS — S50811A Abrasion of right forearm, initial encounter: Secondary | ICD-10-CM | POA: Diagnosis not present

## 2018-12-20 DIAGNOSIS — F1120 Opioid dependence, uncomplicated: Secondary | ICD-10-CM | POA: Diagnosis not present

## 2018-12-26 DIAGNOSIS — I87311 Chronic venous hypertension (idiopathic) with ulcer of right lower extremity: Secondary | ICD-10-CM | POA: Diagnosis not present

## 2018-12-26 DIAGNOSIS — Z872 Personal history of diseases of the skin and subcutaneous tissue: Secondary | ICD-10-CM | POA: Diagnosis not present

## 2018-12-26 DIAGNOSIS — L97819 Non-pressure chronic ulcer of other part of right lower leg with unspecified severity: Secondary | ICD-10-CM | POA: Diagnosis not present

## 2018-12-26 DIAGNOSIS — Z09 Encounter for follow-up examination after completed treatment for conditions other than malignant neoplasm: Secondary | ICD-10-CM | POA: Diagnosis not present

## 2018-12-29 DIAGNOSIS — M546 Pain in thoracic spine: Secondary | ICD-10-CM | POA: Diagnosis not present

## 2018-12-29 DIAGNOSIS — F17200 Nicotine dependence, unspecified, uncomplicated: Secondary | ICD-10-CM | POA: Diagnosis not present

## 2018-12-29 DIAGNOSIS — D519 Vitamin B12 deficiency anemia, unspecified: Secondary | ICD-10-CM | POA: Diagnosis not present

## 2018-12-29 DIAGNOSIS — M1990 Unspecified osteoarthritis, unspecified site: Secondary | ICD-10-CM | POA: Diagnosis not present

## 2018-12-29 DIAGNOSIS — Z6834 Body mass index (BMI) 34.0-34.9, adult: Secondary | ICD-10-CM | POA: Diagnosis not present

## 2018-12-29 DIAGNOSIS — Z2821 Immunization not carried out because of patient refusal: Secondary | ICD-10-CM | POA: Diagnosis not present

## 2018-12-29 DIAGNOSIS — M545 Low back pain: Secondary | ICD-10-CM | POA: Diagnosis not present

## 2018-12-29 DIAGNOSIS — I890 Lymphedema, not elsewhere classified: Secondary | ICD-10-CM | POA: Diagnosis not present

## 2018-12-29 DIAGNOSIS — E785 Hyperlipidemia, unspecified: Secondary | ICD-10-CM | POA: Diagnosis not present

## 2019-01-03 DIAGNOSIS — F1120 Opioid dependence, uncomplicated: Secondary | ICD-10-CM | POA: Diagnosis not present

## 2019-01-16 DIAGNOSIS — M25571 Pain in right ankle and joints of right foot: Secondary | ICD-10-CM | POA: Diagnosis not present

## 2019-01-16 DIAGNOSIS — M79671 Pain in right foot: Secondary | ICD-10-CM | POA: Diagnosis not present

## 2019-01-16 DIAGNOSIS — S86011A Strain of right Achilles tendon, initial encounter: Secondary | ICD-10-CM | POA: Diagnosis not present

## 2019-01-16 DIAGNOSIS — F1120 Opioid dependence, uncomplicated: Secondary | ICD-10-CM | POA: Diagnosis not present

## 2019-01-16 DIAGNOSIS — F17200 Nicotine dependence, unspecified, uncomplicated: Secondary | ICD-10-CM | POA: Diagnosis not present

## 2019-01-18 ENCOUNTER — Other Ambulatory Visit: Payer: Self-pay

## 2019-01-18 ENCOUNTER — Ambulatory Visit (INDEPENDENT_AMBULATORY_CARE_PROVIDER_SITE_OTHER): Payer: PPO | Admitting: Vascular Surgery

## 2019-01-18 ENCOUNTER — Encounter: Payer: Self-pay | Admitting: Vascular Surgery

## 2019-01-18 VITALS — BP 107/73 | HR 70 | Temp 97.9°F | Resp 18 | Ht 68.0 in | Wt 215.0 lb

## 2019-01-18 DIAGNOSIS — I83813 Varicose veins of bilateral lower extremities with pain: Secondary | ICD-10-CM | POA: Diagnosis not present

## 2019-01-18 NOTE — Progress Notes (Signed)
[name] is a 58 y.o. male a long-standing history of varicose veins.  He most recently had a bleeding episode from the top of his right foot July 2019.  He has had bilateral ulcerations in the past that have been cared for by the wound center in [place].  He currently has a very superficial ulcer over the right lateral leg.  He also has a previous history of enlarged lymph nodes in the right groin.  He has had a previous lymph node biopsy.  He states this was negative.Marland Kitchen  He denies prior history of DVT.  He denies any family history of varicose veins.  He did have a gastric bypass in 2017 and lost 260 pounds.  He has had bleeding episodes from the left leg several years ago.  Other medical problems include obesity, sleep apnea both of which are currently stable. Complains of numbness and tingling which is been present on his feet for several years.  He is on methadone for a previous heroin habit.  He was last seen August 2019.  We were going to see him back in November 2019 for follow-up and consideration of laser ablation.  However due to unknown reasons he was lost to follow-up.  He most recently finally healed up most of his ulcers at the wound center.  Review of systems: He has no shortness of breath.  He has no chest pain.  Past Medical History:  Diagnosis Date  . CHF (congestive heart failure) (HCC)    PT. DENIES AT [facility]  . Depression   . Heart murmur    hx of small murmur   . Hepatitis    Hepatitis C not treated for Hx of  . History of kidney stones   . Morbid obesity ([place])   . Pneumonia   . Sleep apnea    hx of had gastic bypass  . Venous insufficiency of leg   . Venous stasis dermatitis     Past Surgical History:  Procedure Laterality Date  . CARPAL TUNNEL RELEASE     bilateral   . CORONARY ARTERY BYPASS GRAFT    . CYSTOSCOPY W/ URETERAL STENT PLACEMENT Bilateral 04/21/2016   Procedure: CYSTOSCOPY WITH RETROGRADE PYELOGRAM/URETERAL STENT PLACEMENT;  Surgeon: [name],  MD;  [facility];  Service: Urology;  Laterality: Bilateral;  . CYSTOSCOPY/URETEROSCOPY/HOLMIUM LASER/STENT PLACEMENT Bilateral 04/01/2016   Procedure: CYSTOSCOPY WITH RETROGRADE AND  STENT PLACEMENT;  Surgeon: [name], MD;  [facility];  Service: Urology;  Laterality: Bilateral;  . CYSTOSCOPY/URETEROSCOPY/HOLMIUM LASER/STENT PLACEMENT Bilateral 04/21/2016   Procedure: CYSTOSCOPY/URETEROSCOPY/HOLMIUM LASER/STENT REPLACEMENT;  Surgeon: [name], MD;  [facility];  Service: Urology;  Laterality: Bilateral;  . EYE SURGERY     bil cataracts  . gastric bypass surgery   10/2015  . GASTROPLASTY DUODENAL SWITCH    . HERNIA REPAIR    . HOLMIUM LASER APPLICATION Bilateral [id number]   Procedure: HOLMIUM LASER APPLICATION;  Surgeon: [name], MD;  [facility];  Service: Urology;  Laterality: Bilateral;  . IR URETERAL STENT LEFT NEW ACCESS W/O SEP NEPHROSTOMY CATH  05/17/2018  . NEPHROLITHOTOMY Left 05/17/2018   Procedure: NEPHROLITHOTOMY PERCUTANEOUS;  Surgeon: [name], MD;  [facility];  Service: Urology;  Laterality: Left;    Current Outpatient Medications on File Prior to Visit  Medication Sig Dispense Refill  . ATORVASTATIN CALCIUM PO Take by mouth.    . ferrous sulfate 325 (65 FE) MG EC tablet Take 325 mg by mouth 3 (  three) times daily with meals.    . methadone (DOLOPHINE) 10 MG/5ML solution Take 205 mg by mouth daily.     Marland Kitchen triamterene-hydrochlorothiazide (MAXZIDE-25) 37.5-25 MG tablet TK 1 T PO QAM     No current facility-administered medications on file prior to visit.     No Known Allergies  Physical exam:  Vitals:   01/18/19 1314  BP: 107/73  Pulse: 70  Resp: 18  Temp: 97.9 F (36.6 C)  TempSrc: Temporal  SpO2: 98%  Weight: 215 lb (97.5 kg)  Height: 5\' 8"  (1.727 m)    Extremities: 2+ dorsalis pedis posterior tibial pulses bilaterally  Musculoskeletal:  right leg edema approximately 30% larger than the left leg,  persistent lymphocele right groin similar to previous in August 2019  Skin: Superficial ulcerations right lateral aspect of calf 3 discrete ulcers 2 cm length less than 1 mm depth, hemosiderin brawny staining circumferentially similar to August 2019 pictures, cluster of enlarged varicosities right medial upper calf 4 mm diameter  I repeated portions of his ultrasound with the SonoSite at the bedside today.  Greater saphenous vein in the right leg was 6 mm in diameter.  However there may be some spots that are not in continuity up at the level of the high thigh and up to the level groin.  There is also a persistent lymphocele in the right groin.  In the left lower extremity greater saphenous vein was 4 mm in diameter and fairly uniform all the way from the knee to the saphenofemoral junction.  Both of these been shown on previous duplex ultrasounds to have diffuse reflux.  Assessment: Patient with chronic intermittent venous stasis wounds bilateral lower extremities.  He has evidence of lymphedema in the right leg but also evidence of superficial venous reflux.  He also has similar episodes in the left leg.  Right leg is certainly more swollen than the right due to the lymphedema component.  Plan: I discussed the patient is a possibility of laser ablation of the right greater saphenous vein with stab avulsions 10-20 in the right leg to help reduce swelling symptoms and decreased wound recurrences in the right leg.  I did discuss with him today that the laser fiber may not travel completely up to the groin due to some areas of scar in the vein.  However we would try to do a laser ablation and stab avulsions in the right leg.  In the left lower extremity the anatomy was more straightforward and he should be a good candidate for laser ablation in the left leg.  Risk benefits possible complications of procedure details were discussed with patient today include not limited to bleeding infection DVT risk.  He  understands and agrees to proceed.  We will work on try to get this approved through his insurance company.  Ruta Hinds, MD [facility] Office: [phone] Pager: [phone]

## 2019-01-26 ENCOUNTER — Other Ambulatory Visit: Payer: Self-pay | Admitting: *Deleted

## 2019-01-26 DIAGNOSIS — I83813 Varicose veins of bilateral lower extremities with pain: Secondary | ICD-10-CM

## 2019-01-27 DIAGNOSIS — D510 Vitamin B12 deficiency anemia due to intrinsic factor deficiency: Secondary | ICD-10-CM | POA: Diagnosis not present

## 2019-01-27 DIAGNOSIS — D519 Vitamin B12 deficiency anemia, unspecified: Secondary | ICD-10-CM | POA: Diagnosis not present

## 2019-01-30 DIAGNOSIS — F1120 Opioid dependence, uncomplicated: Secondary | ICD-10-CM | POA: Diagnosis not present

## 2019-02-13 DIAGNOSIS — F1120 Opioid dependence, uncomplicated: Secondary | ICD-10-CM | POA: Diagnosis not present

## 2019-02-27 DIAGNOSIS — F1120 Opioid dependence, uncomplicated: Secondary | ICD-10-CM | POA: Diagnosis not present

## 2019-03-01 ENCOUNTER — Other Ambulatory Visit: Payer: PPO | Admitting: Vascular Surgery

## 2019-03-10 DIAGNOSIS — F1120 Opioid dependence, uncomplicated: Secondary | ICD-10-CM | POA: Diagnosis not present

## 2019-03-14 DIAGNOSIS — Z6831 Body mass index (BMI) 31.0-31.9, adult: Secondary | ICD-10-CM | POA: Diagnosis not present

## 2019-03-14 DIAGNOSIS — D649 Anemia, unspecified: Secondary | ICD-10-CM | POA: Diagnosis not present

## 2019-03-14 DIAGNOSIS — R0789 Other chest pain: Secondary | ICD-10-CM | POA: Diagnosis not present

## 2019-03-14 DIAGNOSIS — R109 Unspecified abdominal pain: Secondary | ICD-10-CM | POA: Diagnosis not present

## 2019-03-15 ENCOUNTER — Encounter (HOSPITAL_COMMUNITY): Payer: PPO

## 2019-03-15 ENCOUNTER — Ambulatory Visit: Payer: PPO | Admitting: Vascular Surgery

## 2019-03-20 DIAGNOSIS — F1120 Opioid dependence, uncomplicated: Secondary | ICD-10-CM | POA: Diagnosis not present

## 2019-03-21 DIAGNOSIS — D649 Anemia, unspecified: Secondary | ICD-10-CM | POA: Diagnosis not present

## 2019-04-03 DIAGNOSIS — F1120 Opioid dependence, uncomplicated: Secondary | ICD-10-CM | POA: Diagnosis not present

## 2019-04-12 ENCOUNTER — Other Ambulatory Visit: Payer: PPO | Admitting: Vascular Surgery

## 2019-04-12 ENCOUNTER — Encounter: Payer: Self-pay | Admitting: Vascular Surgery

## 2019-04-17 DIAGNOSIS — F1120 Opioid dependence, uncomplicated: Secondary | ICD-10-CM | POA: Diagnosis not present

## 2019-04-19 ENCOUNTER — Ambulatory Visit: Payer: PPO | Admitting: Vascular Surgery

## 2019-04-19 ENCOUNTER — Encounter (HOSPITAL_COMMUNITY): Payer: PPO

## 2019-05-01 DIAGNOSIS — F1120 Opioid dependence, uncomplicated: Secondary | ICD-10-CM | POA: Diagnosis not present

## 2019-05-15 DIAGNOSIS — F1120 Opioid dependence, uncomplicated: Secondary | ICD-10-CM | POA: Diagnosis not present

## 2019-05-29 DIAGNOSIS — F1120 Opioid dependence, uncomplicated: Secondary | ICD-10-CM | POA: Diagnosis not present

## 2019-06-12 DIAGNOSIS — F1120 Opioid dependence, uncomplicated: Secondary | ICD-10-CM | POA: Diagnosis not present

## 2019-06-26 DIAGNOSIS — F1120 Opioid dependence, uncomplicated: Secondary | ICD-10-CM | POA: Diagnosis not present

## 2019-07-10 DIAGNOSIS — F1120 Opioid dependence, uncomplicated: Secondary | ICD-10-CM | POA: Diagnosis not present

## 2019-07-17 DIAGNOSIS — Z23 Encounter for immunization: Secondary | ICD-10-CM | POA: Diagnosis not present

## 2019-07-24 DIAGNOSIS — F1120 Opioid dependence, uncomplicated: Secondary | ICD-10-CM | POA: Diagnosis not present

## 2019-08-07 DIAGNOSIS — F1120 Opioid dependence, uncomplicated: Secondary | ICD-10-CM | POA: Diagnosis not present

## 2019-08-14 DIAGNOSIS — Z23 Encounter for immunization: Secondary | ICD-10-CM | POA: Diagnosis not present

## 2019-08-21 DIAGNOSIS — F1120 Opioid dependence, uncomplicated: Secondary | ICD-10-CM | POA: Diagnosis not present

## 2019-09-04 DIAGNOSIS — F1120 Opioid dependence, uncomplicated: Secondary | ICD-10-CM | POA: Diagnosis not present

## 2019-09-05 DIAGNOSIS — I890 Lymphedema, not elsewhere classified: Secondary | ICD-10-CM | POA: Diagnosis not present

## 2019-09-05 DIAGNOSIS — Z6831 Body mass index (BMI) 31.0-31.9, adult: Secondary | ICD-10-CM | POA: Diagnosis not present

## 2019-09-05 DIAGNOSIS — F17200 Nicotine dependence, unspecified, uncomplicated: Secondary | ICD-10-CM | POA: Diagnosis not present

## 2019-09-05 DIAGNOSIS — L03119 Cellulitis of unspecified part of limb: Secondary | ICD-10-CM | POA: Diagnosis not present

## 2019-09-18 DIAGNOSIS — F1111 Opioid abuse, in remission: Secondary | ICD-10-CM | POA: Diagnosis not present

## 2019-10-02 DIAGNOSIS — F1111 Opioid abuse, in remission: Secondary | ICD-10-CM | POA: Diagnosis not present

## 2019-10-16 DIAGNOSIS — J449 Chronic obstructive pulmonary disease, unspecified: Secondary | ICD-10-CM | POA: Diagnosis not present

## 2019-10-16 DIAGNOSIS — F1910 Other psychoactive substance abuse, uncomplicated: Secondary | ICD-10-CM | POA: Diagnosis not present

## 2019-10-16 DIAGNOSIS — G4733 Obstructive sleep apnea (adult) (pediatric): Secondary | ICD-10-CM | POA: Diagnosis not present

## 2019-10-16 DIAGNOSIS — Z9884 Bariatric surgery status: Secondary | ICD-10-CM | POA: Diagnosis not present

## 2019-10-16 DIAGNOSIS — L97819 Non-pressure chronic ulcer of other part of right lower leg with unspecified severity: Secondary | ICD-10-CM | POA: Diagnosis not present

## 2019-10-16 DIAGNOSIS — M6281 Muscle weakness (generalized): Secondary | ICD-10-CM | POA: Diagnosis not present

## 2019-10-16 DIAGNOSIS — M25551 Pain in right hip: Secondary | ICD-10-CM | POA: Diagnosis not present

## 2019-10-16 DIAGNOSIS — S71141A Puncture wound with foreign body, right thigh, initial encounter: Secondary | ICD-10-CM | POA: Diagnosis not present

## 2019-10-16 DIAGNOSIS — F17200 Nicotine dependence, unspecified, uncomplicated: Secondary | ICD-10-CM | POA: Diagnosis not present

## 2019-10-16 DIAGNOSIS — F1921 Other psychoactive substance dependence, in remission: Secondary | ICD-10-CM | POA: Diagnosis not present

## 2019-10-16 DIAGNOSIS — L03115 Cellulitis of right lower limb: Secondary | ICD-10-CM | POA: Diagnosis not present

## 2019-10-16 DIAGNOSIS — K219 Gastro-esophageal reflux disease without esophagitis: Secondary | ICD-10-CM | POA: Diagnosis not present

## 2019-10-16 DIAGNOSIS — Z9181 History of falling: Secondary | ICD-10-CM | POA: Diagnosis not present

## 2019-10-16 DIAGNOSIS — D649 Anemia, unspecified: Secondary | ICD-10-CM | POA: Diagnosis not present

## 2019-10-16 DIAGNOSIS — R52 Pain, unspecified: Secondary | ICD-10-CM | POA: Diagnosis not present

## 2019-10-16 DIAGNOSIS — I2510 Atherosclerotic heart disease of native coronary artery without angina pectoris: Secondary | ICD-10-CM | POA: Diagnosis not present

## 2019-10-16 DIAGNOSIS — F17210 Nicotine dependence, cigarettes, uncomplicated: Secondary | ICD-10-CM | POA: Diagnosis not present

## 2019-10-16 DIAGNOSIS — Z87442 Personal history of urinary calculi: Secondary | ICD-10-CM | POA: Diagnosis not present

## 2019-10-16 DIAGNOSIS — I872 Venous insufficiency (chronic) (peripheral): Secondary | ICD-10-CM | POA: Diagnosis not present

## 2019-10-16 DIAGNOSIS — S2231XA Fracture of one rib, right side, initial encounter for closed fracture: Secondary | ICD-10-CM | POA: Diagnosis not present

## 2019-10-16 DIAGNOSIS — S72141A Displaced intertrochanteric fracture of right femur, initial encounter for closed fracture: Secondary | ICD-10-CM | POA: Diagnosis not present

## 2019-10-16 DIAGNOSIS — R269 Unspecified abnormalities of gait and mobility: Secondary | ICD-10-CM | POA: Diagnosis not present

## 2019-10-16 DIAGNOSIS — W19XXXA Unspecified fall, initial encounter: Secondary | ICD-10-CM | POA: Diagnosis not present

## 2019-10-16 DIAGNOSIS — Z79891 Long term (current) use of opiate analgesic: Secondary | ICD-10-CM | POA: Diagnosis not present

## 2019-10-16 DIAGNOSIS — Z8711 Personal history of peptic ulcer disease: Secondary | ICD-10-CM | POA: Diagnosis not present

## 2019-10-16 DIAGNOSIS — M1611 Unilateral primary osteoarthritis, right hip: Secondary | ICD-10-CM | POA: Diagnosis not present

## 2019-10-16 DIAGNOSIS — R0902 Hypoxemia: Secondary | ICD-10-CM | POA: Diagnosis not present

## 2019-10-16 DIAGNOSIS — F1111 Opioid abuse, in remission: Secondary | ICD-10-CM | POA: Diagnosis not present

## 2019-10-16 DIAGNOSIS — E785 Hyperlipidemia, unspecified: Secondary | ICD-10-CM | POA: Diagnosis not present

## 2019-10-16 DIAGNOSIS — I83018 Varicose veins of right lower extremity with ulcer other part of lower leg: Secondary | ICD-10-CM | POA: Diagnosis not present

## 2019-10-17 HISTORY — PX: HIP FRACTURE SURGERY: SHX118

## 2019-10-21 DIAGNOSIS — S72141S Displaced intertrochanteric fracture of right femur, sequela: Secondary | ICD-10-CM | POA: Diagnosis not present

## 2019-10-21 DIAGNOSIS — S72141A Displaced intertrochanteric fracture of right femur, initial encounter for closed fracture: Secondary | ICD-10-CM | POA: Diagnosis not present

## 2019-10-21 DIAGNOSIS — I872 Venous insufficiency (chronic) (peripheral): Secondary | ICD-10-CM | POA: Diagnosis not present

## 2019-10-21 DIAGNOSIS — F1910 Other psychoactive substance abuse, uncomplicated: Secondary | ICD-10-CM | POA: Diagnosis not present

## 2019-10-21 DIAGNOSIS — M25551 Pain in right hip: Secondary | ICD-10-CM | POA: Diagnosis not present

## 2019-10-21 DIAGNOSIS — G4733 Obstructive sleep apnea (adult) (pediatric): Secondary | ICD-10-CM | POA: Diagnosis not present

## 2019-10-21 DIAGNOSIS — Z4789 Encounter for other orthopedic aftercare: Secondary | ICD-10-CM | POA: Diagnosis not present

## 2019-10-21 DIAGNOSIS — I83019 Varicose veins of right lower extremity with ulcer of unspecified site: Secondary | ICD-10-CM | POA: Diagnosis not present

## 2019-10-21 DIAGNOSIS — F1921 Other psychoactive substance dependence, in remission: Secondary | ICD-10-CM | POA: Diagnosis not present

## 2019-10-21 DIAGNOSIS — R269 Unspecified abnormalities of gait and mobility: Secondary | ICD-10-CM | POA: Diagnosis not present

## 2019-10-21 DIAGNOSIS — Z9181 History of falling: Secondary | ICD-10-CM | POA: Diagnosis not present

## 2019-10-21 DIAGNOSIS — L97919 Non-pressure chronic ulcer of unspecified part of right lower leg with unspecified severity: Secondary | ICD-10-CM | POA: Diagnosis not present

## 2019-10-21 DIAGNOSIS — D649 Anemia, unspecified: Secondary | ICD-10-CM | POA: Diagnosis not present

## 2019-10-21 DIAGNOSIS — M6281 Muscle weakness (generalized): Secondary | ICD-10-CM | POA: Diagnosis not present

## 2019-10-21 DIAGNOSIS — K219 Gastro-esophageal reflux disease without esophagitis: Secondary | ICD-10-CM | POA: Diagnosis not present

## 2019-10-21 DIAGNOSIS — L03115 Cellulitis of right lower limb: Secondary | ICD-10-CM | POA: Diagnosis not present

## 2019-10-21 DIAGNOSIS — L97819 Non-pressure chronic ulcer of other part of right lower leg with unspecified severity: Secondary | ICD-10-CM | POA: Diagnosis not present

## 2019-10-21 DIAGNOSIS — S71141A Puncture wound with foreign body, right thigh, initial encounter: Secondary | ICD-10-CM | POA: Diagnosis not present

## 2019-10-23 DIAGNOSIS — L97919 Non-pressure chronic ulcer of unspecified part of right lower leg with unspecified severity: Secondary | ICD-10-CM | POA: Diagnosis not present

## 2019-10-23 DIAGNOSIS — Z4789 Encounter for other orthopedic aftercare: Secondary | ICD-10-CM | POA: Diagnosis not present

## 2019-10-23 DIAGNOSIS — S72141S Displaced intertrochanteric fracture of right femur, sequela: Secondary | ICD-10-CM | POA: Diagnosis not present

## 2019-10-23 DIAGNOSIS — I83019 Varicose veins of right lower extremity with ulcer of unspecified site: Secondary | ICD-10-CM | POA: Diagnosis not present

## 2019-10-24 DIAGNOSIS — S72141S Displaced intertrochanteric fracture of right femur, sequela: Secondary | ICD-10-CM | POA: Diagnosis not present

## 2019-10-24 DIAGNOSIS — M6281 Muscle weakness (generalized): Secondary | ICD-10-CM | POA: Diagnosis not present

## 2019-10-24 DIAGNOSIS — D649 Anemia, unspecified: Secondary | ICD-10-CM | POA: Diagnosis not present

## 2019-10-24 DIAGNOSIS — F1921 Other psychoactive substance dependence, in remission: Secondary | ICD-10-CM | POA: Diagnosis not present

## 2019-10-27 DIAGNOSIS — R531 Weakness: Secondary | ICD-10-CM | POA: Diagnosis not present

## 2019-10-27 DIAGNOSIS — F1111 Opioid abuse, in remission: Secondary | ICD-10-CM | POA: Diagnosis not present

## 2019-10-29 DIAGNOSIS — B9562 Methicillin resistant Staphylococcus aureus infection as the cause of diseases classified elsewhere: Secondary | ICD-10-CM | POA: Diagnosis not present

## 2019-10-29 DIAGNOSIS — I739 Peripheral vascular disease, unspecified: Secondary | ICD-10-CM | POA: Diagnosis not present

## 2019-10-29 DIAGNOSIS — L97311 Non-pressure chronic ulcer of right ankle limited to breakdown of skin: Secondary | ICD-10-CM | POA: Diagnosis not present

## 2019-10-29 DIAGNOSIS — J449 Chronic obstructive pulmonary disease, unspecified: Secondary | ICD-10-CM | POA: Diagnosis not present

## 2019-10-29 DIAGNOSIS — G4733 Obstructive sleep apnea (adult) (pediatric): Secondary | ICD-10-CM | POA: Diagnosis not present

## 2019-10-29 DIAGNOSIS — I87311 Chronic venous hypertension (idiopathic) with ulcer of right lower extremity: Secondary | ICD-10-CM | POA: Diagnosis not present

## 2019-10-29 DIAGNOSIS — Z452 Encounter for adjustment and management of vascular access device: Secondary | ICD-10-CM | POA: Diagnosis not present

## 2019-10-29 DIAGNOSIS — Z79891 Long term (current) use of opiate analgesic: Secondary | ICD-10-CM | POA: Diagnosis not present

## 2019-10-29 DIAGNOSIS — E785 Hyperlipidemia, unspecified: Secondary | ICD-10-CM | POA: Diagnosis not present

## 2019-10-29 DIAGNOSIS — L03115 Cellulitis of right lower limb: Secondary | ICD-10-CM | POA: Diagnosis not present

## 2019-10-29 DIAGNOSIS — F1921 Other psychoactive substance dependence, in remission: Secondary | ICD-10-CM | POA: Diagnosis not present

## 2019-10-29 DIAGNOSIS — Z8711 Personal history of peptic ulcer disease: Secondary | ICD-10-CM | POA: Diagnosis not present

## 2019-10-29 DIAGNOSIS — Z9181 History of falling: Secondary | ICD-10-CM | POA: Diagnosis not present

## 2019-10-29 DIAGNOSIS — D649 Anemia, unspecified: Secondary | ICD-10-CM | POA: Diagnosis not present

## 2019-10-29 DIAGNOSIS — L97312 Non-pressure chronic ulcer of right ankle with fat layer exposed: Secondary | ICD-10-CM | POA: Diagnosis not present

## 2019-10-29 DIAGNOSIS — G629 Polyneuropathy, unspecified: Secondary | ICD-10-CM | POA: Diagnosis not present

## 2019-10-29 DIAGNOSIS — I872 Venous insufficiency (chronic) (peripheral): Secondary | ICD-10-CM | POA: Diagnosis not present

## 2019-10-29 DIAGNOSIS — B9689 Other specified bacterial agents as the cause of diseases classified elsewhere: Secondary | ICD-10-CM | POA: Diagnosis not present

## 2019-10-29 DIAGNOSIS — S72141D Displaced intertrochanteric fracture of right femur, subsequent encounter for closed fracture with routine healing: Secondary | ICD-10-CM | POA: Diagnosis not present

## 2019-10-29 DIAGNOSIS — L97811 Non-pressure chronic ulcer of other part of right lower leg limited to breakdown of skin: Secondary | ICD-10-CM | POA: Diagnosis not present

## 2019-10-29 DIAGNOSIS — Z7982 Long term (current) use of aspirin: Secondary | ICD-10-CM | POA: Diagnosis not present

## 2019-10-29 DIAGNOSIS — Z4789 Encounter for other orthopedic aftercare: Secondary | ICD-10-CM | POA: Diagnosis not present

## 2019-10-29 DIAGNOSIS — Z720 Tobacco use: Secondary | ICD-10-CM | POA: Diagnosis not present

## 2019-10-29 DIAGNOSIS — D509 Iron deficiency anemia, unspecified: Secondary | ICD-10-CM | POA: Diagnosis not present

## 2019-11-02 DIAGNOSIS — M1990 Unspecified osteoarthritis, unspecified site: Secondary | ICD-10-CM | POA: Diagnosis not present

## 2019-11-02 DIAGNOSIS — I890 Lymphedema, not elsewhere classified: Secondary | ICD-10-CM | POA: Diagnosis not present

## 2019-11-02 DIAGNOSIS — Z6831 Body mass index (BMI) 31.0-31.9, adult: Secondary | ICD-10-CM | POA: Diagnosis not present

## 2019-11-02 DIAGNOSIS — G8929 Other chronic pain: Secondary | ICD-10-CM | POA: Diagnosis not present

## 2019-11-03 DIAGNOSIS — F1111 Opioid abuse, in remission: Secondary | ICD-10-CM | POA: Diagnosis not present

## 2019-11-09 DIAGNOSIS — F1111 Opioid abuse, in remission: Secondary | ICD-10-CM | POA: Diagnosis not present

## 2019-11-16 DIAGNOSIS — F1111 Opioid abuse, in remission: Secondary | ICD-10-CM | POA: Diagnosis not present

## 2019-11-17 DIAGNOSIS — Z23 Encounter for immunization: Secondary | ICD-10-CM | POA: Diagnosis not present

## 2019-11-17 DIAGNOSIS — Z0000 Encounter for general adult medical examination without abnormal findings: Secondary | ICD-10-CM | POA: Diagnosis not present

## 2019-11-17 DIAGNOSIS — I2510 Atherosclerotic heart disease of native coronary artery without angina pectoris: Secondary | ICD-10-CM | POA: Diagnosis not present

## 2019-11-17 DIAGNOSIS — F1111 Opioid abuse, in remission: Secondary | ICD-10-CM | POA: Diagnosis not present

## 2019-11-17 DIAGNOSIS — E7800 Pure hypercholesterolemia, unspecified: Secondary | ICD-10-CM | POA: Diagnosis not present

## 2019-11-17 DIAGNOSIS — I890 Lymphedema, not elsewhere classified: Secondary | ICD-10-CM | POA: Diagnosis not present

## 2019-11-17 DIAGNOSIS — Z6831 Body mass index (BMI) 31.0-31.9, adult: Secondary | ICD-10-CM | POA: Diagnosis not present

## 2019-11-17 DIAGNOSIS — M8580 Other specified disorders of bone density and structure, unspecified site: Secondary | ICD-10-CM | POA: Diagnosis not present

## 2019-11-17 DIAGNOSIS — M1990 Unspecified osteoarthritis, unspecified site: Secondary | ICD-10-CM | POA: Diagnosis not present

## 2019-11-17 DIAGNOSIS — G8929 Other chronic pain: Secondary | ICD-10-CM | POA: Diagnosis not present

## 2019-11-17 DIAGNOSIS — Z125 Encounter for screening for malignant neoplasm of prostate: Secondary | ICD-10-CM | POA: Diagnosis not present

## 2019-11-17 DIAGNOSIS — Z1331 Encounter for screening for depression: Secondary | ICD-10-CM | POA: Diagnosis not present

## 2019-11-17 DIAGNOSIS — F17200 Nicotine dependence, unspecified, uncomplicated: Secondary | ICD-10-CM | POA: Diagnosis not present

## 2019-11-17 DIAGNOSIS — Z79899 Other long term (current) drug therapy: Secondary | ICD-10-CM | POA: Diagnosis not present

## 2019-11-18 DIAGNOSIS — F1111 Opioid abuse, in remission: Secondary | ICD-10-CM | POA: Diagnosis not present

## 2019-11-20 DIAGNOSIS — F1111 Opioid abuse, in remission: Secondary | ICD-10-CM | POA: Diagnosis not present

## 2019-11-24 DIAGNOSIS — L97219 Non-pressure chronic ulcer of right calf with unspecified severity: Secondary | ICD-10-CM | POA: Diagnosis not present

## 2019-11-24 DIAGNOSIS — I83012 Varicose veins of right lower extremity with ulcer of calf: Secondary | ICD-10-CM | POA: Diagnosis not present

## 2019-11-25 DIAGNOSIS — F1111 Opioid abuse, in remission: Secondary | ICD-10-CM | POA: Diagnosis not present

## 2019-11-27 DIAGNOSIS — F1111 Opioid abuse, in remission: Secondary | ICD-10-CM | POA: Diagnosis not present

## 2019-11-28 ENCOUNTER — Encounter: Payer: Self-pay | Admitting: Cardiology

## 2019-11-28 DIAGNOSIS — D509 Iron deficiency anemia, unspecified: Secondary | ICD-10-CM | POA: Diagnosis not present

## 2019-11-28 DIAGNOSIS — G4733 Obstructive sleep apnea (adult) (pediatric): Secondary | ICD-10-CM | POA: Diagnosis not present

## 2019-11-28 DIAGNOSIS — E785 Hyperlipidemia, unspecified: Secondary | ICD-10-CM | POA: Diagnosis not present

## 2019-11-28 DIAGNOSIS — S72141D Displaced intertrochanteric fracture of right femur, subsequent encounter for closed fracture with routine healing: Secondary | ICD-10-CM | POA: Diagnosis not present

## 2019-11-28 DIAGNOSIS — L97311 Non-pressure chronic ulcer of right ankle limited to breakdown of skin: Secondary | ICD-10-CM | POA: Diagnosis not present

## 2019-11-28 DIAGNOSIS — J449 Chronic obstructive pulmonary disease, unspecified: Secondary | ICD-10-CM | POA: Diagnosis not present

## 2019-11-28 DIAGNOSIS — Z7982 Long term (current) use of aspirin: Secondary | ICD-10-CM | POA: Diagnosis not present

## 2019-11-28 DIAGNOSIS — Z720 Tobacco use: Secondary | ICD-10-CM | POA: Diagnosis not present

## 2019-11-28 DIAGNOSIS — I872 Venous insufficiency (chronic) (peripheral): Secondary | ICD-10-CM | POA: Diagnosis not present

## 2019-11-28 DIAGNOSIS — L97312 Non-pressure chronic ulcer of right ankle with fat layer exposed: Secondary | ICD-10-CM | POA: Diagnosis not present

## 2019-11-28 DIAGNOSIS — L03115 Cellulitis of right lower limb: Secondary | ICD-10-CM | POA: Diagnosis not present

## 2019-11-28 DIAGNOSIS — L97811 Non-pressure chronic ulcer of other part of right lower leg limited to breakdown of skin: Secondary | ICD-10-CM | POA: Diagnosis not present

## 2019-11-28 DIAGNOSIS — F1921 Other psychoactive substance dependence, in remission: Secondary | ICD-10-CM | POA: Diagnosis not present

## 2019-11-28 DIAGNOSIS — Z8711 Personal history of peptic ulcer disease: Secondary | ICD-10-CM | POA: Diagnosis not present

## 2019-11-28 DIAGNOSIS — Z9181 History of falling: Secondary | ICD-10-CM | POA: Diagnosis not present

## 2019-11-28 DIAGNOSIS — Z79891 Long term (current) use of opiate analgesic: Secondary | ICD-10-CM | POA: Diagnosis not present

## 2019-12-01 ENCOUNTER — Encounter: Payer: Self-pay | Admitting: Cardiology

## 2019-12-01 ENCOUNTER — Ambulatory Visit: Payer: PPO | Admitting: Cardiology

## 2019-12-01 ENCOUNTER — Other Ambulatory Visit: Payer: Self-pay

## 2019-12-01 VITALS — BP 108/72 | HR 59 | Ht 71.0 in | Wt 227.0 lb

## 2019-12-01 DIAGNOSIS — R0600 Dyspnea, unspecified: Secondary | ICD-10-CM

## 2019-12-01 DIAGNOSIS — I2510 Atherosclerotic heart disease of native coronary artery without angina pectoris: Secondary | ICD-10-CM

## 2019-12-01 DIAGNOSIS — I890 Lymphedema, not elsewhere classified: Secondary | ICD-10-CM | POA: Diagnosis not present

## 2019-12-01 DIAGNOSIS — I872 Venous insufficiency (chronic) (peripheral): Secondary | ICD-10-CM | POA: Diagnosis not present

## 2019-12-01 DIAGNOSIS — R0609 Other forms of dyspnea: Secondary | ICD-10-CM

## 2019-12-01 NOTE — Progress Notes (Signed)
Cardiology Consultation:    Date:  12/01/2019   ID:  [name], DOB January 31, 1961, MRN [id number]  PCP:  [name], MD  Cardiologist:  [name], MD   Referring MD: [name], MD   No chief complaint on file. I have swelling of my legs  History of Present Illness:    [name] is a 59 y.o. male who is being seen today for the evaluation of leg swelling at the request of [name], [name], MD.  With past medical history significant for morbid obesity, he eventually ended up having gastric bypass surgery, lost significant amount of weight and started feeling better.  Bleeding complaint right now and that is the reason why he is here is swelling of lower extremities with open wound on the right pretibial area.  He said he got a problem with the swelling for long time and I remember seeing him in 2016 or 17 in my office because of this problem.  Swelling is slightly worse at evening time but that is present during the day.  He tried to keep his leg elevated.  He does have a nurse visiting him 3 times a week changing dressing, however, according to patient wound is getting larger and bothers him a lot.  On top of that his leg is somewhat swollen that he cannot fit pants on his leg.  Denies having any cardiac complaints there is no chest pain tightness squeezing pressure burning chest no shortness of breath no dizziness no passing out.  Recently he sustained fracture of his right hip and had surgery done about a month ago.  I did review notes from that visit in the hospital that was done in round of hospital, no echocardiogram done at that time.  She also have history of polysubstance abuse.   Past Medical History:  Diagnosis Date  . Anemia   . Arthritis   . CAD (coronary artery disease)   . CHF (congestive heart failure) (HCC)    PT. DENIES AT [facility]  . Depression   . Heart murmur    hx of small murmur   . Hepatitis    Hepatitis C not treated  for Hx of  . History of kidney stones   . Lymphocele    Right groin  . Morbid obesity ([place])   . Pneumonia   . Sleep apnea    hx of had gastic bypass  . Venous insufficiency of leg   . Venous stasis dermatitis     Past Surgical History:  Procedure Laterality Date  . CARPAL TUNNEL RELEASE     bilateral   . CORONARY ARTERY BYPASS GRAFT    . CYSTOSCOPY W/ URETERAL STENT PLACEMENT Bilateral 04/21/2016   Procedure: CYSTOSCOPY WITH RETROGRADE PYELOGRAM/URETERAL STENT PLACEMENT;  Surgeon: [name], MD;  Location: [facility];  Service: Urology;  Laterality: Bilateral;  . CYSTOSCOPY/URETEROSCOPY/HOLMIUM LASER/STENT PLACEMENT Bilateral 04/01/2016   Procedure: CYSTOSCOPY WITH RETROGRADE AND  STENT PLACEMENT;  Surgeon: [name], MD;  Location: [facility];  Service: Urology;  Laterality: Bilateral;  . CYSTOSCOPY/URETEROSCOPY/HOLMIUM LASER/STENT PLACEMENT Bilateral 04/21/2016   Procedure: CYSTOSCOPY/URETEROSCOPY/HOLMIUM LASER/STENT REPLACEMENT;  Surgeon: [name], MD;  Location: [facility];  Service: Urology;  Laterality: Bilateral;  . EYE SURGERY     bil cataracts  . gastric bypass surgery   10/2015  . GASTROPLASTY DUODENAL SWITCH  10/17/2015  . HERNIA REPAIR    . HIP FRACTURE SURGERY Right 10/17/2019  Using Gamma Nail performed by Dr. Donivan Scull  . HOLMIUM LASER APPLICATION Bilateral 09/16/2945   Procedure: HOLMIUM LASER APPLICATION;  Surgeon: [name], MD;  Location: [facility];  Service: Urology;  Laterality: Bilateral;  . IR URETERAL STENT LEFT NEW ACCESS W/O SEP NEPHROSTOMY CATH  05/17/2018  . LYMPH NODE BIOPSY Right 01/29/2017   Inguinal, performed by Dr. Noberto Retort  . NEPHROLITHOTOMY Left 05/17/2018   Procedure: NEPHROLITHOTOMY PERCUTANEOUS;  Surgeon: [name], MD;  Location: [facility];  Service: Urology;  Laterality: Left;    Current Medications: Current Meds  Medication Sig  . alendronate (FOSAMAX) 70 MG tablet Take 70 mg by mouth once a week.  Marland Kitchen aspirin 325 MG tablet  Take 325 mg by mouth daily.  . methadone (METHADOSE) 40 MG disintegrating tablet Take 195 mg by mouth daily.   . rosuvastatin (CRESTOR) 5 MG tablet Take 1 tablet by mouth daily.  Marland Kitchen torsemide (DEMADEX) 20 MG tablet Take 20 mg by mouth daily.     Allergies:   Patient has no known allergies.   Social History   Socioeconomic History  . Marital status: Legally Separated    Spouse name: Not on file  . Number of children: Not on file  . Years of education: Not on file  . Highest education level: Not on file  Occupational History  . Not on file  Tobacco Use  . Smoking status: Current Some Day Smoker    Packs/day: 0.50    Years: 35.00    Pack years: 17.50    Types: Cigarettes    Last attempt to quit: 05/17/2015    Years since quitting: 4.5  . Smokeless tobacco: Never Used  . Tobacco comment: currently only smoking 0.5 ppd  Vaping Use  . Vaping Use: Never used  Substance and Sexual Activity  . Alcohol use: No  . 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 Sexual activity: Not Currently  Other Topics Concern  . Not on file  Social History Narrative   Divorced man.  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  Lives in [place], [place].  4 children all in good health.  3 sisters in good health.  Father in poor health near by   Social Determinants of Health   Financial Resource Strain:   . Difficulty of Paying Living Expenses: Not on file  Food Insecurity:   . Worried About Charity fundraiser in the Last Year: Not on file  . Ran Out of Food in the Last Year: Not on file  Transportation Needs:   . Lack of Transportation (Medical): Not on file  . Lack of Transportation (Non-Medical): Not on file  Physical Activity:   . Days of Exercise per Week: Not on file  . Minutes of Exercise per Session: Not on file  Stress:   . Feeling of Stress : Not on file  Social Connections:     . Frequency of Communication with Friends and Family: Not on file  . Frequency of Social Gatherings with Friends and Family: Not on file  . Attends Religious Services: Not on file  . Active Member of Clubs or Organizations: Not on file  . Attends Archivist Meetings: Not on file  . Marital Status: Not on file     Family History: The patient's family history includes Colon cancer in his father; Diabetes in his father; Heart disease  in his father; Hypertension in his father; Leukemia in his mother. ROS:   Please see the history of present illness.    All 14 point review of systems negative except as described per history of present illness.  EKGs/Labs/Other Studies Reviewed:    The following studies were reviewed today:   EKG:  EKG is  ordered today.  The ekg ordered today demonstrates sinus bradycardia normal P interval normal QS complex duration morphology nonspecific ST segment changes.  Recent Labs: No results found for requested labs within last 8760 hours.  Recent Lipid Panel No results found for: CHOL, TRIG, HDL, CHOLHDL, VLDL, LDLCALC, LDLDIRECT  Physical Exam:    VS:  BP 108/72   Pulse (!) 59   Ht 5\' 11"  (1.803 m)   Wt 227 lb (103 kg)   SpO2 96%   BMI 31.66 kg/m     Wt Readings from Last 3 Encounters:  12/01/19 227 lb (103 kg)  11/17/19 223 lb (101.2 kg)  01/18/19 215 lb (97.5 kg)     GEN:  Well nourished, well developed in no acute distress HEENT: Normal NECK: No JVD; No carotid bruits LYMPHATICS: No lymphadenopathy CARDIAC: RRR, tones are distant there is systolic murmur grade 1/6 to 2/6 best heard right upper portion of the sternum, no rubs, no gallops RESPIRATORY:  Clear to auscultation without rales, wheezing or rhonchi  ABDOMEN: Soft, non-tender, non-distended MUSCULOSKELETAL: Bilateral leg edema with right is worse than the left, there is component of pitting edema but majority of this look like lymphedema. SKIN: Warm and dry NEUROLOGIC:   Alert and oriented x 3 PSYCHIATRIC:  Normal affect   ASSESSMENT:    1. Venous insufficiency of both lower extremities   2. Lymphedema of both lower extremities   3. Morbid obesity ([place])   4. Coronary artery disease involving native coronary artery of native heart without angina pectoris    PLAN:    In order of problems listed above:  1. Venous stasis in both lower extremities clearly worse on the right side.  He did not allow me to undress his leg.  He said today nurse put a dressing on.  He also got lymphedema of this leg.  Apparently according to patient he did have some lymph node resection from his groin since that time and lymphedema became worse which is understandable.  I will check his heart make sure that he is a lymphedema/pitting edema which is only partial pitting edema not related to his heart.  I will ask him to have an echocardiogram to assess left ventricle ejection fraction.  He also does have systolic murmur which is difficult to auscultate I need to make sure he does not have any significant valvular pathology.  I will not give him any medication until that will be clarified.  In the meantime he is scheduled to see vascular surgeon as well.  I also told him to keep his leg elevated.  Interestingly he noticed when he was in the hospital with his hip surgery he had compression devices on his legs and his legs look much better.  I told him he can benefit from keeping his leg up, walking on the regular basis as well as massages.  Apparently he did have DVT study done which was negative. 2. Morbid obesity significantly improved after gastric surgery, 3. There is diagnosis of coronary artery disease however I see no documentation of it.  He years ago he had a stress test done which showed no evidence of  ischemia.  I do not see indication for ischemia work-up at this stage but this is something that need to be revisited. 4. Dyslipidemia he is on cholesterol medication which I will  continue.   Medication Adjustments/Labs and Tests Ordered: Current medicines are reviewed at length with the patient today.  Concerns regarding medicines are outlined above.  No orders of the defined types were placed in this encounter.  No orders of the defined types were placed in this encounter.   Signed, Park Liter, MD, [facility]. 12/01/2019 2:42 PM    [place]

## 2019-12-01 NOTE — Patient Instructions (Signed)
Medication Instructions:  Your physician recommends that you continue on your current medications as directed. Please refer to the Current Medication list given to you today.  *If you need a refill on your cardiac medications before your next appointment, please call your pharmacy*   Lab Work: Your physician recommends that you return for lab work today: bmp, pro bnp   If you have labs (blood work) drawn today and your tests are completely normal, you will receive your results only by: Marland Kitchen MyChart Message (if you have MyChart) OR . A paper copy in the mail If you have any lab test that is abnormal or we need to change your treatment, we will call you to review the results.   Testing/Procedures: Your physician has requested that you have an echocardiogram. Echocardiography is a painless test that uses sound waves to create images of your heart. It provides your doctor with information about the size and shape of your heart and how well your heart's chambers and valves are working. This procedure takes approximately one hour. There are no restrictions for this procedure.     Follow-Up: At [facility], you and your health needs are our priority.  As part of our continuing mission to provide you with exceptional heart care, we have created designated Provider Care Teams.  These Care Teams include your primary Cardiologist (physician) and Advanced Practice Providers (APPs -  Physician Assistants and Nurse Practitioners) who all work together to provide you with the care you need, when you need it.  We recommend signing up for the patient portal called "MyChart".  Sign up information is provided on this After Visit Summary.  MyChart is used to connect with patients for Virtual Visits (Telemedicine).  Patients are able to view lab/test results, encounter notes, upcoming appointments, etc.  Non-urgent messages can be sent to your provider as well.   To learn more about what you can do with MyChart,  go to [email or web address].    Your next appointment:   3 month(s)  The format for your next appointment:   In Person  Provider:   [name], MD   Other Instructions   Echocardiogram An echocardiogram is a procedure that uses painless sound waves (ultrasound) to produce an image of the heart. Images from an echocardiogram can provide important information about:  Signs of coronary artery disease (CAD).  Aneurysm detection. An aneurysm is a weak or damaged part of an artery wall that bulges out from the normal force of blood pumping through the body.  Heart size and shape. Changes in the size or shape of the heart can be associated with certain conditions, including heart failure, aneurysm, and CAD.  Heart muscle function.  Heart valve function.  Signs of a past heart attack.  Fluid buildup around the heart.  Thickening of the heart muscle.  A tumor or infectious growth around the heart valves. Tell a health care provider about:  Any allergies you have.  All medicines you are taking, including vitamins, herbs, eye drops, creams, and over-the-counter medicines.  Any blood disorders you have.  Any surgeries you have had.  Any medical conditions you have.  Whether you are pregnant or may be pregnant. What are the risks? Generally, this is a safe procedure. However, problems may occur, including:  Allergic reaction to dye (contrast) that may be used during the procedure. What happens before the procedure? No specific preparation is needed. You may eat and drink normally. What happens during the procedure?   An  IV tube may be inserted into one of your veins.  You may receive contrast through this tube. A contrast is an injection that improves the quality of the pictures from your heart.  A gel will be applied to your chest.  A wand-like tool (transducer) will be moved over your chest. The gel will help to transmit the sound waves from the  transducer.  The sound waves will harmlessly bounce off of your heart to allow the heart images to be captured in real-time motion. The images will be recorded on a computer. The procedure may vary among health care providers and hospitals. What happens after the procedure?  You may return to your normal, everyday life, including diet, activities, and medicines, unless your health care provider tells you not to do that. Summary  An echocardiogram is a procedure that uses painless sound waves (ultrasound) to produce an image of the heart.  Images from an echocardiogram can provide important information about the size and shape of your heart, heart muscle function, heart valve function, and fluid buildup around your heart.  You do not need to do anything to prepare before this procedure. You may eat and drink normally.  After the echocardiogram is completed, you may return to your normal, everyday life, unless your health care provider tells you not to do that. This information is not intended to replace advice given to you by your health care provider. Make sure you discuss any questions you have with your health care provider. Document Revised: 07/21/2018 Document Reviewed: 05/02/2016 Elsevier Patient Education  [place].

## 2019-12-02 DIAGNOSIS — F1111 Opioid abuse, in remission: Secondary | ICD-10-CM | POA: Diagnosis not present

## 2019-12-02 LAB — BASIC METABOLIC PANEL
BUN/Creatinine Ratio: 18 (ref 9–20)
BUN: 11 mg/dL (ref 6–24)
CO2: 27 mmol/L (ref 20–29)
Calcium: 8.6 mg/dL — ABNORMAL LOW (ref 8.7–10.2)
Chloride: 106 mmol/L (ref 96–106)
Creatinine, Ser: 0.61 mg/dL — ABNORMAL LOW (ref 0.76–1.27)
GFR calc Af Amer: 127 mL/min/{1.73_m2} (ref >59)
GFR calc non Af Amer: 110 mL/min/{1.73_m2} (ref >59)
Glucose: 71 mg/dL (ref 65–99)
Potassium: 3.9 mmol/L (ref 3.5–5.2)
Sodium: 142 mmol/L (ref 134–144)

## 2019-12-02 LAB — PRO B NATRIURETIC PEPTIDE: NT-Pro BNP: 402 pg/mL — ABNORMAL HIGH (ref 0–210)

## 2019-12-04 DIAGNOSIS — F1111 Opioid abuse, in remission: Secondary | ICD-10-CM | POA: Diagnosis not present

## 2019-12-07 DIAGNOSIS — S72143A Displaced intertrochanteric fracture of unspecified femur, initial encounter for closed fracture: Secondary | ICD-10-CM | POA: Diagnosis not present

## 2019-12-09 DIAGNOSIS — F1111 Opioid abuse, in remission: Secondary | ICD-10-CM | POA: Diagnosis not present

## 2019-12-11 ENCOUNTER — Telehealth: Payer: Self-pay | Admitting: Cardiology

## 2019-12-11 DIAGNOSIS — Z79899 Other long term (current) drug therapy: Secondary | ICD-10-CM

## 2019-12-11 MED ORDER — POTASSIUM CHLORIDE ER 10 MEQ PO TBCR: 10.0000 meq | EXTENDED_RELEASE_TABLET | Freq: Every day | ORAL | 1 refills | Status: DC

## 2019-12-11 MED ORDER — TORSEMIDE 20 MG PO TABS: 20.0000 mg | ORAL_TABLET | Freq: Two times a day (BID) | ORAL | 1 refills | Status: DC

## 2019-12-11 NOTE — Telephone Encounter (Signed)
Called patient informed him of results and recommendations. Patient will increase torsemide to 20 mg twice daily and start potassium 10 meq daily and have labs redrawn in 6 weeks. No further questions.

## 2019-12-11 NOTE — Telephone Encounter (Signed)
Patient is calling back for his results

## 2019-12-13 DIAGNOSIS — F1111 Opioid abuse, in remission: Secondary | ICD-10-CM | POA: Diagnosis not present

## 2019-12-15 DIAGNOSIS — F1111 Opioid abuse, in remission: Secondary | ICD-10-CM | POA: Diagnosis not present

## 2019-12-19 DIAGNOSIS — F1111 Opioid abuse, in remission: Secondary | ICD-10-CM | POA: Diagnosis not present

## 2019-12-20 DIAGNOSIS — F1111 Opioid abuse, in remission: Secondary | ICD-10-CM | POA: Diagnosis not present

## 2019-12-22 DIAGNOSIS — F1111 Opioid abuse, in remission: Secondary | ICD-10-CM | POA: Diagnosis not present

## 2019-12-25 DIAGNOSIS — F1111 Opioid abuse, in remission: Secondary | ICD-10-CM | POA: Diagnosis not present

## 2019-12-25 DIAGNOSIS — M545 Low back pain: Secondary | ICD-10-CM | POA: Diagnosis not present

## 2019-12-25 DIAGNOSIS — G8929 Other chronic pain: Secondary | ICD-10-CM | POA: Diagnosis not present

## 2019-12-25 DIAGNOSIS — Z6830 Body mass index (BMI) 30.0-30.9, adult: Secondary | ICD-10-CM | POA: Diagnosis not present

## 2019-12-25 DIAGNOSIS — I898 Other specified noninfective disorders of lymphatic vessels and lymph nodes: Secondary | ICD-10-CM | POA: Diagnosis not present

## 2019-12-26 ENCOUNTER — Other Ambulatory Visit: Payer: PPO

## 2019-12-27 DIAGNOSIS — F1111 Opioid abuse, in remission: Secondary | ICD-10-CM | POA: Diagnosis not present

## 2019-12-28 DIAGNOSIS — Z9181 History of falling: Secondary | ICD-10-CM | POA: Diagnosis not present

## 2019-12-28 DIAGNOSIS — Z79891 Long term (current) use of opiate analgesic: Secondary | ICD-10-CM | POA: Diagnosis not present

## 2019-12-28 DIAGNOSIS — Z720 Tobacco use: Secondary | ICD-10-CM | POA: Diagnosis not present

## 2019-12-28 DIAGNOSIS — F1921 Other psychoactive substance dependence, in remission: Secondary | ICD-10-CM | POA: Diagnosis not present

## 2019-12-28 DIAGNOSIS — Z7982 Long term (current) use of aspirin: Secondary | ICD-10-CM | POA: Diagnosis not present

## 2019-12-28 DIAGNOSIS — L03115 Cellulitis of right lower limb: Secondary | ICD-10-CM | POA: Diagnosis not present

## 2019-12-28 DIAGNOSIS — L97311 Non-pressure chronic ulcer of right ankle limited to breakdown of skin: Secondary | ICD-10-CM | POA: Diagnosis not present

## 2019-12-28 DIAGNOSIS — L97312 Non-pressure chronic ulcer of right ankle with fat layer exposed: Secondary | ICD-10-CM | POA: Diagnosis not present

## 2019-12-28 DIAGNOSIS — L97811 Non-pressure chronic ulcer of other part of right lower leg limited to breakdown of skin: Secondary | ICD-10-CM | POA: Diagnosis not present

## 2019-12-28 DIAGNOSIS — I872 Venous insufficiency (chronic) (peripheral): Secondary | ICD-10-CM | POA: Diagnosis not present

## 2019-12-28 DIAGNOSIS — E785 Hyperlipidemia, unspecified: Secondary | ICD-10-CM | POA: Diagnosis not present

## 2019-12-28 DIAGNOSIS — L97929 Non-pressure chronic ulcer of unspecified part of left lower leg with unspecified severity: Secondary | ICD-10-CM | POA: Diagnosis not present

## 2019-12-28 DIAGNOSIS — Z8711 Personal history of peptic ulcer disease: Secondary | ICD-10-CM | POA: Diagnosis not present

## 2019-12-28 DIAGNOSIS — J449 Chronic obstructive pulmonary disease, unspecified: Secondary | ICD-10-CM | POA: Diagnosis not present

## 2019-12-28 DIAGNOSIS — S72141D Displaced intertrochanteric fracture of right femur, subsequent encounter for closed fracture with routine healing: Secondary | ICD-10-CM | POA: Diagnosis not present

## 2019-12-28 DIAGNOSIS — D509 Iron deficiency anemia, unspecified: Secondary | ICD-10-CM | POA: Diagnosis not present

## 2019-12-28 DIAGNOSIS — G4733 Obstructive sleep apnea (adult) (pediatric): Secondary | ICD-10-CM | POA: Diagnosis not present

## 2019-12-29 DIAGNOSIS — F1111 Opioid abuse, in remission: Secondary | ICD-10-CM | POA: Diagnosis not present

## 2020-01-01 DIAGNOSIS — F1111 Opioid abuse, in remission: Secondary | ICD-10-CM | POA: Diagnosis not present

## 2020-01-03 ENCOUNTER — Other Ambulatory Visit: Payer: Self-pay

## 2020-01-03 DIAGNOSIS — I83813 Varicose veins of bilateral lower extremities with pain: Secondary | ICD-10-CM

## 2020-01-03 DIAGNOSIS — F1111 Opioid abuse, in remission: Secondary | ICD-10-CM | POA: Diagnosis not present

## 2020-01-04 DIAGNOSIS — I898 Other specified noninfective disorders of lymphatic vessels and lymph nodes: Secondary | ICD-10-CM | POA: Diagnosis not present

## 2020-01-04 DIAGNOSIS — R600 Localized edema: Secondary | ICD-10-CM | POA: Diagnosis not present

## 2020-01-04 DIAGNOSIS — R269 Unspecified abnormalities of gait and mobility: Secondary | ICD-10-CM | POA: Diagnosis not present

## 2020-01-04 DIAGNOSIS — Z6830 Body mass index (BMI) 30.0-30.9, adult: Secondary | ICD-10-CM | POA: Diagnosis not present

## 2020-01-05 DIAGNOSIS — F1111 Opioid abuse, in remission: Secondary | ICD-10-CM | POA: Diagnosis not present

## 2020-01-09 DIAGNOSIS — G8929 Other chronic pain: Secondary | ICD-10-CM | POA: Diagnosis not present

## 2020-01-09 DIAGNOSIS — Z6830 Body mass index (BMI) 30.0-30.9, adult: Secondary | ICD-10-CM | POA: Diagnosis not present

## 2020-01-09 DIAGNOSIS — I898 Other specified noninfective disorders of lymphatic vessels and lymph nodes: Secondary | ICD-10-CM | POA: Diagnosis not present

## 2020-01-09 DIAGNOSIS — Z9119 Patient's noncompliance with other medical treatment and regimen: Secondary | ICD-10-CM | POA: Diagnosis not present

## 2020-01-10 DIAGNOSIS — L02214 Cutaneous abscess of groin: Secondary | ICD-10-CM | POA: Diagnosis not present

## 2020-01-12 DIAGNOSIS — F1111 Opioid abuse, in remission: Secondary | ICD-10-CM | POA: Diagnosis not present

## 2020-01-12 DIAGNOSIS — I872 Venous insufficiency (chronic) (peripheral): Secondary | ICD-10-CM | POA: Diagnosis not present

## 2020-01-17 ENCOUNTER — Ambulatory Visit (HOSPITAL_COMMUNITY)
Admission: RE | Admit: 2020-01-17 | Discharge: 2020-01-17 | Disposition: A | Discharge status: Home / Self Care | Payer: PPO | Source: Ambulatory Visit | Attending: Vascular Surgery | Admitting: Vascular Surgery

## 2020-01-17 ENCOUNTER — Other Ambulatory Visit: Payer: Self-pay

## 2020-01-17 ENCOUNTER — Ambulatory Visit: Payer: PPO | Admitting: Vascular Surgery

## 2020-01-17 ENCOUNTER — Encounter: Payer: Self-pay | Admitting: Vascular Surgery

## 2020-01-17 VITALS — BP 102/65 | HR 67 | Temp 97.7°F | Resp 18 | Ht 71.0 in | Wt 215.0 lb

## 2020-01-17 DIAGNOSIS — I83813 Varicose veins of bilateral lower extremities with pain: Secondary | ICD-10-CM | POA: Diagnosis not present

## 2020-01-17 NOTE — Progress Notes (Signed)
Patient is a 59 year old male with longstanding history of varicose veins and probably a component of lymphedema. He was last seen in October 2020. We were considering a laser ablation but he canceled the procedure as he got anxious about it. He has had episodes of bleeding from varicose veins in 2019. He has had multiple exacerbation in remission of ulcerations in both legs and is cared for at the wound center in [place] frequently. He currently has superficial ulcerations over both lower extremities and is being treated with compression dressings. He states that they are getting smaller. He did not want me to take the dressings off today since they had just been put on this morning. He recently had a lymph node biopsy in the right groin. This was found to be benign. He states the right leg swelling got worse after this. He currently has an open wound in his right groin at the site of the previous lymph node biopsy. This is being cared for by general surgeon in [place]. He has no history of DVT. He does not have any history of varicose veins in his family. He did have a gastric bypass in 2017 and lost 260 pounds. He has had a bleeding varicose vein in the left leg several years ago as well. He is on methadone for previous heroin habit. He has complaints of numbness and tingling in both of his feet for the last several years. Prior images of his lower extremities were taken in August 2019 and appear my note in his chart.  Current Outpatient Medications on File Prior to Visit  Medication Sig Dispense Refill   alendronate (FOSAMAX) 70 MG tablet Take 70 mg by mouth once a week.     aspirin 325 MG tablet Take 325 mg by mouth daily.     methadone (METHADOSE) 40 MG disintegrating tablet Take 195 mg by mouth daily.      potassium chloride (KLOR-CON) 10 MEQ tablet Take 1 tablet (10 mEq total) by mouth daily. 90 tablet 1   rosuvastatin (CRESTOR) 5 MG tablet Take 1 tablet by mouth daily.     torsemide  (DEMADEX) 20 MG tablet Take 1 tablet (20 mg total) by mouth 2 (two) times daily. 60 tablet 1   No current facility-administered medications on file prior to visit.    No Known Allergies  Review of systems: He has no shortness of breath. He has no chest pain. He has difficulty walking due to degenerative arthritis in his lower extremity edema.  Physical exam:  Vitals:   01/17/20 1436  BP: 102/65  Pulse: 67  Resp: 18  Temp: 97.7 F (36.5 C)  TempSrc: Temporal  SpO2: 99%  Weight: 215 lb (97.5 kg)  Height: 5\' 11"  (1.803 m)    Extremities: Diffusely edematous from the knee to the foot. Compression dressings in place.  Patient has an open wound in his right groin about 2 cm in width 2 cm in depth no purulent drainage no surrounding erythema  Data: Patient had a venous reflux exam of his right lower extremity today. I reviewed and interpreted the study. This showed reflux in the deep system in the femoral popliteal vein. He also had reflux within the right greater saphenous vein with a vein diameter of 4-1/2 to 5 mm. Patient also had a previous reflux exam August 2019. Reviewed the study today. This showed diffuse reflux in the right leg with a greater saphenous vein diameter of 4 mm in the mid thigh up to 1 cm  at the saphenofemoral junction and proximal greater saphenous vein. Left greater saphenous was about 4 mm in diameter.  Assessment: Chronic lower extremity edema multifactorial lymphedema combined with deep and superficial venous reflux. Patient has had multiple exacerbation in remission of recurrent ulcers as well as intermittent bleeding from varicose veins. At this point he thinks he is willing to undergo laser ablation.  Plan: I discussed the patient today that we would need to have his right groin wound healed before we consider laser ablation. He will follow up with [place] in 1 month's time. If the right groin wound is healed at that point we will further evaluate the skin in his  lower extremities and if this seems to be improving we would consider laser ablation in the right greater saphenous vein. I did discuss with the patient today that he has multifactorial reasons for his swelling and laser ablation would improve and decrease risk of recurrent infection and swelling but probably not alleviate all of his symptoms. Also discussed with him today the risk benefits possible complications of procedure details of laser ablation including not limited to bleeding infection deep vein thrombosis nerve injury with overall low risk of these.  Ruta Hinds, MD [facility] Office: [phone]

## 2020-01-18 DIAGNOSIS — S72143A Displaced intertrochanteric fracture of unspecified femur, initial encounter for closed fracture: Secondary | ICD-10-CM | POA: Diagnosis not present

## 2020-01-18 DIAGNOSIS — M25551 Pain in right hip: Secondary | ICD-10-CM | POA: Diagnosis not present

## 2020-01-27 DIAGNOSIS — L97929 Non-pressure chronic ulcer of unspecified part of left lower leg with unspecified severity: Secondary | ICD-10-CM | POA: Diagnosis not present

## 2020-01-27 DIAGNOSIS — Z720 Tobacco use: Secondary | ICD-10-CM | POA: Diagnosis not present

## 2020-01-27 DIAGNOSIS — L97311 Non-pressure chronic ulcer of right ankle limited to breakdown of skin: Secondary | ICD-10-CM | POA: Diagnosis not present

## 2020-01-27 DIAGNOSIS — D509 Iron deficiency anemia, unspecified: Secondary | ICD-10-CM | POA: Diagnosis not present

## 2020-01-27 DIAGNOSIS — J449 Chronic obstructive pulmonary disease, unspecified: Secondary | ICD-10-CM | POA: Diagnosis not present

## 2020-01-27 DIAGNOSIS — Z8711 Personal history of peptic ulcer disease: Secondary | ICD-10-CM | POA: Diagnosis not present

## 2020-01-27 DIAGNOSIS — L97312 Non-pressure chronic ulcer of right ankle with fat layer exposed: Secondary | ICD-10-CM | POA: Diagnosis not present

## 2020-01-27 DIAGNOSIS — L97811 Non-pressure chronic ulcer of other part of right lower leg limited to breakdown of skin: Secondary | ICD-10-CM | POA: Diagnosis not present

## 2020-01-27 DIAGNOSIS — I872 Venous insufficiency (chronic) (peripheral): Secondary | ICD-10-CM | POA: Diagnosis not present

## 2020-01-27 DIAGNOSIS — L03115 Cellulitis of right lower limb: Secondary | ICD-10-CM | POA: Diagnosis not present

## 2020-01-27 DIAGNOSIS — Z9181 History of falling: Secondary | ICD-10-CM | POA: Diagnosis not present

## 2020-01-27 DIAGNOSIS — G4733 Obstructive sleep apnea (adult) (pediatric): Secondary | ICD-10-CM | POA: Diagnosis not present

## 2020-01-27 DIAGNOSIS — Z7982 Long term (current) use of aspirin: Secondary | ICD-10-CM | POA: Diagnosis not present

## 2020-01-27 DIAGNOSIS — Z79891 Long term (current) use of opiate analgesic: Secondary | ICD-10-CM | POA: Diagnosis not present

## 2020-01-27 DIAGNOSIS — E785 Hyperlipidemia, unspecified: Secondary | ICD-10-CM | POA: Diagnosis not present

## 2020-01-27 DIAGNOSIS — S72141D Displaced intertrochanteric fracture of right femur, subsequent encounter for closed fracture with routine healing: Secondary | ICD-10-CM | POA: Diagnosis not present

## 2020-01-27 DIAGNOSIS — F1921 Other psychoactive substance dependence, in remission: Secondary | ICD-10-CM | POA: Diagnosis not present

## 2020-01-30 DIAGNOSIS — F1111 Opioid abuse, in remission: Secondary | ICD-10-CM | POA: Diagnosis not present

## 2020-02-14 DIAGNOSIS — F1111 Opioid abuse, in remission: Secondary | ICD-10-CM | POA: Diagnosis not present

## 2020-02-27 ENCOUNTER — Ambulatory Visit: Payer: PPO | Admitting: Cardiology

## 2020-02-27 DIAGNOSIS — F1111 Opioid abuse, in remission: Secondary | ICD-10-CM | POA: Diagnosis not present

## 2020-02-28 ENCOUNTER — Ambulatory Visit: Payer: PPO | Admitting: Vascular Surgery

## 2020-03-12 DIAGNOSIS — F1111 Opioid abuse, in remission: Secondary | ICD-10-CM | POA: Diagnosis not present

## 2020-03-21 ENCOUNTER — Encounter: Payer: Self-pay | Admitting: Cardiology

## 2020-03-27 DIAGNOSIS — F1111 Opioid abuse, in remission: Secondary | ICD-10-CM | POA: Diagnosis not present

## 2020-04-01 ENCOUNTER — Encounter (HOSPITAL_BASED_OUTPATIENT_CLINIC_OR_DEPARTMENT_OTHER): Payer: PPO | Admitting: Internal Medicine

## 2020-04-08 DIAGNOSIS — F1111 Opioid abuse, in remission: Secondary | ICD-10-CM | POA: Diagnosis not present

## 2020-04-11 DIAGNOSIS — I2510 Atherosclerotic heart disease of native coronary artery without angina pectoris: Secondary | ICD-10-CM | POA: Diagnosis not present

## 2020-04-11 DIAGNOSIS — D519 Vitamin B12 deficiency anemia, unspecified: Secondary | ICD-10-CM | POA: Diagnosis not present

## 2020-04-23 DIAGNOSIS — I890 Lymphedema, not elsewhere classified: Secondary | ICD-10-CM | POA: Diagnosis not present

## 2020-04-23 DIAGNOSIS — L03119 Cellulitis of unspecified part of limb: Secondary | ICD-10-CM | POA: Diagnosis not present

## 2020-04-23 DIAGNOSIS — F1111 Opioid abuse, in remission: Secondary | ICD-10-CM | POA: Diagnosis not present

## 2020-04-23 DIAGNOSIS — Z6831 Body mass index (BMI) 31.0-31.9, adult: Secondary | ICD-10-CM | POA: Diagnosis not present

## 2020-05-02 DIAGNOSIS — L97929 Non-pressure chronic ulcer of unspecified part of left lower leg with unspecified severity: Secondary | ICD-10-CM | POA: Diagnosis not present

## 2020-05-02 DIAGNOSIS — I83029 Varicose veins of left lower extremity with ulcer of unspecified site: Secondary | ICD-10-CM | POA: Diagnosis not present

## 2020-05-02 DIAGNOSIS — R609 Edema, unspecified: Secondary | ICD-10-CM | POA: Diagnosis not present

## 2020-05-02 DIAGNOSIS — I83892 Varicose veins of left lower extremities with other complications: Secondary | ICD-10-CM | POA: Diagnosis not present

## 2020-05-07 DIAGNOSIS — I83892 Varicose veins of left lower extremities with other complications: Secondary | ICD-10-CM | POA: Diagnosis not present

## 2020-05-07 DIAGNOSIS — F1111 Opioid abuse, in remission: Secondary | ICD-10-CM | POA: Diagnosis not present

## 2020-05-07 DIAGNOSIS — L97929 Non-pressure chronic ulcer of unspecified part of left lower leg with unspecified severity: Secondary | ICD-10-CM | POA: Diagnosis not present

## 2020-05-07 DIAGNOSIS — I83029 Varicose veins of left lower extremity with ulcer of unspecified site: Secondary | ICD-10-CM | POA: Diagnosis not present

## 2020-05-07 DIAGNOSIS — R609 Edema, unspecified: Secondary | ICD-10-CM | POA: Diagnosis not present

## 2020-05-14 DIAGNOSIS — R609 Edema, unspecified: Secondary | ICD-10-CM | POA: Diagnosis not present

## 2020-05-14 DIAGNOSIS — I83892 Varicose veins of left lower extremities with other complications: Secondary | ICD-10-CM | POA: Diagnosis not present

## 2020-05-14 DIAGNOSIS — L97929 Non-pressure chronic ulcer of unspecified part of left lower leg with unspecified severity: Secondary | ICD-10-CM | POA: Diagnosis not present

## 2020-05-14 DIAGNOSIS — I83029 Varicose veins of left lower extremity with ulcer of unspecified site: Secondary | ICD-10-CM | POA: Diagnosis not present

## 2020-05-20 ENCOUNTER — Encounter (HOSPITAL_BASED_OUTPATIENT_CLINIC_OR_DEPARTMENT_OTHER): Payer: PPO | Attending: Internal Medicine | Admitting: Internal Medicine

## 2020-05-20 ENCOUNTER — Other Ambulatory Visit: Payer: Self-pay

## 2020-05-20 ENCOUNTER — Encounter (HOSPITAL_BASED_OUTPATIENT_CLINIC_OR_DEPARTMENT_OTHER): Payer: PPO | Admitting: Internal Medicine

## 2020-05-20 DIAGNOSIS — F17200 Nicotine dependence, unspecified, uncomplicated: Secondary | ICD-10-CM | POA: Diagnosis not present

## 2020-05-20 DIAGNOSIS — L97229 Non-pressure chronic ulcer of left calf with unspecified severity: Secondary | ICD-10-CM | POA: Diagnosis not present

## 2020-05-20 DIAGNOSIS — I872 Venous insufficiency (chronic) (peripheral): Secondary | ICD-10-CM | POA: Insufficient documentation

## 2020-05-20 DIAGNOSIS — I509 Heart failure, unspecified: Secondary | ICD-10-CM | POA: Diagnosis not present

## 2020-05-20 DIAGNOSIS — L97821 Non-pressure chronic ulcer of other part of left lower leg limited to breakdown of skin: Secondary | ICD-10-CM | POA: Insufficient documentation

## 2020-05-20 DIAGNOSIS — I87333 Chronic venous hypertension (idiopathic) with ulcer and inflammation of bilateral lower extremity: Secondary | ICD-10-CM | POA: Diagnosis not present

## 2020-05-20 DIAGNOSIS — I890 Lymphedema, not elsewhere classified: Secondary | ICD-10-CM | POA: Insufficient documentation

## 2020-05-20 DIAGNOSIS — L97811 Non-pressure chronic ulcer of other part of right lower leg limited to breakdown of skin: Secondary | ICD-10-CM | POA: Diagnosis not present

## 2020-05-20 DIAGNOSIS — Z8249 Family history of ischemic heart disease and other diseases of the circulatory system: Secondary | ICD-10-CM | POA: Insufficient documentation

## 2020-05-20 DIAGNOSIS — F1111 Opioid abuse, in remission: Secondary | ICD-10-CM | POA: Diagnosis not present

## 2020-05-21 NOTE — Progress Notes (Signed)
ITHAN, TOUHEY ([id number]) Visit Report for 05/20/2020 Allergy List Details Patient Name: Date of Service: [name], [place] J. 05/20/2020 10:00 A M Medical Record Number: [id number] Patient Account Number: [id number] Date of Birth/Sex: Treating RN: 01-31-1961 (60 y.o. Male) Rhae Hammock Primary Care Khadijatou Borak: [name] Other Clinician: Referring Lisandra Mathisen: Treating Iylah Dworkin/Extender: Lyda Perone, JO HN Weeks in Treatment: 0 Allergies Active Allergies No Known Allergies Type: Allergen Allergy Notes Electronic Signature(s) Signed: 05/21/2020 5:05:22 PM By: Rhae Hammock RN Entered By: Rhae Hammock on 05/20/2020 10:19:10 -------------------------------------------------------------------------------- Arrival Information Details Patient Name: Date of Service: Marvin Atlas, PA UL J. 05/20/2020 10:00 A M Medical Record Number: [id number] Patient Account Number: [id number] Date of Birth/Sex: Treating RN: [birth year] (60 y.o. Male) Levan Hurst Primary Care Mindi Akerson: [name] Other Clinician: Referring Lakea Mittelman: Treating Louan Base/Extender: Talitha Givens HN Weeks in Treatment: 0 Visit Information Patient Arrived: Ambulatory Arrival Time: 09:57 Accompanied By: self Transfer Assistance: None Patient Identification Verified: Yes Secondary Verification Process Completed: Yes Patient Requires Transmission-Based Precautions: No Patient Has Alerts: Yes Patient Alerts: ABI's R: N/C Electronic Signature(s) Signed: 05/21/2020 5:05:22 PM By: Rhae Hammock RN Entered By: Rhae Hammock on 05/20/2020 10:52:22 -------------------------------------------------------------------------------- Clinic Level of Care Assessment Details Patient Name: Date of Service: Marvin Atlas, PA UL J. 05/20/2020 10:00 A M Medical Record Number: [id number] Patient Account Number: [id number] Date of Birth/Sex: Treating RN: 16-May-1960 (60 y.o. Male) Levan Hurst Primary  Care Khadeja Abt: [name] Other Clinician: Referring Aleyna Cueva: Treating Gurkirat Basher/Extender: Lyda Perone, JO HN Weeks in Treatment: 0 Clinic Level of Care Assessment Items TOOL 1 Quantity Score X- 1 0 Use when EandM and Procedure is performed on INITIAL visit ASSESSMENTS - Nursing Assessment / Reassessment X- 1 20 General Physical Exam (combine w/ comprehensive assessment (listed just below) when performed on new pt. evals) X- 1 25 Comprehensive Assessment (HX, ROS, Risk Assessments, Wounds Hx, etc.) ASSESSMENTS - Wound and Skin Assessment / Reassessment []  - 0 Dermatologic / Skin Assessment (not related to wound area) ASSESSMENTS - Ostomy and/or Continence Assessment and Care []  - 0 Incontinence Assessment and Management []  - 0 Ostomy Care Assessment and Management (repouching, etc.) PROCESS - Coordination of Care X - Simple Patient / Family Education for ongoing care 1 15 []  - 0 Complex (extensive) Patient / Family Education for ongoing care X- 1 10 Staff obtains Programmer, systems, Records, T Results / Process Orders est []  - 0 Staff telephones HHA, Nursing Homes / Clarify orders / etc []  - 0 Routine Transfer to another Facility (non-emergent condition) []  - 0 Routine Hospital Admission (non-emergent condition) X- 1 15 New Admissions / Biomedical engineer / Ordering NPWT Apligraf, etc. , []  - 0 Emergency Hospital Admission (emergent condition) PROCESS - Special Needs []  - 0 Pediatric / Minor Patient Management []  - 0 Isolation Patient Management []  - 0 Hearing / Language / Visual special needs []  - 0 Assessment of Community assistance (transportation, D/C planning, etc.) []  - 0 Additional assistance / Altered mentation []  - 0 Support Surface(s) Assessment (bed, cushion, seat, etc.) INTERVENTIONS - Miscellaneous []  - 0 External ear exam []  - 0 Patient Transfer (multiple staff / Civil Service fast streamer / Similar devices) []  - 0 Simple Staple / Suture  removal (25 or less) []  - 0 Complex Staple / Suture removal (26 or more) []  - 0 Hypo/Hyperglycemic Management (do not check if billed separately) X- 1 15 Ankle / Brachial Index (ABI) - do not check if billed separately  Has the patient been seen at the hospital within the last three years: Yes Total Score: 100 Level Of Care: New/Established - Level 3 Electronic Signature(s) Signed: 05/20/2020 4:14:28 PM By: Levan Hurst RN, BSN Entered By: Levan Hurst on 05/20/2020 11:34:40 -------------------------------------------------------------------------------- Compression Therapy Details Patient Name: Date of Service: Marvin Atlas, PA UL J. 05/20/2020 10:00 A M Medical Record Number: [id number] Patient Account Number: [id number] Date of Birth/Sex: Treating RN: 1960-11-19 (60 y.o. Male) Levan Hurst Primary Care Rielyn Krupinski: [name] Other Clinician: Referring Breeana Sawtelle: Treating Nichalas Coin/Extender: Lyda Perone, JO HN Weeks in Treatment: 0 Compression Therapy Performed for Wound Assessment: Wound #1 Right,Lateral Lower Leg Performed By: Clinician Levan Hurst, RN Compression Type: Four Layer Post Procedure Diagnosis Same as Pre-procedure Electronic Signature(s) Signed: 05/20/2020 4:14:28 PM By: Levan Hurst RN, BSN Entered By: Levan Hurst on 05/20/2020 11:10:54 -------------------------------------------------------------------------------- Compression Therapy Details Patient Name: Date of Service: Marvin Atlas, PA UL J. 05/20/2020 10:00 A M Medical Record Number: [id number] Patient Account Number: [id number] Date of Birth/Sex: Treating RN: 1960-10-14 (59 y.o. Male) Levan Hurst Primary Care Eland Lamantia: [name] Other Clinician: Referring Chade Pitner: Treating Marlissa Emerick/Extender: Talitha Givens HN Weeks in Treatment: 0 Compression Therapy Performed for Wound Assessment: Wound #2 Left,Anterior Lower Leg Performed By: Clinician Levan Hurst,  RN Compression Type: Four Layer Post Procedure Diagnosis Same as Pre-procedure Electronic Signature(s) Signed: 05/20/2020 4:14:28 PM By: Levan Hurst RN, BSN Entered By: Levan Hurst on 05/20/2020 11:10:57 -------------------------------------------------------------------------------- Compression Therapy Details Patient Name: Date of Service: Marvin Atlas, PA UL J. 05/20/2020 10:00 A M Medical Record Number: [id number] Patient Account Number: [id number] Date of Birth/Sex: Treating RN: 1960-05-07 (59 y.o. Male) Levan Hurst Primary Care Honestee Revard: [name] Other Clinician: Referring Olayinka Gathers: Treating Carin Shipp/Extender: Lyda Perone, JO HN Weeks in Treatment: 0 Compression Therapy Performed for Wound Assessment: Wound #3 Left,Posterior Lower Leg Performed By: Clinician Levan Hurst, RN Compression Type: Four Layer Post Procedure Diagnosis Same as Pre-procedure Electronic Signature(s) Signed: 05/20/2020 4:14:28 PM By: Levan Hurst RN, BSN Entered By: Levan Hurst on 05/20/2020 11:10:59 -------------------------------------------------------------------------------- Encounter Discharge Information Details Patient Name: Date of Service: Marvin Atlas, PA UL J. 05/20/2020 10:00 A M Medical Record Number: [id number] Patient Account Number: [id number] Date of Birth/Sex: Treating RN: [birth year] (59 y.o. Male) Rhae Hammock Primary Care Siria Calandro: [name] Other Clinician: Referring Dimple Bastyr: Treating Brennan Karam/Extender: Talitha Givens HN Weeks in Treatment: 0 Encounter Discharge Information Items Discharge Condition: Stable Ambulatory Status: Ambulatory Discharge Destination: Home Transportation: Private Auto Accompanied By: self Schedule Follow-up Appointment: Yes Clinical Summary of Care: Patient Declined Electronic Signature(s) Signed: 05/21/2020 5:05:22 PM By: Rhae Hammock RN Entered By: Rhae Hammock on 05/20/2020  11:27:24 -------------------------------------------------------------------------------- Lower Extremity Assessment Details Patient Name: Date of Service: Marvin Atlas, PA UL J. 05/20/2020 10:00 A M Medical Record Number: [id number] Patient Account Number: [id number] Date of Birth/Sex: Treating RN: Jul 26, 1960 (59 y.o. Male) Rhae Hammock Primary Care Tylar Merendino: [name] Other Clinician: Referring Jalah Warmuth: Treating Jaxsun Ciampi/Extender: Lyda Perone, JO HN Weeks in Treatment: 0 Edema Assessment Assessed: [Left: Yes] [Right: Yes] Edema: [Left: Yes] [Right: Yes] Calf Left: Right: Point of Measurement: From Medial Instep 40 cm 46 cm Ankle Left: Right: Point of Measurement: From Medial Instep 24 cm 24 cm Vascular Assessment Pulses: Dorsalis Pedis Palpable: [Left:Yes] [Right:Yes] Posterior Tibial Palpable: [Right:Yes] Blood Pressure: Brachial: [Right:108] Ankle: [Right:Dorsalis Pedis: 164 1.52] Electronic Signature(s) Signed: 05/21/2020 5:05:22 PM By: Rhae Hammock RN Entered By: Rhae Hammock  on 05/20/2020 10:51:57 -------------------------------------------------------------------------------- Multi Wound Chart Details Patient Name: Date of Service: [name], [name] 05/20/2020 10:00 A M Medical Record Number: [id number] Patient Account Number: [id number] Date of Birth/Sex: Treating RN: 06-Jun-1960 (60 y.o. Male) Levan Hurst Primary Care Raeli Wiens: [name] Other Clinician: Referring Jolean Madariaga: Treating Adetokunbo Mccadden/Extender: Lyda Perone, JO HN Weeks in Treatment: 0 Vital Signs Height(in): 71 Pulse(bpm): 65 Weight(lbs): 210 Blood Pressure(mmHg): 108/70 Body Mass Index(BMI): 29 Temperature(F): 97.8 Respiratory Rate(breaths/min): 18 Photos: [1:No Photos Right, Lateral Lower Leg] [2:No Photos Left, Anterior Lower Leg] [3:No Photos Left, Posterior Lower Leg] Wound Location: [1:Gradually Appeared] [2:Gradually Appeared]  [3:Gradually Appeared] Wounding Event: [1:Venous Leg Ulcer] [2:Venous Leg Ulcer] [3:Venous Leg Ulcer] Primary Etiology: [1:Anemia, Sleep Apnea, Congestive] [2:Anemia, Sleep Apnea, Congestive] [3:Anemia, Sleep Apnea, Congestive] Comorbid History: [1:Heart Failure, Coronary Artery Disease, Osteoarthritis 05/15/2019] [2:Heart Failure, Coronary Artery Disease, Osteoarthritis 05/15/2019] [3:Heart Failure, Coronary Artery Disease, Osteoarthritis 05/15/2019] Date Acquired: [1:0] [2:0] [3:0] Weeks of Treatment: [1:Open] [2:Open] [3:Open] Wound Status: [1:2.9x4.8x0.1] [2:1x2.5x0.1] [3:11.8x6x0.1] Measurements L x W x D (cm) [1:10.933] [2:1.963] [3:55.606] A (cm) : rea [1:1.093] [2:0.196] [3:5.561] Volume (cm) : [1:Full Thickness Without Exposed] [2:Full Thickness Without Exposed] [3:Full Thickness Without Exposed] Classification: [1:Support Structures Medium] [2:Support Structures Medium] [3:Support Structures Medium] Exudate Amount: [1:Serosanguineous] [2:Serosanguineous] [3:Serosanguineous] Exudate Type: [1:red, brown] [2:red, brown] [3:red, brown] Exudate Color: [1:Distinct, outline attached] [2:Distinct, outline attached] [3:Distinct, outline attached] Wound Margin: [1:Medium (34-66%)] [2:Medium (34-66%)] [3:Medium (34-66%)] Granulation Amount: [1:Red, Pink] [2:Red, Pink] [3:Red, Pink] Granulation Quality: [1:Medium (34-66%)] [2:Medium (34-66%)] [3:Medium (34-66%)] Necrotic Amount: [1:Fascia: No] [2:Fascia: No] [3:Fascia: No] Exposed Structures: [1:Fat Layer (Subcutaneous Tissue): No Tendon: No Muscle: No Joint: No Bone: No Small (1-33%)] [2:Fat Layer (Subcutaneous Tissue): No Tendon: No Muscle: No Joint: No Bone: No Small (1-33%)] [3:Fat Layer (Subcutaneous Tissue): No Tendon: No Muscle: No  Joint: No Bone: No Small (1-33%)] Epithelialization: [1:Compression Therapy] [2:Compression Therapy] [3:Compression Therapy] Treatment Notes Wound #1 (Lower Leg) Wound Laterality: Right,  Lateral Cleanser Soap and Water Discharge Instruction: May shower and wash wound with dial antibacterial soap and water prior to dressing change. Peri-Wound Care Sween Lotion (Moisturizing lotion) Discharge Instruction: Apply moisturizing lotion as directed Topical Primary Dressing KerraCel Ag Gelling Fiber Dressing, 4x5 in (silver alginate) Discharge Instruction: Apply silver alginate to wound bed as instructed Secondary Dressing ABD Pad, 8x10 Discharge Instruction: Apply over primary dressing as directed. Zetuvit Plus 4x8 in Discharge Instruction: Apply over primary dressing as directed. Secured With Compression Wrap FourPress (4 layer compression wrap) Discharge Instruction: Apply four layer compression as directed. Unna boot below knee to help secure wrap in place Compression Stockings Add-Ons Wound #2 (Lower Leg) Wound Laterality: Left, Anterior Cleanser Soap and Water Discharge Instruction: May shower and wash wound with dial antibacterial soap and water prior to dressing change. Peri-Wound Care Sween Lotion (Moisturizing lotion) Discharge Instruction: Apply moisturizing lotion as directed Topical Primary Dressing KerraCel Ag Gelling Fiber Dressing, 4x5 in (silver alginate) Discharge Instruction: Apply silver alginate to wound bed as instructed Secondary Dressing ABD Pad, 8x10 Discharge Instruction: Apply over primary dressing as directed. Zetuvit Plus 4x8 in Discharge Instruction: Apply over primary dressing as directed. Secured With Compression Wrap FourPress (4 layer compression wrap) Discharge Instruction: Apply four layer compression as directed. Unna boot below knee to help secure wrap in place Compression Stockings Add-Ons Wound #3 (Lower Leg) Wound Laterality: Left, Posterior Cleanser Soap and Water Discharge Instruction: May shower and wash wound with dial antibacterial soap and water prior to dressing change. Peri-Wound  Care Sween Lotion  (Moisturizing lotion) Discharge Instruction: Apply moisturizing lotion as directed Topical Primary Dressing KerraCel Ag Gelling Fiber Dressing, 4x5 in (silver alginate) Discharge Instruction: Apply silver alginate to wound bed as instructed Secondary Dressing ABD Pad, 8x10 Discharge Instruction: Apply over primary dressing as directed. Zetuvit Plus 4x8 in Discharge Instruction: Apply over primary dressing as directed. Secured With Compression Wrap FourPress (4 layer compression wrap) Discharge Instruction: Apply four layer compression as directed. Unna boot below knee to help secure wrap in place Compression Stockings Add-Ons Electronic Signature(s) Signed: 05/20/2020 4:14:28 PM By: Levan Hurst RN, BSN Signed: 05/21/2020 8:19:11 AM By: Linton Ham MD Entered By: Linton Ham on 05/20/2020 11:28:11 -------------------------------------------------------------------------------- Multi-Disciplinary Care Plan Details Patient Name: Date of Service: Marvin Atlas, PA UL J. 05/20/2020 10:00 A M Medical Record Number: [id number] Patient Account Number: [id number] Date of Birth/Sex: Treating RN: 26-Aug-1960 (60 y.o. Male) Levan Hurst Primary Care Odysseus Cada: [name] Other Clinician: Referring Briauna Gilmartin: Treating Annalyssa Thune/Extender: Talitha Givens HN Weeks in Treatment: 0 Active Inactive Abuse / Safety / Falls / Self Care Management Nursing Diagnoses: Potential for falls Potential for injury related to falls Goals: Patient will not experience any injury related to falls Date Initiated: 05/20/2020 Target Resolution Date: 06/21/2020 Goal Status: Active Patient/caregiver will verbalize/demonstrate measures taken to prevent injury and/or falls Date Initiated: 05/20/2020 Target Resolution Date: 06/21/2020 Goal Status: Active Interventions: Assess Activities of Daily Living upon admission and as needed Assess fall risk on admission and as needed Assess: immobility,  friction, shearing, incontinence upon admission and as needed Assess impairment of mobility on admission and as needed per policy Assess personal safety and home safety (as indicated) on admission and as needed Assess self care needs on admission and as needed Provide education on fall prevention Notes: Venous Leg Ulcer Nursing Diagnoses: Actual venous Insuffiency (use after diagnosis is confirmed) Knowledge deficit related to disease process and management Goals: Patient will maintain optimal edema control Date Initiated: 05/20/2020 Target Resolution Date: 06/21/2020 Goal Status: Active Patient/caregiver will verbalize understanding of disease process and disease management Date Initiated: 05/20/2020 Target Resolution Date: 06/21/2020 Goal Status: Active Interventions: Assess peripheral edema status every visit. Compression as ordered Provide education on venous insufficiency Notes: Wound/Skin Impairment Nursing Diagnoses: Impaired tissue integrity Knowledge deficit related to smoking impact on wound healing Knowledge deficit related to ulceration/compromised skin integrity Goals: Patient will demonstrate a reduced rate of smoking or cessation of smoking Date Initiated: 05/20/2020 Target Resolution Date: 06/21/2020 Goal Status: Active Patient/caregiver will verbalize understanding of skin care regimen Date Initiated: 05/20/2020 Target Resolution Date: 06/21/2020 Goal Status: Active Ulcer/skin breakdown will have a volume reduction of 30% by week 4 Date Initiated: 05/20/2020 Target Resolution Date: 06/21/2020 Goal Status: Active Interventions: Assess patient/caregiver ability to obtain necessary supplies Assess patient/caregiver ability to perform ulcer/skin care regimen upon admission and as needed Assess ulceration(s) every visit Provide education on smoking Provide education on ulcer and skin care Notes: Electronic Signature(s) Signed: 05/20/2020 4:14:28 PM By: Levan Hurst RN,  BSN Entered By: Levan Hurst on 05/20/2020 11:02:25 -------------------------------------------------------------------------------- Pain Assessment Details Patient Name: Date of Service: Marvin Atlas, PA UL J. 05/20/2020 10:00 A M Medical Record Number: [id number] Patient Account Number: [id number] Date of Birth/Sex: Treating RN: 1960/05/28 (60 y.o. Male) Rhae Hammock Primary Care Tenzin Edelman: [name] Other Clinician: Referring Blannie Shedlock: Treating Shacoria Latif/Extender: Lyda Perone, JO HN Weeks in Treatment: 0 Active Problems Location of Pain Severity and Description of Pain Patient Has Paino Yes Site Locations  With Dressing Change: Yes Duration of the Pain. Constant / Intermittento Constant Rate the pain. Current Pain Level: 5 Worst Pain Level: 10 Least Pain Level: 0 Tolerable Pain Level: 7 Character of Pain Describe the Pain: Aching Pain Management and Medication Current Pain Management: Medication: Yes Cold Application: No Rest: Yes Massage: No Activity: No T.E.N.S.: No Heat Application: No Leg drop or elevation: No Is the Current Pain Management Adequate: Adequate How does your wound impact your activities of daily livingo Sleep: No Bathing: No Appetite: No Relationship With Others: No Bladder Continence: No Emotions: No Bowel Continence: No Work: No Toileting: No Drive: No Dressing: No Hobbies: No Electronic Signature(s) Signed: 05/21/2020 5:05:22 PM By: Rhae Hammock RN Entered By: Rhae Hammock on 05/20/2020 10:29:00 -------------------------------------------------------------------------------- Patient/Caregiver Education Details Patient Name: Date of Service: Marvin Atlas, PA UL J. 2/7/2022andnbsp10:00 A M Medical Record Number: [id number] Patient Account Number: [id number] Date of Birth/Gender: Treating RN: 27-May-1960 (60 y.o. Male) Levan Hurst Primary Care Physician: [name] Other Clinician: Referring  Physician: Treating Physician/Extender: Talitha Givens HN Weeks in Treatment: 0 Education Assessment Education Provided To: Patient Education Topics Provided Safety: Methods: Explain/Verbal Responses: State content correctly Smoking and Wound Healing: Methods: Explain/Verbal Responses: State content correctly Venous: Methods: Explain/Verbal Responses: State content correctly Wound/Skin Impairment: Methods: Explain/Verbal Responses: State content correctly Electronic Signature(s) Signed: 05/20/2020 4:14:28 PM By: Levan Hurst RN, BSN Entered By: Levan Hurst on 05/20/2020 11:34:16 -------------------------------------------------------------------------------- Wound Assessment Details Patient Name: Date of Service: Marvin Atlas, PA UL J. 05/20/2020 10:00 A M Medical Record Number: [id number] Patient Account Number: [id number] Date of Birth/Sex: Treating RN: 1960-06-18 (60 y.o. Male) Rhae Hammock Primary Care Louise Rawson: [name] Other Clinician: Referring Mannie Ohlin: Treating Donatello Kleve/Extender: Lyda Perone, JO HN Weeks in Treatment: 0 Wound Status Wound Number: 1 Primary Venous Leg Ulcer Etiology: Wound Location: Right, Lateral Lower Leg Wound Open Wounding Event: Gradually Appeared Status: Date Acquired: 05/15/2019 Comorbid Anemia, Sleep Apnea, Congestive Heart Failure, Coronary Weeks Of Treatment: 0 History: Artery Disease, Osteoarthritis Clustered Wound: No Photos Photo Uploaded By: Mikeal Hawthorne on 05/21/2020 11:56:21 Wound Measurements Length: (cm) 2.9 Width: (cm) 4.8 Depth: (cm) 0.1 Area: (cm) 10.933 Volume: (cm) 1.093 % Reduction in Area: % Reduction in Volume: Epithelialization: Small (1-33%) Tunneling: No Undermining: No Wound Description Classification: Full Thickness Without Exposed Support Structures Wound Margin: Distinct, outline attached Exudate Amount: Medium Exudate Type: Serosanguineous Exudate  Color: red, brown Foul Odor After Cleansing: No Slough/Fibrino Yes Wound Bed Granulation Amount: Medium (34-66%) Exposed Structure Granulation Quality: Red, Pink Fascia Exposed: No Necrotic Amount: Medium (34-66%) Fat Layer (Subcutaneous Tissue) Exposed: No Necrotic Quality: Adherent Slough Tendon Exposed: No Muscle Exposed: No Joint Exposed: No Bone Exposed: No Treatment Notes Wound #1 (Lower Leg) Wound Laterality: Right, Lateral Cleanser Soap and Water Discharge Instruction: May shower and wash wound with dial antibacterial soap and water prior to dressing change. Peri-Wound Care Sween Lotion (Moisturizing lotion) Discharge Instruction: Apply moisturizing lotion as directed Topical Primary Dressing KerraCel Ag Gelling Fiber Dressing, 4x5 in (silver alginate) Discharge Instruction: Apply silver alginate to wound bed as instructed Secondary Dressing ABD Pad, 8x10 Discharge Instruction: Apply over primary dressing as directed. Zetuvit Plus 4x8 in Discharge Instruction: Apply over primary dressing as directed. Secured With Compression Wrap FourPress (4 layer compression wrap) Discharge Instruction: Apply four layer compression as directed. Unna boot below knee to help secure wrap in place Compression Stockings Add-Ons Electronic Signature(s) Signed: 05/21/2020 5:05:22 PM By: Rhae Hammock RN Entered By: Hollie Salk,  [name] on 05/20/2020 10:31:06 -------------------------------------------------------------------------------- Wound Assessment Details Patient Name: Date of Service: [name], [place] J. 05/20/2020 10:00 A M Medical Record Number: [id number] Patient Account Number: [id number] Date of Birth/Sex: Treating RN: May 02, 1960 (60 y.o. Male) Rhae Hammock Primary Care Patrece Tallie: [name] Other Clinician: Referring Skyleen Bentley: Treating Chaos Carlile/Extender: Lyda Perone, JO HN Weeks in Treatment: 0 Wound Status Wound Number: 2 Primary Venous Leg  Ulcer Etiology: Wound Location: Left, Anterior Lower Leg Wound Open Wounding Event: Gradually Appeared Status: Date Acquired: 05/15/2019 Comorbid Anemia, Sleep Apnea, Congestive Heart Failure, Coronary Weeks Of Treatment: 0 History: Artery Disease, Osteoarthritis Clustered Wound: No Photos Photo Uploaded By: Mikeal Hawthorne on 05/21/2020 11:56:22 Wound Measurements Length: (cm) 1 Width: (cm) 2.5 Depth: (cm) 0.1 Area: (cm) 1.963 Volume: (cm) 0.196 % Reduction in Area: % Reduction in Volume: Epithelialization: Small (1-33%) Tunneling: No Undermining: No Wound Description Classification: Full Thickness Without Exposed Support Structures Wound Margin: Distinct, outline attached Exudate Amount: Medium Exudate Type: Serosanguineous Exudate Color: red, brown Foul Odor After Cleansing: No Slough/Fibrino Yes Wound Bed Granulation Amount: Medium (34-66%) Exposed Structure Granulation Quality: Red, Pink Fascia Exposed: No Necrotic Amount: Medium (34-66%) Fat Layer (Subcutaneous Tissue) Exposed: No Necrotic Quality: Adherent Slough Tendon Exposed: No Muscle Exposed: No Joint Exposed: No Bone Exposed: No Treatment Notes Wound #2 (Lower Leg) Wound Laterality: Left, Anterior Cleanser Soap and Water Discharge Instruction: May shower and wash wound with dial antibacterial soap and water prior to dressing change. Peri-Wound Care Sween Lotion (Moisturizing lotion) Discharge Instruction: Apply moisturizing lotion as directed Topical Primary Dressing KerraCel Ag Gelling Fiber Dressing, 4x5 in (silver alginate) Discharge Instruction: Apply silver alginate to wound bed as instructed Secondary Dressing ABD Pad, 8x10 Discharge Instruction: Apply over primary dressing as directed. Zetuvit Plus 4x8 in Discharge Instruction: Apply over primary dressing as directed. Secured With Compression Wrap FourPress (4 layer compression wrap) Discharge Instruction: Apply four layer compression  as directed. Unna boot below knee to help secure wrap in place Compression Stockings Add-Ons Electronic Signature(s) Signed: 05/21/2020 5:05:22 PM By: Rhae Hammock RN Entered By: Rhae Hammock on 05/20/2020 10:32:15 -------------------------------------------------------------------------------- Wound Assessment Details Patient Name: Date of Service: Marvin Atlas, PA UL J. 05/20/2020 10:00 A M Medical Record Number: [id number] Patient Account Number: [id number] Date of Birth/Sex: Treating RN: Sep 04, 1960 (60 y.o. Male) Rhae Hammock Primary Care Tamberly Pomplun: [name] Other Clinician: Referring Lagretta Loseke: Treating Yasamin Karel/Extender: Lyda Perone, JO HN Weeks in Treatment: 0 Wound Status Wound Number: 3 Primary Venous Leg Ulcer Etiology: Wound Location: Left, Posterior Lower Leg Wound Open Wounding Event: Gradually Appeared Status: Date Acquired: 05/15/2019 Comorbid Anemia, Sleep Apnea, Congestive Heart Failure, Coronary Weeks Of Treatment: 0 History: Artery Disease, Osteoarthritis Clustered Wound: No Photos Photo Uploaded By: Mikeal Hawthorne on 05/21/2020 13:59:13 Wound Measurements Length: (cm) 11.8 Width: (cm) 6 Depth: (cm) 0.1 Area: (cm) 55.606 Volume: (cm) 5.561 % Reduction in Area: % Reduction in Volume: Epithelialization: Small (1-33%) Tunneling: No Undermining: No Wound Description Classification: Full Thickness Without Exposed Support Structures Wound Margin: Distinct, outline attached Exudate Amount: Medium Exudate Type: Serosanguineous Exudate Color: red, brown Foul Odor After Cleansing: No Slough/Fibrino Yes Wound Bed Granulation Amount: Medium (34-66%) Exposed Structure Granulation Quality: Red, Pink Fascia Exposed: No Necrotic Amount: Medium (34-66%) Fat Layer (Subcutaneous Tissue) Exposed: No Necrotic Quality: Adherent Slough Tendon Exposed: No Muscle Exposed: No Joint Exposed: No Bone Exposed: No Treatment Notes Wound  #3 (Lower Leg) Wound Laterality: Left, Posterior Cleanser Soap and Water Discharge Instruction: May shower and  wash wound with dial antibacterial soap and water prior to dressing change. Peri-Wound Care Sween Lotion (Moisturizing lotion) Discharge Instruction: Apply moisturizing lotion as directed Topical Primary Dressing KerraCel Ag Gelling Fiber Dressing, 4x5 in (silver alginate) Discharge Instruction: Apply silver alginate to wound bed as instructed Secondary Dressing ABD Pad, 8x10 Discharge Instruction: Apply over primary dressing as directed. Zetuvit Plus 4x8 in Discharge Instruction: Apply over primary dressing as directed. Secured With Compression Wrap FourPress (4 layer compression wrap) Discharge Instruction: Apply four layer compression as directed. Unna boot below knee to help secure wrap in place Compression Stockings Add-Ons Electronic Signature(s) Signed: 05/21/2020 5:05:22 PM By: Rhae Hammock RN Entered By: Rhae Hammock on 05/20/2020 10:33:32 -------------------------------------------------------------------------------- Vitals Details Patient Name: Date of Service: Marvin Atlas, PA UL J. 05/20/2020 10:00 A M Medical Record Number: [id number] Patient Account Number: [id number] Date of Birth/Sex: Treating RN: [birth year] (60 y.o. Male) Levan Hurst Primary Care Veria Stradley: [name] Other Clinician: Referring Cordale Manera: Treating Chino Sardo/Extender: Lyda Perone, JO HN Weeks in Treatment: 0 Vital Signs Time Taken: 09:59 Temperature (F): 97.8 Height (in): 71 Pulse (bpm): 65 Source: Stated Respiratory Rate (breaths/min): 18 Weight (lbs): 210 Blood Pressure (mmHg): 108/70 Source: Stated Reference Range: 80 - 120 mg / dl Body Mass Index (BMI): 29.3 Electronic Signature(s) Signed: 05/20/2020 10:55:03 AM By: Sandre Kitty Entered By: Sandre Kitty on 05/20/2020 10:00:11

## 2020-05-21 NOTE — Progress Notes (Signed)
ARVLE, GRABE ([id number]) Visit Report for 05/20/2020 Abuse/Suicide Risk Screen Details Patient Name: Date of Service: [name], [place] J. 05/20/2020 10:00 A M Medical Record Number: [id number] Patient Account Number: [id number] Date of Birth/Sex: Treating RN: August 08, 1960 (60 y.o. Male) Rhae Hammock Primary Care Kemiya Batdorf: [name] Other Clinician: Referring Trigger Frasier: Treating Chanin Frumkin/Extender: Talitha Givens HN Weeks in Treatment: 0 Abuse/Suicide Risk Screen Items Answer ABUSE RISK SCREEN: Has anyone close to you tried to hurt or harm you recentlyo No Do you feel uncomfortable with anyone in your familyo No Has anyone forced you do things that you didnt want to doo No Electronic Signature(s) Signed: 05/21/2020 5:05:22 PM By: Rhae Hammock RN Entered By: Rhae Hammock on 05/20/2020 10:25:52 -------------------------------------------------------------------------------- Activities of Daily Living Details Patient Name: Date of Service: [name], [name] 05/20/2020 10:00 A M Medical Record Number: [id number] Patient Account Number: [id number] Date of Birth/Sex: Treating RN: May 03, 1960 (60 y.o. Male) Rhae Hammock Primary Care Nashalie Sallis: [name] Other Clinician: Referring Haneen Bernales: Treating Shivan Hodes/Extender: Lyda Perone, JO HN Weeks in Treatment: 0 Activities of Daily Living Items Answer Activities of Daily Living (Please select one for each item) Drive Automobile Completely Able T Medications ake Completely Able Use T elephone Completely Able Care for Appearance Completely Able Use T oilet Completely Able Bath / Shower Completely Able Dress Self Completely Able Feed Self Completely Able Walk Completely Able Get In / Out Bed Completely Able Housework Completely Able Prepare Meals Completely [place] for Self Completely Able Electronic Signature(s) Signed: 05/21/2020 5:05:22 PM By:  Rhae Hammock RN Entered By: Rhae Hammock on 05/20/2020 10:26:21 -------------------------------------------------------------------------------- Education Screening Details Patient Name: Date of Service: Marvin Atlas, Marvin UL J. 05/20/2020 10:00 A M Medical Record Number: [id number] Patient Account Number: [id number] Date of Birth/Sex: Treating RN: January 27, 1961 (59 y.o. Male) Rhae Hammock Primary Care Gildo Crisco: [name] Other Clinician: Referring Nidia Grogan: Treating Roen Macgowan/Extender: Talitha Givens HN Weeks in Treatment: 0 Primary Learner Assessed: Patient Learning Preferences/Education Level/Primary Language Learning Preference: Explanation, Communication Board, Printed Material Highest Education Level: College or Above Preferred Language: English Cognitive Barrier Language Barrier: No Translator Needed: No Memory Deficit: No Emotional Barrier: No Cultural/Religious Beliefs Affecting Medical Care: No Physical Barrier Impaired Vision: No Impaired Hearing: No Decreased Hand dexterity: No Knowledge/Comprehension Knowledge Level: High Comprehension Level: High Ability to understand written instructions: High Ability to understand verbal instructions: High Motivation Anxiety Level: Calm Cooperation: Cooperative Education Importance: Denies Need Interest in Health Problems: Asks Questions Perception: Coherent Willingness to Engage in Self-Management High Activities: Readiness to Engage in Self-Management High Activities: Electronic Signature(s) Signed: 05/21/2020 5:05:22 PM By: Rhae Hammock RN Entered By: Rhae Hammock on 05/20/2020 10:26:57 -------------------------------------------------------------------------------- Fall Risk Assessment Details Patient Name: Date of Service: Marvin Atlas, Marvin UL J. 05/20/2020 10:00 A M Medical Record Number: [id number] Patient Account Number: [id number] Date of Birth/Sex: Treating RN: 01/16/1961 (60 y.o.  Male) Rhae Hammock Primary Care Tajuan Dufault: [name] Other Clinician: Referring Abeni Finchum: Treating Gianni Fuchs/Extender: Talitha Givens HN Weeks in Treatment: 0 Fall Risk Assessment Items Have you had 2 or more falls in the last 12 monthso 0 No Have you had any fall that resulted in injury in the last 12 monthso 0 Yes FALLS RISK SCREEN History of falling - immediate or within 3 months 0 No Secondary diagnosis (Do you have 2 or more medical diagnoseso) 0 No Ambulatory aid None/bed rest/wheelchair/nurse 0 No  Crutches/cane/walker 0 No Furniture 0 No Intravenous therapy Access/Saline/Heparin Lock 0 No Gait/Transferring Normal/ bed rest/ wheelchair 0 Yes Weak (short steps with or without shuffle, stooped but able to lift head while walking, may seek 0 No support from furniture) Impaired (short steps with shuffle, may have difficulty arising from chair, head down, impaired 0 No balance) Mental Status Oriented to own ability 0 Yes Electronic Signature(s) Signed: 05/21/2020 5:05:22 PM By: Rhae Hammock RN Entered By: Rhae Hammock on 05/20/2020 10:27:27 -------------------------------------------------------------------------------- Foot Assessment Details Patient Name: Date of Service: Marvin Atlas, Marvin UL J. 05/20/2020 10:00 A M Medical Record Number: [id number] Patient Account Number: [id number] Date of Birth/Sex: Treating RN: [birth year] (60 y.o. Male) Rhae Hammock Primary Care Gentry Pilson: [name] Other Clinician: Referring Bascom Biel: Treating Danette Weinfeld/Extender: Lyda Perone, JO HN Weeks in Treatment: 0 Foot Assessment Items Site Locations + = Sensation present, - = Sensation absent, C = Callus, U = Ulcer R = Redness, W = Warmth, M = Maceration, PU = Pre-ulcerative lesion F = Fissure, S = Swelling, D = Dryness Assessment Right: Left: Other Deformity: No No Prior Foot Ulcer: No No Prior Amputation: No No Charcot Joint: No  No Ambulatory Status: Ambulatory Without Help Gait: Steady Electronic Signature(s) Signed: 05/21/2020 5:05:22 PM By: Rhae Hammock RN Entered By: Rhae Hammock on 05/20/2020 10:40:15 -------------------------------------------------------------------------------- Nutrition Risk Screening Details Patient Name: Date of Service: Marvin Atlas, Marvin UL J. 05/20/2020 10:00 A M Medical Record Number: [id number] Patient Account Number: [id number] Date of Birth/Sex: Treating RN: [birth year] (60 y.o. Male) Rhae Hammock Primary Care Shontez Sermon: [name] Other Clinician: Referring Iana Buzan: Treating Jabari Swoveland/Extender: Lyda Perone, JO HN Weeks in Treatment: 0 Height (in): 71 Weight (lbs): 210 Body Mass Index (BMI): 29.3 Nutrition Risk Screening Items Score Screening NUTRITION RISK SCREEN: I have an illness or condition that made me change the kind and/or amount of food I eat 0 No I eat fewer than two meals per day 0 No I eat few fruits and vegetables, or milk products 0 No I have three or more drinks of beer, liquor or wine almost every day 0 No I have tooth or mouth problems that make it hard for me to eat 0 No I don't always have enough money to buy the food I need 0 No I eat alone most of the time 0 No I take three or more different prescribed or over-the-counter drugs a day 0 No Without wanting to, I have lost or gained 10 pounds in the last six months 0 No I am not always physically able to shop, cook and/or feed myself 0 No Nutrition Protocols Good Risk Protocol 0 No interventions needed Moderate Risk Protocol High Risk Proctocol Risk Level: Good Risk Score: 0 Electronic Signature(s) Signed: 05/21/2020 5:05:22 PM By: Rhae Hammock RN Entered By: Rhae Hammock on 05/20/2020 10:27:34

## 2020-05-21 NOTE — Progress Notes (Signed)
LYNARD, POSTLEWAIT ([id number]) Visit Report for 05/20/2020 Chief Complaint Document Details Patient Name: Date of Service: [name], [place] J. 05/20/2020 10:00 A M Medical Record Number: [id number] Patient Account Number: [id number] Date of Birth/Sex: Treating RN: 09-07-1960 (60 y.o. Male) Levan Hurst Primary Care Provider: [name] Other Clinician: Referring Provider: Treating Provider/Extender: Talitha Givens HN Weeks in Treatment: 0 Information Obtained from: Patient Chief Complaint 05/20/2020; patient is here for review of wounds on his bilateral lower legs in the setting of severe chronic venous insufficiency with lymphedema Electronic Signature(s) Signed: 05/21/2020 8:19:11 AM By: Linton Ham MD Entered By: Linton Ham on 05/20/2020 11:28:48 -------------------------------------------------------------------------------- HPI Details Patient Name: Date of Service: Marvin Atlas, PA UL J. 05/20/2020 10:00 A M Medical Record Number: [id number] Patient Account Number: [id number] Date of Birth/Sex: Treating RN: 1960-07-14 (60 y.o. Male) Levan Hurst Primary Care Provider: [name] Other Clinician: Referring Provider: Treating Provider/Extender: Talitha Givens HN Weeks in Treatment: 0 History of Present Illness HPI Description: ADMISSION 05/20/2020 This is a 60 year old man who is not a diabetic however he has a long history of chronic venous insufficiency with wounds on his lower extremities. He went to the wound care center in [place] for a long period of time with recurrent wounds in these areas treated with compression wraps. He comes in today with wounds on his bilateral lower legs that he says have been there about a year on the right and about 6 months on the left. He went to see Dr. Oneida Alar on 01/17/2020 who noted that he had no history of DVT but does have a history of venous insufficiency wounds. They noted that he was considering laser  ablation in October 2020 but he canceled the procedure. He had an episode of bleeding from varicose veins in 2019. He had repeated venous reflux study on 10/6 which showed no evidence of a DVT or SVT Noted that he had venous reflux in the right greater saphenous vein in the thigh and the calf on the right. Also . noted to have venous reflux in the right femoral vein right popliteal vein and right small saphenous vein. At the time in October they wanted the wounds to heal a bit he also also had a right groin wound which is apparently closed. Not sure about the follow-up with vein and vascular. He comes in today with some light compression wraps and collagen on the wound placed by Dr. Lilia Pro who is a Education officer, environmental in [place]. He has 2 areas on the left leg 1 anteriorly and a large area posteriorly and a smaller area on the right anterior lower leg. He has marked lower extremity edema which is nonpitting. Hemosiderin deposition on both lower legs extensively. There is dilated venules in his feet. Past medical history includes history of opioid abuse in remission, left nephrolithiasis, history of an ablation although I did not see this in Dr. Oneida Alar notes, congestive heart failure and anemia ABIs in our clinic were noncompressible bilaterally Electronic Signature(s) Signed: 05/21/2020 8:19:11 AM By: Linton Ham MD Entered By: Linton Ham on 05/20/2020 11:33:51 -------------------------------------------------------------------------------- Physical Exam Details Patient Name: Date of Service: Marvin Atlas, PA UL J. 05/20/2020 10:00 A M Medical Record Number: [id number] Patient Account Number: [id number] Date of Birth/Sex: Treating RN: [birth year] (60 y.o. Male) Levan Hurst Primary Care Provider: [name] Other Clinician: Referring Provider: Treating Provider/Extender: Lyda Perone, JO HN Weeks in Treatment: 0 Constitutional Sitting or standing  Blood Pressure is within  target range for patient.. Pulse regular and within target range for patient.Marland Kitchen Respirations regular, non-labored and within target range.. Temperature is normal and within the target range for the patient.Marland Kitchen Appears in no distress. Cardiovascular Pulses are robust at both the dorsalis pedis and posterior tibial arteries bilaterally. Marked lower extremity edema bilaterally that is nonpitting. This does not seem to extend into his thigh. Notes Wound exam; he has substantial albeit superficial wounds on the left posterior left anterior and right anterior. No evidence of surrounding infection and no edema. Electronic Signature(s) Signed: 05/21/2020 8:19:11 AM By: Linton Ham MD Entered By: Linton Ham on 05/20/2020 11:35:13 -------------------------------------------------------------------------------- Physician Orders Details Patient Name: Date of Service: Marvin Atlas, PA UL J. 05/20/2020 10:00 A M Medical Record Number: [id number] Patient Account Number: [id number] Date of Birth/Sex: Treating RN: 1960-09-30 (60 y.o. Male) Levan Hurst Primary Care Provider: [name] Other Clinician: Referring Provider: Treating Provider/Extender: Talitha Givens HN Weeks in Treatment: 0 Verbal / Phone Orders: No Diagnosis Coding Follow-up Appointments Return Appointment in 1 week. Bathing/ Shower/ Hygiene May shower with protection but do not get wound dressing(s) wet. Edema Control - Lymphedema / SCD / Other Bilateral Lower Extremities Elevate legs to the level of the heart or above for 30 minutes daily and/or when sitting, a frequency of: - throughout the day Avoid standing for long periods of time. Exercise regularly Moisturize legs daily. Wound Treatment Wound #1 - Lower Leg Wound Laterality: Right, Lateral Cleanser: Soap and Water 1 x Per Week Discharge Instructions: May shower and wash wound with dial antibacterial soap and water prior to dressing change. Peri-Wound  Care: Sween Lotion (Moisturizing lotion) 1 x Per Week Discharge Instructions: Apply moisturizing lotion as directed Prim Dressing: KerraCel Ag Gelling Fiber Dressing, 4x5 in (silver alginate) 1 x Per Week ary Discharge Instructions: Apply silver alginate to wound bed as instructed Secondary Dressing: ABD Pad, 8x10 1 x Per Week Discharge Instructions: Apply over primary dressing as directed. Secondary Dressing: Zetuvit Plus 4x8 in 1 x Per Week Discharge Instructions: Apply over primary dressing as directed. Compression Wrap: FourPress (4 layer compression wrap) 1 x Per Week Discharge Instructions: Apply four layer compression as directed. Compression Wrap: Unna boot below knee to help secure wrap in place 1 x Per Week Wound #2 - Lower Leg Wound Laterality: Left, Anterior Cleanser: Soap and Water 1 x Per Week Discharge Instructions: May shower and wash wound with dial antibacterial soap and water prior to dressing change. Peri-Wound Care: Sween Lotion (Moisturizing lotion) 1 x Per Week Discharge Instructions: Apply moisturizing lotion as directed Prim Dressing: KerraCel Ag Gelling Fiber Dressing, 4x5 in (silver alginate) 1 x Per Week ary Discharge Instructions: Apply silver alginate to wound bed as instructed Secondary Dressing: ABD Pad, 8x10 1 x Per Week Discharge Instructions: Apply over primary dressing as directed. Secondary Dressing: Zetuvit Plus 4x8 in 1 x Per Week Discharge Instructions: Apply over primary dressing as directed. Compression Wrap: FourPress (4 layer compression wrap) 1 x Per Week Discharge Instructions: Apply four layer compression as directed. Compression Wrap: Unna boot below knee to help secure wrap in place 1 x Per Week Wound #3 - Lower Leg Wound Laterality: Left, Posterior Cleanser: Soap and Water 1 x Per Week Discharge Instructions: May shower and wash wound with dial antibacterial soap and water prior to dressing change. Peri-Wound Care: Sween Lotion  (Moisturizing lotion) 1 x Per Week Discharge Instructions: Apply moisturizing lotion as directed Prim Dressing: Paulina Fusi  Ag Gelling Fiber Dressing, 4x5 in (silver alginate) 1 x Per Week ary Discharge Instructions: Apply silver alginate to wound bed as instructed Secondary Dressing: ABD Pad, 8x10 1 x Per Week Discharge Instructions: Apply over primary dressing as directed. Secondary Dressing: Zetuvit Plus 4x8 in 1 x Per Week Discharge Instructions: Apply over primary dressing as directed. Compression Wrap: FourPress (4 layer compression wrap) 1 x Per Week Discharge Instructions: Apply four layer compression as directed. Compression Wrap: Unna boot below knee to help secure wrap in place 1 x Per Week Electronic Signature(s) Signed: 05/20/2020 4:14:28 PM By: Levan Hurst RN, BSN Signed: 05/21/2020 8:19:11 AM By: Linton Ham MD Entered By: Levan Hurst on 05/20/2020 11:08:36 -------------------------------------------------------------------------------- Problem List Details Patient Name: Date of Service: Marvin Atlas, PA UL J. 05/20/2020 10:00 A M Medical Record Number: [id number] Patient Account Number: [id number] Date of Birth/Sex: Treating RN: 01/08/1961 (60 y.o. Male) Levan Hurst Primary Care Provider: [name] Other Clinician: Referring Provider: Treating Provider/Extender: Talitha Givens HN Weeks in Treatment: 0 Active Problems ICD-10 Encounter Code Description Active Date MDM Diagnosis I87.333 Chronic venous hypertension (idiopathic) with ulcer and inflammation of 05/20/2020 No Yes bilateral lower extremity I89.0 Lymphedema, not elsewhere classified 05/20/2020 No Yes L97.811 Non-pressure chronic ulcer of other part of right lower leg limited to breakdown 05/20/2020 No Yes of skin L97.821 Non-pressure chronic ulcer of other part of left lower leg limited to breakdown 05/20/2020 No Yes of skin Inactive Problems Resolved Problems Electronic  Signature(s) Signed: 05/21/2020 8:19:11 AM By: Linton Ham MD Entered By: Linton Ham on 05/20/2020 11:16:49 -------------------------------------------------------------------------------- Progress Note Details Patient Name: Date of Service: Marvin Atlas, PA UL J. 05/20/2020 10:00 A M Medical Record Number: [id number] Patient Account Number: [id number] Date of Birth/Sex: Treating RN: 09-07-1960 (60 y.o. Male) Levan Hurst Primary Care Provider: [name] Other Clinician: Referring Provider: Treating Provider/Extender: Talitha Givens HN Weeks in Treatment: 0 Subjective Chief Complaint Information obtained from Patient 05/20/2020; patient is here for review of wounds on his bilateral lower legs in the setting of severe chronic venous insufficiency with lymphedema History of Present Illness (HPI) ADMISSION 05/20/2020 This is a 60 year old man who is not a diabetic however he has a long history of chronic venous insufficiency with wounds on his lower extremities. He went to the wound care center in [place] for a long period of time with recurrent wounds in these areas treated with compression wraps. He comes in today with wounds on his bilateral lower legs that he says have been there about a year on the right and about 6 months on the left. He went to see Dr. Oneida Alar on 01/17/2020 who noted that he had no history of DVT but does have a history of venous insufficiency wounds. They noted that he was considering laser ablation in October 2020 but he canceled the procedure. He had an episode of bleeding from varicose veins in 2019. He had repeated venous reflux study on 10/6 which showed no evidence of a DVT or SVT Noted that he had venous reflux in the right greater saphenous vein in the thigh and the calf on the right. Also . noted to have venous reflux in the right femoral vein right popliteal vein and right small saphenous vein. At the time in October they wanted the  wounds to heal a bit he also also had a right groin wound which is apparently closed. Not sure about the follow-up with vein and vascular. He comes in  today with some light compression wraps and collagen on the wound placed by Dr. Lilia Pro who is a Education officer, environmental in [place]. He has 2 areas on the left leg 1 anteriorly and a large area posteriorly and a smaller area on the right anterior lower leg. He has marked lower extremity edema which is nonpitting. Hemosiderin deposition on both lower legs extensively. There is dilated venules in his feet. Past medical history includes history of opioid abuse in remission, left nephrolithiasis, history of an ablation although I did not see this in Dr. Oneida Alar notes, congestive heart failure and anemia ABIs in our clinic were noncompressible bilaterally Patient History Information obtained from Patient. Allergies No Known Allergies Family History Cancer, Diabetes - Father, Hypertension - Father, No family history of Heart Disease, Hereditary Spherocytosis, Kidney Disease, Lung Disease, Seizures, Stroke, Thyroid Problems, Tuberculosis. Social History Current every day smoker, Marital Status - Divorced, Alcohol Use - Never, Drug Use - Prior History, Caffeine Use - Rarely. Medical History Eyes Denies history of Cataracts, Glaucoma, Optic Neuritis Ear/Nose/Mouth/Throat Denies history of Chronic sinus problems/congestion, Middle ear problems Hematologic/Lymphatic Patient has history of Anemia Denies history of Hemophilia, Human Immunodeficiency Virus, Lymphedema, Sickle Cell Disease Respiratory Patient has history of Sleep Apnea Denies history of Aspiration, Asthma, Chronic Obstructive Pulmonary Disease (COPD), Pneumothorax, Tuberculosis Cardiovascular Patient has history of Congestive Heart Failure, Coronary Artery Disease Denies history of Angina, Arrhythmia, Deep Vein Thrombosis, Hypertension, Hypotension, Myocardial Infarction, Peripheral Arterial  Disease, Peripheral Venous Disease, Phlebitis, Vasculitis Gastrointestinal Denies history of Cirrhosis , Colitis, Crohnoos, Hepatitis A, Hepatitis B, Hepatitis C Endocrine Denies history of Type I Diabetes, Type II Diabetes Genitourinary Denies history of End Stage Renal Disease Immunological Denies history of Lupus Erythematosus, Raynaudoos, Scleroderma Integumentary (Skin) Denies history of History of Burn Musculoskeletal Patient has history of Osteoarthritis Denies history of Gout, Rheumatoid Arthritis, Osteomyelitis Neurologic Denies history of Dementia, Neuropathy, Quadriplegia, Paraplegia, Seizure Disorder Oncologic Denies history of Received Chemotherapy, Received Radiation Psychiatric Denies history of Anorexia/bulimia, Confinement Anxiety Review of Systems (ROS) Constitutional Symptoms (General Health) Denies complaints or symptoms of Fatigue, Fever, Chills, Marked Weight Change. Eyes Denies complaints or symptoms of Dry Eyes, Vision Changes, Glasses / Contacts. Ear/Nose/Mouth/Throat Denies complaints or symptoms of Chronic sinus problems or rhinitis. Respiratory Denies complaints or symptoms of Chronic or frequent coughs, Shortness of Breath. Cardiovascular Denies complaints or symptoms of Chest pain. Gastrointestinal Denies complaints or symptoms of Frequent diarrhea, Nausea, Vomiting. Endocrine Denies complaints or symptoms of Heat/cold intolerance. Genitourinary Denies complaints or symptoms of Frequent urination. Integumentary (Skin) Complains or has symptoms of Wounds. Musculoskeletal Denies complaints or symptoms of Muscle Pain, Muscle Weakness. Neurologic Denies complaints or symptoms of Numbness/parasthesias. Psychiatric Denies complaints or symptoms of Claustrophobia, Suicidal. Objective Constitutional Sitting or standing Blood Pressure is within target range for patient.. Pulse regular and within target range for patient.Marland Kitchen Respirations regular,  non-labored and within target range.. Temperature is normal and within the target range for the patient.Marland Kitchen Appears in no distress. Vitals Time Taken: 9:59 AM, Height: 71 in, Source: Stated, Weight: 210 lbs, Source: Stated, BMI: 29.3, Temperature: 97.8 F, Pulse: 65 bpm, Respiratory Rate: 18 breaths/min, Blood Pressure: 108/70 mmHg. Cardiovascular Pulses are robust at both the dorsalis pedis and posterior tibial arteries bilaterally. Marked lower extremity edema bilaterally that is nonpitting. This does not seem to extend into his thigh. General Notes: Wound exam; he has substantial albeit superficial wounds on the left posterior left anterior and right anterior. No evidence of surrounding infection and no edema. Integumentary (Hair, Skin)  Wound #1 status is Open. Original cause of wound was Gradually Appeared. The wound is located on the Right,Lateral Lower Leg. The wound measures 2.9cm length x 4.8cm width x 0.1cm depth; 10.933cm^2 area and 1.093cm^3 volume. There is no tunneling or undermining noted. There is a medium amount of serosanguineous drainage noted. The wound margin is distinct with the outline attached to the wound base. There is medium (34-66%) red, pink granulation within the wound bed. There is a medium (34-66%) amount of necrotic tissue within the wound bed including Adherent Slough. Wound #2 status is Open. Original cause of wound was Gradually Appeared. The wound is located on the Left,Anterior Lower Leg. The wound measures 1cm length x 2.5cm width x 0.1cm depth; 1.963cm^2 area and 0.196cm^3 volume. There is no tunneling or undermining noted. There is a medium amount of serosanguineous drainage noted. The wound margin is distinct with the outline attached to the wound base. There is medium (34-66%) red, pink granulation within the wound bed. There is a medium (34-66%) amount of necrotic tissue within the wound bed including Adherent Slough. Wound #3 status is Open. Original cause  of wound was Gradually Appeared. The wound is located on the Left,Posterior Lower Leg. The wound measures 11.8cm length x 6cm width x 0.1cm depth; 55.606cm^2 area and 5.561cm^3 volume. There is no tunneling or undermining noted. There is a medium amount of serosanguineous drainage noted. The wound margin is distinct with the outline attached to the wound base. There is medium (34-66%) red, pink granulation within the wound bed. There is a medium (34-66%) amount of necrotic tissue within the wound bed including Adherent Slough. 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 There was a Four Layer Compression Therapy Procedure by Levan Hurst, RN. Post procedure Diagnosis Wound #1: Same as Pre-Procedure Wound #2 Pre-procedure diagnosis of Wound #2 is a Venous Leg Ulcer located on the Left,Anterior Lower Leg . There was a Four Layer Compression Therapy Procedure by Levan Hurst, RN. Post procedure Diagnosis Wound #2: Same as Pre-Procedure Wound #3 Pre-procedure diagnosis of Wound #3 is a Venous Leg Ulcer located on the Left,Posterior Lower Leg . There was a Four Layer Compression Therapy Procedure by Levan Hurst, RN. Post procedure Diagnosis Wound #3: Same as Pre-Procedure Plan Follow-up Appointments: Return Appointment in 1 week. Bathing/ Shower/ Hygiene: May shower with protection but do not get wound dressing(s) wet. Edema Control - Lymphedema / SCD / Other: Elevate legs to the level of the heart or above for 30 minutes daily and/or when sitting, a frequency of: -  throughout the day Avoid standing for long periods of time. Exercise regularly Moisturize legs daily. WOUND #1: - Lower Leg Wound Laterality: Right, Lateral Cleanser: Soap and Water 1 x Per Week/ Discharge Instructions: May shower and wash wound with dial antibacterial soap and water prior to dressing change. Peri-Wound Care: Sween Lotion (Moisturizing lotion) 1 x Per Week/ Discharge Instructions: Apply moisturizing lotion as directed Prim Dressing: KerraCel Ag Gelling Fiber Dressing, 4x5 in (silver alginate) 1 x Per Week/ ary Discharge Instructions: Apply silver alginate to wound bed as instructed Secondary Dressing: ABD Pad, 8x10 1 x Per  Week/ Discharge Instructions: Apply over primary dressing as directed. Secondary Dressing: Zetuvit Plus 4x8 in 1 x Per Week/ Discharge Instructions: Apply over primary dressing as directed. Com pression Wrap: FourPress (4 layer compression wrap) 1 x Per Week/ Discharge Instructions: Apply four layer compression as directed. Com pression Wrap: Unna boot below knee to help secure wrap in place 1 x Per Week/ WOUND #2: - Lower Leg Wound Laterality: Left, Anterior Cleanser: Soap and Water 1 x Per Week/ Discharge Instructions: May shower and wash wound with dial antibacterial soap and water prior to dressing change. Peri-Wound Care: Sween Lotion (Moisturizing lotion) 1 x Per Week/ Discharge Instructions: Apply moisturizing lotion as directed Prim Dressing: KerraCel Ag Gelling Fiber Dressing, 4x5 in (silver alginate) 1 x Per Week/ ary Discharge Instructions: Apply silver alginate to wound bed as instructed Secondary Dressing: ABD Pad, 8x10 1 x Per Week/ Discharge Instructions: Apply over primary dressing as directed. Secondary Dressing: Zetuvit Plus 4x8 in 1 x Per Week/ Discharge Instructions: Apply over primary dressing as directed. Com pression Wrap: FourPress (4 layer compression wrap) 1 x Per Week/ Discharge Instructions: Apply four layer compression  as directed. Com pression Wrap: Unna boot below knee to help secure wrap in place 1 x Per Week/ WOUND #3: - Lower Leg Wound Laterality: Left, Posterior Cleanser: Soap and Water 1 x Per Week/ Discharge Instructions: May shower and wash wound with dial antibacterial soap and water prior to dressing change. Peri-Wound Care: Sween Lotion (Moisturizing lotion) 1 x Per Week/ Discharge Instructions: Apply moisturizing lotion as directed Prim Dressing: KerraCel Ag Gelling Fiber Dressing, 4x5 in (silver alginate) 1 x Per Week/ ary Discharge Instructions: Apply silver alginate to wound bed as instructed Secondary Dressing: ABD Pad, 8x10 1 x Per Week/ Discharge Instructions: Apply over primary dressing as directed. Secondary Dressing: Zetuvit Plus 4x8 in 1 x Per Week/ Discharge Instructions: Apply over primary dressing as directed. Com pression Wrap: FourPress (4 layer compression wrap) 1 x Per Week/ Discharge Instructions: Apply four layer compression as directed. Com pression Wrap: Unna boot below knee to help secure wrap in place 1 x Per Week/ 1. I think this patient has severe chronic venous insufficiency with some element of secondary lymphedema. 2. He is not going to be able to heal these wounds with the extent of the edema in his bilateral lower legs he will need 4-layer compression. This is more important than what ever I put on the current wounds which is silver alginate and ABDs 3. I explained to him that these are not going to heal unless we can control the swelling and he seemed well versed in this already. I told him he needed to keep his legs elevated at home although exercise with walking was also recommended. 4. 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 5. I am not sure where his follow-up is with Dr. Oneida Alar I will see if I can find out on epic. Ablations probably would be helpful but again I think getting  the swelling down and getting things under control in his lower extremities would probably be in order 6. In speaking to the patient I do not think he was compliant with stockings before either 1 of these wound areas open. He might be a candidate for compression pumps as well at some point. 7. Although the patient's ABIs were noncompressible in our clinic bilaterally I really do not think there  was any L of element of significant PAD I spent 35 minutes in review of this patient's past medical history, face-to-face evaluation and preparation of this record Electronic Signature(s) Signed: 05/21/2020 8:19:11 AM By: Linton Ham MD Entered By: Linton Ham on 05/20/2020 11:37:46 -------------------------------------------------------------------------------- HxROS Details Patient Name: Date of Service: Marvin Atlas, PA UL J. 05/20/2020 10:00 A M Medical Record Number: [id number] Patient Account Number: [id number] Date of Birth/Sex: Treating RN: 04/17/1960 (60 y.o. Male) Rhae Hammock Primary Care Provider: [name] Other Clinician: Referring Provider: Treating Provider/Extender: Talitha Givens HN Weeks in Treatment: 0 Information Obtained From Patient Constitutional Symptoms (General Health) Complaints and Symptoms: Negative for: Fatigue; Fever; Chills; Marked Weight Change Eyes Complaints and Symptoms: Negative for: Dry Eyes; Vision Changes; Glasses / Contacts Medical History: Negative for: Cataracts; Glaucoma; Optic Neuritis Ear/Nose/Mouth/Throat Complaints and Symptoms: Negative for: Chronic sinus problems or rhinitis Medical History: Negative for: Chronic sinus problems/congestion; Middle ear problems Respiratory Complaints and Symptoms: Negative for: Chronic or frequent coughs; Shortness of Breath Medical History: Positive for: Sleep Apnea Negative for: Aspiration; Asthma; Chronic Obstructive Pulmonary Disease (COPD); Pneumothorax;  Tuberculosis Cardiovascular Complaints and Symptoms: Negative for: Chest pain Medical History: Positive for: Congestive Heart Failure; Coronary Artery Disease Negative for: Angina; Arrhythmia; Deep Vein Thrombosis; Hypertension; Hypotension; Myocardial Infarction; Peripheral Arterial Disease; Peripheral Venous Disease; Phlebitis; Vasculitis Gastrointestinal Complaints and Symptoms: Negative for: Frequent diarrhea; Nausea; Vomiting Medical History: Negative for: Cirrhosis ; Colitis; Crohns; Hepatitis A; Hepatitis B; Hepatitis C Endocrine Complaints and Symptoms: Negative for: Heat/cold intolerance Medical History: Negative for: Type I Diabetes; Type II Diabetes Genitourinary Complaints and Symptoms: Negative for: Frequent urination Medical History: Negative for: End Stage Renal Disease Integumentary (Skin) Complaints and Symptoms: Positive for: Wounds Medical History: Negative for: History of Burn Musculoskeletal Complaints and Symptoms: Negative for: Muscle Pain; Muscle Weakness Medical History: Positive for: Osteoarthritis Negative for: Gout; Rheumatoid Arthritis; Osteomyelitis Neurologic Complaints and Symptoms: Negative for: Numbness/parasthesias Medical History: Negative for: Dementia; Neuropathy; Quadriplegia; Paraplegia; Seizure Disorder Psychiatric Complaints and Symptoms: Negative for: Claustrophobia; Suicidal Medical History: Negative for: Anorexia/bulimia; Confinement Anxiety Hematologic/Lymphatic Medical History: Positive for: Anemia Negative for: Hemophilia; Human Immunodeficiency Virus; Lymphedema; Sickle Cell Disease Immunological Medical History: Negative for: Lupus Erythematosus; Raynauds; Scleroderma Oncologic Medical History: Negative for: Received Chemotherapy; Received Radiation Immunizations Pneumococcal Vaccine: Received Pneumococcal Vaccination: Yes Implantable Devices None Family and Social History Cancer: Yes; Diabetes: Yes -  Father; Heart Disease: No; Hereditary Spherocytosis: No; Hypertension: Yes - Father; Kidney Disease: No; Lung Disease: No; Seizures: No; Stroke: No; Thyroid Problems: No; Tuberculosis: No; Current every day smoker; Marital Status - Divorced; Alcohol Use: Never; Drug Use: Prior History; Caffeine Use: Rarely; Financial Concerns: No; Food, Clothing or Shelter Needs: No; Support System Lacking: No; Transportation Concerns: No Engineer, maintenance) Signed: 05/21/2020 8:19:11 AM By: Linton Ham MD Signed: 05/21/2020 5:05:22 PM By: Rhae Hammock RN Entered By: Rhae Hammock on 05/20/2020 10:25:44 -------------------------------------------------------------------------------- SuperBill Details Patient Name: Date of Service: Marvin Atlas, PA UL J. 05/20/2020 Medical Record Number: [id number] Patient Account Number: [id number] Date of Birth/Sex: Treating RN: 02/02/1961 (60 y.o. 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 Facility Procedures CPT4: Code [id number] Description: 13 - WOUND CARE VISIT-LEV 3 EST PT Modifier: 25 Quantity: 1 Physician Procedures : CPT4 Code Description Modifier [id number] [place] PHYS LEVEL 3 NEW PT ICD-10 Diagnosis Description I87.333 Chronic venous hypertension (idiopathic) with ulcer and inflammation of bilateral lower extremity I89.0 Lymphedema, not elsewhere classified L97.811  Non-pressure chronic ulcer of other part of right lower leg limited  to breakdown of skin L97.821 Non-pressure chronic ulcer of other part of left lower leg limited to breakdown of skin Quantity: 1 Electronic Signature(s) Signed: 05/21/2020 8:19:11 AM By: Linton Ham MD Entered By: Linton Ham on 05/20/2020 11:38:17

## 2020-05-27 ENCOUNTER — Encounter (HOSPITAL_BASED_OUTPATIENT_CLINIC_OR_DEPARTMENT_OTHER): Payer: PPO | Admitting: Internal Medicine

## 2020-05-27 ENCOUNTER — Other Ambulatory Visit: Payer: Self-pay

## 2020-05-27 DIAGNOSIS — L97811 Non-pressure chronic ulcer of other part of right lower leg limited to breakdown of skin: Secondary | ICD-10-CM | POA: Diagnosis not present

## 2020-05-27 DIAGNOSIS — L97222 Non-pressure chronic ulcer of left calf with fat layer exposed: Secondary | ICD-10-CM | POA: Diagnosis not present

## 2020-05-27 DIAGNOSIS — L97821 Non-pressure chronic ulcer of other part of left lower leg limited to breakdown of skin: Secondary | ICD-10-CM | POA: Diagnosis not present

## 2020-05-27 DIAGNOSIS — I87333 Chronic venous hypertension (idiopathic) with ulcer and inflammation of bilateral lower extremity: Secondary | ICD-10-CM | POA: Diagnosis not present

## 2020-05-27 NOTE — Progress Notes (Signed)
Marvin, [name] ([id number]) Visit Report for 05/27/2020 HPI Details Patient Name: Date of Service: [name], [place] J. 05/27/2020 9:30 A M Medical Record Number: [id number] Patient Account Number: [id number] Date of Birth/Sex: Treating RN: 23-Aug-1960 (60 y.o. [name] Primary Care Provider: [name] Other Clinician: Referring Provider: Treating Provider/Extender: Talitha Givens HN Weeks in Treatment: 1 History of Present Illness HPI Description: ADMISSION 05/20/2020 This is a 60 year old man who is not a diabetic however he has a long history of chronic venous insufficiency with wounds on his lower extremities. He went to the wound care center in [place] for a long period of time with recurrent wounds in these areas treated with compression wraps. He comes in today with wounds on his bilateral lower legs that he says have been there about a year on the right and about 6 months on the left. He went to see Dr. Oneida Alar on 01/17/2020 who noted that he had no history of DVT but does have a history of venous insufficiency wounds. They noted that he was considering laser ablation in October 2020 but he canceled the procedure. He had an episode of bleeding from varicose veins in 2019. He had repeated venous reflux study on 10/6 which showed no evidence of a DVT or SVT Noted that he had venous reflux in the right greater saphenous vein in the thigh and the calf on the right. Also . noted to have venous reflux in the right femoral vein right popliteal vein and right small saphenous vein. At the time in October they wanted the wounds to heal a bit he also also had a right groin wound which is apparently closed. Not sure about the follow-up with vein and vascular. He comes in today with some light compression wraps and collagen on the wound placed by Dr. Lilia Pro who is a Education officer, environmental in [place]. He has 2 areas on the left leg 1 anteriorly and a large area posteriorly and a  smaller area on the right anterior lower leg. He has marked lower extremity edema which is nonpitting. Hemosiderin deposition on both lower legs extensively. There is dilated venules in his feet. Past medical history includes history of opioid abuse in remission, left nephrolithiasis, history of an ablation although I did not see this in Dr. Oneida Alar notes, congestive heart failure and anemia ABIs in our clinic were noncompressible bilaterally 05/27/2020; we admitted this patient to clinic last week. He has bilateral superficial venous insufficiency with bilateral wounds left greater than right. We put him in 4 layer compression with silver alginate. The he was previously cared for in the [facility] wound care clinic when it was open. His wounds are better with compression this week and there is certainly less swelling. Electronic Signature(s) Signed: 05/27/2020 5:51:45 PM By: Linton Ham MD Entered By: Linton Ham on 05/27/2020 10:54:32 -------------------------------------------------------------------------------- Physical Exam Details Patient Name: Date of Service: Marvin Atlas, PA UL J. 05/27/2020 9:30 A M Medical Record Number: [id number] Patient Account Number: [id number] Date of Birth/Sex: Treating RN: 1960/12/07 (60 y.o. [name] Primary Care Provider: [name] Other Clinician: Referring Provider: Treating Provider/Extender: Talitha Givens HN Weeks in Treatment: 1 Constitutional Patient is hypotensive. But appears well. Pulse regular and within target range for patient.Marland Kitchen Respirations regular, non-labored and within target range.. Temperature is normal and within the target range for the patient.Marland Kitchen Appears in no distress. Cardiovascular Pedal pulses are palpable. Edema is much better controlled on his  bilateral lower legs. He has hemosiderin deposition. Notes Wound exam; he has superficial wound areas on both legs. On the left anteriorly and a large area  posteriorly. I think all of this looks somewhat better there is multiple open wounds. On the right anterior his wounds appear smaller. No debridement is required in any area mechanically although I did wash these off with Anasept and gauze as a form of debridement. Electronic Signature(s) Signed: 05/27/2020 5:51:45 PM By: Linton Ham MD Entered By: Linton Ham on 05/27/2020 10:56:00 -------------------------------------------------------------------------------- Physician Orders Details Patient Name: Date of Service: Marvin Atlas, PA UL J. 05/27/2020 9:30 A M Medical Record Number: [id number] Patient Account Number: [id number] Date of Birth/Sex: Treating RN: 10/24/1960 (60 y.o. [name] Primary Care Provider: [name] Other Clinician: Referring Provider: Treating Provider/Extender: Talitha Givens HN Weeks in Treatment: 1 Verbal / Phone Orders: No Diagnosis Coding ICD-10 Coding Code Description [phone] Chronic venous hypertension (idiopathic) with ulcer and inflammation of bilateral lower extremity I89.0 Lymphedema, not elsewhere classified L97.811 Non-pressure chronic ulcer of other part of right lower leg limited to breakdown of skin L97.821 Non-pressure chronic ulcer of other part of left lower leg limited to breakdown of skin Follow-up Appointments Return Appointment in 1 week. Bathing/ Shower/ Hygiene May shower with protection but do not get wound dressing(s) wet. Edema Control - Lymphedema / SCD / Other Bilateral Lower Extremities Elevate legs to the level of the heart or above for 30 minutes daily and/or when sitting, a frequency of: - throughout the day Avoid standing for long periods of time. Exercise regularly Moisturize legs daily. Wound Treatment Wound #1 - Lower Leg Wound Laterality: Right, Lateral Cleanser: Soap and Water 1 x Per Week Discharge Instructions: May shower and wash wound with dial antibacterial soap and water prior  to dressing change. Peri-Wound Care: Sween Lotion (Moisturizing lotion) 1 x Per Week Discharge Instructions: Apply moisturizing lotion as directed Prim Dressing: KerraCel Ag Gelling Fiber Dressing, 4x5 in (silver alginate) 1 x Per Week ary Discharge Instructions: Apply silver alginate to wound bed as instructed Secondary Dressing: ABD Pad, 8x10 1 x Per Week Discharge Instructions: Apply over primary dressing as directed. Secondary Dressing: Zetuvit Plus 4x8 in 1 x Per Week Discharge Instructions: Apply over primary dressing as directed. Compression Wrap: FourPress (4 layer compression wrap) 1 x Per Week Discharge Instructions: Apply four layer compression as directed. Compression Wrap: Unna boot below knee to help secure wrap in place 1 x Per Week Wound #2 - Lower Leg Wound Laterality: Left, Anterior Cleanser: Soap and Water 1 x Per Week Discharge Instructions: May shower and wash wound with dial antibacterial soap and water prior to dressing change. Peri-Wound Care: Sween Lotion (Moisturizing lotion) 1 x Per Week Discharge Instructions: Apply moisturizing lotion as directed Prim Dressing: KerraCel Ag Gelling Fiber Dressing, 4x5 in (silver alginate) 1 x Per Week ary Discharge Instructions: Apply silver alginate to wound bed as instructed Secondary Dressing: ABD Pad, 8x10 1 x Per Week Discharge Instructions: Apply over primary dressing as directed. Secondary Dressing: Zetuvit Plus 4x8 in 1 x Per Week Discharge Instructions: Apply over primary dressing as directed. Compression Wrap: FourPress (4 layer compression wrap) 1 x Per Week Discharge Instructions: Apply four layer compression as directed. Compression Wrap: Unna boot below knee to help secure wrap in place 1 x Per Week Wound #3 - Lower Leg Wound Laterality: Left, Posterior Cleanser: Soap and Water 1 x Per Week Discharge Instructions: May shower and wash wound with  dial antibacterial soap and water prior to dressing  change. Peri-Wound Care: Sween Lotion (Moisturizing lotion) 1 x Per Week Discharge Instructions: Apply moisturizing lotion as directed Prim Dressing: KerraCel Ag Gelling Fiber Dressing, 4x5 in (silver alginate) 1 x Per Week ary Discharge Instructions: Apply silver alginate to wound bed as instructed Secondary Dressing: ABD Pad, 8x10 1 x Per Week Discharge Instructions: Apply over primary dressing as directed. Secondary Dressing: Zetuvit Plus 4x8 in 1 x Per Week Discharge Instructions: Apply over primary dressing as directed. Compression Wrap: FourPress (4 layer compression wrap) 1 x Per Week Discharge Instructions: Apply four layer compression as directed. Compression Wrap: Unna boot below knee to help secure wrap in place 1 x Per Week Electronic Signature(s) Signed: 05/27/2020 5:50:50 PM By: Levan Hurst RN, BSN Signed: 05/27/2020 5:51:45 PM By: Linton Ham MD Entered By: Levan Hurst on 05/27/2020 10:47:25 -------------------------------------------------------------------------------- Problem List Details Patient Name: Date of Service: Marvin Atlas, PA UL J. 05/27/2020 9:30 A M Medical Record Number: [id number] Patient Account Number: [id number] Date of Birth/Sex: Treating RN: Jun 07, 1960 (60 y.o. [name] Primary Care Provider: [name] Other Clinician: Referring Provider: Treating Provider/Extender: Talitha Givens HN Weeks in Treatment: 1 Active Problems ICD-10 Encounter Code Description Active Date MDM Diagnosis I87.333 Chronic venous hypertension (idiopathic) with ulcer and inflammation of 05/20/2020 No Yes bilateral lower extremity I89.0 Lymphedema, not elsewhere classified 05/20/2020 No Yes L97.811 Non-pressure chronic ulcer of other part of right lower leg limited to breakdown 05/20/2020 No Yes of skin L97.821 Non-pressure chronic ulcer of other part of left lower leg limited to breakdown 05/20/2020 No Yes of skin Inactive Problems Resolved  Problems Electronic Signature(s) Signed: 05/27/2020 5:51:45 PM By: Linton Ham MD Entered By: Linton Ham on 05/27/2020 10:53:01 -------------------------------------------------------------------------------- Progress Note Details Patient Name: Date of Service: Marvin Atlas, PA UL J. 05/27/2020 9:30 A M Medical Record Number: [id number] Patient Account Number: [id number] Date of Birth/Sex: Treating RN: 10-26-1960 (60 y.o. [name] Primary Care Provider: [name] Other Clinician: Referring Provider: Treating Provider/Extender: Talitha Givens HN Weeks in Treatment: 1 Subjective History of Present Illness (HPI) ADMISSION 05/20/2020 This is a 60 year old man who is not a diabetic however he has a long history of chronic venous insufficiency with wounds on his lower extremities. He went to the wound care center in [place] for a long period of time with recurrent wounds in these areas treated with compression wraps. He comes in today with wounds on his bilateral lower legs that he says have been there about a year on the right and about 6 months on the left. He went to see Dr. Oneida Alar on 01/17/2020 who noted that he had no history of DVT but does have a history of venous insufficiency wounds. They noted that he was considering laser ablation in October 2020 but he canceled the procedure. He had an episode of bleeding from varicose veins in 2019. He had repeated venous reflux study on 10/6 which showed no evidence of a DVT or SVT Noted that he had venous reflux in the right greater saphenous vein in the thigh and the calf on the right. Also . noted to have venous reflux in the right femoral vein right popliteal vein and right small saphenous vein. At the time in October they wanted the wounds to heal a bit he also also had a right groin wound which is apparently closed. Not sure about the follow-up with vein and vascular. He comes in  today with some light  compression wraps and collagen on the wound placed by Dr. Lilia Pro who is a Education officer, environmental in [place]. He has 2 areas on the left leg 1 anteriorly and a large area posteriorly and a smaller area on the right anterior lower leg. He has marked lower extremity edema which is nonpitting. Hemosiderin deposition on both lower legs extensively. There is dilated venules in his feet. Past medical history includes history of opioid abuse in remission, left nephrolithiasis, history of an ablation although I did not see this in Dr. Oneida Alar notes, congestive heart failure and anemia ABIs in our clinic were noncompressible bilaterally 05/27/2020; we admitted this patient to clinic last week. He has bilateral superficial venous insufficiency with bilateral wounds left greater than right. We put him in 4 layer compression with silver alginate. The he was previously cared for in the [facility] wound care clinic when it was open. His wounds are better with compression this week and there is certainly less swelling. Objective Constitutional Patient is hypotensive. But appears well. Pulse regular and within target range for patient.Marland Kitchen Respirations regular, non-labored and within target range.. Temperature is normal and within the target range for the patient.Marland Kitchen Appears in no distress. Vitals Time Taken: 10:13 AM, Height: 71 in, Weight: 210 lbs, BMI: 29.3, Temperature: 97.8 F, Pulse: 62 bpm, Respiratory Rate: 18 breaths/min, Blood Pressure: 98/61 mmHg. Cardiovascular Pedal pulses are palpable. Edema is much better controlled on his bilateral lower legs. He has hemosiderin deposition. General Notes: Wound exam; he has superficial wound areas on both legs. On the left anteriorly and a large area posteriorly. I think all of this looks somewhat better there is multiple open wounds. On the right anterior his wounds appear smaller. No debridement is required in any area mechanically although I did wash these off with Anasept  and gauze as a form of debridement. Integumentary (Hair, Skin) Wound #1 status is Open. Original cause of wound was Gradually Appeared. The wound is located on the Right,Lateral Lower Leg. The wound measures 2.5cm length x 4.7cm width x 0.1cm depth; 9.228cm^2 area and 0.923cm^3 volume. There is no tunneling or undermining noted. There is a medium amount of serosanguineous drainage noted. The wound margin is distinct with the outline attached to the wound base. There is large (67-100%) red, pink granulation within the wound bed. There is a small (1-33%) amount of necrotic tissue within the wound bed including Adherent Slough. Wound #2 status is Open. Original cause of wound was Gradually Appeared. The wound is located on the Left,Anterior Lower Leg. The wound measures 0.9cm length x 1cm width x 0.1cm depth; 0.707cm^2 area and 0.071cm^3 volume. There is no tunneling or undermining noted. There is a medium amount of serosanguineous drainage noted. The wound margin is distinct with the outline attached to the wound base. There is large (67-100%) red, pink granulation within the wound bed. There is a small (1-33%) amount of necrotic tissue within the wound bed including Adherent Slough. Wound #3 status is Open. Original cause of wound was Gradually Appeared. The wound is located on the Left,Posterior Lower Leg. The wound measures 12cm length x 5cm width x 0.1cm depth; 47.124cm^2 area and 4.712cm^3 volume. There is Fat Layer (Subcutaneous Tissue) exposed. There is no tunneling or undermining noted. There is a medium amount of serosanguineous drainage noted. The wound margin is distinct with the outline attached to the wound base. There is large (67-100%) red, pink granulation within the wound bed. There is a small (1-33%) amount  of necrotic tissue within the wound bed including Adherent Slough. 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 There was a Four Layer Compression Therapy Procedure by Levan Hurst, RN. Post procedure Diagnosis Wound #1: Same as Pre-Procedure Wound #2 Pre-procedure diagnosis of Wound #2 is a Venous Leg Ulcer located on the Left,Anterior Lower Leg . There was a Four Layer Compression Therapy Procedure by Levan Hurst, RN. Post procedure Diagnosis Wound #2: Same as Pre-Procedure Wound #3 Pre-procedure diagnosis of Wound #3 is a Venous Leg Ulcer located on the Left,Posterior Lower Leg . There was a Four Layer Compression Therapy Procedure by Levan Hurst, RN. Post procedure Diagnosis Wound #3: Same as Pre-Procedure Plan Follow-up Appointments: Return Appointment in 1 week. Bathing/ Shower/ Hygiene: May shower with protection but do not get wound dressing(s) wet. Edema Control - Lymphedema / SCD / Other: Elevate legs to the level of the heart or above for 30 minutes daily and/or when sitting, a frequency of: - throughout the day Avoid standing for long periods of time. Exercise regularly Moisturize legs daily. WOUND #1: - Lower Leg Wound Laterality: Right, Lateral Cleanser: Soap and Water 1 x Per Week/ Discharge Instructions: May shower and wash wound with dial antibacterial soap and water prior to dressing change. Peri-Wound Care: Sween Lotion (Moisturizing lotion) 1 x Per Week/ Discharge Instructions: Apply moisturizing lotion as directed Prim Dressing: KerraCel Ag Gelling Fiber Dressing, 4x5 in (silver alginate) 1 x Per Week/ ary Discharge Instructions: Apply silver alginate to wound bed as instructed Secondary Dressing: ABD Pad, 8x10  1 x Per Week/ Discharge Instructions: Apply over primary dressing as directed. Secondary Dressing: Zetuvit Plus 4x8 in 1 x Per Week/ Discharge Instructions: Apply over primary dressing as directed. Com pression Wrap: FourPress (4 layer compression wrap) 1 x Per Week/ Discharge Instructions: Apply four layer compression as directed. Com pression Wrap: Unna boot below knee to help secure wrap in place 1 x Per Week/ WOUND #2: - Lower Leg Wound Laterality: Left, Anterior Cleanser: Soap and Water 1 x Per Week/ Discharge Instructions: May shower and wash wound with dial antibacterial soap and water prior to dressing change. Peri-Wound Care: Sween Lotion (Moisturizing lotion) 1 x Per Week/ Discharge Instructions: Apply moisturizing lotion as directed Prim Dressing: KerraCel Ag Gelling Fiber Dressing, 4x5 in (silver alginate) 1 x Per Week/ ary Discharge Instructions: Apply silver alginate to wound bed as instructed Secondary Dressing: ABD Pad, 8x10 1 x Per Week/ Discharge Instructions: Apply over primary dressing as directed. Secondary Dressing: Zetuvit Plus 4x8 in 1 x Per Week/ Discharge Instructions: Apply over primary dressing as directed. Com pression Wrap: FourPress (4 layer compression wrap) 1 x Per Week/ Discharge Instructions: Apply four layer compression as directed. Com pression Wrap: Unna boot below knee to help secure wrap in place 1 x Per Week/ WOUND #3: - Lower Leg Wound Laterality: Left, Posterior Cleanser: Soap and Water 1 x Per Week/ Discharge Instructions: May shower and wash wound with dial antibacterial soap and water prior to dressing change. Peri-Wound Care: Sween Lotion (Moisturizing lotion) 1 x Per Week/ Discharge Instructions: Apply moisturizing lotion as directed Prim Dressing: KerraCel Ag Heidi Dach  Fiber Dressing, 4x5 in (silver alginate) 1 x Per Week/ ary Discharge Instructions: Apply silver alginate to wound bed as instructed Secondary Dressing: ABD Pad, 8x10 1 x Per  Week/ Discharge Instructions: Apply over primary dressing as directed. Secondary Dressing: Zetuvit Plus 4x8 in 1 x Per Week/ Discharge Instructions: Apply over primary dressing as directed. Com pression Wrap: FourPress (4 layer compression wrap) 1 x Per Week/ Discharge Instructions: Apply four layer compression as directed. Com pression Wrap: Unna boot below knee to help secure wrap in place 1 x Per Week/ 1. I am continuing with silver alginate ABDs under 4-layer compression bilaterally. 2. He tolerated the 4-layer compression without ill effect 3. I am not sure that he has a follow-up with Dr. Oneida Alar to consider further ablations. The patient states that he Dr. Oneida Alar wanted the wounds healed before considering ablation interventions although I will have to look and see on this. 4. The patient may qualify for external compression pumps based on his previous time in [place] I will see if I can find this Electronic Signature(s) Signed: 05/27/2020 5:51:45 PM By: Linton Ham MD Entered By: Linton Ham on 05/27/2020 10:57:40 -------------------------------------------------------------------------------- SuperBill Details Patient Name: Date of Service: Marvin Atlas, PA UL J. 05/27/2020 Medical Record Number: [id number] Patient Account Number: [id number] Date of Birth/Sex: Treating RN: [birth year] (60 y.o. [name] Primary Care Provider: [name] Other Clinician: Referring Provider: Treating Provider/Extender: Talitha Givens HN Weeks in Treatment: 1 Diagnosis Coding ICD-10 Codes Code Description [phone] Chronic venous hypertension (idiopathic) with ulcer and inflammation of bilateral lower extremity I89.0 Lymphedema, not elsewhere classified L97.811 Non-pressure chronic ulcer of other part of right lower leg limited to breakdown of skin L97.821 Non-pressure chronic ulcer of other part of left lower leg limited to breakdown of skin Facility  Procedures CPT4: Code [id number] Description: 81 BILATERAL: Application of multi-layer venous compression system; leg (below knee), including ankle and t. Modifier: Quantity: 1 Physician Procedures : CPT4 Code Description Modifier [id number] - WC PHYS LEVEL 3 - EST PT ICD-10 Diagnosis Description I87.333 Chronic venous hypertension (idiopathic) with ulcer and inflammation of bilateral lower extremity I89.0 Lymphedema, not elsewhere classified  L97.811 Non-pressure chronic ulcer of other part of right lower leg limited to breakdown of skin L97.821 Non-pressure chronic ulcer of other part of left lower leg limited to breakdown of skin Quantity: 1 Electronic Signature(s) Signed: 05/27/2020 5:50:50 PM By: Levan Hurst RN, BSN Signed: 05/27/2020 5:51:45 PM By: Linton Ham MD Entered By: Levan Hurst on 05/27/2020 11:03:14

## 2020-05-27 NOTE — Progress Notes (Signed)
CHARLENE, COWDREY ([id number]) Visit Report for 05/27/2020 Arrival Information Details Patient Name: Date of Service: [name], [place] J. 05/27/2020 9:30 A M Medical Record Number: [id number] Patient Account Number: [id number] Date of Birth/Sex: Treating RN: April 30, 1960 (60 y.o. [name] Primary Care Kyrra Prada: [name] Other Clinician: Referring Malaina Mortellaro: Treating Geoffery Aultman/Extender: Talitha Givens HN Weeks in Treatment: 1 Visit Information History Since Last Visit Added or deleted any medications: No Patient Arrived: Ambulatory Any new allergies or adverse reactions: No Arrival Time: 10:13 Had a fall or experienced change in No Accompanied By: self activities of daily living that may affect Transfer Assistance: None risk of falls: Patient Identification Verified: Yes Signs or symptoms of abuse/neglect since last visito No Secondary Verification Process Completed: Yes Hospitalized since last visit: No Patient Requires Transmission-Based Precautions: No Implantable device outside of the clinic excluding No Patient Has Alerts: Yes cellular tissue based products placed in the center Patient Alerts: ABI's R: N/C since last visit: Has Dressing in Place as Prescribed: Yes Pain Present Now: Yes Electronic Signature(s) Signed: 05/27/2020 10:55:04 AM By: Sandre Kitty Entered By: Sandre Kitty on 05/27/2020 10:13:37 -------------------------------------------------------------------------------- Compression Therapy Details Patient Name: Date of Service: Marvin Atlas, [place] J. 05/27/2020 9:30 A M Medical Record Number: [id number] Patient Account Number: [id number] Date of Birth/Sex: Treating RN: [birth year] (60 y.o. [name] Primary Care Makaelah Cranfield: [name] Other Clinician: Referring Antoinne Spadaccini: Treating Hannahmarie Asberry/Extender: Talitha Givens HN Weeks in Treatment: 1 Compression Therapy Performed for Wound Assessment: Wound #1  Right,Lateral Lower Leg Performed By: Clinician Levan Hurst, RN Compression Type: Four Layer Post Procedure Diagnosis Same as Pre-procedure Electronic Signature(s) Signed: 05/27/2020 5:50:50 PM By: Levan Hurst RN, BSN Entered By: Levan Hurst on 05/27/2020 10:47:53 -------------------------------------------------------------------------------- Compression Therapy Details Patient Name: Date of Service: Marvin Atlas, PA UL J. 05/27/2020 9:30 A M Medical Record Number: [id number] Patient Account Number: [id number] Date of Birth/Sex: Treating RN: 10/31/1960 (60 y.o. [name] Primary Care Adam Sanjuan: [name] Other Clinician: Referring Keiran Sias: Treating Gaylia Kassel/Extender: Talitha Givens HN Weeks in Treatment: 1 Compression Therapy Performed for Wound Assessment: Wound #2 Left,Anterior Lower Leg Performed By: Clinician Levan Hurst, RN Compression Type: Four Layer Post Procedure Diagnosis Same as Pre-procedure Electronic Signature(s) Signed: 05/27/2020 5:50:50 PM By: Levan Hurst RN, BSN Entered By: Levan Hurst on 05/27/2020 10:47:53 -------------------------------------------------------------------------------- Compression Therapy Details Patient Name: Date of Service: Marvin Atlas, PA UL J. 05/27/2020 9:30 A M Medical Record Number: [id number] Patient Account Number: [id number] Date of Birth/Sex: Treating RN: 1960/07/30 (60 y.o. [name] Primary Care Annelie Boak: [name] Other Clinician: Referring Katlynn Naser: Treating Maeson Lourenco/Extender: Talitha Givens HN Weeks in Treatment: 1 Compression Therapy Performed for Wound Assessment: Wound #3 Left,Posterior Lower Leg Performed By: Clinician Levan Hurst, RN Compression Type: Four Layer Post Procedure Diagnosis Same as Pre-procedure Electronic Signature(s) Signed: 05/27/2020 5:50:50 PM By: Levan Hurst RN, BSN Entered By: Levan Hurst on 05/27/2020  10:47:53 -------------------------------------------------------------------------------- Encounter Discharge Information Details Patient Name: Date of Service: Marvin Atlas, PA UL J. 05/27/2020 9:30 A M Medical Record Number: [id number] Patient Account Number: [id number] Date of Birth/Sex: Treating RN: October 13, 1960 (60 y.o. Erie Noe Primary Care Simran Bomkamp: [name] Other Clinician: Referring Nyaja [place]: Treating Taronda Comacho/Extender: Talitha Givens HN Weeks in Treatment: 1 Encounter Discharge Information Items Discharge Condition: Stable Ambulatory Status: Ambulatory Discharge Destination: Home Transportation: Private Auto Accompanied By: self Schedule Follow-up Appointment: Yes  Clinical Summary of Care: Patient Declined Electronic Signature(s) Signed: 05/27/2020 5:09:26 PM By: Rhae Hammock RN Entered By: Rhae Hammock on 05/27/2020 11:36:24 -------------------------------------------------------------------------------- Lower Extremity Assessment Details Patient Name: Date of Service: Marvin Atlas, PA UL J. 05/27/2020 9:30 A M Medical Record Number: [id number] Patient Account Number: [id number] Date of Birth/Sex: Treating RN: [birth year] (60 y.o. [name] Primary Care Ivan Lacher: [name] Other Clinician: Referring Dorinne Graeff: Treating Lysle Yero/Extender: Talitha Givens HN Weeks in Treatment: 1 Edema Assessment Assessed: [Left: No] [Right: No] Edema: [Left: Yes] [Right: Yes] Calf Left: Right: Point of Measurement: From Medial Instep 40 cm 43 cm Ankle Left: Right: Point of Measurement: From Medial Instep 26.5 cm 26 cm Knee To Floor Left: Right: From Medial Instep 43 cm 43 cm Vascular Assessment Pulses: Dorsalis Pedis Palpable: [Left:Yes] [Right:Yes] Electronic Signature(s) Signed: 05/27/2020 5:50:50 PM By: Levan Hurst RN, BSN Entered By: Levan Hurst on 05/27/2020  10:52:26 -------------------------------------------------------------------------------- Multi Wound Chart Details Patient Name: Date of Service: Marvin Atlas, PA UL J. 05/27/2020 9:30 A M Medical Record Number: [id number] Patient Account Number: [id number] Date of Birth/Sex: Treating RN: 1961/04/01 (60 y.o. [name] Primary Care Reid Nawrot: [name] Other Clinician: Referring Terasa Orsini: Treating Itzabella Sorrels/Extender: Talitha Givens HN Weeks in Treatment: 1 Vital Signs Height(in): 71 Pulse(bpm): 62 Weight(lbs): 210 Blood Pressure(mmHg): 98/61 Body Mass Index(BMI): 29 Temperature(F): 97.8 Respiratory Rate(breaths/min): 18 Photos: [1:No Photos Right, Lateral Lower Leg] [2:No Photos Left, Anterior Lower Leg] [3:No Photos Left, Posterior Lower Leg] Wound Location: [1:Gradually Appeared] [2:Gradually Appeared] [3:Gradually Appeared] Wounding Event: [1:Venous Leg Ulcer] [2:Venous Leg Ulcer] [3:Venous Leg Ulcer] Primary Etiology: [1:Anemia, Sleep Apnea, Congestive] [2:Anemia, Sleep Apnea, Congestive] [3:Anemia, Sleep Apnea, Congestive] Comorbid History: [1:Heart Failure, Coronary Artery Disease, Osteoarthritis 05/15/2019] [2:Heart Failure, Coronary Artery Disease, Osteoarthritis 05/15/2019] [3:Heart Failure, Coronary Artery Disease, Osteoarthritis 05/15/2019] Date Acquired: [1:1] [2:1] [3:1] Weeks of Treatment: [1:Open] [2:Open] [3:Open] Wound Status: [1:2.5x4.7x0.1] [2:0.9x1x0.1] [3:12x5x0.1] Measurements L x W x D (cm) [1:9.228] [2:0.707] [3:47.124] A (cm) : rea [1:0.923] [2:0.071] [3:4.712] Volume (cm) : [1:15.60%] [2:64.00%] [3:15.30%] % Reduction in Area: [1:15.60%] [2:63.80%] [3:15.30%] % Reduction in Volume: [1:Full Thickness Without Exposed] [2:Full Thickness Without Exposed] [3:Full Thickness Without Exposed] Classification: [1:Support Structures Medium] [2:Support Structures Medium] [3:Support Structures Medium] Exudate Amount: [1:Serosanguineous]  [2:Serosanguineous] [3:Serosanguineous] Exudate Type: [1:red, brown] [2:red, brown] [3:red, brown] Exudate Color: [1:Distinct, outline attached] [2:Distinct, outline attached] [3:Distinct, outline attached] Wound Margin: [1:Large (67-100%)] [2:Large (67-100%)] [3:Large (67-100%)] Granulation Amount: [1:Red, Pink] [2:Red, Pink] [3:Red, Pink] Granulation Quality: [1:Small (1-33%)] [2:Small (1-33%)] [3:Small (1-33%)] Necrotic Amount: [1:Fascia: No] [2:Fascia: No] [3:Fat Layer (Subcutaneous Tissue): Yes] Exposed Structures: [1:Fat Layer (Subcutaneous Tissue): No Tendon: No Muscle: No Joint: No Bone: No Small (1-33%)] [2:Fat Layer (Subcutaneous Tissue): No Tendon: No Muscle: No Joint: No Bone: No Small (1-33%)] [3:Fascia: No Tendon: No Muscle: No Joint: No Bone: No Small  (1-33%)] Epithelialization: [1:Compression Therapy] [2:Compression Therapy] [3:Compression Therapy] Treatment Notes Electronic Signature(s) Signed: 05/27/2020 5:50:50 PM By: Levan Hurst RN, BSN Signed: 05/27/2020 5:51:45 PM By: Linton Ham MD Entered By: Linton Ham on 05/27/2020 10:53:12 -------------------------------------------------------------------------------- Multi-Disciplinary Care Plan Details Patient Name: Date of Service: Marvin Atlas, PA UL J. 05/27/2020 9:30 A M Medical Record Number: [id number] Patient Account Number: [id number] Date of Birth/Sex: Treating RN: 14-Nov-1960 (60 y.o. [name] Primary Care Murlean Seelye: [name] Other Clinician: Referring Abdel Effinger: Treating Korin Hartwell/Extender: Talitha Givens HN Weeks in Treatment: 1 Active Inactive Abuse / Safety / Falls / [place]  Management Nursing Diagnoses: Potential for falls Potential for injury related to falls Goals: Patient will not experience any injury related to falls Date Initiated: 05/20/2020 Target Resolution Date: 06/21/2020 Goal Status: Active Patient/caregiver will verbalize/demonstrate measures taken to  prevent injury and/or falls Date Initiated: 05/20/2020 Target Resolution Date: 06/21/2020 Goal Status: Active Interventions: Assess Activities of Daily Living upon admission and as needed Assess fall risk on admission and as needed Assess: immobility, friction, shearing, incontinence upon admission and as needed Assess impairment of mobility on admission and as needed per policy Assess personal safety and home safety (as indicated) on admission and as needed Assess self care needs on admission and as needed Provide education on fall prevention Notes: Venous Leg Ulcer Nursing Diagnoses: Actual venous Insuffiency (use after diagnosis is confirmed) Knowledge deficit related to disease process and management Goals: Patient will maintain optimal edema control Date Initiated: 05/20/2020 Target Resolution Date: 06/21/2020 Goal Status: Active Patient/caregiver will verbalize understanding of disease process and disease management Date Initiated: 05/20/2020 Target Resolution Date: 06/21/2020 Goal Status: Active Interventions: Assess peripheral edema status every visit. Compression as ordered Provide education on venous insufficiency Notes: Wound/Skin Impairment Nursing Diagnoses: Impaired tissue integrity Knowledge deficit related to smoking impact on wound healing Knowledge deficit related to ulceration/compromised skin integrity Goals: Patient will demonstrate a reduced rate of smoking or cessation of smoking Date Initiated: 05/20/2020 Target Resolution Date: 06/21/2020 Goal Status: Active Patient/caregiver will verbalize understanding of skin care regimen Date Initiated: 05/20/2020 Target Resolution Date: 06/21/2020 Goal Status: Active Ulcer/skin breakdown will have a volume reduction of 30% by week 4 Date Initiated: 05/20/2020 Target Resolution Date: 06/21/2020 Goal Status: Active Interventions: Assess patient/caregiver ability to obtain necessary supplies Assess patient/caregiver ability  to perform ulcer/skin care regimen upon admission and as needed Assess ulceration(s) every visit Provide education on smoking Provide education on ulcer and skin care Notes: Electronic Signature(s) Signed: 05/27/2020 5:50:50 PM By: Levan Hurst RN, BSN Entered By: Levan Hurst on 05/27/2020 11:02:45 -------------------------------------------------------------------------------- Pain Assessment Details Patient Name: Date of Service: Marvin Atlas, PA UL J. 05/27/2020 9:30 A M Medical Record Number: [id number] Patient Account Number: [id number] Date of Birth/Sex: Treating RN: [birth year] (60 y.o. [name] Primary Care Charnell Peplinski: Other Clinician: [name] Referring Chessa Barrasso: Treating Narciso Stoutenburg/Extender: Talitha Givens HN Weeks in Treatment: 1 Active Problems Location of Pain Severity and Description of Pain Patient Has Paino Yes Site Locations Rate the pain. Current Pain Level: 4 Pain Management and Medication Current Pain Management: Electronic Signature(s) Signed: 05/27/2020 10:55:04 AM By: Sandre Kitty Signed: 05/27/2020 5:50:50 PM By: Levan Hurst RN, BSN Entered By: Sandre Kitty on 05/27/2020 10:14:37 -------------------------------------------------------------------------------- Patient/Caregiver Education Details Patient Name: Date of Service: Marvin Atlas, PA UL J. 2/14/2022andnbsp9:30 A M Medical Record Number: [id number] Patient Account Number: [id number] Date of Birth/Gender: Treating RN: 08-06-1960 (60 y.o. [name] Primary Care Physician: [name] Other Clinician: Referring Physician: Treating Physician/Extender: Randa Spike Weeks in Treatment: 1 Education Assessment Education Provided To: Patient Education Topics Provided Venous: Methods: Explain/Verbal Responses: State content correctly Wound/Skin Impairment: Methods: Explain/Verbal Responses: State content correctly Electronic  Signature(s) Signed: 05/27/2020 5:50:50 PM By: Levan Hurst RN, BSN Entered By: Levan Hurst on 05/27/2020 11:02:59 -------------------------------------------------------------------------------- Wound Assessment Details Patient Name: Date of Service: Marvin Atlas, PA UL J. 05/27/2020 9:30 A M Medical Record Number: [id number] Patient Account Number: [id number] Date of Birth/Sex: Treating RN: [birth year] (60 y.o. [name] Primary Care Wanya Bangura: [name] Other Clinician:  Referring Shreyas Piatkowski: Treating Annitta Fifield/Extender: Lyda Perone, JO HN Weeks in Treatment: 1 Wound Status Wound Number: 1 Primary Venous Leg Ulcer Etiology: Wound Location: Right, Lateral Lower Leg Wound Open Wounding Event: Gradually Appeared Status: Date Acquired: 05/15/2019 Comorbid Anemia, Sleep Apnea, Congestive Heart Failure, Coronary Weeks Of Treatment: 1 History: Artery Disease, Osteoarthritis Clustered Wound: No Wound Measurements Length: (cm) 2.5 Width: (cm) 4.7 Depth: (cm) 0.1 Area: (cm) 9.228 Volume: (cm) 0.923 % Reduction in Area: 15.6% % Reduction in Volume: 15.6% Epithelialization: Small (1-33%) Tunneling: No Undermining: No Wound Description Classification: Full Thickness Without Exposed Support Structures Wound Margin: Distinct, outline attached Exudate Amount: Medium Exudate Type: Serosanguineous Exudate Color: red, brown Foul Odor After Cleansing: No Slough/Fibrino Yes Wound Bed Granulation Amount: Large (67-100%) Exposed Structure Granulation Quality: Red, Pink Fascia Exposed: No Necrotic Amount: Small (1-33%) Fat Layer (Subcutaneous Tissue) Exposed: No Necrotic Quality: Adherent Slough Tendon Exposed: No Muscle Exposed: No Joint Exposed: No Bone Exposed: No Treatment Notes Wound #1 (Lower Leg) Wound Laterality: Right, Lateral Cleanser Soap and Water Discharge Instruction: May shower and wash wound with dial antibacterial soap and water prior to  dressing change. Peri-Wound Care Sween Lotion (Moisturizing lotion) Discharge Instruction: Apply moisturizing lotion as directed Topical Primary Dressing KerraCel Ag Gelling Fiber Dressing, 4x5 in (silver alginate) Discharge Instruction: Apply silver alginate to wound bed as instructed Secondary Dressing ABD Pad, 8x10 Discharge Instruction: Apply over primary dressing as directed. Zetuvit Plus 4x8 in Discharge Instruction: Apply over primary dressing as directed. Secured With Compression Wrap FourPress (4 layer compression wrap) Discharge Instruction: Apply four layer compression as directed. Unna boot below knee to help secure wrap in place Compression Stockings Add-Ons Electronic Signature(s) Signed: 05/27/2020 5:25:06 PM By: Deon Pilling Signed: 05/27/2020 5:50:50 PM By: Levan Hurst RN, BSN Entered By: Deon Pilling on 05/27/2020 10:35:53 -------------------------------------------------------------------------------- Wound Assessment Details Patient Name: Date of Service: Marvin Atlas, PA UL J. 05/27/2020 9:30 A M Medical Record Number: [id number] Patient Account Number: [id number] Date of Birth/Sex: Treating RN: [birth year] (60 y.o. [name] Primary Care Camryn Quesinberry: [name] Other Clinician: Referring Opaline Reyburn: Treating Willowdean Luhmann/Extender: Talitha Givens HN Weeks in Treatment: 1 Wound Status Wound Number: 2 Primary Venous Leg Ulcer Etiology: Wound Location: Left, Anterior Lower Leg Wound Open Wounding Event: Gradually Appeared Status: Date Acquired: 05/15/2019 Comorbid Anemia, Sleep Apnea, Congestive Heart Failure, Coronary Weeks Of Treatment: 1 History: Artery Disease, Osteoarthritis Clustered Wound: No Wound Measurements Length: (cm) 0.9 Width: (cm) 1 Depth: (cm) 0.1 Area: (cm) 0.707 Volume: (cm) 0.071 % Reduction in Area: 64% % Reduction in Volume: 63.8% Epithelialization: Small (1-33%) Tunneling: No Undermining: No Wound  Description Classification: Full Thickness Without Exposed Support Structures Wound Margin: Distinct, outline attached Exudate Amount: Medium Exudate Type: Serosanguineous Exudate Color: red, brown Foul Odor After Cleansing: No Slough/Fibrino Yes Wound Bed Granulation Amount: Large (67-100%) Exposed Structure Granulation Quality: Red, Pink Fascia Exposed: No Necrotic Amount: Small (1-33%) Fat Layer (Subcutaneous Tissue) Exposed: No Necrotic Quality: Adherent Slough Tendon Exposed: No Muscle Exposed: No Joint Exposed: No Bone Exposed: No Treatment Notes Wound #2 (Lower Leg) Wound Laterality: Left, Anterior Cleanser Soap and Water Discharge Instruction: May shower and wash wound with dial antibacterial soap and water prior to dressing change. Peri-Wound Care Sween Lotion (Moisturizing lotion) Discharge Instruction: Apply moisturizing lotion as directed Topical Primary Dressing KerraCel Ag Gelling Fiber Dressing, 4x5 in (silver alginate) Discharge Instruction: Apply silver alginate to wound bed as instructed Secondary Dressing ABD Pad, 8x10 Discharge Instruction: Apply over primary  dressing as directed. Zetuvit Plus 4x8 in Discharge Instruction: Apply over primary dressing as directed. Secured With Compression Wrap FourPress (4 layer compression wrap) Discharge Instruction: Apply four layer compression as directed. Unna boot below knee to help secure wrap in place Compression Stockings Add-Ons Electronic Signature(s) Signed: 05/27/2020 5:25:06 PM By: Deon Pilling Signed: 05/27/2020 5:50:50 PM By: Levan Hurst RN, BSN Entered By: Deon Pilling on 05/27/2020 10:36:11 -------------------------------------------------------------------------------- Wound Assessment Details Patient Name: Date of Service: Marvin Atlas, PA UL J. 05/27/2020 9:30 A M Medical Record Number: [id number] Patient Account Number: [id number] Date of Birth/Sex: Treating RN: Dec 20, 1960 (60 y.o. [name] Primary Care Rivan Siordia: [name] Other Clinician: Referring Carianna Lague: Treating Lindsie Simar/Extender: Talitha Givens HN Weeks in Treatment: 1 Wound Status Wound Number: 3 Primary Venous Leg Ulcer Etiology: Wound Location: Left, Posterior Lower Leg Wound Open Wounding Event: Gradually Appeared Status: Date Acquired: 05/15/2019 Comorbid Anemia, Sleep Apnea, Congestive Heart Failure, Coronary Weeks Of Treatment: 1 History: Artery Disease, Osteoarthritis Clustered Wound: No Wound Measurements Length: (cm) 12 Width: (cm) 5 Depth: (cm) 0.1 Area: (cm) 47.124 Volume: (cm) 4.712 % Reduction in Area: 15.3% % Reduction in Volume: 15.3% Epithelialization: Small (1-33%) Tunneling: No Undermining: No Wound Description Classification: Full Thickness Without Exposed Support Structures Wound Margin: Distinct, outline attached Exudate Amount: Medium Exudate Type: Serosanguineous Exudate Color: red, brown Foul Odor After Cleansing: No Slough/Fibrino Yes Wound Bed Granulation Amount: Large (67-100%) Exposed Structure Granulation Quality: Red, Pink Fascia Exposed: No Necrotic Amount: Small (1-33%) Fat Layer (Subcutaneous Tissue) Exposed: Yes Necrotic Quality: Adherent Slough Tendon Exposed: No Muscle Exposed: No Joint Exposed: No Bone Exposed: No Treatment Notes Wound #3 (Lower Leg) Wound Laterality: Left, Posterior Cleanser Soap and Water Discharge Instruction: May shower and wash wound with dial antibacterial soap and water prior to dressing change. Peri-Wound Care Sween Lotion (Moisturizing lotion) Discharge Instruction: Apply moisturizing lotion as directed Topical Primary Dressing KerraCel Ag Gelling Fiber Dressing, 4x5 in (silver alginate) Discharge Instruction: Apply silver alginate to wound bed as instructed Secondary Dressing ABD Pad, 8x10 Discharge Instruction: Apply over primary dressing as directed. Zetuvit Plus 4x8 in Discharge  Instruction: Apply over primary dressing as directed. Secured With Compression Wrap FourPress (4 layer compression wrap) Discharge Instruction: Apply four layer compression as directed. Unna boot below knee to help secure wrap in place Compression Stockings Add-Ons Electronic Signature(s) Signed: 05/27/2020 5:25:06 PM By: Deon Pilling Signed: 05/27/2020 5:50:50 PM By: Levan Hurst RN, BSN Entered By: Deon Pilling on 05/27/2020 10:36:22 -------------------------------------------------------------------------------- Vitals Details Patient Name: Date of Service: Marvin Atlas, PA UL J. 05/27/2020 9:30 A M Medical Record Number: [id number] Patient Account Number: [id number] Date of Birth/Sex: Treating RN: 1961-01-20 (60 y.o. [name] Primary Care Ilithyia Titzer: [name] Other Clinician: Referring Rhayne Chatwin: Treating Hampton Cost/Extender: Talitha Givens HN Weeks in Treatment: 1 Vital Signs Time Taken: 10:13 Temperature (F): 97.8 Height (in): 71 Pulse (bpm): 62 Weight (lbs): 210 Respiratory Rate (breaths/min): 18 Body Mass Index (BMI): 29.3 Blood Pressure (mmHg): 98/61 Reference Range: 80 - 120 mg / dl Electronic Signature(s) Signed: 05/27/2020 10:55:04 AM By: Sandre Kitty Entered By: Sandre Kitty on 05/27/2020 10:14:29

## 2020-05-28 DIAGNOSIS — L97919 Non-pressure chronic ulcer of unspecified part of right lower leg with unspecified severity: Secondary | ICD-10-CM | POA: Diagnosis not present

## 2020-06-03 ENCOUNTER — Encounter (HOSPITAL_BASED_OUTPATIENT_CLINIC_OR_DEPARTMENT_OTHER): Payer: PPO | Admitting: Internal Medicine

## 2020-06-03 ENCOUNTER — Other Ambulatory Visit: Payer: Self-pay

## 2020-06-03 DIAGNOSIS — I87333 Chronic venous hypertension (idiopathic) with ulcer and inflammation of bilateral lower extremity: Secondary | ICD-10-CM | POA: Diagnosis not present

## 2020-06-03 DIAGNOSIS — L97812 Non-pressure chronic ulcer of other part of right lower leg with fat layer exposed: Secondary | ICD-10-CM | POA: Diagnosis not present

## 2020-06-03 DIAGNOSIS — F1111 Opioid abuse, in remission: Secondary | ICD-10-CM | POA: Diagnosis not present

## 2020-06-03 DIAGNOSIS — L97822 Non-pressure chronic ulcer of other part of left lower leg with fat layer exposed: Secondary | ICD-10-CM | POA: Diagnosis not present

## 2020-06-03 DIAGNOSIS — I872 Venous insufficiency (chronic) (peripheral): Secondary | ICD-10-CM | POA: Diagnosis not present

## 2020-06-03 NOTE — Progress Notes (Signed)
[name] ([id number]) Visit Report for 06/03/2020 Debridement Details Patient Name: Date of Service: [name], [place] J. 06/03/2020 10:30 A M Medical Record Number: [id number] Patient Account Number: [id number] Date of Birth/Sex: Treating RN: [birth year] (60 y.o. [name] Primary Care Provider: Lovette Cliche Other Clinician: Referring Provider: Treating Provider/Extender: Keturah Barre in Treatment: 2 Debridement Performed for Assessment: Wound #1 Right,Lateral Lower Leg Performed By: Physician Ricard Dillon., MD Debridement Type: Debridement Severity of Tissue Pre Debridement: Fat layer exposed Level of Consciousness (Pre-procedure): Awake and Alert Pre-procedure Verification/Time Out Yes - 11:29 Taken: Start Time: 11:29 Pain Control: Other : Benzocaine 20% T Area Debrided (L x W): otal 2.5 (cm) x 2.3 (cm) = 5.75 (cm) Tissue and other material debrided: Viable, Non-Viable, Slough, Subcutaneous, Slough Level: Skin/Subcutaneous Tissue Debridement Description: Excisional Instrument: Curette Bleeding: Minimum Hemostasis Achieved: Pressure End Time: 11:30 Procedural Pain: 5 Post Procedural Pain: 3 Response to Treatment: Procedure was tolerated well Level of Consciousness (Post- Awake and Alert procedure): Post Debridement Measurements of Total Wound Length: (cm) 2.5 Width: (cm) 2.3 Depth: (cm) 0.1 Volume: (cm) 0.452 Character of Wound/Ulcer Post Debridement: Improved Severity of Tissue Post Debridement: Fat layer exposed Post Procedure Diagnosis Same as Pre-procedure Electronic Signature(s) Signed: 06/03/2020 5:41:15 PM By: Levan Hurst RN, BSN Signed: 06/03/2020 5:55:48 PM By: Linton Ham MD Entered By: Linton Ham on 06/03/2020 12:45:40 -------------------------------------------------------------------------------- HPI Details Patient Name: Date of Service: Marvin Atlas, [place] J. 06/03/2020 10:30 A M Medical Record Number:  [id number] Patient Account Number: [id number] Date of Birth/Sex: Treating RN: Apr 24, 1960 (60 y.o. [name] Primary Care Provider: Lovette Cliche Other Clinician: Referring Provider: Treating Provider/Extender: Keturah Barre in Treatment: 2 History of Present Illness HPI Description: ADMISSION 05/20/2020 This is a 60 year old man who is not a diabetic however he has a long history of chronic venous insufficiency with wounds on his lower extremities. He went to the wound care center in [place] for a long period of time with recurrent wounds in these areas treated with compression wraps. He comes in today with wounds on his bilateral lower legs that he says have been there about a year on the right and about 6 months on the left. He went to see Dr. Oneida Alar on 01/17/2020 who noted that he had no history of DVT but does have a history of venous insufficiency wounds. They noted that he was considering laser ablation in October 2020 but he canceled the procedure. He had an episode of bleeding from varicose veins in 2019. He had repeated venous reflux study on 10/6 which showed no evidence of a DVT or SVT Noted that he had venous reflux in the right greater saphenous vein in the thigh and the calf on the right. Also . noted to have venous reflux in the right femoral vein right popliteal vein and right small saphenous vein. At the time in October they wanted the wounds to heal a bit he also also had a right groin wound which is apparently closed. Not sure about the follow-up with vein and vascular. He comes in today with some light compression wraps and collagen on the wound placed by Dr. Lilia Pro who is a Education officer, environmental in [place]. He has 2 areas on the left leg 1 anteriorly and a large area posteriorly and a smaller area on the right anterior lower leg. He has marked lower extremity edema which is nonpitting. Hemosiderin deposition on both lower legs extensively. There is  dilated venules  in his feet. Past medical history includes history of opioid abuse in remission, left nephrolithiasis, history of an ablation although I did not see this in Dr. Oneida Alar notes, congestive heart failure and anemia ABIs in our clinic were noncompressible bilaterally 05/27/2020; we admitted this patient to clinic last week. He has bilateral superficial venous insufficiency with bilateral wounds left greater than right. We put him in 4 layer compression with silver alginate. The he was previously cared for in the [facility] wound care clinic when it was open. His wounds are better with compression this week and there is certainly less swelling. 2/21; bilateral superficial venous insufficiency wounds. A cluster on the left lateral. He has very well-defined areas on the right anterior right and right lateral. We are using silver alginate and 4-layer compression Electronic Signature(s) Signed: 06/03/2020 5:55:48 PM By: Linton Ham MD Entered By: Linton Ham on 06/03/2020 12:47:12 -------------------------------------------------------------------------------- Physical Exam Details Patient Name: Date of Service: Marvin Atlas, [place] J. 06/03/2020 10:30 A M Medical Record Number: [id number] Patient Account Number: [id number] Date of Birth/Sex: Treating RN: Sep 28, 1960 (60 y.o. [name] Primary Care Provider: Lovette Cliche Other Clinician: Referring Provider: Treating Provider/Extender: Keturah Barre in Treatment: 2 Constitutional Sitting or standing Blood Pressure is within target range for patient.. Pulse regular and within target range for patient.Marland Kitchen Respirations regular, non-labored and within target range.. Temperature is normal and within the target range for the patient.Marland Kitchen Appears in no distress. Notes Wound exam; cluster of superficial wounds on the left lateral lower leg. His edema control is better there is no evidence of infection but a large areas  affected on the left On the right more well-defined wound areas anteriorly anterolaterally. They were debrided with a #5 curette. He has new areas posteriorly a cluster of 4 tiny areas. Electronic Signature(s) Signed: 06/03/2020 5:55:48 PM By: Linton Ham MD Entered By: Linton Ham on 06/03/2020 12:50:57 -------------------------------------------------------------------------------- Physician Orders Details Patient Name: Date of Service: Marvin Atlas, [place] J. 06/03/2020 10:30 A M Medical Record Number: [id number] Patient Account Number: [id number] Date of Birth/Sex: Treating RN: 1961/04/06 (60 y.o. [name] Primary Care Provider: Lovette Cliche Other Clinician: Referring Provider: Treating Provider/Extender: Keturah Barre in Treatment: 2 Verbal / Phone Orders: No Diagnosis Coding ICD-10 Coding Code Description [phone] Chronic venous hypertension (idiopathic) with ulcer and inflammation of bilateral lower extremity I89.0 Lymphedema, not elsewhere classified L97.811 Non-pressure chronic ulcer of other part of right lower leg limited to breakdown of skin L97.821 Non-pressure chronic ulcer of other part of left lower leg limited to breakdown of skin Follow-up Appointments Return Appointment in 1 week. Bathing/ Shower/ Hygiene May shower with protection but do not get wound dressing(s) wet. Edema Control - Lymphedema / SCD / Other Bilateral Lower Extremities Elevate legs to the level of the heart or above for 30 minutes daily and/or when sitting, a frequency of: - throughout the day Avoid standing for long periods of time. Exercise regularly Moisturize legs daily. Wound Treatment Wound #1 - Lower Leg Wound Laterality: Right, Lateral Cleanser: Soap and Water 1 x Per Week Discharge Instructions: May shower and wash wound with dial antibacterial soap and water prior to dressing change. Peri-Wound Care: Triamcinolone 15 (g) 1 x Per Week Discharge  Instructions: Use triamcinolone 15 (g) as directed Peri-Wound Care: Sween Lotion (Moisturizing lotion) 1 x Per Week Discharge Instructions: Apply moisturizing lotion as directed Prim Dressing: KerraCel Ag Gelling Fiber Dressing, 4x5 in (silver alginate) 1 x Per Week ary  Discharge Instructions: Apply silver alginate to wound bed as instructed Secondary Dressing: ABD Pad, 8x10 1 x Per Week Discharge Instructions: Apply over primary dressing as directed. Secondary Dressing: Zetuvit Plus 4x8 in 1 x Per Week Discharge Instructions: Apply over primary dressing as directed. Compression Wrap: FourPress (4 layer compression wrap) 1 x Per Week Discharge Instructions: Apply four layer compression as directed. Compression Wrap: Unna boot below knee to help secure wrap in place 1 x Per Week Wound #3 - Lower Leg Wound Laterality: Left, Posterior Cleanser: Soap and Water 1 x Per Week Discharge Instructions: May shower and wash wound with dial antibacterial soap and water prior to dressing change. Peri-Wound Care: Triamcinolone 15 (g) 1 x Per Week Discharge Instructions: Use triamcinolone 15 (g) as directed Peri-Wound Care: Sween Lotion (Moisturizing lotion) 1 x Per Week Discharge Instructions: Apply moisturizing lotion as directed Prim Dressing: KerraCel Ag Gelling Fiber Dressing, 4x5 in (silver alginate) 1 x Per Week ary Discharge Instructions: Apply silver alginate to wound bed as instructed Secondary Dressing: ABD Pad, 8x10 1 x Per Week Discharge Instructions: Apply over primary dressing as directed. Secondary Dressing: Zetuvit Plus 4x8 in 1 x Per Week Discharge Instructions: Apply over primary dressing as directed. Compression Wrap: FourPress (4 layer compression wrap) 1 x Per Week Discharge Instructions: Apply four layer compression as directed. Compression Wrap: Unna boot below knee to help secure wrap in place 1 x Per Week Wound #4 - Lower Leg Wound Laterality: Right, Posterior Cleanser: Soap  and Water 1 x Per Week Discharge Instructions: May shower and wash wound with dial antibacterial soap and water prior to dressing change. Peri-Wound Care: Triamcinolone 15 (g) 1 x Per Week Discharge Instructions: Use triamcinolone 15 (g) as directed Peri-Wound Care: Sween Lotion (Moisturizing lotion) 1 x Per Week Discharge Instructions: Apply moisturizing lotion as directed Prim Dressing: KerraCel Ag Gelling Fiber Dressing, 4x5 in (silver alginate) 1 x Per Week ary Discharge Instructions: Apply silver alginate to wound bed as instructed Secondary Dressing: ABD Pad, 8x10 1 x Per Week Discharge Instructions: Apply over primary dressing as directed. Secondary Dressing: Zetuvit Plus 4x8 in 1 x Per Week Discharge Instructions: Apply over primary dressing as directed. Compression Wrap: FourPress (4 layer compression wrap) 1 x Per Week Discharge Instructions: Apply four layer compression as directed. Compression Wrap: Unna boot below knee to help secure wrap in place 1 x Per [employer]) Signed: 06/03/2020 5:41:15 PM By: Levan Hurst RN, BSN Signed: 06/03/2020 5:55:48 PM By: Linton Ham MD Entered By: Levan Hurst on 06/03/2020 11:32:32 -------------------------------------------------------------------------------- Problem List Details Patient Name: Date of Service: Marvin Atlas, [place] J. 06/03/2020 10:30 A M Medical Record Number: [id number] Patient Account Number: [id number] Date of Birth/Sex: Treating RN: 07/21/1960 (60 y.o. [name] Primary Care Provider: Lovette Cliche Other Clinician: Referring Provider: Treating Provider/Extender: Keturah Barre in Treatment: 2 Active Problems ICD-10 Encounter Code Description Active Date MDM Diagnosis I87.333 Chronic venous hypertension (idiopathic) with ulcer and inflammation of 05/20/2020 No Yes bilateral lower extremity I89.0 Lymphedema, not elsewhere classified 05/20/2020 No Yes L97.811  Non-pressure chronic ulcer of other part of right lower leg limited to breakdown 05/20/2020 No Yes of skin L97.821 Non-pressure chronic ulcer of other part of left lower leg limited to breakdown 05/20/2020 No Yes of skin Inactive Problems Resolved Problems Electronic Signature(s) Signed: 06/03/2020 5:55:48 PM By: Linton Ham MD Entered By: Linton Ham on 06/03/2020 12:42:02 -------------------------------------------------------------------------------- Progress Note Details Patient Name: Date of Service: Marvin Atlas, [place] J. 06/03/2020  10:30 A M Medical Record Number: [id number] Patient Account Number: [id number] Date of Birth/Sex: Treating RN: [birth year] (60 y.o. [name] Primary Care Provider: Lovette Cliche Other Clinician: Referring Provider: Treating Provider/Extender: Keturah Barre in Treatment: 2 Subjective History of Present Illness (HPI) ADMISSION 05/20/2020 This is a 60 year old man who is not a diabetic however he has a long history of chronic venous insufficiency with wounds on his lower extremities. He went to the wound care center in [place] for a long period of time with recurrent wounds in these areas treated with compression wraps. He comes in today with wounds on his bilateral lower legs that he says have been there about a year on the right and about 6 months on the left. He went to see Dr. Oneida Alar on 01/17/2020 who noted that he had no history of DVT but does have a history of venous insufficiency wounds. They noted that he was considering laser ablation in October 2020 but he canceled the procedure. He had an episode of bleeding from varicose veins in 2019. He had repeated venous reflux study on 10/6 which showed no evidence of a DVT or SVT Noted that he had venous reflux in the right greater saphenous vein in the thigh and the calf on the right. Also . noted to have venous reflux in the right femoral vein right popliteal vein and right  small saphenous vein. At the time in October they wanted the wounds to heal a bit he also also had a right groin wound which is apparently closed. Not sure about the follow-up with vein and vascular. He comes in today with some light compression wraps and collagen on the wound placed by Dr. Lilia Pro who is a Education officer, environmental in [place]. He has 2 areas on the left leg 1 anteriorly and a large area posteriorly and a smaller area on the right anterior lower leg. He has marked lower extremity edema which is nonpitting. Hemosiderin deposition on both lower legs extensively. There is dilated venules in his feet. Past medical history includes history of opioid abuse in remission, left nephrolithiasis, history of an ablation although I did not see this in Dr. Oneida Alar notes, congestive heart failure and anemia ABIs in our clinic were noncompressible bilaterally 05/27/2020; we admitted this patient to clinic last week. He has bilateral superficial venous insufficiency with bilateral wounds left greater than right. We put him in 4 layer compression with silver alginate. The he was previously cared for in the [facility] wound care clinic when it was open. His wounds are better with compression this week and there is certainly less swelling. 2/21; bilateral superficial venous insufficiency wounds. A cluster on the left lateral. He has very well-defined areas on the right anterior right and right lateral. We are using silver alginate and 4-layer compression Objective Constitutional Sitting or standing Blood Pressure is within target range for patient.. Pulse regular and within target range for patient.Marland Kitchen Respirations regular, non-labored and within target range.. Temperature is normal and within the target range for the patient.Marland Kitchen Appears in no distress. Vitals Time Taken: 10:24 AM, Height: 71 in, Weight: 210 lbs, BMI: 29.3, Temperature: 97.5 F, Pulse: 67 bpm, Respiratory Rate: 17 breaths/min, Blood Pressure: 126/64  mmHg. General Notes: Wound exam; cluster of superficial wounds on the left lateral lower leg. His edema control is better there is no evidence of infection but a large areas affected on the left ooOn the right more well-defined wound areas anteriorly anterolaterally. They were debrided with a #  5 curette. He has new areas posteriorly a cluster of 4 tiny areas. Integumentary (Hair, Skin) Wound #1 status is Open. Original cause of wound was Gradually Appeared. The date acquired was: 05/15/2019. The wound has been in treatment 2 weeks. The wound is located on the Right,Lateral Lower Leg. The wound measures 2.5cm length x 2.3cm width x 0.1cm depth; 4.516cm^2 area and 0.452cm^3 volume. There is no tunneling or undermining noted. There is a medium amount of serosanguineous drainage noted. The wound margin is distinct with the outline attached to the wound base. There is large (67-100%) red, pink granulation within the wound bed. There is a small (1-33%) amount of necrotic tissue within the wound bed including Adherent Slough. Wound #2 status is Healed - Epithelialized. Original cause of wound was Gradually Appeared. The date acquired was: 05/15/2019. The wound has been in treatment 2 weeks. The wound is located on the Left,Anterior Lower Leg. The wound measures 0cm length x 0cm width x 0cm depth; 0cm^2 area and 0cm^3 volume. There is no tunneling or undermining noted. There is a none present amount of drainage noted. The wound margin is distinct with the outline attached to the wound base. There is no granulation within the wound bed. There is no necrotic tissue within the wound bed. Wound #3 status is Open. Original cause of wound was Gradually Appeared. The date acquired was: 05/15/2019. The wound has been in treatment 2 weeks. The wound is located on the Left,Posterior Lower Leg. The wound measures 11cm length x 5.8cm width x 0.1cm depth; 50.108cm^2 area and 5.011cm^3 volume. There is Fat Layer (Subcutaneous  Tissue) exposed. There is no tunneling or undermining noted. There is a medium amount of serosanguineous drainage noted. The wound margin is distinct with the outline attached to the wound base. There is large (67-100%) red, pink granulation within the wound bed. There is a small (1-33%) amount of necrotic tissue within the wound bed including Adherent Slough. Wound #4 status is Open. Original cause of wound was Gradually Appeared. The date acquired was: 05/30/2020. The wound is located on the Right,Posterior Lower Leg. The wound measures 2cm length x 1.5cm width x 0.2cm depth; 2.356cm^2 area and 0.471cm^3 volume. There is Fat Layer (Subcutaneous Tissue) exposed. There is no tunneling or undermining noted. There is a medium amount of serosanguineous drainage noted. The wound margin is distinct with the outline attached to the wound base. There is medium (34-66%) red, pink granulation within the wound bed. There is a medium (34-66%) amount of necrotic tissue within the wound bed including Adherent Slough. 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 There was a Excisional Skin/Subcutaneous Tissue Debridement with a total area of 5.75 sq cm performed by Ricard Dillon., MD. With the following instrument(s): Curette to remove Viable and Non-Viable tissue/material. Material removed includes Subcutaneous Tissue and Slough and after achieving pain control using Other (Benzocaine 20%). No specimens were taken. A time out was  conducted at 11:29, prior to the start of the procedure. A Minimum amount of bleeding was controlled with Pressure. The procedure was tolerated well with a  pain level of 5 throughout and a pain level of 3 following the procedure. Post Debridement Measurements: 2.5cm length x 2.3cm width x 0.1cm depth; 0.452cm^3 volume. Character of Wound/Ulcer Post Debridement is improved. Severity of Tissue Post Debridement is: Fat layer exposed. Post procedure Diagnosis Wound #1: Same as Pre-Procedure Pre-procedure diagnosis of Wound #1 is a Venous Leg Ulcer located on the Right,Lateral Lower Leg . There was a Four Layer Compression Therapy Procedure by Levan Hurst, RN. Post procedure Diagnosis Wound #1: Same as Pre-Procedure Wound #3 Pre-procedure diagnosis of Wound #3 is a Venous Leg Ulcer located on the Left,Posterior Lower Leg . There was a Four Layer Compression Therapy Procedure by Levan Hurst, RN. Post procedure Diagnosis Wound #3: Same as Pre-Procedure Wound #4 Pre-procedure diagnosis of Wound #4 is a Venous Leg Ulcer located on the Right,Posterior Lower Leg . There was a Four Layer Compression Therapy Procedure by Levan Hurst, RN. Post procedure Diagnosis Wound #4: Same as Pre-Procedure Plan Follow-up Appointments: Return Appointment in 1 week. Bathing/ Shower/ Hygiene: May shower with protection but do not get wound dressing(s) wet. Edema Control - Lymphedema / SCD / Other: Elevate legs to the level of the heart or above for 30 minutes daily and/or when sitting, a frequency of: - throughout the day Avoid standing for long periods of time. Exercise regularly Moisturize legs daily. WOUND #1: - Lower Leg Wound Laterality: Right, Lateral Cleanser: Soap and Water 1 x Per Week/ Discharge Instructions: May shower and wash wound with dial antibacterial soap and water prior to dressing change. Peri-Wound Care: Triamcinolone 15 (g) 1 x Per Week/ Discharge Instructions: Use triamcinolone 15  (g) as directed Peri-Wound Care: Sween Lotion (Moisturizing lotion) 1 x Per Week/ Discharge Instructions: Apply moisturizing lotion as directed Prim Dressing: KerraCel Ag Gelling Fiber Dressing, 4x5 in (silver alginate) 1 x Per Week/ ary Discharge Instructions: Apply silver alginate to wound bed as instructed Secondary Dressing: ABD Pad, 8x10 1 x Per Week/ Discharge Instructions: Apply over primary dressing as directed. Secondary Dressing: Zetuvit Plus 4x8 in 1 x Per Week/ Discharge Instructions: Apply over primary dressing as directed. Com pression Wrap: FourPress (4 layer compression wrap) 1 x Per Week/ Discharge Instructions: Apply four layer compression as directed. Com pression Wrap: Unna boot below knee to help secure wrap in place 1 x Per Week/ WOUND #3: - Lower Leg Wound Laterality: Left, Posterior Cleanser: Soap and Water 1 x Per Week/ Discharge Instructions: May shower and wash wound with dial antibacterial soap and water prior to dressing change. Peri-Wound Care: Triamcinolone 15 (g) 1 x Per Week/ Discharge Instructions: Use triamcinolone 15 (g) as directed Peri-Wound Care: Sween Lotion (Moisturizing lotion) 1 x Per Week/ Discharge Instructions: Apply moisturizing lotion as directed Prim Dressing: KerraCel Ag Gelling Fiber Dressing, 4x5 in (silver alginate) 1 x Per Week/ ary Discharge Instructions: Apply silver alginate to wound bed as instructed Secondary Dressing: ABD Pad, 8x10 1 x Per Week/ Discharge Instructions: Apply over primary dressing as directed. Secondary Dressing: Zetuvit Plus 4x8 in 1 x Per Week/ Discharge Instructions: Apply over primary dressing as directed. Com pression Wrap: FourPress (4 layer compression wrap) 1 x Per Week/ Discharge Instructions: Apply four layer compression as directed. Com pression Wrap: Unna boot below knee to help secure wrap in place 1 x Per Week/ WOUND #4: - Lower Leg Wound Laterality: Right, Posterior Cleanser: Soap and Water 1 x  Per Week/ Discharge Instructions: May shower and wash wound with dial antibacterial soap and water prior to dressing change.  Peri-Wound Care: Triamcinolone 15 (g) 1 x Per Week/ Discharge Instructions: Use triamcinolone 15 (g) as directed Peri-Wound Care: Sween Lotion (Moisturizing lotion) 1 x Per Week/ Discharge Instructions: Apply moisturizing lotion as directed Prim Dressing: KerraCel Ag Gelling Fiber Dressing, 4x5 in (silver alginate) 1 x Per Week/ ary Discharge Instructions: Apply silver alginate to wound bed as instructed Secondary Dressing: ABD Pad, 8x10 1 x Per Week/ Discharge Instructions: Apply over primary dressing as directed. Secondary Dressing: Zetuvit Plus 4x8 in 1 x Per Week/ Discharge Instructions: Apply over primary dressing as directed. Com pression Wrap: FourPress (4 layer compression wrap) 1 x Per Week/ Discharge Instructions: Apply four layer compression as directed. Com pression Wrap: Unna boot below knee to help secure wrap in place 1 x Per Week/ 1. I am continuing with the silver alginate under compression 2. Liberal TCA as I think there may be a large component of stasis dermatitis here. Electronic Signature(s) Signed: 06/03/2020 5:55:48 PM By: Linton Ham MD Entered By: Linton Ham on 06/03/2020 12:51:41 -------------------------------------------------------------------------------- SuperBill Details Patient Name: Date of Service: Marvin Atlas, PA UL J. 06/03/2020 Medical Record Number: [id number] Patient Account Number: [id number] Date of Birth/Sex: Treating RN: [birth year] (60 y.o. 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 Facility Procedures CPT4 Code: [id number] ( Description: 11042 - DEB SUBQ TISSUE 20 SQ CM/< ICD-10 Diagnosis Description L97.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

## 2020-06-04 NOTE — Progress Notes (Signed)
COLTIN, CASHER ([id number]) Visit Report for 06/03/2020 Arrival Information Details Patient Name: Date of Service: [name], [place] J. 06/03/2020 10:30 A M Medical Record Number: [id number] Patient Account Number: [id number] Date of Birth/Sex: Treating RN: [birth year] (60 y.o. [name], [name] Primary Care Provider: Lovette Cliche Other Clinician: Referring Provider: Treating Provider/Extender: Keturah Barre in Treatment: 2 Visit Information History Since Last Visit Added or deleted any medications: No Patient Arrived: Ambulatory Any new allergies or adverse reactions: No Arrival Time: 10:22 Had a fall or experienced change in No Accompanied By: self activities of daily living that may affect Transfer Assistance: None risk of falls: Patient Identification Verified: Yes Signs or symptoms of abuse/neglect since last visito No Secondary Verification Process Completed: Yes Hospitalized since last visit: No Patient Requires Transmission-Based Precautions: No Implantable device outside of the clinic excluding No Patient Has Alerts: Yes cellular tissue based products placed in the center Patient Alerts: ABI's R: N/C since last visit: Has Dressing in Place as Prescribed: Yes Pain Present Now: Yes Electronic Signature(s) Signed: 06/04/2020 5:43:17 PM By: Rhae Hammock RN Entered By: Rhae Hammock on 06/03/2020 10:23:01 -------------------------------------------------------------------------------- Compression Therapy Details Patient Name: Date of Service: Marvin Atlas, [place] J. 06/03/2020 10:30 A M Medical Record Number: [id number] Patient Account Number: [id number] Date of Birth/Sex: Treating RN: 1960-04-16 (60 y.o. [name] Primary Care Provider: Lovette Cliche Other Clinician: Referring Provider: Treating Provider/Extender: Keturah Barre in Treatment: 2 Compression Therapy Performed for Wound Assessment: Wound #1 Right,Lateral  Lower Leg Performed By: Clinician Levan Hurst, RN Compression Type: Four Layer Post Procedure Diagnosis Same as Pre-procedure Electronic Signature(s) Signed: 06/03/2020 5:41:15 PM By: Levan Hurst RN, BSN Entered By: Levan Hurst on 06/03/2020 11:31:37 -------------------------------------------------------------------------------- Compression Therapy Details Patient Name: Date of Service: Marvin Atlas, PA [place] J. 06/03/2020 10:30 A M Medical Record Number: [id number] Patient Account Number: [id number] Date of Birth/Sex: Treating RN: March 19, 1961 (60 y.o. [name] Primary Care Provider: Lovette Cliche Other Clinician: Referring Provider: Treating Provider/Extender: Keturah Barre in Treatment: 2 Compression Therapy Performed for Wound Assessment: Wound #3 Left,Posterior Lower Leg Performed By: Clinician Levan Hurst, RN Compression Type: Four Layer Post Procedure Diagnosis Same as Pre-procedure Electronic Signature(s) Signed: 06/03/2020 5:41:15 PM By: Levan Hurst RN, BSN Entered By: Levan Hurst on 06/03/2020 11:31:37 -------------------------------------------------------------------------------- Compression Therapy Details Patient Name: Date of Service: Marvin Atlas, PA UL J. 06/03/2020 10:30 A M Medical Record Number: [id number] Patient Account Number: [id number] Date of Birth/Sex: Treating RN: 02-16-1961 (59 y.o. [name] Primary Care Provider: Lovette Cliche Other Clinician: Referring Provider: Treating Provider/Extender: Keturah Barre in Treatment: 2 Compression Therapy Performed for Wound Assessment: Wound #4 Right,Posterior Lower Leg Performed By: Clinician Levan Hurst, RN Compression Type: Four Layer Post Procedure Diagnosis Same as Pre-procedure Electronic Signature(s) Signed: 06/03/2020 5:41:15 PM By: Levan Hurst RN, BSN Entered By: Levan Hurst on 06/03/2020  11:31:37 -------------------------------------------------------------------------------- Encounter Discharge Information Details Patient Name: Date of Service: Marvin Atlas, PA UL J. 06/03/2020 10:30 A M Medical Record Number: [id number] Patient Account Number: [id number] Date of Birth/Sex: Treating RN: March 26, 1961 (60 y.o. [name] Primary Care Provider: Lovette Cliche Other Clinician: Referring Provider: Treating Provider/Extender: Keturah Barre in Treatment: 2 Encounter Discharge Information Items Post Procedure Vitals Discharge Condition: Stable Temperature (F): 97.6 Ambulatory Status: Ambulatory Pulse (bpm): 67 Discharge Destination: Home Respiratory Rate (breaths/min): 17 Transportation: Private Auto Blood Pressure (mmHg): 126/64 Accompanied By: self Schedule Follow-up Appointment: Yes Clinical Summary  of Care: Electronic Signature(s) Signed: 06/03/2020 6:14:41 PM By: Deon Pilling Entered By: Deon Pilling on 06/03/2020 13:19:16 -------------------------------------------------------------------------------- Lower Extremity Assessment Details Patient Name: Date of Service: [name], [name] 06/03/2020 10:30 A M Medical Record Number: [id number] Patient Account Number: [id number] Date of Birth/Sex: Treating RN: 1960-07-05 (60 y.o. [name], [name] Primary Care Provider: Lovette Cliche Other Clinician: Referring Provider: Treating Provider/Extender: Keturah Barre in Treatment: 2 Edema Assessment Assessed: Shirlyn Goltz: No] Patrice Paradise: No] Edema: [Left: Yes] [Right: Yes] Calf Left: Right: Point of Measurement: From Medial Instep 37 cm 34 cm Ankle Left: Right: Point of Measurement: From Medial Instep 26 cm 24 cm Vascular Assessment Pulses: Dorsalis Pedis Palpable: [Left:Yes] [Right:Yes] Posterior Tibial Palpable: [Left:Yes] [Right:Yes] Electronic Signature(s) Signed: 06/04/2020 5:43:17 PM By: Rhae Hammock RN Entered  By: Rhae Hammock on 06/03/2020 10:39:18 -------------------------------------------------------------------------------- Multi Wound Chart Details Patient Name: Date of Service: Marvin Atlas, PA UL J. 06/03/2020 10:30 A M Medical Record Number: [id number] Patient Account Number: [id number] Date of Birth/Sex: Treating RN: Sep 26, 1960 (60 y.o. [name] Primary Care Provider: Lovette Cliche Other Clinician: Referring Provider: Treating Provider/Extender: Keturah Barre in Treatment: 2 Vital Signs Height(in): 41 Pulse(bpm): 21 Weight(lbs): 210 Blood Pressure(mmHg): 126/64 Body Mass Index(BMI): 29 Temperature(F): 97.5 Respiratory Rate(breaths/min): 17 Photos: [1:No Photos Right, Lateral Lower Leg] [2:No Photos Left, Anterior Lower Leg] [3:Left, Posterior Lower Leg] Wound Location: [1:Gradually Appeared] [2:Gradually Appeared] [3:Gradually Appeared] Wounding Event: [1:Venous Leg Ulcer] [2:Venous Leg Ulcer] [3:Venous Leg Ulcer] Primary Etiology: [1:Anemia, Sleep Apnea, Congestive] [2:Anemia, Sleep Apnea, Congestive] [3:Anemia, Sleep Apnea, Congestive] Comorbid History: [1:Heart Failure, Coronary Artery Disease, Osteoarthritis 05/15/2019] [2:Heart Failure, Coronary Artery Disease, Osteoarthritis 05/15/2019] [3:Heart Failure, Coronary Artery Disease, Osteoarthritis 05/15/2019] Date Acquired: [1:2] [2:2] [3:2] Weeks of Treatment: [1:Open] [2:Healed - Epithelialized] [3:Open] Wound Status: [1:No] [2:No] [3:No] Clustered Wound: [1:N/A] [2:N/A] [3:N/A] Clustered Quantity: [1:2.5x2.3x0.1] [2:0x0x0] [3:11x5.8x0.1] Measurements L x W x D (cm) [1:4.516] [2:0] [3:50.108] A (cm) : rea [1:0.452] [2:0] [3:5.011] Volume (cm) : [1:58.70%] [2:100.00%] [3:9.90%] % Reduction in A [1:rea: 58.60%] [2:100.00%] [3:9.90%] % Reduction in Volume: [1:Full Thickness Without Exposed] [2:Full Thickness Without Exposed] [3:Full Thickness Without Exposed] Classification: [1:Support Structures  Medium] [2:Support Structures None Present] [3:Support Structures Medium] Exudate A mount: [1:Serosanguineous] [2:N/A] [3:Serosanguineous] Exudate Type: [1:red, brown] [2:N/A] [3:red, brown] Exudate Color: [1:Distinct, outline attached] [2:Distinct, outline attached] [3:Distinct, outline attached] Wound Margin: [1:Large (67-100%)] [2:None Present (0%)] [3:Large (67-100%)] Granulation A mount: [1:Red, Pink] [2:N/A] [3:Red, Pink] Granulation Quality: [1:Small (1-33%)] [2:None Present (0%)] [3:Small (1-33%)] Necrotic A mount: [1:Fascia: No] [2:Fascia: No] [3:Fat Layer (Subcutaneous Tissue): Yes] Exposed Structures: [1:Fat Layer (Subcutaneous Tissue): No Tendon: No Muscle: No Joint: No Bone: No Small (1-33%)] [2:Fat Layer (Subcutaneous Tissue): No Tendon: No Muscle: No Joint: No Bone: No Large (67-100%)] [3:Fascia: No Tendon: No Muscle: No Joint: No Bone: No Small  (1-33%)] Epithelialization: [1:Debridement - Excisional] [2:N/A] [3:N/A] Debridement: Pre-procedure Verification/Time Out 11:29 [2:N/A] [3:N/A] Taken: [1:Other] [2:N/A] [3:N/A] Pain Control: [1:Subcutaneous, Slough] [2:N/A] [3:N/A] Tissue Debrided: [1:Skin/Subcutaneous Tissue] [2:N/A] [3:N/A] Level: [1:5.75] [2:N/A] [3:N/A] Debridement A (sq cm): [1:rea Curette] [2:N/A] [3:N/A] Instrument: [1:Minimum] [2:N/A] [3:N/A] Bleeding: [1:Pressure] [2:N/A] [3:N/A] Hemostasis A chieved: [1:5] [2:N/A] [3:N/A] Procedural Pain: [1:3] [2:N/A] [3:N/A] Post Procedural Pain: [1:Procedure was tolerated well] [2:N/A] [3:N/A] Debridement Treatment Response: [1:2.5x2.3x0.1] [2:N/A] [3:N/A] Post Debridement Measurements L x W x D (cm) [1:0.452] [2:N/A] [3:N/A] Post Debridement Volume: (cm) [1:Compression Therapy] [2:N/A] [3:Compression Therapy] Procedures Performed: [1:Debridement 4] [2:N/A] Photos: [1:No Photos Right, Posterior Lower Leg] [2:N/A N/A] [3:N/A N/A]  Wound Location: [1:Gradually Appeared] [2:N/A] [3:N/A] Wounding Event: [1:Venous Leg  Ulcer] [2:N/A] [3:N/A] Primary Etiology: [1:Anemia, Sleep Apnea, Congestive] [2:N/A] [3:N/A] Comorbid History: [1:Heart Failure, Coronary Artery Disease, Osteoarthritis 05/30/2020] [2:N/A] [3:N/A] Date Acquired: [1:0] [2:N/A] [3:N/A] Weeks of Treatment: [1:Open] [2:N/A] [3:N/A] Wound Status: [1:Yes] [2:N/A] [3:N/A] Clustered Wound: [1:3] [2:N/A] [3:N/A] Clustered Quantity: [1:2x1.5x0.2] [2:N/A] [3:N/A] Measurements L x W x D (cm) [1:2.356] [2:N/A] [3:N/A] A (cm) : rea [1:0.471] [2:N/A] [3:N/A] Volume (cm) : [1:N/A] [2:N/A] [3:N/A] % Reduction in Area: [1:N/A] [2:N/A] [3:N/A] % Reduction in Volume: [1:Full Thickness Without Exposed] [2:N/A] [3:N/A] Classification: [1:Support Structures Medium] [2:N/A] [3:N/A] Exudate Amount: [1:Serosanguineous] [2:N/A] [3:N/A] Exudate Type: [1:red, brown] [2:N/A] [3:N/A] Exudate Color: [1:Distinct, outline attached] [2:N/A] [3:N/A] Wound Margin: [1:Medium (34-66%)] [2:N/A] [3:N/A] Granulation Amount: [1:Red, Pink] [2:N/A] [3:N/A] Granulation Quality: [1:Medium (34-66%)] [2:N/A] [3:N/A] Necrotic Amount: [1:Fat Layer (Subcutaneous Tissue): Yes N/A] [3:N/A] Exposed Structures: [1:Fascia: No Tendon: No Muscle: No Joint: No Bone: No None] [2:N/A] [3:N/A] Epithelialization: [1:N/A] [2:N/A] [3:N/A] Debridement: [1:N/A] [2:N/A] [3:N/A] Pain Control: [1:N/A] [2:N/A] [3:N/A] Tissue Debrided: [1:N/A] [2:N/A] [3:N/A] Level: [1:N/A] [2:N/A] [3:N/A] Debridement A (sq cm): [1:rea N/A] [2:N/A] [3:N/A] Instrument: [1:N/A] [2:N/A] [3:N/A] Bleeding: [1:N/A] [2:N/A] [3:N/A] Hemostasis A chieved: [1:N/A] [2:N/A] [3:N/A] Procedural Pain: [1:N/A] [2:N/A] [3:N/A] Post Procedural Pain: [1:N/A] [2:N/A] [3:N/A] Debridement Treatment Response: [1:N/A] [2:N/A] [3:N/A] Post Debridement Measurements L x W x D (cm) [1:N/A] [2:N/A] [3:N/A] Post Debridement Volume: (cm) [1:Compression Therapy] [2:N/A] [3:N/A] Treatment Notes Electronic Signature(s) Signed: 06/03/2020 5:41:15  PM By: Levan Hurst RN, BSN Signed: 06/03/2020 5:55:48 PM By: Linton Ham MD Entered By: Linton Ham on 06/03/2020 12:45:28 -------------------------------------------------------------------------------- Multi-Disciplinary Care Plan Details Patient Name: Date of Service: Marvin Atlas, [place] J. 06/03/2020 10:30 A M Medical Record Number: [id number] Patient Account Number: [id number] Date of Birth/Sex: Treating RN: 1961/03/24 (60 y.o. [name] Primary Care Provider: Lovette Cliche Other Clinician: Referring Provider: Treating Provider/Extender: Keturah Barre in Treatment: 2 Multidisciplinary Care Plan reviewed with physician Active Inactive Abuse / Safety / Falls / Self Care Management Nursing Diagnoses: Potential for falls Potential for injury related to falls Goals: Patient will not experience any injury related to falls Date Initiated: 05/20/2020 Target Resolution Date: 06/21/2020 Goal Status: Active Patient/caregiver will verbalize/demonstrate measures taken to prevent injury and/or falls Date Initiated: 05/20/2020 Target Resolution Date: 06/21/2020 Goal Status: Active Interventions: Assess Activities of Daily Living upon admission and as needed Assess fall risk on admission and as needed Assess: immobility, friction, shearing, incontinence upon admission and as needed Assess impairment of mobility on admission and as needed per policy Assess personal safety and home safety (as indicated) on admission and as needed Assess self care needs on admission and as needed Provide education on fall prevention Notes: Venous Leg Ulcer Nursing Diagnoses: Actual venous Insuffiency (use after diagnosis is confirmed) Knowledge deficit related to disease process and management Goals: Patient will maintain optimal edema control Date Initiated: 05/20/2020 Target Resolution Date: 06/21/2020 Goal Status: Active Patient/caregiver will verbalize understanding of  disease process and disease management Date Initiated: 05/20/2020 Target Resolution Date: 06/21/2020 Goal Status: Active Interventions: Assess peripheral edema status every visit. Compression as ordered Provide education on venous insufficiency Notes: Wound/Skin Impairment Nursing Diagnoses: Impaired tissue integrity Knowledge deficit related to smoking impact on wound healing Knowledge deficit related to ulceration/compromised skin integrity Goals: Patient will demonstrate a reduced rate of smoking or cessation of smoking Date Initiated: 05/20/2020 Target Resolution Date: 06/21/2020 Goal Status: Active Patient/caregiver will verbalize understanding of skin care regimen Date Initiated: 05/20/2020  Target Resolution Date: 06/21/2020 Goal Status: Active Ulcer/skin breakdown will have a volume reduction of 30% by week 4 Date Initiated: 05/20/2020 Target Resolution Date: 06/21/2020 Goal Status: Active Interventions: Assess patient/caregiver ability to obtain necessary supplies Assess patient/caregiver ability to perform ulcer/skin care regimen upon admission and as needed Assess ulceration(s) every visit Provide education on smoking Provide education on ulcer and skin care Notes: Electronic Signature(s) Signed: 06/03/2020 5:41:15 PM By: Levan Hurst RN, BSN Entered By: Levan Hurst on 06/03/2020 11:24:57 -------------------------------------------------------------------------------- Pain Assessment Details Patient Name: Date of Service: Marvin Atlas, PA UL J. 06/03/2020 10:30 A M Medical Record Number: [id number] Patient Account Number: [id number] Date of Birth/Sex: Treating RN: [birth year] (60 y.o. [place] Noe Primary Care Provider: Lovette Cliche Other Clinician: Referring Provider: Treating Provider/Extender: Keturah Barre in Treatment: 2 Active Problems Location of Pain Severity and Description of Pain Patient Has Paino Yes Site Locations Pain  Location: Pain Location: Pain in Ulcers With Dressing Change: Yes Duration of the Pain. Constant / Intermittento Intermittent Rate the pain. Current Pain Level: 6 Worst Pain Level: 10 Least Pain Level: 0 Tolerable Pain Level: 6 Character of Pain Describe the Pain: Aching Pain Management and Medication Current Pain Management: Medication: Yes Cold Application: No Rest: Yes Massage: No Activity: No T.E.N.S.: No Heat Application: No Leg drop or elevation: No Is the Current Pain Management Adequate: Adequate How does your wound impact your activities of daily livingo Sleep: No Bathing: No Appetite: No Relationship With Others: No Bladder Continence: No Emotions: No Bowel Continence: No Work: No Toileting: No Drive: No Dressing: No Hobbies: No Electronic Signature(s) Signed: 06/04/2020 5:43:17 PM By: Rhae Hammock RN Entered By: Rhae Hammock on 06/03/2020 10:26:03 -------------------------------------------------------------------------------- Patient/Caregiver Education Details Patient Name: Date of Service: Marvin Atlas, PA UL J. 2/21/2022andnbsp10:30 A M Medical Record Number: [id number] Patient Account Number: [id number] Date of Birth/Gender: Treating RN: 1960/07/10 (60 y.o. [name] Primary Care Physician: Lovette Cliche Other Clinician: Referring Physician: Treating Physician/Extender: Keturah Barre in Treatment: 2 Education Assessment Education Provided To: Patient Education Topics Provided Wound/Skin Impairment: Methods: Explain/Verbal Responses: State content correctly Electronic Signature(s) Signed: 06/03/2020 5:41:15 PM By: Levan Hurst RN, BSN Entered By: Levan Hurst on 06/03/2020 11:25:15 -------------------------------------------------------------------------------- Wound Assessment Details Patient Name: Date of Service: Marvin Atlas, PA UL J. 06/03/2020 10:30 A M Medical Record Number: [id number] Patient  Account Number: [id number] Date of Birth/Sex: Treating RN: Apr 28, 1960 (60 y.o. [name], [name] Primary Care Provider: Lovette Cliche Other Clinician: Referring Provider: Treating Provider/Extender: Keturah Barre in Treatment: 2 Wound Status Wound Number: 1 Primary Venous Leg Ulcer Etiology: Wound Location: Right, Lateral Lower Leg Wound Open Wounding Event: Gradually Appeared Status: Date Acquired: 05/15/2019 Comorbid Anemia, Sleep Apnea, Congestive Heart Failure, Coronary Weeks Of Treatment: 2 History: Artery Disease, Osteoarthritis Clustered Wound: No Wound Measurements Length: (cm) 2.5 Width: (cm) 2.3 Depth: (cm) 0.1 Area: (cm) 4.516 Volume: (cm) 0.452 % Reduction in Area: 58.7% % Reduction in Volume: 58.6% Epithelialization: Small (1-33%) Tunneling: No Undermining: No Wound Description Classification: Full Thickness Without Exposed Support Structures Wound Margin: Distinct, outline attached Exudate Amount: Medium Exudate Type: Serosanguineous Exudate Color: red, brown Foul Odor After Cleansing: No Slough/Fibrino Yes Wound Bed Granulation Amount: Large (67-100%) Exposed Structure Granulation Quality: Red, Pink Fascia Exposed: No Necrotic Amount: Small (1-33%) Fat Layer (Subcutaneous Tissue) Exposed: No Necrotic Quality: Adherent Slough Tendon Exposed: No Muscle Exposed: No Joint Exposed: No Bone Exposed: No Treatment Notes Wound #1 (Lower Leg) Wound Laterality: Right, Lateral  Cleanser Soap and Water Discharge Instruction: May shower and wash wound with dial antibacterial soap and water prior to dressing change. Peri-Wound Care Triamcinolone 15 (g) Discharge Instruction: Use triamcinolone 15 (g) as directed Sween Lotion (Moisturizing lotion) Discharge Instruction: Apply moisturizing lotion as directed Topical Primary Dressing KerraCel Ag Gelling Fiber Dressing, 4x5 in (silver alginate) Discharge Instruction: Apply silver  alginate to wound bed as instructed Secondary Dressing ABD Pad, 8x10 Discharge Instruction: Apply over primary dressing as directed. Zetuvit Plus 4x8 in Discharge Instruction: Apply over primary dressing as directed. Secured With Compression Wrap FourPress (4 layer compression wrap) Discharge Instruction: Apply four layer compression as directed. Unna boot below knee to help secure wrap in place Compression Stockings Add-Ons Electronic Signature(s) Signed: 06/04/2020 5:43:17 PM By: Rhae Hammock RN Entered By: Rhae Hammock on 06/03/2020 10:36:43 -------------------------------------------------------------------------------- Wound Assessment Details Patient Name: Date of Service: Marvin Atlas, PA UL J. 06/03/2020 10:30 A M Medical Record Number: [id number] Patient Account Number: [id number] Date of Birth/Sex: Treating RN: [birth year] (60 y.o. [name], [name] Primary Care Provider: Lovette Cliche Other Clinician: Referring Provider: Treating Provider/Extender: Keturah Barre in Treatment: 2 Wound Status Wound Number: 2 Primary Venous Leg Ulcer Etiology: Wound Location: Left, Anterior Lower Leg Wound Healed - Epithelialized Wounding Event: Gradually Appeared Status: Date Acquired: 05/15/2019 Comorbid Anemia, Sleep Apnea, Congestive Heart Failure, Coronary Weeks Of Treatment: 2 History: Artery Disease, Osteoarthritis Clustered Wound: No Wound Measurements Length: (cm) Width: (cm) Depth: (cm) Area: (cm) Volume: (cm) 0 % Reduction in Area: 100% 0 % Reduction in Volume: 100% 0 Epithelialization: Large (67-100%) 0 Tunneling: No 0 Undermining: No Wound Description Classification: Full Thickness Without Exposed Support Structures Wound Margin: Distinct, outline attached Exudate Amount: None Present Foul Odor After Cleansing: No Slough/Fibrino No Wound Bed Granulation Amount: None Present (0%) Exposed Structure Necrotic Amount: None Present  (0%) Fascia Exposed: No Fat Layer (Subcutaneous Tissue) Exposed: No Tendon Exposed: No Muscle Exposed: No Joint Exposed: No Bone Exposed: No Electronic Signature(s) Signed: 06/03/2020 5:41:15 PM By: Levan Hurst RN, BSN Signed: 06/04/2020 5:43:17 PM By: Rhae Hammock RN Entered By: Levan Hurst on 06/03/2020 11:30:26 -------------------------------------------------------------------------------- Wound Assessment Details Patient Name: Date of Service: Marvin Atlas, PA UL J. 06/03/2020 10:30 A M Medical Record Number: [id number] Patient Account Number: [id number] Date of Birth/Sex: Treating RN: 08-09-1960 (60 y.o. [name], [name] Primary Care Provider: Lovette Cliche Other Clinician: Referring Provider: Treating Provider/Extender: Keturah Barre in Treatment: 2 Wound Status Wound Number: 3 Primary Venous Leg Ulcer Etiology: Wound Location: Left, Posterior Lower Leg Wound Open Wounding Event: Gradually Appeared Status: Date Acquired: 05/15/2019 Comorbid Anemia, Sleep Apnea, Congestive Heart Failure, Coronary Weeks Of Treatment: 2 History: Artery Disease, Osteoarthritis Clustered Wound: No Wound Measurements Length: (cm) 11 Width: (cm) 5.8 Depth: (cm) 0.1 Area: (cm) 50.108 Volume: (cm) 5.011 % Reduction in Area: 9.9% % Reduction in Volume: 9.9% Epithelialization: Small (1-33%) Tunneling: No Undermining: No Wound Description Classification: Full Thickness Without Exposed Support Structures Wound Margin: Distinct, outline attached Exudate Amount: Medium Exudate Type: Serosanguineous Exudate Color: red, brown Foul Odor After Cleansing: No Slough/Fibrino Yes Wound Bed Granulation Amount: Large (67-100%) Exposed Structure Granulation Quality: Red, Pink Fascia Exposed: No Necrotic Amount: Small (1-33%) Fat Layer (Subcutaneous Tissue) Exposed: Yes Necrotic Quality: Adherent Slough Tendon Exposed: No Muscle Exposed: No Joint Exposed:  No Bone Exposed: No Treatment Notes Wound #3 (Lower Leg) Wound Laterality: Left, Posterior Cleanser Soap and Water Discharge Instruction: May shower and wash wound with dial antibacterial soap and water prior  to dressing change. Peri-Wound Care Triamcinolone 15 (g) Discharge Instruction: Use triamcinolone 15 (g) as directed Sween Lotion (Moisturizing lotion) Discharge Instruction: Apply moisturizing lotion as directed Topical Primary Dressing KerraCel Ag Gelling Fiber Dressing, 4x5 in (silver alginate) Discharge Instruction: Apply silver alginate to wound bed as instructed Secondary Dressing ABD Pad, 8x10 Discharge Instruction: Apply over primary dressing as directed. Zetuvit Plus 4x8 in Discharge Instruction: Apply over primary dressing as directed. Secured With Compression Wrap FourPress (4 layer compression wrap) Discharge Instruction: Apply four layer compression as directed. Unna boot below knee to help secure wrap in place Compression Stockings Add-Ons Electronic Signature(s) Signed: 06/04/2020 5:43:17 PM By: Rhae Hammock RN Entered By: Rhae Hammock on 06/03/2020 10:36:08 -------------------------------------------------------------------------------- Wound Assessment Details Patient Name: Date of Service: Marvin Atlas, PA UL J. 06/03/2020 10:30 A M Medical Record Number: [id number] Patient Account Number: [id number] Date of Birth/Sex: Treating RN: [birth year] (60 y.o. [name], [name] Primary Care Provider: Lovette Cliche Other Clinician: Referring Provider: Treating Provider/Extender: Keturah Barre in Treatment: 2 Wound Status Wound Number: 4 Primary Venous Leg Ulcer Etiology: Wound Location: Right, Posterior Lower Leg Wound Open Wounding Event: Gradually Appeared Status: Date Acquired: 05/30/2020 Comorbid Anemia, Sleep Apnea, Congestive Heart Failure, Coronary Weeks Of Treatment: 0 History: Artery Disease,  Osteoarthritis Clustered Wound: Yes Wound Measurements Length: (cm) 2 Width: (cm) 1.5 Depth: (cm) 0.2 Clustered Quantity: 3 Area: (cm) 2.356 Volume: (cm) 0.471 % Reduction in Area: % Reduction in Volume: Epithelialization: None Tunneling: No Undermining: No Wound Description Classification: Full Thickness Without Exposed Support Structures Wound Margin: Distinct, outline attached Exudate Amount: Medium Exudate Type: Serosanguineous Exudate Color: red, brown Foul Odor After Cleansing: No Slough/Fibrino Yes Wound Bed Granulation Amount: Medium (34-66%) Exposed Structure Granulation Quality: Red, Pink Fascia Exposed: No Necrotic Amount: Medium (34-66%) Fat Layer (Subcutaneous Tissue) Exposed: Yes Necrotic Quality: Adherent Slough Tendon Exposed: No Muscle Exposed: No Joint Exposed: No Bone Exposed: No Treatment Notes Wound #4 (Lower Leg) Wound Laterality: Right, Posterior Cleanser Soap and Water Discharge Instruction: May shower and wash wound with dial antibacterial soap and water prior to dressing change. Peri-Wound Care Triamcinolone 15 (g) Discharge Instruction: Use triamcinolone 15 (g) as directed Sween Lotion (Moisturizing lotion) Discharge Instruction: Apply moisturizing lotion as directed Topical Primary Dressing KerraCel Ag Gelling Fiber Dressing, 4x5 in (silver alginate) Discharge Instruction: Apply silver alginate to wound bed as instructed Secondary Dressing ABD Pad, 8x10 Discharge Instruction: Apply over primary dressing as directed. Zetuvit Plus 4x8 in Discharge Instruction: Apply over primary dressing as directed. Secured With Compression Wrap FourPress (4 layer compression wrap) Discharge Instruction: Apply four layer compression as directed. Unna boot below knee to help secure wrap in place Compression Stockings Add-Ons Electronic Signature(s) Signed: 06/04/2020 5:43:17 PM By: Rhae Hammock RN Entered By: Rhae Hammock on 06/03/2020  10:35:44 -------------------------------------------------------------------------------- Vitals Details Patient Name: Date of Service: Marvin Atlas, PA UL J. 06/03/2020 10:30 A M Medical Record Number: [id number] Patient Account Number: [id number] Date of Birth/Sex: Treating RN: [birth year] (60 y.o. [name], [name] Primary Care Provider: Lovette Cliche Other Clinician: Referring Provider: Treating Provider/Extender: Keturah Barre in Treatment: 2 Vital Signs Time Taken: 10:24 Temperature (F): 97.5 Height (in): 71 Pulse (bpm): 67 Weight (lbs): 210 Respiratory Rate (breaths/min): 17 Body Mass Index (BMI): 29.3 Blood Pressure (mmHg): 126/64 Reference Range: 80 - 120 mg / dl Electronic Signature(s) Signed: 06/04/2020 5:43:17 PM By: Rhae Hammock RN Entered By: Rhae Hammock on 06/03/2020 10:25:36

## 2020-06-10 ENCOUNTER — Encounter (HOSPITAL_BASED_OUTPATIENT_CLINIC_OR_DEPARTMENT_OTHER): Payer: PPO | Admitting: Internal Medicine

## 2020-06-10 ENCOUNTER — Other Ambulatory Visit: Payer: Self-pay

## 2020-06-10 DIAGNOSIS — L97212 Non-pressure chronic ulcer of right calf with fat layer exposed: Secondary | ICD-10-CM | POA: Diagnosis not present

## 2020-06-10 DIAGNOSIS — I87333 Chronic venous hypertension (idiopathic) with ulcer and inflammation of bilateral lower extremity: Secondary | ICD-10-CM | POA: Diagnosis not present

## 2020-06-10 DIAGNOSIS — L97811 Non-pressure chronic ulcer of other part of right lower leg limited to breakdown of skin: Secondary | ICD-10-CM | POA: Diagnosis not present

## 2020-06-10 DIAGNOSIS — L97222 Non-pressure chronic ulcer of left calf with fat layer exposed: Secondary | ICD-10-CM | POA: Diagnosis not present

## 2020-06-10 NOTE — Progress Notes (Signed)
SHINE, MIKES ([id number]) Visit Report for 06/10/2020 HPI Details Patient Name: Date of Service: [name], [name] 06/10/2020 9:00 A M Medical Record Number: [id number] Patient Account Number: [id number] Date of Birth/Sex: Treating RN: 16-Feb-1961 (60 y.o. [name] Primary Care Provider: [name] Other Clinician: Referring Provider: Treating Provider/Extender: Talitha Givens HN Weeks in Treatment: 3 History of Present Illness HPI Description: ADMISSION 05/20/2020 This is a 60 year old man who is not a diabetic however he has a long history of chronic venous insufficiency with wounds on his lower extremities. He went to the wound care center in [place] for a long period of time with recurrent wounds in these areas treated with compression wraps. He comes in today with wounds on his bilateral lower legs that he says have been there about a year on the right and about 6 months on the left. He went to see Dr. Oneida Alar on 01/17/2020 who noted that he had no history of DVT but does have a history of venous insufficiency wounds. They noted that he was considering laser ablation in October 2020 but he canceled the procedure. He had an episode of bleeding from varicose veins in 2019. He had repeated venous reflux study on 10/6 which showed no evidence of a DVT or SVT Noted that he had venous reflux in the right greater saphenous vein in the thigh and the calf on the right. Also . noted to have venous reflux in the right femoral vein right popliteal vein and right small saphenous vein. At the time in October they wanted the wounds to heal a bit he also also had a right groin wound which is apparently closed. Not sure about the follow-up with vein and vascular. He comes in today with some light compression wraps and collagen on the wound placed by Dr. Lilia Pro who is a Education officer, environmental in [place]. He has 2 areas on the left leg 1 anteriorly and a large area posteriorly and a  smaller area on the right anterior lower leg. He has marked lower extremity edema which is nonpitting. Hemosiderin deposition on both lower legs extensively. There is dilated venules in his feet. Past medical history includes history of opioid abuse in remission, left nephrolithiasis, history of an ablation although I did not see this in Dr. Oneida Alar notes, congestive heart failure and anemia ABIs in our clinic were noncompressible bilaterally 05/27/2020; we admitted this patient to clinic last week. He has bilateral superficial venous insufficiency with bilateral wounds left greater than right. We put him in 4 layer compression with silver alginate. The he was previously cared for in the [place] wound care clinic when it was open. His wounds are better with compression this week and there is certainly less swelling. 2/21; bilateral superficial venous insufficiency wounds. A cluster on the left lateral. He has very well-defined areas on the right anterior right and right lateral. We are using silver alginate and 4-layer compression 2/28; bilateral superficial venous insufficiency wounds in the setting of significant stage III lymphedema. He has a cluster on the left which is smaller. His area on the right anterior and right lateral also look better. Electronic Signature(s) Signed: 06/10/2020 6:21:54 PM By: Linton Ham MD Entered By: Linton Ham on 06/10/2020 10:00:35 -------------------------------------------------------------------------------- Physical Exam Details Patient Name: Date of Service: Marvin Atlas, [place] UL J. 06/10/2020 9:00 A M Medical Record Number: [id number] Patient Account Number: [id number] Date of Birth/Sex: Treating RN: 05/21/1960 (60 y.o. [name] Primary Care Provider:  [name] Other Clinician: Referring Provider: Treating Provider/Extender: Talitha Givens HN Weeks in Treatment: 3 Constitutional Sitting or standing Blood Pressure is  within target range for patient.. Pulse regular and within target range for patient.Marland Kitchen Respirations regular, non-labored and within target range.. Temperature is normal and within the target range for the patient.Marland Kitchen Appears in no distress. Cardiovascular Fetal pulses are palpable. Edema control is good. Notes Wound exam; cluster of superficial wounds on the left lateral lower leg. Edema is under good control I think these are gradually getting better a lot of the most closed over. There are 2 open areas on the right lateral in close proximity 1 more anteriorly and one more posteriorly the larger is posteriorly these also looks smaller. Electronic Signature(s) Signed: 06/10/2020 6:21:54 PM By: Linton Ham MD Entered By: Linton Ham on 06/10/2020 10:01:43 -------------------------------------------------------------------------------- Physician Orders Details Patient Name: Date of Service: Marvin Atlas, PA UL J. 06/10/2020 9:00 A M Medical Record Number: [id number] Patient Account Number: [id number] Date of Birth/Sex: Treating RN: 11/12/1960 (60 y.o. [name] Primary Care Provider: [name] Other Clinician: Referring Provider: Treating Provider/Extender: Talitha Givens HN Weeks in Treatment: 3 Verbal / Phone Orders: No Diagnosis Coding ICD-10 Coding Code Description [phone] Chronic venous hypertension (idiopathic) with ulcer and inflammation of bilateral lower extremity I89.0 Lymphedema, not elsewhere classified L97.811 Non-pressure chronic ulcer of other part of right lower leg limited to breakdown of skin L97.821 Non-pressure chronic ulcer of other part of left lower leg limited to breakdown of skin Follow-up Appointments ppointment in 2 weeks. - MD visit Return A Nurse Visit: - 1 week Bathing/ Shower/ Hygiene May shower with protection but do not get wound dressing(s) wet. Edema Control - Lymphedema / SCD / Other Bilateral Lower  Extremities Elevate legs to the level of the heart or above for 30 minutes daily and/or when sitting, a frequency of: - throughout the day Avoid standing for long periods of time. Exercise regularly Moisturize legs daily. Wound Treatment Wound #1 - Lower Leg Wound Laterality: Right, Lateral Cleanser: Soap and Water 1 x Per Week Discharge Instructions: May shower and wash wound with dial antibacterial soap and water prior to dressing change. Peri-Wound Care: Triamcinolone 15 (g) 1 x Per Week Discharge Instructions: Use triamcinolone 15 (g) as directed Peri-Wound Care: Sween Lotion (Moisturizing lotion) 1 x Per Week Discharge Instructions: Apply moisturizing lotion as directed Prim Dressing: KerraCel Ag Gelling Fiber Dressing, 4x5 in (silver alginate) 1 x Per Week ary Discharge Instructions: Apply silver alginate to wound bed as instructed Secondary Dressing: ABD Pad, 8x10 1 x Per Week Discharge Instructions: Apply over primary dressing as directed. Secondary Dressing: Zetuvit Plus 4x8 in 1 x Per Week Discharge Instructions: Apply over primary dressing as directed. Compression Wrap: FourPress (4 layer compression wrap) 1 x Per Week Discharge Instructions: Apply four layer compression as directed. Compression Wrap: Unna boot below knee to help secure wrap in place 1 x Per Week Wound #3 - Lower Leg Wound Laterality: Left, Posterior Cleanser: Soap and Water 1 x Per Week Discharge Instructions: May shower and wash wound with dial antibacterial soap and water prior to dressing change. Peri-Wound Care: Triamcinolone 15 (g) 1 x Per Week Discharge Instructions: Use triamcinolone 15 (g) as directed Peri-Wound Care: Sween Lotion (Moisturizing lotion) 1 x Per Week Discharge Instructions: Apply moisturizing lotion as directed Prim Dressing: KerraCel Ag Gelling Fiber Dressing, 4x5 in (silver alginate) 1 x Per Week ary Discharge Instructions: Apply  silver alginate to wound bed as  instructed Secondary Dressing: ABD Pad, 8x10 1 x Per Week Discharge Instructions: Apply over primary dressing as directed. Secondary Dressing: Zetuvit Plus 4x8 in 1 x Per Week Discharge Instructions: Apply over primary dressing as directed. Compression Wrap: FourPress (4 layer compression wrap) 1 x Per Week Discharge Instructions: Apply four layer compression as directed. Compression Wrap: Unna boot below knee to help secure wrap in place 1 x Per Week Wound #4 - Lower Leg Wound Laterality: Right, Posterior Cleanser: Soap and Water 1 x Per Week Discharge Instructions: May shower and wash wound with dial antibacterial soap and water prior to dressing change. Peri-Wound Care: Triamcinolone 15 (g) 1 x Per Week Discharge Instructions: Use triamcinolone 15 (g) as directed Peri-Wound Care: Sween Lotion (Moisturizing lotion) 1 x Per Week Discharge Instructions: Apply moisturizing lotion as directed Prim Dressing: KerraCel Ag Gelling Fiber Dressing, 4x5 in (silver alginate) 1 x Per Week ary Discharge Instructions: Apply silver alginate to wound bed as instructed Secondary Dressing: ABD Pad, 8x10 1 x Per Week Discharge Instructions: Apply over primary dressing as directed. Secondary Dressing: Zetuvit Plus 4x8 in 1 x Per Week Discharge Instructions: Apply over primary dressing as directed. Compression Wrap: FourPress (4 layer compression wrap) 1 x Per Week Discharge Instructions: Apply four layer compression as directed. Compression Wrap: Unna boot below knee to help secure wrap in place 1 x Per [employer]) Signed: 06/10/2020 5:56:44 PM By: Levan Hurst RN, BSN Signed: 06/10/2020 6:21:54 PM By: Linton Ham MD Entered By: Levan Hurst on 06/10/2020 09:56:14 -------------------------------------------------------------------------------- Problem List Details Patient Name: Date of Service: Marvin Atlas, [place] UL J. 06/10/2020 9:00 A M Medical Record Number: [id number] Patient Account  Number: [id number] Date of Birth/Sex: Treating RN: 1960/12/02 (60 y.o. [name] Primary Care Provider: [name] Other Clinician: Referring Provider: Treating Provider/Extender: Talitha Givens HN Weeks in Treatment: 3 Active Problems ICD-10 Encounter Code Description Active Date MDM Code Description Active Date MDM Diagnosis I87.333 Chronic venous hypertension (idiopathic) with ulcer and inflammation of 05/20/2020 No Yes bilateral lower extremity I89.0 Lymphedema, not elsewhere classified 05/20/2020 No Yes L97.811 Non-pressure chronic ulcer of other part of right lower leg limited to breakdown 05/20/2020 No Yes of skin L97.821 Non-pressure chronic ulcer of other part of left lower leg limited to breakdown 05/20/2020 No Yes of skin Inactive Problems Resolved Problems Electronic Signature(s) Signed: 06/10/2020 6:21:54 PM By: Linton Ham MD Entered By: Linton Ham on 06/10/2020 09:59:44 -------------------------------------------------------------------------------- Progress Note Details Patient Name: Date of Service: Marvin Atlas, [place] UL J. 06/10/2020 9:00 A M Medical Record Number: [id number] Patient Account Number: [id number] Date of Birth/Sex: Treating RN: 02/01/1961 (60 y.o. [name] Primary Care Provider: [name] Other Clinician: Referring Provider: Treating Provider/Extender: Talitha Givens HN Weeks in Treatment: 3 Subjective History of Present Illness (HPI) ADMISSION 05/20/2020 This is a 60 year old man who is not a diabetic however he has a long history of chronic venous insufficiency with wounds on his lower extremities. He went to the wound care center in [place] for a long period of time with recurrent wounds in these areas treated with compression wraps. He comes in today with wounds on his bilateral lower legs that he says have been there about a year on the right and about 6 months on the left. He went to  see Dr. Oneida Alar on 01/17/2020 who noted that he had no history of DVT but does have a history of  venous insufficiency wounds. They noted that he was considering laser ablation in October 2020 but he canceled the procedure. He had an episode of bleeding from varicose veins in 2019. He had repeated venous reflux study on 10/6 which showed no evidence of a DVT or SVT Noted that he had venous reflux in the right greater saphenous vein in the thigh and the calf on the right. Also . noted to have venous reflux in the right femoral vein right popliteal vein and right small saphenous vein. At the time in October they wanted the wounds to heal a bit he also also had a right groin wound which is apparently closed. Not sure about the follow-up with vein and vascular. He comes in today with some light compression wraps and collagen on the wound placed by Dr. Lilia Pro who is a Education officer, environmental in [place]. He has 2 areas on the left leg 1 anteriorly and a large area posteriorly and a smaller area on the right anterior lower leg. He has marked lower extremity edema which is nonpitting. Hemosiderin deposition on both lower legs extensively. There is dilated venules in his feet. Past medical history includes history of opioid abuse in remission, left nephrolithiasis, history of an ablation although I did not see this in Dr. Oneida Alar notes, congestive heart failure and anemia ABIs in our clinic were noncompressible bilaterally 05/27/2020; we admitted this patient to clinic last week. He has bilateral superficial venous insufficiency with bilateral wounds left greater than right. We put him in 4 layer compression with silver alginate. The he was previously cared for in the [place] wound care clinic when it was open. His wounds are better with compression this week and there is certainly less swelling. 2/21; bilateral superficial venous insufficiency wounds. A cluster on the left lateral. He has very well-defined areas on the  right anterior right and right lateral. We are using silver alginate and 4-layer compression 2/28; bilateral superficial venous insufficiency wounds in the setting of significant stage III lymphedema. He has a cluster on the left which is smaller. His area on the right anterior and right lateral also look better. Objective Constitutional Sitting or standing Blood Pressure is within target range for patient.. Pulse regular and within target range for patient.Marland Kitchen Respirations regular, non-labored and within target range.. Temperature is normal and within the target range for the patient.Marland Kitchen Appears in no distress. Vitals Time Taken: 9:24 AM, Height: 71 in, Weight: 210 lbs, BMI: 29.3, Temperature: 98.2 F, Pulse: 79 bpm, Respiratory Rate: 17 breaths/min, Blood Pressure: 100/64 mmHg. Cardiovascular Fetal pulses are palpable. Edema control is good. General Notes: Wound exam; cluster of superficial wounds on the left lateral lower leg. Edema is under good control I think these are gradually getting better a lot of the most closed over. ooThere are 2 open areas on the right lateral in close proximity 1 more anteriorly and one more posteriorly the larger is posteriorly these also looks smaller. Integumentary (Hair, Skin) Wound #1 status is Open. Original cause of wound was Gradually Appeared. The date acquired was: 05/15/2019. The wound has been in treatment 3 weeks. The wound is located on the Right,Lateral Lower Leg. The wound measures 2.5cm length x 2cm width x 0.2cm depth; 3.927cm^2 area and 0.785cm^3 volume. There is no tunneling or undermining noted. There is a medium amount of serosanguineous drainage noted. The wound margin is distinct with the outline attached to the wound base. There is large (67-100%) red, pink granulation within the wound bed. There is a  small (1-33%) amount of necrotic tissue within the wound bed including Adherent Slough. Wound #3 status is Open. Original cause of wound was  Gradually Appeared. The date acquired was: 05/15/2019. The wound has been in treatment 3 weeks. The wound is located on the Left,Posterior Lower Leg. The wound measures 7cm length x 5cm width x 0.1cm depth; 27.489cm^2 area and 2.749cm^3 volume. There is Fat Layer (Subcutaneous Tissue) exposed. There is no tunneling or undermining noted. There is a medium amount of serosanguineous drainage noted. The wound margin is distinct with the outline attached to the wound base. There is large (67-100%) red, pink granulation within the wound bed. There is a small (1- 33%) amount of necrotic tissue within the wound bed including Adherent Slough. Wound #4 status is Open. Original cause of wound was Gradually Appeared. The date acquired was: 05/30/2020. The wound has been in treatment 1 weeks. The wound is located on the Right,Posterior Lower Leg. The wound measures 1cm length x 0.5cm width x 0.1cm depth; 0.393cm^2 area and 0.039cm^3 volume. There is Fat Layer (Subcutaneous Tissue) exposed. There is no tunneling or undermining noted. There is a medium amount of serosanguineous drainage noted. The wound margin is distinct with the outline attached to the wound base. There is large (67-100%) red, pink granulation within the wound bed. There is a small (1-33%) amount of necrotic tissue within the wound bed including Adherent Slough. 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 There was a Four Layer Compression Therapy Procedure by Levan Hurst, RN. Post procedure Diagnosis Wound #1: Same as  Pre-Procedure Wound #3 Pre-procedure diagnosis of Wound #3 is a Venous Leg Ulcer located on the Left,Posterior Lower Leg . There was a Four Layer Compression Therapy Procedure by Levan Hurst, RN. Post procedure Diagnosis Wound #3: Same as Pre-Procedure Wound #4 Pre-procedure diagnosis of Wound #4 is a Venous Leg Ulcer located on the Right,Posterior Lower Leg . There was a Four Layer Compression Therapy Procedure by Levan Hurst, RN. Post procedure Diagnosis Wound #4: Same as Pre-Procedure Plan Follow-up Appointments: Return Appointment in 2 weeks. - MD visit Nurse Visit: - 1 week Bathing/ Shower/ Hygiene: May shower with protection but do not get wound dressing(s) wet. Edema Control - Lymphedema / SCD / Other: Elevate legs to the level of the heart or above for 30 minutes daily and/or when sitting, a frequency of: - throughout the day Avoid standing for long periods of time. Exercise regularly Moisturize legs daily. WOUND #1: - Lower Leg Wound Laterality: Right, Lateral Cleanser: Soap and Water 1 x Per Week/ Discharge Instructions: May shower and wash wound with dial antibacterial soap and water prior to dressing change. Peri-Wound Care: Triamcinolone 15 (g) 1 x Per Week/ Discharge Instructions: Use triamcinolone 15 (g) as directed Peri-Wound Care: Sween Lotion (Moisturizing lotion) 1 x Per Week/ Discharge Instructions: Apply moisturizing lotion as directed Prim Dressing: KerraCel Ag Gelling Fiber Dressing, 4x5 in (silver alginate) 1 x Per Week/ ary Discharge Instructions: Apply silver alginate to wound bed as instructed Secondary Dressing: ABD Pad, 8x10 1 x Per Week/ Discharge Instructions: Apply over primary dressing as directed. Secondary Dressing: Zetuvit Plus 4x8  in 1 x Per Week/ Discharge Instructions: Apply over primary dressing as directed. Com pression Wrap: FourPress (4 layer compression wrap) 1 x Per Week/ Discharge Instructions: Apply four layer compression as  directed. Com pression Wrap: Unna boot below knee to help secure wrap in place 1 x Per Week/ WOUND #3: - Lower Leg Wound Laterality: Left, Posterior Cleanser: Soap and Water 1 x Per Week/ Discharge Instructions: May shower and wash wound with dial antibacterial soap and water prior to dressing change. Peri-Wound Care: Triamcinolone 15 (g) 1 x Per Week/ Discharge Instructions: Use triamcinolone 15 (g) as directed Peri-Wound Care: Sween Lotion (Moisturizing lotion) 1 x Per Week/ Discharge Instructions: Apply moisturizing lotion as directed Prim Dressing: KerraCel Ag Gelling Fiber Dressing, 4x5 in (silver alginate) 1 x Per Week/ ary Discharge Instructions: Apply silver alginate to wound bed as instructed Secondary Dressing: ABD Pad, 8x10 1 x Per Week/ Discharge Instructions: Apply over primary dressing as directed. Secondary Dressing: Zetuvit Plus 4x8 in 1 x Per Week/ Discharge Instructions: Apply over primary dressing as directed. Com pression Wrap: FourPress (4 layer compression wrap) 1 x Per Week/ Discharge Instructions: Apply four layer compression as directed. Com pression Wrap: Unna boot below knee to help secure wrap in place 1 x Per Week/ WOUND #4: - Lower Leg Wound Laterality: Right, Posterior Cleanser: Soap and Water 1 x Per Week/ Discharge Instructions: May shower and wash wound with dial antibacterial soap and water prior to dressing change. Peri-Wound Care: Triamcinolone 15 (g) 1 x Per Week/ Discharge Instructions: Use triamcinolone 15 (g) as directed Peri-Wound Care: Sween Lotion (Moisturizing lotion) 1 x Per Week/ Discharge Instructions: Apply moisturizing lotion as directed Prim Dressing: KerraCel Ag Gelling Fiber Dressing, 4x5 in (silver alginate) 1 x Per Week/ ary Discharge Instructions: Apply silver alginate to wound bed as instructed Secondary Dressing: ABD Pad, 8x10 1 x Per Week/ Discharge Instructions: Apply over primary dressing as directed. Secondary Dressing:  Zetuvit Plus 4x8 in 1 x Per Week/ Discharge Instructions: Apply over primary dressing as directed. Com pression Wrap: FourPress (4 layer compression wrap) 1 x Per Week/ Discharge Instructions: Apply four layer compression as directed. Com pression Wrap: Unna boot below knee to help secure wrap in place 1 x Per Week/ 1. We are continuing with the silver alginate under 4-layer compression. 2. Patient is going to require external compression pumps for stage III lymphedema likely secondary to chronic venous insufficiency 3. We are making good progress Electronic Signature(s) Signed: 06/10/2020 6:21:54 PM By: Linton Ham MD Entered By: Linton Ham on 06/10/2020 10:03:20 -------------------------------------------------------------------------------- SuperBill Details Patient Name: Date of Service: Marvin Atlas, PA UL J. 06/10/2020 Medical Record Number: [id number] Patient Account Number: [id number] Date of Birth/Sex: Treating RN: 11-25-1960 (60 y.o. [name] Primary Care Provider: [name] Other Clinician: Referring Provider: Treating Provider/Extender: Talitha Givens HN Weeks in Treatment: 3 Diagnosis Coding ICD-10 Codes Code Description [phone] Chronic venous hypertension (idiopathic) with ulcer and inflammation of bilateral lower extremity I89.0 Lymphedema, not elsewhere classified L97.811 Non-pressure chronic ulcer of other part of right lower leg limited to breakdown of skin L97.821 Non-pressure chronic ulcer of other part of left lower leg limited to breakdown of skin Facility Procedures CPT4: Code [id number] Description: 81 BILATERAL: Application of multi-layer venous compression system; leg (below knee), including ankle and t. Modifier: Quantity: 1 Physician Procedures : CPT4 Code Description Modifier [id number] - WC PHYS LEVEL 3 - EST PT ICD-10 Diagnosis Description L97.811 Non-pressure chronic ulcer of  other part of right lower leg  limited to breakdown of skin L97.821 Non-pressure chronic ulcer of other part of  left lower leg limited to breakdown of skin I89.0 Lymphedema, not elsewhere classified I87.333 Chronic venous hypertension (idiopathic) with ulcer and inflammation of bilateral lower extremity Quantity: 1 Electronic Signature(s) Signed: 06/10/2020 5:56:44 PM By: Levan Hurst RN, BSN Signed: 06/10/2020 6:21:54 PM By: Linton Ham MD Entered By: Levan Hurst on 06/10/2020 15:14:30

## 2020-06-11 NOTE — Progress Notes (Signed)
JAMOND, NEELS ([id number]) Visit Report for 06/10/2020 Arrival Information Details Patient Name: Date of Service: [name], [place] J. 06/10/2020 9:00 A M Medical Record Number: [id number] Patient Account Number: [id number] Date of Birth/Sex: Treating RN: 02/19/1961 (60 y.o. [name] Primary Care Kylon Philbrook: [name] Other Clinician: Referring Anyelo Mccue: Treating Viona Hosking/Extender: Talitha Givens HN Weeks in Treatment: 3 Visit Information History Since Last Visit Added or deleted any medications: No Patient Arrived: Ambulatory Any new allergies or adverse reactions: No Arrival Time: 09:23 Had a fall or experienced change in No Accompanied By: self activities of daily living that may affect Transfer Assistance: None risk of falls: Patient Identification Verified: Yes Signs or symptoms of abuse/neglect since last visito No Secondary Verification Process Completed: Yes Hospitalized since last visit: No Patient Requires Transmission-Based Precautions: No Implantable device outside of the clinic excluding No Patient Has Alerts: Yes cellular tissue based products placed in the center Patient Alerts: ABI's R: N/C since last visit: Has Dressing in Place as Prescribed: Yes Pain Present Now: No Electronic Signature(s) Signed: 06/10/2020 9:42:14 AM By: Sandre Kitty Entered By: Sandre Kitty on 06/10/2020 09:24:02 -------------------------------------------------------------------------------- Compression Therapy Details Patient Name: Date of Service: Marvin Atlas, [place] J. 06/10/2020 9:00 A M Medical Record Number: [id number] Patient Account Number: [id number] Date of Birth/Sex: Treating RN: 16-Dec-1960 (60 y.o. [name] Primary Care Neva Ramaswamy: [name] Other Clinician: Referring Tavon Corriher: Treating Colson Barco/Extender: Talitha Givens HN Weeks in Treatment: 3 Compression Therapy Performed for Wound Assessment: Wound #1  Right,Lateral Lower Leg Performed By: Clinician Levan Hurst, RN Compression Type: Four Layer Post Procedure Diagnosis Same as Pre-procedure Electronic Signature(s) Signed: 06/10/2020 5:56:44 PM By: Levan Hurst RN, BSN Entered By: Levan Hurst on 06/10/2020 09:55:52 -------------------------------------------------------------------------------- Compression Therapy Details Patient Name: Date of Service: Marvin Atlas, PA [place] J. 06/10/2020 9:00 A M Medical Record Number: [id number] Patient Account Number: [id number] Date of Birth/Sex: Treating RN: [birth year] (60 y.o. [name] Primary Care Bethanne Mule: [name] Other Clinician: Referring Alexei Doswell: Treating Gretchen Weinfeld/Extender: Talitha Givens HN Weeks in Treatment: 3 Compression Therapy Performed for Wound Assessment: Wound #3 Left,Posterior Lower Leg Performed By: Clinician Levan Hurst, RN Compression Type: Four Layer Post Procedure Diagnosis Same as Pre-procedure Electronic Signature(s) Signed: 06/10/2020 5:56:44 PM By: Levan Hurst RN, BSN Entered By: Levan Hurst on 06/10/2020 09:55:52 -------------------------------------------------------------------------------- Compression Therapy Details Patient Name: Date of Service: Marvin Atlas, PA UL J. 06/10/2020 9:00 A M Medical Record Number: [id number] Patient Account Number: [id number] Date of Birth/Sex: Treating RN: [birth year] (60 y.o. [name] Primary Care Cielo Arias: [name] Other Clinician: Referring Lizvette Lightsey: Treating Florine Sprenkle/Extender: Talitha Givens HN Weeks in Treatment: 3 Compression Therapy Performed for Wound Assessment: Wound #4 Right,Posterior Lower Leg Performed By: Clinician Levan Hurst, RN Compression Type: Four Layer Post Procedure Diagnosis Same as Pre-procedure Electronic Signature(s) Signed: 06/10/2020 5:56:44 PM By: Levan Hurst RN, BSN Entered By: Levan Hurst on 06/10/2020  09:55:52 -------------------------------------------------------------------------------- Encounter Discharge Information Details Patient Name: Date of Service: Marvin Atlas, PA UL J. 06/10/2020 9:00 A M Medical Record Number: [id number] Patient Account Number: [id number] Date of Birth/Sex: Treating RN: May 31, 1960 (60 y.o. [name] Primary Care Artem Bunte: [name] Other Clinician: Referring Azul Brumett: Treating Aimy Sweeting/Extender: Talitha Givens HN Weeks in Treatment: 3 Encounter Discharge Information Items Discharge Condition: Stable Ambulatory Status: Ambulatory Discharge Destination: Home Transportation: Private Auto Accompanied By: self Schedule Follow-up Appointment: Yes  Clinical Summary of Care: Patient Declined Electronic Signature(s) Signed: 06/11/2020 5:57:00 PM By: Baruch Gouty RN, BSN Entered By: Baruch Gouty on 06/10/2020 10:22:55 -------------------------------------------------------------------------------- Lower Extremity Assessment Details Patient Name: Date of Service: Marvin Atlas, PA UL J. 06/10/2020 9:00 A M Medical Record Number: [id number] Patient Account Number: [id number] Date of Birth/Sex: Treating RN: 11-16-1960 (60 y.o. [name] Primary Care Jace Fermin: [name] Other Clinician: Referring Glorious Flicker: Treating Markala Sitts/Extender: Talitha Givens HN Weeks in Treatment: 3 Edema Assessment Assessed: [Left: No] [Right: No] Edema: [Left: Yes] [Right: Yes] Calf Left: Right: Point of Measurement: From Medial Instep 37 cm 34 cm Ankle Left: Right: Point of Measurement: From Medial Instep 26 cm 24 cm Vascular Assessment Pulses: Dorsalis Pedis Palpable: [Left:Yes] [Right:Yes] Electronic Signature(s) Signed: 06/10/2020 5:56:44 PM By: Levan Hurst RN, BSN Entered By: Levan Hurst on 06/10/2020 09:51:01 -------------------------------------------------------------------------------- Multi Wound  Chart Details Patient Name: Date of Service: Marvin Atlas, PA UL J. 06/10/2020 9:00 A M Medical Record Number: [id number] Patient Account Number: [id number] Date of Birth/Sex: Treating RN: 1960/09/02 (60 y.o. [name] Primary Care Maverick Patman: [name] Other Clinician: Referring Edye Hainline: Treating Aissata Wilmore/Extender: Talitha Givens HN Weeks in Treatment: 3 Vital Signs Height(in): 71 Pulse(bpm): 79 Weight(lbs): 210 Blood Pressure(mmHg): 100/64 Body Mass Index(BMI): 29 Temperature(F): 98.2 Respiratory Rate(breaths/min): 17 Photos: [1:No Photos Right, Lateral Lower Leg] [3:No Photos Left, Posterior Lower Leg] [4:No Photos Right, Posterior Lower Leg] Wound Location: [1:Gradually Appeared] [3:Gradually Appeared] [4:Gradually Appeared] Wounding Event: [1:Venous Leg Ulcer] [3:Venous Leg Ulcer] [4:Venous Leg Ulcer] Primary Etiology: [1:Anemia, Sleep Apnea, Congestive] [3:Anemia, Sleep Apnea, Congestive] [4:Anemia, Sleep Apnea, Congestive] Comorbid History: [1:Heart Failure, Coronary Artery Disease, Osteoarthritis 05/15/2019] [3:Heart Failure, Coronary Artery Disease, Osteoarthritis 05/15/2019] [4:Heart Failure, Coronary Artery Disease, Osteoarthritis 05/30/2020] Date Acquired: [1:3] [3:3] [4:1] Weeks of Treatment: [1:Open] [3:Open] [4:Open] Wound Status: [1:No] [3:No] [4:Yes] Clustered Wound: [1:N/A] [3:N/A] [4:3] Clustered Quantity: [1:2.5x2x0.2] [3:7x5x0.1] [4:1x0.5x0.1] Measurements L x W x D (cm) [1:3.927] [3:27.489] [4:0.393] A (cm) : rea [1:0.785] [3:2.749] [4:0.039] Volume (cm) : [1:64.10%] [3:50.60%] [4:83.30%] % Reduction in Area: [1:28.20%] [3:50.60%] [4:91.70%] % Reduction in Volume: [1:Full Thickness Without Exposed] [3:Full Thickness Without Exposed] [4:Full Thickness Without Exposed] Classification: [1:Support Structures Medium] [3:Support Structures Medium] [4:Support Structures Medium] Exudate Amount: [1:Serosanguineous] [3:Serosanguineous]  [4:Serosanguineous] Exudate Type: [1:red, brown] [3:red, brown] [4:red, brown] Exudate Color: [1:Distinct, outline attached] [3:Distinct, outline attached] [4:Distinct, outline attached] Wound Margin: [1:Large (67-100%)] [3:Large (67-100%)] [4:Large (67-100%)] Granulation Amount: [1:Red, Pink] [3:Red, Pink] [4:Red, Pink] Granulation Quality: [1:Small (1-33%)] [3:Small (1-33%)] [4:Small (1-33%)] Necrotic Amount: [1:Fascia: No] [3:Fat Layer (Subcutaneous Tissue): Yes Fat Layer (Subcutaneous Tissue): Yes] Exposed Structures: [1:Fat Layer (Subcutaneous Tissue): No Tendon: No Muscle: No Joint: No Bone: No Small (1-33%)] [3:Fascia: No Tendon: No Muscle: No Joint: No Bone: No Medium (34-66%)] [4:Fascia: No Tendon: No Muscle: No Joint: No Bone: No Medium (34-66%)] Epithelialization: [1:Compression Therapy] [3:Compression Therapy] [4:Compression Therapy] Treatment Notes Electronic Signature(s) Signed: 06/10/2020 5:56:44 PM By: Levan Hurst RN, BSN Signed: 06/10/2020 6:21:54 PM By: Linton Ham MD Entered By: Linton Ham on 06/10/2020 09:59:52 -------------------------------------------------------------------------------- Multi-Disciplinary Care Plan Details Patient Name: Date of Service: Marvin Atlas, PA [place] J. 06/10/2020 9:00 A M Medical Record Number: [id number] Patient Account Number: [id number] Date of Birth/Sex: Treating RN: 1960/11/09 (60 y.o. [name] Primary Care Ryan Ogborn: [name] Other Clinician: Referring Zayna Toste: Treating Arin Vanosdol/Extender: Randa Spike Weeks in Treatment: 3 Multidisciplinary Care Plan reviewed with physician Active Inactive Abuse / Safety /  Falls / Self Care Management Nursing Diagnoses: Potential for falls Potential for injury related to falls Goals: Patient will not experience any injury related to falls Date Initiated: 05/20/2020 Target Resolution Date: 06/21/2020 Goal Status: Active Patient/caregiver will  verbalize/demonstrate measures taken to prevent injury and/or falls Date Initiated: 05/20/2020 Target Resolution Date: 06/21/2020 Goal Status: Active Interventions: Assess Activities of Daily Living upon admission and as needed Assess fall risk on admission and as needed Assess: immobility, friction, shearing, incontinence upon admission and as needed Assess impairment of mobility on admission and as needed per policy Assess personal safety and home safety (as indicated) on admission and as needed Assess self care needs on admission and as needed Provide education on fall prevention Notes: Venous Leg Ulcer Nursing Diagnoses: Actual venous Insuffiency (use after diagnosis is confirmed) Knowledge deficit related to disease process and management Goals: Patient will maintain optimal edema control Date Initiated: 05/20/2020 Target Resolution Date: 06/21/2020 Goal Status: Active Patient/caregiver will verbalize understanding of disease process and disease management Date Initiated: 05/20/2020 Target Resolution Date: 06/21/2020 Goal Status: Active Interventions: Assess peripheral edema status every visit. Compression as ordered Provide education on venous insufficiency Notes: Wound/Skin Impairment Nursing Diagnoses: Impaired tissue integrity Knowledge deficit related to smoking impact on wound healing Knowledge deficit related to ulceration/compromised skin integrity Goals: Patient will demonstrate a reduced rate of smoking or cessation of smoking Date Initiated: 05/20/2020 Target Resolution Date: 06/21/2020 Goal Status: Active Patient/caregiver will verbalize understanding of skin care regimen Date Initiated: 05/20/2020 Target Resolution Date: 06/21/2020 Goal Status: Active Ulcer/skin breakdown will have a volume reduction of 30% by week 4 Date Initiated: 05/20/2020 Target Resolution Date: 06/21/2020 Goal Status: Active Interventions: Assess patient/caregiver ability to obtain necessary  supplies Assess patient/caregiver ability to perform ulcer/skin care regimen upon admission and as needed Assess ulceration(s) every visit Provide education on smoking Provide education on ulcer and skin care Notes: Electronic Signature(s) Signed: 06/10/2020 5:56:44 PM By: Levan Hurst RN, BSN Entered By: Levan Hurst on 06/10/2020 09:51:51 -------------------------------------------------------------------------------- Pain Assessment Details Patient Name: Date of Service: Marvin Atlas, PA UL J. 06/10/2020 9:00 A M Medical Record Number: [id number] Patient Account Number: [id number] Date of Birth/Sex: Treating RN: 08/11/1960 (60 y.o. [name] Primary Care Judithann Villamar: [name] Other Clinician: Referring Kamora Vossler: Treating Derrian Poli/Extender: Talitha Givens HN Weeks in Treatment: 3 Active Problems Location of Pain Severity and Description of Pain Patient Has Paino No Site Locations Pain Management and Medication Current Pain Management: Electronic Signature(s) Signed: 06/10/2020 9:42:14 AM By: Sandre Kitty Signed: 06/10/2020 5:56:44 PM By: Levan Hurst RN, BSN Entered By: Sandre Kitty on 06/10/2020 09:24:21 -------------------------------------------------------------------------------- Patient/Caregiver Education Details Patient Name: Date of Service: Marvin Atlas, PA UL J. 2/28/2022andnbsp9:00 A M Medical Record Number: [id number] Patient Account Number: [id number] Date of Birth/Gender: Treating RN: 02-Mar-1961 (60 y.o. [name] Primary Care Physician: [name] Other Clinician: Referring Physician: Treating Physician/Extender: Randa Spike Weeks in Treatment: 3 Education Assessment Education Provided To: Patient Education Topics Provided Wound/Skin Impairment: Methods: Explain/Verbal Responses: State content correctly Electronic Signature(s) Signed: 06/10/2020 5:56:44 PM By: Levan Hurst RN,  BSN Entered By: Levan Hurst on 06/10/2020 09:52:16 -------------------------------------------------------------------------------- Wound Assessment Details Patient Name: Date of Service: Marvin Atlas, PA UL J. 06/10/2020 9:00 A M Medical Record Number: [id number] Patient Account Number: [id number] Date of Birth/Sex: Treating RN: 01/21/1961 (60 y.o. [name] Primary Care Kelda Azad: [name] Other Clinician: Referring Amaka Gluth: Treating Kahlen Boyde/Extender: Talitha Givens HN  Weeks in Treatment: 3 Wound Status Wound Number: 1 Primary Venous Leg Ulcer Etiology: Wound Location: Right, Lateral Lower Leg Wound Open Wounding Event: Gradually Appeared Status: Date Acquired: 05/15/2019 Comorbid Anemia, Sleep Apnea, Congestive Heart Failure, Coronary Weeks Of Treatment: 3 History: Artery Disease, Osteoarthritis Clustered Wound: No Photos Wound Measurements Length: (cm) 2.5 Width: (cm) 2 Depth: (cm) 0.2 Area: (cm) 3.927 Volume: (cm) 0.785 % Reduction in Area: 64.1% % Reduction in Volume: 28.2% Epithelialization: Small (1-33%) Tunneling: No Undermining: No Wound Description Classification: Full Thickness Without Exposed Support Structures Wound Margin: Distinct, outline attached Exudate Amount: Medium Exudate Type: Serosanguineous Exudate Color: red, brown Foul Odor After Cleansing: No Slough/Fibrino Yes Wound Bed Granulation Amount: Large (67-100%) Exposed Structure Granulation Quality: Red, Pink Fascia Exposed: No Necrotic Amount: Small (1-33%) Fat Layer (Subcutaneous Tissue) Exposed: No Necrotic Quality: Adherent Slough Tendon Exposed: No Muscle Exposed: No Joint Exposed: No Bone Exposed: No Treatment Notes Wound #1 (Lower Leg) Wound Laterality: Right, Lateral Cleanser Soap and Water Discharge Instruction: May shower and wash wound with dial antibacterial soap and water prior to dressing change. Peri-Wound Care Triamcinolone 15  (g) Discharge Instruction: Use triamcinolone 15 (g) as directed Sween Lotion (Moisturizing lotion) Discharge Instruction: Apply moisturizing lotion as directed Topical Primary Dressing KerraCel Ag Gelling Fiber Dressing, 4x5 in (silver alginate) Discharge Instruction: Apply silver alginate to wound bed as instructed Secondary Dressing ABD Pad, 8x10 Discharge Instruction: Apply over primary dressing as directed. Zetuvit Plus 4x8 in Discharge Instruction: Apply over primary dressing as directed. Secured With Compression Wrap FourPress (4 layer compression wrap) Discharge Instruction: Apply four layer compression as directed. Unna boot below knee to help secure wrap in place Compression Stockings Add-Ons Electronic Signature(s) Signed: 06/10/2020 5:56:44 PM By: Levan Hurst RN, BSN Signed: 06/11/2020 8:29:17 AM By: Sandre Kitty Previous Signature: 06/10/2020 9:42:14 AM Version By: Sandre Kitty Entered By: Sandre Kitty on 06/10/2020 15:36:46 -------------------------------------------------------------------------------- Wound Assessment Details Patient Name: Date of Service: Marvin Atlas, PA UL J. 06/10/2020 9:00 A M Medical Record Number: [id number] Patient Account Number: [id number] Date of Birth/Sex: Treating RN: 1960/11/10 (60 y.o. [name] Primary Care Colten Desroches: [name] Other Clinician: Referring Averi Cacioppo: Treating Janalynn Eder/Extender: Talitha Givens HN Weeks in Treatment: 3 Wound Status Wound Number: 3 Primary Venous Leg Ulcer Etiology: Wound Location: Left, Posterior Lower Leg Wound Open Wounding Event: Gradually Appeared Status: Date Acquired: 05/15/2019 Comorbid Anemia, Sleep Apnea, Congestive Heart Failure, Coronary Weeks Of Treatment: 3 History: Artery Disease, Osteoarthritis Clustered Wound: No Photos Wound Measurements Length: (cm) 7 Width: (cm) 5 Depth: (cm) 0.1 Area: (cm) 27.489 Volume: (cm) 2.749 Wound  Description Classification: Full Thickness Without Exposed Support Struct Wound Margin: Distinct, outline attached Exudate Amount: Medium Exudate Type: Serosanguineous Exudate Color: red, brown ures Foul Odor After Cleansing: No Slough/Fibrino Yes % Reduction in Area: 50.6% % Reduction in Volume: 50.6% Epithelialization: Medium (34-66%) Tunneling: No Undermining: No Wound Bed Granulation Amount: Large (67-100%) Exposed Structure Granulation Quality: Red, Pink Fascia Exposed: No Necrotic Amount: Small (1-33%) Fat Layer (Subcutaneous Tissue) Exposed: Yes Necrotic Quality: Adherent Slough Tendon Exposed: No Muscle Exposed: No Joint Exposed: No Bone Exposed: No Treatment Notes Wound #3 (Lower Leg) Wound Laterality: Left, Posterior Cleanser Soap and Water Discharge Instruction: May shower and wash wound with dial antibacterial soap and water prior to dressing change. Peri-Wound Care Triamcinolone 15 (g) Discharge Instruction: Use triamcinolone 15 (g) as directed Sween Lotion (Moisturizing lotion) Discharge Instruction: Apply moisturizing lotion as directed Topical Primary Dressing KerraCel Ag Gelling Fiber Dressing,  4x5 in (silver alginate) Discharge Instruction: Apply silver alginate to wound bed as instructed Secondary Dressing ABD Pad, 8x10 Discharge Instruction: Apply over primary dressing as directed. Zetuvit Plus 4x8 in Discharge Instruction: Apply over primary dressing as directed. Secured With Compression Wrap FourPress (4 layer compression wrap) Discharge Instruction: Apply four layer compression as directed. Unna boot below knee to help secure wrap in place Compression Stockings Add-Ons Electronic Signature(s) Signed: 06/10/2020 5:56:44 PM By: Levan Hurst RN, BSN Signed: 06/11/2020 8:29:17 AM By: Sandre Kitty Previous Signature: 06/10/2020 9:42:14 AM Version By: Sandre Kitty Entered By: Sandre Kitty on 06/10/2020  15:37:11 -------------------------------------------------------------------------------- Wound Assessment Details Patient Name: Date of Service: Marvin Atlas, PA UL J. 06/10/2020 9:00 A M Medical Record Number: [id number] Patient Account Number: [id number] Date of Birth/Sex: Treating RN: 12-13-1960 (60 y.o. [name] Primary Care Ovie Cornelio: [name] Other Clinician: Referring Amahri Dengel: Treating Eliette Drumwright/Extender: Talitha Givens HN Weeks in Treatment: 3 Wound Status Wound Number: 4 Primary Venous Leg Ulcer Etiology: Wound Location: Right, Posterior Lower Leg Wound Open Wounding Event: Gradually Appeared Status: Date Acquired: 05/30/2020 Comorbid Anemia, Sleep Apnea, Congestive Heart Failure, Coronary Weeks Of Treatment: 1 History: Artery Disease, Osteoarthritis Clustered Wound: Yes Photos Wound Measurements Length: (cm) 1 Width: (cm) 0.5 Depth: (cm) 0.1 Clustered Quantity: 3 Area: (cm) 0.393 Volume: (cm) 0.039 % Reduction in Area: 83.3% % Reduction in Volume: 91.7% Epithelialization: Medium (34-66%) Tunneling: No Undermining: No Wound Description Classification: Full Thickness Without Exposed Support Structures Wound Margin: Distinct, outline attached Exudate Amount: Medium Exudate Type: Serosanguineous Exudate Color: red, brown Foul Odor After Cleansing: No Slough/Fibrino Yes Wound Bed Granulation Amount: Large (67-100%) Exposed Structure Granulation Quality: Red, Pink Fascia Exposed: No Necrotic Amount: Small (1-33%) Fat Layer (Subcutaneous Tissue) Exposed: Yes Necrotic Quality: Adherent Slough Tendon Exposed: No Muscle Exposed: No Joint Exposed: No Bone Exposed: No Treatment Notes Wound #4 (Lower Leg) Wound Laterality: Right, Posterior Cleanser Soap and Water Discharge Instruction: May shower and wash wound with dial antibacterial soap and water prior to dressing change. Peri-Wound Care Triamcinolone 15 (g) Discharge  Instruction: Use triamcinolone 15 (g) as directed Sween Lotion (Moisturizing lotion) Discharge Instruction: Apply moisturizing lotion as directed Topical Primary Dressing KerraCel Ag Gelling Fiber Dressing, 4x5 in (silver alginate) Discharge Instruction: Apply silver alginate to wound bed as instructed Secondary Dressing ABD Pad, 8x10 Discharge Instruction: Apply over primary dressing as directed. Zetuvit Plus 4x8 in Discharge Instruction: Apply over primary dressing as directed. Secured With Compression Wrap FourPress (4 layer compression wrap) Discharge Instruction: Apply four layer compression as directed. Unna boot below knee to help secure wrap in place Compression Stockings Add-Ons Electronic Signature(s) Signed: 06/10/2020 5:56:44 PM By: Levan Hurst RN, BSN Signed: 06/11/2020 8:29:17 AM By: Sandre Kitty Previous Signature: 06/10/2020 9:42:14 AM Version By: Sandre Kitty Entered By: Sandre Kitty on 06/10/2020 15:36:27 -------------------------------------------------------------------------------- [place] Details Patient Name: Date of Service: Marvin Atlas, PA UL J. 06/10/2020 9:00 A M Medical Record Number: [id number] Patient Account Number: [id number] Date of Birth/Sex: Treating RN: 1961/03/09 (60 y.o. [name] Primary Care Damita Eppard: [name] Other Clinician: Referring Ketih Goodie: Treating Lydiana Milley/Extender: Talitha Givens HN Weeks in Treatment: 3 Vital Signs Time Taken: 09:24 Temperature (F): 98.2 Height (in): 71 Pulse (bpm): 79 Weight (lbs): 210 Respiratory Rate (breaths/min): 17 Body Mass Index (BMI): 29.3 Blood Pressure (mmHg): 100/64 Reference Range: 80 - 120 mg / dl Electronic Signature(s) Signed: 06/10/2020 9:42:14 AM By: Sandre Kitty Entered By: Sandre Kitty on 06/10/2020 09:24:14

## 2020-06-17 ENCOUNTER — Encounter (HOSPITAL_BASED_OUTPATIENT_CLINIC_OR_DEPARTMENT_OTHER): Payer: PPO | Attending: Internal Medicine | Admitting: Physician Assistant

## 2020-06-17 ENCOUNTER — Other Ambulatory Visit: Payer: Self-pay

## 2020-06-17 DIAGNOSIS — L97821 Non-pressure chronic ulcer of other part of left lower leg limited to breakdown of skin: Secondary | ICD-10-CM | POA: Insufficient documentation

## 2020-06-17 DIAGNOSIS — I872 Venous insufficiency (chronic) (peripheral): Secondary | ICD-10-CM | POA: Diagnosis not present

## 2020-06-17 DIAGNOSIS — I509 Heart failure, unspecified: Secondary | ICD-10-CM | POA: Diagnosis not present

## 2020-06-17 DIAGNOSIS — L97811 Non-pressure chronic ulcer of other part of right lower leg limited to breakdown of skin: Secondary | ICD-10-CM | POA: Diagnosis not present

## 2020-06-17 DIAGNOSIS — I890 Lymphedema, not elsewhere classified: Secondary | ICD-10-CM | POA: Diagnosis not present

## 2020-06-17 DIAGNOSIS — F1111 Opioid abuse, in remission: Secondary | ICD-10-CM | POA: Diagnosis not present

## 2020-06-17 DIAGNOSIS — I87333 Chronic venous hypertension (idiopathic) with ulcer and inflammation of bilateral lower extremity: Secondary | ICD-10-CM | POA: Diagnosis not present

## 2020-06-18 NOTE — Progress Notes (Signed)
BRAYON, BIELEFELD ([id number]) Visit Report for 06/17/2020 Arrival Information Details Patient Name: Date of Service: [name], Marvin [place] J. 06/17/2020 8:00 A M Medical Record Number: [id number] Patient Account Number: [id number] Date of Birth/Sex: Treating RN: 04/14/1960 (60 y.o. [name] Primary Care [name]: [name] Other Clinician: Referring Kealani Leckey: Treating Beadie Matsunaga/Extender: Wendie Chess, JO HN Weeks in Treatment: 4 Visit Information History Since Last Visit Added or deleted any medications: No Patient Arrived: Ambulatory Any new allergies or adverse reactions: No Arrival Time: 08:00 Had a fall or experienced change in No Accompanied By: self activities of daily living that may affect Transfer Assistance: None risk of falls: Patient Identification Verified: Yes Signs or symptoms of abuse/neglect since last visito No Secondary Verification Process Completed: Yes Hospitalized since last visit: No Patient Requires Transmission-Based Precautions: No Implantable device outside of the clinic excluding No Patient Has Alerts: Yes cellular tissue based products placed in the center Patient Alerts: ABI's R: N/C since last visit: Has Dressing in Place as Prescribed: Yes Pain Present Now: No Electronic Signature(s) Signed: 06/18/2020 11:11:03 AM By: Sandre Kitty Entered By: Sandre Kitty on 06/17/2020 08:01:39 -------------------------------------------------------------------------------- Compression Therapy Details Patient Name: Date of Service: Marvin Atlas, Marvin [place] J. 06/17/2020 8:00 A M Medical Record Number: [id number] Patient Account Number: [id number] Date of Birth/Sex: Treating RN: [birth year] (60 y.o. [name] Primary Care Trayce Caravello: [name] Other Clinician: Referring Tonianne Fine: Treating Wisdom Seybold/Extender: Wendie Chess, JO HN Weeks in Treatment: 4 Compression Therapy Performed for Wound Assessment: Wound #1 Right,Lateral  Lower Leg Performed By: Clinician Levan Hurst, RN Compression Type: Three Hydrologist) Signed: 06/18/2020 6:01:52 PM By: Levan Hurst RN, BSN Entered By: Levan Hurst on 06/17/2020 08:52:46 -------------------------------------------------------------------------------- Compression Therapy Details Patient Name: Date of Service: Marvin Atlas, Marvin [place] J. 06/17/2020 8:00 A M Medical Record Number: [id number] Patient Account Number: [id number] Date of Birth/Sex: Treating RN: May 24, 1960 (60 y.o. [name] Primary Care Zayin Valadez: Other Clinician: [name] Referring Deziray Nabi: Treating Forestine Macho/Extender: Wendie Chess, JO HN Weeks in Treatment: 4 Compression Therapy Performed for Wound Assessment: Wound #3 Left,Posterior Lower Leg Performed By: Clinician Levan Hurst, RN Compression Type: Three Hydrologist) Signed: 06/18/2020 6:01:52 PM By: Levan Hurst RN, BSN Entered By: Levan Hurst on 06/17/2020 08:52:46 -------------------------------------------------------------------------------- Compression Therapy Details Patient Name: Date of Service: Marvin Atlas, Marvin UL J. 06/17/2020 8:00 A M Medical Record Number: [id number] Patient Account Number: [id number] Date of Birth/Sex: Treating RN: 26-Mar-1961 (60 y.o. [name] Primary Care Gaia Gullikson: [name] Other Clinician: Referring Dereke Neumann: Treating Dametrius Sanjuan/Extender: Wendie Chess, JO HN Weeks in Treatment: 4 Compression Therapy Performed for Wound Assessment: Wound #4 Right,Posterior Lower Leg Performed By: Clinician Levan Hurst, RN Compression Type: Three Hydrologist) Signed: 06/18/2020 6:01:52 PM By: Levan Hurst RN, BSN Entered By: Levan Hurst on 06/17/2020 08:52:46 -------------------------------------------------------------------------------- Encounter Discharge Information Details Patient Name: Date of Service: Marvin Atlas,  Marvin UL J. 06/17/2020 8:00 A M Medical Record Number: [id number] Patient Account Number: [id number] Date of Birth/Sex: Treating RN: 1960-11-27 (60 y.o. [name] Primary Care Noland Pizano: [name] Other Clinician: Referring Delsie Amador: Treating Tennile Styles/Extender: Wendie Chess, JO HN Weeks in Treatment: 4 Encounter Discharge Information Items Discharge Condition: Stable Ambulatory Status: Ambulatory Discharge Destination: Home Transportation: Private Auto Accompanied By: alone Schedule Follow-up Appointment: Yes Clinical Summary of Care: Patient Declined Electronic Signature(s) Signed: 06/18/2020 6:01:52 PM By:  Levan Hurst RN, BSN Entered By: Levan Hurst on 06/17/2020 08:53:19 -------------------------------------------------------------------------------- Wound Assessment Details Patient Name: Date of Service: [name], Marvin UL J. 06/17/2020 8:00 A M Medical Record Number: [id number] Patient Account Number: [id number] Date of Birth/Sex: Treating RN: October 07, 1960 (61 y.o. [name] Primary Care Marigold Mom: [name] Other Clinician: Referring Shanyn Preisler: Treating Saria Haran/Extender: Wendie Chess, JO HN Weeks in Treatment: 4 Wound Status Wound Number: 1 Primary Etiology: Venous Leg Ulcer Wound Location: Right, Lateral Lower Leg Wound Status: Open Wounding Event: Gradually Appeared Date Acquired: 05/15/2019 Weeks Of Treatment: 4 Clustered Wound: No Wound Measurements Length: (cm) 2.5 Width: (cm) 2 Depth: (cm) 0.2 Area: (cm) 3.927 Volume: (cm) 0.785 % Reduction in Area: 64.1% % Reduction in Volume: 28.2% Wound Description Classification: Full Thickness Without Exposed Support Structur es Treatment Notes Wound #1 (Lower Leg) Wound Laterality: Right, Lateral Cleanser Soap and Water Discharge Instruction: May shower and wash wound with dial antibacterial soap and water prior to dressing change. Peri-Wound  Care Triamcinolone 15 (g) Discharge Instruction: Use triamcinolone 15 (g) as directed Sween Lotion (Moisturizing lotion) Discharge Instruction: Apply moisturizing lotion as directed Topical Primary Dressing KerraCel Ag Gelling Fiber Dressing, 4x5 in (silver alginate) Discharge Instruction: Apply silver alginate to wound bed as instructed Secondary Dressing ABD Pad, 8x10 Discharge Instruction: Apply over primary dressing as directed. Zetuvit Plus 4x8 in Discharge Instruction: Apply over primary dressing as directed. Secured With Compression Wrap FourPress (4 layer compression wrap) Discharge Instruction: Apply four layer compression as directed. Unna boot below knee to help secure wrap in place Compression Stockings Add-Ons Electronic Signature(s) Signed: 06/18/2020 11:11:03 AM By: Sandre Kitty Signed: 06/18/2020 6:01:52 PM By: Levan Hurst RN, BSN Entered By: Sandre Kitty on 06/17/2020 08:02:50 -------------------------------------------------------------------------------- Wound Assessment Details Patient Name: Date of Service: Marvin Atlas, Marvin UL J. 06/17/2020 8:00 A M Medical Record Number: [id number] Patient Account Number: [id number] Date of Birth/Sex: Treating RN: 13-Oct-1960 (60 y.o. [name] Primary Care Mammie Meras: [name] Other Clinician: Referring Jakeb Lamping: Treating Barkley Kratochvil/Extender: Wendie Chess, JO HN Weeks in Treatment: 4 Wound Status Wound Number: 3 Primary Etiology: Venous Leg Ulcer Wound Location: Left, Posterior Lower Leg Wound Status: Open Wounding Event: Gradually Appeared Date Acquired: 05/15/2019 Weeks Of Treatment: 4 Clustered Wound: No Wound Measurements Length: (cm) 7 Width: (cm) 5 Depth: (cm) 0.1 Area: (cm) 27.489 Volume: (cm) 2.749 % Reduction in Area: 50.6% % Reduction in Volume: 50.6% Wound Description Classification: Full Thickness Without Exposed Support Structur es Treatment Notes Wound #3 (Lower  Leg) Wound Laterality: Left, Posterior Cleanser Soap and Water Discharge Instruction: May shower and wash wound with dial antibacterial soap and water prior to dressing change. Peri-Wound Care Triamcinolone 15 (g) Discharge Instruction: Use triamcinolone 15 (g) as directed Sween Lotion (Moisturizing lotion) Discharge Instruction: Apply moisturizing lotion as directed Topical Primary Dressing KerraCel Ag Gelling Fiber Dressing, 4x5 in (silver alginate) Discharge Instruction: Apply silver alginate to wound bed as instructed Secondary Dressing ABD Pad, 8x10 Discharge Instruction: Apply over primary dressing as directed. Zetuvit Plus 4x8 in Discharge Instruction: Apply over primary dressing as directed. Secured With Compression Wrap FourPress (4 layer compression wrap) Discharge Instruction: Apply four layer compression as directed. Unna boot below knee to help secure wrap in place Compression Stockings Add-Ons Electronic Signature(s) Signed: 06/18/2020 11:11:03 AM By: Sandre Kitty Signed: 06/18/2020 6:01:52 PM By: Levan Hurst RN, BSN Entered By: Sandre Kitty on 06/17/2020 08:02:50 -------------------------------------------------------------------------------- Wound Assessment Details Patient Name: Date of Service: Marvin Atlas,  Marvin UL J. 06/17/2020 8:00 A M Medical Record Number: [id number] Patient Account Number: [id number] Date of Birth/Sex: Treating RN: Feb 28, 1961 (60 y.o. [name] Primary Care Garl Speigner: [name] Other Clinician: Referring Truly Stankiewicz: Treating Jarred Purtee/Extender: Wendie Chess, JO HN Weeks in Treatment: 4 Wound Status Wound Number: 4 Primary Etiology: Venous Leg Ulcer Wound Location: Right, Posterior Lower Leg Wound Status: Open Wounding Event: Gradually Appeared Date Acquired: 05/30/2020 Weeks Of Treatment: 2 Clustered Wound: Yes Wound Measurements Length: (cm) 1 Width: (cm) 0.5 Depth: (cm) 0.1 Area: (cm)  0.393 Volume: (cm) 0.039 % Reduction in Area: 83.3% % Reduction in Volume: 91.7% Wound Description Classification: Full Thickness Without Exposed Support Structur es Treatment Notes Wound #4 (Lower Leg) Wound Laterality: Right, Posterior Cleanser Soap and Water Discharge Instruction: May shower and wash wound with dial antibacterial soap and water prior to dressing change. Peri-Wound Care Triamcinolone 15 (g) Discharge Instruction: Use triamcinolone 15 (g) as directed Sween Lotion (Moisturizing lotion) Discharge Instruction: Apply moisturizing lotion as directed Topical Primary Dressing KerraCel Ag Gelling Fiber Dressing, 4x5 in (silver alginate) Discharge Instruction: Apply silver alginate to wound bed as instructed Secondary Dressing ABD Pad, 8x10 Discharge Instruction: Apply over primary dressing as directed. Zetuvit Plus 4x8 in Discharge Instruction: Apply over primary dressing as directed. Secured With Compression Wrap FourPress (4 layer compression wrap) Discharge Instruction: Apply four layer compression as directed. Unna boot below knee to help secure wrap in place Compression Stockings Add-Ons Electronic Signature(s) Signed: 06/18/2020 11:11:03 AM By: Sandre Kitty Signed: 06/18/2020 6:01:52 PM By: Levan Hurst RN, BSN Entered By: Sandre Kitty on 06/17/2020 08:02:50 -------------------------------------------------------------------------------- Vitals Details Patient Name: Date of Service: Marvin Atlas, Marvin UL J. 06/17/2020 8:00 A M Medical Record Number: [id number] Patient Account Number: [id number] Date of Birth/Sex: Treating RN: 11-08-1960 (60 y.o. [name] Primary Care Sylina Henion: [name] Other Clinician: Referring Particia Strahm: Treating James Lafalce/Extender: Wendie Chess, JO HN Weeks in Treatment: 4 Vital Signs Time Taken: 08:02 Temperature (F): 98.4 Height (in): 71 Pulse (bpm): 63 Weight (lbs): 210 Respiratory Rate  (breaths/min): 17 Body Mass Index (BMI): 29.3 Blood Pressure (mmHg): 108/65 Reference Range: 80 - 120 mg / dl Electronic Signature(s) Signed: 06/18/2020 11:11:03 AM By: Sandre Kitty Entered By: Sandre Kitty on 06/17/2020 08:02:27

## 2020-06-18 NOTE — Progress Notes (Signed)
OLYVER, HAWES ([id number]) Visit Report for 06/17/2020 SuperBill Details Patient Name: Date of Service: [name], [place] 06/17/2020 Medical Record Number: [id number] Patient Account Number: [id number] Date of Birth/Sex: Treating RN: Aug 12, 1960 (60 y.o. [name] Primary Care Provider: [name] Other Clinician: Referring Provider: Treating Provider/Extender: Domingo Madeira HN Weeks in Treatment: 4 Diagnosis Coding ICD-10 Codes Code Description [phone] Chronic venous hypertension (idiopathic) with ulcer and inflammation of bilateral lower extremity I89.0 Lymphedema, not elsewhere classified L97.811 Non-pressure chronic ulcer of other part of right lower leg limited to breakdown of skin L97.821 Non-pressure chronic ulcer of other part of left lower leg limited to breakdown of skin Facility Procedures CPT4 Description Modifier Quantity Code [id number] BILATERAL: Application of multi-layer venous compression system; leg (below knee), including ankle and 1 foot. Electronic Signature(s) Signed: 06/17/2020 1:07:55 PM By: Worthy Keeler PA-C Signed: 06/18/2020 6:01:52 PM By: Levan Hurst RN, BSN Entered By: Levan Hurst on 06/17/2020 08:53:31

## 2020-06-24 ENCOUNTER — Other Ambulatory Visit: Payer: Self-pay

## 2020-06-24 ENCOUNTER — Encounter (HOSPITAL_BASED_OUTPATIENT_CLINIC_OR_DEPARTMENT_OTHER): Payer: PPO | Admitting: Internal Medicine

## 2020-06-24 DIAGNOSIS — I87333 Chronic venous hypertension (idiopathic) with ulcer and inflammation of bilateral lower extremity: Secondary | ICD-10-CM | POA: Diagnosis not present

## 2020-06-24 DIAGNOSIS — L97811 Non-pressure chronic ulcer of other part of right lower leg limited to breakdown of skin: Secondary | ICD-10-CM | POA: Diagnosis not present

## 2020-06-24 DIAGNOSIS — L97221 Non-pressure chronic ulcer of left calf limited to breakdown of skin: Secondary | ICD-10-CM | POA: Diagnosis not present

## 2020-06-24 NOTE — Progress Notes (Signed)
HENRY, [place]SE[place] ([id number]) Visit Report for 06/24/2020 Arrival Information Details Patient Name: Date of Service: [name], Marvin [place] J. 06/24/2020 7:30 A M Medical Record Number: [id number] Patient Account Number: [id number] Date of Birth/Sex: Treating RN: 1960-07-07 (60 y.o. [name], [name] Primary Care Provider: Wallis Mart [name] Other Clinician: Referring Provider: Treating Provider/Extender: [name]: 5 Visit Information History Since Last Visit Added or deleted any medications: No Patient Arrived: Ambulatory Any new allergies or adverse reactions: No Arrival Time: 07:47 Had a fall or experienced change in No Accompanied By: self activities of daily living that may affect Transfer Assistance: None risk of falls: Patient Identification Verified: Yes Signs or symptoms of abuse/neglect since last visito No Secondary Verification Process Completed: Yes Hospitalized since last visit: No Patient Requires Transmission-Based Precautions: No Implantable device outside of the clinic excluding No Patient Has Alerts: Yes cellular tissue based products placed in the center Patient Alerts: ABI's R: N/C since last visit: Has Dressing in Place as Prescribed: Yes Pain Present Now: No Electronic Signature(s) Signed: 06/24/2020 5:40:01 PM By: Rhae Hammock RN Entered By: Rhae Hammock on 06/24/2020 07:48:33 -------------------------------------------------------------------------------- Compression Therapy Details Patient Name: Date of Service: Marvin Atlas, PA UL J. 06/24/2020 7:30 A M Medical Record Number: [id number] Patient Account Number: [id number] Date of Birth/Sex: Treating RN: 1960-05-22 (60 y.o. [name] Primary Care Provider: Wallis Mart [name] Other Clinician: Referring Provider: Treating Provider/Extender: [name]: 5 Compression Therapy Performed for Wound Assessment: Wound #1  Right,Lateral Lower Leg Performed By: Clinician Levan Hurst, RN Compression Type: Four Layer Post Procedure Diagnosis Same as Pre-procedure Electronic Signature(s) Signed: 06/24/2020 5:53:54 PM By: Lorrin Jackson Entered By: Lorrin Jackson on 06/24/2020 08:28:18 -------------------------------------------------------------------------------- Compression Therapy Details Patient Name: Date of Service: Marvin Atlas, PA UL J. 06/24/2020 7:30 A M Medical Record Number: [id number] Patient Account Number: [id number] Date of Birth/Sex: Treating RN: 20-Feb-1961 (60 y.o. [name] Primary Care Provider: Wallis Mart [name] Other Clinician: Referring Provider: Treating Provider/Extender: [name]: 5 Compression Therapy Performed for Wound Assessment: Wound #3 Left,Posterior Lower Leg Performed By: Clinician Levan Hurst, RN Compression Type: Four Layer Post Procedure Diagnosis Same as Pre-procedure Electronic Signature(s) Signed: 06/24/2020 5:53:54 PM By: Lorrin Jackson Entered By: Lorrin Jackson on 06/24/2020 08:28:18 -------------------------------------------------------------------------------- Encounter Discharge Information Details Patient Name: Date of Service: Marvin Atlas, PA UL J. 06/24/2020 7:30 A M Medical Record Number: [id number] Patient Account Number: [id number] Date of Birth/Sex: Treating RN: 05/29/1960 (60 y.o. [name] Primary Care Provider: Wallis Mart [name] Other Clinician: Referring Provider: Treating Provider/Extender: [name]: 5 Encounter Discharge Information Items Discharge Condition: Stable Ambulatory Status: Ambulatory Discharge Destination: Home Transportation: Private Auto Accompanied By: self Schedule Follow-up Appointment: Yes Clinical Summary of Care: Patient Declined Electronic Signature(s) Signed: 06/24/2020 12:16:34 PM By: Baruch Gouty RN,  BSN Entered By: Baruch Gouty on 06/24/2020 08:57:12 -------------------------------------------------------------------------------- Lower Extremity Assessment Details Patient Name: Date of Service: Marvin Atlas, PA UL J. 06/24/2020 7:30 A M Medical Record Number: [id number] Patient Account Number: [id number] Date of Birth/Sex: Treating RN: 1960-09-25 (60 y.o. [name], [name] Primary Care Provider: Wallis Mart [name] Other Clinician: Referring Provider: Treating Provider/Extender: Lyda Perone, JO [name] Weeks in [name]: 5 Edema Assessment Assessed: Shirlyn Goltz: Yes] Patrice Paradise: Yes] Edema: [Left: Yes] [Right: Yes] Calf Left: Right: Point of Measurement: From Medial Instep 36.5 cm  39 cm Ankle Left: Right: Point of Measurement: From Medial Instep 23.5 cm 23 cm Vascular Assessment Pulses: Dorsalis Pedis Palpable: [Left:Yes] [Right:Yes] Posterior Tibial Palpable: [Left:Yes] [Right:Yes] Electronic Signature(s) Signed: 06/24/2020 5:40:01 PM By: Rhae Hammock RN Entered By: Rhae Hammock on 06/24/2020 08:00:47 -------------------------------------------------------------------------------- Multi Wound Chart Details Patient Name: Date of Service: Marvin Atlas, PA UL J. 06/24/2020 7:30 A M Medical Record Number: [id number] Patient Account Number: [id number] Date of Birth/Sex: Treating RN: [birth year] (60 y.o. [name] Primary Care Provider: Wallis Mart [name] Other Clinician: Referring Provider: Treating Provider/Extender: [name]: 5 Vital Signs Height(in): 71 Pulse(bpm): 64 Weight(lbs): 210 Blood Pressure(mmHg): 112/64 Body Mass Index(BMI): 29 Temperature(F): 98.1 Respiratory Rate(breaths/min): 17 Photos: [1:No Photos Right, Lateral Lower Leg] [3:No Photos Left, Posterior Lower Leg] [4:No Photos Right, Posterior Lower Leg] Wound Location: [1:Gradually Appeared] [3:Gradually Appeared] [4:Gradually  Appeared] Wounding Event: [1:Venous Leg Ulcer] [3:Venous Leg Ulcer] [4:Venous Leg Ulcer] Primary Etiology: [1:Anemia, Sleep Apnea, Congestive] [3:Anemia, Sleep Apnea, Congestive] [4:N/A] Comorbid History: [1:Heart Failure, Coronary Artery Disease, Osteoarthritis 05/15/2019] [3:Heart Failure, Coronary Artery Disease, Osteoarthritis 05/15/2019] [4:05/30/2020] Date Acquired: [1:5] [3:5] [4:3] Weeks of [name]: [1:Open] [3:Open] [4:Healed - Epithelialized] Wound Status: [1:No] [3:No] [4:Yes] Clustered Wound: [1:1.5x1.3x0.1] [3:1x0.9x0.2] [4:0x0x0] Measurements L x W x D (cm) [1:1.532] [0:7.371] [4:0] A (cm) : rea [1:0.153] [3:0.141] [4:0] Volume (cm) : [1:86.00%] [3:98.70%] [4:100.00%] % Reduction in Area: [1:86.00%] [3:97.50%] [4:100.00%] % Reduction in Volume: [1:Full Thickness Without Exposed] [3:Full Thickness Without Exposed] [4:Full Thickness Without Exposed] Classification: [1:Support Structures Medium] [3:Support Structures Medium] [4:Support Structures N/A] Exudate Amount: [1:Serosanguineous] [3:Serosanguineous] [4:N/A] Exudate Type: [1:red, brown] [3:red, brown] [4:N/A] Exudate Color: [1:Distinct, outline attached] [3:Distinct, outline attached] [4:N/A] Wound Margin: [1:Large (67-100%)] [3:Large (67-100%)] [4:N/A] Granulation Amount: [1:Red, Pink] [3:Red, Pink] [4:N/A] Granulation Quality: [1:Small (1-33%)] [3:Small (1-33%)] [4:N/A] Necrotic Amount: [1:Fascia: No] [3:Fascia: No] [4:N/A] Exposed Structures: [1:Fat Layer (Subcutaneous Tissue): No Tendon: No Muscle: No Joint: No Bone: No Small (1-33%)] [3:Fat Layer (Subcutaneous Tissue): No Tendon: No Muscle: No Joint: No Bone: No Small (1-33%)] [4:N/A] Epithelialization: [1:Compression Therapy] [3:Compression Therapy] [4:N/A] [name] Notes Electronic Signature(s) Signed: 06/24/2020 1:09:53 PM By: Linton Ham MD Signed: 06/24/2020 6:16:37 PM By: Levan Hurst RN, BSN Entered By: Linton Ham on 06/24/2020  08:31:48 -------------------------------------------------------------------------------- Multi-Disciplinary Care Plan Details Patient Name: Date of Service: Marvin Atlas, PA UL J. 06/24/2020 7:30 A M Medical Record Number: [id number] Patient Account Number: [id number] Date of Birth/Sex: Treating RN: 20-Apr-1960 (60 y.o. [name] Primary Care Provider: Wallis Mart [name] Other Clinician: Referring Provider: Treating Provider/Extender: [name]: 5 Multidisciplinary Care Plan reviewed with physician Active Inactive Venous Leg Ulcer Nursing Diagnoses: Actual venous Insuffiency (use after diagnosis is confirmed) Knowledge deficit related to disease process and management Goals: Patient will maintain optimal edema control Date Initiated: 05/20/2020 Target Resolution Date: 08/05/2020 Goal Status: Active Patient/caregiver will verbalize understanding of disease process and disease management Date Initiated: 05/20/2020 Target Resolution Date: 08/05/2020 Goal Status: Active Interventions: Assess peripheral edema status every visit. Compression as ordered Provide education on venous insufficiency Notes: 06/24/20: Edema control and wound management ongoing. Wound/Skin Impairment Nursing Diagnoses: Impaired tissue integrity Knowledge deficit related to smoking impact on wound healing Knowledge deficit related to ulceration/compromised skin integrity Goals: Patient will demonstrate a reduced rate of smoking or cessation of smoking Date Initiated: 05/20/2020 Target Resolution Date: 08/05/2020 Goal Status: Active Patient/caregiver will verbalize understanding of skin care regimen Date Initiated: 05/20/2020 Target Resolution Date: 08/05/2020  Goal Status: Active Ulcer/skin breakdown will have a volume reduction of 30% by week 4 Date Initiated: 05/20/2020 Date Inactivated: 06/24/2020 Target Resolution Date: 06/21/2020 Goal Status: Met Ulcer/skin  breakdown will have a volume reduction of 50% by week 8 Date Initiated: 06/24/2020 Target Resolution Date: 07/22/2020 Goal Status: Active Interventions: Assess patient/caregiver ability to obtain necessary supplies Assess patient/caregiver ability to perform ulcer/skin care regimen upon admission and as needed Assess ulceration(s) every visit Provide education on smoking Provide education on ulcer and skin care Notes: Electronic Signature(s) Signed: 06/24/2020 7:54:28 AM By: Lorrin Jackson Signed: 06/24/2020 6:16:37 PM By: Levan Hurst RN, BSN Previous Signature: 06/24/2020 7:51:18 AM Version By: Lorrin Jackson Entered By: Lorrin Jackson on 06/24/2020 07:54:28 -------------------------------------------------------------------------------- Pain Assessment Details Patient Name: Date of Service: Marvin Atlas, PA UL J. 06/24/2020 7:30 A M Medical Record Number: [id number] Patient Account Number: [id number] Date of Birth/Sex: Treating RN: 1960/09/30 (60 y.o. [name], [name] Primary Care Provider: Wallis Mart [name] Other Clinician: Referring Provider: Treating Provider/Extender: [name]: 5 Active Problems Location of Pain Severity and Description of Pain Patient Has Paino No Site Locations Pain Management and Medication Current Pain Management: Electronic Signature(s) Signed: 06/24/2020 5:40:01 PM By: Rhae Hammock RN Entered By: Rhae Hammock on 06/24/2020 07:49:21 -------------------------------------------------------------------------------- Patient/Caregiver Education Details Patient Name: Date of Service: Marvin Atlas, PA UL J. 3/14/2022andnbsp7:30 A M Medical Record Number: [id number] Patient Account Number: [id number] Date of Birth/Gender: Treating RN: 1960-04-20 (60 y.o. [name] Primary Care Physician: [name] Other Clinician: Referring Physician: Treating Physician/Extender: Randa Spike Weeks in [name]: 5 Education Assessment Education Provided To: Patient Education Topics Provided Smoking and Wound Healing: Methods: Explain/Verbal, Printed Responses: State content correctly Venous: Methods: Demonstration, Explain/Verbal, Printed Responses: State content correctly Wound/Skin Impairment: Methods: Demonstration, Explain/Verbal, Printed Responses: State content correctly Electronic Signature(s) Signed: 06/24/2020 5:53:54 PM By: Lorrin Jackson Entered By: Lorrin Jackson on 06/24/2020 07:51:41 -------------------------------------------------------------------------------- Wound Assessment Details Patient Name: Date of Service: Marvin Atlas, PA UL J. 06/24/2020 7:30 A M Medical Record Number: [id number] Patient Account Number: [id number] Date of Birth/Sex: Treating RN: Jun 29, 1960 (59 y.o. [name], [name] Primary Care Provider: Wallis Mart [name] Other Clinician: Referring Provider: Treating Provider/Extender: [name]: 5 Wound Status Wound Number: 1 Primary Venous Leg Ulcer Etiology: Wound Location: Right, Lateral Lower Leg Wound Open Wounding Event: Gradually Appeared Status: Date Acquired: 05/15/2019 Comorbid Anemia, Sleep Apnea, Congestive Heart Failure, Coronary Weeks Of [name]: 5 History: Artery Disease, Osteoarthritis Clustered Wound: No Photos Wound Measurements Length: (cm) 1.5 Width: (cm) 1.3 Depth: (cm) 0.1 Area: (cm) 1.532 Volume: (cm) 0.153 % Reduction in Area: 86% % Reduction in Volume: 86% Epithelialization: Small (1-33%) Tunneling: No Undermining: No Wound Description Classification: Full Thickness Without Exposed Support Structu Wound Margin: Distinct, outline attached Exudate Amount: Medium Exudate Type: Serosanguineous Exudate Color: red, brown res Foul Odor After Cleansing: No Slough/Fibrino Yes Wound Bed Granulation Amount: Large (67-100%) Exposed Structure Granulation  Quality: Red, Pink Fascia Exposed: No Necrotic Amount: Small (1-33%) Fat Layer (Subcutaneous Tissue) Exposed: No Necrotic Quality: Adherent Slough Tendon Exposed: No Muscle Exposed: No Joint Exposed: No Bone Exposed: No [name] Notes Wound #1 (Lower Leg) Wound Laterality: Right, Lateral Cleanser Soap and Water Discharge Instruction: May shower and wash wound with dial antibacterial soap and water prior to dressing change. Peri-Wound Care Triamcinolone 15 (g) Discharge Instruction: Use triamcinolone 15 (g) as directed Sween Lotion (Moisturizing lotion) Discharge  Instruction: Apply moisturizing lotion as directed Topical Primary Dressing KerraCel Ag Gelling Fiber Dressing, 4x5 in (silver alginate) Discharge Instruction: Apply silver alginate to wound bed as instructed Secondary Dressing Secured With Compression Wrap FourPress (4 layer compression wrap) Discharge Instruction: Apply four layer compression as directed. Unna boot below knee to help secure wrap in place Compression Stockings Add-Ons Electronic Signature(s) Signed: 06/24/2020 5:39:15 PM By: Sandre Kitty Signed: 06/24/2020 5:40:01 PM By: Rhae Hammock RN Entered By: Sandre Kitty on 06/24/2020 17:37:49 -------------------------------------------------------------------------------- Wound Assessment Details Patient Name: Date of Service: Marvin Atlas, PA UL J. 06/24/2020 7:30 A M Medical Record Number: [id number] Patient Account Number: [id number] Date of Birth/Sex: Treating RN: Sep 01, 1960 (60 y.o. [name], [name] Primary Care Provider: Wallis Mart [name] Other Clinician: Referring Provider: Treating Provider/Extender: [name]: 5 Wound Status Wound Number: 3 Primary Venous Leg Ulcer Etiology: Wound Location: Left, Posterior Lower Leg Wound Open Wounding Event: Gradually Appeared Status: Date Acquired: 05/15/2019 Comorbid Anemia, Sleep Apnea, Congestive  Heart Failure, Coronary Weeks Of [name]: 5 History: Artery Disease, Osteoarthritis Clustered Wound: No Photos Wound Measurements Length: (cm) 1 Width: (cm) 0.9 Depth: (cm) 0.2 Area: (cm) 0.707 Volume: (cm) 0.141 % Reduction in Area: 98.7% % Reduction in Volume: 97.5% Epithelialization: Small (1-33%) Tunneling: No Undermining: No Wound Description Classification: Full Thickness Without Exposed Support Structures Wound Margin: Distinct, outline attached Exudate Amount: Medium Exudate Type: Serosanguineous Exudate Color: red, brown Foul Odor After Cleansing: No Slough/Fibrino Yes Wound Bed Granulation Amount: Large (67-100%) Exposed Structure Granulation Quality: Red, Pink Fascia Exposed: No Necrotic Amount: Small (1-33%) Fat Layer (Subcutaneous Tissue) Exposed: No Necrotic Quality: Adherent Slough Tendon Exposed: No Muscle Exposed: No Joint Exposed: No Bone Exposed: No [name] Notes Wound #3 (Lower Leg) Wound Laterality: Left, Posterior Cleanser Soap and Water Discharge Instruction: May shower and wash wound with dial antibacterial soap and water prior to dressing change. Peri-Wound Care Triamcinolone 15 (g) Discharge Instruction: Use triamcinolone 15 (g) as directed Sween Lotion (Moisturizing lotion) Discharge Instruction: Apply moisturizing lotion as directed Topical Primary Dressing KerraCel Ag Gelling Fiber Dressing, 4x5 in (silver alginate) Discharge Instruction: Apply silver alginate to wound bed as instructed Secondary Dressing Secured With Compression Wrap FourPress (4 layer compression wrap) Discharge Instruction: Apply four layer compression as directed. Unna boot below knee to help secure wrap in place Compression Stockings Add-Ons Electronic Signature(s) Signed: 06/24/2020 5:39:15 PM By: Sandre Kitty Signed: 06/24/2020 5:40:01 PM By: Rhae Hammock RN Entered By: Sandre Kitty on 06/24/2020  17:38:08 -------------------------------------------------------------------------------- Wound Assessment Details Patient Name: Date of Service: Marvin Atlas, PA UL J. 06/24/2020 7:30 A M Medical Record Number: [id number] Patient Account Number: [id number] Date of Birth/Sex: Treating RN: 01/05/1961 (60 y.o. [name], [name] Primary Care Provider: Wallis Mart [name] Other Clinician: Referring Provider: Treating Provider/Extender: Lyda Perone, JO [name] Weeks in [name]: 5 Wound Status Wound Number: 4 Primary Etiology: Venous Leg Ulcer Wound Location: Right, Posterior Lower Leg Wound Status: Healed - Epithelialized Wounding Event: Gradually Appeared Date Acquired: 05/30/2020 Weeks Of [name]: 3 Clustered Wound: Yes Wound Measurements Length: (cm) Width: (cm) Depth: (cm) Area: (cm) Volume: (cm) 0 % Reduction in Area: 100% 0 % Reduction in Volume: 100% 0 0 0 Wound Description Classification: Full Thickness Without Exposed Support Structur es [name] Notes Wound #4 (Lower Leg) Wound Laterality: Right, Posterior Cleanser Peri-Wound Care Topical Primary Dressing Secondary Dressing Secured With Compression Wrap Compression Stockings Add-Ons Electronic Signature(s) Signed: 06/24/2020 5:40:01 PM By: Rhae Hammock RN Entered By:  Rhae Hammock on 06/24/2020 08:04:15 -------------------------------------------------------------------------------- Vitals Details Patient Name: Date of Service: [name], Marvin UL J. 06/24/2020 7:30 A M Medical Record Number: [id number] Patient Account Number: [id number] Date of Birth/Sex: Treating RN: [birth year] (60 y.o. [name], [name] Primary Care Provider: Wallis Mart [name] Other Clinician: Referring Provider: Treating Provider/Extender: [name]: 5 Vital Signs Time Taken: 07:48 Temperature (F): 98.1 Height (in): 71 Pulse (bpm): 64 Weight (lbs): 210 Respiratory  Rate (breaths/min): 17 Body Mass Index (BMI): 29.3 Blood Pressure (mmHg): 112/64 Reference Range: 80 - 120 mg / dl Electronic Signature(s) Signed: 06/24/2020 5:40:01 PM By: Rhae Hammock RN Entered By: Rhae Hammock on 06/24/2020 07:49:14

## 2020-06-24 NOTE — Progress Notes (Signed)
[name], [name] ([id number]) Visit Report for 06/24/2020 HPI Details Patient Name: Date of Service: [name], [place] J. 06/24/2020 7:30 A M Medical Record Number: [id number] Patient Account Number: [id number] Date of Birth/Sex: Treating RN: 1960-12-27 (60 y.o. [name] Primary Care Provider: [name] Other Clinician: Referring Provider: Treating Provider/Extender: Talitha Givens HN Weeks in Treatment: 5 History of Present Illness HPI Description: ADMISSION 05/20/2020 This is a 60 year old man who is not a diabetic however he has a long history of chronic venous insufficiency with wounds on his lower extremities. He went to the wound care center in [place] for a long period of time with recurrent wounds in these areas treated with compression wraps. He comes in today with wounds on his bilateral lower legs that he says have been there about a year on the right and about 6 months on the left. He went to see Dr. Oneida Alar on 01/17/2020 who noted that he had no history of DVT but does have a history of venous insufficiency wounds. They noted that he was considering laser ablation in October 2020 but he canceled the procedure. He had an episode of bleeding from varicose veins in 2019. He had repeated venous reflux study on 10/6 which showed no evidence of a DVT or SVT Noted that he had venous reflux in the right greater saphenous vein in the thigh and the calf on the right. Also . noted to have venous reflux in the right femoral vein right popliteal vein and right small saphenous vein. At the time in October they wanted the wounds to heal a bit he also also had a right groin wound which is apparently closed. Not sure about the follow-up with vein and vascular. He comes in today with some light compression wraps and collagen on the wound placed by Dr. Lilia Pro who is a Education officer, environmental in [place]. He has 2 areas on the left leg 1 anteriorly and a large area posteriorly and a  smaller area on the right anterior lower leg. He has marked lower extremity edema which is nonpitting. Hemosiderin deposition on both lower legs extensively. There is dilated venules in his feet. Past medical history includes history of opioid abuse in remission, left nephrolithiasis, history of an ablation although I did not see this in Dr. Oneida Alar notes, congestive heart failure and anemia ABIs in our clinic were noncompressible bilaterally 05/27/2020; we admitted this patient to clinic last week. He has bilateral superficial venous insufficiency with bilateral wounds left greater than right. We put him in 4 layer compression with silver alginate. The he was previously cared for in the [place] wound care clinic when it was open. His wounds are better with compression this week and there is certainly less swelling. 2/21; bilateral superficial venous insufficiency wounds. A cluster on the left lateral. He has very well-defined areas on the right anterior right and right lateral. We are using silver alginate and 4-layer compression 2/28; bilateral superficial venous insufficiency wounds in the setting of significant stage III lymphedema. He has a cluster on the left which is smaller. His area on the right anterior and right lateral also look better. 3/14 superficial bilateral venous insufficiency wounds. Much better on both sides in fact the area on the right posterior is healed. He still has 2 small open areas on the right anterior and the left lateral Electronic Signature(s) Signed: 06/24/2020 1:09:53 PM By: Linton Ham MD Entered By: Linton Ham on 06/24/2020 08:33:29 -------------------------------------------------------------------------------- Physical Exam Details  Patient Name: Date of Service: [name], [place] J. 06/24/2020 7:30 A M Medical Record Number: [id number] Patient Account Number: [id number] Date of Birth/Sex: Treating RN: [birth year] (60 y.o. [name] Primary Care  Provider: [name] Other Clinician: Referring Provider: Treating Provider/Extender: Talitha Givens HN Weeks in Treatment: 5 Constitutional Sitting or standing Blood Pressure is within target range for patient.. Pulse regular and within target range for patient.Marland Kitchen Respirations regular, non-labored and within target range.. Temperature is normal and within the target range for the patient.Marland Kitchen Appears in no distress. Respiratory work of breathing is normal. Cardiovascular Pedal pulses are palpable. Notes Wound exam; the cluster of wounds on the left lateral lower leg is largely healed although her there is still 1 open area. On the right a small area on the right anterior. The area that was more lateral has closed over. We have good edema control in both areas no evidence of infection around the wounds Electronic Signature(s) Signed: 06/24/2020 1:09:53 PM By: Linton Ham MD Entered By: Linton Ham on 06/24/2020 08:34:44 -------------------------------------------------------------------------------- Physician Orders Details Patient Name: Date of Service: [name], [place] J. 06/24/2020 7:30 A M Medical Record Number: [id number] Patient Account Number: [id number] Date of Birth/Sex: Treating RN: 1960/12/25 (60 y.o. [name] Primary Care Provider: [name] Other Clinician: Referring Provider: Treating Provider/Extender: Talitha Givens HN Weeks in Treatment: 5 Verbal / Phone Orders: No Diagnosis Coding Follow-up Appointments Return Appointment in 1 week. Bathing/ Shower/ Hygiene May shower with protection but do not get wound dressing(s) wet. Edema Control - Lymphedema / SCD / Other Bilateral Lower Extremities Elevate legs to the level of the heart or above for 30 minutes daily and/or when sitting, a frequency of: - throughout the day Avoid standing for long periods of time. Exercise regularly Moisturize legs daily. Wound  Treatment Wound #1 - Lower Leg Wound Laterality: Right, Lateral Cleanser: Soap and Water 1 x Per Week Discharge Instructions: May shower and wash wound with dial antibacterial soap and water prior to dressing change. Peri-Wound Care: Triamcinolone 15 (g) 1 x Per Week Discharge Instructions: Use triamcinolone 15 (g) as directed Peri-Wound Care: Sween Lotion (Moisturizing lotion) 1 x Per Week Discharge Instructions: Apply moisturizing lotion as directed Prim Dressing: KerraCel Ag Gelling Fiber Dressing, 4x5 in (silver alginate) 1 x Per Week ary Discharge Instructions: Apply silver alginate to wound bed as instructed Secondary Dressing: ABD Pad, 8x10 1 x Per Week Discharge Instructions: Apply over primary dressing as directed. Secondary Dressing: Zetuvit Plus 4x8 in 1 x Per Week Discharge Instructions: Apply over primary dressing as directed. Compression Wrap: FourPress (4 layer compression wrap) 1 x Per Week Discharge Instructions: Apply four layer compression as directed. Compression Wrap: Unna boot below knee to help secure wrap in place 1 x Per Week Wound #3 - Lower Leg Wound Laterality: Left, Posterior Cleanser: Soap and Water 1 x Per Week Discharge Instructions: May shower and wash wound with dial antibacterial soap and water prior to dressing change. Peri-Wound Care: Triamcinolone 15 (g) 1 x Per Week Discharge Instructions: Use triamcinolone 15 (g) as directed Peri-Wound Care: Sween Lotion (Moisturizing lotion) 1 x Per Week Discharge Instructions: Apply moisturizing lotion as directed Prim Dressing: KerraCel Ag Gelling Fiber Dressing, 4x5 in (silver alginate) 1 x Per Week ary Discharge Instructions: Apply silver alginate to wound bed as instructed Secondary Dressing: ABD Pad, 8x10 1 x Per Week Discharge Instructions: Apply over primary dressing as directed. Secondary Dressing:  Zetuvit Plus 4x8 in 1 x Per Week Discharge Instructions: Apply over primary dressing as  directed. Compression Wrap: FourPress (4 layer compression wrap) 1 x Per Week Discharge Instructions: Apply four layer compression as directed. Compression Wrap: Unna boot below knee to help secure wrap in place 1 x Per Week Electronic Signature(s) Signed: 06/24/2020 8:34:50 AM By: Lorrin Jackson Signed: 06/24/2020 1:09:53 PM By: Linton Ham MD Previous Signature: 06/24/2020 7:49:31 AM Version By: Lorrin Jackson Entered By: Lorrin Jackson on 06/24/2020 08:34:49 -------------------------------------------------------------------------------- Problem List Details Patient Name: Date of Service: [name], [place]. 06/24/2020 7:30 A M Medical Record Number: [id number] Patient Account Number: [id number] Date of Birth/Sex: Treating RN: [birth year] (60 y.o. [name] Primary Care Provider: [name] Other Clinician: Referring Provider: Treating Provider/Extender: Talitha Givens HN Weeks in Treatment: 5 Active Problems ICD-10 Encounter Code Description Active Date MDM Diagnosis I87.333 Chronic venous hypertension (idiopathic) with ulcer and inflammation of 05/20/2020 No Yes bilateral lower extremity I89.0 Lymphedema, not elsewhere classified 05/20/2020 No Yes L97.811 Non-pressure chronic ulcer of other part of right lower leg limited to breakdown 05/20/2020 No Yes of skin L97.821 Non-pressure chronic ulcer of other part of left lower leg limited to breakdown 05/20/2020 No Yes of skin Inactive Problems Resolved Problems Electronic Signature(s) Signed: 06/24/2020 1:09:53 PM By: Linton Ham MD Entered By: Linton Ham on 06/24/2020 08:31:43 -------------------------------------------------------------------------------- Progress Note Details Patient Name: Date of Service: [name], [place] J. 06/24/2020 7:30 A M Medical Record Number: [id number] Patient Account Number: [id number] Date of Birth/Sex: Treating RN: 1960/12/17 (60 y.o. [name] Primary Care  Provider: [name] Other Clinician: Referring Provider: Treating Provider/Extender: Talitha Givens HN Weeks in Treatment: 5 Subjective History of Present Illness (HPI) ADMISSION 05/20/2020 This is a 60 year old man who is not a diabetic however he has a long history of chronic venous insufficiency with wounds on his lower extremities. He went to the wound care center in [place] for a long period of time with recurrent wounds in these areas treated with compression wraps. He comes in today with wounds on his bilateral lower legs that he says have been there about a year on the right and about 6 months on the left. He went to see Dr. Oneida Alar on 01/17/2020 who noted that he had no history of DVT but does have a history of venous insufficiency wounds. They noted that he was considering laser ablation in October 2020 but he canceled the procedure. He had an episode of bleeding from varicose veins in 2019. He had repeated venous reflux study on 10/6 which showed no evidence of a DVT or SVT Noted that he had venous reflux in the right greater saphenous vein in the thigh and the calf on the right. Also . noted to have venous reflux in the right femoral vein right popliteal vein and right small saphenous vein. At the time in October they wanted the wounds to heal a bit he also also had a right groin wound which is apparently closed. Not sure about the follow-up with vein and vascular. He comes in today with some light compression wraps and collagen on the wound placed by Dr. Lilia Pro who is a Education officer, environmental in [place]. He has 2 areas on the left leg 1 anteriorly and a large area posteriorly and a smaller area on the right anterior lower leg. He has marked lower extremity edema which is nonpitting. Hemosiderin deposition on both lower legs extensively. There  is dilated venules in his feet. Past medical history includes history of opioid abuse in remission, left nephrolithiasis,  history of an ablation although I did not see this in Dr. Oneida Alar notes, congestive heart failure and anemia ABIs in our clinic were noncompressible bilaterally 05/27/2020; we admitted this patient to clinic last week. He has bilateral superficial venous insufficiency with bilateral wounds left greater than right. We put him in 4 layer compression with silver alginate. The he was previously cared for in the [place] wound care clinic when it was open. His wounds are better with compression this week and there is certainly less swelling. 2/21; bilateral superficial venous insufficiency wounds. A cluster on the left lateral. He has very well-defined areas on the right anterior right and right lateral. We are using silver alginate and 4-layer compression 2/28; bilateral superficial venous insufficiency wounds in the setting of significant stage III lymphedema. He has a cluster on the left which is smaller. His area on the right anterior and right lateral also look better. 3/14 superficial bilateral venous insufficiency wounds. Much better on both sides in fact the area on the right posterior is healed. He still has 2 small open areas on the right anterior and the left lateral Objective Constitutional Sitting or standing Blood Pressure is within target range for patient.. Pulse regular and within target range for patient.Marland Kitchen Respirations regular, non-labored and within target range.. Temperature is normal and within the target range for the patient.Marland Kitchen Appears in no distress. Vitals Time Taken: 7:48 AM, Height: 71 in, Weight: 210 lbs, BMI: 29.3, Temperature: 98.1 F, Pulse: 64 bpm, Respiratory Rate: 17 breaths/min, Blood Pressure: 112/64 mmHg. Respiratory work of breathing is normal. Cardiovascular Pedal pulses are palpable. General Notes: Wound exam; the cluster of wounds on the left lateral lower leg is largely healed although her there is still 1 open area. On the right a small area on the right  anterior. The area that was more lateral has closed over. We have good edema control in both areas no evidence of infection around the wounds Integumentary (Hair, Skin) Wound #1 status is Open. Original cause of wound was Gradually Appeared. The date acquired was: 05/15/2019. The wound has been in treatment 5 weeks. The wound is located on the Right,Lateral Lower Leg. The wound measures 1.5cm length x 1.3cm width x 0.1cm depth; 1.532cm^2 area and 0.153cm^3 volume. There is no tunneling or undermining noted. There is a medium amount of serosanguineous drainage noted. The wound margin is distinct with the outline attached to the wound base. There is large (67-100%) red, pink granulation within the wound bed. There is a small (1-33%) amount of necrotic tissue within the wound bed including Adherent Slough. Wound #3 status is Open. Original cause of wound was Gradually Appeared. The date acquired was: 05/15/2019. The wound has been in treatment 5 weeks. The wound is located on the Left,Posterior Lower Leg. The wound measures 1cm length x 0.9cm width x 0.2cm depth; 0.707cm^2 area and 0.141cm^3 volume. There is no tunneling or undermining noted. There is a medium amount of serosanguineous drainage noted. The wound margin is distinct with the outline attached to the wound base. There is large (67-100%) red, pink granulation within the wound bed. There is a small (1-33%) amount of necrotic tissue within the wound bed including Adherent Slough. Wound #4 status is Healed - Epithelialized. Original cause of wound was Gradually Appeared. The date acquired was: 05/30/2020. The wound has been in treatment 3 weeks. The wound is located on  the Right,Posterior Lower Leg. The wound measures 0cm length x 0cm width x 0cm depth; 0cm^2 area and 0cm^3 volume. 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 There was a Four Layer Compression Therapy Procedure by Levan Hurst, RN. Post procedure Diagnosis Wound #1: Same as Pre-Procedure Wound #3 Pre-procedure diagnosis of Wound #3 is a Venous Leg Ulcer located on the Left,Posterior Lower Leg . There was a Four Layer Compression Therapy Procedure by Levan Hurst, RN. Post procedure Diagnosis Wound #3: Same as Pre-Procedure Plan Follow-up Appointments: Return Appointment in 1 week. Bathing/ Shower/ Hygiene: May shower with protection but do not get wound dressing(s) wet. Edema Control - Lymphedema / SCD / Other: Elevate legs to the level of the heart or above for 30 minutes daily and/or when sitting, a frequency of: - throughout the day Avoid standing for long periods of time. Exercise regularly Moisturize legs daily. WOUND #1: - Lower Leg Wound Laterality: Right, Lateral Cleanser: Soap and Water 1 x Per Week/ Discharge Instructions: May shower and wash wound with dial antibacterial soap and water prior to dressing change. Peri-Wound Care: Triamcinolone 15 (g) 1 x Per Week/ Discharge Instructions: Use triamcinolone 15 (g) as directed Peri-Wound Care: Sween Lotion (Moisturizing lotion) 1 x Per Week/ Discharge Instructions: Apply moisturizing lotion as directed Prim Dressing: KerraCel Ag Gelling Fiber Dressing, 4x5 in (silver alginate) 1 x Per Week/ ary Discharge Instructions: Apply silver alginate to wound bed as instructed Secondary Dressing: ABD Pad, 8x10 1 x Per Week/ Discharge Instructions: Apply over primary dressing as directed. Secondary Dressing: Zetuvit Plus 4x8 in 1 x Per Week/ Discharge Instructions: Apply over primary  dressing as directed. Com pression Wrap: FourPress (4 layer compression wrap) 1 x Per Week/ Discharge Instructions: Apply four layer compression as directed. Com pression Wrap: Unna boot below knee to help secure wrap in place 1 x Per Week/ WOUND #3: - Lower Leg Wound Laterality: Left, Posterior Cleanser: Soap and Water 1 x Per Week/ Discharge Instructions: May shower and wash wound with dial antibacterial soap and water prior to dressing change. Peri-Wound Care: Triamcinolone 15 (g) 1 x Per Week/ Discharge Instructions: Use triamcinolone 15 (g) as directed Peri-Wound Care: Sween Lotion (Moisturizing lotion) 1 x Per Week/ Discharge Instructions: Apply moisturizing lotion as directed Prim Dressing: KerraCel Ag Gelling Fiber Dressing, 4x5 in (silver alginate) 1 x Per Week/ ary Discharge Instructions: Apply silver alginate to wound bed as instructed Secondary Dressing: ABD Pad, 8x10 1 x Per Week/ Discharge Instructions: Apply over primary dressing as directed. Secondary Dressing: Zetuvit Plus 4x8 in 1 x Per Week/ Discharge Instructions: Apply over primary dressing as directed. Com pression Wrap: FourPress (4 layer compression wrap) 1 x Per Week/ Discharge Instructions: Apply four layer compression as directed. Com pression Wrap: Unna boot below knee to help secure wrap in place 1 x Per Week/ 1. At this point I do not see any reason to change the dressings we are using silver alginate with 4-layer compression 2. We are going to go ahead and over order external compression pumps he is not healed after a month of standard compression. More importantly than this it is  very clear that he is going to need lifelong edema control 3. He is also asking today about ablation by Dr. Oneida Alar I will need to look over this but I have encouraged him to go ahead with this. What ever we can do to relieve venous hypertension in these areas is likely to be beneficial with really minimal risk of the  procedure Electronic Signature(s) Signed: 06/24/2020 1:09:53 PM By: Linton Ham MD Entered By: Linton Ham on 06/24/2020 08:36:03 -------------------------------------------------------------------------------- SuperBill Details Patient Name: Date of Service: [name], [place] J. 06/24/2020 Medical Record Number: [id number] Patient Account Number: [id number] Date of Birth/Sex: Treating RN: May 20, 1960 (60 y.o. [name] Primary Care Provider: [name] Other Clinician: Referring Provider: Treating Provider/Extender: Talitha Givens HN Weeks in Treatment: 5 Diagnosis Coding ICD-10 Codes Code Description [phone] Chronic venous hypertension (idiopathic) with ulcer and inflammation of bilateral lower extremity I89.0 Lymphedema, not elsewhere classified L97.811 Non-pressure chronic ulcer of other part of right lower leg limited to breakdown of skin L97.821 Non-pressure chronic ulcer of other part of left lower leg limited to breakdown of skin Facility Procedures CPT4: Code [id number] Description: 81 BILATERAL: Application of multi-layer venous compression system; leg (below knee), including ankle and t. ICD-10 Diagnosis Description L97.811 Non-pressure chronic ulcer of other part of right lower leg limited to breakdown of skin  L97.821 Non-pressure chronic ulcer of other part of left lower leg limited to breakdown of skin I89.0 Lymphedema, not elsewhere classified Modifier: Quantity: 1 Physician Procedures : CPT4 Code Description Modifier [id number] - WC PHYS LEVEL 3 - EST PT ICD-10 Diagnosis Description I87.333 Chronic venous hypertension (idiopathic) with ulcer and inflammation of bilateral lower extremity I89.0 Lymphedema, not elsewhere classified  L97.811 Non-pressure chronic ulcer of other part of right lower leg limited to breakdown of skin L97.821 Non-pressure chronic ulcer of other part of left lower leg limited to breakdown of  skin Quantity: 1 Electronic Signature(s) Signed: 06/24/2020 1:09:53 PM By: Linton Ham MD Signed: 06/24/2020 6:16:37 PM By: Levan Hurst RN, BSN Entered By: Levan Hurst on 06/24/2020 53:74:82

## 2020-07-01 ENCOUNTER — Other Ambulatory Visit: Payer: Self-pay

## 2020-07-01 ENCOUNTER — Encounter (HOSPITAL_BASED_OUTPATIENT_CLINIC_OR_DEPARTMENT_OTHER): Payer: PPO | Admitting: Internal Medicine

## 2020-07-01 DIAGNOSIS — I87333 Chronic venous hypertension (idiopathic) with ulcer and inflammation of bilateral lower extremity: Secondary | ICD-10-CM | POA: Diagnosis not present

## 2020-07-01 DIAGNOSIS — F1111 Opioid abuse, in remission: Secondary | ICD-10-CM | POA: Diagnosis not present

## 2020-07-01 DIAGNOSIS — L97222 Non-pressure chronic ulcer of left calf with fat layer exposed: Secondary | ICD-10-CM | POA: Diagnosis not present

## 2020-07-01 DIAGNOSIS — L97811 Non-pressure chronic ulcer of other part of right lower leg limited to breakdown of skin: Secondary | ICD-10-CM | POA: Diagnosis not present

## 2020-07-01 NOTE — Progress Notes (Signed)
NEEDHAM, BIGGINS ([id number]) Visit Report for 07/01/2020 HPI Details Patient Name: Date of Service: [name], Marvin UL J. 07/01/2020 8:00 A M Medical Record Number: [id number] Patient Account Number: [id number] Date of Birth/Sex: Treating RN: 1961-03-24 (60 y.o. [name] Primary Care Provider: [name] Other Clinician: Referring Provider: Treating Provider/Extender: Talitha Givens HN Weeks in Treatment: 6 History of Present Illness HPI Description: ADMISSION 05/20/2020 This is a 60 year old man who is not a diabetic however he has a long history of chronic venous insufficiency with wounds on his lower extremities. He went to the wound care center in [place] for a long period of time with recurrent wounds in these areas treated with compression wraps. He comes in today with wounds on his bilateral lower legs that he says have been there about a year on the right and about 6 months on the left. He went to see Dr. Oneida Alar on 01/17/2020 who noted that he had no history of DVT but does have a history of venous insufficiency wounds. They noted that he was considering laser ablation in October 2020 but he canceled the procedure. He had an episode of bleeding from varicose veins in 2019. He had repeated venous reflux study on 10/6 which showed no evidence of a DVT or SVT Noted that he had venous reflux in the right greater saphenous vein in the thigh and the calf on the right. Also . noted to have venous reflux in the right femoral vein right popliteal vein and right small saphenous vein. At the time in October they wanted the wounds to heal a bit he also also had a right groin wound which is apparently closed. Not sure about the follow-up with vein and vascular. He comes in today with some light compression wraps and collagen on the wound placed by Dr. Lilia Pro who is a Education officer, environmental in [place]. He has 2 areas on the left leg 1 anteriorly and a large area posteriorly and a  smaller area on the right anterior lower leg. He has marked lower extremity edema which is nonpitting. Hemosiderin deposition on both lower legs extensively. There is dilated venules in his feet. Past medical history includes history of opioid abuse in remission, left nephrolithiasis, history of an ablation although I did not see this in Dr. Oneida Alar notes, congestive heart failure and anemia ABIs in our clinic were noncompressible bilaterally 05/27/2020; we admitted this patient to clinic last week. He has bilateral superficial venous insufficiency with bilateral wounds left greater than right. We put him in 4 layer compression with silver alginate. The he was previously cared for in the [facility] wound care clinic when it was open. His wounds are better with compression this week and there is certainly less swelling. 2/21; bilateral superficial venous insufficiency wounds. A cluster on the left lateral. He has very well-defined areas on the right anterior right and right lateral. We are using silver alginate and 4-layer compression 2/28; bilateral superficial venous insufficiency wounds in the setting of significant stage III lymphedema. He has a cluster on the left which is smaller. His area on the right anterior and right lateral also look better. 3/14 superficial bilateral venous insufficiency wounds. Much better on both sides in fact the area on the right posterior is healed. He still has 2 small open areas on the right anterior and the left lateral 3/21; superficial bilateral venous insufficiency wounds. Everything is closed on the right side. Small area still remains on the left. We  will transition him today to his right extremiteaze stocking. Still dressing the remaining wound on the left with compression Electronic Signature(s) Signed: 07/01/2020 5:03:25 PM By: Linton Ham MD Entered By: Linton Ham on 07/01/2020  08:46:37 -------------------------------------------------------------------------------- Physical Exam Details Patient Name: Date of Service: Marvin Atlas, Marvin UL J. 07/01/2020 8:00 A M Medical Record Number: [id number] Patient Account Number: [id number] Date of Birth/Sex: Treating RN: 1961-02-06 (60 y.o. [name] Primary Care Provider: [name] Other Clinician: Referring Provider: Treating Provider/Extender: Talitha Givens HN Weeks in Treatment: 6 Constitutional Sitting or standing Blood Pressure is within target range for patient.. Pulse regular and within target range for patient.Marland Kitchen Respirations regular, non-labored and within target range.. Temperature is normal and within the target range for the patient.Marland Kitchen Appears in no distress. Notes Wound exam; there is only one area left on the left lateral lower leg this is small. Everything is closed on the right. No debridement is required. His edema control is good. Severe bilateral hemosiderin deposition and very severe venous hypertension is evident. No evidence of infection Electronic Signature(s) Signed: 07/01/2020 5:03:25 PM By: Linton Ham MD Entered By: Linton Ham on 07/01/2020 08:47:39 -------------------------------------------------------------------------------- Physician Orders Details Patient Name: Date of Service: Marvin Atlas, Marvin UL J. 07/01/2020 8:00 A M Medical Record Number: [id number] Patient Account Number: [id number] Date of Birth/Sex: Treating RN: 1960/09/14 (60 y.o. [name] Primary Care Provider: [name] Other Clinician: Referring Provider: Treating Provider/Extender: Talitha Givens HN Weeks in Treatment: 6 Verbal / Phone Orders: No Diagnosis Coding ICD-10 Coding Code Description [id number] Chronic venous hypertension (idiopathic) with ulcer and inflammation of bilateral lower extremity I89.0 Lymphedema, not elsewhere classified L97.811 Non-pressure  chronic ulcer of other part of right lower leg limited to breakdown of skin L97.821 Non-pressure chronic ulcer of other part of left lower leg limited to breakdown of skin Follow-up Appointments Return Appointment in 1 week. Bathing/ Shower/ Hygiene May shower with protection but do not get wound dressing(s) wet. Edema Control - Lymphedema / SCD / Other Bilateral Lower Extremities Elevate legs to the level of the heart or above for 30 minutes daily and/or when sitting, a frequency of: - throughout the day Avoid standing for long periods of time. Exercise regularly Moisturize legs daily. Compression stocking or Garment 30-40 mm/Hg pressure to: - Extremit-ease to right leg daily. Apply first thing in the morning, remove at night before bed. Wound Treatment Wound #3 - Lower Leg Wound Laterality: Left, Posterior Cleanser: Soap and Water 1 x Per Week Discharge Instructions: May shower and wash wound with dial antibacterial soap and water prior to dressing change. Peri-Wound Care: Triamcinolone 15 (g) 1 x Per Week Discharge Instructions: Use triamcinolone 15 (g) as directed Peri-Wound Care: Sween Lotion (Moisturizing lotion) 1 x Per Week Discharge Instructions: Apply moisturizing lotion as directed Prim Dressing: KerraCel Ag Gelling Fiber Dressing, 4x5 in (silver alginate) 1 x Per Week ary Discharge Instructions: Apply silver alginate to wound bed as instructed Secondary Dressing: Woven Gauze Sponge, Non-Sterile 4x4 in 1 x Per Week Discharge Instructions: Apply over primary dressing as directed. Secondary Dressing: ABD Pad, 8x10 1 x Per Week Discharge Instructions: Apply over primary dressing as directed. Compression Wrap: FourPress (4 layer compression wrap) 1 x Per Week Discharge Instructions: Apply four layer compression as directed. Compression Wrap: Unna boot under wrap to help secure in place 1 x Per Week Electronic Signature(s) Signed: 07/01/2020 5:03:25 PM By: Linton Ham  MD Signed: 07/01/2020 5:12:11  PM By: Levan Hurst RN, BSN Entered By: Levan Hurst on 07/01/2020 08:35:31 -------------------------------------------------------------------------------- Problem List Details Patient Name: Date of Service: Marvin Atlas, Marvin UL J. 07/01/2020 8:00 A M Medical Record Number: [id number] Patient Account Number: [id number] Date of Birth/Sex: Treating RN: 06-Mar-1961 (60 y.o. [name] Primary Care Provider: [name] Other Clinician: Referring Provider: Treating Provider/Extender: Talitha Givens HN Weeks in Treatment: 6 Active Problems ICD-10 Encounter Code Description Active Date MDM Diagnosis I87.333 Chronic venous hypertension (idiopathic) with ulcer and inflammation of 05/20/2020 No Yes bilateral lower extremity I89.0 Lymphedema, not elsewhere classified 05/20/2020 No Yes L97.811 Non-pressure chronic ulcer of other part of right lower leg limited to breakdown 05/20/2020 No Yes of skin L97.821 Non-pressure chronic ulcer of other part of left lower leg limited to breakdown 05/20/2020 No Yes of skin Inactive Problems Resolved Problems Electronic Signature(s) Signed: 07/01/2020 5:03:25 PM By: Linton Ham MD Entered By: Linton Ham on 07/01/2020 08:42:11 -------------------------------------------------------------------------------- Progress Note Details Patient Name: Date of Service: Marvin Atlas, Marvin UL J. 07/01/2020 8:00 A M Medical Record Number: [id number] Patient Account Number: [id number] Date of Birth/Sex: Treating RN: 04-13-1961 (60 y.o. [name] Primary Care Provider: [name] Other Clinician: Referring Provider: Treating Provider/Extender: Talitha Givens HN Weeks in Treatment: 6 Subjective History of Present Illness (HPI) ADMISSION 05/20/2020 This is a 60 year old man who is not a diabetic however he has a long history of chronic venous insufficiency with wounds on his lower  extremities. He went to the wound care center in [place] for a long period of time with recurrent wounds in these areas treated with compression wraps. He comes in today with wounds on his bilateral lower legs that he says have been there about a year on the right and about 6 months on the left. He went to see Dr. Oneida Alar on 01/17/2020 who noted that he had no history of DVT but does have a history of venous insufficiency wounds. They noted that he was considering laser ablation in October 2020 but he canceled the procedure. He had an episode of bleeding from varicose veins in 2019. He had repeated venous reflux study on 10/6 which showed no evidence of a DVT or SVT Noted that he had venous reflux in the right greater saphenous vein in the thigh and the calf on the right. Also . noted to have venous reflux in the right femoral vein right popliteal vein and right small saphenous vein. At the time in October they wanted the wounds to heal a bit he also also had a right groin wound which is apparently closed. Not sure about the follow-up with vein and vascular. He comes in today with some light compression wraps and collagen on the wound placed by Dr. Lilia Pro who is a Education officer, environmental in [place]. He has 2 areas on the left leg 1 anteriorly and a large area posteriorly and a smaller area on the right anterior lower leg. He has marked lower extremity edema which is nonpitting. Hemosiderin deposition on both lower legs extensively. There is dilated venules in his feet. Past medical history includes history of opioid abuse in remission, left nephrolithiasis, history of an ablation although I did not see this in Dr. Oneida Alar notes, congestive heart failure and anemia ABIs in our clinic were noncompressible bilaterally 05/27/2020; we admitted this patient to clinic last week. He has bilateral superficial venous insufficiency with bilateral wounds left greater than right. We put him in 4 layer  compression with  silver alginate. The he was previously cared for in the [facility] wound care clinic when it was open. His wounds are better with compression this week and there is certainly less swelling. 2/21; bilateral superficial venous insufficiency wounds. A cluster on the left lateral. He has very well-defined areas on the right anterior right and right lateral. We are using silver alginate and 4-layer compression 2/28; bilateral superficial venous insufficiency wounds in the setting of significant stage III lymphedema. He has a cluster on the left which is smaller. His area on the right anterior and right lateral also look better. 3/14 superficial bilateral venous insufficiency wounds. Much better on both sides in fact the area on the right posterior is healed. He still has 2 small open areas on the right anterior and the left lateral 3/21; superficial bilateral venous insufficiency wounds. Everything is closed on the right side. Small area still remains on the left. We will transition him today to his right extremiteaze stocking. Still dressing the remaining wound on the left with compression Objective Constitutional Sitting or standing Blood Pressure is within target range for patient.. Pulse regular and within target range for patient.Marland Kitchen Respirations regular, non-labored and within target range.. Temperature is normal and within the target range for the patient.Marland Kitchen Appears in no distress. Vitals Time Taken: 8:01 AM, Height: 71 in, Weight: 210 lbs, BMI: 29.3, Temperature: 97.6 F, Pulse: 67 bpm, Respiratory Rate: 18 breaths/min, Blood Pressure: 104/62 mmHg. General Notes: Wound exam; there is only one area left on the left lateral lower leg this is small. Everything is closed on the right. No debridement is required. His edema control is good. Severe bilateral hemosiderin deposition and very severe venous hypertension is evident. No evidence of infection Integumentary (Hair, Skin) Wound #1 status is Healed -  Epithelialized. Original cause of wound was Gradually Appeared. The date acquired was: 05/15/2019. The wound has been in treatment 6 weeks. The wound is located on the Right,Lateral Lower Leg. The wound measures 0cm length x 0cm width x 0cm depth; 0cm^2 area and 0cm^3 volume. There is no tunneling or undermining noted. There is a none present amount of drainage noted. There is no granulation within the wound bed. There is no necrotic tissue within the wound bed. Wound #3 status is Open. Original cause of wound was Gradually Appeared. The date acquired was: 05/15/2019. The wound has been in treatment 6 weeks. The wound is located on the Left,Posterior Lower Leg. The wound measures 0.9cm length x 1cm width x 0.1cm depth; 0.707cm^2 area and 0.071cm^3 volume. There is Fat Layer (Subcutaneous Tissue) exposed. There is no tunneling or undermining noted. There is a small amount of serosanguineous drainage noted. The wound margin is flat and intact. There is large (67-100%) red granulation within the wound bed. There is no necrotic tissue within the wound bed. Assessment Active Problems ICD-10 Chronic venous hypertension (idiopathic) with ulcer and inflammation of bilateral lower extremity Lymphedema, not elsewhere classified Non-pressure chronic ulcer of other part of right lower leg limited to breakdown of skin Non-pressure chronic ulcer of other part of left lower leg limited to breakdown of skin Procedures Wound #3 Pre-procedure diagnosis of Wound #3 is a Venous Leg Ulcer located on the Left,Posterior Lower Leg . There was a Four Layer Compression Therapy Procedure by Levan Hurst, RN. Post procedure Diagnosis Wound #3: Same as Pre-Procedure Plan Follow-up Appointments: Return Appointment in 1 week. Bathing/ Shower/ Hygiene: May shower with protection but do not get wound dressing(s) wet. Edema  Control - Lymphedema / SCD / Other: Elevate legs to the level of the heart or above for 30 minutes  daily and/or when sitting, a frequency of: - throughout the day Avoid standing for long periods of time. Exercise regularly Moisturize legs daily. Compression stocking or Garment 30-40 mm/Hg pressure to: - Extremit-ease to right leg daily. Apply first thing in the morning, remove at night before bed. WOUND #3: - Lower Leg Wound Laterality: Left, Posterior Cleanser: Soap and Water 1 x Per Week/ Discharge Instructions: May shower and wash wound with dial antibacterial soap and water prior to dressing change. Peri-Wound Care: Triamcinolone 15 (g) 1 x Per Week/ Discharge Instructions: Use triamcinolone 15 (g) as directed Peri-Wound Care: Sween Lotion (Moisturizing lotion) 1 x Per Week/ Discharge Instructions: Apply moisturizing lotion as directed Prim Dressing: KerraCel Ag Gelling Fiber Dressing, 4x5 in (silver alginate) 1 x Per Week/ ary Discharge Instructions: Apply silver alginate to wound bed as instructed Secondary Dressing: Woven Gauze Sponge, Non-Sterile 4x4 in 1 x Per Week/ Discharge Instructions: Apply over primary dressing as directed. Secondary Dressing: ABD Pad, 8x10 1 x Per Week/ Discharge Instructions: Apply over primary dressing as directed. Com pression Wrap: FourPress (4 layer compression wrap) 1 x Per Week/ Discharge Instructions: Apply four layer compression as directed. Com pression Wrap: Unna boot under wrap to help secure in place 1 x Per Week/ 1. The right leg is ready for his extremity stocking. Cautioned to moisten the leg with skin moisturizer keep his leg elevated. Encouraged mobility. 2. The area on the left is small clean no debridement is required. Hopefully healed by next week or the week after. Using silver alginate under an Haematologist with an ABD pad Electronic Signature(s) Signed: 07/01/2020 5:03:25 PM By: Linton Ham MD Entered By: Linton Ham on 07/01/2020  08:49:08 -------------------------------------------------------------------------------- SuperBill Details Patient Name: Date of Service: Marvin Atlas, Marvin UL J. 07/01/2020 Medical Record Number: [id number] Patient Account Number: [id number] Date of Birth/Sex: Treating RN: 1961-01-29 (60 y.o. [name] Primary Care Provider: [name] Other Clinician: Referring Provider: Treating Provider/Extender: Talitha Givens HN Weeks in Treatment: 6 Diagnosis Coding ICD-10 Codes Code Description [phone] Chronic venous hypertension (idiopathic) with ulcer and inflammation of bilateral lower extremity I89.0 Lymphedema, not elsewhere classified L97.811 Non-pressure chronic ulcer of other part of right lower leg limited to breakdown of skin L97.821 Non-pressure chronic ulcer of other part of left lower leg limited to breakdown of skin Facility Procedures Physician Procedures : CPT4 Code Description Modifier [id number] - WC PHYS LEVEL 3 - EST PT ICD-10 Diagnosis Description L97.821 Non-pressure chronic ulcer of other part of left lower leg limited to breakdown of skin I87.333 Chronic venous hypertension (idiopathic) with  ulcer and inflammation of bilateral lower extremity I89.0 Lymphedema, not elsewhere classified Quantity: 1 Electronic Signature(s) Signed: 07/01/2020 5:03:25 PM By: Linton Ham MD Entered By: Linton Ham on 07/01/2020 08:49:29

## 2020-07-02 DIAGNOSIS — M810 Age-related osteoporosis without current pathological fracture: Secondary | ICD-10-CM | POA: Diagnosis not present

## 2020-07-02 DIAGNOSIS — R202 Paresthesia of skin: Secondary | ICD-10-CM | POA: Diagnosis not present

## 2020-07-02 DIAGNOSIS — Z6832 Body mass index (BMI) 32.0-32.9, adult: Secondary | ICD-10-CM | POA: Diagnosis not present

## 2020-07-02 NOTE — Progress Notes (Signed)
PAXSON, HARROWER ([id number]) Visit Report for 07/01/2020 Arrival Information Details Patient Name: Date of Service: [name], [place] J. 07/01/2020 8:00 A M Medical Record Number: [id number] Patient Account Number: [id number] Date of Birth/Sex: Treating RN: [birth year] (60 y.o. [name] Primary Care Provider: [name] Other Clinician: Referring Provider: Treating Provider/Extender: Talitha Givens HN Weeks in Treatment: 6 Visit Information History Since Last Visit Added or deleted any medications: No Patient Arrived: Ambulatory Any new allergies or adverse reactions: No Arrival Time: 08:01 Had a fall or experienced change in No Accompanied By: self activities of daily living that may affect Transfer Assistance: None risk of falls: Patient Identification Verified: Yes Signs or symptoms of abuse/neglect since last visito No Secondary Verification Process Completed: Yes Hospitalized since last visit: No Patient Requires Transmission-Based Precautions: No Implantable device outside of the clinic excluding No Patient Has Alerts: Yes cellular tissue based products placed in the center Patient Alerts: ABI's R: N/C since last visit: Has Dressing in Place as Prescribed: Yes Pain Present Now: No Electronic Signature(s) Signed: 07/01/2020 4:46:01 PM By: Sandre Kitty Entered By: Sandre Kitty on 07/01/2020 08:01:22 -------------------------------------------------------------------------------- Compression Therapy Details Patient Name: Date of Service: Marvin Atlas, [place] J. 07/01/2020 8:00 A M Medical Record Number: [id number] Patient Account Number: [id number] Date of Birth/Sex: Treating RN: [birth year] (60 y.o. [name] Primary Care Provider: [name] Other Clinician: Referring Provider: Treating Provider/Extender: Talitha Givens HN Weeks in Treatment: 6 Compression Therapy Performed for Wound Assessment: Wound #3  Left,Posterior Lower Leg Performed By: Clinician Levan Hurst, RN Compression Type: Four Layer Post Procedure Diagnosis Same as Pre-procedure Electronic Signature(s) Signed: 07/01/2020 5:12:11 PM By: Levan Hurst RN, BSN Entered By: Levan Hurst on 07/01/2020 08:33:49 -------------------------------------------------------------------------------- Encounter Discharge Information Details Patient Name: Date of Service: Marvin Atlas, [place] J. 07/01/2020 8:00 A M Medical Record Number: [id number] Patient Account Number: [id number] Date of Birth/Sex: Treating RN: [birth year] (60 y.o. Marcheta Grammes Primary Care Provider: [name] Other Clinician: Referring Provider: Treating Provider/Extender: Talitha Givens HN Weeks in Treatment: 6 Encounter Discharge Information Items Discharge Condition: Stable Ambulatory Status: Ambulatory Discharge Destination: Home Transportation: Private Auto Schedule Follow-up Appointment: Yes Clinical Summary of Care: Provided on 07/01/2020 Form Type Recipient Paper Patient Patient Electronic Signature(s) Signed: 07/01/2020 8:58:15 AM By: Lorrin Jackson Entered By: Lorrin Jackson on 07/01/2020 08:58:15 -------------------------------------------------------------------------------- Lower Extremity Assessment Details Patient Name: Date of Service: Marvin Atlas, Marvin [place] J. 07/01/2020 8:00 A M Medical Record Number: [id number] Patient Account Number: [id number] Date of Birth/Sex: Treating RN: 10-11-1960 (60 y.o. [name] Primary Care Provider: [name] Other Clinician: Referring Provider: Treating Provider/Extender: Talitha Givens HN Weeks in Treatment: 6 Edema Assessment Assessed: [Left: No] [Right: No] Edema: [Left: Yes] [Right: Yes] Calf Left: Right: Point of Measurement: From Medial Instep 33.5 cm 34.4 cm Ankle Left: Right: Point of Measurement: From Medial Instep 23.5 cm 23.5 cm Vascular  Assessment Pulses: Dorsalis Pedis Palpable: [Left:Yes] [Right:Yes] Electronic Signature(s) Signed: 07/01/2020 4:14:31 PM By: Baruch Gouty RN, BSN Entered By: Baruch Gouty on 07/01/2020 08:14:32 -------------------------------------------------------------------------------- Multi Wound Chart Details Patient Name: Date of Service: Marvin Atlas, Marvin [place] J. 07/01/2020 8:00 A M Medical Record Number: [id number] Patient Account Number: [id number] Date of Birth/Sex: Treating RN: [birth year] (60 y.o. [name] Primary Care Provider: [name] Other Clinician: Referring Provider: Treating Provider/Extender: Talitha Givens HN Weeks in  Treatment: 6 Vital Signs Height(in): 71 Pulse(bpm): 67 Weight(lbs): 210 Blood Pressure(mmHg): 104/62 Body Mass Index(BMI): 29 Temperature(F): 97.6 Respiratory Rate(breaths/min): 18 Photos: [1:No Photos Right, Lateral Lower Leg] [3:No Photos Left, Posterior Lower Leg] [N/A:N/A N/A] Wound Location: [1:Gradually Appeared] [3:Gradually Appeared] [N/A:N/A] Wounding Event: [1:Venous Leg Ulcer] [3:Venous Leg Ulcer] [N/A:N/A] Primary Etiology: [1:Anemia, Sleep Apnea, Congestive] [3:Anemia, Sleep Apnea, Congestive] [N/A:N/A] Comorbid History: [1:Heart Failure, Coronary Artery Disease, Osteoarthritis 05/15/2019] [3:Heart Failure, Coronary Artery Disease, Osteoarthritis 05/15/2019] [N/A:N/A] Date Acquired: [1:6] [3:6] [N/A:N/A] Weeks of Treatment: [1:Healed - Epithelialized] [3:Open] [N/A:N/A] Wound Status: [1:0x0x0] [3:0.9x1x0.1] [N/A:N/A] Measurements L x W x D (cm) [1:0] [0:2.409] [N/A:N/A] A (cm) : rea [1:0] [3:0.071] [N/A:N/A] Volume (cm) : [1:100.00%] [3:98.70%] [N/A:N/A] % Reduction in Area: [1:100.00%] [3:98.70%] [N/A:N/A] % Reduction in Volume: [1:Full Thickness Without Exposed] [3:Full Thickness Without Exposed] [N/A:N/A] Classification: [1:Support Structures None Present] [3:Support Structures Small] [N/A:N/A] Exudate Amount:  [1:N/A] [3:Serosanguineous] [N/A:N/A] Exudate Type: [1:N/A] [3:red, brown] [N/A:N/A] Exudate Color: [1:N/A] [3:Flat and Intact] [N/A:N/A] Wound Margin: [1:None Present (0%)] [3:Large (67-100%)] [N/A:N/A] Granulation Amount: [1:N/A] [3:Red] [N/A:N/A] Granulation Quality: [1:None Present (0%)] [3:None Present (0%)] [N/A:N/A] Necrotic Amount: [1:Fascia: No] [3:Fat Layer (Subcutaneous Tissue): Yes N/A] Exposed Structures: [1:Fat Layer (Subcutaneous Tissue): No Tendon: No Muscle: No Joint: No Bone: No Large (67-100%)] [3:Fascia: No Tendon: No Muscle: No Joint: No Bone: No Small (1-33%)] [N/A:N/A] Epithelialization: [1:N/A] [3:Compression Therapy] [N/A:N/A] Treatment Notes Electronic Signature(s) Signed: 07/01/2020 5:03:25 PM By: Linton Ham MD Signed: 07/01/2020 5:12:11 PM By: Levan Hurst RN, BSN Entered By: Linton Ham on 07/01/2020 08:45:51 -------------------------------------------------------------------------------- Multi-Disciplinary Care Plan Details Patient Name: Date of Service: Marvin Atlas, [place] J. 07/01/2020 8:00 A M Medical Record Number: [id number] Patient Account Number: [id number] Date of Birth/Sex: Treating RN: 04/06/1961 (60 y.o. [name] Primary Care Provider: [name] Other Clinician: Referring Provider: Treating Provider/Extender: Talitha Givens HN Weeks in Treatment: 6 Multidisciplinary Care Plan reviewed with physician Active Inactive Venous Leg Ulcer Nursing Diagnoses: Actual venous Insuffiency (use after diagnosis is confirmed) Knowledge deficit related to disease process and management Goals: Patient will maintain optimal edema control Date Initiated: 05/20/2020 Target Resolution Date: 08/05/2020 Goal Status: Active Patient/caregiver will verbalize understanding of disease process and disease management Date Initiated: 05/20/2020 Target Resolution Date: 08/05/2020 Goal Status: Active Interventions: Assess peripheral  edema status every visit. Compression as ordered Provide education on venous insufficiency Notes: 06/24/20: Edema control and wound management ongoing. Wound/Skin Impairment Nursing Diagnoses: Impaired tissue integrity Knowledge deficit related to smoking impact on wound healing Knowledge deficit related to ulceration/compromised skin integrity Goals: Patient will demonstrate a reduced rate of smoking or cessation of smoking Date Initiated: 05/20/2020 Target Resolution Date: 08/05/2020 Goal Status: Active Patient/caregiver will verbalize understanding of skin care regimen Date Initiated: 05/20/2020 Target Resolution Date: 08/05/2020 Goal Status: Active Ulcer/skin breakdown will have a volume reduction of 30% by week 4 Date Initiated: 05/20/2020 Date Inactivated: 06/24/2020 Target Resolution Date: 06/21/2020 Goal Status: Met Ulcer/skin breakdown will have a volume reduction of 50% by week 8 Date Initiated: 06/24/2020 Target Resolution Date: 07/22/2020 Goal Status: Active Interventions: Assess patient/caregiver ability to obtain necessary supplies Assess patient/caregiver ability to perform ulcer/skin care regimen upon admission and as needed Assess ulceration(s) every visit Provide education on smoking Provide education on ulcer and skin care Notes: Electronic Signature(s) Signed: 07/01/2020 5:12:11 PM By: Levan Hurst RN, BSN Entered By: Levan Hurst on 07/01/2020 08:02:54 -------------------------------------------------------------------------------- Pain Assessment Details Patient Name: Date of Service: Marvin Atlas, [place] J. 07/01/2020 8:00 A M  Medical Record Number: [id number] Patient Account Number: [id number] Date of Birth/Sex: Treating RN: [birth year] (60 y.o. [name] Primary Care Provider: [name] Other Clinician: Referring Provider: Treating Provider/Extender: Talitha Givens HN Weeks in Treatment: 6 Active Problems Location of Pain  Severity and Description of Pain Patient Has Paino No Site Locations Pain Management and Medication Current Pain Management: Electronic Signature(s) Signed: 07/01/2020 4:46:01 PM By: Sandre Kitty Signed: 07/01/2020 5:12:11 PM By: Levan Hurst RN, BSN Entered By: Sandre Kitty on 07/01/2020 08:01:29 -------------------------------------------------------------------------------- Patient/Caregiver Education Details Patient Name: Date of Service: Marvin Atlas, [place] J. 3/21/2022andnbsp8:00 A M Medical Record Number: [id number] Patient Account Number: [id number] Date of Birth/Gender: Treating RN: 09-27-1960 (60 y.o. [name] Primary Care Physician: [name] Other Clinician: Referring Physician: Treating Physician/Extender: Randa Spike Weeks in Treatment: 6 Education Assessment Education Provided To: Patient Education Topics Provided Smoking and Wound Healing: Methods: Explain/Verbal Responses: State content correctly Venous: Methods: Explain/Verbal Responses: State content correctly Wound/Skin Impairment: Methods: Explain/Verbal Responses: State content correctly Electronic Signature(s) Signed: 07/01/2020 5:12:11 PM By: Levan Hurst RN, BSN Signed: 07/01/2020 5:12:11 PM By: Levan Hurst RN, BSN Entered By: Levan Hurst on 07/01/2020 08:03:12 -------------------------------------------------------------------------------- Wound Assessment Details Patient Name: Date of Service: Marvin Atlas, [place] J. 07/01/2020 8:00 A M Medical Record Number: [id number] Patient Account Number: [id number] Date of Birth/Sex: Treating RN: [birth year] (60 y.o. [name] Primary Care Provider: [name] Other Clinician: Referring Provider: Treating Provider/Extender: Talitha Givens HN Weeks in Treatment: 6 Wound Status Wound Number: 1 Primary Venous Leg Ulcer Etiology: Wound Location: Right, Lateral Lower Leg Wound  Healed - Epithelialized Wounding Event: Gradually Appeared Status: Date Acquired: 05/15/2019 Comorbid Anemia, Sleep Apnea, Congestive Heart Failure, Coronary Weeks Of Treatment: 6 History: Artery Disease, Osteoarthritis Clustered Wound: No Wound Measurements Length: (cm) Width: (cm) Depth: (cm) Area: (cm) Volume: (cm) 0 % Reduction in Area: 100% 0 % Reduction in Volume: 100% 0 Epithelialization: Large (67-100%) 0 Tunneling: No 0 Undermining: No Wound Description Classification: Full Thickness Without Exposed Support Structures Exudate Amount: None Present Foul Odor After Cleansing: No Slough/Fibrino No Wound Bed Granulation Amount: None Present (0%) Exposed Structure Necrotic Amount: None Present (0%) Fascia Exposed: No Fat Layer (Subcutaneous Tissue) Exposed: No Tendon Exposed: No Muscle Exposed: No Joint Exposed: No Bone Exposed: No Electronic Signature(s) Signed: 07/01/2020 4:14:31 PM By: Baruch Gouty RN, BSN Entered By: Baruch Gouty on 07/01/2020 08:15:19 -------------------------------------------------------------------------------- Wound Assessment Details Patient Name: Date of Service: Marvin Atlas, [place] J. 07/01/2020 8:00 A M Medical Record Number: [id number] Patient Account Number: [id number] Date of Birth/Sex: Treating RN: 09-01-1960 (60 y.o. [name] Primary Care Provider: [name] Other Clinician: Referring Provider: Treating Provider/Extender: Talitha Givens HN Weeks in Treatment: 6 Wound Status Wound Number: 3 Primary Venous Leg Ulcer Etiology: Wound Location: Left, Posterior Lower Leg Wound Open Wounding Event: Gradually Appeared Status: Status: Date Acquired: 05/15/2019 Comorbid Anemia, Sleep Apnea, Congestive Heart Failure, Coronary Weeks Of Treatment: 6 History: Artery Disease, Osteoarthritis Clustered Wound: No Photos Wound Measurements Length: (cm) 0.9 Width: (cm) 1 Depth: (cm) 0.1 Area: (cm)  0.707 Volume: (cm) 0.071 % Reduction in Area: 98.7% % Reduction in Volume: 98.7% Epithelialization: Small (1-33%) Tunneling: No Undermining: No Wound Description Classification: Full Thickness Without Exposed Support Structures Wound Margin: Flat and Intact Exudate Amount: Small Exudate Type: Serosanguineous Exudate Color: red, brown Foul Odor After Cleansing: No Slough/Fibrino Yes Wound  Bed Granulation Amount: Large (67-100%) Exposed Structure Granulation Quality: Red Fascia Exposed: No Necrotic Amount: None Present (0%) Fat Layer (Subcutaneous Tissue) Exposed: Yes Tendon Exposed: No Muscle Exposed: No Joint Exposed: No Bone Exposed: No Treatment Notes Wound #3 (Lower Leg) Wound Laterality: Left, Posterior Cleanser Soap and Water Discharge Instruction: May shower and wash wound with dial antibacterial soap and water prior to dressing change. Peri-Wound Care Triamcinolone 15 (g) Discharge Instruction: Use triamcinolone 15 (g) as directed Sween Lotion (Moisturizing lotion) Discharge Instruction: Apply moisturizing lotion as directed Topical Primary Dressing KerraCel Ag Gelling Fiber Dressing, 4x5 in (silver alginate) Discharge Instruction: Apply silver alginate to wound bed as instructed Secondary Dressing Woven Gauze Sponge, Non-Sterile 4x4 in Discharge Instruction: Apply over primary dressing as directed. ABD Pad, 8x10 Discharge Instruction: Apply over primary dressing as directed. Secured With Compression Wrap FourPress (4 layer compression wrap) Discharge Instruction: Apply four layer compression as directed. Unna boot under wrap to help secure in place Compression Stockings Add-Ons Electronic Signature(s) Signed: 07/01/2020 4:46:01 PM By: Sandre Kitty Signed: 07/02/2020 5:27:17 PM By: Baruch Gouty RN, BSN Previous Signature: 07/01/2020 4:14:31 PM Version By: Baruch Gouty RN, BSN Entered By: Sandre Kitty on 07/01/2020  16:20:17 -------------------------------------------------------------------------------- Vitals Details Patient Name: Date of Service: Marvin Atlas, [place] J. 07/01/2020 8:00 A M Medical Record Number: [id number] Patient Account Number: [id number] Date of Birth/Sex: Treating RN: 04-Sep-1960 (60 y.o. [name] Primary Care Provider: [name] Other Clinician: Referring Provider: Treating Provider/Extender: Talitha Givens HN Weeks in Treatment: 6 Vital Signs Time Taken: 08:01 Temperature (F): 97.6 Height (in): 71 Pulse (bpm): 67 Weight (lbs): 210 Respiratory Rate (breaths/min): 18 Body Mass Index (BMI): 29.3 Blood Pressure (mmHg): 104/62 Reference Range: 80 - 120 mg / dl Electronic Signature(s) Signed: 07/01/2020 4:46:01 PM By: Sandre Kitty Entered By: Sandre Kitty on 07/01/2020 08:04:16

## 2020-07-08 ENCOUNTER — Encounter (HOSPITAL_BASED_OUTPATIENT_CLINIC_OR_DEPARTMENT_OTHER): Payer: PPO | Admitting: Internal Medicine

## 2020-07-08 ENCOUNTER — Other Ambulatory Visit: Payer: Self-pay

## 2020-07-08 DIAGNOSIS — I872 Venous insufficiency (chronic) (peripheral): Secondary | ICD-10-CM | POA: Diagnosis not present

## 2020-07-08 DIAGNOSIS — I87333 Chronic venous hypertension (idiopathic) with ulcer and inflammation of bilateral lower extremity: Secondary | ICD-10-CM | POA: Diagnosis not present

## 2020-07-08 DIAGNOSIS — L97222 Non-pressure chronic ulcer of left calf with fat layer exposed: Secondary | ICD-10-CM | POA: Diagnosis not present

## 2020-07-08 NOTE — Progress Notes (Signed)
[name] ([id number]) Visit Report for 07/08/2020 Debridement Details Patient Name: Date of Service: [name], [name] 07/08/2020 8:00 A M Medical Record Number: [id number] Patient Account Number: [id number] Date of Birth/Sex: Treating RN: 14-Nov-1960 (60 y.o. [name] Primary Care Provider: [name] Other Clinician: Referring Provider: Treating Provider/Extender: Talitha Givens HN Weeks in Treatment: 7 Debridement Performed for Assessment: Wound #3 Left,Posterior Lower Leg Performed By: Physician Ricard Dillon., MD Debridement Type: Debridement Severity of Tissue Pre Debridement: Fat layer exposed Level of Consciousness (Pre-procedure): Awake and Alert Pre-procedure Verification/Time Out Yes - 08:20 Taken: Start Time: 08:20 Pain Control: Other : Benzocaine 20% T Area Debrided (L x W): otal 1 (cm) x 1 (cm) = 1 (cm) Tissue and other material debrided: Viable, Non-Viable, Subcutaneous Level: Skin/Subcutaneous Tissue Debridement Description: Excisional Instrument: Curette Bleeding: Minimum Hemostasis Achieved: Pressure End Time: 08:21 Procedural Pain: 4 Post Procedural Pain: 2 Response to Treatment: Procedure was tolerated well Level of Consciousness (Post- Awake and Alert procedure): Post Debridement Measurements of Total Wound Length: (cm) 1 Width: (cm) 1 Depth: (cm) 0.1 Volume: (cm) 0.079 Character of Wound/Ulcer Post Debridement: Improved Severity of Tissue Post Debridement: Fat layer exposed Post Procedure Diagnosis Same as Pre-procedure Electronic Signature(s) Signed: 07/08/2020 5:22:38 PM By: Linton Ham MD Signed: 07/08/2020 5:28:50 PM By: Levan Hurst RN, BSN Entered By: Linton Ham on 07/08/2020 08:51:31 -------------------------------------------------------------------------------- HPI Details Patient Name: Date of Service: Marvin Atlas, [place] UL J. 07/08/2020 8:00 A M Medical Record Number: [id number] Patient Account  Number: [id number] Date of Birth/Sex: Treating RN: [birth year] (60 y.o. [name] Primary Care Provider: [name] Other Clinician: Referring Provider: Treating Provider/Extender: Talitha Givens HN Weeks in Treatment: 7 History of Present Illness HPI Description: ADMISSION 05/20/2020 This is a 60 year old man who is not a diabetic however he has a long history of chronic venous insufficiency with wounds on his lower extremities. He went to the wound care center in [place] for a long period of time with recurrent wounds in these areas treated with compression wraps. He comes in today with wounds on his bilateral lower legs that he says have been there about a year on the right and about 6 months on the left. He went to see Dr. Oneida Alar on 01/17/2020 who noted that he had no history of DVT but does have a history of venous insufficiency wounds. They noted that he was considering laser ablation in October 2020 but he canceled the procedure. He had an episode of bleeding from varicose veins in 2019. He had repeated venous reflux study on 10/6 which showed no evidence of a DVT or SVT Noted that he had venous reflux in the right greater saphenous vein in the thigh and the calf on the right. Also . noted to have venous reflux in the right femoral vein right popliteal vein and right small saphenous vein. At the time in October they wanted the wounds to heal a bit he also also had a right groin wound which is apparently closed. Not sure about the follow-up with vein and vascular. He comes in today with some light compression wraps and collagen on the wound placed by Dr. Lilia Pro who is a Education officer, environmental in [place]. He has 2 areas on the left leg 1 anteriorly and a large area posteriorly and a smaller area on the right anterior lower leg. He has marked lower extremity edema which is nonpitting. Hemosiderin deposition on both lower legs  extensively. There is dilated venules in his  feet. Past medical history includes history of opioid abuse in remission, left nephrolithiasis, history of an ablation although I did not see this in Dr. Oneida Alar notes, congestive heart failure and anemia ABIs in our clinic were noncompressible bilaterally 05/27/2020; we admitted this patient to clinic last week. He has bilateral superficial venous insufficiency with bilateral wounds left greater than right. We put him in 4 layer compression with silver alginate. The he was previously cared for in the [facility] wound care clinic when it was open. His wounds are better with compression this week and there is certainly less swelling. 2/21; bilateral superficial venous insufficiency wounds. A cluster on the left lateral. He has very well-defined areas on the right anterior right and right lateral. We are using silver alginate and 4-layer compression 2/28; bilateral superficial venous insufficiency wounds in the setting of significant stage III lymphedema. He has a cluster on the left which is smaller. His area on the right anterior and right lateral also look better. 3/14 superficial bilateral venous insufficiency wounds. Much better on both sides in fact the area on the right posterior is healed. He still has 2 small open areas on the right anterior and the left lateral 3/21; superficial bilateral venous insufficiency wounds. Everything is closed on the right side. Small area still remains on the left. We will transition him today to his right extremiteaze stocking. Still dressing the remaining wound on the left with compression 3/28; the patient's right leg remains closed in his wraparound stocking. Disappointingly today the area on the left lateral leg looks about the same. I thought this might be closed with been using silver alginate Electronic Signature(s) Signed: 07/08/2020 5:22:38 PM By: Linton Ham MD Entered By: Linton Ham on 07/08/2020  08:52:13 -------------------------------------------------------------------------------- Physical Exam Details Patient Name: Date of Service: Marvin Atlas, [place] UL J. 07/08/2020 8:00 A M Medical Record Number: [id number] Patient Account Number: [id number] Date of Birth/Sex: Treating RN: 12-16-1960 (60 y.o. [name] Primary Care Provider: [name] Other Clinician: Referring Provider: Treating Provider/Extender: Talitha Givens HN Weeks in Treatment: 7 Constitutional Sitting or standing Blood Pressure is within target range for patient.. Pulse regular and within target range for patient.Marland Kitchen Respirations regular, non-labored and within target range.. Temperature is normal and within the target range for the patient.Marland Kitchen Appears in no distress. Notes Wound exam; there is only 1 wound left on the left lateral leg. This is about the same last week as last week however. I used a #3 curette to remove some dry skin from the circumference and adherent fibrinous debris on the surface hemostasis with direct pressure. He has severe chronic venous insufficiency, I was not completely happy with the degree of edema control Electronic Signature(s) Signed: 07/08/2020 5:22:38 PM By: Linton Ham MD Entered By: Linton Ham on 07/08/2020 08:53:11 -------------------------------------------------------------------------------- Physician Orders Details Patient Name: Date of Service: Marvin Atlas, [place] UL J. 07/08/2020 8:00 A M Medical Record Number: [id number] Patient Account Number: [id number] Date of Birth/Sex: Treating RN: February 03, 1961 (60 y.o. [name] Primary Care Provider: [name] Other Clinician: Referring Provider: Treating Provider/Extender: Talitha Givens HN Weeks in Treatment: 7 Verbal / Phone Orders: No Diagnosis Coding ICD-10 Coding Code Description [phone] Chronic venous hypertension (idiopathic) with ulcer and inflammation of bilateral  lower extremity I89.0 Lymphedema, not elsewhere classified L97.811 Non-pressure chronic ulcer of other part of right lower leg limited to breakdown of skin L97.821 Non-pressure  chronic ulcer of other part of left lower leg limited to breakdown of skin Follow-up Appointments Return Appointment in 1 week. Bathing/ Shower/ Hygiene May shower with protection but do not get wound dressing(s) wet. Edema Control - Lymphedema / SCD / Other Bilateral Lower Extremities Elevate legs to the level of the heart or above for 30 minutes daily and/or when sitting, a frequency of: - throughout the day Avoid standing for long periods of time. Exercise regularly Moisturize legs daily. Compression stocking or Garment 30-40 mm/Hg pressure to: - Extremit-ease to right leg daily. Apply first thing in the morning, remove at night before bed. Wound Treatment Wound #3 - Lower Leg Wound Laterality: Left, Posterior Cleanser: Soap and Water 1 x Per Week Discharge Instructions: May shower and wash wound with dial antibacterial soap and water prior to dressing change. Peri-Wound Care: Triamcinolone 15 (g) 1 x Per Week Discharge Instructions: Use triamcinolone 15 (g) as directed Peri-Wound Care: Sween Lotion (Moisturizing lotion) 1 x Per Week Discharge Instructions: Apply moisturizing lotion as directed Prim Dressing: Hydrofera Blue Ready Foam, 2.5 x2.5 in 1 x Per Week ary Discharge Instructions: Apply to wound bed as instructed Secondary Dressing: Woven Gauze Sponge, Non-Sterile 4x4 in 1 x Per Week Discharge Instructions: Apply over primary dressing as directed. Secondary Dressing: ABD Pad, 8x10 1 x Per Week Discharge Instructions: Fold ABD pad in half to apply more pressure to wound area Compression Wrap: FourPress (4 layer compression wrap) 1 x Per Week Discharge Instructions: Apply four layer compression as directed. Compression Wrap: Unna boot under wrap to help secure in place 1 x Per [employer]) Signed: 07/08/2020 5:22:38 PM By: Linton Ham MD Signed: 07/08/2020 5:28:50 PM By: Levan Hurst RN, BSN Entered By: Levan Hurst on 07/08/2020 08:24:04 -------------------------------------------------------------------------------- Problem List Details Patient Name: Date of Service: Marvin Atlas, [place] UL J. 07/08/2020 8:00 A M Medical Record Number: [id number] Patient Account Number: [id number] Date of Birth/Sex: Treating RN: Sep 30, 1960 (60 y.o. [name] Primary Care Provider: Other Clinician: [name] Referring Provider: Treating Provider/Extender: Talitha Givens HN Weeks in Treatment: 7 Active Problems ICD-10 Encounter Code Description Active Date MDM Diagnosis I87.333 Chronic venous hypertension (idiopathic) with ulcer and inflammation of 05/20/2020 No Yes bilateral lower extremity I89.0 Lymphedema, not elsewhere classified 05/20/2020 No Yes L97.811 Non-pressure chronic ulcer of other part of right lower leg limited to breakdown 05/20/2020 No Yes of skin L97.821 Non-pressure chronic ulcer of other part of left lower leg limited to breakdown 05/20/2020 No Yes of skin Inactive Problems Resolved Problems Electronic Signature(s) Signed: 07/08/2020 5:22:38 PM By: Linton Ham MD Entered By: Linton Ham on 07/08/2020 08:50:16 -------------------------------------------------------------------------------- Progress Note Details Patient Name: Date of Service: Marvin Atlas, [place] UL J. 07/08/2020 8:00 A M Medical Record Number: [id number] Patient Account Number: [id number] Date of Birth/Sex: Treating RN: [birth year] (60 y.o. [name] Primary Care Provider: [name] Other Clinician: Referring Provider: Treating Provider/Extender: Talitha Givens HN Weeks in Treatment: 7 Subjective History of Present Illness (HPI) ADMISSION 05/20/2020 This is a 60 year old man who is not a diabetic however he has a long history of  chronic venous insufficiency with wounds on his lower extremities. He went to the wound care center in [place] for a long period of time with recurrent wounds in these areas treated with compression wraps. He comes in today with wounds on his bilateral lower legs that he says have been there about a year on the right and  about 6 months on the left. He went to see Dr. Oneida Alar on 01/17/2020 who noted that he had no history of DVT but does have a history of venous insufficiency wounds. They noted that he was considering laser ablation in October 2020 but he canceled the procedure. He had an episode of bleeding from varicose veins in 2019. He had repeated venous reflux study on 10/6 which showed no evidence of a DVT or SVT Noted that he had venous reflux in the right greater saphenous vein in the thigh and the calf on the right. Also . noted to have venous reflux in the right femoral vein right popliteal vein and right small saphenous vein. At the time in October they wanted the wounds to heal a bit he also also had a right groin wound which is apparently closed. Not sure about the follow-up with vein and vascular. He comes in today with some light compression wraps and collagen on the wound placed by Dr. Lilia Pro who is a Education officer, environmental in [place]. He has 2 areas on the left leg 1 anteriorly and a large area posteriorly and a smaller area on the right anterior lower leg. He has marked lower extremity edema which is nonpitting. Hemosiderin deposition on both lower legs extensively. There is dilated venules in his feet. Past medical history includes history of opioid abuse in remission, left nephrolithiasis, history of an ablation although I did not see this in Dr. Oneida Alar notes, congestive heart failure and anemia ABIs in our clinic were noncompressible bilaterally 05/27/2020; we admitted this patient to clinic last week. He has bilateral superficial venous insufficiency with bilateral wounds left greater  than right. We put him in 4 layer compression with silver alginate. The he was previously cared for in the [facility] wound care clinic when it was open. His wounds are better with compression this week and there is certainly less swelling. 2/21; bilateral superficial venous insufficiency wounds. A cluster on the left lateral. He has very well-defined areas on the right anterior right and right lateral. We are using silver alginate and 4-layer compression 2/28; bilateral superficial venous insufficiency wounds in the setting of significant stage III lymphedema. He has a cluster on the left which is smaller. His area on the right anterior and right lateral also look better. 3/14 superficial bilateral venous insufficiency wounds. Much better on both sides in fact the area on the right posterior is healed. He still has 2 small open areas on the right anterior and the left lateral 3/21; superficial bilateral venous insufficiency wounds. Everything is closed on the right side. Small area still remains on the left. We will transition him today to his right extremiteaze stocking. Still dressing the remaining wound on the left with compression 3/28; the patient's right leg remains closed in his wraparound stocking. Disappointingly today the area on the left lateral leg looks about the same. I thought this might be closed with been using silver alginate Objective Constitutional Sitting or standing Blood Pressure is within target range for patient.. Pulse regular and within target range for patient.Marland Kitchen Respirations regular, non-labored and within target range.. Temperature is normal and within the target range for the patient.Marland Kitchen Appears in no distress. Vitals Time Taken: 7:56 AM, Height: 71 in, Weight: 210 lbs, BMI: 29.3, Temperature: 98.8 F, Pulse: 67 bpm, Respiratory Rate: 18 breaths/min, Blood Pressure: 108/71 mmHg. General Notes: Wound exam; there is only 1 wound left on the left lateral leg. This is about  the same last week as last  week however. I used a #3 curette to remove some dry skin from the circumference and adherent fibrinous debris on the surface hemostasis with direct pressure. He has severe chronic venous insufficiency, I was not completely happy with the degree of edema control Integumentary (Hair, Skin) Wound #3 status is Open. Original cause of wound was Gradually Appeared. The date acquired was: 05/15/2019. The wound has been in treatment 7 weeks. The wound is located on the Left,Posterior Lower Leg. The wound measures 1cm length x 1cm width x 0.1cm depth; 0.785cm^2 area and 0.079cm^3 volume. 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 There was a Excisional Skin/Subcutaneous Tissue Debridement with a total area of 1 sq cm performed by Ricard Dillon., MD. With the following instrument(s): Curette to remove Viable and Non-Viable tissue/material. Material removed includes Subcutaneous Tissue after achieving pain control using Other (Benzocaine 20%). No specimens were taken. A time out was conducted at 08:20, prior to the start of the procedure. A Minimum amount of bleeding was controlled with Pressure. The procedure was tolerated well with a pain level of 4 throughout and a pain level of 2 following the procedure. Post Debridement Measurements: 1cm length x 1cm width x 0.1cm depth; 0.079cm^3 volume. Character of Wound/Ulcer Post Debridement is improved. Severity of Tissue Post Debridement is: Fat layer  exposed. Post procedure Diagnosis Wound #3: Same as Pre-Procedure Pre-procedure diagnosis of Wound #3 is a Venous Leg Ulcer located on the Left,Posterior Lower Leg . There was a Four Layer Compression Therapy Procedure by Levan Hurst, RN. Post procedure Diagnosis Wound #3: Same as Pre-Procedure Plan Follow-up Appointments: Return Appointment in 1 week. Bathing/ Shower/ Hygiene: May shower with protection but do not get wound dressing(s) wet. Edema Control - Lymphedema / SCD / Other: Elevate legs to the level of the heart or above for 30 minutes daily and/or when sitting, a frequency of: - throughout the day Avoid standing for long periods of time. Exercise regularly Moisturize legs daily. Compression stocking or Garment 30-40 mm/Hg pressure to: - Extremit-ease to right leg daily. Apply first thing in the morning, remove at night before bed. WOUND #3: - Lower Leg Wound Laterality: Left, Posterior Cleanser: Soap and Water 1 x Per Week/ Discharge Instructions: May shower and wash wound with dial antibacterial soap and water prior to dressing change. Peri-Wound Care: Triamcinolone 15 (g) 1 x Per Week/ Discharge Instructions: Use triamcinolone 15 (g) as directed Peri-Wound Care: Sween Lotion (Moisturizing lotion) 1 x Per Week/ Discharge Instructions: Apply moisturizing lotion as directed Prim Dressing: Hydrofera Blue Ready Foam, 2.5 x2.5 in 1 x Per Week/ ary Discharge Instructions: Apply to wound bed as instructed Secondary Dressing: Woven Gauze Sponge, Non-Sterile 4x4 in 1 x Per Week/ Discharge Instructions: Apply over primary dressing as directed. Secondary Dressing: ABD Pad, 8x10 1 x Per Week/ Discharge Instructions: Fold ABD pad in half to apply more pressure to wound area Com pression Wrap: FourPress (4 layer compression wrap) 1 x Per Week/ Discharge Instructions: Apply four layer compression as directed. Com pression Wrap: Unna boot under wrap to help secure in place 1 x  Per  Week/ 1. I change the primary dressing to Hydrofera Blue to help with ongoing debridement and hopefully allow epithelialization. 2. I also rolled some ABD pads over the area to get better edema control around the wound Electronic Signature(s) Signed: 07/08/2020 5:22:38 PM By: Linton Ham MD Entered By: Linton Ham on 07/08/2020 08:54:03 -------------------------------------------------------------------------------- SuperBill Details Patient Name: Date of Service: Marvin Atlas, PA UL J. 07/08/2020 Medical Record Number: [id number] Patient Account Number: [id number] Date of Birth/Sex: Treating RN: [birth year] (60 y.o. [name] Primary Care Provider: [name] Other Clinician: Referring Provider: Treating Provider/Extender: Talitha Givens HN Weeks in Treatment: 7 Diagnosis Coding ICD-10 Codes Code Description [phone] Chronic venous hypertension (idiopathic) with ulcer and inflammation of bilateral lower extremity I89.0 Lymphedema, not elsewhere classified L97.811 Non-pressure chronic ulcer of other part of right lower leg limited to breakdown of skin L97.821 Non-pressure chronic ulcer of other part of left lower leg limited to breakdown of skin Facility Procedures CPT4 Code: [id number] Description: 71595 - DEB SUBQ TISSUE 20 SQ CM/< ICD-10 Diagnosis Description L97.821 Non-pressure chronic ulcer of other part of left lower leg limited to breakdown Modifier: of skin Quantity: 1 Physician Procedures : CPT4 Code Description Modifier [id number] - WC PHYS SUBQ TISS 20 SQ CM ICD-10 Diagnosis Description L97.821 Non-pressure chronic ulcer of other part of left lower leg limited to breakdown of skin Quantity: 1 Electronic Signature(s) Signed: 07/08/2020 5:22:38 PM By: Linton Ham MD Entered By: Linton Ham on 07/08/2020 08:54:16

## 2020-07-10 NOTE — Progress Notes (Signed)
CRIXUS, MCAULAY ([id number]) Visit Report for 07/08/2020 Arrival Information Details Patient Name: Date of Service: [name], Marvin [place] J. 07/08/2020 8:00 A M Medical Record Number: [id number] Patient Account Number: [id number] Date of Birth/Sex: Treating RN: 19-Feb-1961 (60 y.o. [name] Primary Care Stevie Charter: [name] Other Clinician: Referring Stephano Arrants: Treating Aman Batley/Extender: Talitha Givens HN Weeks in Treatment: 7 Visit Information History Since Last Visit Added or deleted any medications: No Patient Arrived: Ambulatory Any new allergies or adverse reactions: No Arrival Time: 07:56 Had a fall or experienced change in No Accompanied By: self activities of daily living that may affect Transfer Assistance: None risk of falls: Patient Identification Verified: Yes Signs or symptoms of abuse/neglect since last visito No Secondary Verification Process Completed: Yes Hospitalized since last visit: No Patient Requires Transmission-Based Precautions: No Implantable device outside of the clinic excluding No Patient Has Alerts: Yes cellular tissue based products placed in the center Patient Alerts: ABI's R: N/C since last visit: Has Dressing in Place as Prescribed: Yes Pain Present Now: No Electronic Signature(s) Signed: 07/10/2020 9:05:34 AM By: Sandre Kitty Entered By: Sandre Kitty on 07/08/2020 07:56:22 -------------------------------------------------------------------------------- Compression Therapy Details Patient Name: Date of Service: Marvin Atlas, PA UL J. 07/08/2020 8:00 A M Medical Record Number: [id number] Patient Account Number: [id number] Date of Birth/Sex: Treating RN: [birth year] (60 y.o. [name] Primary Care Langley Flatley: [name] Other Clinician: Referring Aloise Copus: Treating Reyann Troop/Extender: Talitha Givens HN Weeks in Treatment: 7 Compression Therapy Performed for Wound Assessment: Wound #3  Left,Posterior Lower Leg Performed By: Clinician Levan Hurst, RN Compression Type: Four Layer Post Procedure Diagnosis Same as Pre-procedure Electronic Signature(s) Signed: 07/08/2020 5:28:50 PM By: Levan Hurst RN, BSN Entered By: Levan Hurst on 07/08/2020 08:21:30 -------------------------------------------------------------------------------- Encounter Discharge Information Details Patient Name: Date of Service: Marvin Atlas, PA UL J. 07/08/2020 8:00 A M Medical Record Number: [id number] Patient Account Number: [id number] Date of Birth/Sex: Treating RN: April 18, 1960 (60 y.o. [name] Primary Care Taunja Brickner: [name] Other Clinician: Referring Izyan Ezzell: Treating Lauretta Sallas/Extender: Talitha Givens HN Weeks in Treatment: 7 Encounter Discharge Information Items Post Procedure Vitals Discharge Condition: Stable Temperature (F): 98.8 Ambulatory Status: Ambulatory Pulse (bpm): 67 Discharge Destination: Home Respiratory Rate (breaths/min): 18 Transportation: Private Auto Blood Pressure (mmHg): 108/71 Schedule Follow-up Appointment: Yes Clinical Summary of Care: Provided on 07/08/2020 Form Type Recipient Paper Patient Patieint Electronic Signature(s) Signed: 07/08/2020 5:20:59 PM By: Lorrin Jackson Entered By: Lorrin Jackson on 07/08/2020 08:43:34 -------------------------------------------------------------------------------- Lower Extremity Assessment Details Patient Name: Date of Service: Marvin Atlas, PA UL J. 07/08/2020 8:00 A M Medical Record Number: [id number] Patient Account Number: [id number] Date of Birth/Sex: Treating RN: [birth year] (60 y.o. [name] Primary Care Copper Basnett: [name] Other Clinician: Referring Danniella Robben: Treating Saraiya Kozma/Extender: Talitha Givens HN Weeks in Treatment: 7 Edema Assessment Assessed: [Left: No] [Right: No] Edema: [Left: Yes] [Right: Yes] Calf Left: Right: Point of  Measurement: From Medial Instep 32.5 cm 34.4 cm Ankle Left: Right: Point of Measurement: From Medial Instep 23.5 cm 23.5 cm Vascular Assessment Pulses: Dorsalis Pedis Palpable: [Left:Yes] Electronic Signature(s) Signed: 07/08/2020 5:28:50 PM By: Levan Hurst RN, BSN Entered By: Levan Hurst on 07/08/2020 08:08:07 -------------------------------------------------------------------------------- Multi Wound Chart Details Patient Name: Date of Service: Marvin Atlas, PA UL J. 07/08/2020 8:00 A M Medical Record Number: [id number] Patient Account Number: [id number] Date of Birth/Sex: Treating RN: 05/26/1960 (60 y.o. [name] Primary Care Ahmoni Edge: Griffin Basil,  JO HN Other Clinician: Referring Alverta Caccamo: Treating Bralyn Folkert/Extender: Lyda Perone, JO HN Weeks in Treatment: 7 Vital Signs Height(in): 71 Pulse(bpm): 82 Weight(lbs): 210 Blood Pressure(mmHg): 108/71 Body Mass Index(BMI): 29 Temperature(F): 98.8 Respiratory Rate(breaths/min): 18 Photos: [3:No Photos Left, Posterior Lower Leg] [N/A:N/A N/A] Wound Location: [3:Gradually Appeared] [N/A:N/A] Wounding Event: [3:Venous Leg Ulcer] [N/A:N/A] Primary Etiology: [3:05/15/2019] [N/A:N/A] Date Acquired: [3:7] [N/A:N/A] Weeks of Treatment: [3:Open] [N/A:N/A] Wound Status: [3:1x1x0.1] [N/A:N/A] Measurements L x W x D (cm) [3:0.785] [N/A:N/A] A (cm) : rea [3:0.079] [N/A:N/A] Volume (cm) : [3:98.60%] [N/A:N/A] % Reduction in A rea: [3:98.60%] [N/A:N/A] % Reduction in Volume: [3:Full Thickness Without Exposed] [N/A:N/A] Classification: [3:Support Structures Debridement - Excisional] [N/A:N/A] Debridement: Pre-procedure Verification/Time Out 08:20 [N/A:N/A] Taken: [3:Other] [N/A:N/A] Pain Control: [3:Subcutaneous] [N/A:N/A] Tissue Debrided: [3:Skin/Subcutaneous Tissue] [N/A:N/A] Level: [3:1] [N/A:N/A] Debridement A (sq cm): [3:rea Curette] [N/A:N/A] Instrument: [3:Minimum] [N/A:N/A] Bleeding: [3:Pressure]  [N/A:N/A] Hemostasis A chieved: [3:4] [N/A:N/A] Procedural Pain: [3:2] [N/A:N/A] Post Procedural Pain: [3:Procedure was tolerated well] [N/A:N/A] Debridement Treatment Response: [3:1x1x0.1] [N/A:N/A] Post Debridement Measurements L x W x D (cm) [3:0.079] [N/A:N/A] Post Debridement Volume: (cm) [3:Compression Therapy] [N/A:N/A] Procedures Performed: [3:Debridement] Treatment Notes Wound #3 (Lower Leg) Wound Laterality: Left, Posterior Cleanser Soap and Water Discharge Instruction: May shower and wash wound with dial antibacterial soap and water prior to dressing change. Peri-Wound Care Triamcinolone 15 (g) Discharge Instruction: Use triamcinolone 15 (g) as directed Sween Lotion (Moisturizing lotion) Discharge Instruction: Apply moisturizing lotion as directed Topical Primary Dressing Hydrofera Blue Ready Foam, 2.5 x2.5 in Discharge Instruction: Apply to wound bed as instructed Secondary Dressing Woven Gauze Sponge, Non-Sterile 4x4 in Discharge Instruction: Apply over primary dressing as directed. ABD Pad, 8x10 Discharge Instruction: Fold ABD pad in half to apply more pressure to wound area Secured With Compression Wrap FourPress (4 layer compression wrap) Discharge Instruction: Apply four layer compression as directed. Unna boot under wrap to help secure in place Compression Stockings Add-Ons Electronic Signature(s) Signed: 07/08/2020 5:22:38 PM By: Linton Ham MD Signed: 07/08/2020 5:28:50 PM By: Levan Hurst RN, BSN Entered By: Linton Ham on 07/08/2020 08:50:22 -------------------------------------------------------------------------------- Multi-Disciplinary Care Plan Details Patient Name: Date of Service: Marvin Atlas, PA UL J. 07/08/2020 8:00 A M Medical Record Number: [id number] Patient Account Number: [id number] Date of Birth/Sex: Treating RN: [birth year] (60 y.o. [name] Primary Care Lynn Recendiz: [name] Other Clinician: Referring  Trina Asch: Treating Kenry Daubert/Extender: Talitha Givens HN Weeks in Treatment: 7 Multidisciplinary Care Plan reviewed with physician Active Inactive Venous Leg Ulcer Nursing Diagnoses: Actual venous Insuffiency (use after diagnosis is confirmed) Knowledge deficit related to disease process and management Goals: Patient will maintain optimal edema control Date Initiated: 05/20/2020 Target Resolution Date: 08/05/2020 Goal Status: Active Patient/caregiver will verbalize understanding of disease process and disease management Date Initiated: 05/20/2020 Target Resolution Date: 08/05/2020 Goal Status: Active Interventions: Assess peripheral edema status every visit. Compression as ordered Provide education on venous insufficiency Notes: 06/24/20: Edema control and wound management ongoing. Wound/Skin Impairment Nursing Diagnoses: Impaired tissue integrity Knowledge deficit related to smoking impact on wound healing Knowledge deficit related to ulceration/compromised skin integrity Goals: Patient will demonstrate a reduced rate of smoking or cessation of smoking Date Initiated: 05/20/2020 Target Resolution Date: 08/05/2020 Goal Status: Active Patient/caregiver will verbalize understanding of skin care regimen Date Initiated: 05/20/2020 Target Resolution Date: 08/05/2020 Goal Status: Active Ulcer/skin breakdown will have a volume reduction of 30% by week 4 Date Initiated: 05/20/2020 Date Inactivated: 06/24/2020 Target Resolution Date: 06/21/2020 Goal Status: Met Ulcer/skin breakdown will  have a volume reduction of 50% by week 8 Date Initiated: 06/24/2020 Target Resolution Date: 07/22/2020 Goal Status: Active Interventions: Assess patient/caregiver ability to obtain necessary supplies Assess patient/caregiver ability to perform ulcer/skin care regimen upon admission and as needed Assess ulceration(s) every visit Provide education on smoking Provide education on ulcer and skin  care Notes: Electronic Signature(s) Signed: 07/08/2020 5:28:50 PM By: Levan Hurst RN, BSN Entered By: Levan Hurst on 07/08/2020 07:52:14 -------------------------------------------------------------------------------- Pain Assessment Details Patient Name: Date of Service: Marvin Atlas, PA UL J. 07/08/2020 8:00 A M Medical Record Number: [id number] Patient Account Number: [id number] Date of Birth/Sex: Treating RN: 1960/05/27 (60 y.o. [name] Primary Care Dontarius Sheley: [name] Other Clinician: Referring Koraima Albertsen: Treating Naila Elizondo/Extender: Talitha Givens HN Weeks in Treatment: 7 Active Problems Location of Pain Severity and Description of Pain Patient Has Paino No Site Locations Pain Management and Medication Current Pain Management: Electronic Signature(s) Signed: 07/08/2020 5:28:50 PM By: Levan Hurst RN, BSN Signed: 07/10/2020 9:05:34 AM By: Sandre Kitty Entered By: Sandre Kitty on 07/08/2020 07:56:43 -------------------------------------------------------------------------------- Patient/Caregiver Education Details Patient Name: Date of Service: Marvin Atlas, PA UL J. 3/28/2022andnbsp8:00 A M Medical Record Number: [id number] Patient Account Number: [id number] Date of Birth/Gender: Treating RN: 1960/11/15 (60 y.o. [name] Primary Care Physician: [name] Other Clinician: Referring Physician: Treating Physician/Extender: Randa Spike Weeks in Treatment: 7 Education Assessment Education Provided To: Patient Education Topics Provided Venous: Methods: Explain/Verbal Responses: State content correctly Wound/Skin Impairment: Methods: Explain/Verbal Responses: State content correctly Electronic Signature(s) Signed: 07/08/2020 5:28:50 PM By: Levan Hurst RN, BSN Entered By: Levan Hurst on 07/08/2020  07:52:31 -------------------------------------------------------------------------------- Wound Assessment Details Patient Name: Date of Service: Marvin Atlas, PA UL J. 07/08/2020 8:00 A M Medical Record Number: [id number] Patient Account Number: [id number] Date of Birth/Sex: Treating RN: 21-Jan-1961 (60 y.o. [name] Primary Care Joelene Barriere: [name] Other Clinician: Referring Hollister Wessler: Treating Denine Brotz/Extender: Talitha Givens HN Weeks in Treatment: 7 Wound Status Wound Number: 3 Primary Venous Leg Ulcer Etiology: Wound Location: Left, Posterior Lower Leg Wound Open Wounding Event: Gradually Appeared Status: Date Acquired: 05/15/2019 Comorbid Anemia, Sleep Apnea, Congestive Heart Failure, Coronary Weeks Of Treatment: 7 History: Artery Disease, Osteoarthritis Clustered Wound: No Photos Wound Measurements Length: (cm) 1 Width: (cm) 1 Depth: (cm) 0.1 Area: (cm) 0.785 Volume: (cm) 0.079 % Reduction in Area: 98.6% % Reduction in Volume: 98.6% Epithelialization: Small (1-33%) Wound Description Classification: Full Thickness Without Exposed Support Structures Wound Margin: Flat and Intact Exudate Amount: Small Exudate Type: Serosanguineous Exudate Color: red, brown Foul Odor After Cleansing: No Slough/Fibrino Yes Wound Bed Granulation Amount: Large (67-100%) Exposed Structure Granulation Quality: Red Fascia Exposed: No Necrotic Amount: None Present (0%) Fat Layer (Subcutaneous Tissue) Exposed: Yes Tendon Exposed: No Muscle Exposed: No Joint Exposed: No Bone Exposed: No Treatment Notes Wound #3 (Lower Leg) Wound Laterality: Left, Posterior Cleanser Soap and Water Discharge Instruction: May shower and wash wound with dial antibacterial soap and water prior to dressing change. Peri-Wound Care Triamcinolone 15 (g) Discharge Instruction: Use triamcinolone 15 (g) as directed Sween Lotion (Moisturizing lotion) Discharge Instruction: Apply  moisturizing lotion as directed Topical Primary Dressing Hydrofera Blue Ready Foam, 2.5 x2.5 in Discharge Instruction: Apply to wound bed as instructed Secondary Dressing Woven Gauze Sponge, Non-Sterile 4x4 in Discharge Instruction: Apply over primary dressing as directed. ABD Pad, 8x10 Discharge Instruction: Fold ABD pad in half to apply more pressure to wound area Secured With Compression Wrap  FourPress (4 layer compression wrap) Discharge Instruction: Apply four layer compression as directed. Unna boot under wrap to help secure in place Compression Stockings Add-Ons Electronic Signature(s) Signed: 07/08/2020 5:28:50 PM By: Levan Hurst RN, BSN Signed: 07/10/2020 9:05:34 AM By: Sandre Kitty Entered By: Sandre Kitty on 07/08/2020 16:45:52 -------------------------------------------------------------------------------- Vitals Details Patient Name: Date of Service: Marvin Atlas, PA UL J. 07/08/2020 8:00 A M Medical Record Number: [id number] Patient Account Number: [id number] Date of Birth/Sex: Treating RN: 1960-09-07 (60 y.o. [name] Primary Care Lilie Vezina: [name] Other Clinician: Referring Marquetta Weiskopf: Treating Anahli Arvanitis/Extender: Talitha Givens HN Weeks in Treatment: 7 Vital Signs Time Taken: 07:56 Temperature (F): 98.8 Height (in): 71 Pulse (bpm): 67 Weight (lbs): 210 Respiratory Rate (breaths/min): 18 Body Mass Index (BMI): 29.3 Blood Pressure (mmHg): 108/71 Reference Range: 80 - 120 mg / dl Electronic Signature(s) Signed: 07/10/2020 9:05:34 AM By: Sandre Kitty Entered By: Sandre Kitty on 07/08/2020 07:56:37

## 2020-07-15 ENCOUNTER — Other Ambulatory Visit: Payer: Self-pay

## 2020-07-15 ENCOUNTER — Encounter (HOSPITAL_BASED_OUTPATIENT_CLINIC_OR_DEPARTMENT_OTHER): Payer: PPO | Attending: Internal Medicine | Admitting: Internal Medicine

## 2020-07-15 DIAGNOSIS — F1111 Opioid abuse, in remission: Secondary | ICD-10-CM | POA: Diagnosis not present

## 2020-07-15 DIAGNOSIS — L97821 Non-pressure chronic ulcer of other part of left lower leg limited to breakdown of skin: Secondary | ICD-10-CM | POA: Diagnosis not present

## 2020-07-15 DIAGNOSIS — I890 Lymphedema, not elsewhere classified: Secondary | ICD-10-CM | POA: Insufficient documentation

## 2020-07-15 DIAGNOSIS — I87312 Chronic venous hypertension (idiopathic) with ulcer of left lower extremity: Secondary | ICD-10-CM | POA: Diagnosis not present

## 2020-07-15 DIAGNOSIS — L97222 Non-pressure chronic ulcer of left calf with fat layer exposed: Secondary | ICD-10-CM | POA: Diagnosis not present

## 2020-07-15 DIAGNOSIS — L97811 Non-pressure chronic ulcer of other part of right lower leg limited to breakdown of skin: Secondary | ICD-10-CM | POA: Insufficient documentation

## 2020-07-15 DIAGNOSIS — I87333 Chronic venous hypertension (idiopathic) with ulcer and inflammation of bilateral lower extremity: Secondary | ICD-10-CM | POA: Insufficient documentation

## 2020-07-15 NOTE — Progress Notes (Signed)
[name], [name] ([id number]) Visit Report for 07/15/2020 HPI Details Patient Name: Date of Service: [name], [name] 07/15/2020 8:00 A M Medical Record Number: [id number] Patient Account Number: [id number] Date of Birth/Sex: Treating RN: 26-Nov-1960 (60 y.o. [name] Primary Care Provider: [name] Other Clinician: Referring Provider: Treating Provider/Extender: Talitha Givens HN Weeks in Treatment: 8 History of Present Illness HPI Description: ADMISSION 05/20/2020 This is a 60 year old man who is not a diabetic however he has a long history of chronic venous insufficiency with wounds on his lower extremities. He went to the wound care center in [place] for a long period of time with recurrent wounds in these areas treated with compression wraps. He comes in today with wounds on his bilateral lower legs that he says have been there about a year on the right and about 6 months on the left. He went to see Dr. Oneida Alar on 01/17/2020 who noted that he had no history of DVT but does have a history of venous insufficiency wounds. They noted that he was considering laser ablation in October 2020 but he canceled the procedure. He had an episode of bleeding from varicose veins in 2019. He had repeated venous reflux study on 10/6 which showed no evidence of a DVT or SVT Noted that he had venous reflux in the right greater saphenous vein in the thigh and the calf on the right. Also . noted to have venous reflux in the right femoral vein right popliteal vein and right small saphenous vein. At the time in October they wanted the wounds to heal a bit he also also had a right groin wound which is apparently closed. Not sure about the follow-up with vein and vascular. He comes in today with some light compression wraps and collagen on the wound placed by Dr. Lilia Pro who is a Education officer, environmental in [place]. He has 2 areas on the left leg 1 anteriorly and a large area posteriorly and a  smaller area on the right anterior lower leg. He has marked lower extremity edema which is nonpitting. Hemosiderin deposition on both lower legs extensively. There is dilated venules in his feet. Past medical history includes history of opioid abuse in remission, left nephrolithiasis, history of an ablation although I did not see this in Dr. Oneida Alar notes, congestive heart failure and anemia ABIs in our clinic were noncompressible bilaterally 05/27/2020; we admitted this patient to clinic last week. He has bilateral superficial venous insufficiency with bilateral wounds left greater than right. We put him in 4 layer compression with silver alginate. The he was previously cared for in the [facility] wound care clinic when it was open. His wounds are better with compression this week and there is certainly less swelling. 2/21; bilateral superficial venous insufficiency wounds. A cluster on the left lateral. He has very well-defined areas on the right anterior right and right lateral. We are using silver alginate and 4-layer compression 2/28; bilateral superficial venous insufficiency wounds in the setting of significant stage III lymphedema. He has a cluster on the left which is smaller. His area on the right anterior and right lateral also look better. 3/14 superficial bilateral venous insufficiency wounds. Much better on both sides in fact the area on the right posterior is healed. He still has 2 small open areas on the right anterior and the left lateral 3/21; superficial bilateral venous insufficiency wounds. Everything is closed on the right side. Small area still remains on the left. We  will transition him today to his right extremiteaze stocking. Still dressing the remaining wound on the left with compression 3/28; the patient's right leg remains closed in his wraparound stocking. Disappointingly today the area on the left lateral leg looks about the same. I thought this might be closed with been  using silver alginate 4/4; right leg remains in his wraparound stocking.. Left lateral leg measures smaller even under illumination the wound looks good. Changed him to [facility] with additional ABD compression last week Electronic Signature(s) Signed: 07/15/2020 4:59:32 PM By: Linton Ham MD Entered By: Linton Ham on 07/15/2020 08:26:37 -------------------------------------------------------------------------------- Physical Exam Details Patient Name: Date of Service: Marvin Atlas, [place] UL J. 07/15/2020 8:00 A M Medical Record Number: [id number] Patient Account Number: [id number] Date of Birth/Sex: Treating RN: Feb 19, 1961 (60 y.o. [name] Primary Care Provider: [name] Other Clinician: Referring Provider: Treating Provider/Extender: Talitha Givens HN Weeks in Treatment: 8 Constitutional Sitting or standing Blood Pressure is within target range for patient.. Pulse regular and within target range for patient.Marland Kitchen Respirations regular, non-labored and within target range.. Temperature is normal and within the target range for the patient.Marland Kitchen Appears in no distress. Notes Wound exam; left lateral leg clean small wound dimensions down. Even under illumination no obvious surface debris that should inhibit final closure. He has severe venous hypertension with hemosiderin deposition dilated venules and veins Electronic Signature(s) Signed: 07/15/2020 4:59:32 PM By: Linton Ham MD Entered By: Linton Ham on 07/15/2020 08:27:21 -------------------------------------------------------------------------------- Physician Orders Details Patient Name: Date of Service: Marvin Atlas, [place] UL J. 07/15/2020 8:00 A M Medical Record Number: [id number] Patient Account Number: [id number] Date of Birth/Sex: Treating RN: November 02, 1960 (61 y.o. [name] Primary Care Provider: [name] Other Clinician: Referring Provider: Treating Provider/Extender: Talitha Givens HN Weeks in Treatment: 8 Verbal / Phone Orders: No Diagnosis Coding ICD-10 Coding Code Description [phone] Chronic venous hypertension (idiopathic) with ulcer and inflammation of bilateral lower extremity I89.0 Lymphedema, not elsewhere classified L97.811 Non-pressure chronic ulcer of other part of right lower leg limited to breakdown of skin L97.821 Non-pressure chronic ulcer of other part of left lower leg limited to breakdown of skin Follow-up Appointments Return Appointment in 1 week. Bathing/ Shower/ Hygiene May shower with protection but do not get wound dressing(s) wet. Edema Control - Lymphedema / SCD / Other Bilateral Lower Extremities Elevate legs to the level of the heart or above for 30 minutes daily and/or when sitting, a frequency of: - throughout the day Avoid standing for long periods of time. Exercise regularly Moisturize legs daily. Compression stocking or Garment 30-40 mm/Hg pressure to: - Extremit-ease to right leg daily. Apply first thing in the morning, remove at night before bed. Wound Treatment Wound #3 - Lower Leg Wound Laterality: Left, Posterior Cleanser: Soap and Water 1 x Per Week Discharge Instructions: May shower and wash wound with dial antibacterial soap and water prior to dressing change. Peri-Wound Care: Triamcinolone 15 (g) 1 x Per Week Discharge Instructions: Use triamcinolone 15 (g) as directed Peri-Wound Care: Sween Lotion (Moisturizing lotion) 1 x Per Week Discharge Instructions: Apply moisturizing lotion as directed Prim Dressing: Hydrofera Blue Ready Foam, 2.5 x2.5 in 1 x Per Week ary Discharge Instructions: Apply to wound bed as instructed Secondary Dressing: Woven Gauze Sponge, Non-Sterile 4x4 in 1 x Per Week Discharge Instructions: Apply over primary dressing as directed. Secondary Dressing: ABD Pad, 8x10 1 x Per Week Discharge Instructions: Fold ABD pad in half  to apply more pressure to wound area Compression  Wrap: FourPress (4 layer compression wrap) 1 x Per Week Discharge Instructions: Apply four layer compression as directed. Compression Wrap: Unna boot under wrap to help secure in place 1 x Per Week Electronic Signature(s) Signed: 07/15/2020 4:59:32 PM By: Linton Ham MD Signed: 07/15/2020 5:00:21 PM By: Levan Hurst RN, BSN Entered By: Levan Hurst on 07/15/2020 08:21:26 -------------------------------------------------------------------------------- Problem List Details Patient Name: Date of Service: Marvin Atlas, [place] UL J. 07/15/2020 8:00 A M Medical Record Number: [id number] Patient Account Number: [id number] Date of Birth/Sex: Treating RN: 05/13/1960 (60 y.o. Jonette Eva, [place] Primary Care Provider: [name] Other Clinician: Referring Provider: Treating Provider/Extender: Talitha Givens HN Weeks in Treatment: 8 Active Problems ICD-10 Encounter Code Description Active Date MDM Diagnosis I87.333 Chronic venous hypertension (idiopathic) with ulcer and inflammation of 05/20/2020 No Yes bilateral lower extremity I89.0 Lymphedema, not elsewhere classified 05/20/2020 No Yes L97.821 Non-pressure chronic ulcer of other part of left lower leg limited to breakdown 05/20/2020 No Yes of skin Inactive Problems ICD-10 Code Description Active Date Inactive Date L97.811 Non-pressure chronic ulcer of other part of right lower leg limited to breakdown of skin 05/20/2020 05/20/2020 Resolved Problems Electronic Signature(s) Signed: 07/15/2020 4:59:32 PM By: Linton Ham MD Entered By: Linton Ham on 07/15/2020 08:25:56 -------------------------------------------------------------------------------- Progress Note Details Patient Name: Date of Service: Marvin Atlas, PA UL J. 07/15/2020 8:00 A M Medical Record Number: [id number] Patient Account Number: [id number] Date of Birth/Sex: Treating RN: 07/10/1960 (60 y.o. [name] Primary Care Provider: [name] Other  Clinician: Referring Provider: Treating Provider/Extender: Talitha Givens HN Weeks in Treatment: 8 Subjective History of Present Illness (HPI) ADMISSION 05/20/2020 This is a 60 year old man who is not a diabetic however he has a long history of chronic venous insufficiency with wounds on his lower extremities. He went to the wound care center in [place] for a long period of time with recurrent wounds in these areas treated with compression wraps. He comes in today with wounds on his bilateral lower legs that he says have been there about a year on the right and about 6 months on the left. He went to see Dr. Oneida Alar on 01/17/2020 who noted that he had no history of DVT but does have a history of venous insufficiency wounds. They noted that he was considering laser ablation in October 2020 but he canceled the procedure. He had an episode of bleeding from varicose veins in 2019. He had repeated venous reflux study on 10/6 which showed no evidence of a DVT or SVT Noted that he had venous reflux in the right greater saphenous vein in the thigh and the calf on the right. Also . noted to have venous reflux in the right femoral vein right popliteal vein and right small saphenous vein. At the time in October they wanted the wounds to heal a bit he also also had a right groin wound which is apparently closed. Not sure about the follow-up with vein and vascular. He comes in today with some light compression wraps and collagen on the wound placed by Dr. Lilia Pro who is a Education officer, environmental in [place]. He has 2 areas on the left leg 1 anteriorly and a large area posteriorly and a smaller area on the right anterior lower leg. He has marked lower extremity edema which is nonpitting. Hemosiderin deposition on both lower legs extensively. There is dilated venules in his feet. Past medical history includes history  of opioid abuse in remission, left nephrolithiasis, history of an ablation although I did  not see this in Dr. Oneida Alar notes, congestive heart failure and anemia ABIs in our clinic were noncompressible bilaterally 05/27/2020; we admitted this patient to clinic last week. He has bilateral superficial venous insufficiency with bilateral wounds left greater than right. We put him in 4 layer compression with silver alginate. The he was previously cared for in the [facility] wound care clinic when it was open. His wounds are better with compression this week and there is certainly less swelling. 2/21; bilateral superficial venous insufficiency wounds. A cluster on the left lateral. He has very well-defined areas on the right anterior right and right lateral. We are using silver alginate and 4-layer compression 2/28; bilateral superficial venous insufficiency wounds in the setting of significant stage III lymphedema. He has a cluster on the left which is smaller. His area on the right anterior and right lateral also look better. 3/14 superficial bilateral venous insufficiency wounds. Much better on both sides in fact the area on the right posterior is healed. He still has 2 small open areas on the right anterior and the left lateral 3/21; superficial bilateral venous insufficiency wounds. Everything is closed on the right side. Small area still remains on the left. We will transition him today to his right extremiteaze stocking. Still dressing the remaining wound on the left with compression 3/28; the patient's right leg remains closed in his wraparound stocking. Disappointingly today the area on the left lateral leg looks about the same. I thought this might be closed with been using silver alginate 4/4; right leg remains in his wraparound stocking.. Left lateral leg measures smaller even under illumination the wound looks good. Changed him to [facility] with additional ABD compression last week Objective Constitutional Sitting or standing Blood Pressure is within target range for patient..  Pulse regular and within target range for patient.Marland Kitchen Respirations regular, non-labored and within target range.. Temperature is normal and within the target range for the patient.Marland Kitchen Appears in no distress. Vitals Time Taken: 8:07 AM, Height: 71 in, Weight: 210 lbs, BMI: 29.3, Temperature: 98.4 F, Pulse: 61 bpm, Respiratory Rate: 18 breaths/min, Blood Pressure: 113/50 mmHg. General Notes: Wound exam; left lateral leg clean small wound dimensions down. Even under illumination no obvious surface debris that should inhibit final closure. He has severe venous hypertension with hemosiderin deposition dilated venules and veins Integumentary (Hair, Skin) Wound #3 status is Open. Original cause of wound was Gradually Appeared. The date acquired was: 05/15/2019. The wound has been in treatment 8 weeks. The wound is located on the Left,Posterior Lower Leg. The wound measures 0.6cm length x 0.6cm width x 0.1cm depth; 0.283cm^2 area and 0.028cm^3 volume. There is Fat Layer (Subcutaneous Tissue) exposed. There is no tunneling or undermining noted. There is a small amount of serosanguineous drainage noted. The wound margin is flat and intact. There is large (67-100%) red granulation within the wound bed. There is no necrotic tissue within the wound bed. Assessment Active Problems ICD-10 Chronic venous hypertension (idiopathic) with ulcer and inflammation of bilateral lower extremity Lymphedema, not elsewhere classified Non-pressure chronic ulcer of other part of left lower leg limited to breakdown of skin Procedures Wound #3 Pre-procedure diagnosis of Wound #3 is a Venous Leg Ulcer located on the Left,Posterior Lower Leg . There was a Four Layer Compression Therapy Procedure by Levan Hurst, RN. Post procedure Diagnosis Wound #3: Same as Pre-Procedure Plan Follow-up Appointments: Return Appointment in 1 week. Bathing/  Shower/ Hygiene: May shower with protection but do not get wound dressing(s) wet. Edema  Control - Lymphedema / SCD / Other: Elevate legs to the level of the heart or above for 30 minutes daily and/or when sitting, a frequency of: - throughout the day Avoid standing for long periods of time. Exercise regularly Moisturize legs daily. Compression stocking or Garment 30-40 mm/Hg pressure to: - Extremit-ease to right leg daily. Apply first thing in the morning, remove at night before bed. WOUND #3: - Lower Leg Wound Laterality: Left, Posterior Cleanser: Soap and Water 1 x Per Week/ Discharge Instructions: May shower and wash wound with dial antibacterial soap and water prior to dressing change. Peri-Wound Care: Triamcinolone 15 (g) 1 x Per Week/ Discharge Instructions: Use triamcinolone 15 (g) as directed Peri-Wound Care: Sween Lotion (Moisturizing lotion) 1 x Per Week/ Discharge Instructions: Apply moisturizing lotion as directed Prim Dressing: Hydrofera Blue Ready Foam, 2.5 x2.5 in 1 x Per Week/ ary Discharge Instructions: Apply to wound bed as instructed Secondary Dressing: Woven Gauze Sponge, Non-Sterile 4x4 in 1 x Per Week/ Discharge Instructions: Apply over primary dressing as directed. Secondary Dressing: ABD Pad, 8x10 1 x Per Week/ Discharge Instructions: Fold ABD pad in half to apply more pressure to wound area Com pression Wrap: FourPress (4 layer compression wrap) 1 x Per Week/ Discharge Instructions: Apply four layer compression as directed. Com pression Wrap: Unna boot under wrap to help secure in place 1 x Per Week/ 1. Continue with Hydrofera Blue ABDs under 4-layer compression 2. He has received this compression pump approval but is not yet actually received the actual apparatus. I think this will be helpful to him going forward Electronic Signature(s) Signed: 07/15/2020 4:59:32 PM By: Linton Ham MD Entered By: Linton Ham on 07/15/2020 08:28:12 -------------------------------------------------------------------------------- SuperBill Details Patient Name:  Date of Service: Marvin Atlas, [place] UL J. 07/15/2020 Medical Record Number: [id number] Patient Account Number: [id number] Date of Birth/Sex: Treating RN: 1961/02/04 (60 y.o. 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

## 2020-07-16 NOTE — Progress Notes (Signed)
LARREN, COPES ([id number]) Visit Report for 07/15/2020 Arrival Information Details Patient Name: Date of Service: [name], [place] J. 07/15/2020 8:00 A M Medical Record Number: [id number] Patient Account Number: [id number] Date of Birth/Sex: Treating RN: 1960/11/01 (60 y.o. [name] Primary Care Burrel Legrand: [name] Other Clinician: Referring Amandeep Hogston: Treating Imri Lor/Extender: Talitha Givens HN Weeks in Treatment: 8 Visit Information History Since Last Visit Added or deleted any medications: No Patient Arrived: Ambulatory Any new allergies or adverse reactions: No Arrival Time: 08:05 Had a fall or experienced change in No Accompanied By: self activities of daily living that may affect Transfer Assistance: None risk of falls: Patient Identification Verified: Yes Signs or symptoms of abuse/neglect since last visito No Secondary Verification Process Completed: Yes Hospitalized since last visit: No Patient Requires Transmission-Based Precautions: No Implantable device outside of the clinic excluding No Patient Has Alerts: Yes cellular tissue based products placed in the center Patient Alerts: ABI's R: N/C since last visit: Has Dressing in Place as Prescribed: Yes Pain Present Now: No Electronic Signature(s) Signed: 07/15/2020 10:23:55 AM By: Sandre Kitty Entered By: Sandre Kitty on 07/15/2020 08:06:21 -------------------------------------------------------------------------------- Compression Therapy Details Patient Name: Date of Service: Marvin Atlas, [place] J. 07/15/2020 8:00 A M Medical Record Number: [id number] Patient Account Number: [id number] Date of Birth/Sex: Treating RN: February 07, 1961 (60 y.o. [name] Primary Care Mazelle Huebert: [name] Other Clinician: Referring Amamda Curbow: Treating Deunta Beneke/Extender: Talitha Givens HN Weeks in Treatment: 8 Compression Therapy Performed for Wound Assessment: Wound #3  Left,Posterior Lower Leg Performed By: Clinician Levan Hurst, RN Compression Type: Four Layer Post Procedure Diagnosis Same as Pre-procedure Electronic Signature(s) Signed: 07/15/2020 5:00:21 PM By: Levan Hurst RN, BSN Entered By: Levan Hurst on 07/15/2020 08:23:49 -------------------------------------------------------------------------------- Encounter Discharge Information Details Patient Name: Date of Service: Marvin Atlas, Marvin UL J. 07/15/2020 8:00 A M Medical Record Number: [id number] Patient Account Number: [id number] Date of Birth/Sex: Treating RN: 12-Dec-1960 (60 y.o. [name] Primary Care Tiasha Helvie: [name] Other Clinician: Referring Shylo Zamor: Treating Santiago Stenzel/Extender: Talitha Givens HN Weeks in Treatment: 8 Encounter Discharge Information Items Discharge Condition: Stable Ambulatory Status: Ambulatory Discharge Destination: Home Transportation: Private Auto Accompanied By: alone Schedule Follow-up Appointment: Yes Clinical Summary of Care: Patient Declined Electronic Signature(s) Signed: 07/15/2020 5:00:21 PM By: Levan Hurst RN, BSN Entered By: Levan Hurst on 07/15/2020 09:18:55 -------------------------------------------------------------------------------- Lower Extremity Assessment Details Patient Name: Date of Service: Marvin Atlas, Marvin UL J. 07/15/2020 8:00 A M Medical Record Number: [id number] Patient Account Number: [id number] Date of Birth/Sex: Treating RN: 08-27-1960 (60 y.o. [name] Primary Care Kurtiss Wence: [name] Other Clinician: Referring Melenda Bielak: Treating Zakyia Gagan/Extender: Talitha Givens HN Weeks in Treatment: 8 Edema Assessment Assessed: [Left: No] [Right: No] Edema: [Left: Yes] [Right: Yes] Calf Left: Right: Point of Measurement: From Medial Instep 32.5 cm 34.4 cm Ankle Left: Right: Point of Measurement: From Medial Instep 23.5 cm 23.5 cm Vascular  Assessment Pulses: Dorsalis Pedis Palpable: [Left:Yes] Electronic Signature(s) Signed: 07/15/2020 5:00:21 PM By: Levan Hurst RN, BSN Entered By: Levan Hurst on 07/15/2020 08:20:48 -------------------------------------------------------------------------------- Multi Wound Chart Details Patient Name: Date of Service: Marvin Atlas, Marvin UL J. 07/15/2020 8:00 A M Medical Record Number: [id number] Patient Account Number: [id number] Date of Birth/Sex: Treating RN: Oct 24, 1960 (60 y.o. [name] Primary Care Janiqua Friscia: [name] Other Clinician: Referring Saadiya Wilfong: Treating Rekisha Welling/Extender: Lyda Perone, JO HN Weeks in Treatment: 8 Vital  Signs Height(in): 71 Pulse(bpm): 61 Weight(lbs): 210 Blood Pressure(mmHg): 113/50 Body Mass Index(BMI): 29 Temperature(F): 98.4 Respiratory Rate(breaths/min): 18 Photos: [3:No Photos Left, Posterior Lower Leg] [N/A:N/A N/A] Wound Location: [3:Gradually Appeared] [N/A:N/A] Wounding Event: [3:Venous Leg Ulcer] [N/A:N/A] Primary Etiology: [3:Anemia, Sleep Apnea, Congestive] [N/A:N/A] Comorbid History: [3:Heart Failure, Coronary Artery Disease, Osteoarthritis 05/15/2019] [N/A:N/A] Date Acquired: [3:8] [N/A:N/A] Weeks of Treatment: [3:Open] [N/A:N/A] Wound Status: [3:0.6x0.6x0.1] [N/A:N/A] Measurements L x W x D (cm) [3:0.283] [N/A:N/A] A (cm) : rea [3:0.028] [N/A:N/A] Volume (cm) : [3:99.50%] [N/A:N/A] % Reduction in Area: [3:99.50%] [N/A:N/A] % Reduction in Volume: [3:Full Thickness Without Exposed] [N/A:N/A] Classification: [3:Support Structures Small] [N/A:N/A] Exudate Amount: [3:Serosanguineous] [N/A:N/A] Exudate Type: [3:red, brown] [N/A:N/A] Exudate Color: [3:Flat and Intact] [N/A:N/A] Wound Margin: [3:Large (67-100%)] [N/A:N/A] Granulation Amount: [3:Red] [N/A:N/A] Granulation Quality: [3:None Present (0%)] [N/A:N/A] Necrotic Amount: [3:Fat Layer (Subcutaneous Tissue): Yes N/A] Exposed Structures: [3:Fascia:  No Tendon: No Muscle: No Joint: No Bone: No Medium (34-66%)] [N/A:N/A] Epithelialization: [3:Compression Therapy] [N/A:N/A] Treatment Notes Electronic Signature(s) Signed: 07/15/2020 4:59:32 PM By: Linton Ham MD Signed: 07/15/2020 5:00:21 PM By: Levan Hurst RN, BSN Entered By: Linton Ham on 07/15/2020 08:26:02 -------------------------------------------------------------------------------- Multi-Disciplinary Care Plan Details Patient Name: Date of Service: Marvin Atlas, [place] J. 07/15/2020 8:00 A M Medical Record Number: [id number] Patient Account Number: [id number] Date of Birth/Sex: Treating RN: 06-16-1960 (60 y.o. [name] Primary Care Deverick Pruss: [name] Other Clinician: Referring Elycia Woodside: Treating Valery Chance/Extender: Talitha Givens HN Weeks in Treatment: 8 Multidisciplinary Care Plan reviewed with physician Active Inactive Venous Leg Ulcer Nursing Diagnoses: Actual venous Insuffiency (use after diagnosis is confirmed) Knowledge deficit related to disease process and management Goals: Patient will maintain optimal edema control Date Initiated: 05/20/2020 Target Resolution Date: 08/05/2020 Goal Status: Active Patient/caregiver will verbalize understanding of disease process and disease management Date Initiated: 05/20/2020 Target Resolution Date: 08/05/2020 Goal Status: Active Interventions: Assess peripheral edema status every visit. Compression as ordered Provide education on venous insufficiency Notes: 06/24/20: Edema control and wound management ongoing. Wound/Skin Impairment Nursing Diagnoses: Impaired tissue integrity Knowledge deficit related to smoking impact on wound healing Knowledge deficit related to ulceration/compromised skin integrity Goals: Patient will demonstrate a reduced rate of smoking or cessation of smoking Date Initiated: 05/20/2020 Target Resolution Date: 08/05/2020 Goal Status: Active Patient/caregiver will  verbalize understanding of skin care regimen Date Initiated: 05/20/2020 Target Resolution Date: 08/05/2020 Goal Status: Active Ulcer/skin breakdown will have a volume reduction of 30% by week 4 Date Initiated: 05/20/2020 Date Inactivated: 06/24/2020 Target Resolution Date: 06/21/2020 Goal Status: Met Ulcer/skin breakdown will have a volume reduction of 50% by week 8 Date Initiated: 06/24/2020 Target Resolution Date: 07/22/2020 Goal Status: Active Interventions: Assess patient/caregiver ability to obtain necessary supplies Assess patient/caregiver ability to perform ulcer/skin care regimen upon admission and as needed Assess ulceration(s) every visit Provide education on smoking Provide education on ulcer and skin care Notes: Electronic Signature(s) Signed: 07/15/2020 5:00:21 PM By: Levan Hurst RN, BSN Entered By: Levan Hurst on 07/15/2020 08:21:40 -------------------------------------------------------------------------------- Pain Assessment Details Patient Name: Date of Service: Marvin Atlas, Marvin [place] J. 07/15/2020 8:00 A M Medical Record Number: [id number] Patient Account Number: [id number] Date of Birth/Sex: Treating RN: 11-01-1960 (60 y.o. [name] Primary Care Addie Alonge: [name] Other Clinician: Referring Treavor Blomquist: Treating Davanna He/Extender: Talitha Givens HN Weeks in Treatment: 8 Active Problems Location of Pain Severity and Description of Pain Patient Has Paino No Site Locations Pain Management and Medication Current Pain Management: Electronic Signature(s) Signed: 07/15/2020 10:23:55  AM By: Sandre Kitty Signed: 07/15/2020 5:00:21 PM By: Levan Hurst RN, BSN Entered By: Sandre Kitty on 07/15/2020 08:07:46 -------------------------------------------------------------------------------- Patient/Caregiver Education Details Patient Name: Date of Service: Marvin Atlas, [place] J. 4/4/2022andnbsp8:00 A M Medical Record Number: [id number] Patient  Account Number: [id number] Date of Birth/Gender: Treating RN: [birth year] (60 y.o. [name] Primary Care Physician: [name] Other Clinician: Referring Physician: Treating Physician/Extender: Randa Spike Weeks in Treatment: 8 Education Assessment Education Provided To: Patient Education Topics Provided Wound/Skin Impairment: Methods: Explain/Verbal Responses: State content correctly Electronic Signature(s) Signed: 07/15/2020 5:00:21 PM By: Levan Hurst RN, BSN Entered By: Levan Hurst on 07/15/2020 08:22:29 -------------------------------------------------------------------------------- Wound Assessment Details Patient Name: Date of Service: Marvin Atlas, Marvin UL J. 07/15/2020 8:00 A M Medical Record Number: [id number] Patient Account Number: [id number] Date of Birth/Sex: Treating RN: 08/13/1960 (60 y.o. [name] Primary Care Kylle Lall: [name] Other Clinician: Referring Kael Keetch: Treating Wayne Brunker/Extender: Talitha Givens HN Weeks in Treatment: 8 Wound Status Wound Number: 3 Primary Venous Leg Ulcer Etiology: Wound Location: Left, Posterior Lower Leg Wound Open Wounding Event: Gradually Appeared Status: Date Acquired: 05/15/2019 Comorbid Anemia, Sleep Apnea, Congestive Heart Failure, Coronary Weeks Of Treatment: 8 History: Artery Disease, Osteoarthritis Clustered Wound: No Photos Wound Measurements Length: (cm) 0.6 Width: (cm) 0.6 Depth: (cm) 0.1 Area: (cm) 0.283 Volume: (cm) 0.028 % Reduction in Area: 99.5% % Reduction in Volume: 99.5% Epithelialization: Medium (34-66%) Tunneling: No Undermining: No Wound Description Classification: Full Thickness Without Exposed Support Structures Wound Margin: Flat and Intact Exudate Amount: Small Exudate Type: Serosanguineous Exudate Color: red, brown Foul Odor After Cleansing: No Slough/Fibrino Yes Wound Bed Granulation Amount: Large (67-100%)  Exposed Structure Granulation Quality: Red Fascia Exposed: No Necrotic Amount: None Present (0%) Fat Layer (Subcutaneous Tissue) Exposed: Yes Tendon Exposed: No Muscle Exposed: No Joint Exposed: No Bone Exposed: No Treatment Notes Wound #3 (Lower Leg) Wound Laterality: Left, Posterior Cleanser Soap and Water Discharge Instruction: May shower and wash wound with dial antibacterial soap and water prior to dressing change. Peri-Wound Care Triamcinolone 15 (g) Discharge Instruction: Use triamcinolone 15 (g) as directed Sween Lotion (Moisturizing lotion) Discharge Instruction: Apply moisturizing lotion as directed Topical Primary Dressing Hydrofera Blue Ready Foam, 2.5 x2.5 in Discharge Instruction: Apply to wound bed as instructed Secondary Dressing Woven Gauze Sponge, Non-Sterile 4x4 in Discharge Instruction: Apply over primary dressing as directed. ABD Pad, 8x10 Discharge Instruction: Fold ABD pad in half to apply more pressure to wound area Secured With Compression Wrap FourPress (4 layer compression wrap) Discharge Instruction: Apply four layer compression as directed. Unna boot under wrap to help secure in place Compression Stockings Add-Ons Electronic Signature(s) Signed: 07/15/2020 5:00:21 PM By: Levan Hurst RN, BSN Signed: 07/16/2020 2:26:07 PM By: Sandre Kitty Entered By: Sandre Kitty on 07/15/2020 16:45:42 -------------------------------------------------------------------------------- Vitals Details Patient Name: Date of Service: Marvin Atlas, Marvin UL J. 07/15/2020 8:00 A M Medical Record Number: [id number] Patient Account Number: [id number] Date of Birth/Sex: Treating RN: 1961-02-26 (60 y.o. [name] Primary Care Estuardo Frisbee: [name] Other Clinician: Referring Carlis Blanchard: Treating Monna Crean/Extender: Talitha Givens HN Weeks in Treatment: 8 Vital Signs Time Taken: 08:07 Temperature (F): 98.4 Height (in): 71 Pulse (bpm):  61 Weight (lbs): 210 Respiratory Rate (breaths/min): 18 Body Mass Index (BMI): 29.3 Blood Pressure (mmHg): 113/50 Reference Range: 80 - 120 mg / dl Electronic Signature(s) Signed: 07/15/2020 10:23:55 AM By: Sandre Kitty Entered By: Sandre Kitty on 07/15/2020 08:07:35

## 2020-07-22 ENCOUNTER — Encounter (HOSPITAL_BASED_OUTPATIENT_CLINIC_OR_DEPARTMENT_OTHER): Payer: PPO | Admitting: Internal Medicine

## 2020-07-22 ENCOUNTER — Other Ambulatory Visit: Payer: Self-pay

## 2020-07-22 DIAGNOSIS — L97222 Non-pressure chronic ulcer of left calf with fat layer exposed: Secondary | ICD-10-CM | POA: Diagnosis not present

## 2020-07-22 DIAGNOSIS — I87333 Chronic venous hypertension (idiopathic) with ulcer and inflammation of bilateral lower extremity: Secondary | ICD-10-CM | POA: Diagnosis not present

## 2020-07-22 DIAGNOSIS — I87313 Chronic venous hypertension (idiopathic) with ulcer of bilateral lower extremity: Secondary | ICD-10-CM | POA: Diagnosis not present

## 2020-07-22 DIAGNOSIS — F1111 Opioid abuse, in remission: Secondary | ICD-10-CM | POA: Diagnosis not present

## 2020-07-24 NOTE — Progress Notes (Signed)
[name], [name] ([id number]) Visit Report for 07/22/2020 Arrival Information Details Patient Name: Date of Service: [name], [place] J. 07/22/2020 7:30 A M Medical Record Number: [id number] Patient Account Number: [id number] Date of Birth/Sex: Treating RN: 01/01/1961 (60 y.o. Marcheta Grammes Primary Care Lizandro Spellman: [name] Other Clinician: Referring Mack Thurmon: Treating Yordan Martindale/Extender: Colin Mulders HN Weeks in Treatment: 9 Visit Information History Since Last Visit Added or deleted any medications: No Patient Arrived: Ambulatory Any new allergies or adverse reactions: No Arrival Time: 07:42 Had a fall or experienced change in No Transfer Assistance: None activities of daily living that may affect Patient Identification Verified: Yes risk of falls: Secondary Verification Process Completed: Yes Signs or symptoms of abuse/neglect since last visito No Patient Requires Transmission-Based Precautions: No Hospitalized since last visit: No Patient Has Alerts: Yes Implantable device outside of the clinic excluding No Patient Alerts: ABI's R: N/C cellular tissue based products placed in the center since last visit: Has Dressing in Place as Prescribed: Yes Has Compression in Place as Prescribed: Yes Pain Present Now: No Electronic Signature(s) Signed: 07/22/2020 5:21:09 PM By: Lorrin Jackson Entered By: Lorrin Jackson on 07/22/2020 07:44:37 -------------------------------------------------------------------------------- Compression Therapy Details Patient Name: Date of Service: Marvin Atlas, [place] J. 07/22/2020 7:30 A M Medical Record Number: [id number] Patient Account Number: [id number] Date of Birth/Sex: Treating RN: 01-Nov-1960 (60 y.o. [name] Primary Care Irma Roulhac: [name] Other Clinician: Referring York Valliant: Treating Wells Mabe/Extender: Talitha Givens HN Weeks in Treatment: 9 Compression Therapy Performed for Wound  Assessment: Wound #3 Left,Posterior Lower Leg Performed By: Clinician Levan Hurst, RN Compression Type: Four Layer Post Procedure Diagnosis Same as Pre-procedure Electronic Signature(s) Signed: 07/22/2020 5:30:45 PM By: Levan Hurst RN, BSN Entered By: Levan Hurst on 07/22/2020 08:18:53 -------------------------------------------------------------------------------- Lower Extremity Assessment Details Patient Name: Date of Service: Marvin Atlas, PA [place] J. 07/22/2020 7:30 A M Medical Record Number: [id number] Patient Account Number: [id number] Date of Birth/Sex: Treating RN: 10-Jul-1960 (60 y.o. Marcheta Grammes Primary Care Rania Prothero: [name] Other Clinician: Referring Dura Mccormack: Treating Cole Eastridge/Extender: Allayne Butcher, JO HN Weeks in Treatment: 9 Edema Assessment Assessed: [Left: Yes] [Right: No] Edema: [Left: Ye] [Right: s] Calf Left: Right: Point of Measurement: 30 cm From Medial Instep 33.5 cm Ankle Left: Right: Point of Measurement: 10 cm From Medial Instep 23.5 cm Vascular Assessment Pulses: Dorsalis Pedis Palpable: [Left:Yes] Electronic Signature(s) Signed: 07/22/2020 5:21:09 PM By: Lorrin Jackson Entered By: Lorrin Jackson on 07/22/2020 07:54:25 -------------------------------------------------------------------------------- Multi Wound Chart Details Patient Name: Date of Service: Marvin Atlas, PA [place] J. 07/22/2020 7:30 A M Medical Record Number: [id number] Patient Account Number: [id number] Date of Birth/Sex: Treating RN: 1961-03-28 (60 y.o. [name] Primary Care Paiden Caraveo: [name] Other Clinician: Referring Rayna Brenner: Treating Ashiah Karpowicz/Extender: Talitha Givens HN Weeks in Treatment: 9 Vital Signs Height(in): 71 Pulse(bpm): 80 Weight(lbs): 210 Blood Pressure(mmHg): 119/65 Body Mass Index(BMI): 29 Temperature(F): 98.3 Respiratory Rate(breaths/min): 18 Photos: [3:No Photos Left, Posterior Lower Leg]  [N/A:N/A N/A] Wound Location: [3:Gradually Appeared] [N/A:N/A] Wounding Event: [3:Venous Leg Ulcer] [N/A:N/A] Primary Etiology: [3:Anemia, Sleep Apnea, Congestive] [N/A:N/A] Comorbid History: [3:Heart Failure, Coronary Artery Disease, Osteoarthritis 05/15/2019] [N/A:N/A] Date Acquired: [3:9] [N/A:N/A] Weeks of Treatment: [3:Open] [N/A:N/A] Wound Status: [3:0.4x0.5x0.1] [N/A:N/A] Measurements L x W x D (cm) [3:0.157] [N/A:N/A] A (cm) : rea [3:0.016] [N/A:N/A] Volume (cm) : [3:99.70%] [N/A:N/A] % Reduction in Area: [3:99.70%] [N/A:N/A] % Reduction in Volume: [3:Full Thickness Without Exposed] [N/A:N/A] Classification: [3:Support  Structures Small] [N/A:N/A] Exudate Amount: [3:Serosanguineous] [N/A:N/A] Exudate Type: [3:red, brown] [N/A:N/A] Exudate Color: [3:Distinct, outline attached] [N/A:N/A] Wound Margin: [3:Large (67-100%)] [N/A:N/A] Granulation Amount: [3:Red] [N/A:N/A] Granulation Quality: [3:None Present (0%)] [N/A:N/A] Necrotic Amount: [3:Fat Layer (Subcutaneous Tissue): Yes N/A] Exposed Structures: [3:Fascia: No Tendon: No Muscle: No Joint: No Bone: No Medium (34-66%)] [N/A:N/A] Epithelialization: [3:Compression Therapy] [N/A:N/A] Treatment Notes Electronic Signature(s) Signed: 07/22/2020 4:45:43 PM By: Linton Ham MD Signed: 07/22/2020 5:30:45 PM By: Levan Hurst RN, BSN Entered By: Linton Ham on 07/22/2020 08:21:08 -------------------------------------------------------------------------------- Multi-Disciplinary Care Plan Details Patient Name: Date of Service: Marvin Atlas, PA UL J. 07/22/2020 7:30 A M Medical Record Number: [id number] Patient Account Number: [id number] Date of Birth/Sex: Treating RN: 05-04-1960 (85 y.o. [name] Primary Care Jacquelynn Friend: [name] Other Clinician: Referring Philamena Kramar: Treating Medea Deines/Extender: Colin Mulders HN Weeks in Treatment: 9 Multidisciplinary Care Plan reviewed with physician Active  Inactive Venous Leg Ulcer Nursing Diagnoses: Actual venous Insuffiency (use after diagnosis is confirmed) Knowledge deficit related to disease process and management Goals: Patient will maintain optimal edema control Date Initiated: 05/20/2020 Target Resolution Date: 08/05/2020 Goal Status: Active Patient/caregiver will verbalize understanding of disease process and disease management Date Initiated: 05/20/2020 Target Resolution Date: 08/05/2020 Goal Status: Active Interventions: Assess peripheral edema status every visit. Compression as ordered Provide education on venous insufficiency Notes: 06/24/20: Edema control and wound management ongoing. Wound/Skin Impairment Nursing Diagnoses: Impaired tissue integrity Knowledge deficit related to smoking impact on wound healing Knowledge deficit related to ulceration/compromised skin integrity Goals: Patient will demonstrate a reduced rate of smoking or cessation of smoking Date Initiated: 05/20/2020 Target Resolution Date: 08/05/2020 Goal Status: Active Patient/caregiver will verbalize understanding of skin care regimen Date Initiated: 05/20/2020 Target Resolution Date: 08/05/2020 Goal Status: Active Ulcer/skin breakdown will have a volume reduction of 30% by week 4 Date Initiated: 05/20/2020 Date Inactivated: 06/24/2020 Target Resolution Date: 06/21/2020 Goal Status: Met Ulcer/skin breakdown will have a volume reduction of 50% by week 8 Date Initiated: 06/24/2020 Target Resolution Date: 07/22/2020 Goal Status: Active Interventions: Assess patient/caregiver ability to obtain necessary supplies Assess patient/caregiver ability to perform ulcer/skin care regimen upon admission and as needed Assess ulceration(s) every visit Provide education on smoking Provide education on ulcer and skin care Notes: Electronic Signature(s) Signed: 07/22/2020 5:30:45 PM By: Levan Hurst RN, BSN Entered By: Levan Hurst on 07/22/2020  07:50:45 -------------------------------------------------------------------------------- Pain Assessment Details Patient Name: Date of Service: Marvin Atlas, PA [place] J. 07/22/2020 7:30 A M Medical Record Number: [id number] Patient Account Number: [id number] Date of Birth/Sex: Treating RN: [birth year] (60 y.o. Marcheta Grammes Primary Care Kiran Carline: [name] Other Clinician: Referring Khalessi Blough: Treating Zeinab Rodwell/Extender: Allayne Butcher, JO HN Weeks in Treatment: 9 Active Problems Location of Pain Severity and Description of Pain Patient Has Paino No Site Locations Pain Management and Medication Current Pain Management: Electronic Signature(s) Signed: 07/22/2020 5:21:09 PM By: Lorrin Jackson Entered By: Lorrin Jackson on 07/22/2020 07:48:20 -------------------------------------------------------------------------------- Patient/Caregiver Education Details Patient Name: Date of Service: Marvin Atlas, PA UL J. 4/11/2022andnbsp7:30 A M Medical Record Number: [id number] Patient Account Number: [id number] Date of Birth/Gender: Treating RN: 04/07/1961 (60 y.o. [name] Primary Care Physician: [name] Other Clinician: Referring Physician: Treating Physician/Extender: Jessica Priest Weeks in Treatment: 9 Education Assessment Education Provided To: Patient Education Topics Provided Wound/Skin Impairment: Methods: Explain/Verbal Responses: State content correctly Electronic Signature(s) Signed: 07/22/2020 5:30:45 PM By: Levan Hurst RN, BSN Entered By: Levan Hurst on 07/22/2020 07:51:01 --------------------------------------------------------------------------------  Wound Assessment Details Patient Name: Date of Service: [name], [place] J. 07/22/2020 7:30 A M Medical Record Number: [id number] Patient Account Number: [id number] Date of Birth/Sex: Treating RN: 1960/05/25 (60 y.o. Marcheta Grammes Primary Care Shavonne Ambroise: [name] Other Clinician: Referring Briele Lagasse: Treating Mildred Bollard/Extender: Allayne Butcher, JO HN Weeks in Treatment: 9 Wound Status Wound Number: 3 Primary Venous Leg Ulcer Etiology: Wound Location: Left, Posterior Lower Leg Wound Open Wounding Event: Gradually Appeared Status: Date Acquired: 05/15/2019 Comorbid Anemia, Sleep Apnea, Congestive Heart Failure, Coronary Weeks Of Treatment: 9 History: Artery Disease, Osteoarthritis Clustered Wound: No Photos Wound Measurements Length: (cm) 0.4 Width: (cm) 0.5 Depth: (cm) 0.1 Area: (cm) 0.157 Volume: (cm) 0.016 % Reduction in Area: 99.7% % Reduction in Volume: 99.7% Epithelialization: Medium (34-66%) Tunneling: No Undermining: No Wound Description Classification: Full Thickness Without Exposed Support Structures Wound Margin: Distinct, outline attached Exudate Amount: Small Exudate Type: Serosanguineous Exudate Color: red, brown Foul Odor After Cleansing: No Slough/Fibrino Yes Wound Bed Granulation Amount: Large (67-100%) Exposed Structure Granulation Quality: Red Fascia Exposed: No Necrotic Amount: None Present (0%) Fat Layer (Subcutaneous Tissue) Exposed: Yes Tendon Exposed: No Muscle Exposed: No Joint Exposed: No Bone Exposed: No Electronic Signature(s) Signed: 07/22/2020 5:21:09 PM By: Lorrin Jackson Signed: 07/24/2020 8:29:36 AM By: Sandre Kitty Entered By: Sandre Kitty on 07/22/2020 16:20:27 -------------------------------------------------------------------------------- Vitals Details Patient Name: Date of Service: Marvin Atlas, PA UL J. 07/22/2020 7:30 A M Medical Record Number: [id number] Patient Account Number: [id number] Date of Birth/Sex: Treating RN: 08/02/1960 (60 y.o. Marcheta Grammes Primary Care Rim Thatch: [name] Other Clinician: Referring Montrice Montuori: Treating Amiree No/Extender: Allayne Butcher, JO HN Weeks in Treatment: 9 Vital Signs Time Taken:  07:44 Temperature (F): 98.3 Height (in): 71 Pulse (bpm): 73 Weight (lbs): 210 Respiratory Rate (breaths/min): 18 Body Mass Index (BMI): 29.3 Blood Pressure (mmHg): 119/65 Reference Range: 80 - 120 mg / dl Electronic Signature(s) Signed: 07/22/2020 5:21:09 PM By: Lorrin Jackson Entered By: Lorrin Jackson on 07/22/2020 07:48:11

## 2020-07-25 DIAGNOSIS — I890 Lymphedema, not elsewhere classified: Secondary | ICD-10-CM | POA: Diagnosis not present

## 2020-07-29 ENCOUNTER — Encounter (HOSPITAL_BASED_OUTPATIENT_CLINIC_OR_DEPARTMENT_OTHER): Payer: PPO | Admitting: Internal Medicine

## 2020-07-29 ENCOUNTER — Other Ambulatory Visit: Payer: Self-pay

## 2020-07-29 DIAGNOSIS — I87312 Chronic venous hypertension (idiopathic) with ulcer of left lower extremity: Secondary | ICD-10-CM | POA: Diagnosis not present

## 2020-07-29 DIAGNOSIS — I890 Lymphedema, not elsewhere classified: Secondary | ICD-10-CM | POA: Diagnosis not present

## 2020-07-29 DIAGNOSIS — L97229 Non-pressure chronic ulcer of left calf with unspecified severity: Secondary | ICD-10-CM | POA: Diagnosis not present

## 2020-07-29 DIAGNOSIS — I87333 Chronic venous hypertension (idiopathic) with ulcer and inflammation of bilateral lower extremity: Secondary | ICD-10-CM | POA: Diagnosis not present

## 2020-08-05 DIAGNOSIS — F1111 Opioid abuse, in remission: Secondary | ICD-10-CM | POA: Diagnosis not present

## 2020-08-06 DIAGNOSIS — M810 Age-related osteoporosis without current pathological fracture: Secondary | ICD-10-CM | POA: Diagnosis not present

## 2020-08-06 DIAGNOSIS — M1990 Unspecified osteoarthritis, unspecified site: Secondary | ICD-10-CM | POA: Diagnosis not present

## 2020-08-06 DIAGNOSIS — M25561 Pain in right knee: Secondary | ICD-10-CM | POA: Diagnosis not present

## 2020-08-06 DIAGNOSIS — Z6832 Body mass index (BMI) 32.0-32.9, adult: Secondary | ICD-10-CM | POA: Diagnosis not present

## 2020-08-19 DIAGNOSIS — F1111 Opioid abuse, in remission: Secondary | ICD-10-CM | POA: Diagnosis not present

## 2020-08-23 NOTE — Progress Notes (Signed)
[name], Marvin ([id number]) Visit Report for 07/29/2020 HPI Details Patient Name: Date of Service: [name], [name] 07/29/2020 8:00 A M Medical Record Number: [id number] Patient Account Number: [id number] Date of Birth/Sex: Treating RN: June 02, 1960 (60 y.o. [name] Primary Care Provider: [name] Other Clinician: Referring Provider: Treating Provider/Extender: Talitha Givens HN Weeks in Treatment: 10 History of Present Illness HPI Description: ADMISSION 05/20/2020 This is a 60 year old man who is not a diabetic however he has a long history of chronic venous insufficiency with wounds on his lower extremities. He went to the wound care center in [place] for a long period of time with recurrent wounds in these areas treated with compression wraps. He comes in today with wounds on his bilateral lower legs that he says have been there about a year on the right and about 6 months on the left. He went to see Dr. Oneida Alar on 01/17/2020 who noted that he had no history of DVT but does have a history of venous insufficiency wounds. They noted that he was considering laser ablation in October 2020 but he canceled the procedure. He had an episode of bleeding from varicose veins in 2019. He had repeated venous reflux study on 10/6 which showed no evidence of a DVT or SVT Noted that he had venous reflux in the right greater saphenous vein in the thigh and the calf on the right. Also . noted to have venous reflux in the right femoral vein right popliteal vein and right small saphenous vein. At the time in October they wanted the wounds to heal a bit he also also had a right groin wound which is apparently closed. Not sure about the follow-up with vein and vascular. He comes in today with some light compression wraps and collagen on the wound placed by Dr. Lilia Pro who is a Education officer, environmental in [place]. He has 2 areas on the left leg 1 anteriorly and a large area posteriorly and a  smaller area on the right anterior lower leg. He has marked lower extremity edema which is nonpitting. Hemosiderin deposition on both lower legs extensively. There is dilated venules in his feet. Past medical history includes history of opioid abuse in remission, left nephrolithiasis, history of an ablation although I did not see this in Dr. Oneida Alar notes, congestive heart failure and anemia ABIs in our clinic were noncompressible bilaterally 05/27/2020; we admitted this patient to clinic last week. He has bilateral superficial venous insufficiency with bilateral wounds left greater than right. We put him in 4 layer compression with silver alginate. The he was previously cared for in the [place] wound care clinic when it was open. His wounds are better with compression this week and there is certainly less swelling. 2/21; bilateral superficial venous insufficiency wounds. A cluster on the left lateral. He has very well-defined areas on the right anterior right and right lateral. We are using silver alginate and 4-layer compression 2/28; bilateral superficial venous insufficiency wounds in the setting of significant stage III lymphedema. He has a cluster on the left which is smaller. His area on the right anterior and right lateral also look better. 3/14 superficial bilateral venous insufficiency wounds. Much better on both sides in fact the area on the right posterior is healed. He still has 2 small open areas on the right anterior and the left lateral 3/21; superficial bilateral venous insufficiency wounds. Everything is closed on the right side. Small area still remains on the left. We  will transition him today to his right extremiteaze stocking. Still dressing the remaining wound on the left with compression 3/28; the patient's right leg remains closed in his wraparound stocking. Disappointingly today the area on the left lateral leg looks about the same. I thought this might be closed with been  using silver alginate 4/4; right leg remains in his wraparound stocking.. Left lateral leg measures smaller even under illumination the wound looks good. Changed him to [facility] with additional ABD compression last week 4/11; left lateral posterior very tiny superficial wound. This should be closed by next week. Using Hydrofera Blue. 4/18; left lateral wound is totally epithelialized. He has his own external compression stocking and compression pumps. Electronic Signature(s) Signed: 07/29/2020 5:05:50 PM By: Baltazar Najjar MD Entered By: Baltazar Najjar on 07/29/2020 08:25:42 -------------------------------------------------------------------------------- Physical Exam Details Patient Name: Date of Service: Marvin Burn, [place] UL J. 07/29/2020 8:00 A M Medical Record Number: [id number] Patient Account Number: [id number] Date of Birth/Sex: Treating RN: 09-Sep-1960 (60 y.o. Elizebeth Koller Primary Care Provider: Waynetta Sandy HN Other Clinician: Referring Provider: Treating Provider/Extender: Williemae Area HN Weeks in Treatment: 10 Constitutional Sitting or standing Blood Pressure is within target range for patient.. Pulse regular and within target range for patient.Marland Kitchen Respirations regular, non-labored and within target range.. Temperature is normal and within the target range for the patient.Marland Kitchen Appears in no distress. Cardiovascular His edema control is quite good. Notes Wound exam; left lateral posterior calf wound this is completely epithelialized today. He has excellent edema control. Severe bilateral venous hypertension with hemosiderin deposition dilated venules etc. Electronic Signature(s) Signed: 07/29/2020 5:05:50 PM By: Baltazar Najjar MD Entered By: Baltazar Najjar on 07/29/2020 08:26:44 -------------------------------------------------------------------------------- Physician Orders Details Patient Name: Date of Service: Marvin Burn, [place] UL J. 07/29/2020 8:00 A  M Medical Record Number: [id number] Patient Account Number: [id number] Date of Birth/Sex: Treating RN: Jun 08, 1960 (60 y.o. Elizebeth Koller Primary Care Provider: Waynetta Sandy HN Other Clinician: Referring Provider: Treating Provider/Extender: Williemae Area HN Weeks in Treatment: 10 Verbal / Phone Orders: No Diagnosis Coding ICD-10 Coding Code Description [phone] Chronic venous hypertension (idiopathic) with ulcer and inflammation of bilateral lower extremity I89.0 Lymphedema, not elsewhere classified L97.821 Non-pressure chronic ulcer of other part of left lower leg limited to breakdown of skin Discharge From [facility] Services Discharge from Wound Care Center Edema Control - Lymphedema / SCD / Other Bilateral Lower Extremities Lymphedema Pumps. Use Lymphedema pumps on leg(s) 2-3 times a day for 45-60 minutes. If wearing any wraps or hose, do not remove them. Continue exercising as instructed. - Ok to use pumps over compression wrap/extremit-ease Elevate legs to the level of the heart or above for 30 minutes daily and/or when sitting, a frequency of: - throughout the day Avoid standing for long periods of time. Exercise regularly Moisturize legs daily. Compression stocking or Garment 30-40 mm/Hg pressure to: - Extremit-ease to both legs daily. Apply first thing in the morning, remove at night before bed. Electronic Signature(s) Signed: 07/29/2020 5:05:50 PM By: Baltazar Najjar MD Signed: 07/29/2020 5:39:31 PM By: Zandra Abts RN, BSN Entered By: Zandra Abts on 07/29/2020 08:22:28 -------------------------------------------------------------------------------- Problem List Details Patient Name: Date of Service: Marvin Burn, [place] UL J. 07/29/2020 8:00 A M Medical Record Number: [id number] Patient Account Number: [id number] Date of Birth/Sex: Treating RN: [birth year] (60 y.o. Elizebeth Koller Primary Care Provider: Waynetta Sandy HN Other Clinician: Referring  Provider: Treating Provider/Extender: Lars Pinks, JO HN Weeks in  Treatment: 10 Active Problems ICD-10 Encounter Code Description Active Date MDM Diagnosis I87.333 Chronic venous hypertension (idiopathic) with ulcer and inflammation of 05/20/2020 No Yes bilateral lower extremity I89.0 Lymphedema, not elsewhere classified 05/20/2020 No Yes L97.821 Non-pressure chronic ulcer of other part of left lower leg limited to breakdown 05/20/2020 No Yes of skin Inactive Problems ICD-10 Code Description Active Date Inactive Date L97.811 Non-pressure chronic ulcer of other part of right lower leg limited to breakdown of skin 05/20/2020 05/20/2020 Resolved Problems Electronic Signature(s) Signed: 07/29/2020 5:05:50 PM By: Linton Ham MD Signed: 08/23/2020 2:49:37 PM By: Kalman Shan DO Entered By: Linton Ham on 07/29/2020 08:25:05 -------------------------------------------------------------------------------- Progress Note Details Patient Name: Date of Service: Marvin Atlas, [place] UL J. 07/29/2020 8:00 A M Medical Record Number: [id number] Patient Account Number: [id number] Date of Birth/Sex: Treating RN: 1961/03/12 (60 y.o. [name] Primary Care Provider: [name] Other Clinician: Referring Provider: Treating Provider/Extender: Talitha Givens HN Weeks in Treatment: 10 Subjective History of Present Illness (HPI) ADMISSION 05/20/2020 This is a 60 year old man who is not a diabetic however he has a long history of chronic venous insufficiency with wounds on his lower extremities. He went to the wound care center in [place] for a long period of time with recurrent wounds in these areas treated with compression wraps. He comes in today with wounds on his bilateral lower legs that he says have been there about a year on the right and about 6 months on the left. He went to see Dr. Oneida Alar on 01/17/2020 who noted that he had no history of DVT but does have a  history of venous insufficiency wounds. They noted that he was considering laser ablation in October 2020 but he canceled the procedure. He had an episode of bleeding from varicose veins in 2019. He had repeated venous reflux study on 10/6 which showed no evidence of a DVT or SVT Noted that he had venous reflux in the right greater saphenous vein in the thigh and the calf on the right. Also . noted to have venous reflux in the right femoral vein right popliteal vein and right small saphenous vein. At the time in October they wanted the wounds to heal a bit he also also had a right groin wound which is apparently closed. Not sure about the follow-up with vein and vascular. He comes in today with some light compression wraps and collagen on the wound placed by Dr. Lilia Pro who is a Education officer, environmental in [place]. He has 2 areas on the left leg 1 anteriorly and a large area posteriorly and a smaller area on the right anterior lower leg. He has marked lower extremity edema which is nonpitting. Hemosiderin deposition on both lower legs extensively. There is dilated venules in his feet. Past medical history includes history of opioid abuse in remission, left nephrolithiasis, history of an ablation although I did not see this in Dr. Oneida Alar notes, congestive heart failure and anemia ABIs in our clinic were noncompressible bilaterally 05/27/2020; we admitted this patient to clinic last week. He has bilateral superficial venous insufficiency with bilateral wounds left greater than right. We put him in 4 layer compression with silver alginate. The he was previously cared for in the [facility] wound care clinic when it was open. His wounds are better with compression this week and there is certainly less swelling. 2/21; bilateral superficial venous insufficiency wounds. A cluster on the left lateral. He has very well-defined areas on the right anterior right and  right lateral. We are using silver alginate and 4-layer  compression 2/28; bilateral superficial venous insufficiency wounds in the setting of significant stage III lymphedema. He has a cluster on the left which is smaller. His area on the right anterior and right lateral also look better. 3/14 superficial bilateral venous insufficiency wounds. Much better on both sides in fact the area on the right posterior is healed. He still has 2 small open areas on the right anterior and the left lateral 3/21; superficial bilateral venous insufficiency wounds. Everything is closed on the right side. Small area still remains on the left. We will transition him today to his right extremiteaze stocking. Still dressing the remaining wound on the left with compression 3/28; the patient's right leg remains closed in his wraparound stocking. Disappointingly today the area on the left lateral leg looks about the same. I thought this might be closed with been using silver alginate 4/4; right leg remains in his wraparound stocking.. Left lateral leg measures smaller even under illumination the wound looks good. Changed him to [facility] with additional ABD compression last week 4/11; left lateral posterior very tiny superficial wound. This should be closed by next week. Using Hydrofera Blue. 4/18; left lateral wound is totally epithelialized. He has his own external compression stocking and compression pumps. Objective Constitutional Sitting or standing Blood Pressure is within target range for patient.. Pulse regular and within target range for patient.Marland Kitchen Respirations regular, non-labored and within target range.. Temperature is normal and within the target range for the patient.Marland Kitchen Appears in no distress. Vitals Time Taken: 8:02 AM, Height: 71 in, Weight: 210 lbs, BMI: 29.3, Temperature: 98.2 F, Pulse: 60 bpm, Respiratory Rate: 17 breaths/min, Blood Pressure: 119/80 mmHg. Cardiovascular His edema control is quite good. General Notes: Wound exam; left lateral posterior  calf wound this is completely epithelialized today. He has excellent edema control. Severe bilateral venous hypertension with hemosiderin deposition dilated venules etc. Integumentary (Hair, Skin) Wound #3 status is Healed - Epithelialized. Original cause of wound was Gradually Appeared. The date acquired was: 05/15/2019. The wound has been in treatment 10 weeks. The wound is located on the Left,Posterior Lower Leg. The wound measures 0cm length x 0cm width x 0cm depth; 0cm^2 area and 0cm^3 volume. Assessment Active Problems ICD-10 Chronic venous hypertension (idiopathic) with ulcer and inflammation of bilateral lower extremity Lymphedema, not elsewhere classified Non-pressure chronic ulcer of other part of left lower leg limited to breakdown of skin Plan Discharge From [facility] Services: Discharge from Wound Care Center Edema Control - Lymphedema / SCD / Other: Lymphedema Pumps. Use Lymphedema pumps on leg(s) 2-3 times a day for 45-60 minutes. If wearing any wraps or hose, do not remove them. Continue exercising as instructed. - Ok to use pumps over compression wrap/extremit-ease Elevate legs to the level of the heart or above for 30 minutes daily and/or when sitting, a frequency of: - throughout the day Avoid standing for long periods of time. Exercise regularly Moisturize legs daily. Compression stocking or Garment 30-40 mm/Hg pressure to: - Extremit-ease to both legs daily. Apply first thing in the morning, remove at night before bed. #1 the patient to be discharged from the clinic 2. He has a extremitease stockings. He also has compression pumps. I have recommended twice a day. But I also told him if he notices a lot of swelling then he may need to go to 3 times a day 1 hour 3. Follow-up here as needed Electronic Signature(s) Signed: 07/29/2020 5:05:50 PM By: Leanord Hawking,  Legrand Como MD Entered By: Linton Ham on 07/29/2020  08:27:59 -------------------------------------------------------------------------------- SuperBill Details Patient Name: Date of Service: Marvin Atlas, PA UL J. 07/29/2020 Medical Record Number: [id number] Patient Account Number: [id number] Date of Birth/Sex: Treating RN: [birth year] (60 y.o. [name] Primary Care Provider: [name] Other Clinician: Referring Provider: Treating Provider/Extender: Talitha Givens HN Weeks in Treatment: 10 Diagnosis Coding ICD-10 Codes Code Description [phone] Chronic venous hypertension (idiopathic) with ulcer and inflammation of bilateral lower extremity I89.0 Lymphedema, not elsewhere classified L97.821 Non-pressure chronic ulcer of other part of left lower leg limited to breakdown of skin Facility Procedures CPT4 Code: [id number] Description: 99213 - WOUND CARE VISIT-LEV 3 EST PT Modifier: Quantity: 1 Physician Procedures : CPT4 Code Description Modifier [id number] 99213 - WC PHYS LEVEL 3 - EST PT ICD-10 Diagnosis Description L97.821 Non-pressure chronic ulcer of other part of left lower leg limited to breakdown of skin I87.333 Chronic venous hypertension (idiopathic) with  ulcer and inflammation of bilateral lower extremity I89.0 Lymphedema, not elsewhere classified Quantity: 1 Electronic Signature(s) Signed: 07/29/2020 5:05:50 PM By: Linton Ham MD Entered By: Linton Ham on 07/29/2020 08:28:17

## 2020-09-02 DIAGNOSIS — F1111 Opioid abuse, in remission: Secondary | ICD-10-CM | POA: Diagnosis not present

## 2020-09-16 DIAGNOSIS — F1111 Opioid abuse, in remission: Secondary | ICD-10-CM | POA: Diagnosis not present

## 2020-09-30 DIAGNOSIS — F1111 Opioid abuse, in remission: Secondary | ICD-10-CM | POA: Diagnosis not present

## 2020-10-15 DIAGNOSIS — F1111 Opioid abuse, in remission: Secondary | ICD-10-CM | POA: Diagnosis not present

## 2020-10-28 DIAGNOSIS — F1111 Opioid abuse, in remission: Secondary | ICD-10-CM | POA: Diagnosis not present

## 2020-11-05 DIAGNOSIS — R109 Unspecified abdominal pain: Secondary | ICD-10-CM | POA: Diagnosis not present

## 2020-11-05 DIAGNOSIS — I890 Lymphedema, not elsewhere classified: Secondary | ICD-10-CM | POA: Diagnosis not present

## 2020-11-05 DIAGNOSIS — R202 Paresthesia of skin: Secondary | ICD-10-CM | POA: Diagnosis not present

## 2020-11-05 DIAGNOSIS — M25569 Pain in unspecified knee: Secondary | ICD-10-CM | POA: Diagnosis not present

## 2020-11-05 DIAGNOSIS — Z6832 Body mass index (BMI) 32.0-32.9, adult: Secondary | ICD-10-CM | POA: Diagnosis not present

## 2020-11-05 DIAGNOSIS — M1990 Unspecified osteoarthritis, unspecified site: Secondary | ICD-10-CM | POA: Diagnosis not present

## 2020-11-11 DIAGNOSIS — F1111 Opioid abuse, in remission: Secondary | ICD-10-CM | POA: Diagnosis not present

## 2020-11-21 DIAGNOSIS — M1712 Unilateral primary osteoarthritis, left knee: Secondary | ICD-10-CM | POA: Diagnosis not present

## 2020-11-21 DIAGNOSIS — M1711 Unilateral primary osteoarthritis, right knee: Secondary | ICD-10-CM | POA: Diagnosis not present

## 2020-11-25 DIAGNOSIS — F1111 Opioid abuse, in remission: Secondary | ICD-10-CM | POA: Diagnosis not present

## 2020-12-09 DIAGNOSIS — F1111 Opioid abuse, in remission: Secondary | ICD-10-CM | POA: Diagnosis not present

## 2020-12-23 DIAGNOSIS — F1111 Opioid abuse, in remission: Secondary | ICD-10-CM | POA: Diagnosis not present

## 2020-12-27 ENCOUNTER — Encounter (HOSPITAL_BASED_OUTPATIENT_CLINIC_OR_DEPARTMENT_OTHER): Payer: PPO | Attending: Internal Medicine | Admitting: Internal Medicine

## 2021-01-06 DIAGNOSIS — F1111 Opioid abuse, in remission: Secondary | ICD-10-CM | POA: Diagnosis not present

## 2021-01-10 DIAGNOSIS — G8929 Other chronic pain: Secondary | ICD-10-CM | POA: Diagnosis not present

## 2021-01-10 DIAGNOSIS — D510 Vitamin B12 deficiency anemia due to intrinsic factor deficiency: Secondary | ICD-10-CM | POA: Diagnosis not present

## 2021-01-10 DIAGNOSIS — E7800 Pure hypercholesterolemia, unspecified: Secondary | ICD-10-CM | POA: Diagnosis not present

## 2021-01-20 DIAGNOSIS — F1111 Opioid abuse, in remission: Secondary | ICD-10-CM | POA: Diagnosis not present

## 2021-02-03 DIAGNOSIS — F1111 Opioid abuse, in remission: Secondary | ICD-10-CM | POA: Diagnosis not present

## 2021-02-05 DIAGNOSIS — M1711 Unilateral primary osteoarthritis, right knee: Secondary | ICD-10-CM | POA: Diagnosis not present

## 2021-02-05 DIAGNOSIS — M1712 Unilateral primary osteoarthritis, left knee: Secondary | ICD-10-CM | POA: Diagnosis not present

## 2021-02-10 DIAGNOSIS — D510 Vitamin B12 deficiency anemia due to intrinsic factor deficiency: Secondary | ICD-10-CM | POA: Diagnosis not present

## 2021-02-10 DIAGNOSIS — G8929 Other chronic pain: Secondary | ICD-10-CM | POA: Diagnosis not present

## 2021-02-10 DIAGNOSIS — E7800 Pure hypercholesterolemia, unspecified: Secondary | ICD-10-CM | POA: Diagnosis not present

## 2021-02-17 DIAGNOSIS — F1111 Opioid abuse, in remission: Secondary | ICD-10-CM | POA: Diagnosis not present

## 2021-03-03 DIAGNOSIS — F1111 Opioid abuse, in remission: Secondary | ICD-10-CM | POA: Diagnosis not present

## 2021-03-17 DIAGNOSIS — F1111 Opioid abuse, in remission: Secondary | ICD-10-CM | POA: Diagnosis not present

## 2021-03-31 DIAGNOSIS — F1111 Opioid abuse, in remission: Secondary | ICD-10-CM | POA: Diagnosis not present

## 2021-04-14 DIAGNOSIS — F1111 Opioid abuse, in remission: Secondary | ICD-10-CM | POA: Diagnosis not present

## 2021-04-28 DIAGNOSIS — F1111 Opioid abuse, in remission: Secondary | ICD-10-CM | POA: Diagnosis not present

## 2021-06-05 DIAGNOSIS — I890 Lymphedema, not elsewhere classified: Secondary | ICD-10-CM | POA: Diagnosis not present

## 2021-06-05 DIAGNOSIS — Z6832 Body mass index (BMI) 32.0-32.9, adult: Secondary | ICD-10-CM | POA: Diagnosis not present

## 2021-06-05 DIAGNOSIS — M25562 Pain in left knee: Secondary | ICD-10-CM | POA: Diagnosis not present

## 2021-06-05 DIAGNOSIS — F17200 Nicotine dependence, unspecified, uncomplicated: Secondary | ICD-10-CM | POA: Diagnosis not present

## 2021-06-05 DIAGNOSIS — M25561 Pain in right knee: Secondary | ICD-10-CM | POA: Diagnosis not present

## 2021-06-05 DIAGNOSIS — Z0000 Encounter for general adult medical examination without abnormal findings: Secondary | ICD-10-CM | POA: Diagnosis not present

## 2021-06-09 DIAGNOSIS — F1111 Opioid abuse, in remission: Secondary | ICD-10-CM | POA: Diagnosis not present

## 2021-06-17 DIAGNOSIS — M21861 Other specified acquired deformities of right lower leg: Secondary | ICD-10-CM | POA: Diagnosis not present

## 2021-06-17 DIAGNOSIS — M170 Bilateral primary osteoarthritis of knee: Secondary | ICD-10-CM | POA: Diagnosis not present

## 2021-06-17 DIAGNOSIS — M21862 Other specified acquired deformities of left lower leg: Secondary | ICD-10-CM | POA: Diagnosis not present

## 2021-06-17 DIAGNOSIS — M25561 Pain in right knee: Secondary | ICD-10-CM | POA: Diagnosis not present

## 2021-06-17 DIAGNOSIS — M25562 Pain in left knee: Secondary | ICD-10-CM | POA: Diagnosis not present

## 2021-06-23 DIAGNOSIS — F1111 Opioid abuse, in remission: Secondary | ICD-10-CM | POA: Diagnosis not present

## 2021-06-24 DIAGNOSIS — M25562 Pain in left knee: Secondary | ICD-10-CM | POA: Diagnosis not present

## 2021-06-24 DIAGNOSIS — R262 Difficulty in walking, not elsewhere classified: Secondary | ICD-10-CM | POA: Diagnosis not present

## 2021-06-24 DIAGNOSIS — M6281 Muscle weakness (generalized): Secondary | ICD-10-CM | POA: Diagnosis not present

## 2021-06-24 DIAGNOSIS — M25561 Pain in right knee: Secondary | ICD-10-CM | POA: Diagnosis not present

## 2021-06-27 DIAGNOSIS — R262 Difficulty in walking, not elsewhere classified: Secondary | ICD-10-CM | POA: Diagnosis not present

## 2021-06-27 DIAGNOSIS — M25562 Pain in left knee: Secondary | ICD-10-CM | POA: Diagnosis not present

## 2021-06-27 DIAGNOSIS — M25561 Pain in right knee: Secondary | ICD-10-CM | POA: Diagnosis not present

## 2021-06-27 DIAGNOSIS — M6281 Muscle weakness (generalized): Secondary | ICD-10-CM | POA: Diagnosis not present

## 2021-07-01 DIAGNOSIS — M25562 Pain in left knee: Secondary | ICD-10-CM | POA: Diagnosis not present

## 2021-07-01 DIAGNOSIS — R262 Difficulty in walking, not elsewhere classified: Secondary | ICD-10-CM | POA: Diagnosis not present

## 2021-07-01 DIAGNOSIS — M25561 Pain in right knee: Secondary | ICD-10-CM | POA: Diagnosis not present

## 2021-07-01 DIAGNOSIS — M6281 Muscle weakness (generalized): Secondary | ICD-10-CM | POA: Diagnosis not present

## 2021-07-04 DIAGNOSIS — M6281 Muscle weakness (generalized): Secondary | ICD-10-CM | POA: Diagnosis not present

## 2021-07-04 DIAGNOSIS — R262 Difficulty in walking, not elsewhere classified: Secondary | ICD-10-CM | POA: Diagnosis not present

## 2021-07-04 DIAGNOSIS — M25562 Pain in left knee: Secondary | ICD-10-CM | POA: Diagnosis not present

## 2021-07-04 DIAGNOSIS — M25561 Pain in right knee: Secondary | ICD-10-CM | POA: Diagnosis not present

## 2021-07-07 DIAGNOSIS — F1111 Opioid abuse, in remission: Secondary | ICD-10-CM | POA: Diagnosis not present

## 2021-07-08 DIAGNOSIS — M25561 Pain in right knee: Secondary | ICD-10-CM | POA: Diagnosis not present

## 2021-07-08 DIAGNOSIS — M6281 Muscle weakness (generalized): Secondary | ICD-10-CM | POA: Diagnosis not present

## 2021-07-08 DIAGNOSIS — M25562 Pain in left knee: Secondary | ICD-10-CM | POA: Diagnosis not present

## 2021-07-08 DIAGNOSIS — R262 Difficulty in walking, not elsewhere classified: Secondary | ICD-10-CM | POA: Diagnosis not present

## 2021-07-11 DIAGNOSIS — M25562 Pain in left knee: Secondary | ICD-10-CM | POA: Diagnosis not present

## 2021-07-11 DIAGNOSIS — M25561 Pain in right knee: Secondary | ICD-10-CM | POA: Diagnosis not present

## 2021-07-11 DIAGNOSIS — R262 Difficulty in walking, not elsewhere classified: Secondary | ICD-10-CM | POA: Diagnosis not present

## 2021-07-11 DIAGNOSIS — E785 Hyperlipidemia, unspecified: Secondary | ICD-10-CM | POA: Diagnosis not present

## 2021-07-11 DIAGNOSIS — I10 Essential (primary) hypertension: Secondary | ICD-10-CM | POA: Diagnosis not present

## 2021-07-11 DIAGNOSIS — M6281 Muscle weakness (generalized): Secondary | ICD-10-CM | POA: Diagnosis not present

## 2021-07-14 DIAGNOSIS — M25562 Pain in left knee: Secondary | ICD-10-CM | POA: Diagnosis not present

## 2021-07-14 DIAGNOSIS — M25561 Pain in right knee: Secondary | ICD-10-CM | POA: Diagnosis not present

## 2021-07-14 DIAGNOSIS — M6281 Muscle weakness (generalized): Secondary | ICD-10-CM | POA: Diagnosis not present

## 2021-07-14 DIAGNOSIS — R262 Difficulty in walking, not elsewhere classified: Secondary | ICD-10-CM | POA: Diagnosis not present

## 2021-07-17 DIAGNOSIS — R262 Difficulty in walking, not elsewhere classified: Secondary | ICD-10-CM | POA: Diagnosis not present

## 2021-07-17 DIAGNOSIS — M25561 Pain in right knee: Secondary | ICD-10-CM | POA: Diagnosis not present

## 2021-07-17 DIAGNOSIS — M6281 Muscle weakness (generalized): Secondary | ICD-10-CM | POA: Diagnosis not present

## 2021-07-17 DIAGNOSIS — M25562 Pain in left knee: Secondary | ICD-10-CM | POA: Diagnosis not present

## 2021-07-21 DIAGNOSIS — F1111 Opioid abuse, in remission: Secondary | ICD-10-CM | POA: Diagnosis not present

## 2021-07-22 DIAGNOSIS — M6281 Muscle weakness (generalized): Secondary | ICD-10-CM | POA: Diagnosis not present

## 2021-07-22 DIAGNOSIS — M25562 Pain in left knee: Secondary | ICD-10-CM | POA: Diagnosis not present

## 2021-07-22 DIAGNOSIS — R262 Difficulty in walking, not elsewhere classified: Secondary | ICD-10-CM | POA: Diagnosis not present

## 2021-07-22 DIAGNOSIS — M25561 Pain in right knee: Secondary | ICD-10-CM | POA: Diagnosis not present

## 2021-07-25 DIAGNOSIS — M25561 Pain in right knee: Secondary | ICD-10-CM | POA: Diagnosis not present

## 2021-07-25 DIAGNOSIS — M25562 Pain in left knee: Secondary | ICD-10-CM | POA: Diagnosis not present

## 2021-07-25 DIAGNOSIS — M6281 Muscle weakness (generalized): Secondary | ICD-10-CM | POA: Diagnosis not present

## 2021-07-25 DIAGNOSIS — R262 Difficulty in walking, not elsewhere classified: Secondary | ICD-10-CM | POA: Diagnosis not present

## 2021-07-29 DIAGNOSIS — M6281 Muscle weakness (generalized): Secondary | ICD-10-CM | POA: Diagnosis not present

## 2021-07-29 DIAGNOSIS — M25562 Pain in left knee: Secondary | ICD-10-CM | POA: Diagnosis not present

## 2021-07-29 DIAGNOSIS — R262 Difficulty in walking, not elsewhere classified: Secondary | ICD-10-CM | POA: Diagnosis not present

## 2021-07-29 DIAGNOSIS — M25561 Pain in right knee: Secondary | ICD-10-CM | POA: Diagnosis not present

## 2021-08-01 DIAGNOSIS — R262 Difficulty in walking, not elsewhere classified: Secondary | ICD-10-CM | POA: Diagnosis not present

## 2021-08-01 DIAGNOSIS — M6281 Muscle weakness (generalized): Secondary | ICD-10-CM | POA: Diagnosis not present

## 2021-08-01 DIAGNOSIS — M25561 Pain in right knee: Secondary | ICD-10-CM | POA: Diagnosis not present

## 2021-08-01 DIAGNOSIS — M25562 Pain in left knee: Secondary | ICD-10-CM | POA: Diagnosis not present

## 2021-08-04 DIAGNOSIS — F1111 Opioid abuse, in remission: Secondary | ICD-10-CM | POA: Diagnosis not present

## 2021-08-05 DIAGNOSIS — M25561 Pain in right knee: Secondary | ICD-10-CM | POA: Diagnosis not present

## 2021-08-05 DIAGNOSIS — M25562 Pain in left knee: Secondary | ICD-10-CM | POA: Diagnosis not present

## 2021-08-05 DIAGNOSIS — R262 Difficulty in walking, not elsewhere classified: Secondary | ICD-10-CM | POA: Diagnosis not present

## 2021-08-05 DIAGNOSIS — M6281 Muscle weakness (generalized): Secondary | ICD-10-CM | POA: Diagnosis not present

## 2021-08-08 DIAGNOSIS — R262 Difficulty in walking, not elsewhere classified: Secondary | ICD-10-CM | POA: Diagnosis not present

## 2021-08-08 DIAGNOSIS — M25562 Pain in left knee: Secondary | ICD-10-CM | POA: Diagnosis not present

## 2021-08-08 DIAGNOSIS — M25561 Pain in right knee: Secondary | ICD-10-CM | POA: Diagnosis not present

## 2021-08-08 DIAGNOSIS — M6281 Muscle weakness (generalized): Secondary | ICD-10-CM | POA: Diagnosis not present

## 2021-08-12 DIAGNOSIS — R262 Difficulty in walking, not elsewhere classified: Secondary | ICD-10-CM | POA: Diagnosis not present

## 2021-08-12 DIAGNOSIS — M25562 Pain in left knee: Secondary | ICD-10-CM | POA: Diagnosis not present

## 2021-08-12 DIAGNOSIS — M25561 Pain in right knee: Secondary | ICD-10-CM | POA: Diagnosis not present

## 2021-08-12 DIAGNOSIS — M6281 Muscle weakness (generalized): Secondary | ICD-10-CM | POA: Diagnosis not present

## 2021-08-15 DIAGNOSIS — M25561 Pain in right knee: Secondary | ICD-10-CM | POA: Diagnosis not present

## 2021-08-15 DIAGNOSIS — R262 Difficulty in walking, not elsewhere classified: Secondary | ICD-10-CM | POA: Diagnosis not present

## 2021-08-15 DIAGNOSIS — M6281 Muscle weakness (generalized): Secondary | ICD-10-CM | POA: Diagnosis not present

## 2021-08-15 DIAGNOSIS — M25562 Pain in left knee: Secondary | ICD-10-CM | POA: Diagnosis not present

## 2021-08-18 DIAGNOSIS — F1111 Opioid abuse, in remission: Secondary | ICD-10-CM | POA: Diagnosis not present

## 2021-08-19 DIAGNOSIS — M6281 Muscle weakness (generalized): Secondary | ICD-10-CM | POA: Diagnosis not present

## 2021-08-19 DIAGNOSIS — R262 Difficulty in walking, not elsewhere classified: Secondary | ICD-10-CM | POA: Diagnosis not present

## 2021-08-19 DIAGNOSIS — M25562 Pain in left knee: Secondary | ICD-10-CM | POA: Diagnosis not present

## 2021-08-19 DIAGNOSIS — M25561 Pain in right knee: Secondary | ICD-10-CM | POA: Diagnosis not present

## 2021-08-22 DIAGNOSIS — M25562 Pain in left knee: Secondary | ICD-10-CM | POA: Diagnosis not present

## 2021-08-22 DIAGNOSIS — M25561 Pain in right knee: Secondary | ICD-10-CM | POA: Diagnosis not present

## 2021-08-22 DIAGNOSIS — M6281 Muscle weakness (generalized): Secondary | ICD-10-CM | POA: Diagnosis not present

## 2021-08-22 DIAGNOSIS — R262 Difficulty in walking, not elsewhere classified: Secondary | ICD-10-CM | POA: Diagnosis not present

## 2021-08-26 DIAGNOSIS — M170 Bilateral primary osteoarthritis of knee: Secondary | ICD-10-CM | POA: Diagnosis not present

## 2021-08-27 DIAGNOSIS — M6281 Muscle weakness (generalized): Secondary | ICD-10-CM | POA: Diagnosis not present

## 2021-08-27 DIAGNOSIS — M25562 Pain in left knee: Secondary | ICD-10-CM | POA: Diagnosis not present

## 2021-08-27 DIAGNOSIS — R262 Difficulty in walking, not elsewhere classified: Secondary | ICD-10-CM | POA: Diagnosis not present

## 2021-08-27 DIAGNOSIS — M25561 Pain in right knee: Secondary | ICD-10-CM | POA: Diagnosis not present

## 2021-08-29 DIAGNOSIS — R262 Difficulty in walking, not elsewhere classified: Secondary | ICD-10-CM | POA: Diagnosis not present

## 2021-08-29 DIAGNOSIS — M25561 Pain in right knee: Secondary | ICD-10-CM | POA: Diagnosis not present

## 2021-08-29 DIAGNOSIS — M25562 Pain in left knee: Secondary | ICD-10-CM | POA: Diagnosis not present

## 2021-08-29 DIAGNOSIS — M6281 Muscle weakness (generalized): Secondary | ICD-10-CM | POA: Diagnosis not present

## 2021-09-01 DIAGNOSIS — M25562 Pain in left knee: Secondary | ICD-10-CM | POA: Diagnosis not present

## 2021-09-01 DIAGNOSIS — M6281 Muscle weakness (generalized): Secondary | ICD-10-CM | POA: Diagnosis not present

## 2021-09-01 DIAGNOSIS — R262 Difficulty in walking, not elsewhere classified: Secondary | ICD-10-CM | POA: Diagnosis not present

## 2021-09-01 DIAGNOSIS — M25561 Pain in right knee: Secondary | ICD-10-CM | POA: Diagnosis not present

## 2021-09-01 DIAGNOSIS — F1111 Opioid abuse, in remission: Secondary | ICD-10-CM | POA: Diagnosis not present

## 2021-09-05 DIAGNOSIS — M6281 Muscle weakness (generalized): Secondary | ICD-10-CM | POA: Diagnosis not present

## 2021-09-05 DIAGNOSIS — R262 Difficulty in walking, not elsewhere classified: Secondary | ICD-10-CM | POA: Diagnosis not present

## 2021-09-05 DIAGNOSIS — M25562 Pain in left knee: Secondary | ICD-10-CM | POA: Diagnosis not present

## 2021-09-05 DIAGNOSIS — M25561 Pain in right knee: Secondary | ICD-10-CM | POA: Diagnosis not present

## 2021-09-09 DIAGNOSIS — M25561 Pain in right knee: Secondary | ICD-10-CM | POA: Diagnosis not present

## 2021-09-09 DIAGNOSIS — R262 Difficulty in walking, not elsewhere classified: Secondary | ICD-10-CM | POA: Diagnosis not present

## 2021-09-09 DIAGNOSIS — M6281 Muscle weakness (generalized): Secondary | ICD-10-CM | POA: Diagnosis not present

## 2021-09-09 DIAGNOSIS — M25562 Pain in left knee: Secondary | ICD-10-CM | POA: Diagnosis not present

## 2021-09-12 DIAGNOSIS — M25562 Pain in left knee: Secondary | ICD-10-CM | POA: Diagnosis not present

## 2021-09-12 DIAGNOSIS — M6281 Muscle weakness (generalized): Secondary | ICD-10-CM | POA: Diagnosis not present

## 2021-09-12 DIAGNOSIS — R262 Difficulty in walking, not elsewhere classified: Secondary | ICD-10-CM | POA: Diagnosis not present

## 2021-09-12 DIAGNOSIS — M25561 Pain in right knee: Secondary | ICD-10-CM | POA: Diagnosis not present

## 2021-09-16 DIAGNOSIS — M6281 Muscle weakness (generalized): Secondary | ICD-10-CM | POA: Diagnosis not present

## 2021-09-16 DIAGNOSIS — M25561 Pain in right knee: Secondary | ICD-10-CM | POA: Diagnosis not present

## 2021-09-16 DIAGNOSIS — R262 Difficulty in walking, not elsewhere classified: Secondary | ICD-10-CM | POA: Diagnosis not present

## 2021-09-16 DIAGNOSIS — M25562 Pain in left knee: Secondary | ICD-10-CM | POA: Diagnosis not present

## 2021-09-19 DIAGNOSIS — M25562 Pain in left knee: Secondary | ICD-10-CM | POA: Diagnosis not present

## 2021-09-19 DIAGNOSIS — M25561 Pain in right knee: Secondary | ICD-10-CM | POA: Diagnosis not present

## 2021-09-19 DIAGNOSIS — M6281 Muscle weakness (generalized): Secondary | ICD-10-CM | POA: Diagnosis not present

## 2021-09-19 DIAGNOSIS — R262 Difficulty in walking, not elsewhere classified: Secondary | ICD-10-CM | POA: Diagnosis not present

## 2021-09-23 DIAGNOSIS — M25562 Pain in left knee: Secondary | ICD-10-CM | POA: Diagnosis not present

## 2021-09-23 DIAGNOSIS — M25561 Pain in right knee: Secondary | ICD-10-CM | POA: Diagnosis not present

## 2021-09-23 DIAGNOSIS — M6281 Muscle weakness (generalized): Secondary | ICD-10-CM | POA: Diagnosis not present

## 2021-09-23 DIAGNOSIS — R262 Difficulty in walking, not elsewhere classified: Secondary | ICD-10-CM | POA: Diagnosis not present

## 2021-09-26 DIAGNOSIS — M6281 Muscle weakness (generalized): Secondary | ICD-10-CM | POA: Diagnosis not present

## 2021-09-26 DIAGNOSIS — M25561 Pain in right knee: Secondary | ICD-10-CM | POA: Diagnosis not present

## 2021-09-26 DIAGNOSIS — M25562 Pain in left knee: Secondary | ICD-10-CM | POA: Diagnosis not present

## 2021-09-26 DIAGNOSIS — R262 Difficulty in walking, not elsewhere classified: Secondary | ICD-10-CM | POA: Diagnosis not present

## 2021-09-30 DIAGNOSIS — R262 Difficulty in walking, not elsewhere classified: Secondary | ICD-10-CM | POA: Diagnosis not present

## 2021-09-30 DIAGNOSIS — M6281 Muscle weakness (generalized): Secondary | ICD-10-CM | POA: Diagnosis not present

## 2021-09-30 DIAGNOSIS — M25562 Pain in left knee: Secondary | ICD-10-CM | POA: Diagnosis not present

## 2021-09-30 DIAGNOSIS — M25561 Pain in right knee: Secondary | ICD-10-CM | POA: Diagnosis not present

## 2021-10-03 DIAGNOSIS — M6281 Muscle weakness (generalized): Secondary | ICD-10-CM | POA: Diagnosis not present

## 2021-10-03 DIAGNOSIS — M25561 Pain in right knee: Secondary | ICD-10-CM | POA: Diagnosis not present

## 2021-10-03 DIAGNOSIS — R262 Difficulty in walking, not elsewhere classified: Secondary | ICD-10-CM | POA: Diagnosis not present

## 2021-10-03 DIAGNOSIS — M25562 Pain in left knee: Secondary | ICD-10-CM | POA: Diagnosis not present

## 2021-10-07 DIAGNOSIS — M25561 Pain in right knee: Secondary | ICD-10-CM | POA: Diagnosis not present

## 2021-10-07 DIAGNOSIS — M6281 Muscle weakness (generalized): Secondary | ICD-10-CM | POA: Diagnosis not present

## 2021-10-07 DIAGNOSIS — M25562 Pain in left knee: Secondary | ICD-10-CM | POA: Diagnosis not present

## 2021-10-07 DIAGNOSIS — R262 Difficulty in walking, not elsewhere classified: Secondary | ICD-10-CM | POA: Diagnosis not present

## 2021-10-09 DIAGNOSIS — M6281 Muscle weakness (generalized): Secondary | ICD-10-CM | POA: Diagnosis not present

## 2021-10-09 DIAGNOSIS — R262 Difficulty in walking, not elsewhere classified: Secondary | ICD-10-CM | POA: Diagnosis not present

## 2021-10-09 DIAGNOSIS — M25561 Pain in right knee: Secondary | ICD-10-CM | POA: Diagnosis not present

## 2021-10-09 DIAGNOSIS — M25562 Pain in left knee: Secondary | ICD-10-CM | POA: Diagnosis not present

## 2021-10-13 DIAGNOSIS — M25561 Pain in right knee: Secondary | ICD-10-CM | POA: Diagnosis not present

## 2021-10-13 DIAGNOSIS — M6281 Muscle weakness (generalized): Secondary | ICD-10-CM | POA: Diagnosis not present

## 2021-10-13 DIAGNOSIS — R262 Difficulty in walking, not elsewhere classified: Secondary | ICD-10-CM | POA: Diagnosis not present

## 2021-10-13 DIAGNOSIS — M25562 Pain in left knee: Secondary | ICD-10-CM | POA: Diagnosis not present

## 2021-10-17 DIAGNOSIS — M6281 Muscle weakness (generalized): Secondary | ICD-10-CM | POA: Diagnosis not present

## 2021-10-17 DIAGNOSIS — R262 Difficulty in walking, not elsewhere classified: Secondary | ICD-10-CM | POA: Diagnosis not present

## 2021-10-17 DIAGNOSIS — M25561 Pain in right knee: Secondary | ICD-10-CM | POA: Diagnosis not present

## 2021-10-17 DIAGNOSIS — M25562 Pain in left knee: Secondary | ICD-10-CM | POA: Diagnosis not present

## 2021-10-20 DIAGNOSIS — M6281 Muscle weakness (generalized): Secondary | ICD-10-CM | POA: Diagnosis not present

## 2021-10-20 DIAGNOSIS — M25562 Pain in left knee: Secondary | ICD-10-CM | POA: Diagnosis not present

## 2021-10-20 DIAGNOSIS — M25561 Pain in right knee: Secondary | ICD-10-CM | POA: Diagnosis not present

## 2021-10-20 DIAGNOSIS — R262 Difficulty in walking, not elsewhere classified: Secondary | ICD-10-CM | POA: Diagnosis not present

## 2021-10-22 DIAGNOSIS — M170 Bilateral primary osteoarthritis of knee: Secondary | ICD-10-CM | POA: Insufficient documentation

## 2021-10-23 DIAGNOSIS — M25562 Pain in left knee: Secondary | ICD-10-CM | POA: Diagnosis not present

## 2021-10-23 DIAGNOSIS — M6281 Muscle weakness (generalized): Secondary | ICD-10-CM | POA: Diagnosis not present

## 2021-10-23 DIAGNOSIS — R262 Difficulty in walking, not elsewhere classified: Secondary | ICD-10-CM | POA: Diagnosis not present

## 2021-10-23 DIAGNOSIS — M25561 Pain in right knee: Secondary | ICD-10-CM | POA: Diagnosis not present

## 2021-10-30 DIAGNOSIS — R262 Difficulty in walking, not elsewhere classified: Secondary | ICD-10-CM | POA: Diagnosis not present

## 2021-10-30 DIAGNOSIS — M6281 Muscle weakness (generalized): Secondary | ICD-10-CM | POA: Diagnosis not present

## 2021-10-30 DIAGNOSIS — M25562 Pain in left knee: Secondary | ICD-10-CM | POA: Diagnosis not present

## 2021-10-30 DIAGNOSIS — M25561 Pain in right knee: Secondary | ICD-10-CM | POA: Diagnosis not present

## 2021-11-17 DIAGNOSIS — F1111 Opioid abuse, in remission: Secondary | ICD-10-CM | POA: Diagnosis not present

## 2021-11-25 DIAGNOSIS — G8929 Other chronic pain: Secondary | ICD-10-CM | POA: Diagnosis not present

## 2021-11-25 DIAGNOSIS — M25561 Pain in right knee: Secondary | ICD-10-CM | POA: Diagnosis not present

## 2021-11-25 DIAGNOSIS — M170 Bilateral primary osteoarthritis of knee: Secondary | ICD-10-CM | POA: Diagnosis not present

## 2021-11-25 DIAGNOSIS — M25562 Pain in left knee: Secondary | ICD-10-CM | POA: Diagnosis not present

## 2021-12-01 DIAGNOSIS — F1111 Opioid abuse, in remission: Secondary | ICD-10-CM | POA: Diagnosis not present

## 2021-12-04 DIAGNOSIS — I890 Lymphedema, not elsewhere classified: Secondary | ICD-10-CM | POA: Diagnosis not present

## 2021-12-04 DIAGNOSIS — Z91199 Patient's noncompliance with other medical treatment and regimen due to unspecified reason: Secondary | ICD-10-CM | POA: Diagnosis not present

## 2021-12-04 DIAGNOSIS — R310 Gross hematuria: Secondary | ICD-10-CM | POA: Diagnosis not present

## 2021-12-04 DIAGNOSIS — Z6831 Body mass index (BMI) 31.0-31.9, adult: Secondary | ICD-10-CM | POA: Diagnosis not present

## 2021-12-05 DIAGNOSIS — R310 Gross hematuria: Secondary | ICD-10-CM | POA: Diagnosis not present

## 2021-12-05 DIAGNOSIS — R590 Localized enlarged lymph nodes: Secondary | ICD-10-CM | POA: Diagnosis not present

## 2021-12-05 DIAGNOSIS — N200 Calculus of kidney: Secondary | ICD-10-CM | POA: Diagnosis not present

## 2021-12-05 DIAGNOSIS — K760 Fatty (change of) liver, not elsewhere classified: Secondary | ICD-10-CM | POA: Diagnosis not present

## 2021-12-16 DIAGNOSIS — F1111 Opioid abuse, in remission: Secondary | ICD-10-CM | POA: Diagnosis not present

## 2021-12-23 ENCOUNTER — Telehealth: Payer: Self-pay

## 2021-12-23 ENCOUNTER — Other Ambulatory Visit: Payer: Self-pay

## 2021-12-23 ENCOUNTER — Ambulatory Visit: Payer: PPO | Attending: Cardiology | Admitting: Cardiology

## 2021-12-23 ENCOUNTER — Other Ambulatory Visit (HOSPITAL_COMMUNITY): Payer: Self-pay

## 2021-12-23 ENCOUNTER — Encounter: Payer: Self-pay | Admitting: Cardiology

## 2021-12-23 DIAGNOSIS — G4733 Obstructive sleep apnea (adult) (pediatric): Secondary | ICD-10-CM | POA: Diagnosis not present

## 2021-12-23 DIAGNOSIS — R079 Chest pain, unspecified: Secondary | ICD-10-CM

## 2021-12-23 DIAGNOSIS — E785 Hyperlipidemia, unspecified: Secondary | ICD-10-CM

## 2021-12-23 DIAGNOSIS — I872 Venous insufficiency (chronic) (peripheral): Secondary | ICD-10-CM | POA: Diagnosis not present

## 2021-12-23 DIAGNOSIS — Z01810 Encounter for preprocedural cardiovascular examination: Secondary | ICD-10-CM

## 2021-12-23 DIAGNOSIS — R0609 Other forms of dyspnea: Secondary | ICD-10-CM

## 2021-12-23 MED ORDER — METOPROLOL TARTRATE 100 MG PO TABS: ORAL_TABLET | ORAL | 0 refills | Status: DC

## 2021-12-23 NOTE — Telephone Encounter (Signed)
   [facility] Pre-operative Risk Assessment    Request for surgical clearance:  What type of surgery is being performed? Total Knee Replacement site not specified   When is this surgery scheduled? TBD   What type of clearance is required (medical clearance vs. Pharmacy clearance to hold med vs. Both)? Both  Are there any medications that need to be held prior to surgery and how long?Not specified   Practice name and name of physician performing surgery? Dr. [name] at [facility].    What is your office phone number: [phone]    7.   What is your office fax number: [phone] and [phone]  8.   Anesthesia type (None, local, MAC, general) ? Spinal Anesthesia   [name] 12/23/2021, 11:46 AM  _________________________________________________________________   (provider comments below)

## 2021-12-23 NOTE — Progress Notes (Unsigned)
Cardiology Office Note:    Date:  12/23/2021   ID:  [name], DOB [birth year], MRN [id number]  PCP:  [name], MD  Cardiologist:  [name], MD    Referring MD: [name], MD   Chief Complaint  Patient presents with   Clearance TBD    Bilateral knee replacement with Dr. [name]    History of Present Illness:    [name] is a 61 y.o. male with unclear past medical history.  There is a lot of diagnosis in the chart but he denies having any of these problems for example there is diagnosis of coronary artery disease he said he never had a problem with that he was diagnosed with congestive heart failure he tells me he never did an issue with that.  I did see him 1 time so far in 2021 when he was referred to me because of chronic status of his lower extremities.  DVT study has been done which showed no significant DVT.  He was referred back to [place] because he required bilateral knee replacement surgery.  He tells me he is doing fine, he denies have any cardiac complaints but at the same time he cannot do much because of pain in his knees.  He does not take any medications.  He was on aspirin and statin but decided to stop it himself.  He also did not follow-up with my last appointment.  Past Medical History:  Diagnosis Date   Anemia    Arthritis    CAD (coronary artery disease)    CHF (congestive heart failure) ([place])    PT. DENIES AT [facility]   Depression    Heart murmur    hx of small murmur    Hepatitis    Hepatitis C not treated for Hx of   History of kidney stones    Lymphocele    Right groin   Morbid obesity (HCC)    Pneumonia    Sleep apnea    hx of had gastic bypass   Venous insufficiency of leg    Venous stasis dermatitis     Past Surgical History:  Procedure Laterality Date   CARPAL TUNNEL RELEASE     bilateral    CORONARY ARTERY BYPASS GRAFT     CYSTOSCOPY W/ URETERAL STENT PLACEMENT Bilateral 04/21/2016    Procedure: CYSTOSCOPY WITH RETROGRADE PYELOGRAM/URETERAL STENT PLACEMENT;  Surgeon: [name], MD;  Location: [facility];  Service: Urology;  Laterality: Bilateral;   CYSTOSCOPY/URETEROSCOPY/HOLMIUM LASER/STENT PLACEMENT Bilateral 04/01/2016   Procedure: CYSTOSCOPY WITH RETROGRADE AND  STENT PLACEMENT;  Surgeon: [name], MD;  Location: [facility];  Service: Urology;  Laterality: Bilateral;   CYSTOSCOPY/URETEROSCOPY/HOLMIUM LASER/STENT PLACEMENT Bilateral 04/21/2016   Procedure: CYSTOSCOPY/URETEROSCOPY/HOLMIUM LASER/STENT REPLACEMENT;  Surgeon: [name], MD;  Location: [facility];  Service: Urology;  Laterality: Bilateral;   EYE SURGERY     bil cataracts   gastric bypass surgery   10/2015   GASTROPLASTY DUODENAL SWITCH  10/17/2015   HERNIA REPAIR     HIP FRACTURE SURGERY Right 10/17/2019   Using Gamma Nail performed by Dr. Donivan Scull   HOLMIUM LASER APPLICATION Bilateral 05/18/2351   Procedure: HOLMIUM LASER APPLICATION;  Surgeon: [name], MD;  Location: [facility];  Service: Urology;  Laterality: Bilateral;   IR URETERAL STENT LEFT NEW ACCESS W/O SEP NEPHROSTOMY CATH  05/17/2018   LYMPH NODE BIOPSY Right 01/29/2017   Inguinal, performed by Dr. Noberto Retort  NEPHROLITHOTOMY Left 05/17/2018   Procedure: NEPHROLITHOTOMY PERCUTANEOUS;  Surgeon: [name], MD;  Location: [facility];  Service: Urology;  Laterality: Left;    Current Medications: Current Meds  Medication Sig   methadone (METHADOSE) 40 MG disintegrating tablet Take 195 mg by mouth daily.    [DISCONTINUED] alendronate (FOSAMAX) 70 MG tablet Take 70 mg by mouth once a week.   [DISCONTINUED] aspirin 325 MG tablet Take 325 mg by mouth daily.   [DISCONTINUED] potassium chloride (KLOR-CON) 10 MEQ tablet Take 1 tablet (10 mEq total) by mouth daily.   [DISCONTINUED] rosuvastatin (CRESTOR) 5 MG tablet Take 1 tablet by mouth daily.   [DISCONTINUED] torsemide (DEMADEX) 20 MG tablet Take 1 tablet (20 mg total) by mouth 2 (two) times  daily.     Allergies:   Patient has no known allergies.   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  Lives in [place], [place].  4 children all in good health.  3 sisters in good health.  Father in poor health near by   Social Determinants of Health   Financial Resource Strain: Not on file  Food Insecurity: Not on file  Transportation Needs: Not on file  Physical Activity: Not on file  Stress: Not on file  Social Connections: Not on file     Family History: The patient's family history includes Colon cancer in his father; Diabetes in his father; Heart disease in his father; Hypertension in his father; Leukemia in his mother. ROS:   Please see the history of present illness.    All 14 point review of systems negative except as described per history of present illness  EKGs/Labs/Other Studies Reviewed:      Recent Labs: No results found for requested labs within last 365 days.  Recent Lipid Panel No results found for: "CHOL", "TRIG", "HDL", "CHOLHDL", "VLDL", "LDLCALC", "LDLDIRECT"  Physical Exam:    VS:  BP 110/66 (BP Location: Left Arm, Patient Position: Sitting)   Pulse  70   Ht '5\' 10"'$  (1.778 m)   Wt 231 lb 6.4 oz (105 kg)   SpO2 96%   BMI 33.20 kg/m     Wt Readings from Last 3 Encounters:  12/23/21 231 lb 6.4 oz (105 kg)  01/17/20 215 lb (97.5 kg)  12/01/19 227 lb (103 kg)     GEN:  Well nourished, well developed in no acute distress HEENT: Normal NECK: No JVD; No carotid bruits LYMPHATICS: No lymphadenopathy CARDIAC: RRR, no murmurs, no rubs, no gallops RESPIRATORY:  Clear  to auscultation without rales, wheezing or rhonchi  ABDOMEN: Soft, non-tender, non-distended MUSCULOSKELETAL:  No edema; No deformity  SKIN: Warm and dry LOWER EXTREMITIES: no swelling NEUROLOGIC:  Alert and oriented x 3 PSYCHIATRIC:  Normal affect   ASSESSMENT:    1. Morbid obesity ([place])   2. Preop cardiovascular exam   3. Obstructive sleep apnea syndrome   4. Venous insufficiency of both lower extremities    PLAN:    In order of problems listed above:  Cardiovascular preop evaluation for this gentleman with suspicion of past medical history.  I think we need to reassess his left ventricle ejection fraction.  I strongly suggested to have an echocardiogram done.  We also need to look at his coronary arteries to see if he has any significant coronary artery disease.  He tells me that he does have any symptoms suggesting reactivation of the problem but at the same time his ability to exercise is very limited because of his knee problem.  Therefore, I will ask him to have coronary CT angio. Dyslipidemia: I will ask her to have fasting lipid profile today. There is a information about carotic arterial disease in the chart we will talk about potentially doing study to evaluate for it. Overall is a very complicated situation he is completely ignorant about his health issue.  There are some information in the chart that probably not true.  We will do above-mentioned testing to clarify that   Medication Adjustments/Labs and Tests Ordered: Current medicines are reviewed at  length with the patient today.  Concerns regarding medicines are outlined above.  No orders of the defined types were placed in this encounter.  Medication changes: No orders of the defined types were placed in this encounter.   Signed, Park Liter, MD, [facility] 12/23/2021 2:42 PM    [facility]

## 2021-12-23 NOTE — Patient Instructions (Signed)
Medication Instructions:    Metoprolol tartrate 100 mg 1 tablet 2 hours prior to CT Scan  *If you need a refill on your cardiac medications before your next appointment, please call your pharmacy*   Lab Work: BMP, Lipid - today  If you have labs (blood work) drawn today and your tests are completely normal, you will receive your results only by: [place] (if you have MyChart) OR A paper copy in the mail If you have any lab test that is abnormal or we need to change your treatment, we will call you to review the results.   Testing/Procedures:   Your cardiac CT will be scheduled at one of the below locations:   [facility] [address] [place], [place] [address] [phone]   At [facility], please arrive at the [facility] Entrance (Entrance C2) of [facility] 30 minutes prior to test start time. You can use the FREE valet parking offered at entrance C (encouraged to control the heart rate for the test)  Proceed to the [facility] Radiology Department (first floor) to check-in and test prep.  All radiology patients and guests should use entrance C2 at [facility], accessed from [facility], even though the hospital's physical address listed is [address]      Please follow these instructions carefully (unless otherwise directed):  Hold all erectile dysfunction medications at least 3 days (72 hrs) prior to test.  On the Night Before the Test: Be sure to Drink plenty of water. Do not consume any caffeinated/decaffeinated beverages or chocolate 12 hours prior to your test. Do not take any antihistamines 12 hours prior to your test.   On the Day of the Test: Drink plenty of water until 1 hour prior to the test. Do not eat any food 4 hours prior to the test. You may take your regular medications prior to the test.  Take metoprolol (Lopressor) two hours prior to test.           After  the Test: Drink plenty of water. After receiving IV contrast, you may experience a mild flushed feeling. This is normal. On occasion, you may experience a mild rash up to 24 hours after the test. This is not dangerous. If this occurs, you can take Benadryl 25 mg and increase your fluid intake. If you experience trouble breathing, this can be serious. If it is severe call 911 IMMEDIATELY. If it is mild, please call our office. If you take any of these medications: Glipizide/Metformin, Avandament, Glucavance, please do not take 48 hours after completing test unless otherwise instructed.  We will call to schedule your test 2-4 weeks out understanding that some insurance companies will need an authorization prior to the service being performed.   For non-scheduling related questions, please contact the cardiac imaging nurse navigator should you have any questions/concerns: Marchia Bond, Cardiac Imaging Nurse Navigator Gordy Clement, Cardiac Imaging Nurse Navigator  Heart and Vascular Services Direct Office Dial: [phone]   For scheduling needs, including cancellations and rescheduling, please call [place], [phone].    Follow-Up: At [facility], you and your health needs are our priority.  As part of our continuing mission to provide you with exceptional heart care, we have created designated Provider Care Teams.  These Care Teams include your primary Cardiologist (physician) and Advanced Practice Providers (APPs -  Physician Assistants and Nurse Practitioners) who all work together to provide you with the care you  need, when you need it.  We recommend signing up for the patient portal called "MyChart".  Sign up information is provided on this After Visit Summary.  MyChart is used to connect with patients for Virtual Visits (Telemedicine).  Patients are able to view lab/test results, encounter notes, upcoming appointments, etc.  Non-urgent messages can be sent to your provider  as well.   To learn more about what you can do with MyChart, go to [email or web address].    Your next appointment:   3 month(s)  The format for your next appointment:   In Person  Provider:   [name], MD   Other Instructions Cardiac CT Angiogram A cardiac CT angiogram is a procedure to look at the heart and the area around the heart. It may be done to help find the cause of chest pains or other symptoms of heart disease. During this procedure, a substance called contrast dye is injected into the blood vessels in the area to be checked. A large X-ray machine, called a CT scanner, then takes detailed pictures of the heart and the surrounding area. The procedure is also sometimes called a coronary CT angiogram, coronary artery scanning, or CTA. A cardiac CT angiogram allows the health care provider to see how well blood is flowing to and from the heart. The health care provider will be able to see if there are any problems, such as: Blockage or narrowing of the coronary arteries in the heart. Fluid around the heart. Signs of weakness or disease in the muscles, valves, and tissues of the heart. Tell a health care provider about: Any allergies you have. This is especially important if you have had a previous allergic reaction to contrast dye. All medicines you are taking, including vitamins, herbs, eye drops, creams, and over-the-counter medicines. Any blood disorders you have. Any surgeries you have had. Any medical conditions you have. Whether you are pregnant or may be pregnant. Any anxiety disorders, chronic pain, or other conditions you have that may increase your stress or prevent you from lying still. What are the risks? Generally, this is a safe procedure. However, problems may occur, including: Bleeding. Infection. Allergic reactions to medicines or dyes. Damage to other structures or organs. Kidney damage from the contrast dye that is used. Increased risk of cancer  from radiation exposure. This risk is low. Talk with your health care provider about: The risks and benefits of testing. How you can receive the lowest dose of radiation. What happens before the procedure? Wear comfortable clothing and remove any jewelry, glasses, dentures, and hearing aids. Follow instructions from your health care provider about eating and drinking. This may include: For 12 hours before the procedure -- avoid caffeine. This includes tea, coffee, soda, energy drinks, and diet pills. Drink plenty of water or other fluids that do not have caffeine in them. Being well hydrated can prevent complications. For 4-6 hours before the procedure -- stop eating and drinking. The contrast dye can cause nausea, but this is less likely if your stomach is empty. Ask your health care provider about changing or stopping your regular medicines. This is especially important if you are taking diabetes medicines, blood thinners, or medicines to treat problems with erections (erectile dysfunction). What happens during the procedure?  Hair on your chest may need to be removed so that small sticky patches called electrodes can be placed on your chest. These will transmit information that helps to monitor your heart during the procedure. An IV will be inserted  into one of your veins. You might be given a medicine to control your heart rate during the procedure. This will help to ensure that good images are obtained. You will be asked to lie on an exam table. This table will slide in and out of the CT machine during the procedure. Contrast dye will be injected into the IV. You might feel warm, or you may get a metallic taste in your mouth. You will be given a medicine called nitroglycerin. This will relax or dilate the arteries in your heart. The table that you are lying on will move into the CT machine tunnel for the scan. The person running the machine will give you instructions while the scans are being  done. You may be asked to: Keep your arms above your head. Hold your breath. Stay very still, even if the table is moving. When the scanning is complete, you will be moved out of the machine. The IV will be removed. The procedure may vary among health care providers and hospitals. What can I expect after the procedure? After your procedure, it is common to have: A metallic taste in your mouth from the contrast dye. A feeling of warmth. A headache from the nitroglycerin. Follow these instructions at home: Take over-the-counter and prescription medicines only as told by your health care provider. If you are told, drink enough fluid to keep your urine pale yellow. This will help to flush the contrast dye out of your body. Most people can return to their normal activities right after the procedure. Ask your health care provider what activities are safe for you. It is up to you to get the results of your procedure. Ask your health care provider, or the department that is doing the procedure, when your results will be ready. Keep all follow-up visits as told by your health care provider. This is important. Contact a health care provider if: You have any symptoms of allergy to the contrast dye. These include: Shortness of breath. Rash or hives. A racing heartbeat. Summary A cardiac CT angiogram is a procedure to look at the heart and the area around the heart. It may be done to help find the cause of chest pains or other symptoms of heart disease. During this procedure, a large X-ray machine, called a CT scanner, takes detailed pictures of the heart and the surrounding area after a contrast dye has been injected into blood vessels in the area. Ask your health care provider about changing or stopping your regular medicines before the procedure. This is especially important if you are taking diabetes medicines, blood thinners, or medicines to treat erectile dysfunction. If you are told, drink  enough fluid to keep your urine pale yellow. This will help to flush the contrast dye out of your body. This information is not intended to replace advice given to you by your health care provider. Make sure you discuss any questions you have with your health care provider. Document Revised: 11/23/2018 Document Reviewed: 11/23/2018 Elsevier Patient Education  [place].

## 2021-12-24 LAB — BASIC METABOLIC PANEL
BUN/Creatinine Ratio: 14 (ref 10–24)
BUN: 10 mg/dL (ref 8–27)
CO2: 28 mmol/L (ref 20–29)
Calcium: 9 mg/dL (ref 8.6–10.2)
Chloride: 103 mmol/L (ref 96–106)
Creatinine, Ser: 0.74 mg/dL — ABNORMAL LOW (ref 0.76–1.27)
Glucose: 83 mg/dL (ref 70–99)
Potassium: 4.2 mmol/L (ref 3.5–5.2)
Sodium: 141 mmol/L (ref 134–144)
eGFR: 104 mL/min/{1.73_m2} (ref >59)

## 2021-12-24 LAB — LIPID PANEL
Chol/HDL Ratio: 2.8 ratio (ref 0.0–5.0)
Cholesterol, Total: 149 mg/dL (ref 100–199)
HDL: 53 mg/dL (ref >39)
LDL Chol Calc (NIH): 82 mg/dL (ref 0–99)
Triglycerides: 70 mg/dL (ref 0–149)
VLDL Cholesterol Cal: 14 mg/dL (ref 5–40)

## 2021-12-25 NOTE — Telephone Encounter (Signed)
   Patient Name: [name]  DOB: 26-Jun-1960 MRN: [id number]  Primary Cardiologist: Dr. Agustin Cree  Chart reviewed as part of pre-operative protocol coverage. Patient was already seen by Dr. Agustin Cree on 12/23/21 for pre-op cardiovascular examination at which time further testing was recommended.  Per office protocol, the provider who saw the patient should forward their finalized clearance decision to requesting party below upon completion of testing. I will route this message to Dr. Agustin Cree so they are aware of the requesting party to fax final recommendations to.  I will route this message to requesting party so they're aware that final recommendations are forthcoming from evaluating provider. I remove this message from the pre-op box as separate preop APP input is not needed at this time.  Charlie Pitter, PA-C 12/25/2021, 12:13 PM

## 2021-12-29 DIAGNOSIS — F1111 Opioid abuse, in remission: Secondary | ICD-10-CM | POA: Diagnosis not present

## 2022-01-05 ENCOUNTER — Telehealth: Payer: Self-pay

## 2022-01-05 NOTE — Telephone Encounter (Signed)
Message sent by My Chart with normal results per Dr. Wendy Poet note.

## 2022-01-12 DIAGNOSIS — F1111 Opioid abuse, in remission: Secondary | ICD-10-CM | POA: Diagnosis not present

## 2022-01-14 ENCOUNTER — Telehealth (HOSPITAL_COMMUNITY): Payer: Self-pay | Admitting: Emergency Medicine

## 2022-01-14 NOTE — Telephone Encounter (Signed)
Attempted to call patient regarding upcoming cardiac CT appointment. °Left message on voicemail with name and callback number °Synda Bagent RN Navigator Cardiac Imaging °[facility] °[phone] Office °[phone] Cell ° °

## 2022-01-15 ENCOUNTER — Ambulatory Visit (HOSPITAL_COMMUNITY)
Admission: RE | Admit: 2022-01-15 | Discharge: 2022-01-15 | Disposition: A | Discharge status: Home / Self Care | Payer: PPO | Source: Ambulatory Visit | Attending: Cardiology | Admitting: Cardiology

## 2022-01-15 DIAGNOSIS — I2510 Atherosclerotic heart disease of native coronary artery without angina pectoris: Secondary | ICD-10-CM | POA: Diagnosis not present

## 2022-01-15 DIAGNOSIS — R079 Chest pain, unspecified: Secondary | ICD-10-CM | POA: Insufficient documentation

## 2022-01-15 DIAGNOSIS — R0609 Other forms of dyspnea: Secondary | ICD-10-CM | POA: Diagnosis not present

## 2022-01-15 MED ORDER — NITROGLYCERIN 0.4 MG SL SUBL
0.8000 mg | SUBLINGUAL_TABLET | Freq: Once | SUBLINGUAL | Status: AC
  Administered 2022-01-15: 0.8 mg via SUBLINGUAL

## 2022-01-15 MED ORDER — IOHEXOL 350 MG/ML SOLN
95.0000 mL | Freq: Once | INTRAVENOUS | Status: AC | PRN
  Administered 2022-01-15: 95 mL via INTRAVENOUS

## 2022-01-15 MED ORDER — NITROGLYCERIN 0.4 MG SL SUBL
SUBLINGUAL_TABLET | SUBLINGUAL | Status: AC
  Filled 2022-01-15: qty 2

## 2022-01-26 DIAGNOSIS — F1111 Opioid abuse, in remission: Secondary | ICD-10-CM | POA: Diagnosis not present

## 2022-01-27 ENCOUNTER — Telehealth: Payer: Self-pay | Admitting: Cardiology

## 2022-01-27 NOTE — Telephone Encounter (Signed)
LVM for pt to return call about results.

## 2022-01-27 NOTE — Telephone Encounter (Signed)
Patient returned RN's call. 

## 2022-02-03 ENCOUNTER — Encounter (HOSPITAL_BASED_OUTPATIENT_CLINIC_OR_DEPARTMENT_OTHER): Payer: PPO | Attending: Internal Medicine | Admitting: General Surgery

## 2022-02-03 DIAGNOSIS — I872 Venous insufficiency (chronic) (peripheral): Secondary | ICD-10-CM | POA: Diagnosis not present

## 2022-02-03 DIAGNOSIS — I87331 Chronic venous hypertension (idiopathic) with ulcer and inflammation of right lower extremity: Secondary | ICD-10-CM | POA: Diagnosis not present

## 2022-02-03 DIAGNOSIS — L97812 Non-pressure chronic ulcer of other part of right lower leg with fat layer exposed: Secondary | ICD-10-CM | POA: Diagnosis not present

## 2022-02-03 DIAGNOSIS — L97512 Non-pressure chronic ulcer of other part of right foot with fat layer exposed: Secondary | ICD-10-CM | POA: Insufficient documentation

## 2022-02-03 DIAGNOSIS — L97312 Non-pressure chronic ulcer of right ankle with fat layer exposed: Secondary | ICD-10-CM | POA: Insufficient documentation

## 2022-02-04 DIAGNOSIS — L97919 Non-pressure chronic ulcer of unspecified part of right lower leg with unspecified severity: Secondary | ICD-10-CM | POA: Diagnosis not present

## 2022-02-05 NOTE — Progress Notes (Signed)
BLAYDE, BACIGALUPI ([id number]) [id number] Page 1 of 14 Visit Report for 02/03/2022 Allergy List Details Patient Name: Date of Service: [name], [name] 02/03/2022 8:00 A M Medical Record Number: [id number] Patient Account Number: [id number] Date of Birth/Sex: Treating RN: 12-18-1960 (61 y.o. [name] Primary Care [name]: [name] HN Other Clinician: Referring [name]: Treating [name]/Extender: [name], [name]: 0 Allergies Active Allergies No Known Allergies Allergy Notes Electronic Signature(s) Signed: 02/03/2022 5:09:23 PM By: Marvin Pilling RN, BSN Entered By: [name] on 02/02/2022 16:10:13 -------------------------------------------------------------------------------- Arrival Information Details Patient Name: Date of Service: Marvin Atlas, PA UL J. 02/03/2022 8:00 A M Medical Record Number: [id number] Patient Account Number: [id number] Date of Birth/Sex: Treating RN: Jun 26, 1960 (61 y.o. [name], [name] Primary Care Edona Schreffler: [name] HN Other Clinician: Referring Jamieon Lannen: Treating [name]/Extender: [name]: 0 Visit Information Patient Arrived: Ambulatory Arrival Time: 08:12 Accompanied By: self Transfer Assistance: None Patient Identification Verified: Yes Secondary Verification Process Completed: Yes Patient Requires Transmission-Based Precautions: No Patient Has Alerts: No History Since Last Visit Added or deleted any medications: No Any new allergies or adverse reactions: No Had a fall or experienced change in activities of daily living that may affect risk of falls: No Signs or symptoms of abuse/neglect since last visito No Hospitalized since last visit: No Implantable device outside of the clinic excluding cellular tissue based products placed in the center since last visit: No Electronic Signature(s) Signed: 02/05/2022 4:44:37  PM By: Marvin Hammock RN Entered By: [name] on 02/03/2022 08:12:36 [name] ([id number]) [id number] Page 2 of 14 -------------------------------------------------------------------------------- Clinic Level of Care Assessment Details Patient Name: Date of Service: [name], [place] Marvin J. 02/03/2022 8:00 A M Medical Record Number: [id number] Patient Account Number: [id number] Date of Birth/Sex: Treating RN: 1960/05/07 (61 y.o. [name], [name] Primary Care Elanora Quin: [name] HN Other Clinician: Referring [name]: Treating [name]/Extender: [name], [name]: 0 Clinic Level of Care Assessment Items TOOL 3 Quantity Score X- 1 0 Use when EandM and Procedure is performed on FOLLOW-UP visit ASSESSMENTS - Nursing Assessment / Reassessment X- 1 10 Reassessment of Co-morbidities (includes updates in patient status) X- 1 5 Reassessment of Adherence to [name] Plan ASSESSMENTS - Wound and Skin Assessment / Reassessment '[]'$  - Points for Wound Assessment can only be taken for a new wound of unknown or different etiology and a procedure is 0 NOT performed to that wound '[]'$  - 0 Simple Wound Assessment / Reassessment - one wound X- 3 5 Complex Wound Assessment / Reassessment - multiple wounds '[]'$  - 0 Dermatologic / Skin Assessment (not related to wound area) ASSESSMENTS - Focused Assessment X- 1 5 Circumferential Edema Measurements - multi extremities '[]'$  - 0 Nutritional Assessment / Counseling / Intervention '[]'$  - 0 Lower Extremity Assessment (monofilament, tuning fork, pulses) '[]'$  - 0 Peripheral Arterial Disease Assessment (using hand held doppler) ASSESSMENTS - Ostomy and/or Continence Assessment and Care '[]'$  - 0 Incontinence Assessment and Management '[]'$  - 0 Ostomy Care Assessment and Management (repouching, etc.) PROCESS - Coordination of Care '[]'$  - Points for Discharge Coordination can only be taken  for a new wound of unknown or different etiology and a procedure 0 is NOT performed to that wound '[]'$  - 0 Simple Patient / Family Education for ongoing care X- 1 20 Complex (extensive) Patient / Family Education for ongoing care X- 1 10 Staff obtains Consents,  Records, T Results / Process Orders est '[]'$  - 0 Staff telephones HHA, Nursing Homes / Clarify orders / etc '[]'$  - 0 Routine Transfer to another Facility (non-emergent condition) '[]'$  - 0 Routine Hospital Admission (non-emergent condition) X- 1 15 New Admissions / Biomedical engineer / Ordering NPWT Apligraf, etc. , '[]'$  - 0 Emergency Hospital Admission (emergent condition) '[]'$  - 0 Simple Discharge Coordination X- 1 15 Complex (extensive) Discharge Coordination PROCESS - Special Needs '[]'$  - 0 Pediatric / Minor Patient Management '[]'$  - 0 Isolation Patient Management '[]'$  - 0 Hearing / Language / Visual special needs '[]'$  - 0 Assessment of Community assistance (transportation, D/C planning, etc.) '[]'$  - 0 Additional assistance / Altered mentation '[]'$  - 0 Support Surface(s) Assessment (bed, cushion, seat, etc.) INTERVENTIONS - Wound Cleansing / Measurement [name], [name] ([id number]) [id number] Page 3 of 14 '[]'$  - Points for Wound Cleaning / Measurement, Wound Dressing, Specimen Collection and Specimen taken to lab can only 0 be taken for a new wound of unknown or different etiology and a procedure is NOT performed to that wound '[]'$  - 0 Simple Wound Cleansing - one wound X- 3 5 Complex Wound Cleansing - multiple wounds X- 1 5 Wound Imaging (photographs - any number of wounds) '[]'$  - 0 Wound Tracing (instead of photographs) '[]'$  - 0 Simple Wound Measurement - one wound X- 3 5 Complex Wound Measurement - multiple wounds INTERVENTIONS - Wound Dressings '[]'$  - 0 Small Wound Dressing one or multiple wounds X- 3 15 Medium Wound Dressing one or multiple wounds '[]'$  - 0 Large Wound Dressing one or multiple  wounds INTERVENTIONS - Miscellaneous '[]'$  - 0 External ear exam '[]'$  - 0 Specimen Collection (cultures, biopsies, blood, body fluids, etc.) '[]'$  - 0 Specimen(s) / Culture(s) sent or taken to Lab for analysis '[]'$  - 0 Patient Transfer (multiple staff / Civil Service fast streamer / Similar devices) '[]'$  - 0 Simple Staple / Suture removal (25 or less) '[]'$  - 0 Complex Staple / Suture removal (26 or more) '[]'$  - 0 Hypo / Hyperglycemic Management (close monitor of Blood Glucose) X- 1 15 Ankle / Brachial Index (ABI) - do not check if billed separately X- 1 5 Vital Signs Has the patient been seen at the hospital within the last three years: Yes Total Score: 195 Level Of Care: New/Established - Level 5 Electronic Signature(s) Signed: 02/05/2022 4:44:37 PM By: Marvin Hammock RN Entered By: [name] on 02/03/2022 10:27:34 -------------------------------------------------------------------------------- Compression Therapy Details Patient Name: Date of Service: Marvin Atlas, PA UL J. 02/03/2022 8:00 A M Medical Record Number: [id number] Patient Account Number: [id number] Date of Birth/Sex: Treating RN: [birth year] (61 y.o. Erie Noe Primary Care Pollyann Roa: [name] HN Other Clinician: Referring Aayden Cefalu: Treating Lonny Eisen/Extender: [name], [name]: 0 Compression Therapy Performed for Wound Assessment: Wound #5 Right,Circumferential Lower Leg Performed By: Clinician Marvin Hammock, RN Compression Type: Four Layer Post Procedure Diagnosis Same as Pre-procedure Electronic Signature(s) Signed: 02/05/2022 4:44:37 PM By: Marvin Hammock RN Entered By: [name] on 02/03/2022 10:25:52 [name] ([id number]) [id number] Page 4 of 14 -------------------------------------------------------------------------------- Compression Therapy Details Patient Name: Date of Service: [name], [name] 02/03/2022 8:00 A M Medical  Record Number: [id number] Patient Account Number: [id number] Date of Birth/Sex: Treating RN: [birth year] (61 y.o. [name], [name] Primary Care Mckinnley Cottier: [name] HN Other Clinician: Referring Darshay Deupree: Treating Jemarcus Dougal/Extender: [name], [name]: 0 Compression Therapy Performed for Wound Assessment: Wound #6 Right T -  Web between 1st and 2nd oe Performed By: Clinician Marvin Hammock, RN Compression Type: Four Layer Post Procedure Diagnosis Same as Pre-procedure Electronic Signature(s) Signed: 02/05/2022 4:44:37 PM By: Marvin Hammock RN Entered By: [name] on 02/03/2022 10:25:52 -------------------------------------------------------------------------------- Compression Therapy Details Patient Name: Date of Service: Marvin Atlas, PA UL J. 02/03/2022 8:00 A M Medical Record Number: [id number] Patient Account Number: [id number] Date of Birth/Sex: Treating RN: 05/04/1960 (61 y.o. [name], [name] Primary Care Maison Kestenbaum: [name] HN Other Clinician: Referring Dravon Nott: Treating Rya Rausch/Extender: [name], [name]: 0 Compression Therapy Performed for Wound Assessment: Wound #7 Right,Lateral Ankle Performed By: Clinician Marvin Hammock, RN Compression Type: Four Layer Post Procedure Diagnosis Same as Pre-procedure Electronic Signature(s) Signed: 02/05/2022 4:44:37 PM By: Marvin Hammock RN Entered By: [name] on 02/03/2022 10:25:52 -------------------------------------------------------------------------------- Encounter Discharge Information Details Patient Name: Date of Service: Marvin Atlas, PA UL J. 02/03/2022 8:00 A M Medical Record Number: [id number] Patient Account Number: [id number] Date of Birth/Sex: Treating RN: [birth year] (62 y.o. [name], [name] Primary Care Jahid Weida: [name] HN Other Clinician: Referring Zylie Mumaw: Treating Devlin Brink/Extender:  [name], [name]: 0 Encounter Discharge Information Items Post Procedure Vitals Discharge Condition: Stable Temperature (F): 98.7 Ambulatory Status: Ambulatory Pulse (bpm): 74 Discharge Destination: Home Respiratory Rate (breaths/min): [address] ([id number]) [id number] Page 5 of 14 Transportation: Private Auto Blood Pressure (mmHg): 140/74 Accompanied By: self Schedule Follow-up Appointment: Yes Clinical Summary of Care: Patient Declined Electronic Signature(s) Signed: 02/05/2022 4:44:37 PM By: Marvin Hammock RN Entered By: [name] on 02/03/2022 10:28:12 -------------------------------------------------------------------------------- Lower Extremity Assessment Details Patient Name: Date of Service: Marvin Atlas, PA UL J. 02/03/2022 8:00 A M Medical Record Number: [id number] Patient Account Number: [id number] Date of Birth/Sex: Treating RN: 1960/12/17 (61 y.o. [name], [name] Primary Care Jarvis Knodel: [name] HN Other Clinician: Referring Saphira Lahmann: Treating Zenia Guest/Extender: [name], [name]: 0 Edema Assessment Assessed: [Left: No] [Right: Yes] Edema: [Left: Ye] [Right: s] Calf Left: Right: Point of Measurement: 42.5 cm From Medial Instep 42.5 cm Ankle Left: Right: Point of Measurement: 11.5 cm From Medial Instep 27.5 cm Knee To Floor Left: Right: From Medial Instep 46 cm Vascular Assessment Pulses: Dorsalis Pedis Palpable: [Right:Yes] Posterior Tibial Palpable: [Right:Yes] Blood Pressure: Brachial: [Right:124] Ankle: [Right:Dorsalis Pedis: 170 1.37] Electronic Signature(s) Signed: 02/05/2022 4:44:37 PM By: Marvin Hammock RN Entered By: [name] on 02/03/2022 08:54:00 -------------------------------------------------------------------------------- Multi Wound Chart Details Patient Name: Date of Service: Marvin Atlas, PA UL J.  02/03/2022 8:00 A M Medical Record Number: [id number] Patient Account Number: [id number] Date of Birth/Sex: Treating RN: [birth year] (61 y.o. [name], [name] ([id number]) [id number] Page 6 of 14 Primary Care Jariana Shumard: [name] HN Other Clinician: Referring Marcoantonio Legault: Treating Travion Ke/Extender: [name], [name]: 0 Vital Signs Height(in): Pulse(bpm): 28 Weight(lbs): Blood Pressure(mmHg): 124/74 Body Mass Index(BMI): Temperature(F): 98.7 Respiratory Rate(breaths/min): 17 [5:Photos:] Right, Circumferential Lower Leg Right T - Web between 1st and 2nd oe Right, Lateral Ankle Wound Location: Trauma Gradually Appeared Gradually Appeared Wounding Event: Venous Leg Ulcer Lymphedema Venous Leg Ulcer Primary Etiology: Anemia, Sleep Apnea, Congestive Anemia, Sleep Apnea, Congestive Anemia, Sleep Apnea, Congestive Comorbid History: Heart Failure, Coronary Artery Heart Failure, Coronary Artery Heart Failure, Coronary Artery Disease, Peripheral Venous Disease, Disease, Peripheral Venous Disease, Disease, Peripheral Venous Disease, Osteoarthritis Osteoarthritis Osteoarthritis 02/03/2022 01/11/2022 01/11/2022 Date Acquired: 0 0 0 Weeks of [name]: Open Open Open Wound Status:  No No No Wound Recurrence: Yes No No Clustered Wound: 3 N/A N/A Clustered Quantity: 8x14x0.2 4x2x0.2 1.8x1.5x0.2 Measurements L x W x D (cm) 87.965 6.283 2.121 A (cm) : rea 17.593 1.257 0.424 Volume (cm) : Full Thickness With Exposed Support Full Thickness With Exposed Support Full Thickness With Exposed Support Classification: Structures Structures Structures Medium Medium Medium Exudate Amount: Serosanguineous Serosanguineous Serosanguineous Exudate Type: red, brown red, brown red, brown Exudate Color: No Yes No Foul Odor A Cleansing: fter N/A No N/A Odor Anticipated Due to Product Use: Distinct, outline attached Distinct,  outline attached Distinct, outline attached Wound Margin: Medium (34-66%) Small (1-33%) Medium (34-66%) Granulation A mount: Red, Pink Red, Pink Red, Pink Granulation Quality: Medium (34-66%) Large (67-100%) Medium (34-66%) Necrotic Amount: Fat Layer (Subcutaneous Tissue): Yes Fat Layer (Subcutaneous Tissue): Yes Fat Layer (Subcutaneous Tissue): Yes Exposed Structures: Fascia: No Fascia: No Fascia: No Tendon: No Tendon: No Tendon: No Muscle: No Muscle: No Muscle: No Joint: No Joint: No Joint: No Bone: No Bone: No Bone: No None None None Epithelialization: Debridement - Excisional Debridement - Excisional Debridement - Excisional Debridement: Pre-procedure Verification/Time Out 08:45 08:45 08:45 Taken: Lidocaine Lidocaine Lidocaine Pain Control: Subcutaneous, Slough Subcutaneous, Slough Subcutaneous, Slough Tissue Debrided: Skin/Subcutaneous Tissue Skin/Subcutaneous Tissue Skin/Subcutaneous Tissue Level: 112 8 2.7 Debridement A (sq cm): rea Curette Curette Curette Instrument: Minimum Minimum Minimum Bleeding: Pressure Pressure Pressure Hemostasis A chieved: 0 0 0 Procedural Pain: 0 0 0 Post Procedural Pain: Procedure was tolerated well Procedure was tolerated well Procedure was tolerated well Debridement [name] Response: 8x14x0.2 4x2x0.2 1.8x1.5x0.2 Post Debridement Measurements L x W x D (cm) 17.593 1.257 0.424 Post Debridement Volume: (cm) Excoriation: No Excoriation: No Excoriation: No Periwound Skin Texture: Induration: No Induration: No Induration: No Callus: No Callus: No Callus: No Crepitus: No Crepitus: No Crepitus: No Rash: No Rash: No Rash: No Scarring: No Scarring: No Scarring: No Maceration: No Maceration: No Maceration: No Periwound Skin Moisture: Dry/Scaly: No Dry/Scaly: No Dry/Scaly: No Hemosiderin Staining: Yes Atrophie Blanche: No Atrophie Blanche: No [address]TREJON, [name] ([id number])  [id number] Page 7 of 14 Atrophie Blanche: No Cyanosis: No Cyanosis: No Cyanosis: No Ecchymosis: No Ecchymosis: No Ecchymosis: No Erythema: No Erythema: No Erythema: No Hemosiderin Staining: No Hemosiderin Staining: No Mottled: No Mottled: No Mottled: No Pallor: No Pallor: No Pallor: No Rubor: No Rubor: No Rubor: No No Abnormality No Abnormality No Abnormality Temperature: Yes Yes Yes Tenderness on Palpation: Debridement Debridement Debridement Procedures Performed: [name] Notes Electronic Signature(s) Signed: 02/03/2022 8:50:57 AM By: Fredirick Maudlin MD FACS Entered By: Fredirick Maudlin on 02/03/2022 08:50:57 -------------------------------------------------------------------------------- Multi-Disciplinary Care Plan Details Patient Name: Date of Service: Marvin Atlas, PA UL J. 02/03/2022 8:00 A M Medical Record Number: [id number] Patient Account Number: [id number] Date of Birth/Sex: Treating RN: Jan 17, 1961 (61 y.o. [name], [name] Primary Care Raevyn Sokol: [name] HN Other Clinician: Referring Finch Costanzo: Treating Ephram Kornegay/Extender: [name], [name]: 0 Active Inactive Orientation to the Wound Care Program Nursing Diagnoses: Knowledge deficit related to the wound healing center program Goals: Patient/caregiver will verbalize understanding of the [facility] Program Date Initiated: 02/03/2022 Target Resolution Date: 02/13/2022 Goal Status: Active Interventions: Provide education on orientation to the wound center Notes: Venous Leg Ulcer Nursing Diagnoses: Actual venous Insuffiency (use after diagnosis is confirmed) Knowledge deficit related to disease process and management Potential for venous Insuffiency (use before diagnosis confirmed) Goals: Non-invasive venous studies are completed as ordered Date Initiated: 02/03/2022 Target Resolution Date: 02/13/2022 Goal Status:  Active Patient will maintain optimal edema control Date Initiated: 02/03/2022 Target Resolution Date: 02/14/2022 Goal Status: Active Patient/caregiver will verbalize understanding of disease process and disease management Date Initiated: 02/03/2022 Target Resolution Date: 02/13/2022 Goal Status: Active Interventions: Assess peripheral edema status every visit. Compression as ordered Provide education on venous insufficiency [name], [name] ([id number]) [id number] Page 8 of 14 Notes: Wound/Skin Impairment Nursing Diagnoses: Impaired tissue integrity Knowledge deficit related to ulceration/compromised skin integrity Goals: Patient will have a decrease in wound volume by X% from date: (specify in notes) Date Initiated: 02/03/2022 Target Resolution Date: 02/12/2022 Goal Status: Active Patient/caregiver will verbalize understanding of skin care regimen Date Initiated: 02/03/2022 Target Resolution Date: 02/13/2022 Goal Status: Active Ulcer/skin breakdown will have a volume reduction of 30% by week 4 Date Initiated: 02/03/2022 Target Resolution Date: 02/14/2022 Goal Status: Active Interventions: Assess patient/caregiver ability to obtain necessary supplies Assess patient/caregiver ability to perform ulcer/skin care regimen upon admission and as needed Assess ulceration(s) every visit Notes: Electronic Signature(s) Signed: 02/05/2022 4:44:37 PM By: Marvin Hammock RN Entered By: [name] on 02/03/2022 10:26:24 -------------------------------------------------------------------------------- Pain Assessment Details Patient Name: Date of Service: Marvin Atlas, PA UL J. 02/03/2022 8:00 A M Medical Record Number: [id number] Patient Account Number: [id number] Date of Birth/Sex: Treating RN: 06-Sep-1960 (61 y.o. [name], [name] Primary Care Nedra Mcinnis: [name] HN Other Clinician: Referring Amarie Tarte: Treating Osher Oettinger/Extender: [name], [name]: 0 Active Problems Location of Pain Severity and Description of Pain Patient Has Paino Yes Site Locations Pain Location: Generalized Pain, Pain in Ulcers With Dressing Change: Yes Duration of the Pain. Constant / Intermittento Intermittent Rate the pain. Current Pain Level: 7 Worst Pain Level: 10 Least Pain Level: 0 Tolerable Pain Level: 7 Character of Pain Describe the Pain: Aching Pain Management and Medication Current Pain Management: [name], [name] ([id number]) [id number] Page 9 of 14 Medication: No Cold Application: No Rest: No Massage: No Activity: No T.E.N.S.: No Heat Application: No Leg drop or elevation: No Is the Current Pain Management Adequate: Adequate How does your wound impact your activities of daily livingo Sleep: No Bathing: No Appetite: No Relationship With Others: No Bladder Continence: No Emotions: No Bowel Continence: No Work: No Toileting: No Drive: No Dressing: No Hobbies: No Electronic Signature(s) Signed: 02/05/2022 4:44:37 PM By: Marvin Hammock RN Entered By: [name] on 02/03/2022 08:13:06 -------------------------------------------------------------------------------- Patient/Caregiver Education Details Patient Name: Date of Service: Marvin Atlas, PA UL J. 10/24/2023andnbsp8:00 A M Medical Record Number: [id number] Patient Account Number: [id number] Date of Birth/Gender: Treating RN: 08/23/1960 (61 y.o. Erie Noe Primary Care Physician: [name] HN Other Clinician: Referring Physician: Treating Physician/Extender: Osborne Oman HN Weeks in [name]: 0 Education Assessment Education Provided To: Patient Education Topics Provided Venous: Methods: Explain/Verbal Responses: State content correctly Electronic Signature(s) Signed: 02/05/2022 4:44:37 PM By: Marvin Hammock RN Entered By: [name] on 02/03/2022  10:26:32 -------------------------------------------------------------------------------- Wound Assessment Details Patient Name: Date of Service: Marvin Atlas, PA UL J. 02/03/2022 8:00 A M Medical Record Number: [id number] Patient Account Number: [id number] Date of Birth/Sex: Treating RN: July 20, 1960 (61 y.o. [name], [name] Primary Care Sharah Finnell: [name] HN Other Clinician: Referring Bethzy Hauck: Treating Isabel Ardila/Extender: [name], [name]: 0 Wound Status Wound Number: 5 Primary Venous Leg Ulcer Etiology: Wound Location: Right, Circumferential Lower Leg Wound Open Wounding Event: Trauma Status: Date Acquired: 02/03/2022 [name], [name] ([id number]) [id number] Page 10 of 14 Date Acquired: 02/03/2022 Comorbid Anemia, Sleep Apnea,  Congestive Heart Failure, Coronary Artery Weeks Of [name]: 0 History: Disease, Peripheral Venous Disease, Osteoarthritis Clustered Wound: Yes Photos Wound Measurements Length: (cm) 8 Width: (cm) 14 Depth: (cm) 0.2 Clustered Quantity: 3 Area: (cm) 87.965 Volume: (cm) 17.593 % Reduction in Area: % Reduction in Volume: Epithelialization: None Tunneling: No Undermining: No Wound Description Classification: Full Thickness With Exposed Support Structures Wound Margin: Distinct, outline attached Exudate Amount: Medium Exudate Type: Serosanguineous Exudate Color: red, brown Foul Odor After Cleansing: No Slough/Fibrino Yes Wound Bed Granulation Amount: Medium (34-66%) Exposed Structure Granulation Quality: Red, Pink Fascia Exposed: No Necrotic Amount: Medium (34-66%) Fat Layer (Subcutaneous Tissue) Exposed: Yes Necrotic Quality: Adherent Slough Tendon Exposed: No Muscle Exposed: No Joint Exposed: No Bone Exposed: No Periwound Skin Texture Texture Color No Abnormalities Noted: No No Abnormalities Noted: No Callus: No Atrophie Blanche: No Crepitus: No Cyanosis:  No Excoriation: No Ecchymosis: No Induration: No Erythema: No Rash: No Hemosiderin Staining: Yes Scarring: No Mottled: No Pallor: No Moisture Rubor: No No Abnormalities Noted: No Dry / Scaly: No Temperature / Pain Maceration: No Temperature: No Abnormality Tenderness on Palpation: Yes [name] Notes Wound #5 (Lower Leg) Wound Laterality: Right, Circumferential Cleanser Soap and Water Discharge Instruction: May shower and wash wound with dial antibacterial soap and water prior to dressing change. Wound Cleanser Discharge Instruction: Cleanse the wound with wound cleanser prior to applying a clean dressing using gauze sponges, not tissue or cotton balls. Peri-Wound Care Topical Primary Dressing KerraCel Ag Gelling Fiber Dressing, 4x5 in (silver alginate) Discharge Instruction: Apply silver alginate to wound bed as instructed TIGE, MEAS ([id number]) [id number] Page 11 of 14 Secondary Dressing ABD Pad, 5x9 Discharge Instruction: Apply over primary dressing as directed. Woven Gauze Sponge, Non-Sterile 4x4 in Discharge Instruction: Apply over primary dressing as directed. Secured With Compression Wrap FourPress (4 layer compression wrap) Discharge Instruction: Apply four layer compression as directed. May also use Miliken CoFlex 2 layer compression system as alternative. Compression Stockings Add-Ons Electronic Signature(s) Signed: 02/05/2022 4:44:37 PM By: Marvin Hammock RN Entered By: [name] on 02/03/2022 08:29:19 -------------------------------------------------------------------------------- Wound Assessment Details Patient Name: Date of Service: Marvin Atlas, PA UL J. 02/03/2022 8:00 A M Medical Record Number: [id number] Patient Account Number: [id number] Date of Birth/Sex: Treating RN: 1960-08-20 (61 y.o. [name], [name] Primary Care Sherrod Toothman: [name] HN Other Clinician: Referring Alyss Granato: Treating  Lajada Janes/Extender: [name], [name]: 0 Wound Status Wound Number: 6 Primary Lymphedema Etiology: Wound Location: Right T - Web between 1st and 2nd oe Wound Open Wounding Event: Gradually Appeared Status: Date Acquired: 01/11/2022 Comorbid Anemia, Sleep Apnea, Congestive Heart Failure, Coronary Artery Weeks Of [name]: 0 History: Disease, Peripheral Venous Disease, Osteoarthritis Clustered Wound: No Photos Wound Measurements Length: (cm) 4 Width: (cm) 2 Depth: (cm) 0.2 Area: (cm) 6.283 Volume: (cm) 1.257 % Reduction in Area: % Reduction in Volume: Epithelialization: None Tunneling: No Undermining: No Wound Description Classification: Full Thickness With Exposed Support Structures Wound Margin: Distinct, outline attached Exudate Amount: Medium Exudate Type: Serosanguineous Exudate Color: red, brown Foul Odor After Cleansing: Yes Due to Product Use: No Slough/Fibrino Yes Wound Bed [name], [name] ([id number]) [id number] Page 12 of 14 Granulation Amount: Small (1-33%) Exposed Structure Granulation Quality: Red, Pink Fascia Exposed: No Necrotic Amount: Large (67-100%) Fat Layer (Subcutaneous Tissue) Exposed: Yes Necrotic Quality: Adherent Slough Tendon Exposed: No Muscle Exposed: No Joint Exposed: No Bone Exposed: No Periwound Skin Texture Texture Color No Abnormalities Noted: No No Abnormalities Noted: No Callus: No Atrophie Blanche:  No Crepitus: No Cyanosis: No Excoriation: No Ecchymosis: No Induration: No Erythema: No Rash: No Hemosiderin Staining: No Scarring: No Mottled: No Pallor: No Moisture Rubor: No No Abnormalities Noted: No Dry / Scaly: No Temperature / Pain Maceration: No Temperature: No Abnormality Tenderness on Palpation: Yes [name] Notes Wound #6 (Toe - Web between 1st and 2nd) Wound Laterality: Right Cleanser Wound Cleanser Discharge Instruction: Cleanse the wound  with wound cleanser prior to applying a clean dressing using gauze sponges, not tissue or cotton balls. Peri-Wound Care Topical Ketoconazole Cream 2% Discharge Instruction: Apply Ketoconazole as directed. Use over the counter antifungal powder at home Primary Dressing KerraCel Ag Gelling Fiber Dressing, 4x5 in (silver alginate) Discharge Instruction: Apply silver alginate to wound bed as instructed Secondary Dressing Woven Gauze Sponge, Non-Sterile 4x4 in Discharge Instruction: Apply over primary dressing as directed. Secured With Conforming Stretch Gauze Bandage, Sterile 2x75 (in/in) Discharge Instruction: Secure with stretch gauze as directed. 53M Medipore H Soft Cloth Surgical T ape, 4 x 10 (in/yd) Discharge Instruction: Secure with tape as directed. Stretch Net Size 2, 10 (yds) Compression Wrap Compression Stockings Add-Ons Electronic Signature(s) Signed: 02/05/2022 4:44:37 PM By: Marvin Hammock RN Entered By: [name] on 02/03/2022 08:29:58 -------------------------------------------------------------------------------- Wound Assessment Details Patient Name: Date of Service: Marvin Atlas, PA UL J. 02/03/2022 8:00 A M Medical Record Number: [id number] Patient Account Number: [id number] Date of Birth/Sex: Treating RN: 1961-02-28 (61 y.o. [name], [name] ([id number]) [id number] Page 13 of 14 Primary Care Waymon Laser: [name] HN Other Clinician: Referring Aquarius Latouche: Treating Karlie Aung/Extender: [name], [name]: 0 Wound Status Wound Number: 7 Primary Venous Leg Ulcer Etiology: Wound Location: Right, Lateral Ankle Wound Open Wounding Event: Gradually Appeared Status: Date Acquired: 01/11/2022 Comorbid Anemia, Sleep Apnea, Congestive Heart Failure, Coronary Artery Weeks Of [name]: 0 History: Disease, Peripheral Venous Disease, Osteoarthritis Clustered Wound: No Photos Wound  Measurements Length: (cm) 1.8 Width: (cm) 1.5 Depth: (cm) 0.2 Area: (cm) 2.121 Volume: (cm) 0.424 % Reduction in Area: % Reduction in Volume: Epithelialization: None Tunneling: No Undermining: No Wound Description Classification: Full Thickness With Exposed Support Structures Wound Margin: Distinct, outline attached Exudate Amount: Medium Exudate Type: Serosanguineous Exudate Color: red, brown Foul Odor After Cleansing: No Slough/Fibrino Yes Wound Bed Granulation Amount: Medium (34-66%) Exposed Structure Granulation Quality: Red, Pink Fascia Exposed: No Necrotic Amount: Medium (34-66%) Fat Layer (Subcutaneous Tissue) Exposed: Yes Necrotic Quality: Adherent Slough Tendon Exposed: No Muscle Exposed: No Joint Exposed: No Bone Exposed: No Periwound Skin Texture Texture Color No Abnormalities Noted: No No Abnormalities Noted: No Callus: No Atrophie Blanche: No Crepitus: No Cyanosis: No Excoriation: No Ecchymosis: No Induration: No Erythema: No Rash: No Hemosiderin Staining: No Scarring: No Mottled: No Pallor: No Moisture Rubor: No No Abnormalities Noted: No Dry / Scaly: No Temperature / Pain Maceration: No Temperature: No Abnormality Tenderness on Palpation: Yes [name] Notes Wound #7 (Ankle) Wound Laterality: Right, Lateral Cleanser Soap and Water Discharge Instruction: May shower and wash wound with dial antibacterial soap and water prior to dressing change. Wound Cleanser Discharge Instruction: Cleanse the wound with wound cleanser prior to applying a clean dressing using gauze sponges, not tissue or cotton balls. EULICE, RUTLEDGE ([id number]) [id number] Page 14 of 14 Peri-Wound Care Topical Primary Dressing KerraCel Ag Gelling Fiber Dressing, 4x5 in (silver alginate) Discharge Instruction: Apply silver alginate to wound bed as instructed Secondary Dressing ABD Pad, 5x9 Discharge Instruction: Apply over primary dressing as  directed. Woven Gauze Sponge, Non-Sterile 4x4 in  Discharge Instruction: Apply over primary dressing as directed. Secured With Compression Wrap FourPress (4 layer compression wrap) Discharge Instruction: Apply four layer compression as directed. May also use Miliken CoFlex 2 layer compression system as alternative. Compression Stockings Add-Ons Electronic Signature(s) Signed: 02/05/2022 4:44:37 PM By: Marvin Hammock RN Entered By: [name] on 02/03/2022 08:29:36 -------------------------------------------------------------------------------- Vitals Details Patient Name: Date of Service: Marvin Atlas, PA UL J. 02/03/2022 8:00 A M Medical Record Number: [id number] Patient Account Number: [id number] Date of Birth/Sex: Treating RN: [birth year] (61 y.o. [name], [name] Primary Care Arine Foley: [name] HN Other Clinician: Referring Curtistine Pettitt: Treating Aries Kasa/Extender: [name], [name]: 0 Vital Signs Time Taken: 08:40 Temperature (F): 98.7 Pulse (bpm): 57 Respiratory Rate (breaths/min): 17 Blood Pressure (mmHg): 124/74 Reference Range: 80 - 120 mg / dl Electronic Signature(s) Signed: 02/05/2022 4:44:37 PM By: Marvin Hammock RN Entered By: [name] on 02/03/2022 08:44:37

## 2022-02-05 NOTE — Progress Notes (Signed)
LAVI, SHEEHAN ([id number]) [id number] Page 1 of 13 Visit Report for 02/03/2022 Chief Complaint Document Details Patient Name: Date of Service: [name], [name] 02/03/2022 8:00 A M Medical Record Number: [id number] Patient Account Number: [id number] Date of Birth/Sex: Treating RN: 08/05/1960 (61 y.o. M) Primary Care Provider: [name] Other Clinician: Referring Provider: Treating Provider/Extender: Osborne Oman HN Weeks in Treatment: 0 Information Obtained from: Patient Chief Complaint 05/20/2020; patient is here for review of wounds on his bilateral lower legs in the setting of severe chronic venous insufficiency with lymphedema 02/03/2022: returns to clinic today with new wounds on RLE Electronic Signature(s) Signed: 02/03/2022 8:51:31 AM By: Fredirick Maudlin MD FACS Entered By: Fredirick Maudlin on 02/03/2022 08:51:30 -------------------------------------------------------------------------------- Debridement Details Patient Name: Date of Service: Marvin Atlas, [name] UL J. 02/03/2022 8:00 A M Medical Record Number: [id number] Patient Account Number: [id number] Date of Birth/Sex: Treating RN: 03-29-1961 (61 y.o. [name], [name] Primary Care Provider: [name] Other Clinician: Referring Provider: Treating Provider/Extender: Osborne Oman HN Weeks in Treatment: 0 Debridement Performed for Assessment: Wound #5 Right,Circumferential Lower Leg Performed By: Physician Fredirick Maudlin, MD Debridement Type: Debridement Severity of Tissue Pre Debridement: Fat layer exposed Level of Consciousness (Pre-procedure): Awake and Alert Pre-procedure Verification/Time Out Yes - 08:45 Taken: Start Time: 08:45 Pain Control: Lidocaine T Area Debrided (L x W): otal 8 (cm) x 14 (cm) = 112 (cm) Tissue and other material debrided: Viable, Non-Viable, Slough, Subcutaneous, Slough Level: Skin/Subcutaneous Tissue Debridement  Description: Excisional Instrument: Curette Bleeding: Minimum Hemostasis Achieved: Pressure End Time: 08:45 Procedural Pain: 0 Post Procedural Pain: 0 Response to Treatment: Procedure was tolerated well Level of Consciousness (Post- Awake and Alert procedure): Post Debridement Measurements of Total Wound Length: (cm) 8 Width: (cm) 14 Depth: (cm) 0.2 Volume: (cm) 17.593 [name], [name] ([id number]) [id number] Page 2 of 13 Character of Wound/Ulcer Post Debridement: Improved Severity of Tissue Post Debridement: Fat layer exposed Post Procedure Diagnosis Same as Pre-procedure Electronic Signature(s) Signed: 02/03/2022 11:09:58 AM By: Fredirick Maudlin MD FACS Signed: 02/05/2022 4:44:37 PM By: Rhae Hammock RN Entered By: Rhae Hammock on 02/03/2022 08:43:03 -------------------------------------------------------------------------------- Debridement Details Patient Name: Date of Service: Marvin Atlas, [name] UL J. 02/03/2022 8:00 A M Medical Record Number: [id number] Patient Account Number: [id number] Date of Birth/Sex: Treating RN: October 24, 1960 (61 y.o. [name], [name] Primary Care Provider: [name] Other Clinician: Referring Provider: Treating Provider/Extender: Osborne Oman HN Weeks in Treatment: 0 Debridement Performed for Assessment: Wound #7 Right,Lateral Ankle Performed By: Physician Fredirick Maudlin, MD Debridement Type: Debridement Severity of Tissue Pre Debridement: Fat layer exposed Level of Consciousness (Pre-procedure): Awake and Alert Pre-procedure Verification/Time Out Yes - 08:45 Taken: Start Time: 08:45 Pain Control: Lidocaine T Area Debrided (L x W): otal 1.8 (cm) x 1.5 (cm) = 2.7 (cm) Tissue and other material debrided: Viable, Non-Viable, Slough, Subcutaneous, Slough Level: Skin/Subcutaneous Tissue Debridement Description: Excisional Instrument: Curette Bleeding: Minimum Hemostasis Achieved:  Pressure End Time: 08:45 Procedural Pain: 0 Post Procedural Pain: 0 Response to Treatment: Procedure was tolerated well Level of Consciousness (Post- Awake and Alert procedure): Post Debridement Measurements of Total Wound Length: (cm) 1.8 Width: (cm) 1.5 Depth: (cm) 0.2 Volume: (cm) 0.424 Character of Wound/Ulcer Post Debridement: Improved Severity of Tissue Post Debridement: Fat layer exposed Post Procedure Diagnosis Same as Pre-procedure Electronic Signature(s) Signed: 02/03/2022 11:09:58 AM By: Fredirick Maudlin MD FACS Signed: 02/05/2022 4:44:37 PM By: Rhae Hammock RN Entered By: Rhae Hammock on  02/03/2022 08:44:05 [name], [name] ([id number]) [id number] Page 3 of 13 -------------------------------------------------------------------------------- Debridement Details Patient Name: Date of Service: [name], [name] 02/03/2022 8:00 A M Medical Record Number: [id number] Patient Account Number: [id number] Date of Birth/Sex: Treating RN: 15-Apr-1960 (61 y.o. [name], [name] Primary Care Provider: [name] Other Clinician: Referring Provider: Treating Provider/Extender: Osborne Oman HN Weeks in Treatment: 0 Debridement Performed for Assessment: Wound #6 Right T - Web between 1st and 2nd oe Performed By: Physician Fredirick Maudlin, MD Debridement Type: Debridement Level of Consciousness (Pre-procedure): Awake and Alert Pre-procedure Verification/Time Out Yes - 08:45 Taken: Start Time: 08:45 Pain Control: Lidocaine T Area Debrided (L x W): otal 4 (cm) x 2 (cm) = 8 (cm) Tissue and other material debrided: Viable, Non-Viable, Slough, Subcutaneous, Slough Level: Skin/Subcutaneous Tissue Debridement Description: Excisional Instrument: Curette Bleeding: Minimum Hemostasis Achieved: Pressure End Time: 08:45 Procedural Pain: 0 Post Procedural Pain: 0 Response to Treatment: Procedure was tolerated well Level  of Consciousness (Post- Awake and Alert procedure): Post Debridement Measurements of Total Wound Length: (cm) 4 Width: (cm) 2 Depth: (cm) 0.2 Volume: (cm) 1.257 Character of Wound/Ulcer Post Debridement: Improved Post Procedure Diagnosis Same as Pre-procedure Electronic Signature(s) Signed: 02/03/2022 11:09:58 AM By: Fredirick Maudlin MD FACS Signed: 02/05/2022 4:44:37 PM By: Rhae Hammock RN Entered By: Rhae Hammock on 02/03/2022 08:45:01 -------------------------------------------------------------------------------- HPI Details Patient Name: Date of Service: Marvin Atlas, [name] UL J. 02/03/2022 8:00 A M Medical Record Number: [id number] Patient Account Number: [id number] Date of Birth/Sex: Treating RN: 22-Jul-1960 (61 y.o. M) Primary Care Provider: [name] Other Clinician: Referring Provider: Treating Provider/Extender: Osborne Oman HN Weeks in Treatment: 0 History of Present Illness HPI Description: ADMISSION 05/20/2020 This is a 61 year old man who is not a diabetic however he has a long history of chronic venous insufficiency with wounds on his lower extremities. He went to the wound care center in [place] for a long period of time with recurrent wounds in these areas treated with compression wraps. He comes in today with wounds on his bilateral lower legs that he says have been there about a year on the right and about 6 months on the left. He went to see Dr. Oneida Alar on 01/17/2020 who noted that he had no history of DVT but does have a history of venous insufficiency wounds. They noted that he was considering laser ablation in October 2020 but he canceled the procedure. He had an episode of bleeding from varicose veins in 2019. He had repeated venous reflux study on 10/6 which showed no evidence of a DVT or SVT Noted that he had venous reflux in the right greater saphenous vein in the thigh and the calf on the right. Also . noted to have venous reflux  in the right femoral vein right popliteal vein and right small saphenous vein. At the time in October they wanted the wounds to heal a bit he also also had a right groin wound which is apparently closed. Not sure about the follow-up with vein and vascular. MARQUEZ, CEESAY ([id number]) [id number] Page 4 of 13 He comes in today with some light compression wraps and collagen on the wound placed by Dr. Lilia Pro who is a Education officer, environmental in [place]. He has 2 areas on the left leg 1 anteriorly and a large area posteriorly and a smaller area on the right anterior lower leg. He has marked lower extremity edema which is nonpitting. Hemosiderin deposition on both lower legs  extensively. There is dilated venules in his feet. Past medical history includes history of opioid abuse in remission, left nephrolithiasis, history of an ablation although I did not see this in Dr. Oneida Alar notes, congestive heart failure and anemia ABIs in our clinic were noncompressible bilaterally 05/27/2020; we admitted this patient to clinic last week. He has bilateral superficial venous insufficiency with bilateral wounds left greater than right. We put him in 4 layer compression with silver alginate. The he was previously cared for in the [facility] wound care clinic when it was open. His wounds are better with compression this week and there is certainly less swelling. 2/21; bilateral superficial venous insufficiency wounds. A cluster on the left lateral. He has very well-defined areas on the right anterior right and right lateral. We are using silver alginate and 4-layer compression 2/28; bilateral superficial venous insufficiency wounds in the setting of significant stage III lymphedema. He has a cluster on the left which is smaller. His area on the right anterior and right lateral also look better. 3/14 superficial bilateral venous insufficiency wounds. Much better on both sides in fact the area on the right  posterior is healed. He still has 2 small open areas on the right anterior and the left lateral 3/21; superficial bilateral venous insufficiency wounds. Everything is closed on the right side. Small area still remains on the left. We will transition him today to his right extremiteaze stocking. Still dressing the remaining wound on the left with compression 3/28; the patient's right leg remains closed in his wraparound stocking. Disappointingly today the area on the left lateral leg looks about the same. I thought this might be closed with been using silver alginate 4/4; right leg remains in his wraparound stocking.. Left lateral leg measures smaller even under illumination the wound looks good. Changed him to [facility] with additional ABD compression last week 4/11; left lateral posterior very tiny superficial wound. This should be closed by next week. Using Hydrofera Blue. 4/18; left lateral wound is totally epithelialized. He has his own external compression stocking and compression pumps. READMISSION 02/03/2022 This is a 61 year old man well-known to the wound care center. He has chronic venous insufficiency and lymphedema. He has been evaluated in the past and has documented venous reflux, but thus far, has not undergone ablation. He returns to clinic today with multiple wounds on his right lower extremity. He reports that the medial lower leg wound started when something sharp stuck into his leg about a year ago. He says he was trying to treat this at home and took a course of cephalexin that he had leftover from a previous event. It did not improve and in the past 3 to 4 weeks, it has actually gotten worse, to the point that he is unable to use his lymphedema pumps secondary to pain. He has another ulcer on his left lateral ankle and one between his first and second toes, both present for about 3 to 4 weeks, per his report. On his right medial lower leg, there are 2 ulcers with slough  accumulated. The surface underneath the slough is quite fibrous, consistent with his history of multiple lower leg ulcerations. He has a triangular wound on his right lateral ankle, similar in appearance to the others. Between his first and second toes, he has what appears to be athlete's foot, with a fungal odor and skin breakdown with eschar and slough present. Electronic Signature(s) Signed: 02/03/2022 8:55:27 AM By: Fredirick Maudlin MD FACS Entered By: Fredirick Maudlin on  02/03/2022 08:55:26 -------------------------------------------------------------------------------- Physical Exam Details Patient Name: Date of Service: [name], [name] 02/03/2022 8:00 A M Medical Record Number: [id number] Patient Account Number: [id number] Date of Birth/Sex: Treating RN: 31-Oct-1960 (61 y.o. M) Primary Care Provider: [name] Other Clinician: Referring Provider: Treating Provider/Extender: Chuck Hint, JO HN Weeks in Treatment: 0 Constitutional . Slightly bradycardic, asymptomatic.. . . No acute distress. Respiratory Normal work of breathing on room air.. Cardiovascular Skin changes consistent with longstanding lymphedema and venous insufficiency. 2-3+ pitting edema bilaterally. Notes 02/03/2022: On his right medial lower leg, there are 2 ulcers with slough accumulated. The surface underneath the slough is quite fibrous, consistent with his history of multiple lower leg ulcerations. He has a triangular wound on his right lateral ankle, similar in appearance to the others. Between his first and second toes, he has what appears to be athlete's foot, with a fungal odor and skin breakdown with eschar and slough present. Electronic Signature(s) Signed: 02/03/2022 8:56:44 AM By: Fredirick Maudlin MD FACS Entered By: Fredirick Maudlin on 02/03/2022 08:56:44 [name] ([id number]) [id number] Page 5 of  13 -------------------------------------------------------------------------------- Physician Orders Details Patient Name: Date of Service: [name], [name] 02/03/2022 8:00 A M Medical Record Number: [id number] Patient Account Number: [id number] Date of Birth/Sex: Treating RN: Jul 09, 1960 (61 y.o. [name], [name] Primary Care Provider: [name] Other Clinician: Referring Provider: Treating Provider/Extender: Chuck Hint, JO HN Weeks in Treatment: 0 Verbal / Phone Orders: No Diagnosis Coding ICD-10 Coding Code Description I87.313 Chronic venous hypertension (idiopathic) with ulcer of bilateral lower extremity L97.822 Non-pressure chronic ulcer of other part of left lower leg with fat layer exposed L97.812 Non-pressure chronic ulcer of other part of right lower leg with fat layer exposed Follow-up Appointments ppointment in 1 week. - w/ Dr. Celine Ahr and [name] # 4 (he prefers early in the morning) Return A Anesthetic (In clinic) Topical Lidocaine 5% applied to wound bed Bathing/ Shower/ Hygiene May shower with protection but do not get wound dressing(s) wet. Edema Control - Lymphedema / SCD / Other Lymphedema Pumps. Use Lymphedema pumps on leg(s) 2-3 times a day for 45-60 minutes. If wearing any wraps or hose, do not remove them. Continue exercising as instructed. Elevate legs to the level of the heart or above for 30 minutes daily and/or when sitting, a frequency of: Avoid standing for long periods of time. Wound Treatment Wound #5 - Lower Leg Wound Laterality: Right, Circumferential Cleanser: Soap and Water 1 x Per Week/7 Days Discharge Instructions: May shower and wash wound with dial antibacterial soap and water prior to dressing change. Cleanser: Wound Cleanser 1 x Per Week/7 Days Discharge Instructions: Cleanse the wound with wound cleanser prior to applying a clean dressing using gauze sponges, not tissue or cotton balls. Prim Dressing: KerraCel  Ag Gelling Fiber Dressing, 4x5 in (silver alginate) 1 x Per Week/7 Days ary Discharge Instructions: Apply silver alginate to wound bed as instructed Secondary Dressing: ABD Pad, 5x9 1 x Per Week/7 Days Discharge Instructions: Apply over primary dressing as directed. Secondary Dressing: Woven Gauze Sponge, Non-Sterile 4x4 in 1 x Per Week/7 Days Discharge Instructions: Apply over primary dressing as directed. Compression Wrap: FourPress (4 layer compression wrap) 1 x Per Week/7 Days Discharge Instructions: Apply four layer compression as directed. May also use Miliken CoFlex 2 layer compression system as alternative. Wound #6 - T - Web between 1st and 2nd oe Wound Laterality: Right Cleanser: Wound Cleanser (DME) (Generic)  1 x Per Day/15 Days Discharge Instructions: Cleanse the wound with wound cleanser prior to applying a clean dressing using gauze sponges, not tissue or cotton balls. Topical: Ketoconazole Cream 2% 1 x Per Day/15 Days Discharge Instructions: Apply Ketoconazole as directed. Use over the counter antifungal powder at home Prim Dressing: KerraCel Ag Gelling Fiber Dressing, 4x5 in (silver alginate) (DME) (Generic) 1 x Per Day/15 Days ary Discharge Instructions: Apply silver alginate to wound bed as instructed Secondary Dressing: Woven Gauze Sponge, Non-Sterile 4x4 in (DME) (Generic) 1 x Per Day/15 Days Discharge Instructions: Apply over primary dressing as directed. Secured With: Child psychotherapist, Sterile 2x75 (in/in) (DME) (Generic) 1 x Per Day/15 Days Discharge Instructions: Secure with stretch gauze as directed. [name], [name] ([id number]) [id number] Page 6 of 13 Secured With: 72M Medipore H Soft Cloth Surgical T ape, 4 x 10 (in/yd) (DME) (Generic) 1 x Per Day/15 Days Discharge Instructions: Secure with tape as directed. Secured With: [employer] Size 2, 10 (yds) (DME) (Generic) 1 x Per Day/15 Days Wound #7 - Ankle Wound Laterality:  Right, Lateral Cleanser: Soap and Water 1 x Per Week/7 Days Discharge Instructions: May shower and wash wound with dial antibacterial soap and water prior to dressing change. Cleanser: Wound Cleanser 1 x Per Week/7 Days Discharge Instructions: Cleanse the wound with wound cleanser prior to applying a clean dressing using gauze sponges, not tissue or cotton balls. Prim Dressing: KerraCel Ag Gelling Fiber Dressing, 4x5 in (silver alginate) 1 x Per Week/7 Days ary Discharge Instructions: Apply silver alginate to wound bed as instructed Secondary Dressing: ABD Pad, 5x9 1 x Per Week/7 Days Discharge Instructions: Apply over primary dressing as directed. Secondary Dressing: Woven Gauze Sponge, Non-Sterile 4x4 in 1 x Per Week/7 Days Discharge Instructions: Apply over primary dressing as directed. Compression Wrap: FourPress (4 layer compression wrap) 1 x Per Week/7 Days Discharge Instructions: Apply four layer compression as directed. May also use Miliken CoFlex 2 layer compression system as alternative. Services and Therapies Venous Studies -Bilateral Patient Medications llergies: No Known Allergies A Notifications Medication Indication Start End PRN debridements/pain10/24/2023 lidocaine DOSE topical 5 % gel - gel topical Electronic Signature(s) Signed: 02/03/2022 11:09:58 AM By: Fredirick Maudlin MD FACS Entered By: Fredirick Maudlin on 02/03/2022 08:57:14 -------------------------------------------------------------------------------- Problem List Details Patient Name: Date of Service: Marvin Atlas, [name] UL J. 02/03/2022 8:00 A M Medical Record Number: [id number] Patient Account Number: [id number] Date of Birth/Sex: Treating RN: 12/16/1960 (61 y.o. M) Primary Care Provider: [name] Other Clinician: Referring Provider: Treating Provider/Extender: Osborne Oman HN Weeks in Treatment: 0 Active Problems ICD-10 Encounter Code Description Active Date  MDM Diagnosis I87.331 Chronic venous hypertension (idiopathic) with ulcer and inflammation of right 02/03/2022 No Yes lower extremity L97.812 Non-pressure chronic ulcer of other part of right lower leg with fat layer 02/03/2022 No Yes exposed GENERAL, WEARING ([id number]) [id number] Page 7 of 13 L97.312 Non-pressure chronic ulcer of right ankle with fat layer exposed 02/03/2022 No Yes L97.512 Non-pressure chronic ulcer of other part of right foot with fat layer exposed 02/03/2022 No Yes Inactive Problems Resolved Problems Electronic Signature(s) Signed: 02/03/2022 8:49:22 AM By: Fredirick Maudlin MD FACS Previous Signature: 02/03/2022 8:22:54 AM Version By: Fredirick Maudlin MD FACS Entered By: Fredirick Maudlin on 02/03/2022 08:49:21 -------------------------------------------------------------------------------- Progress Note Details Patient Name: Date of Service: Marvin Atlas, [name] UL J. 02/03/2022 8:00 A M Medical Record Number: [id number] Patient Account Number: [id number] Date of Birth/Sex: Treating RN: 06/01/1960 (61 y.o. M) Primary Care  Provider: [name] Other Clinician: Referring Provider: Treating Provider/Extender: Osborne Oman HN Weeks in Treatment: 0 Subjective Chief Complaint Information obtained from Patient 05/20/2020; patient is here for review of wounds on his bilateral lower legs in the setting of severe chronic venous insufficiency with lymphedema 02/03/2022: returns to clinic today with new wounds on RLE History of Present Illness (HPI) ADMISSION 05/20/2020 This is a 61 year old man who is not a diabetic however he has a long history of chronic venous insufficiency with wounds on his lower extremities. He went to the wound care center in [place] for a long period of time with recurrent wounds in these areas treated with compression wraps. He comes in today with wounds on his bilateral lower legs that he says have been  there about a year on the right and about 6 months on the left. He went to see Dr. Oneida Alar on 01/17/2020 who noted that he had no history of DVT but does have a history of venous insufficiency wounds. They noted that he was considering laser ablation in October 2020 but he canceled the procedure. He had an episode of bleeding from varicose veins in 2019. He had repeated venous reflux study on 10/6 which showed no evidence of a DVT or SVT Noted that he had venous reflux in the right greater saphenous vein in the thigh and the calf on the right. Also . noted to have venous reflux in the right femoral vein right popliteal vein and right small saphenous vein. At the time in October they wanted the wounds to heal a bit he also also had a right groin wound which is apparently closed. Not sure about the follow-up with vein and vascular. He comes in today with some light compression wraps and collagen on the wound placed by Dr. Lilia Pro who is a Education officer, environmental in [place]. He has 2 areas on the left leg 1 anteriorly and a large area posteriorly and a smaller area on the right anterior lower leg. He has marked lower extremity edema which is nonpitting. Hemosiderin deposition on both lower legs extensively. There is dilated venules in his feet. Past medical history includes history of opioid abuse in remission, left nephrolithiasis, history of an ablation although I did not see this in Dr. Oneida Alar notes, congestive heart failure and anemia ABIs in our clinic were noncompressible bilaterally 05/27/2020; we admitted this patient to clinic last week. He has bilateral superficial venous insufficiency with bilateral wounds left greater than right. We put him in 4 layer compression with silver alginate. The he was previously cared for in the [facility] wound care clinic when it was open. His wounds are better with compression this week and there is certainly less swelling. 2/21; bilateral superficial venous insufficiency  wounds. A cluster on the left lateral. He has very well-defined areas on the right anterior right and right lateral. We are using silver alginate and 4-layer compression 2/28; bilateral superficial venous insufficiency wounds in the setting of significant stage III lymphedema. He has a cluster on the left which is smaller. His area on the right anterior and right lateral also look better. 3/14 superficial bilateral venous insufficiency wounds. Much better on both sides in fact the area on the right posterior is healed. He still has 2 small open areas on the right anterior and the left lateral 3/21; superficial bilateral venous insufficiency wounds. Everything is closed on the right side. Small area still remains on the left. We will transition him today to  his right extremiteaze stocking. Still dressing the remaining wound on the left with compression 3/28; the patient's right leg remains closed in his wraparound stocking. Disappointingly today the area on the left lateral leg looks about the same. I thought this might be closed with been using silver alginate 4/4; right leg remains in his wraparound stocking.. Left lateral leg measures smaller even under illumination the wound looks good. Changed him to [facility] with additional ABD compression last week 4/11; left lateral posterior very tiny superficial wound. This should be closed by next week. Using Hydrofera Blue. 4/18; left lateral wound is totally epithelialized. He has his own external compression stocking and compression pumps. [name], [name] ([id number]) [id number] Page 8 of 13 02/03/2022 This is a 61 year old man well-known to the wound care center. He has chronic venous insufficiency and lymphedema. He has been evaluated in the past and has documented venous reflux, but thus far, has not undergone ablation. He returns to clinic today with multiple wounds on his right lower extremity. He  reports that the medial lower leg wound started when something sharp stuck into his leg about a year ago. He says he was trying to treat this at home and took a course of cephalexin that he had leftover from a previous event. It did not improve and in the past 3 to 4 weeks, it has actually gotten worse, to the point that he is unable to use his lymphedema pumps secondary to pain. He has another ulcer on his left lateral ankle and one between his first and second toes, both present for about 3 to 4 weeks, per his report. On his right medial lower leg, there are 2 ulcers with slough accumulated. The surface underneath the slough is quite fibrous, consistent with his history of multiple lower leg ulcerations. He has a triangular wound on his right lateral ankle, similar in appearance to the others. Between his first and second toes, he has what appears to be athlete's foot, with a fungal odor and skin breakdown with eschar and slough present. Patient History Information obtained from Patient. Allergies No Known Allergies Family History Cancer, Diabetes - Father, Hypertension - Father, No family history of Heart Disease, Hereditary Spherocytosis, Kidney Disease, Lung Disease, Seizures, Stroke, Thyroid Problems, Tuberculosis. Social History Current every day smoker, Marital Status - Divorced, Alcohol Use - Never, Drug Use - Prior History, Caffeine Use - Rarely. Medical History Eyes Denies history of Cataracts, Glaucoma, Optic Neuritis Ear/Nose/Mouth/Throat Denies history of Chronic sinus problems/congestion, Middle ear problems Hematologic/Lymphatic Patient has history of Anemia Denies history of Hemophilia, Human Immunodeficiency Virus, Lymphedema, Sickle Cell Disease Respiratory Patient has history of Sleep Apnea Denies history of Aspiration, Asthma, Chronic Obstructive Pulmonary Disease (COPD), Pneumothorax, Tuberculosis Cardiovascular Patient has history of Congestive Heart Failure,  Coronary Artery Disease, Peripheral Venous Disease Denies history of Angina, Arrhythmia, Deep Vein Thrombosis, Hypertension, Hypotension, Myocardial Infarction, Peripheral Arterial Disease, Phlebitis, Vasculitis Gastrointestinal Denies history of Cirrhosis , Colitis, Crohnoos, Hepatitis A, Hepatitis B, Hepatitis C Endocrine Denies history of Type I Diabetes, Type II Diabetes Genitourinary Denies history of End Stage Renal Disease Immunological Denies history of Lupus Erythematosus, Raynaudoos, Scleroderma Integumentary (Skin) Denies history of History of Burn Musculoskeletal Patient has history of Osteoarthritis Denies history of Gout, Rheumatoid Arthritis, Osteomyelitis Neurologic Denies history of Dementia, Neuropathy, Quadriplegia, Paraplegia, Seizure Disorder Oncologic Denies history of Received Chemotherapy, Received Radiation Psychiatric Denies history of Anorexia/bulimia, Confinement Anxiety Hospitalization/Surgery History - right hip nailing 10/17/2019. - 05/17/2018 nephrolithotomy left. - 01/29/2017 right  lymph noes biopsy. - cystoscopy 2017, 2018. - 2017 gastric bypass surgery. Medical A Surgical History Notes nd Gastrointestinal gastric bypass Objective Constitutional Slightly bradycardic, asymptomatic.Marland Kitchen No acute distress. Vitals Time Taken: 8:40 AM, Temperature: 98.7 F, Pulse: 57 bpm, Respiratory Rate: 17 breaths/min, Blood Pressure: 124/74 mmHg. Respiratory Normal work of breathing on room air.. Cardiovascular [name], [name] ([id number]) [id number] Page 9 of 13 Skin changes consistent with longstanding lymphedema and venous insufficiency. 2-3+ pitting edema bilaterally. General Notes: 02/03/2022: On his right medial lower leg, there are 2 ulcers with slough accumulated. The surface underneath the slough is quite fibrous, consistent with his history of multiple lower leg ulcerations. He has a triangular wound on his right lateral ankle,  similar in appearance to the others. Between his first and second toes, he has what appears to be athlete's foot, with a fungal odor and skin breakdown with eschar and slough present. Integumentary (Hair, Skin) Wound #5 status is Open. Original cause of wound was Trauma. The date acquired was: 02/03/2022. The wound is located on the Right,Circumferential Lower Leg. The wound measures 8cm length x 14cm width x 0.2cm depth; 87.965cm^2 area and 17.593cm^3 volume. There is Fat Layer (Subcutaneous Tissue) exposed. There is no tunneling or undermining noted. There is a medium amount of serosanguineous drainage noted. The wound margin is distinct with the outline attached to the wound base. There is medium (34-66%) red, pink granulation within the wound bed. There is a medium (34-66%) amount of necrotic tissue within the wound bed including Adherent Slough. The periwound skin appearance exhibited: Hemosiderin Staining. The periwound skin appearance did not exhibit: Callus, Crepitus, Excoriation, Induration, Rash, Scarring, Dry/Scaly, Maceration, Atrophie Blanche, Cyanosis, Ecchymosis, Mottled, Pallor, Rubor, Erythema. Periwound temperature was noted as No Abnormality. The periwound has tenderness on palpation. Wound #6 status is Open. Original cause of wound was Gradually Appeared. The date acquired was: 01/11/2022. The wound is located on the Right T - Web oe between 1st and 2nd. The wound measures 4cm length x 2cm width x 0.2cm depth; 6.283cm^2 area and 1.257cm^3 volume. There is Fat Layer (Subcutaneous Tissue) exposed. There is no tunneling or undermining noted. There is a medium amount of serosanguineous drainage noted. Foul odor after cleansing was noted. The wound margin is distinct with the outline attached to the wound base. There is small (1-33%) red, pink granulation within the wound bed. There is a large (67-100%) amount of necrotic tissue within the wound bed including Adherent Slough. The  periwound skin appearance did not exhibit: Callus, Crepitus, Excoriation, Induration, Rash, Scarring, Dry/Scaly, Maceration, Atrophie Blanche, Cyanosis, Ecchymosis, Hemosiderin Staining, Mottled, Pallor, Rubor, Erythema. Periwound temperature was noted as No Abnormality. The periwound has tenderness on palpation. Wound #7 status is Open. Original cause of wound was Gradually Appeared. The date acquired was: 01/11/2022. The wound is located on the Right,Lateral Ankle. The wound measures 1.8cm length x 1.5cm width x 0.2cm depth; 2.121cm^2 area and 0.424cm^3 volume. There is Fat Layer (Subcutaneous Tissue) exposed. There is no tunneling or undermining noted. There is a medium amount of serosanguineous drainage noted. The wound margin is distinct with the outline attached to the wound base. There is medium (34-66%) red, pink granulation within the wound bed. There is a medium (34-66%) amount of necrotic tissue within the wound bed including Adherent Slough. The periwound skin appearance did not exhibit: Callus, Crepitus, Excoriation, Induration, Rash, Scarring, Dry/Scaly, Maceration, Atrophie Blanche, Cyanosis, Ecchymosis, Hemosiderin Staining, Mottled, Pallor, Rubor, Erythema. Periwound temperature was noted as No Abnormality. The periwound has  tenderness on palpation. 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 There was a Excisional Skin/Subcutaneous Tissue Debridement with a total area of 112 sq cm performed by  Fredirick Maudlin, MD. With the following instrument(s): Curette to remove Viable and Non-Viable tissue/material. Material removed includes Subcutaneous Tissue and Slough and after achieving pain control using Lidocaine. No specimens were taken. A time out was conducted at 08:45, prior to the start of the procedure. A Minimum amount of bleeding was controlled with Pressure. The procedure was tolerated well with a pain level of 0 throughout and a pain level of 0 following the procedure. Post Debridement Measurements: 8cm length x 14cm width x 0.2cm depth; 17.593cm^3 volume. Character of Wound/Ulcer Post Debridement is improved. Severity of Tissue Post Debridement is: Fat layer exposed. Post procedure Diagnosis Wound #5: Same as Pre-Procedure Wound #6 Pre-procedure diagnosis of Wound #6 is a Lymphedema located on the Right T - Web between 1st and 2nd . There was a Excisional Skin/Subcutaneous oe Tissue Debridement with a total area of 8 sq cm performed by Fredirick Maudlin, MD. With the following instrument(s): Curette to remove Viable and Non- Viable tissue/material. Material removed includes Subcutaneous Tissue and Slough and after achieving pain control using Lidocaine. No specimens were taken. A time out was conducted at 08:45, prior to the start of the procedure. A Minimum amount of bleeding was controlled with Pressure. The procedure was tolerated well with a pain level of 0 throughout and a pain level of 0 following the procedure. Post Debridement Measurements: 4cm length x 2cm width x 0.2cm depth; 1.257cm^3 volume. Character of Wound/Ulcer Post Debridement is improved. Post procedure Diagnosis Wound #6: Same as Pre-Procedure Wound #7 Pre-procedure diagnosis of Wound #7 is a Venous Leg Ulcer located on the Right,Lateral Ankle .Severity of Tissue Pre Debridement is: Fat layer exposed. There was a Excisional Skin/Subcutaneous Tissue Debridement with a total area of 2.7 sq cm performed by Fredirick Maudlin, MD. With the following instrument(s): Curette to remove Viable and Non-Viable tissue/material. Material removed includes Subcutaneous Tissue and Slough and after achieving pain control using Lidocaine. No specimens were taken. A time out was conducted at 08:45, prior to the start of the procedure. A Minimum amount of bleeding was controlled with Pressure. The procedure was tolerated well with a pain level of 0 throughout and a pain level of 0 following the procedure. Post Debridement Measurements: 1.8cm length x 1.5cm width x 0.2cm depth; 0.424cm^3 volume. Character of Wound/Ulcer Post Debridement is improved. Severity of Tissue Post Debridement is: Fat layer exposed. Post procedure Diagnosis Wound #7: Same as Pre-Procedure [name], [name] ([id number]) [id number] Page 10 of 13 Plan Follow-up Appointments: Return Appointment in 1 week. - w/ Dr. Celine Ahr and [name] # 4 (he prefers early in the morning) Anesthetic: (In clinic) Topical Lidocaine 5% applied to wound bed Bathing/ Shower/ Hygiene: May shower with protection but do not get wound dressing(s) wet. Edema Control - Lymphedema / SCD / Other: Lymphedema Pumps. Use Lymphedema pumps on leg(s) 2-3 times a day for 45-60 minutes. If wearing any wraps or  hose, do not remove them. Continue exercising as instructed. Elevate legs to the level of the heart or above for 30 minutes daily and/or when sitting, a frequency of: Avoid standing for long periods of time. Services and Therapies ordered were: Venous Studies -Bilateral The following medication(s) was prescribed: lidocaine topical 5 % gel gel topical for PRN debridements/pain was prescribed at facility WOUND #5: - Lower Leg Wound Laterality: Right, Circumferential Cleanser: Soap and Water 1 x Per Week/7 Days Discharge Instructions: May shower and wash wound with dial antibacterial soap and water prior to dressing change. Cleanser: Wound Cleanser 1 x Per  Week/7 Days Discharge Instructions: Cleanse the wound with wound cleanser prior to applying a clean dressing using gauze sponges, not tissue or cotton balls. Prim Dressing: KerraCel Ag Gelling Fiber Dressing, 4x5 in (silver alginate) 1 x Per Week/7 Days ary Discharge Instructions: Apply silver alginate to wound bed as instructed Secondary Dressing: ABD Pad, 5x9 1 x Per Week/7 Days Discharge Instructions: Apply over primary dressing as directed. Secondary Dressing: Woven Gauze Sponge, Non-Sterile 4x4 in 1 x Per Week/7 Days Discharge Instructions: Apply over primary dressing as directed. Com pression Wrap: FourPress (4 layer compression wrap) 1 x Per Week/7 Days Discharge Instructions: Apply four layer compression as directed. May also use Miliken CoFlex 2 layer compression system as alternative. WOUND #6: - T - Web between 1st and 2nd Wound Laterality: Right oe Cleanser: Wound Cleanser (DME) (Generic) 1 x Per Day/15 Days Discharge Instructions: Cleanse the wound with wound cleanser prior to applying a clean dressing using gauze sponges, not tissue or cotton balls. Topical: Ketoconazole Cream 2% 1 x Per Day/15 Days Discharge Instructions: Apply Ketoconazole as directed. Use over the counter antifungal powder at home Prim Dressing: KerraCel Ag Gelling Fiber Dressing, 4x5 in (silver alginate) (DME) (Generic) 1 x Per Day/15 Days ary Discharge Instructions: Apply silver alginate to wound bed as instructed Secondary Dressing: Woven Gauze Sponge, Non-Sterile 4x4 in (DME) (Generic) 1 x Per Day/15 Days Discharge Instructions: Apply over primary dressing as directed. Secured With: Child psychotherapist, Sterile 2x75 (in/in) (DME) (Generic) 1 x Per Day/15 Days Discharge Instructions: Secure with stretch gauze as directed. Secured With: 57M Medipore H Soft Cloth Surgical T ape, 4 x 10 (in/yd) (DME) (Generic) 1 x Per Day/15 Days Discharge Instructions: Secure with tape as directed. Secured  With: [employer] Size 2, 10 (yds) (DME) (Generic) 1 x Per Day/15 Days WOUND #7: - Ankle Wound Laterality: Right, Lateral Cleanser: Soap and Water 1 x Per Week/7 Days Discharge Instructions: May shower and wash wound with dial antibacterial soap and water prior to dressing change. Cleanser: Wound Cleanser 1 x Per Week/7 Days Discharge Instructions: Cleanse the wound with wound cleanser prior to applying a clean dressing using gauze sponges, not tissue or cotton balls. Prim Dressing: KerraCel Ag Gelling Fiber Dressing, 4x5 in (silver alginate) 1 x Per Week/7 Days ary Discharge Instructions: Apply silver alginate to wound bed as instructed Secondary Dressing: ABD Pad, 5x9 1 x Per Week/7 Days Discharge Instructions: Apply over primary dressing as directed. Secondary Dressing: Woven Gauze Sponge, Non-Sterile 4x4 in 1 x Per Week/7 Days Discharge Instructions: Apply over primary dressing as directed. Com pression Wrap: FourPress (4 layer compression wrap) 1 x Per Week/7 Days Discharge Instructions: Apply four layer compression as directed. May also use Miliken CoFlex 2 layer compression system as alternative. 02/03/2022: Patient well-known to the wound care center who returns with new wounds on his right leg. On his right medial lower  leg, there are 2 ulcers with slough accumulated. The surface underneath the slough is quite fibrous, consistent with his history of multiple lower leg ulcerations. He has a triangular wound on his right lateral ankle, similar in appearance to the others. Between his first and second toes, he has what appears to be athlete's foot, with a fungal odor and skin breakdown with eschar and slough present. I used a curette to debride slough and nonviable subcutaneous tissue from all of his wounds. We will use silver alginate with 4-layer compression on the leg. We will apply ketoconazole antifungal between his first and second toe and use some silver alginate there to help with  moisture. I recommended that he purchase an over-the-counter topical antifungal, such as Lotrimin and apply this at least daily. He will follow-up in 1 week. Electronic Signature(s) Signed: 02/03/2022 8:58:18 AM By: Fredirick Maudlin MD FACS Entered By: Fredirick Maudlin on 02/03/2022 08:58:18 HxROS Details -------------------------------------------------------------------------------- [name] ([id number]) [id number] Page 11 of 13 Patient Name: Date of Service: [name], [name] 02/03/2022 8:00 A M Medical Record Number: [id number] Patient Account Number: [id number] Date of Birth/Sex: Treating RN: 1960-11-19 (61 y.o. [name] Primary Care Provider: [name] Other Clinician: Referring Provider: Treating Provider/Extender: Colin Mulders HN Weeks in Treatment: 0 Information Obtained From Patient Eyes Medical History: Negative for: Cataracts; Glaucoma; Optic Neuritis Ear/Nose/Mouth/Throat Medical History: Negative for: Chronic sinus problems/congestion; Middle ear problems Hematologic/Lymphatic Medical History: Positive for: Anemia Negative for: Hemophilia; Human Immunodeficiency Virus; Lymphedema; Sickle Cell Disease Respiratory Medical History: Positive for: Sleep Apnea Negative for: Aspiration; Asthma; Chronic Obstructive Pulmonary Disease (COPD); Pneumothorax; Tuberculosis Cardiovascular Medical History: Positive for: Congestive Heart Failure; Coronary Artery Disease; Peripheral Venous Disease Negative for: Angina; Arrhythmia; Deep Vein Thrombosis; Hypertension; Hypotension; Myocardial Infarction; Peripheral Arterial Disease; Phlebitis; Vasculitis Gastrointestinal Medical History: Negative for: Cirrhosis ; Colitis; Crohns; Hepatitis A; Hepatitis B; Hepatitis C Past Medical History Notes: gastric bypass Endocrine Medical History: Negative for: Type I Diabetes; Type II Diabetes Genitourinary Medical  History: Negative for: End Stage Renal Disease Immunological Medical History: Negative for: Lupus Erythematosus; Raynauds; Scleroderma Integumentary (Skin) Medical History: Negative for: History of Burn Musculoskeletal Medical History: Positive for: Osteoarthritis Negative for: Gout; Rheumatoid Arthritis; Osteomyelitis Neurologic Medical History: Negative for: Dementia; Neuropathy; Quadriplegia; Paraplegia; Seizure Disorder Oncologic Medical History: [name], [name] ([id number]) [id number] Page 12 of 13 Negative for: Received Chemotherapy; Received Radiation Psychiatric Medical History: Negative for: Anorexia/bulimia; Confinement Anxiety Immunizations Pneumococcal Vaccine: Received Pneumococcal Vaccination: Yes Received Pneumococcal Vaccination On or After 60th Birthday: Yes Implantable Devices None Hospitalization / Surgery History Type of Hospitalization/Surgery right hip nailing 10/17/2019 05/17/2018 nephrolithotomy left 01/29/2017 right lymph noes biopsy cystoscopy 2017, 2018 2017 gastric bypass surgery Family and Social History Cancer: Yes; Diabetes: Yes - Father; Heart Disease: No; Hereditary Spherocytosis: No; Hypertension: Yes - Father; Kidney Disease: No; Lung Disease: No; Seizures: No; Stroke: No; Thyroid Problems: No; Tuberculosis: No; Current every day smoker; Marital Status - Divorced; Alcohol Use: Never; Drug Use: Prior History; Caffeine Use: Rarely; Financial Concerns: No; Food, Clothing or Shelter Needs: No; Support System Lacking: No; Transportation Concerns: No Engineer, maintenance) Signed: 02/03/2022 5:09:23 PM By: Deon Pilling RN, BSN Signed: 02/05/2022 10:15:01 AM By: Kalman Shan DO Entered By: Deon Pilling on 02/02/2022 16:20:17 -------------------------------------------------------------------------------- SuperBill Details Patient Name: Date of Service: Marvin Atlas, [name] UL J. 02/03/2022 Medical Record Number:  [id number] Patient Account Number: [id number] Date of Birth/Sex: Treating RN: 1960-06-06 (61 y.o. M) Primary Care Provider: Griffin Basil,  JO HN Other Clinician: Referring Provider: Treating Provider/Extender: Osborne Oman HN Weeks in Treatment: 0 Diagnosis Coding ICD-10 Codes Code Description [phone] Chronic venous hypertension (idiopathic) with ulcer and inflammation of right lower extremity L97.812 Non-pressure chronic ulcer of other part of right lower leg with fat layer exposed 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 Facility Procedures : CPT4 Code: [id number] Description: 79024 - WOUND CARE VISIT-LEV 5 EST PT Modifier: Quantity: 1 : CPT4 Code: [id number] Description: 11042 - DEB SUBQ TISSUE 20 SQ CM/< ICD-10 Diagnosis Description J24.268 Non-pressure chronic ulcer of other part of right lower leg with fat layer expo L97.312 Non-pressure chronic ulcer of right ankle with fat layer exposed L97.512  Non-pressure chronic ulcer of other part of right foot with fat layer exposed Modifier: sed Quantity: 1 : Christy, PAU CPT4 Code: [id number] L J ([id number]) ICD Description: 11045 - DEB SUBQ TISS EA ADDL 20CM [id number] Diagnosis Description L97.812 Non-pressure chronic ulcer of other part of right lower leg with fat layer exposed L97.312 Non-pressure chronic ulcer of right ankle with fat layer  exposed L97.512 Non-pressure chronic ulcer of other part of right foot with fat layer exposed Modifier: [id number] Quantity: 6 27.pdf Page 13 of 13 Physician Procedures : CPT4 Code Description Modifier [id number] - WC PHYS LEVEL 4 - EST PT ICD-10 Diagnosis Description O27.741 Non-pressure chronic ulcer of other part of right lower leg with fat layer exposed L97.312 Non-pressure chronic ulcer of right ankle with fat  layer exposed L97.512 Non-pressure  chronic ulcer of other part of right foot with fat layer exposed I87.331 Chronic venous hypertension (idiopathic) with ulcer and inflammation of right lower extremity Quantity: 1 : [id number] 11042 - WC PHYS SUBQ TISS 20 SQ CM ICD-10 Diagnosis Description L97.812 Non-pressure chronic ulcer of other part of right lower leg with fat layer exposed L97.312 Non-pressure chronic ulcer of right ankle with fat layer exposed L97.512  Non-pressure chronic ulcer of other part of right foot with fat layer exposed Quantity: 1 : [id number] 11045 - WC PHYS SUBQ TISS EA ADDL 20 CM ICD-10 Diagnosis Description L97.812 Non-pressure chronic ulcer of other part of right lower leg with fat layer exposed L97.312 Non-pressure chronic ulcer of right ankle with fat layer exposed L97.512  Non-pressure chronic ulcer of other part of right foot with fat layer exposed Quantity: 6 Electronic Signature(s) Signed: 02/03/2022 11:09:58 AM By: Fredirick Maudlin MD FACS Signed: 02/05/2022 4:44:37 PM By: Rhae Hammock RN Previous Signature: 02/03/2022 8:58:49 AM Version By: Fredirick Maudlin MD FACS Entered By: Rhae Hammock on 02/03/2022 10:27:43

## 2022-02-05 NOTE — Progress Notes (Signed)
GRAISON, LEINBERGER ([id number]) [phone] [id number] Page 1 of 4 Visit Report for 02/03/2022 Abuse Risk Screen Details Patient Name: Date of Service: [name], [name] 02/03/2022 8:00 A M Medical Record Number: [id number] Patient Account Number: [id number] Date of Birth/Sex: Treating RN: 1960/06/18 (61 y.o. [name], [name] Primary Care Silvina Hackleman: [name] Other Clinician: Referring Zygmunt Mcglinn: Treating Dariane Natzke/Extender: Allayne Butcher, JO HN Weeks in Treatment: 0 Abuse Risk Screen Items Answer ABUSE RISK SCREEN: Has anyone close to you tried to hurt or harm you recentlyo No Do you feel uncomfortable with anyone in your familyo No Has anyone forced you do things that you didnt want to doo No Electronic Signature(s) Signed: 02/05/2022 4:44:37 PM By: Rhae Hammock RN Entered By: Rhae Hammock on 02/03/2022 08:13:16 -------------------------------------------------------------------------------- Activities of Daily Living Details Patient Name: Date of Service: [name], [name] 02/03/2022 8:00 A M Medical Record Number: [id number] Patient Account Number: [id number] Date of Birth/Sex: Treating RN: 09-03-1960 (61 y.o. [name], [name] Primary Care Analiz Tvedt: [name] Other Clinician: Referring Yamaira Spinner: Treating Cassidey Barrales/Extender: Allayne Butcher, JO HN Weeks in Treatment: 0 Activities of Daily Living Items Answer Activities of Daily Living (Please select one for each item) Drive Automobile Completely Able T Medications ake Completely Able Use T elephone Completely Able Care for Appearance Completely Able Use T oilet Completely Able Bath / Shower Completely Able Dress Self Completely Able Feed Self Completely Able Walk Completely Able Get In / Out Bed Completely Able Housework Completely Able Prepare Meals Completely Able Handle Money Completely Able Shop for Self Completely Able Electronic  Signature(s) Signed: 02/05/2022 4:44:37 PM By: Rhae Hammock RN Entered By: Rhae Hammock on 02/03/2022 08:13:33 Adah Salvage ([id number]) [id number] Page 2 of 4 -------------------------------------------------------------------------------- Education Screening Details Patient Name: Date of Service: [name], [place] J. 02/03/2022 8:00 A M Medical Record Number: [id number] Patient Account Number: [id number] Date of Birth/Sex: Treating RN: 1961-03-28 (61 y.o. [name], [name] Primary Care Kyliee Ortego: [name] Other Clinician: Referring Averi Kilty: Treating Tyreke Kaeser/Extender: Colin Mulders HN Weeks in Treatment: 0 Primary Learner Assessed: Patient Learning Preferences/Education Level/Primary Language Learning Preference: Explanation, Demonstration, Communication Board, Printed Material Highest Education Level: College or Above Preferred Language: English Cognitive Barrier Language Barrier: No Translator Needed: No Memory Deficit: No Emotional Barrier: No Cultural/Religious Beliefs Affecting Medical Care: No Physical Barrier Impaired Vision: No Impaired Hearing: No Decreased Hand dexterity: No Knowledge/Comprehension Knowledge Level: High Comprehension Level: High Ability to understand written instructions: High Ability to understand verbal instructions: High Motivation Anxiety Level: Calm Cooperation: Cooperative Education Importance: Denies Need Interest in Health Problems: Asks Questions Perception: Coherent Willingness to Engage in Self-Management High Activities: Readiness to Engage in Self-Management High Activities: Electronic Signature(s) Signed: 02/05/2022 4:44:37 PM By: Rhae Hammock RN Entered By: Rhae Hammock on 02/03/2022 08:13:59 -------------------------------------------------------------------------------- Fall Risk Assessment Details Patient Name: Date of Service: Marvin Atlas, [place] UL  J. 02/03/2022 8:00 A M Medical Record Number: [id number] Patient Account Number: [id number] Date of Birth/Sex: Treating RN: March 07, 1961 (61 y.o. [name], [name] Primary Care Lonetta Blassingame: [name] Other Clinician: Referring Trini Soldo: Treating Mandy Fitzwater/Extender: Colin Mulders HN Weeks in Treatment: 0 Fall Risk Assessment Items Have you had 2 or more falls in the last [address] monthso 0 No EUAL, LINDSTROM ([id number]) [phone] [id number] Page 3 of 4 Have you had any fall that resulted in injury in the last 12 monthso 0 No FALLS RISK SCREEN History of  falling - immediate or within 3 months 0 No Secondary diagnosis (Do you have 2 or more medical diagnoseso) 0 No Ambulatory aid None/bed rest/wheelchair/nurse 0 No Crutches/cane/walker 0 No Furniture 0 No Intravenous therapy Access/Saline/Heparin Lock 0 No Gait/Transferring Normal/ bed rest/ wheelchair 0 No Weak (short steps with or without shuffle, stooped but able to lift head while walking, may seek 0 No support from furniture) Impaired (short steps with shuffle, may have difficulty arising from chair, head down, impaired 0 No balance) Mental Status Oriented to own ability 0 No Electronic Signature(s) Signed: 02/05/2022 4:44:37 PM By: Rhae Hammock RN Entered By: Rhae Hammock on 02/03/2022 08:14:03 -------------------------------------------------------------------------------- Foot Assessment Details Patient Name: Date of Service: Marvin Atlas, [place] UL J. 02/03/2022 8:00 A M Medical Record Number: [id number] Patient Account Number: [id number] Date of Birth/Sex: Treating RN: [birth year] (61 y.o. [name], [name] Primary Care Kerina Simoneau: [name] Other Clinician: Referring Phyllis Whitefield: Treating Dejean Tribby/Extender: Allayne Butcher, JO HN Weeks in Treatment: 0 Foot Assessment Items Site Locations + = Sensation present, - = Sensation absent, C = Callus, U = Ulcer R =  Redness, W = Warmth, M = Maceration, PU = Pre-ulcerative lesion F = Fissure, S = Swelling, D = Dryness Assessment Right: Left: Other Deformity: No No Prior Foot Ulcer: No No Prior Amputation: No No Charcot Joint: No No Ambulatory Status: Ambulatory Without Help GaitJUJHAR, EVERETT ([id number]) [phone] [id number] Page 4 of 4 Electronic Signature(s) Signed: 02/05/2022 4:44:37 PM By: Rhae Hammock RN Entered By: Rhae Hammock on 02/03/2022 08:14:40 -------------------------------------------------------------------------------- Nutrition Risk Screening Details Patient Name: Date of Service: Marvin Atlas, [place] J. 02/03/2022 8:00 A M Medical Record Number: [id number] Patient Account Number: [id number] Date of Birth/Sex: Treating RN: [birth year] (61 y.o. [name], [name] Primary Care Zyann Mabry: [name] Other Clinician: Referring Shaneta Cervenka: Treating Arti Trang/Extender: Allayne Butcher, JO HN Weeks in Treatment: 0 Height (in): 71 Weight (lbs): 210 Body Mass Index (BMI): 29.3 Nutrition Risk Screening Items Score Screening NUTRITION RISK SCREEN: I have an illness or condition that made me change the kind and/or amount of food I eat 0 No I eat fewer than two meals per day 0 No I eat few fruits and vegetables, or milk products 0 No I have three or more drinks of beer, liquor or wine almost every day 0 No I have tooth or mouth problems that make it hard for me to eat 0 No I don't always have enough money to buy the food I need 0 No I eat alone most of the time 0 No I take three or more different prescribed or over-the-counter drugs a day 0 No Without wanting to, I have lost or gained 10 pounds in the last six months 0 No I am not always physically able to shop, cook and/or feed myself 0 No Nutrition Protocols Good Risk Protocol 0 No interventions needed Moderate Risk Protocol High Risk Proctocol Risk Level: Good Risk Score:  0 Electronic Signature(s) Signed: 02/05/2022 4:44:37 PM By: Rhae Hammock RN Entered By: Rhae Hammock on 02/03/2022 08:14:09

## 2022-02-09 ENCOUNTER — Encounter (HOSPITAL_BASED_OUTPATIENT_CLINIC_OR_DEPARTMENT_OTHER): Payer: PPO | Admitting: General Surgery

## 2022-02-09 DIAGNOSIS — L97212 Non-pressure chronic ulcer of right calf with fat layer exposed: Secondary | ICD-10-CM | POA: Diagnosis not present

## 2022-02-09 DIAGNOSIS — I87331 Chronic venous hypertension (idiopathic) with ulcer and inflammation of right lower extremity: Secondary | ICD-10-CM | POA: Diagnosis not present

## 2022-02-09 DIAGNOSIS — L97312 Non-pressure chronic ulcer of right ankle with fat layer exposed: Secondary | ICD-10-CM | POA: Diagnosis not present

## 2022-02-09 DIAGNOSIS — F1111 Opioid abuse, in remission: Secondary | ICD-10-CM | POA: Diagnosis not present

## 2022-02-09 DIAGNOSIS — L97812 Non-pressure chronic ulcer of other part of right lower leg with fat layer exposed: Secondary | ICD-10-CM | POA: Diagnosis not present

## 2022-02-09 DIAGNOSIS — I872 Venous insufficiency (chronic) (peripheral): Secondary | ICD-10-CM | POA: Diagnosis not present

## 2022-02-09 NOTE — Progress Notes (Addendum)
JUDAS, MOHAMMAD ([id number]) [id number] Page 1 of 14 Visit Report for 02/09/2022 Chief Complaint Document Details Patient Name: Date of Service: [name], [place] J. 02/09/2022 10:30 A M Medical Record Number: [id number] Patient Account Number: [id number] Date of Birth/Sex: Treating RN: 02-Jan-1961 (61 y.o. M) Primary Care Provider: [name] Other Clinician: Referring Provider: Treating Provider/Extender: Osborne Oman HN Weeks in Treatment: 0 Information Obtained from: Patient Chief Complaint 05/20/2020; patient is here for review of wounds on his bilateral lower legs in the setting of severe chronic venous insufficiency with lymphedema 02/03/2022: returns to clinic today with new wounds on RLE Electronic Signature(s) Signed: 02/09/2022 11:31:01 AM By: Fredirick Maudlin MD FACS Entered By: Fredirick Maudlin on 02/09/2022 11:31:01 -------------------------------------------------------------------------------- Debridement Details Patient Name: Date of Service: Marvin Atlas, PA UL J. 02/09/2022 10:30 A M Medical Record Number: [id number] Patient Account Number: [id number] Date of Birth/Sex: Treating RN: Jan 21, 1961 (61 y.o. [name] Primary Care Provider: [name] Other Clinician: Referring Provider: Treating Provider/Extender: Osborne Oman HN Weeks in Treatment: 0 Debridement Performed for Assessment: Wound #5 Right,Lateral Lower Leg Performed By: Physician Fredirick Maudlin, MD Debridement Type: Debridement Severity of Tissue Pre Debridement: Fat layer exposed Level of Consciousness (Pre-procedure): Awake and Alert Pre-procedure Verification/Time Out Yes - 11:15 Taken: Start Time: 11:18 Pain Control: Lidocaine 4% T opical Solution T Area Debrided (L x W): otal 1.5 (cm) x 1 (cm) = 1.5 (cm) Tissue and other material debrided: Non-Viable, Slough, Slough Level: Non-Viable Tissue Debridement Description:  Selective/Open Wound Instrument: Curette Bleeding: Minimum Hemostasis Achieved: Pressure Procedural Pain: 5 Post Procedural Pain: 3 Response to Treatment: Procedure was tolerated well Level of Consciousness (Post- Awake and Alert procedure): Post Debridement Measurements of Total Wound Length: (cm) 1.5 Width: (cm) 1 Depth: (cm) 0.1 Volume: (cm) 0.118 Character of Wound/Ulcer Post Debridement: Requires Further Debridement TREYDEN, HAKIM ([id number]) [id number] Page 2 of 14 Severity of Tissue Post Debridement: Fat layer exposed Post Procedure Diagnosis Same as Pre-procedure Notes scribed by Baruch Gouty, RN for Dr. Celine Ahr Electronic Signature(s) Signed: 02/09/2022 2:58:10 PM By: Fredirick Maudlin MD FACS Signed: 02/09/2022 5:08:53 PM By: Baruch Gouty RN, BSN Entered By: Baruch Gouty on 02/09/2022 11:26:23 -------------------------------------------------------------------------------- Debridement Details Patient Name: Date of Service: Marvin Atlas, PA UL J. 02/09/2022 10:30 A M Medical Record Number: [id number] Patient Account Number: [id number] Date of Birth/Sex: Treating RN: 05/27/1960 (62 y.o. [name] Primary Care Provider: [name] Other Clinician: Referring Provider: Treating Provider/Extender: Osborne Oman HN Weeks in Treatment: 0 Debridement Performed for Assessment: Wound #8 Right,Posterior Lower Leg Performed By: Physician Fredirick Maudlin, MD Debridement Type: Debridement Severity of Tissue Pre Debridement: Fat layer exposed Level of Consciousness (Pre-procedure): Awake and Alert Pre-procedure Verification/Time Out Yes - 11:15 Taken: Start Time: 11:18 Pain Control: Lidocaine 4% T opical Solution T Area Debrided (L x W): otal 2.8 (cm) x 1.4 (cm) = 3.92 (cm) Tissue and other material debrided: Viable, Non-Viable, Slough, Subcutaneous, Slough Level: Skin/Subcutaneous Tissue Debridement  Description: Excisional Instrument: Curette Bleeding: Minimum Hemostasis Achieved: Pressure Procedural Pain: 5 Post Procedural Pain: 3 Response to Treatment: Procedure was tolerated well Level of Consciousness (Post- Awake and Alert procedure): Post Debridement Measurements of Total Wound Length: (cm) 2.8 Width: (cm) 1.4 Depth: (cm) 0.1 Volume: (cm) 0.308 Character of Wound/Ulcer Post Debridement: Requires Further Debridement Severity of Tissue Post Debridement: Fat layer exposed Post Procedure Diagnosis Same as Pre-procedure Notes scribed by Baruch Gouty, RN for Dr.  Celine Ahr Engineer, maintenance) Signed: 02/09/2022 2:58:10 PM By: Fredirick Maudlin MD FACS Signed: 02/09/2022 5:08:53 PM By: Baruch Gouty RN, BSN Entered By: Baruch Gouty on 02/09/2022 11:26:45 Adah Salvage ([id number]) [id number] Page 3 of 14 -------------------------------------------------------------------------------- Debridement Details Patient Name: Date of Service: [name], [place] 02/09/2022 10:30 A M Medical Record Number: [id number] Patient Account Number: [id number] Date of Birth/Sex: Treating RN: [birth year] (60 y.o. [name] Primary Care Provider: [name] Other Clinician: Referring Provider: Treating Provider/Extender: Osborne Oman HN Weeks in Treatment: 0 Debridement Performed for Assessment: Wound #7 Right,Lateral Ankle Performed By: Physician Fredirick Maudlin, MD Debridement Type: Debridement Severity of Tissue Pre Debridement: Fat layer exposed Level of Consciousness (Pre-procedure): Awake and Alert Pre-procedure Verification/Time Out Yes - 11:15 Taken: Start Time: 11:18 Pain Control: Lidocaine 4% T opical Solution T Area Debrided (L x W): otal 1.7 (cm) x 1.3 (cm) = 2.21 (cm) Tissue and other material debrided: Viable, Non-Viable, Slough, Subcutaneous, Slough Level: Skin/Subcutaneous Tissue Debridement  Description: Excisional Instrument: Curette Specimen: Tissue Culture Number of Specimens T aken: 1 Bleeding: Minimum Hemostasis Achieved: Pressure Procedural Pain: 8 Post Procedural Pain: 5 Response to Treatment: Procedure was tolerated well Level of Consciousness (Post- Awake and Alert procedure): Post Debridement Measurements of Total Wound Length: (cm) 1.7 Width: (cm) 1.3 Depth: (cm) 0.1 Volume: (cm) 0.174 Character of Wound/Ulcer Post Debridement: Requires Further Debridement Severity of Tissue Post Debridement: Fat layer exposed Post Procedure Diagnosis Same as Pre-procedure Notes scribed by Baruch Gouty, RN for Dr. Celine Ahr Electronic Signature(s) Signed: 02/09/2022 2:58:10 PM By: Fredirick Maudlin MD FACS Signed: 02/09/2022 5:08:53 PM By: Baruch Gouty RN, BSN Entered By: Baruch Gouty on 02/09/2022 11:27:09 -------------------------------------------------------------------------------- HPI Details Patient Name: Date of Service: Marvin Atlas, PA UL J. 02/09/2022 10:30 A M Medical Record Number: [id number] Patient Account Number: [id number] Date of Birth/Sex: Treating RN: 03-24-1961 (61 y.o. M) Primary Care Provider: [name] Other Clinician: Referring Provider: Treating Provider/Extender: Chuck Hint, JO HN Weeks in TreatmentSHERLEY, LESER ([id number]) [id number] Page 4 of 14 History of Present Illness HPI Description: ADMISSION 05/20/2020 This is a 61 year old man who is not a diabetic however he has a long history of chronic venous insufficiency with wounds on his lower extremities. He went to the wound care center in [place] for a long period of time with recurrent wounds in these areas treated with compression wraps. He comes in today with wounds on his bilateral lower legs that he says have been there about a year on the right and about 6 months on the left. He went to see Dr. Oneida Alar on 01/17/2020 who noted  that he had no history of DVT but does have a history of venous insufficiency wounds. They noted that he was considering laser ablation in October 2020 but he canceled the procedure. He had an episode of bleeding from varicose veins in 2019. He had repeated venous reflux study on 10/6 which showed no evidence of a DVT or SVT Noted that he had venous reflux in the right greater saphenous vein in the thigh and the calf on the right. Also . noted to have venous reflux in the right femoral vein right popliteal vein and right small saphenous vein. At the time in October they wanted the wounds to heal a bit he also also had a right groin wound which is apparently closed. Not sure about the follow-up with vein and vascular. He comes in today with some light compression  wraps and collagen on the wound placed by Dr. Lilia Pro who is a Education officer, environmental in [place]. He has 2 areas on the left leg 1 anteriorly and a large area posteriorly and a smaller area on the right anterior lower leg. He has marked lower extremity edema which is nonpitting. Hemosiderin deposition on both lower legs extensively. There is dilated venules in his feet. Past medical history includes history of opioid abuse in remission, left nephrolithiasis, history of an ablation although I did not see this in Dr. Oneida Alar notes, congestive heart failure and anemia ABIs in our clinic were noncompressible bilaterally 05/27/2020; we admitted this patient to clinic last week. He has bilateral superficial venous insufficiency with bilateral wounds left greater than right. We put him in 4 layer compression with silver alginate. The he was previously cared for in the [facility] wound care clinic when it was open. His wounds are better with compression this week and there is certainly less swelling. 2/21; bilateral superficial venous insufficiency wounds. A cluster on the left lateral. He has very well-defined areas on the right anterior right and right  lateral. We are using silver alginate and 4-layer compression 2/28; bilateral superficial venous insufficiency wounds in the setting of significant stage III lymphedema. He has a cluster on the left which is smaller. His area on the right anterior and right lateral also look better. 3/14 superficial bilateral venous insufficiency wounds. Much better on both sides in fact the area on the right posterior is healed. He still has 2 small open areas on the right anterior and the left lateral 3/21; superficial bilateral venous insufficiency wounds. Everything is closed on the right side. Small area still remains on the left. We will transition him today to his right extremiteaze stocking. Still dressing the remaining wound on the left with compression 3/28; the patient's right leg remains closed in his wraparound stocking. Disappointingly today the area on the left lateral leg looks about the same. I thought this might be closed with been using silver alginate 4/4; right leg remains in his wraparound stocking.. Left lateral leg measures smaller even under illumination the wound looks good. Changed him to [facility] with additional ABD compression last week 4/11; left lateral posterior very tiny superficial wound. This should be closed by next week. Using Hydrofera Blue. 4/18; left lateral wound is totally epithelialized. He has his own external compression stocking and compression pumps. READMISSION 02/03/2022 This is a 61 year old man well-known to the wound care center. He has chronic venous insufficiency and lymphedema. He has been evaluated in the past and has documented venous reflux, but thus far, has not undergone ablation. He returns to clinic today with multiple wounds on his right lower extremity. He reports that the medial lower leg wound started when something sharp stuck into his leg about a year ago. He says he was trying to treat this at home and took a course of cephalexin that he had  leftover from a previous event. It did not improve and in the past 3 to 4 weeks, it has actually gotten worse, to the point that he is unable to use his lymphedema pumps secondary to pain. He has another ulcer on his left lateral ankle and one between his first and second toes, both present for about 3 to 4 weeks, per his report. On his right medial lower leg, there are 2 ulcers with slough accumulated. The surface underneath the slough is quite fibrous, consistent with his history of multiple lower leg ulcerations. He  has a triangular wound on his right lateral ankle, similar in appearance to the others. Between his first and second toes, he has what appears to be athlete's foot, with a fungal odor and skin breakdown with eschar and slough present. 02/09/2022: On the right medial lower leg wound, there is significant slough accumulation. The right lateral ankle wound is gray and the patient reports an increase in pain in this site. The area between his first and second toes has dried out considerably. The more proximal ulcer on his right posterior leg has some slough buildup over a fibrotic surface. Edema control is markedly improved. Electronic Signature(s) Signed: 02/09/2022 11:32:37 AM By: Fredirick Maudlin MD FACS Entered By: Fredirick Maudlin on 02/09/2022 11:32:37 -------------------------------------------------------------------------------- Physical Exam Details Patient Name: Date of Service: Marvin Atlas, PA UL J. 02/09/2022 10:30 A M Medical Record Number: [id number] Patient Account Number: [id number] Date of Birth/Sex: Treating RN: 13-Jan-1961 (61 y.o. M) Primary Care Provider: [name] Other Clinician: Referring Provider: Treating Provider/Extender: Chuck Hint, JO HN Weeks in Treatment: 0 Constitutional . . . . No acute distress.Marland Kitchen Respiratory Normal work of breathing on room air.TILMON, WISEHART ([id number]) [id number] Page 5 of  14 Notes 02/09/2022: On the right medial lower leg wound, there is significant slough accumulation. The right lateral ankle wound is gray and the patient reports an increase in pain in this site. The area between his first and second toes has dried out considerably. The more proximal ulcer on his right posterior leg has some slough buildup over a fibrotic surface. Edema control is markedly improved. Electronic Signature(s) Signed: 02/09/2022 11:33:03 AM By: Fredirick Maudlin MD FACS Entered By: Fredirick Maudlin on 02/09/2022 11:33:03 -------------------------------------------------------------------------------- Physician Orders Details Patient Name: Date of Service: Marvin Atlas, PA UL J. 02/09/2022 10:30 A M Medical Record Number: [id number] Patient Account Number: [id number] Date of Birth/Sex: Treating RN: 08/06/1960 (61 y.o. [name] Primary Care Provider: [name] Other Clinician: Referring Provider: Treating Provider/Extender: Osborne Oman HN Weeks in Treatment: 0 Verbal / Phone Orders: No Diagnosis Coding ICD-10 Coding Code Description I87.331 Chronic venous hypertension (idiopathic) with ulcer and inflammation of right lower extremity L97.812 Non-pressure chronic ulcer of other part of right lower leg with fat layer exposed 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 Follow-up Appointments ppointment in 1 week. - w/ Dr. Celine Ahr and [name] # 4 (he prefers early in the morning) Return A Anesthetic (In clinic) Topical Lidocaine 4% applied to wound bed - all wounds prior to debridement Bathing/ Shower/ Hygiene May shower with protection but do not get wound dressing(s) wet. Edema Control - Lymphedema / SCD / Other Lymphedema Pumps. Use Lymphedema pumps on leg(s) 2-3 times a day for 45-60 minutes. If wearing any wraps or hose, do not  remove them. Continue exercising as instructed. Elevate legs to the level of the heart or above for 30 minutes daily and/or when sitting, a frequency of: Avoid standing for long periods of time. Exercise regularly Compression stocking or Garment 20-30 mm/Hg pressure to: - toleftleg daily Wound Treatment Wound #5 - Lower Leg Wound Laterality: Right, Lateral Cleanser: Soap and Water 1 x Per Week/7 Days Discharge Instructions: May shower and wash wound with dial antibacterial soap and water prior to dressing change. Cleanser: Wound Cleanser 1 x Per Week/7 Days Discharge Instructions: Cleanse the wound with wound cleanser prior  to applying a clean dressing using gauze sponges, not tissue or cotton balls. Peri-Wound Care: Sween Lotion (Moisturizing lotion) 1 x Per Week/7 Days Discharge Instructions: Apply moisturizing lotion as directed Prim Dressing: KerraCel Ag Gelling Fiber Dressing, 4x5 in (silver alginate) 1 x Per Week/7 Days ary Discharge Instructions: Apply silver alginate to wound bed as instructed Secondary Dressing: ABD Pad, 5x9 1 x Per Week/7 Days Discharge Instructions: Apply over primary dressing as directed. Secondary Dressing: Woven Gauze Sponge, Non-Sterile 4x4 in 1 x Per Week/7 Days Discharge Instructions: Apply over primary dressing as directed. Compression Wrap: FourPress (4 layer compression wrap) 1 x Per Week/7 Days Discharge Instructions: Apply four layer compression as directed. May also use Miliken CoFlex 2 layer compression system as alternative. MAURO, ARPS ([id number]) [id number] Page 6 of 14 Wound #6 - T - Web between 1st and 2nd oe Wound Laterality: Right Cleanser: Wound Cleanser (Generic) 1 x Per Day/15 Days Discharge Instructions: Cleanse the wound with wound cleanser prior to applying a clean dressing using gauze sponges, not tissue or cotton balls. Topical: Ketoconazole Cream 2% 1 x Per Day/15 Days Discharge Instructions: Apply  Ketoconazole as directed. Use over the counter antifungal powder at home Prim Dressing: KerraCel Ag Gelling Fiber Dressing, 4x5 in (silver alginate) (Generic) 1 x Per Day/15 Days ary Discharge Instructions: Apply silver alginate to wound bed as instructed Secondary Dressing: Woven Gauze Sponge, Non-Sterile 4x4 in (Generic) 1 x Per Day/15 Days Discharge Instructions: Apply over primary dressing as directed. Secured With: Child psychotherapist, Sterile 2x75 (in/in) (Generic) 1 x Per Day/15 Days Discharge Instructions: Secure with stretch gauze as directed. Secured With: 74M Medipore H Soft Cloth Surgical T ape, 4 x 10 (in/yd) (Generic) 1 x Per Day/15 Days Discharge Instructions: Secure with tape as directed. Secured With: [employer] Size 2, 10 (yds) (Generic) 1 x Per Day/15 Days Wound #7 - Ankle Wound Laterality: Right, Lateral Cleanser: Soap and Water 1 x Per Week/7 Days Discharge Instructions: May shower and wash wound with dial antibacterial soap and water prior to dressing change. Cleanser: Wound Cleanser 1 x Per Week/7 Days Discharge Instructions: Cleanse the wound with wound cleanser prior to applying a clean dressing using gauze sponges, not tissue or cotton balls. Peri-Wound Care: Sween Lotion (Moisturizing lotion) 1 x Per Week/7 Days Discharge Instructions: Apply moisturizing lotion as directed Prim Dressing: KerraCel Ag Gelling Fiber Dressing, 4x5 in (silver alginate) 1 x Per Week/7 Days ary Discharge Instructions: Apply silver alginate to wound bed as instructed Secondary Dressing: ABD Pad, 5x9 1 x Per Week/7 Days Discharge Instructions: Apply over primary dressing as directed. Secondary Dressing: Woven Gauze Sponge, Non-Sterile 4x4 in 1 x Per Week/7 Days Discharge Instructions: Apply over primary dressing as directed. Compression Wrap: FourPress (4 layer compression wrap) 1 x Per Week/7 Days Discharge Instructions: Apply four layer compression as directed. May also use  Miliken CoFlex 2 layer compression system as alternative. Wound #8 - Lower Leg Wound Laterality: Right, Posterior Cleanser: Soap and Water 1 x Per Week/7 Days Discharge Instructions: May shower and wash wound with dial antibacterial soap and water prior to dressing change. Cleanser: Wound Cleanser 1 x Per Week/7 Days Discharge Instructions: Cleanse the wound with wound cleanser prior to applying a clean dressing using gauze sponges, not tissue or cotton balls. Peri-Wound Care: Sween Lotion (Moisturizing lotion) 1 x Per Week/7 Days Discharge Instructions: Apply moisturizing lotion as directed Prim Dressing: KerraCel Ag Gelling Fiber Dressing, 4x5 in (silver alginate) 1 x Per Week/7 Days ary  Discharge Instructions: Apply silver alginate to wound bed as instructed Secondary Dressing: ABD Pad, 5x9 1 x Per Week/7 Days Discharge Instructions: Apply over primary dressing as directed. Secondary Dressing: Woven Gauze Sponge, Non-Sterile 4x4 in 1 x Per Week/7 Days Discharge Instructions: Apply over primary dressing as directed. Compression Wrap: FourPress (4 layer compression wrap) 1 x Per Week/7 Days Discharge Instructions: Apply four layer compression as directed. May also use Miliken CoFlex 2 layer compression system as alternative. Laboratory naerobe culture (MICRO) Bacteria identified in Unspecified specimen by A LOINC Code: 502-7 Convenience Name: Anaerobic culture BOE, DEANS ([id number]) [id number] Page 7 of 14 Patient Medications llergies: No Known Allergies A Notifications Medication Indication Start End prior to debridement 02/09/2022 lidocaine DOSE topical 4 % cream - cream topical Electronic Signature(s) Signed: 02/09/2022 2:58:10 PM By: Fredirick Maudlin MD FACS Signed: 02/09/2022 5:08:53 PM By: Baruch Gouty RN, BSN Entered By: Baruch Gouty on 02/09/2022  11:51:37 -------------------------------------------------------------------------------- Problem List Details Patient Name: Date of Service: Marvin Atlas, PA UL J. 02/09/2022 10:30 A M Medical Record Number: [id number] Patient Account Number: [id number] Date of Birth/Sex: Treating RN: 1961-01-23 (61 y.o. [name] Primary Care Provider: [name] Other Clinician: Referring Provider: Treating Provider/Extender: Osborne Oman HN Weeks in Treatment: 0 Active Problems ICD-10 Encounter Code Description Active Date MDM Diagnosis I87.331 Chronic venous hypertension (idiopathic) with ulcer and inflammation of right 02/03/2022 No Yes lower extremity L97.812 Non-pressure chronic ulcer of other part of right lower leg with fat layer 02/03/2022 No Yes exposed L97.312 Non-pressure chronic ulcer of right ankle with fat layer exposed 02/03/2022 No Yes L97.512 Non-pressure chronic ulcer of other part of right foot with fat layer exposed 02/03/2022 No Yes Inactive Problems Resolved Problems Electronic Signature(s) Signed: 02/09/2022 11:23:29 AM By: Fredirick Maudlin MD FACS Entered By: Fredirick Maudlin on 02/09/2022 11:23:28 -------------------------------------------------------------------------------- Progress Note Details Patient Name: Date of Service: Marvin Atlas, PA UL J. 02/09/2022 10:30 A Wylene Simmer ([id number]) [id number] Page 8 of 14 Medical Record Number: [id number] Patient Account Number: [id number] Date of Birth/Sex: Treating RN: [birth year] (61 y.o. M) Primary Care Provider: [name] Other Clinician: Referring Provider: Treating Provider/Extender: Osborne Oman HN Weeks in Treatment: 0 Subjective Chief Complaint Information obtained from Patient 05/20/2020; patient is here for review of wounds on his bilateral lower legs in the setting of severe chronic venous insufficiency with lymphedema  02/03/2022: returns to clinic today with new wounds on RLE History of Present Illness (HPI) ADMISSION 05/20/2020 This is a 61 year old man who is not a diabetic however he has a long history of chronic venous insufficiency with wounds on his lower extremities. He went to the wound care center in [place] for a long period of time with recurrent wounds in these areas treated with compression wraps. He comes in today with wounds on his bilateral lower legs that he says have been there about a year on the right and about 6 months on the left. He went to see Dr. Oneida Alar on 01/17/2020 who noted that he had no history of DVT but does have a history of venous insufficiency wounds. They noted that he was considering laser ablation in October 2020 but he canceled the procedure. He had an episode of bleeding from varicose veins in 2019. He had repeated venous reflux study on 10/6 which showed no evidence of a DVT or SVT Noted that he had venous reflux in the right greater saphenous vein in the thigh and the  calf on the right. Also . noted to have venous reflux in the right femoral vein right popliteal vein and right small saphenous vein. At the time in October they wanted the wounds to heal a bit he also also had a right groin wound which is apparently closed. Not sure about the follow-up with vein and vascular. He comes in today with some light compression wraps and collagen on the wound placed by Dr. Lilia Pro who is a Education officer, environmental in [place]. He has 2 areas on the left leg 1 anteriorly and a large area posteriorly and a smaller area on the right anterior lower leg. He has marked lower extremity edema which is nonpitting. Hemosiderin deposition on both lower legs extensively. There is dilated venules in his feet. Past medical history includes history of opioid abuse in remission, left nephrolithiasis, history of an ablation although I did not see this in Dr. Oneida Alar notes, congestive heart failure and  anemia ABIs in our clinic were noncompressible bilaterally 05/27/2020; we admitted this patient to clinic last week. He has bilateral superficial venous insufficiency with bilateral wounds left greater than right. We put him in 4 layer compression with silver alginate. The he was previously cared for in the [facility] wound [facility] clinic when it was open. His wounds are better with compression this week and there is certainly less swelling. 2/21; bilateral superficial venous insufficiency wounds. A cluster on the left lateral. He has very well-defined areas on the right anterior right and right lateral. We are using silver alginate and 4-layer compression 2/28; bilateral superficial venous insufficiency wounds in the setting of significant stage III lymphedema. He has a cluster on the left which is smaller. His area on the right anterior and right lateral also look better. 3/14 superficial bilateral venous insufficiency wounds. Much better on both sides in fact the area on the right posterior is healed. He still has 2 small open areas on the right anterior and the left lateral 3/21; superficial bilateral venous insufficiency wounds. Everything is closed on the right side. Small area still remains on the left. We will transition him today to his right extremiteaze stocking. Still dressing the remaining wound on the left with compression 3/28; the patient's right leg remains closed in his wraparound stocking. Disappointingly today the area on the left lateral leg looks about the same. I thought this might be closed with been using silver alginate 4/4; right leg remains in his wraparound stocking.. Left lateral leg measures smaller even under illumination the wound looks good. Changed him to [facility] with additional ABD compression last week 4/11; left lateral posterior very tiny superficial wound. This should be closed by next week. Using Hydrofera Blue. 4/18; left lateral wound is totally  epithelialized. He has his own external compression stocking and compression pumps. READMISSION 02/03/2022 This is a 61 year old man well-known to the wound care center. He has chronic venous insufficiency and lymphedema. He has been evaluated in the past and has documented venous reflux, but thus far, has not undergone ablation. He returns to clinic today with multiple wounds on his right lower extremity. He reports that the medial lower leg wound started when something sharp stuck into his leg about a year ago. He says he was trying to treat this at home and took a course of cephalexin that he had leftover from a previous event. It did not improve and in the past 3 to 4 weeks, it has actually gotten worse, to the point that he is unable to  use his lymphedema pumps secondary to pain. He has another ulcer on his left lateral ankle and one between his first and second toes, both present for about 3 to 4 weeks, per his report. On his right medial lower leg, there are 2 ulcers with slough accumulated. The surface underneath the slough is quite fibrous, consistent with his history of multiple lower leg ulcerations. He has a triangular wound on his right lateral ankle, similar in appearance to the others. Between his first and second toes, he has what appears to be athlete's foot, with a fungal odor and skin breakdown with eschar and slough present. 02/09/2022: On the right medial lower leg wound, there is significant slough accumulation. The right lateral ankle wound is gray and the patient reports an increase in pain in this site. The area between his first and second toes has dried out considerably. The more proximal ulcer on his right posterior leg has some slough buildup over a fibrotic surface. Edema control is markedly improved. Patient History Information obtained from Patient. Family History Cancer, Diabetes - Father, Hypertension - Father, No family history of Heart Disease, Hereditary  Spherocytosis, Kidney Disease, Lung Disease, Seizures, Stroke, Thyroid Problems, Tuberculosis. Social History Current every day smoker, Marital Status - Divorced, Alcohol Use - Never, Drug Use - Prior History, Caffeine Use - Rarely. Medical History Eyes Denies history of Cataracts, Glaucoma, Optic Neuritis Ear/Nose/Mouth/Throat Denies history of Chronic sinus problems/congestion, Middle ear problems Hematologic/Lymphatic Patient has history of Anemia Denies history of Hemophilia, Human Immunodeficiency Virus, Lymphedema, Sickle Cell Disease Respiratory Patient has history of Sleep Apnea BISHOY, CUPP ([id number]) [id number] Page 9 of 14 Denies history of Aspiration, Asthma, Chronic Obstructive Pulmonary Disease (COPD), Pneumothorax, Tuberculosis Cardiovascular Patient has history of Congestive Heart Failure, Coronary Artery Disease, Peripheral Venous Disease Denies history of Angina, Arrhythmia, Deep Vein Thrombosis, Hypertension, Hypotension, Myocardial Infarction, Peripheral Arterial Disease, Phlebitis, Vasculitis Gastrointestinal Denies history of Cirrhosis , Colitis, Crohnoos, Hepatitis A, Hepatitis B, Hepatitis C Endocrine Denies history of Type I Diabetes, Type II Diabetes Genitourinary Denies history of End Stage Renal Disease Immunological Denies history of Lupus Erythematosus, Raynaudoos, Scleroderma Integumentary (Skin) Denies history of History of Burn Musculoskeletal Patient has history of Osteoarthritis Denies history of Gout, Rheumatoid Arthritis, Osteomyelitis Neurologic Denies history of Dementia, Neuropathy, Quadriplegia, Paraplegia, Seizure Disorder Oncologic Denies history of Received Chemotherapy, Received Radiation Psychiatric Denies history of Anorexia/bulimia, Confinement Anxiety Hospitalization/Surgery History - right hip nailing 10/17/2019. - 05/17/2018 nephrolithotomy left. - 01/29/2017 right lymph noes biopsy. - cystoscopy  2017, 2018. - 2017 gastric bypass surgery. Medical A Surgical History Notes nd Gastrointestinal gastric bypass Objective Constitutional No acute distress.. Vitals Time Taken: 10:20 AM, Temperature: 97.9 F, Pulse: 71 bpm, Respiratory Rate: 20 breaths/min, Blood Pressure: 111/73 mmHg. Respiratory Normal work of breathing on room air.. General Notes: 02/09/2022: On the right medial lower leg wound, there is significant slough accumulation. The right lateral ankle wound is gray and the patient reports an increase in pain in this site. The area between his first and second toes has dried out considerably. The more proximal ulcer on his right posterior leg has some slough buildup over a fibrotic surface. Edema control is markedly improved. Integumentary (Hair, Skin) Wound #5 status is Open. Original cause of wound was Trauma. The date acquired was: 02/03/2022. The wound is located on the Right,Lateral Lower Leg. The wound measures 1.5cm length x 1cm width x 0.1cm depth; 1.178cm^2 area and 0.118cm^3 volume. There is Fat Layer (Subcutaneous Tissue) exposed. There is no tunneling  or undermining noted. There is a medium amount of serosanguineous drainage noted. The wound margin is distinct with the outline attached to the wound base. There is large (67-100%) red granulation within the wound bed. There is a small (1-33%) amount of necrotic tissue within the wound bed including Adherent Slough. The periwound skin appearance exhibited: Hemosiderin Staining. The periwound skin appearance did not exhibit: Callus, Crepitus, Excoriation, Induration, Rash, Scarring, Dry/Scaly, Maceration, Atrophie Blanche, Cyanosis, Ecchymosis, Mottled, Pallor, Rubor, Erythema. Periwound temperature was noted as No Abnormality. The periwound has tenderness on palpation. Wound #6 status is Open. Original cause of wound was Gradually Appeared. The date acquired was: 01/11/2022. The wound is located on the Right T -  Web oe between 1st and 2nd. The wound measures 1.3cm length x 1.4cm width x 0.1cm depth; 1.429cm^2 area and 0.143cm^3 volume. There is Fat Layer (Subcutaneous Tissue) exposed. There is no tunneling or undermining noted. There is a medium amount of serosanguineous drainage noted. The wound margin is distinct with the outline attached to the wound base. There is medium (34-66%) pink granulation within the wound bed. There is a medium (34-66%) amount of necrotic tissue within the wound bed including Adherent Slough. The periwound skin appearance exhibited: Maceration. The periwound skin appearance did not exhibit: Callus, Crepitus, Excoriation, Induration, Rash, Scarring, Dry/Scaly, Atrophie Blanche, Cyanosis, Ecchymosis, Hemosiderin Staining, Mottled, Pallor, Rubor, Erythema. Periwound temperature was noted as No Abnormality. The periwound has tenderness on palpation. Wound #7 status is Open. Original cause of wound was Gradually Appeared. The date acquired was: 01/11/2022. The wound is located on the Right,Lateral Ankle. The wound measures 1.7cm length x 1.3cm width x 0.2cm depth; 1.736cm^2 area and 0.347cm^3 volume. There is Fat Layer (Subcutaneous Tissue) exposed. There is a medium amount of serosanguineous drainage noted. The wound margin is distinct with the outline attached to the wound base. There is medium (34-66%) red, pink granulation within the wound bed. There is a medium (34-66%) amount of necrotic tissue within the wound bed including Adherent Slough. The periwound skin appearance did not exhibit: Callus, Crepitus, Excoriation, Induration, Rash, Scarring, Dry/Scaly, Maceration, Atrophie Blanche, Cyanosis, Ecchymosis, Hemosiderin Staining, Mottled, Pallor, Rubor, Erythema. Periwound temperature was noted as No Abnormality. The periwound has tenderness on palpation. Wound #8 status is Open. Original cause of wound was Gradually Appeared. The date acquired was: 01/11/2021. The wound is located  on the Right,Posterior Lower Leg. The wound measures 2.8cm length x 1.4cm width x 0.1cm depth; 3.079cm^2 area and 0.308cm^3 volume. There is Fat Layer (Subcutaneous Tissue) exposed. There is no tunneling or undermining noted. There is a medium amount of serosanguineous drainage noted. The wound margin is distinct with the outline attached to the wound base. There is large (67-100%) red granulation within the wound bed. There is a small (1-33%) amount of necrotic tissue within the wound bed including Adherent Slough. The periwound skin appearance had no abnormalities noted for texture. The periwound skin appearance exhibited: Hemosiderin Staining. The periwound skin appearance did not exhibit: Dry/Scaly, Maceration. Periwound temperature was noted as No Abnormality. The periwound has tenderness on palpation. 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 There was a Selective/Open Wound Non-Viable Tissue Debridement with a total area of 1.5 sq cm performed by Fredirick Maudlin, MD. With the following instrument(s): Curette to remove Non-Viable tissue/material. Material removed includes [facility] after achieving pain control using Lidocaine 4% T opical Solution. A time out was conducted at 11:15, prior to the start of the procedure. A Minimum amount of bleeding was controlled with Pressure. The procedure was tolerated well with a pain level of 5  throughout and a pain level of 3 following the procedure. Post Debridement Measurements: 1.5cm length x 1cm width x 0.1cm depth; 0.118cm^3 volume. Character of Wound/Ulcer Post Debridement requires further debridement. Severity of Tissue Post Debridement is: Fat layer exposed. Post procedure Diagnosis Wound #5: Same as Pre-Procedure General Notes: scribed by Baruch Gouty, RN for Dr. Celine Ahr. Pre-procedure diagnosis of Wound #5 is a Venous Leg Ulcer located on the Right,Lateral Lower Leg . There was a Four Layer Compression Therapy Procedure by Baruch Gouty, RN. Post procedure Diagnosis Wound #5: Same as Pre-Procedure Wound #7 Pre-procedure diagnosis of Wound #7 is a Venous Leg Ulcer located on the Right,Lateral Ankle .Severity of Tissue Pre Debridement is: Fat layer exposed. There was a Excisional Skin/Subcutaneous Tissue Debridement with a total area of 2.21 sq cm performed by Fredirick Maudlin, MD. With the following instrument(s): Curette to remove Viable and Non-Viable tissue/material. Material removed includes Subcutaneous Tissue and Slough and after achieving pain control using Lidocaine 4% T opical Solution. 1 specimen was taken by a Tissue Culture and sent to the lab per facility protocol. A time out was conducted at 11:15, prior to the start of the procedure. A Minimum amount of bleeding was controlled with Pressure. The procedure was tolerated well with a pain level of 8 throughout and a pain level of 5 following the procedure. Post Debridement Measurements: 1.7cm length x 1.3cm width x 0.1cm depth; 0.174cm^3 volume. Character of Wound/Ulcer Post Debridement requires further debridement. Severity of Tissue Post Debridement is: Fat layer exposed. Post procedure Diagnosis Wound #7: Same as Pre-Procedure General Notes: scribed by Baruch Gouty, RN for Dr. Celine Ahr. Wound #8 Pre-procedure diagnosis of Wound #8 is a Venous Leg Ulcer located on the Right,Posterior Lower Leg .Severity of  Tissue Pre Debridement is: Fat layer exposed. There was a Excisional Skin/Subcutaneous Tissue Debridement with a total area of 3.92 sq cm performed by Fredirick Maudlin, MD. With the following instrument(s): Curette to remove Viable and Non-Viable tissue/material. Material removed includes Subcutaneous Tissue and Slough and after achieving pain control using Lidocaine 4% Topical Solution. No specimens were taken. A time out was conducted at 11:15, prior to the start of the procedure. A Minimum amount of bleeding was controlled with Pressure. The procedure was tolerated well with a pain level of 5 throughout and a pain level of 3 following the procedure. Post Debridement Measurements: 2.8cm length x 1.4cm width x 0.1cm depth; 0.308cm^3 volume. Character of Wound/Ulcer Post Debridement requires further debridement. Severity of Tissue Post Debridement is: Fat layer exposed. Post procedure Diagnosis Wound #8: Same as Pre-Procedure General Notes: scribed by Baruch Gouty, RN for Dr. Celine Ahr. Plan Follow-up Appointments: Return Appointment in 1 week. - w/ Dr. Celine Ahr and [name] # 4 (he prefers early in the morning) Anesthetic: (In clinic) Topical Lidocaine 4% applied to wound bed - all wounds prior to debridement Bathing/ Shower/ Hygiene: May shower with protection but do not get wound dressing(s) wet. Edema  Control - Lymphedema / SCD / Other: Lymphedema Pumps. Use Lymphedema pumps on leg(s) 2-3 times a day for 45-60 minutes. If wearing any wraps or hose, do not remove them. Continue exercising as instructed. Elevate legs to the level of the heart or above for 30 minutes daily and/or when sitting, a frequency of: Avoid standing for long periods of time. Exercise regularly Compression stocking or Garment 20-30 mm/Hg pressure to: - toleftleg daily Laboratory ordered were: Anaerobic culture PCR ankle The following medication(s) was prescribed: lidocaine topical 4 % cream cream topical for prior to  debridement was prescribed at facility WOUND #5: - Lower Leg Wound Laterality: Right, Lateral Cleanser: Soap and Water 1 x Per Week/7 Days Discharge Instructions: May shower and wash wound with dial antibacterial soap and water prior to dressing change. Cleanser: Wound Cleanser 1 x Per Week/7 Days Discharge Instructions: Cleanse the wound with wound cleanser prior to applying a clean dressing using gauze sponges, not tissue or cotton balls. Peri-Wound Care: Sween Lotion (Moisturizing lotion) 1 x Per Week/7 Days Discharge Instructions: Apply moisturizing lotion as directed Prim Dressing: KerraCel Ag Gelling Fiber Dressing, 4x5 in (silver alginate) 1 x Per Week/7 Days ORSON, RHO ([id number]) [id number] Page 11 of 14 Discharge Instructions: Apply silver alginate to wound bed as instructed Secondary Dressing: ABD Pad, 5x9 1 x Per Week/7 Days Discharge Instructions: Apply over primary dressing as directed. Secondary Dressing: Woven Gauze Sponge, Non-Sterile 4x4 in 1 x Per Week/7 Days Discharge Instructions: Apply over primary dressing as directed. Com pression Wrap: FourPress (4 layer compression wrap) 1 x Per Week/7 Days Discharge Instructions: Apply four layer compression as directed. May also use Miliken CoFlex 2 layer compression system as alternative. WOUND #6: - T - Web between 1st and 2nd Wound Laterality: Right oe Cleanser: Wound Cleanser (Generic) 1 x Per Day/15 Days Discharge Instructions: Cleanse the wound with wound cleanser prior to applying a clean dressing using gauze sponges, not tissue or cotton balls. Topical: Ketoconazole Cream 2% 1 x Per Day/15 Days Discharge Instructions: Apply Ketoconazole as directed. Use over the counter antifungal powder at home Prim Dressing: KerraCel Ag Gelling Fiber Dressing, 4x5 in (silver alginate) (Generic) 1 x Per Day/15 Days ary Discharge Instructions: Apply silver alginate to wound bed as instructed Secondary  Dressing: Woven Gauze Sponge, Non-Sterile 4x4 in (Generic) 1 x Per Day/15 Days Discharge Instructions: Apply over primary dressing as directed. Secured With: Child psychotherapist, Sterile 2x75 (in/in) (Generic) 1 x Per Day/15 Days Discharge Instructions: Secure with stretch gauze as directed. Secured With: 95M Medipore H Soft Cloth Surgical T ape, 4 x 10 (in/yd) (Generic) 1 x Per Day/15 Days Discharge Instructions: Secure with tape as directed. Secured With: [employer] Size 2, 10 (yds) (Generic) 1 x Per Day/15 Days WOUND #7: - Ankle Wound Laterality: Right, Lateral Cleanser: Soap and Water 1 x Per Week/7 Days Discharge Instructions: May shower and wash wound with dial antibacterial soap and water prior to dressing change. Cleanser: Wound Cleanser 1 x Per Week/7 Days Discharge Instructions: Cleanse the wound with wound cleanser prior to applying a clean dressing using gauze sponges, not tissue or cotton balls. Peri-Wound Care: Sween Lotion (Moisturizing lotion) 1 x Per Week/7 Days Discharge Instructions: Apply moisturizing lotion as directed Prim Dressing: KerraCel Ag Gelling Fiber Dressing, 4x5 in (silver alginate) 1 x Per Week/7 Days ary Discharge Instructions: Apply silver alginate to wound bed as instructed Secondary Dressing: ABD Pad, 5x9 1 x Per Week/7 Days Discharge Instructions: Apply over primary  dressing as directed. Secondary Dressing: Woven Gauze Sponge, Non-Sterile 4x4 in 1 x Per Week/7 Days Discharge Instructions: Apply over primary dressing as directed. Com pression Wrap: FourPress (4 layer compression wrap) 1 x Per Week/7 Days Discharge Instructions: Apply four layer compression as directed. May also use Miliken CoFlex 2 layer compression system as alternative. WOUND #8: - Lower Leg Wound Laterality: Right, Posterior Cleanser: Soap and Water 1 x Per Week/7 Days Discharge Instructions: May shower and wash wound with dial antibacterial soap and water prior to  dressing change. Cleanser: Wound Cleanser 1 x Per Week/7 Days Discharge Instructions: Cleanse the wound with wound cleanser prior to applying a clean dressing using gauze sponges, not tissue or cotton balls. Peri-Wound Care: Sween Lotion (Moisturizing lotion) 1 x Per Week/7 Days Discharge Instructions: Apply moisturizing lotion as directed Prim Dressing: KerraCel Ag Gelling Fiber Dressing, 4x5 in (silver alginate) 1 x Per Week/7 Days ary Discharge Instructions: Apply silver alginate to wound bed as instructed Secondary Dressing: ABD Pad, 5x9 1 x Per Week/7 Days Discharge Instructions: Apply over primary dressing as directed. Secondary Dressing: Woven Gauze Sponge, Non-Sterile 4x4 in 1 x Per Week/7 Days Discharge Instructions: Apply over primary dressing as directed. Com pression Wrap: FourPress (4 layer compression wrap) 1 x Per Week/7 Days Discharge Instructions: Apply four layer compression as directed. May also use Miliken CoFlex 2 layer compression system as alternative. 02/09/2022: On the right medial lower leg wound, there is significant slough accumulation. The right lateral ankle wound is gray and the patient reports an increase in pain in this site. The area between his first and second toes has dried out considerably. The more proximal ulcer on his right posterior leg has some slough buildup over a fibrotic surface. Edema control is markedly improved. I used a curette to debride slough and nonviable subcutaneous tissue from the right lateral ankle wound and the right medial lower leg wound. I debrided slough from the more proximal right posterior leg wound. No debridement was necessary between his first and second toes. Based on the appearance of the ankle wound, I did take a culture. We will apply topical gentamicin to his wounds with silver alginate and 4-layer compression. Once his culture data return, I will tailor antibiotic therapy appropriately. Follow-up in 1 week. Electronic  Signature(s) Signed: 02/09/2022 2:58:10 PM By: Fredirick Maudlin MD FACS Signed: 02/09/2022 5:08:53 PM By: Baruch Gouty RN, BSN Previous Signature: 02/09/2022 11:36:27 AM Version By: Fredirick Maudlin MD FACS Entered By: Baruch Gouty on 02/09/2022 11:51:54 -------------------------------------------------------------------------------- HxROS Details Patient Name: Date of Service: Marvin Atlas, PA UL J. 02/09/2022 10:30 A M Medical Record Number: [id number] Patient Account Number: [id number] Date of Birth/Sex: Treating RN: 04/14/1960 (61 y.o. M) Primary Care Provider: [name] Other Clinician: Referring Provider: Treating Provider/Extender: Chuck Hint, Linton Rump ([id number]) [id number] Page 12 of 14 Weeks in Treatment: 0 Information Obtained From Patient Eyes Medical History: Negative for: Cataracts; Glaucoma; Optic Neuritis Ear/Nose/Mouth/Throat Medical History: Negative for: Chronic sinus problems/congestion; Middle ear problems Hematologic/Lymphatic Medical History: Positive for: Anemia Negative for: Hemophilia; Human Immunodeficiency Virus; Lymphedema; Sickle Cell Disease Respiratory Medical History: Positive for: Sleep Apnea Negative for: Aspiration; Asthma; Chronic Obstructive Pulmonary Disease (COPD); Pneumothorax; Tuberculosis Cardiovascular Medical History: Positive for: Congestive Heart Failure; Coronary Artery Disease; Peripheral Venous Disease Negative for: Angina; Arrhythmia; Deep Vein Thrombosis; Hypertension; Hypotension; Myocardial Infarction; Peripheral Arterial Disease; Phlebitis; Vasculitis Gastrointestinal Medical History: Negative for: Cirrhosis ; Colitis; Crohns; Hepatitis A; Hepatitis B; Hepatitis C Past Medical  History Notes: gastric bypass Endocrine Medical History: Negative for: Type I Diabetes; Type II Diabetes Genitourinary Medical History: Negative for: End Stage Renal  Disease Immunological Medical History: Negative for: Lupus Erythematosus; Raynauds; Scleroderma Integumentary (Skin) Medical History: Negative for: History of Burn Musculoskeletal Medical History: Positive for: Osteoarthritis Negative for: Gout; Rheumatoid Arthritis; Osteomyelitis Neurologic Medical History: Negative for: Dementia; Neuropathy; Quadriplegia; Paraplegia; Seizure Disorder Oncologic Medical History: Negative for: Received Chemotherapy; Received Radiation Psychiatric Medical History: JOHNTAVIOUS, FRANCOM ([id number]) [id number] Page 13 of 14 Negative for: Anorexia/bulimia; Confinement Anxiety Immunizations Pneumococcal Vaccine: Received Pneumococcal Vaccination: Yes Received Pneumococcal Vaccination On or After 60th Birthday: Yes Implantable Devices None Hospitalization / Surgery History Type of Hospitalization/Surgery right hip nailing 10/17/2019 05/17/2018 nephrolithotomy left 01/29/2017 right lymph noes biopsy cystoscopy 2017, 2018 2017 gastric bypass surgery Family and Social History Cancer: Yes; Diabetes: Yes - Father; Heart Disease: No; Hereditary Spherocytosis: No; Hypertension: Yes - Father; Kidney Disease: No; Lung Disease: No; Seizures: No; Stroke: No; Thyroid Problems: No; Tuberculosis: No; Current every day smoker; Marital Status - Divorced; Alcohol Use: Never; Drug Use: Prior History; Caffeine Use: Rarely; Financial Concerns: No; Food, Clothing or Shelter Needs: No; Support System Lacking: No; Transportation Concerns: No Engineer, maintenance) Signed: 02/09/2022 2:58:10 PM By: Fredirick Maudlin MD FACS Entered By: Fredirick Maudlin on 02/09/2022 11:32:42 -------------------------------------------------------------------------------- SuperBill Details Patient Name: Date of Service: Marvin Atlas, PA UL J. 02/09/2022 Medical Record Number: [id number] Patient Account Number: [id number] Date of Birth/Sex: Treating RN: October 14, 1960 (61 y.o.  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 Facility Procedures : CPT4 Code: [id number] Description: 11042 - DEB SUBQ TISSUE 20 SQ CM/< ICD-10 Diagnosis Description L97.812 Non-pressure chronic ulcer of other part of right lower leg with fat layer expos L97.312 Non-pressure chronic ulcer of right ankle with fat layer exposed Modifier: ed Quantity: 1 : CPT4 Code: [id number] Description: 17494 - DEBRIDE WOUND 1ST 20 SQ CM OR < ICD-10 Diagnosis Description L97.812 Non-pressure chronic ulcer of other part of right lower leg with fat layer expos Modifier: ed Quantity: 1 Physician Procedures : CPT4 Code Description Modifier [id number] - WC PHYS LEVEL 4 - EST PT 25 ICD-10 Diagnosis Description TAJON, MORING ([id number]) [id number] L97.812 Non-pressure chronic ulcer of other part of right lower leg with fat layer  exposed 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 I87.331 Chronic venous hypertension (idiopathic) with ulcer and inflammation of right  lower extremity Quantity: 1 7.pdf Page 14 of 14 : [id number] 11042 - WC PHYS SUBQ TISS 20 SQ CM 1 ICD-10 Diagnosis Description 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

## 2022-02-10 NOTE — Progress Notes (Signed)
ALGER, KERSTEIN ([id number]) [id number] Page 1 of 14 Visit Report for 02/09/2022 Arrival Information Details Patient Name: Date of Service: [name], [place] J. 02/09/2022 10:30 A M Medical Record Number: [id number] Patient Account Number: [id number] Date of Birth/Sex: Treating RN: [birth year] (61 y.o. M) Primary Care Briellah Baik: [name] Other Clinician: Referring Talonda Artist: Treating Linzie Criss/Extender: Chuck Hint, JO HN Weeks in Treatment: 0 Visit Information History Since Last Visit All ordered tests and consults were completed: No Patient Arrived: [name] Added or deleted any medications: No Arrival Time: 10:28 Any new allergies or adverse reactions: No Transfer Assistance: None Had a fall or experienced change in No Patient Identification Verified: Yes activities of daily living that may affect Secondary Verification Process Completed: Yes risk of falls: Patient Requires Transmission-Based Precautions: No Signs or symptoms of abuse/neglect since last visito No Patient Has Alerts: No Hospitalized since last visit: No Implantable device outside of the clinic excluding No cellular tissue based products placed in the center since last visit: Pain Present Now: No Electronic Signature(s) Signed: 02/09/2022 1:41:38 PM By: Worthy Rancher Entered By: Worthy Rancher on 02/09/2022 10:29:11 -------------------------------------------------------------------------------- Compression Therapy Details Patient Name: Date of Service: [name], [name] 02/09/2022 10:30 A M Medical Record Number: [id number] Patient Account Number: [id number] Date of Birth/Sex: Treating RN: 02/18/1961 (61 y.o. [name] Primary Care Ladonya Jerkins: [name] Other Clinician: Referring Tomesha Sargent: Treating Geraldy Akridge/Extender: Chuck Hint, JO HN Weeks in Treatment: 0 Compression Therapy Performed for Wound Assessment: Wound #5 Right,Lateral Lower  Leg Performed By: Clinician Baruch Gouty, RN Compression Type: Four Layer Post Procedure Diagnosis Same as Pre-procedure Electronic Signature(s) Signed: 02/09/2022 5:08:53 PM By: Baruch Gouty RN, BSN Entered By: Baruch Gouty on 02/09/2022 11:18:20 Adah Salvage ([id number]) [id number] Page 2 of 14 -------------------------------------------------------------------------------- Encounter Discharge Information Details Patient Name: Date of Service: [name], [place] 02/09/2022 10:30 A M Medical Record Number: [id number] Patient Account Number: [id number] Date of Birth/Sex: Treating RN: 1961/01/07 (60 y.o. [name] Primary Care Lianne Carreto: [name] Other Clinician: Referring Kelce Bouton: Treating Tapanga Ottaway/Extender: Chuck Hint, JO HN Weeks in Treatment: 0 Encounter Discharge Information Items Post Procedure Vitals Discharge Condition: Stable Temperature (F): 97.9 Ambulatory Status: Cane Pulse (bpm): 71 Discharge Destination: Home Respiratory Rate (breaths/min): 18 Transportation: Private Auto Blood Pressure (mmHg): 111/73 Accompanied By: father Schedule Follow-up Appointment: Yes Clinical Summary of Care: Patient Declined Electronic Signature(s) Signed: 02/09/2022 5:08:53 PM By: Baruch Gouty RN, BSN Entered By: Baruch Gouty on 02/09/2022 11:53:15 -------------------------------------------------------------------------------- Lower Extremity Assessment Details Patient Name: Date of Service: [name], [place] J. 02/09/2022 10:30 A M Medical Record Number: [id number] Patient Account Number: [id number] Date of Birth/Sex: Treating RN: 1961-03-30 (61 y.o. [name] Primary Care Rashae Rother: [name] Other Clinician: Referring Lanna Labella: Treating Sachi Boulay/Extender: Chuck Hint, JO HN Weeks in Treatment: 0 Edema Assessment Assessed: [Left: No] [Right: No] Edema: [Left: Ye] [Right:  s] Calf Left: Right: Point of Measurement: 42.5 cm From Medial Instep 35.5 cm Ankle Left: Right: Point of Measurement: 11.5 cm From Medial Instep 24 cm Vascular Assessment Pulses: Dorsalis Pedis Palpable: [Right:Yes] Electronic Signature(s) Signed: 02/09/2022 5:08:53 PM By: Baruch Gouty RN, BSN Entered By: Baruch Gouty on 02/09/2022 10:55:52 Multi Wound Chart Details -------------------------------------------------------------------------------- Adah Salvage ([id number]) [id number] Page 3 of 14 Patient Name: Date of Service: [name], [place] J. 02/09/2022 10:30 A M Medical Record Number: [id number] Patient Account Number: [id number] Date of Birth/Sex: Treating  RN: Jul 11, 1960 (61 y.o. M) Primary Care Odyn Turko: [name] Other Clinician: Referring Erial Fikes: Treating Azizi Bally/Extender: Chuck Hint, JO HN Weeks in Treatment: 0 Vital Signs Height(in): Pulse(bpm): 59 Weight(lbs): Blood Pressure(mmHg): 111/73 Body Mass Index(BMI): Temperature(F): 97.9 Respiratory Rate(breaths/min): 20 Wound Assessments Wound Number: '5 6 7 '$ Photos: Right, Lateral Lower Leg Right T - Web between 1st and 2nd Right, Lateral Ankle oe Wound Location: Trauma Gradually Appeared Gradually Appeared Wounding Event: Venous Leg Ulcer Lymphedema Venous Leg Ulcer Primary Etiology: Anemia, Sleep Apnea, Congestive Anemia, Sleep Apnea, Congestive Anemia, Sleep Apnea, Congestive Comorbid History: Heart Failure, Coronary Artery Heart Failure, Coronary Artery Heart Failure, Coronary Artery Disease, Peripheral Venous Disease, Disease, Peripheral Venous Disease, Disease, Peripheral Venous Disease, Osteoarthritis Osteoarthritis Osteoarthritis 02/03/2022 01/11/2022 01/11/2022 Date Acquired: 0 0 0 Weeks of Treatment: Open Open Open Wound Status: No No No Wound Recurrence: 3 N/A N/A Clustered Quantity: 1.5x1x0.1 1.3x1.4x0.1 1.7x1.3x0.2 Measurements L x W  x D (cm) 1.178 1.429 1.736 A (cm) : rea 0.118 0.143 0.347 Volume (cm) : 98.70% 77.30% 18.20% % Reduction in A rea: 99.30% 88.60% 18.20% % Reduction in Volume: Full Thickness With Exposed Support Full Thickness With Exposed Support Full Thickness With Exposed Support Classification: Structures Structures Structures Medium Medium Medium Exudate A mount: Serosanguineous Serosanguineous Serosanguineous Exudate Type: red, brown red, brown red, brown Exudate Color: Distinct, outline attached Distinct, outline attached Distinct, outline attached Wound Margin: Large (67-100%) Medium (34-66%) Medium (34-66%) Granulation A mount: Red Pink Red, Pink Granulation Quality: Small (1-33%) Medium (34-66%) Medium (34-66%) Necrotic A mount: Fat Layer (Subcutaneous Tissue): Yes Fat Layer (Subcutaneous Tissue): Yes Fat Layer (Subcutaneous Tissue): Yes Exposed Structures: Fascia: No Fascia: No Fascia: No Tendon: No Tendon: No Tendon: No Muscle: No Muscle: No Muscle: No Joint: No Joint: No Joint: No Bone: No Bone: No Bone: No None Small (1-33%) None Epithelialization: Debridement - Selective/Open Wound N/A Debridement - Excisional Debridement: Pre-procedure Verification/Time Out 11:15 N/A 11:15 Taken: Lidocaine 4% Topical Solution N/A Lidocaine 4% Topical Solution Pain Control: Slough N/A Subcutaneous, Slough Tissue Debrided: Non-Viable Tissue N/A Skin/Subcutaneous Tissue Level: 1.5 N/A 2.21 Debridement A (sq cm): rea Curette N/A Curette Instrument: Minimum N/A Minimum Bleeding: Pressure N/A Pressure Hemostasis A chieved: 5 N/A 8 Procedural Pain: 3 N/A 5 Post Procedural Pain: Procedure was tolerated well N/A Procedure was tolerated well Debridement Treatment Response: 1.5x1x0.1 N/A 1.7x1.3x0.1 Post Debridement Measurements L x W x D (cm) 0.118 N/A 0.174 Post Debridement Volume: (cm) Excoriation: No Excoriation: No Excoriation: No Periwound Skin  Texture: Induration: No Induration: No Induration: No Callus: No Callus: No Callus: No Crepitus: No Crepitus: No Crepitus: No Rash: No Rash: No Rash: No Scarring: No Scarring: No Scarring: No Maceration: No Maceration: Yes Maceration: No Periwound Skin Moisture: Dry/Scaly: No Dry/Scaly: No Dry/ScalySHELDEN, RABORN ([id number]) [id number] Page 4 of 14 Hemosiderin Staining: Yes Atrophie Blanche: No Atrophie Blanche: No Periwound Skin Color: Atrophie Blanche: No Cyanosis: No Cyanosis: No Cyanosis: No Ecchymosis: No Ecchymosis: No Ecchymosis: No Erythema: No Erythema: No Erythema: No Hemosiderin Staining: No Hemosiderin Staining: No Mottled: No Mottled: No Mottled: No Pallor: No Pallor: No Pallor: No Rubor: No Rubor: No Rubor: No No Abnormality No Abnormality No Abnormality Temperature: Yes Yes Yes Tenderness on Palpation: Compression Therapy N/A Debridement Procedures Performed: Debridement Wound Number: 8 N/A N/A Photos: N/A N/A Right, Posterior Lower Leg N/A N/A Wound Location: Gradually Appeared N/A N/A Wounding Event: Venous Leg Ulcer N/A N/A Primary Etiology: Anemia, Sleep Apnea, Congestive N/A N/A Comorbid History: Heart  Failure, Coronary Artery Disease, Peripheral Venous Disease, Osteoarthritis 01/11/2021 N/A N/A Date Acquired: 0 N/A N/A Weeks of Treatment: Open N/A N/A Wound Status: No N/A N/A Wound Recurrence: N/A N/A N/A Clustered Quantity: 2.8x1.4x0.1 N/A N/A Measurements L x W x D (cm) 3.079 N/A N/A A (cm) : rea 0.308 N/A N/A Volume (cm) : N/A N/A N/A % Reduction in A rea: N/A N/A N/A % Reduction in Volume: Full Thickness Without Exposed N/A N/A Classification: Support Structures Medium N/A N/A Exudate A mount: Serosanguineous N/A N/A Exudate Type: red, brown N/A N/A Exudate Color: Distinct, outline attached N/A N/A Wound Margin: Large (67-100%) N/A N/A Granulation A  mount: Red N/A N/A Granulation Quality: Small (1-33%) N/A N/A Necrotic A mount: Fat Layer (Subcutaneous Tissue): Yes N/A N/A Exposed Structures: Fascia: No Tendon: No Muscle: No Joint: No Bone: No None N/A N/A Epithelialization: Debridement - Excisional N/A N/A Debridement: Pre-procedure Verification/Time Out 11:15 N/A N/A Taken: Lidocaine 4% Topical Solution N/A N/A Pain Control: Subcutaneous, Slough N/A N/A Tissue Debrided: Skin/Subcutaneous Tissue N/A N/A Level: 3.92 N/A N/A Debridement A (sq cm): rea Curette N/A N/A Instrument: Minimum N/A N/A Bleeding: Pressure N/A N/A Hemostasis A chieved: 5 N/A N/A Procedural Pain: 3 N/A N/A Post Procedural Pain: Procedure was tolerated well N/A N/A Debridement Treatment Response: 2.8x1.4x0.1 N/A N/A Post Debridement Measurements L x W x D (cm) 0.308 N/A N/A Post Debridement Volume: (cm) No Abnormalities Noted N/A N/A Periwound Skin Texture: Maceration: No N/A N/A Periwound Skin Moisture: Dry/Scaly: No Hemosiderin Staining: Yes N/A N/A Periwound Skin Color: No Abnormality N/A N/A Temperature: Yes N/A N/A Tenderness on Palpation: Debridement N/A N/A Procedures Performed: Treatment Notes Electronic Signature(s) JALYNN, BETZOLD ([id number]) [id number] Page 5 of 14 Signed: 02/09/2022 11:30:53 AM By: Fredirick Maudlin MD FACS Entered By: Fredirick Maudlin on 02/09/2022 11:30:53 -------------------------------------------------------------------------------- Multi-Disciplinary Care Plan Details Patient Name: Date of Service: [name], [place]. 02/09/2022 10:30 A M Medical Record Number: [id number] Patient Account Number: [id number] Date of Birth/Sex: Treating RN: [birth year] (61 y.o. [name] Primary Care Malu Pellegrini: [name] Other Clinician: Referring Sanyah Molnar: Treating Raynette Arras/Extender: Osborne Oman HN Weeks in Treatment: 0 Multidisciplinary Care Plan  reviewed with physician Active Inactive Venous Leg Ulcer Nursing Diagnoses: Actual venous Insuffiency (use after diagnosis is confirmed) Knowledge deficit related to disease process and management Potential for venous Insuffiency (use before diagnosis confirmed) Goals: Non-invasive venous studies are completed as ordered Date Initiated: 02/03/2022 Target Resolution Date: 02/13/2022 Goal Status: Active Patient will maintain optimal edema control Date Initiated: 02/03/2022 Target Resolution Date: 02/14/2022 Goal Status: Active Patient/caregiver will verbalize understanding of disease process and disease management Date Initiated: 02/03/2022 Target Resolution Date: 02/13/2022 Goal Status: Active Interventions: Assess peripheral edema status every visit. Compression as ordered Provide education on venous insufficiency Notes: Wound/Skin Impairment Nursing Diagnoses: Impaired tissue integrity Knowledge deficit related to ulceration/compromised skin integrity Goals: Patient will have a decrease in wound volume by X% from date: (specify in notes) Date Initiated: 02/03/2022 Target Resolution Date: 02/12/2022 Goal Status: Active Patient/caregiver will verbalize understanding of skin care regimen Date Initiated: 02/03/2022 Target Resolution Date: 02/13/2022 Goal Status: Active Ulcer/skin breakdown will have a volume reduction of 30% by week 4 Date Initiated: 02/03/2022 Target Resolution Date: 02/14/2022 Goal Status: Active Interventions: Assess patient/caregiver ability to obtain necessary supplies Assess patient/caregiver ability to perform ulcer/skin care regimen upon admission and as needed Assess ulceration(s) every visit Notes: Electronic Signature(s) ADRIEN, SHANKAR ([id number]) [id number] Page 6 of 14 Signed: 02/09/2022 5:08:53  PM By: Baruch Gouty RN, BSN Entered By: Baruch Gouty on 02/09/2022  11:13:11 -------------------------------------------------------------------------------- Pain Assessment Details Patient Name: Date of Service: [name], [name] 02/09/2022 10:30 A M Medical Record Number: [id number] Patient Account Number: [id number] Date of Birth/Sex: Treating RN: [birth year] (61 y.o. M) Primary Care Rudransh Bellanca: [name] Other Clinician: Referring Jep Dyas: Treating Aloys Hupfer/Extender: Chuck Hint, JO HN Weeks in Treatment: 0 Active Problems Location of Pain Severity and Description of Pain Patient Has Paino Yes Site Locations Pain Location: Pain in Ulcers With Dressing Change: Yes Duration of the Pain. Constant / Intermittento Intermittent Rate the pain. Current Pain Level: 5 Worst Pain Level: 10 Least Pain Level: 0 Tolerable Pain Level: 5 Character of Pain Describe the Pain: Burning Pain Management and Medication Current Pain Management: Medication: Yes Is the Current Pain Management Adequate: Adequate How does your wound impact your activities of daily livingo Sleep: No Bathing: No Appetite: No Relationship With Others: No Bladder Continence: No Emotions: No Bowel Continence: No Work: No Toileting: No Drive: No Dressing: No Hobbies: No Engineer, maintenance) Signed: 02/09/2022 5:08:53 PM By: Baruch Gouty RN, BSN Entered By: Baruch Gouty on 02/09/2022 10:54:50 -------------------------------------------------------------------------------- Patient/Caregiver Education Details Patient Name: Date of Service: [name], [place]. 10/30/2023andnbsp10:30 A M Medical Record Number: [id number] Patient Account Number: [id number] RUSSEL, MORAIN ([id number]) [id number] Page 7 of 14 Date of Birth/Gender: Treating RN: 16-Jul-1960 (61 y.o. [name] Primary Care Physician: [name] Other Clinician: Referring Physician: Treating Physician/Extender: Osborne Oman HN Weeks  in Treatment: 0 Education Assessment Education Provided To: Patient Education Topics Provided Venous: Methods: Explain/Verbal Responses: Reinforcements needed, State content correctly Wound Debridement: Methods: Explain/Verbal Responses: Reinforcements needed, State content correctly Wound/Skin Impairment: Methods: Explain/Verbal Responses: Reinforcements needed, State content correctly Electronic Signature(s) Signed: 02/09/2022 5:08:53 PM By: Baruch Gouty RN, BSN Entered By: Baruch Gouty on 02/09/2022 11:13:32 -------------------------------------------------------------------------------- Wound Assessment Details Patient Name: Date of Service: [name], [place]. 02/09/2022 10:30 A M Medical Record Number: [id number] Patient Account Number: [id number] Date of Birth/Sex: Treating RN: January 10, 1961 (61 y.o. [name] Primary Care Tiannah Greenly: [name] Other Clinician: Referring Avelyn Touch: Treating Kierre Hintz/Extender: Chuck Hint, JO HN Weeks in Treatment: 0 Wound Status Wound Number: 5 Primary Venous Leg Ulcer Etiology: Wound Location: Right, Lateral Lower Leg Wound Open Wounding Event: Trauma Status: Date Acquired: 02/03/2022 Comorbid Anemia, Sleep Apnea, Congestive Heart Failure, Coronary Artery Weeks Of Treatment: 0 History: Disease, Peripheral Venous Disease, Osteoarthritis Clustered Wound: No Photos Wound Measurements Length: (cm) 1.5 Width: (cm) 1 Depth: (cm) 0.1 Clustered Quantity: 3 YUEPHENG, SCHALLER ([id number]) Area: (cm) 1.178 Volume: (cm) 0.118 % Reduction in Area: 98.7% % Reduction in Volume: 99.3% Epithelialization: None Tunneling: No [id number] Page 8 of 14 Undermining: No Wound Description Classification: Full Thickness With Exposed Support Structures Wound Margin: Distinct, outline attached Exudate Amount: Medium Exudate Type: Serosanguineous Exudate Color: red, brown Foul Odor After  Cleansing: No Slough/Fibrino Yes Wound Bed Granulation Amount: Large (67-100%) Exposed Structure Granulation Quality: Red Fascia Exposed: No Necrotic Amount: Small (1-33%) Fat Layer (Subcutaneous Tissue) Exposed: Yes Necrotic Quality: Adherent Slough Tendon Exposed: No Muscle Exposed: No Joint Exposed: No Bone Exposed: No Periwound Skin Texture Texture Color No Abnormalities Noted: No No Abnormalities Noted: No Callus: No Atrophie Blanche: No Crepitus: No Cyanosis: No Excoriation: No Ecchymosis: No Induration: No Erythema: No Rash: No Hemosiderin Staining: Yes Scarring: No Mottled: No Pallor: No Moisture Rubor: No No Abnormalities Noted:  No Dry / Scaly: No Temperature / Pain Maceration: No Temperature: No Abnormality Tenderness on Palpation: Yes Treatment Notes Wound #5 (Lower Leg) Wound Laterality: Right, Lateral Cleanser Soap and Water Discharge Instruction: May shower and wash wound with dial antibacterial soap and water prior to dressing change. Wound Cleanser Discharge Instruction: Cleanse the wound with wound cleanser prior to applying a clean dressing using gauze sponges, not tissue or cotton balls. Peri-Wound Care Sween Lotion (Moisturizing lotion) Discharge Instruction: Apply moisturizing lotion as directed Topical Primary Dressing KerraCel Ag Gelling Fiber Dressing, 4x5 in (silver alginate) Discharge Instruction: Apply silver alginate to wound bed as instructed Secondary Dressing ABD Pad, 5x9 Discharge Instruction: Apply over primary dressing as directed. Woven Gauze Sponge, Non-Sterile 4x4 in Discharge Instruction: Apply over primary dressing as directed. Secured With Compression Wrap FourPress (4 layer compression wrap) Discharge Instruction: Apply four layer compression as directed. May also use Miliken CoFlex 2 layer compression system as alternative. Compression Stockings Add-Ons Electronic Signature(s) Signed: 02/09/2022 5:08:53 PM By:  Baruch Gouty RN, BSN Entered By: Baruch Gouty on 02/09/2022 11:06:20 Adah Salvage ([id number]) [id number] Page 9 of 14 -------------------------------------------------------------------------------- Wound Assessment Details Patient Name: Date of Service: [name], [place] 02/09/2022 10:30 A M Medical Record Number: [id number] Patient Account Number: [id number] Date of Birth/Sex: Treating RN: January 10, 1961 (61 y.o. [name] Primary Care Kyliee Ortego: [name] Other Clinician: Referring Tosha Belgarde: Treating Carita Sollars/Extender: Chuck Hint, JO HN Weeks in Treatment: 0 Wound Status Wound Number: 6 Primary Lymphedema Etiology: Wound Location: Right T - Web between 1st and 2nd oe Wound Open Wounding Event: Gradually Appeared Status: Date Acquired: 01/11/2022 Comorbid Anemia, Sleep Apnea, Congestive Heart Failure, Coronary Artery Weeks Of Treatment: 0 History: Disease, Peripheral Venous Disease, Osteoarthritis Clustered Wound: No Photos Wound Measurements Length: (cm) 1.3 Width: (cm) 1.4 Depth: (cm) 0.1 Area: (cm) 1.429 Volume: (cm) 0.143 % Reduction in Area: 77.3% % Reduction in Volume: 88.6% Epithelialization: Small (1-33%) Tunneling: No Undermining: No Wound Description Classification: Full Thickness With Exposed Suppo Wound Margin: Distinct, outline attached Exudate Amount: Medium Exudate Type: Serosanguineous Exudate Color: red, brown rt Structures Foul Odor After Cleansing: No Slough/Fibrino Yes Wound Bed Granulation Amount: Medium (34-66%) Exposed Structure Granulation Quality: Pink Fascia Exposed: No Necrotic Amount: Medium (34-66%) Fat Layer (Subcutaneous Tissue) Exposed: Yes Necrotic Quality: Adherent Slough Tendon Exposed: No Muscle Exposed: No Joint Exposed: No Bone Exposed: No Periwound Skin Texture Texture Color No Abnormalities Noted: No No Abnormalities Noted: No Callus: No Atrophie  Blanche: No Crepitus: No Cyanosis: No Excoriation: No Ecchymosis: No Induration: No Erythema: No Rash: No Hemosiderin Staining: No Scarring: No Mottled: No Pallor: No Moisture Rubor: No No Abnormalities Noted: No Dry / Scaly: No Temperature / Pain SHAUNAK, KREIS ([id number]) [id number] Page 10 of 14 Maceration: Yes Temperature: No Abnormality Tenderness on Palpation: Yes Treatment Notes Wound #6 (Toe - Web between 1st and 2nd) Wound Laterality: Right Cleanser Wound Cleanser Discharge Instruction: Cleanse the wound with wound cleanser prior to applying a clean dressing using gauze sponges, not tissue or cotton balls. Peri-Wound Care Topical Ketoconazole Cream 2% Discharge Instruction: Apply Ketoconazole as directed. Use over the counter antifungal powder at home Primary Dressing KerraCel Ag Gelling Fiber Dressing, 4x5 in (silver alginate) Discharge Instruction: Apply silver alginate to wound bed as instructed Secondary Dressing Woven Gauze Sponge, Non-Sterile 4x4 in Discharge Instruction: Apply over primary dressing as directed. Secured With Conforming Stretch Gauze Bandage, Sterile 2x75 (in/in) Discharge Instruction: Secure with stretch gauze as directed. 30M Medipore  H Soft Cloth Surgical T ape, 4 x 10 (in/yd) Discharge Instruction: Secure with tape as directed. Stretch Net Size 2, 10 (yds) Compression Wrap Compression Stockings Add-Ons Electronic Signature(s) Signed: 02/09/2022 5:08:53 PM By: Baruch Gouty RN, BSN Entered By: Baruch Gouty on 02/09/2022 11:07:02 -------------------------------------------------------------------------------- Wound Assessment Details Patient Name: Date of Service: [name], [place]. 02/09/2022 10:30 A M Medical Record Number: [id number] Patient Account Number: [id number] Date of Birth/Sex: Treating RN: 12-16-1960 (61 y.o. M) Primary Care Hebe Merriwether: [name] Other Clinician: Referring  Keliyah Spillman: Treating Cailen Texeira/Extender: Chuck Hint, JO HN Weeks in Treatment: 0 Wound Status Wound Number: 7 Primary Venous Leg Ulcer Etiology: Wound Location: Right, Lateral Ankle Wound Open Wounding Event: Gradually Appeared Status: Date Acquired: 01/11/2022 Comorbid Anemia, Sleep Apnea, Congestive Heart Failure, Coronary Artery Weeks Of Treatment: 0 History: Disease, Peripheral Venous Disease, Osteoarthritis Clustered Wound: No Photos MILLIE, SHORB ([id number]) [id number] Page 11 of 14 Wound Measurements Length: (cm) 1.7 Width: (cm) 1.3 Depth: (cm) 0.2 Area: (cm) 1.736 Volume: (cm) 0.347 % Reduction in Area: 18.2% % Reduction in Volume: 18.2% Epithelialization: None Wound Description Classification: Full Thickness With Exposed Support Structures Wound Margin: Distinct, outline attached Exudate Amount: Medium Exudate Type: Serosanguineous Exudate Color: red, brown Foul Odor After Cleansing: No Slough/Fibrino Yes Wound Bed Granulation Amount: Medium (34-66%) Exposed Structure Granulation Quality: Red, Pink Fascia Exposed: No Necrotic Amount: Medium (34-66%) Fat Layer (Subcutaneous Tissue) Exposed: Yes Necrotic Quality: Adherent Slough Tendon Exposed: No Muscle Exposed: No Joint Exposed: No Bone Exposed: No Periwound Skin Texture Texture Color No Abnormalities Noted: No No Abnormalities Noted: No Callus: No Atrophie Blanche: No Crepitus: No Cyanosis: No Excoriation: No Ecchymosis: No Induration: No Erythema: No Rash: No Hemosiderin Staining: No Scarring: No Mottled: No Pallor: No Moisture Rubor: No No Abnormalities Noted: No Dry / Scaly: No Temperature / Pain Maceration: No Temperature: No Abnormality Tenderness on Palpation: Yes Treatment Notes Wound #7 (Ankle) Wound Laterality: Right, Lateral Cleanser Soap and Water Discharge Instruction: May shower and wash wound with dial antibacterial soap and  water prior to dressing change. Wound Cleanser Discharge Instruction: Cleanse the wound with wound cleanser prior to applying a clean dressing using gauze sponges, not tissue or cotton balls. Peri-Wound Care Sween Lotion (Moisturizing lotion) Discharge Instruction: Apply moisturizing lotion as directed Topical Primary Dressing KerraCel Ag Gelling Fiber Dressing, 4x5 in (silver alginate) Discharge Instruction: Apply silver alginate to wound bed as instructed Secondary Dressing ABD Pad, 5x9 Discharge Instruction: Apply over primary dressing as directed. MOE, GRACA ([id number]) [id number] Page 12 of 14 Woven Gauze Sponge, Non-Sterile 4x4 in Discharge Instruction: Apply over primary dressing as directed. Secured With Compression Wrap FourPress (4 layer compression wrap) Discharge Instruction: Apply four layer compression as directed. May also use Miliken CoFlex 2 layer compression system as alternative. Compression Stockings Add-Ons Electronic Signature(s) Signed: 02/09/2022 5:08:53 PM By: Baruch Gouty RN, BSN Entered By: Baruch Gouty on 02/09/2022 11:07:38 -------------------------------------------------------------------------------- Wound Assessment Details Patient Name: Date of Service: [name], PA UL J. 02/09/2022 10:30 A M Medical Record Number: [id number] Patient Account Number: [id number] Date of Birth/Sex: Treating RN: 1960/10/31 (61 y.o. [name] Primary Care Della Scrivener: [name] Other Clinician: Referring Lyndsie Wallman: Treating Siena Poehler/Extender: Chuck Hint, JO HN Weeks in Treatment: 0 Wound Status Wound Number: 8 Primary Venous Leg Ulcer Etiology: Wound Location: Right, Posterior Lower Leg Wound Open Wounding Event: Gradually Appeared Status: Date Acquired: 01/11/2021 Comorbid Anemia, Sleep Apnea, Congestive Heart Failure, Coronary Artery Weeks Of Treatment:  0 History: Disease, Peripheral Venous  Disease, Osteoarthritis Clustered Wound: No Photos Wound Measurements Length: (cm) 2.8 Width: (cm) 1.4 Depth: (cm) 0.1 Area: (cm) 3.079 Volume: (cm) 0.308 % Reduction in Area: % Reduction in Volume: Epithelialization: None Tunneling: No Undermining: No Wound Description Classification: Full Thickness Without Exposed Support Structures Wound Margin: Distinct, outline attached Exudate Amount: Medium Exudate Type: Serosanguineous Exudate Color: red, brown Foul Odor After Cleansing: No Slough/Fibrino Yes Wound Bed Granulation Amount: Large (67-100%) Exposed Structure Granulation Quality: Red Fascia Exposed: No Necrotic Amount: Small (1-33%) Fat Layer (Subcutaneous Tissue) Exposed: Yes ULYESS, MUTO ([id number]) [id number] Page 13 of 14 Necrotic Quality: Adherent Slough Tendon Exposed: No Muscle Exposed: No Joint Exposed: No Bone Exposed: No Periwound Skin Texture Texture Color No Abnormalities Noted: Yes No Abnormalities Noted: No Hemosiderin Staining: Yes Moisture No Abnormalities Noted: No Temperature / Pain Dry / Scaly: No Temperature: No Abnormality Maceration: No Tenderness on Palpation: Yes Treatment Notes Wound #8 (Lower Leg) Wound Laterality: Right, Posterior Cleanser Soap and Water Discharge Instruction: May shower and wash wound with dial antibacterial soap and water prior to dressing change. Wound Cleanser Discharge Instruction: Cleanse the wound with wound cleanser prior to applying a clean dressing using gauze sponges, not tissue or cotton balls. Peri-Wound Care Sween Lotion (Moisturizing lotion) Discharge Instruction: Apply moisturizing lotion as directed Topical Primary Dressing KerraCel Ag Gelling Fiber Dressing, 4x5 in (silver alginate) Discharge Instruction: Apply silver alginate to wound bed as instructed Secondary Dressing ABD Pad, 5x9 Discharge Instruction: Apply over primary dressing as directed. Woven Gauze  Sponge, Non-Sterile 4x4 in Discharge Instruction: Apply over primary dressing as directed. Secured With Compression Wrap FourPress (4 layer compression wrap) Discharge Instruction: Apply four layer compression as directed. May also use Miliken CoFlex 2 layer compression system as alternative. Compression Stockings Add-Ons Electronic Signature(s) Signed: 02/09/2022 5:08:53 PM By: Baruch Gouty RN, BSN Entered By: Baruch Gouty on 02/09/2022 11:08:12 -------------------------------------------------------------------------------- Vitals Details Patient Name: Date of Service: [name], PA UL J. 02/09/2022 10:30 A M Medical Record Number: [id number] Patient Account Number: [id number] Date of Birth/Sex: Treating RN: 1960-05-04 (61 y.o. M) Primary Care Rut Betterton: [name] Other Clinician: Referring Bernise Sylvain: Treating Emauri Krygier/Extender: Chuck Hint, JO HN Weeks in Treatment: 0 Vital Signs Time Taken: 10:20 Temperature (F): 97.9 Pulse (bpm): [address] ([id number]) [id number] Page 14 of 14 Respiratory Rate (breaths/min): 20 Blood Pressure (mmHg): 111/73 Reference Range: 80 - 120 mg / dl Electronic Signature(s) Signed: 02/09/2022 1:41:38 PM By: Worthy Rancher Entered By: Worthy Rancher on 02/09/2022 10:29:36

## 2022-02-11 ENCOUNTER — Encounter (HOSPITAL_BASED_OUTPATIENT_CLINIC_OR_DEPARTMENT_OTHER): Payer: PPO | Attending: General Surgery | Admitting: General Surgery

## 2022-02-11 DIAGNOSIS — L97312 Non-pressure chronic ulcer of right ankle with fat layer exposed: Secondary | ICD-10-CM | POA: Diagnosis not present

## 2022-02-11 DIAGNOSIS — I872 Venous insufficiency (chronic) (peripheral): Secondary | ICD-10-CM | POA: Insufficient documentation

## 2022-02-11 DIAGNOSIS — L97512 Non-pressure chronic ulcer of other part of right foot with fat layer exposed: Secondary | ICD-10-CM | POA: Diagnosis not present

## 2022-02-11 DIAGNOSIS — I87331 Chronic venous hypertension (idiopathic) with ulcer and inflammation of right lower extremity: Secondary | ICD-10-CM | POA: Diagnosis not present

## 2022-02-11 DIAGNOSIS — F17200 Nicotine dependence, unspecified, uncomplicated: Secondary | ICD-10-CM | POA: Diagnosis not present

## 2022-02-11 DIAGNOSIS — L97812 Non-pressure chronic ulcer of other part of right lower leg with fat layer exposed: Secondary | ICD-10-CM | POA: Insufficient documentation

## 2022-02-11 DIAGNOSIS — I890 Lymphedema, not elsewhere classified: Secondary | ICD-10-CM | POA: Insufficient documentation

## 2022-02-11 NOTE — Progress Notes (Signed)
DAQUANE, AGUILAR ([id number]) [id number] Page 1 of 8 Visit Report for 02/11/2022 Arrival Information Details Patient Name: Date of Service: [name], [place] J. 02/11/2022 12:30 PM Medical Record Number: [id number] Patient Account Number: [id number] Date of Birth/Sex: Treating RN: April 11, 1961 (61 y.o. [name] Primary Care Kazim Corrales: Evonnie Dawes, Jenny Reichmann Other Clinician: Referring Makinsey Pepitone: Treating Edis Huish/Extender: Evette Georges in Treatment: 1 Visit Information History Since Last Visit Added or deleted any medications: No Patient Arrived: [name] Any new allergies or adverse reactions: No Arrival Time: 16:53 Had a fall or experienced change in No Accompanied By: father activities of daily living that may affect Transfer Assistance: None risk of falls: Patient Identification Verified: Yes Signs or symptoms of abuse/neglect since last visito No Secondary Verification Process Completed: Yes Hospitalized since last visit: No Patient Requires Transmission-Based Precautions: No Implantable device outside of the clinic excluding No Patient Has Alerts: No cellular tissue based products placed in the center since last visit: Has Dressing in Place as Prescribed: Yes Has Compression in Place as Prescribed: Yes Pain Present Now: No Electronic Signature(s) Signed: 02/11/2022 5:00:23 PM By: Adline Peals Entered By: Adline Peals on 02/11/2022 16:53:32 -------------------------------------------------------------------------------- Compression Therapy Details Patient Name: Date of Service: Marvin Atlas, PA UL J. 02/11/2022 12:30 PM Medical Record Number: [id number] Patient Account Number: [id number] Date of Birth/Sex: Treating RN: 1960/11/14 (61 y.o. [name] Primary Care Tena Linebaugh: Evonnie Dawes, Jenny Reichmann Other Clinician: Referring Tiwatope Emmitt: Treating Drew Lips/Extender: Evette Georges in Treatment:  1 Compression Therapy Performed for Wound Assessment: Wound #5 Right,Lateral Lower Leg Performed By: Clinician Adline Peals, RN Compression Type: Four Layer Electronic Signature(s) Signed: 02/11/2022 5:00:23 PM By: Sabas Sous By: Adline Peals on 02/11/2022 16:54:40 -------------------------------------------------------------------------------- Encounter Discharge Information Details Patient Name: Date of Service: Marvin Atlas, PA UL J. 02/11/2022 12:30 PM Medical Record Number: [id number] Patient Account Number: [id number] [name], Marvin ([id number]) [id number] Page 2 of 8 Date of Birth/Sex: Treating RN: [birth year] (61 y.o. [name] Primary Care [name]: Other Clinician: Evonnie Dawes, Jenny Reichmann Referring Kylina Vultaggio: Treating Damieon Armendariz/Extender: Evette Georges in Treatment: 1 Encounter Discharge Information Items Discharge Condition: Stable Ambulatory Status: Cane Discharge Destination: Home Transportation: Private Auto Accompanied By: father Schedule Follow-up Appointment: Yes Clinical Summary of Care: Patient Declined Electronic Signature(s) Signed: 02/11/2022 5:00:23 PM By: Sabas Sous By: Adline Peals on 02/11/2022 16:55:08 -------------------------------------------------------------------------------- Patient/Caregiver Education Details Patient Name: Date of Service: Marvin Atlas, PA UL J. 11/1/2023andnbsp12:30 PM Medical Record Number: [id number] Patient Account Number: [id number] Date of Birth/Gender: Treating RN: 1960-07-20 (61 y.o. [name] Primary Care Physician: Evonnie Dawes, Jenny Reichmann Other Clinician: Referring Physician: Treating Physician/Extender: Evette Georges in Treatment: 1 Education Assessment Education Provided To: Patient Education Topics Provided Wound/Skin Impairment: Methods: Explain/Verbal Responses: Reinforcements needed,  State content correctly Electronic Signature(s) Signed: 02/11/2022 5:00:23 PM By: Adline Peals Entered By: Adline Peals on 02/11/2022 16:54:57 -------------------------------------------------------------------------------- Wound Assessment Details Patient Name: Date of Service: Marvin Atlas, PA UL J. 02/11/2022 12:30 PM Medical Record Number: [id number] Patient Account Number: [id number] Date of Birth/Sex: Treating RN: April 06, 1961 (61 y.o. [name] Primary Care [name]: Evonnie Dawes, Jenny Reichmann Other Clinician: Referring Parlee Amescua: Treating Sumie Remsen/Extender: Evette Georges in Treatment: 1 Wound Status Wound Number: 5 Primary Venous Leg Ulcer Etiology: Wound Location: Right, Lateral Lower Leg Wound Open Wounding Event: Trauma Status: Date Acquired: 02/03/2022 Marvin, [name] ([id number]) [id number].pdf Page 3 of 8 Date Acquired: 02/03/2022 Comorbid Anemia, Sleep Apnea,  Congestive Heart Failure, Coronary Artery Weeks Of Treatment: 1 History: Disease, Peripheral Venous Disease, Osteoarthritis Clustered Wound: No Wound Measurements Length: (cm) Width: (cm) Depth: (cm) Clustered Quantity: Area: (cm) Volume: (cm) 1.5 % Reduction in Area: 98.7% 1 % Reduction in Volume: 99.3% 0.1 Epithelialization: None 3 1.178 0.118 Wound Description Classification: Full Thickness With Exposed Suppo Wound Margin: Distinct, outline attached Exudate Amount: Medium Exudate Type: Serosanguineous Exudate Color: red, brown rt Structures Foul Odor After Cleansing: No Slough/Fibrino Yes Wound Bed Granulation Amount: Large (67-100%) Exposed Structure Granulation Quality: Red Fascia Exposed: No Necrotic Amount: Small (1-33%) Fat Layer (Subcutaneous Tissue) Exposed: Yes Necrotic Quality: Adherent Slough Tendon Exposed: No Muscle Exposed: No Joint Exposed: No Bone Exposed: No Periwound Skin Texture Texture Color No Abnormalities  Noted: No No Abnormalities Noted: No Callus: No Atrophie Blanche: No Crepitus: No Cyanosis: No Excoriation: No Ecchymosis: No Induration: No Erythema: No Rash: No Hemosiderin Staining: Yes Scarring: No Mottled: No Pallor: No Moisture Rubor: No No Abnormalities Noted: No Dry / Scaly: No Temperature / Pain Maceration: No Temperature: No Abnormality Tenderness on Palpation: Yes Treatment Notes Wound #5 (Lower Leg) Wound Laterality: Right, Lateral Cleanser Soap and Water Discharge Instruction: May shower and wash wound with dial antibacterial soap and water prior to dressing change. Wound Cleanser Discharge Instruction: Cleanse the wound with wound cleanser prior to applying a clean dressing using gauze sponges, not tissue or cotton balls. Peri-Wound Care Sween Lotion (Moisturizing lotion) Discharge Instruction: Apply moisturizing lotion as directed Topical Primary Dressing KerraCel Ag Gelling Fiber Dressing, 4x5 in (silver alginate) Discharge Instruction: Apply silver alginate to wound bed as instructed Secondary Dressing ABD Pad, 5x9 Discharge Instruction: Apply over primary dressing as directed. Woven Gauze Sponge, Non-Sterile 4x4 in Discharge Instruction: Apply over primary dressing as directed. Secured With Compression Wrap FourPress (4 layer compression wrap) NAZEER, ROMNEY ([id number]) [id number] Page 4 of 8 Discharge Instruction: Apply four layer compression as directed. May also use Miliken CoFlex 2 layer compression system as alternative. Compression Stockings Add-Ons Electronic Signature(s) Signed: 02/11/2022 5:00:23 PM By: Adline Peals Entered By: Adline Peals on 02/11/2022 16:54:08 -------------------------------------------------------------------------------- Wound Assessment Details Patient Name: Date of Service: Marvin Atlas, PA UL J. 02/11/2022 12:30 PM Medical Record Number: [id number] Patient Account Number:  [id number] Date of Birth/Sex: Treating RN: 26-Jun-1960 (62 y.o. [name] Primary Care Hermie Reagor: Evonnie Dawes, John Other Clinician: Referring Arlethia Basso: Treating Awais Cobarrubias/Extender: Evette Georges in Treatment: 1 Wound Status Wound Number: 6 Primary Lymphedema Etiology: Wound Location: Right T - Web between 1st and 2nd oe Wound Open Wounding Event: Gradually Appeared Status: Date Acquired: 01/11/2022 Comorbid Anemia, Sleep Apnea, Congestive Heart Failure, Coronary Artery Weeks Of Treatment: 1 History: Disease, Peripheral Venous Disease, Osteoarthritis Clustered Wound: No Wound Measurements Length: (cm) 1.3 Width: (cm) 1.4 Depth: (cm) 0.1 Area: (cm) 1.429 Volume: (cm) 0.143 % Reduction in Area: 77.3% % Reduction in Volume: 88.6% Epithelialization: Small (1-33%) Wound Description Classification: Full Thickness With Exposed Suppo Wound Margin: Distinct, outline attached Exudate Amount: Medium Exudate Type: Serosanguineous Exudate Color: red, brown rt Structures Foul Odor After Cleansing: No Slough/Fibrino Yes Wound Bed Granulation Amount: Medium (34-66%) Exposed Structure Granulation Quality: Pink Fascia Exposed: No Necrotic Amount: Medium (34-66%) Fat Layer (Subcutaneous Tissue) Exposed: Yes Necrotic Quality: Adherent Slough Tendon Exposed: No Muscle Exposed: No Joint Exposed: No Bone Exposed: No Periwound Skin Texture Texture Color No Abnormalities Noted: No No Abnormalities Noted: No Callus: No Atrophie Blanche: No Crepitus: No Cyanosis: No Excoriation: No Ecchymosis: No Induration: No Erythema:  No Rash: No Hemosiderin Staining: No Scarring: No Mottled: No Pallor: No Moisture Rubor: No No Abnormalities Noted: No Dry / Scaly: No Temperature / Pain Maceration: Yes Temperature: No Abnormality Tenderness on PalpationRAMA, MCCLINTOCK ([id number]) [id number] Page 5 of 8 Treatment  Notes Wound #6 (Toe - Web between 1st and 2nd) Wound Laterality: Right Cleanser Wound Cleanser Discharge Instruction: Cleanse the wound with wound cleanser prior to applying a clean dressing using gauze sponges, not tissue or cotton balls. Peri-Wound Care Topical Ketoconazole Cream 2% Discharge Instruction: Apply Ketoconazole as directed. Use over the counter antifungal powder at home Primary Dressing KerraCel Ag Gelling Fiber Dressing, 4x5 in (silver alginate) Discharge Instruction: Apply silver alginate to wound bed as instructed Secondary Dressing Woven Gauze Sponge, Non-Sterile 4x4 in Discharge Instruction: Apply over primary dressing as directed. Secured With Conforming Stretch Gauze Bandage, Sterile 2x75 (in/in) Discharge Instruction: Secure with stretch gauze as directed. 70M Medipore H Soft Cloth Surgical T ape, 4 x 10 (in/yd) Discharge Instruction: Secure with tape as directed. Stretch Net Size 2, 10 (yds) Compression Wrap Compression Stockings Add-Ons Electronic Signature(s) Signed: 02/11/2022 5:00:23 PM By: Adline Peals Entered By: Adline Peals on 02/11/2022 16:54:12 -------------------------------------------------------------------------------- Wound Assessment Details Patient Name: Date of Service: Marvin Atlas, PA UL J. 02/11/2022 12:30 PM Medical Record Number: [id number] Patient Account Number: [id number] Date of Birth/Sex: Treating RN: Jun 22, 1960 (61 y.o. [name] Primary Care Greycen Felter: Evonnie Dawes, Jenny Reichmann Other Clinician: Referring Razi Hickle: Treating Traeson Dusza/Extender: Evette Georges in Treatment: 1 Wound Status Wound Number: 7 Primary Venous Leg Ulcer Etiology: Wound Location: Right, Lateral Ankle Wound Open Wounding Event: Gradually Appeared Status: Date Acquired: 01/11/2022 Comorbid Anemia, Sleep Apnea, Congestive Heart Failure, Coronary Artery Weeks Of Treatment: 1 History: Disease, Peripheral Venous Disease,  Osteoarthritis Clustered Wound: No Wound Measurements Length: (cm) 1.7 Width: (cm) 1.2 Depth: (cm) 0.2 Area: (cm) 1.602 Volume: (cm) 0.32 % Reduction in Area: 24.5% % Reduction in Volume: 24.5% Epithelialization: None Wound Description Classification: Full Thickness With Exposed Support Structures Wound Margin: Distinct, outline attached TYMIER, LINDHOLM ([id number]) Exudate Amount: Medium Exudate Type: Serosanguineous Exudate Color: red, brown Foul Odor After Cleansing: No Slough/Fibrino Yes [id number] Page 6 of 8 Wound Bed Granulation Amount: Medium (34-66%) Exposed Structure Granulation Quality: Red, Pink Fascia Exposed: No Necrotic Amount: Medium (34-66%) Fat Layer (Subcutaneous Tissue) Exposed: Yes Necrotic Quality: Adherent Slough Tendon Exposed: No Muscle Exposed: No Joint Exposed: No Bone Exposed: No Periwound Skin Texture Texture Color No Abnormalities Noted: No No Abnormalities Noted: No Callus: No Atrophie Blanche: No Crepitus: No Cyanosis: No Excoriation: No Ecchymosis: No Induration: No Erythema: No Rash: No Hemosiderin Staining: No Scarring: No Mottled: No Pallor: No Moisture Rubor: No No Abnormalities Noted: No Dry / Scaly: No Temperature / Pain Maceration: No Temperature: No Abnormality Tenderness on Palpation: Yes Treatment Notes Wound #7 (Ankle) Wound Laterality: Right, Lateral Cleanser Soap and Water Discharge Instruction: May shower and wash wound with dial antibacterial soap and water prior to dressing change. Wound Cleanser Discharge Instruction: Cleanse the wound with wound cleanser prior to applying a clean dressing using gauze sponges, not tissue or cotton balls. Peri-Wound Care Sween Lotion (Moisturizing lotion) Discharge Instruction: Apply moisturizing lotion as directed Topical Primary Dressing KerraCel Ag Gelling Fiber Dressing, 4x5 in (silver alginate) Discharge Instruction: Apply silver  alginate to wound bed as instructed Secondary Dressing ABD Pad, 5x9 Discharge Instruction: Apply over primary dressing as directed. Woven Gauze Sponge, Non-Sterile 4x4 in Discharge Instruction: Apply over primary dressing as directed.  Secured With Compression Wrap FourPress (4 layer compression wrap) Discharge Instruction: Apply four layer compression as directed. May also use Miliken CoFlex 2 layer compression system as alternative. Compression Stockings Add-Ons Electronic Signature(s) Signed: 02/11/2022 5:00:23 PM By: Adline Peals Entered By: Adline Peals on 02/11/2022 16:54:16 Adah Salvage ([id number]) [id number] Page 7 of 8 -------------------------------------------------------------------------------- Wound Assessment Details Patient Name: Date of Service: [name], [place] J. 02/11/2022 12:30 PM Medical Record Number: [id number] Patient Account Number: [id number] Date of Birth/Sex: Treating RN: Apr 01, 1961 (61 y.o. [name] Primary Care Shafer Swamy: Evonnie Dawes, Jenny Reichmann Other Clinician: Referring Byran Bilotti: Treating Darrol Brandenburg/Extender: Evette Georges in Treatment: 1 Wound Status Wound Number: 8 Primary Venous Leg Ulcer Etiology: Wound Location: Right, Posterior Lower Leg Wound Open Wounding Event: Gradually Appeared Status: Date Acquired: 01/11/2021 Comorbid Anemia, Sleep Apnea, Congestive Heart Failure, Coronary Artery Weeks Of Treatment: 0 History: Disease, Peripheral Venous Disease, Osteoarthritis Clustered Wound: No Wound Measurements Length: (cm) 2.8 Width: (cm) 1.4 Depth: (cm) 0.1 Area: (cm) 3.079 Volume: (cm) 0.308 % Reduction in Area: 0% % Reduction in Volume: 0% Epithelialization: None Wound Description Classification: Full Thickness Without Exposed Support Structures Wound Margin: Distinct, outline attached Exudate Amount: Medium Exudate Type: Serosanguineous Exudate Color: red,  brown Foul Odor After Cleansing: No Slough/Fibrino Yes Wound Bed Granulation Amount: Large (67-100%) Exposed Structure Granulation Quality: Red Fascia Exposed: No Necrotic Amount: Small (1-33%) Fat Layer (Subcutaneous Tissue) Exposed: Yes Necrotic Quality: Adherent Slough Tendon Exposed: No Muscle Exposed: No Joint Exposed: No Bone Exposed: No Periwound Skin Texture Texture Color No Abnormalities Noted: Yes No Abnormalities Noted: No Hemosiderin Staining: Yes Moisture No Abnormalities Noted: No Temperature / Pain Dry / Scaly: No Temperature: No Abnormality Maceration: No Tenderness on Palpation: Yes Treatment Notes Wound #8 (Lower Leg) Wound Laterality: Right, Posterior Cleanser Soap and Water Discharge Instruction: May shower and wash wound with dial antibacterial soap and water prior to dressing change. Wound Cleanser Discharge Instruction: Cleanse the wound with wound cleanser prior to applying a clean dressing using gauze sponges, not tissue or cotton balls. Peri-Wound Care Sween Lotion (Moisturizing lotion) Discharge Instruction: Apply moisturizing lotion as directed Topical Primary Dressing KerraCel Ag Gelling Fiber Dressing, 4x5 in (silver alginate) KYLIAN, LOH ([id number]) [id number] Page 8 of 8 Discharge Instruction: Apply silver alginate to wound bed as instructed Secondary Dressing ABD Pad, 5x9 Discharge Instruction: Apply over primary dressing as directed. Woven Gauze Sponge, Non-Sterile 4x4 in Discharge Instruction: Apply over primary dressing as directed. Secured With Compression Wrap FourPress (4 layer compression wrap) Discharge Instruction: Apply four layer compression as directed. May also use Miliken CoFlex 2 layer compression system as alternative. Compression Stockings Add-Ons Electronic Signature(s) Signed: 02/11/2022 5:00:23 PM By: Adline Peals Entered By: Adline Peals on 02/11/2022 16:54:19

## 2022-02-12 NOTE — Progress Notes (Signed)
PRINSTON, KYNARD ([id number]) [id number] Page 1 of 1 Visit Report for 02/11/2022 SuperBill Details Patient Name: Date of Service: [name], [place] J. 02/11/2022 Medical Record Number: [id number] Patient Account Number: [id number] Date of Birth/Sex: Treating RN: Nov 06, 1960 (61 y.o. 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

## 2022-02-17 ENCOUNTER — Encounter (HOSPITAL_BASED_OUTPATIENT_CLINIC_OR_DEPARTMENT_OTHER): Payer: PPO | Admitting: General Surgery

## 2022-02-17 DIAGNOSIS — I87331 Chronic venous hypertension (idiopathic) with ulcer and inflammation of right lower extremity: Secondary | ICD-10-CM | POA: Diagnosis not present

## 2022-02-17 DIAGNOSIS — L97212 Non-pressure chronic ulcer of right calf with fat layer exposed: Secondary | ICD-10-CM | POA: Diagnosis not present

## 2022-02-17 DIAGNOSIS — I872 Venous insufficiency (chronic) (peripheral): Secondary | ICD-10-CM | POA: Diagnosis not present

## 2022-02-17 DIAGNOSIS — L97312 Non-pressure chronic ulcer of right ankle with fat layer exposed: Secondary | ICD-10-CM | POA: Diagnosis not present

## 2022-02-18 NOTE — Progress Notes (Signed)
IZZAK, FRIES ([id number]) [id number] Page 1 of 12 Visit Report for 02/17/2022 Chief Complaint Document Details Patient Name: Date of Service: [name], [place] J. 02/17/2022 7:30 A M Medical Record Number: [id number] Patient Account Number: [id number] Date of Birth/Sex: Treating RN: 05-Dec-1960 (61 y.o. M) Primary Care Provider: Evonnie Dawes, Jenny Reichmann Other Clinician: Referring Provider: Treating Provider/Extender: Evette Georges in Treatment: 2 Information Obtained from: Patient Chief Complaint 05/20/2020; patient is here for review of wounds on his bilateral lower legs in the setting of severe chronic venous insufficiency with lymphedema 02/03/2022: returns to clinic today with new wounds on RLE Electronic Signature(s) Signed: 02/17/2022 8:12:46 AM By: Fredirick Maudlin MD FACS Entered By: Fredirick Maudlin on 02/17/2022 08:12:46 -------------------------------------------------------------------------------- Debridement Details Patient Name: Date of Service: Marvin Atlas, PA UL J. 02/17/2022 7:30 A M Medical Record Number: [id number] Patient Account Number: [id number] Date of Birth/Sex: Treating RN: 1960-07-11 (61 y.o. M) Primary Care Provider: Evonnie Dawes, Jenny Reichmann Other Clinician: Referring Provider: Treating Provider/Extender: Evette Georges in Treatment: 2 Debridement Performed for Assessment: Wound #7 Right,Lateral Ankle Performed By: Physician Fredirick Maudlin, MD Debridement Type: Debridement Severity of Tissue Pre Debridement: Fat layer exposed Level of Consciousness (Pre-procedure): Awake and Alert Pre-procedure Verification/Time Out Yes - 08:05 Taken: Start Time: 08:06 Pain Control: Lidocaine 5% topical ointment T Area Debrided (L x W): otal 1.1 (cm) x 0.6 (cm) = 0.66 (cm) Tissue and other material debrided: Viable, Non-Viable, Slough, Slough Level: Non-Viable Tissue Debridement Description: Selective/Open  Wound Instrument: Curette Bleeding: Minimum Hemostasis Achieved: Pressure End Time: 08:10 Procedural Pain: 0 Post Procedural Pain: 0 Response to Treatment: Procedure was tolerated well Level of Consciousness (Post- Awake and Alert procedure): Post Debridement Measurements of Total Wound Length: (cm) 1.1 Width: (cm) 0.6 Depth: (cm) 0.1 Volume: (cm) 0.052 [name], [name] ([id number]) [id number] Page 2 of 12 Character of Wound/Ulcer Post Debridement: Improved Severity of Tissue Post Debridement: Fat layer exposed Post Procedure Diagnosis Same as Pre-procedure Electronic Signature(s) Signed: 02/17/2022 8:26:08 AM By: Fredirick Maudlin MD FACS Entered By: Fredirick Maudlin on 02/17/2022 08:26:08 -------------------------------------------------------------------------------- Debridement Details Patient Name: Date of Service: Marvin Atlas, PA UL J. 02/17/2022 7:30 A M Medical Record Number: [id number] Patient Account Number: [id number] Date of Birth/Sex: Treating RN: 1961-03-29 (61 y.o. M) Primary Care Provider: Evonnie Dawes, Jenny Reichmann Other Clinician: Referring Provider: Treating Provider/Extender: Evette Georges in Treatment: 2 Debridement Performed for Assessment: Wound #8 Right,Posterior Lower Leg Performed By: Physician Fredirick Maudlin, MD Debridement Type: Debridement Severity of Tissue Pre Debridement: Fat layer exposed Level of Consciousness (Pre-procedure): Awake and Alert Pre-procedure Verification/Time Out Yes - 08:05 Taken: Start Time: 08:06 Pain Control: Lidocaine 5% topical ointment T Area Debrided (L x W): otal 2.2 (cm) x 0.7 (cm) = 1.54 (cm) Tissue and other material debrided: Viable, Non-Viable, Slough, Slough Level: Non-Viable Tissue Debridement Description: Selective/Open Wound Instrument: Curette Bleeding: Minimum Hemostasis Achieved: Pressure End Time: 08:10 Procedural Pain: 0 Post Procedural Pain: 0 Response to  Treatment: Procedure was tolerated well Level of Consciousness (Post- Awake and Alert procedure): Post Debridement Measurements of Total Wound Length: (cm) 2.2 Width: (cm) 0.7 Depth: (cm) 0.1 Volume: (cm) 0.121 Character of Wound/Ulcer Post Debridement: Improved Severity of Tissue Post Debridement: Fat layer exposed Post Procedure Diagnosis Same as Pre-procedure Electronic Signature(s) Signed: 02/17/2022 8:26:20 AM By: Fredirick Maudlin MD FACS Entered By: Fredirick Maudlin on 02/17/2022 08:26:20 HPI Details -------------------------------------------------------------------------------- [name] ([id number]) [id number] Page 3 of 12 Patient Name: Date of Service: Marvin  Abigail [name], [name] 02/17/2022 7:30 A M Medical Record Number: [id number] Patient Account Number: [id number] Date of Birth/Sex: Treating RN: 01/16/1961 (61 y.o. M) Primary Care Provider: Evonnie Dawes, Jenny Reichmann Other Clinician: Referring Provider: Treating Provider/Extender: Evette Georges in Treatment: 2 History of Present Illness HPI Description: ADMISSION 05/20/2020 This is a 61 year old man who is not a diabetic however he has a long history of chronic venous insufficiency with wounds on his lower extremities. He went to the wound care center in [place] for a long period of time with recurrent wounds in these areas treated with compression wraps. He comes in today with wounds on his bilateral lower legs that he says have been there about a year on the right and about 6 months on the left. He went to see Dr. Oneida Alar on 01/17/2020 who noted that he had no history of DVT but does have a history of venous insufficiency wounds. They noted that he was considering laser ablation in October 2020 but he canceled the procedure. He had an episode of bleeding from varicose veins in 2019. He had repeated venous reflux study on 10/6 which showed no evidence of a DVT or SVT Noted that he had  venous reflux in the right greater saphenous vein in the thigh and the calf on the right. Also . noted to have venous reflux in the right femoral vein right popliteal vein and right small saphenous vein. At the time in October they wanted the wounds to heal a bit he also also had a right groin wound which is apparently closed. Not sure about the follow-up with vein and vascular. He comes in today with some light compression wraps and collagen on the wound placed by Dr. Lilia Pro who is a Education officer, environmental in [place]. He has 2 areas on the left leg 1 anteriorly and a large area posteriorly and a smaller area on the right anterior lower leg. He has marked lower extremity edema which is nonpitting. Hemosiderin deposition on both lower legs extensively. There is dilated venules in his feet. Past medical history includes history of opioid abuse in remission, left nephrolithiasis, history of an ablation although I did not see this in Dr. Oneida Alar notes, congestive heart failure and anemia ABIs in our clinic were noncompressible bilaterally 05/27/2020; we admitted this patient to clinic last week. He has bilateral superficial venous insufficiency with bilateral wounds left greater than right. We put him in 4 layer compression with silver alginate. The he was previously cared for in the [place] wound care clinic when it was open. His wounds are better with compression this week and there is certainly less swelling. 2/21; bilateral superficial venous insufficiency wounds. A cluster on the left lateral. He has very well-defined areas on the right anterior right and right lateral. We are using silver alginate and 4-layer compression 2/28; bilateral superficial venous insufficiency wounds in the setting of significant stage III lymphedema. He has a cluster on the left which is smaller. His area on the right anterior and right lateral also look better. 3/14 superficial bilateral venous insufficiency wounds. Much better  on both sides in fact the area on the right posterior is healed. He still has 2 small open areas on the right anterior and the left lateral 3/21; superficial bilateral venous insufficiency wounds. Everything is closed on the right side. Small area still remains on the left. We will transition him today to his right extremiteaze stocking. Still dressing the remaining wound on the left with compression  3/28; the patient's right leg remains closed in his wraparound stocking. Disappointingly today the area on the left lateral leg looks about the same. I thought this might be closed with been using silver alginate 4/4; right leg remains in his wraparound stocking.. Left lateral leg measures smaller even under illumination the wound looks good. Changed him to [facility] with additional ABD compression last week 4/11; left lateral posterior very tiny superficial wound. This should be closed by next week. Using Hydrofera Blue. 4/18; left lateral wound is totally epithelialized. He has his own external compression stocking and compression pumps. READMISSION 02/03/2022 This is a 61 year old man well-known to the wound care center. He has chronic venous insufficiency and lymphedema. He has been evaluated in the past and has documented venous reflux, but thus far, has not undergone ablation. He returns to clinic today with multiple wounds on his right lower extremity. He reports that the medial lower leg wound started when something sharp stuck into his leg about a year ago. He says he was trying to treat this at home and took a course of cephalexin that he had leftover from a previous event. It did not improve and in the past 3 to 4 weeks, it has actually gotten worse, to the point that he is unable to use his lymphedema pumps secondary to pain. He has another ulcer on his left lateral ankle and one between his first and second toes, both present for about 3 to 4 weeks, per his report. On his right medial  lower leg, there are 2 ulcers with slough accumulated. The surface underneath the slough is quite fibrous, consistent with his history of multiple lower leg ulcerations. He has a triangular wound on his right lateral ankle, similar in appearance to the others. Between his first and second toes, he has what appears to be athlete's foot, with a fungal odor and skin breakdown with eschar and slough present. 02/09/2022: On the right medial lower leg wound, there is significant slough accumulation. The right lateral ankle wound is gray and the patient reports an increase in pain in this site. The area between his first and second toes has dried out considerably. The more proximal ulcer on his right posterior leg has some slough buildup over a fibrotic surface. Edema control is markedly improved. 02/17/2022: The right medial lower leg and right lateral ankle wound still have a lot of slough accumulation, but they are less painful. The culture that I took last week grew out multiple gram-positive species. The area between his first and second toes still has a consistent appearance with athlete's foot. The more proximal lateral lower leg wound has healed. He still has not been scheduled for his repeat venous reflux studies. Electronic Signature(s) Signed: 02/17/2022 8:13:58 AM By: Fredirick Maudlin MD FACS Entered By: Fredirick Maudlin on 02/17/2022 08:13:58 -------------------------------------------------------------------------------- Physical Exam Details Patient Name: Date of Service: Marvin Atlas, PA UL J. 02/17/2022 7:30 A M Medical Record Number: [id number] Patient Account Number: [id number] Date of Birth/Sex: Treating RN: [birth year] (61 y.o. [name], [name] ([id number]) [id number] Page 4 of 12 Primary Care Provider: Evonnie Dawes, Jenny Reichmann Other Clinician: Referring Provider: Treating Provider/Extender: Evette Georges in Treatment: 2 Constitutional . . . .  No acute distress.Marland Kitchen Respiratory Normal work of breathing on room air.. Notes 02/17/2022: The right medial lower leg and right lateral ankle wound still have a lot of slough accumulation, but they are less painful. The area between his first and second toes still  has a consistent appearance with athlete's foot. The more proximal lateral lower leg wound has healed. Electronic Signature(s) Signed: 02/17/2022 8:24:08 AM By: Fredirick Maudlin MD FACS Entered By: Fredirick Maudlin on 02/17/2022 08:24:08 -------------------------------------------------------------------------------- Physician Orders Details Patient Name: Date of Service: Marvin Atlas, PA UL J. 02/17/2022 7:30 A M Medical Record Number: [id number] Patient Account Number: [id number] Date of Birth/Sex: Treating RN: 11/18/1960 (61 y.o. [name], [name] Primary Care Provider: Evonnie Dawes, Jenny Reichmann Other Clinician: Referring Provider: Treating Provider/Extender: Evette Georges in Treatment: 2 Verbal / Phone Orders: No Diagnosis Coding ICD-10 Coding Code Description [phone] Chronic venous hypertension (idiopathic) with ulcer and inflammation of right lower extremity L97.812 Non-pressure chronic ulcer of other part of right lower leg with fat layer exposed 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 Follow-up Appointments ppointment in 1 week. - w/ Dr. Celine Ahr and [name] # 4 (he prefers early in the morning) Return A Nurse Visit: - Thursday afternoon 02/19/2022 Other: - patient to purchase over the counter zinc oxide (desitin) apply between toes. Vein and Vascular Speak with Damita Dunnings about venous studies phone [phone]. Appt scheduled 02/19/2022 0900. Anesthetic (In clinic) Topical Lidocaine 4% applied to wound bed - all wounds prior to debridement Bathing/ Shower/ Hygiene May shower with protection but  do not get wound dressing(s) wet. Edema Control - Lymphedema / SCD / Other Lymphedema Pumps. Use Lymphedema pumps on leg(s) 2-3 times a day for 45-60 minutes. If wearing any wraps or hose, do not remove them. Continue exercising as instructed. Elevate legs to the level of the heart or above for 30 minutes daily and/or when sitting, a frequency of: Avoid standing for long periods of time. Exercise regularly Compression stocking or Garment 20-30 mm/Hg pressure to: - toleftleg daily Wound Treatment Wound #6 - T - Web between 1st and 2nd oe Wound Laterality: Right Cleanser: Soap and Water 1 x Per Week/7 Days Discharge Instructions: May shower and wash wound with dial antibacterial soap and water prior to dressing change. Cleanser: Wound Cleanser 1 x Per Week/7 Days Discharge Instructions: Cleanse the wound with wound cleanser prior to applying a clean dressing using gauze sponges, not tissue or cotton balls. Peri-Wound Care: Sween Lotion (Moisturizing lotion) 1 x Per Week/7 Days Discharge Instructions: Apply moisturizing lotion as directed ITAI, BARBIAN ([id number]) [id number] Page 5 of 12 Topical: Mupirocin Ointment 1 x Per Week/7 Days Discharge Instructions: Apply Mupirocin (Bactroban) as instructed Prim Dressing: KerraCel Ag Gelling Fiber Dressing, 4x5 in (silver alginate) 1 x Per Week/7 Days ary Discharge Instructions: Apply silver alginate to wound bed as instructed Secondary Dressing: ABD Pad, 5x9 1 x Per Week/7 Days Discharge Instructions: Apply over primary dressing as directed. Secondary Dressing: Woven Gauze Sponge, Non-Sterile 4x4 in 1 x Per Week/7 Days Discharge Instructions: Apply over primary dressing as directed. Compression Wrap: FourPress (4 layer compression wrap) 1 x Per Week/7 Days Discharge Instructions: Apply four layer compression as directed. May also use Miliken CoFlex 2 layer compression system as alternative. Wound #7 - Ankle Wound  Laterality: Right, Lateral Cleanser: Soap and Water 1 x Per Week/7 Days Discharge Instructions: May shower and wash wound with dial antibacterial soap and water prior to dressing change. Cleanser: Wound Cleanser 1 x Per Week/7 Days Discharge Instructions: Cleanse the wound with wound cleanser prior to applying a clean dressing using gauze sponges, not tissue or cotton balls. Peri-Wound Care: Bernie Covey  Lotion (Moisturizing lotion) 1 x Per Week/7 Days Discharge Instructions: Apply moisturizing lotion as directed Topical: Mupirocin Ointment 1 x Per Week/7 Days Discharge Instructions: Apply Mupirocin (Bactroban) as instructed Prim Dressing: KerraCel Ag Gelling Fiber Dressing, 4x5 in (silver alginate) 1 x Per Week/7 Days ary Discharge Instructions: Apply silver alginate to wound bed as instructed Secondary Dressing: ABD Pad, 5x9 1 x Per Week/7 Days Discharge Instructions: Apply over primary dressing as directed. Secondary Dressing: Woven Gauze Sponge, Non-Sterile 4x4 in 1 x Per Week/7 Days Discharge Instructions: Apply over primary dressing as directed. Compression Wrap: FourPress (4 layer compression wrap) 1 x Per Week/7 Days Discharge Instructions: Apply four layer compression as directed. May also use Miliken CoFlex 2 layer compression system as alternative. Wound #8 - Lower Leg Wound Laterality: Right, Posterior Cleanser: Soap and Water 1 x Per Week/7 Days Discharge Instructions: May shower and wash wound with dial antibacterial soap and water prior to dressing change. Cleanser: Wound Cleanser 1 x Per Week/7 Days Discharge Instructions: Cleanse the wound with wound cleanser prior to applying a clean dressing using gauze sponges, not tissue or cotton balls. Peri-Wound Care: Sween Lotion (Moisturizing lotion) 1 x Per Week/7 Days Discharge Instructions: Apply moisturizing lotion as directed Topical: Mupirocin Ointment 1 x Per Week/7 Days Discharge Instructions: Apply Mupirocin (Bactroban) as  instructed Prim Dressing: KerraCel Ag Gelling Fiber Dressing, 4x5 in (silver alginate) 1 x Per Week/7 Days ary Discharge Instructions: Apply silver alginate to wound bed as instructed Secondary Dressing: ABD Pad, 5x9 1 x Per Week/7 Days Discharge Instructions: Apply over primary dressing as directed. Secondary Dressing: Woven Gauze Sponge, Non-Sterile 4x4 in 1 x Per Week/7 Days Discharge Instructions: Apply over primary dressing as directed. Compression Wrap: FourPress (4 layer compression wrap) 1 x Per Week/7 Days Discharge Instructions: Apply four layer compression as directed. May also use Miliken CoFlex 2 layer compression system as alternative. Electronic Signature(s) Signed: 02/17/2022 8:37:41 AM By: Fredirick Maudlin MD FACS Signed: 02/17/2022 5:13:10 PM By: Deon Pilling RN, BSN Entered By: Deon Pilling on 02/17/2022 08:32:08 [name] ([id number]) [id number] Page 6 of 12 -------------------------------------------------------------------------------- Problem List Details Patient Name: Date of Service: [name], [place] J. 02/17/2022 7:30 A M Medical Record Number: [id number] Patient Account Number: [id number] Date of Birth/Sex: Treating RN: 05/07/1960 (62 y.o. [name], [name] Primary Care Provider: Evonnie Dawes, Jenny Reichmann Other Clinician: Referring Provider: Treating Provider/Extender: Evette Georges in Treatment: 2 Active Problems ICD-10 Encounter Code Description Active Date MDM Diagnosis I87.331 Chronic venous hypertension (idiopathic) with ulcer and inflammation of right 02/03/2022 No Yes lower extremity L97.812 Non-pressure chronic ulcer of other part of right lower leg with fat layer 02/03/2022 No Yes exposed L97.312 Non-pressure chronic ulcer of right ankle with fat layer exposed 02/03/2022 No Yes L97.512 Non-pressure chronic ulcer of other part of right foot with fat layer exposed 02/03/2022 No Yes Inactive  Problems Resolved Problems Electronic Signature(s) Signed: 02/17/2022 8:12:33 AM By: Fredirick Maudlin MD FACS Entered By: Fredirick Maudlin on 02/17/2022 08:12:33 -------------------------------------------------------------------------------- Progress Note Details Patient Name: Date of Service: Marvin Atlas, PA UL J. 02/17/2022 7:30 A M Medical Record Number: [id number] Patient Account Number: [id number] Date of Birth/Sex: Treating RN: [birth year] (61 y.o. M) Primary Care Provider: Evonnie Dawes, Jenny Reichmann Other Clinician: Referring Provider: Treating Provider/Extender: Evette Georges in Treatment: 2 Subjective Chief Complaint Information obtained from Patient 05/20/2020; patient is here for review of wounds on his bilateral lower legs in the setting of severe chronic venous insufficiency with  lymphedema 02/03/2022: returns to clinic today with new wounds on RLE History of Present Illness (HPI) Hanahan, Claudy J ([id number]) [id number] Page 7 of 12 05/20/2020 This is a 61 year old man who is not a diabetic however he has a long history of chronic venous insufficiency with wounds on his lower extremities. He went to the wound care center in [place] for a long period of time with recurrent wounds in these areas treated with compression wraps. He comes in today with wounds on his bilateral lower legs that he says have been there about a year on the right and about 6 months on the left. He went to see Dr. Oneida Alar on 01/17/2020 who noted that he had no history of DVT but does have a history of venous insufficiency wounds. They noted that he was considering laser ablation in October 2020 but he canceled the procedure. He had an episode of bleeding from varicose veins in 2019. He had repeated venous reflux study on 10/6 which showed no evidence of a DVT or SVT Noted that he had venous reflux in the right greater saphenous vein in the thigh and the calf on  the right. Also . noted to have venous reflux in the right femoral vein right popliteal vein and right small saphenous vein. At the time in October they wanted the wounds to heal a bit he also also had a right groin wound which is apparently closed. Not sure about the follow-up with vein and vascular. He comes in today with some light compression wraps and collagen on the wound placed by Dr. Lilia Pro who is a Education officer, environmental in [place]. He has 2 areas on the left leg 1 anteriorly and a large area posteriorly and a smaller area on the right anterior lower leg. He has marked lower extremity edema which is nonpitting. Hemosiderin deposition on both lower legs extensively. There is dilated venules in his feet. Past medical history includes history of opioid abuse in remission, left nephrolithiasis, history of an ablation although I did not see this in Dr. Oneida Alar notes, congestive heart failure and anemia ABIs in our clinic were noncompressible bilaterally 05/27/2020; we admitted this patient to clinic last week. He has bilateral superficial venous insufficiency with bilateral wounds left greater than right. We put him in 4 layer compression with silver alginate. The he was previously cared for in the [place] wound care clinic when it was open. His wounds are better with compression this week and there is certainly less swelling. 2/21; bilateral superficial venous insufficiency wounds. A cluster on the left lateral. He has very well-defined areas on the right anterior right and right lateral. We are using silver alginate and 4-layer compression 2/28; bilateral superficial venous insufficiency wounds in the setting of significant stage III lymphedema. He has a cluster on the left which is smaller. His area on the right anterior and right lateral also look better. 3/14 superficial bilateral venous insufficiency wounds. Much better on both sides in fact the area on the right posterior is healed. He still has  2 small open areas on the right anterior and the left lateral 3/21; superficial bilateral venous insufficiency wounds. Everything is closed on the right side. Small area still remains on the left. We will transition him today to his right extremiteaze stocking. Still dressing the remaining wound on the left with compression 3/28; the patient's right leg remains closed in his wraparound stocking. Disappointingly today the area on the left lateral leg looks about the same. I thought this  might be closed with been using silver alginate 4/4; right leg remains in his wraparound stocking.. Left lateral leg measures smaller even under illumination the wound looks good. Changed him to [facility] with additional ABD compression last week 4/11; left lateral posterior very tiny superficial wound. This should be closed by next week. Using Hydrofera Blue. 4/18; left lateral wound is totally epithelialized. He has his own external compression stocking and compression pumps. READMISSION 02/03/2022 This is a 61 year old man well-known to the wound care center. He has chronic venous insufficiency and lymphedema. He has been evaluated in the past and has documented venous reflux, but thus far, has not undergone ablation. He returns to clinic today with multiple wounds on his right lower extremity. He reports that the medial lower leg wound started when something sharp stuck into his leg about a year ago. He says he was trying to treat this at home and took a course of cephalexin that he had leftover from a previous event. It did not improve and in the past 3 to 4 weeks, it has actually gotten worse, to the point that he is unable to use his lymphedema pumps secondary to pain. He has another ulcer on his left lateral ankle and one between his first and second toes, both present for about 3 to 4 weeks, per his report. On his right medial lower leg, there are 2 ulcers with slough accumulated. The surface underneath  the slough is quite fibrous, consistent with his history of multiple lower leg ulcerations. He has a triangular wound on his right lateral ankle, similar in appearance to the others. Between his first and second toes, he has what appears to be athlete's foot, with a fungal odor and skin breakdown with eschar and slough present. 02/09/2022: On the right medial lower leg wound, there is significant slough accumulation. The right lateral ankle wound is gray and the patient reports an increase in pain in this site. The area between his first and second toes has dried out considerably. The more proximal ulcer on his right posterior leg has some slough buildup over a fibrotic surface. Edema control is markedly improved. 02/17/2022: The right medial lower leg and right lateral ankle wound still have a lot of slough accumulation, but they are less painful. The culture that I took last week grew out multiple gram-positive species. The area between his first and second toes still has a consistent appearance with athlete's foot. The more proximal lateral lower leg wound has healed. He still has not been scheduled for his repeat venous reflux studies. Patient History Information obtained from Patient. Family History Cancer, Diabetes - Father, Hypertension - Father, No family history of Heart Disease, Hereditary Spherocytosis, Kidney Disease, Lung Disease, Seizures, Stroke, Thyroid Problems, Tuberculosis. Social History Current every day smoker, Marital Status - Divorced, Alcohol Use - Never, Drug Use - Prior History, Caffeine Use - Rarely. Medical History Eyes Denies history of Cataracts, Glaucoma, Optic Neuritis Ear/Nose/Mouth/Throat Denies history of Chronic sinus problems/congestion, Middle ear problems Hematologic/Lymphatic Patient has history of Anemia Denies history of Hemophilia, Human Immunodeficiency Virus, Lymphedema, Sickle Cell Disease Respiratory Patient has history of Sleep Apnea Denies  history of Aspiration, Asthma, Chronic Obstructive Pulmonary Disease (COPD), Pneumothorax, Tuberculosis Cardiovascular Patient has history of Congestive Heart Failure, Coronary Artery Disease, Peripheral Venous Disease Denies history of Angina, Arrhythmia, Deep Vein Thrombosis, Hypertension, Hypotension, Myocardial Infarction, Peripheral Arterial Disease, Phlebitis, Vasculitis Gastrointestinal Denies history of Cirrhosis , Colitis, Crohnoos, Hepatitis A, Hepatitis B, Hepatitis C Endocrine Denies history  of Type I Diabetes, Type II Diabetes Genitourinary Denies history of End Stage Renal Disease Immunological Denies history of Lupus Erythematosus, Raynaudoos, Scleroderma Integumentary (Skin) [name], [name] ([id number]) [id number] Page 8 of 12 Denies history of History of Burn Musculoskeletal Patient has history of Osteoarthritis Denies history of Gout, Rheumatoid Arthritis, Osteomyelitis Neurologic Denies history of Dementia, Neuropathy, Quadriplegia, Paraplegia, Seizure Disorder Oncologic Denies history of Received Chemotherapy, Received Radiation Psychiatric Denies history of Anorexia/bulimia, Confinement Anxiety Hospitalization/Surgery History - right hip nailing 10/17/2019. - 05/17/2018 nephrolithotomy left. - 01/29/2017 right lymph noes biopsy. - cystoscopy 2017, 2018. - 2017 gastric bypass surgery. Medical A Surgical History Notes nd Gastrointestinal gastric bypass Objective Constitutional No acute distress.. Vitals Time Taken: 7:45 AM, Temperature: 98.2 F, Pulse: 62 bpm, Respiratory Rate: 16 breaths/min, Blood Pressure: 119/72 mmHg. Respiratory Normal work of breathing on room air.. General Notes: 02/17/2022: The right medial lower leg and right lateral ankle wound still have a lot of slough accumulation, but they are less painful. The area between his first and second toes still has a consistent appearance with athlete's foot. The more proximal  lateral lower leg wound has healed. Integumentary (Hair, Skin) Wound #5 status is Open. Original cause of wound was Trauma. The date acquired was: 02/03/2022. The wound has been in treatment 2 weeks. The wound is located on the Right,Lateral Lower Leg. The wound measures 0cm length x 0cm width x 0cm depth; 0cm^2 area and 0cm^3 volume. There is no tunneling or undermining noted. There is a none present amount of drainage noted. The wound margin is distinct with the outline attached to the wound base. There is no granulation within the wound bed. There is no necrotic tissue within the wound bed. The periwound skin appearance did not exhibit: Callus, Crepitus, Excoriation, Induration, Rash, Scarring, Dry/Scaly, Maceration, Atrophie Blanche, Cyanosis, Ecchymosis, Hemosiderin Staining, Mottled, Pallor, Rubor, Erythema. Periwound temperature was noted as No Abnormality. The periwound has tenderness on palpation. Wound #6 status is Open. Original cause of wound was Gradually Appeared. The date acquired was: 01/11/2022. The wound has been in treatment 2 weeks. The wound is located on the Right T - Web between 1st and 2nd. The wound measures 0.9cm length x 0.9cm width x 0.1cm depth; 0.636cm^2 area and 0.064cm^3 oe volume. There is Fat Layer (Subcutaneous Tissue) exposed. There is no tunneling or undermining noted. There is a medium amount of serosanguineous drainage noted. The wound margin is distinct with the outline attached to the wound base. There is medium (34-66%) pink granulation within the wound bed. There is a medium (34-66%) amount of necrotic tissue within the wound bed including Adherent Slough. The periwound skin appearance exhibited: Maceration. The periwound skin appearance did not exhibit: Callus, Crepitus, Excoriation, Induration, Rash, Scarring, Dry/Scaly, Atrophie Blanche, Cyanosis, Ecchymosis, Hemosiderin Staining, Mottled, Pallor, Rubor, Erythema. Periwound temperature was noted as No  Abnormality. The periwound has tenderness on palpation. Wound #7 status is Open. Original cause of wound was Gradually Appeared. The date acquired was: 01/11/2022. The wound has been in treatment 2 weeks. The wound is located on the Right,Lateral Ankle. The wound measures 1.1cm length x 0.6cm width x 0.1cm depth; 0.518cm^2 area and 0.052cm^3 volume. There is Fat Layer (Subcutaneous Tissue) exposed. There is no tunneling or undermining noted. There is a medium amount of serosanguineous drainage noted. The wound margin is distinct with the outline attached to the wound base. There is medium (34-66%) red, pink granulation within the wound bed. There is a medium (34-66%) amount of necrotic tissue within  the wound bed including Adherent Slough. The periwound skin appearance did not exhibit: Callus, Crepitus, Excoriation, Induration, Rash, Scarring, Dry/Scaly, Maceration, Atrophie Blanche, Cyanosis, Ecchymosis, Hemosiderin Staining, Mottled, Pallor, Rubor, Erythema. Periwound temperature was noted as No Abnormality. The periwound has tenderness on palpation. Wound #8 status is Open. Original cause of wound was Gradually Appeared. The date acquired was: 01/11/2021. The wound has been in treatment 1 weeks. The wound is located on the Right,Posterior Lower Leg. The wound measures 2.2cm length x 0.7cm width x 0.1cm depth; 1.21cm^2 area and 0.121cm^3 volume. There is Fat Layer (Subcutaneous Tissue) exposed. There is no tunneling or undermining noted. There is a medium amount of serosanguineous drainage noted. The wound margin is distinct with the outline attached to the wound base. There is large (67-100%) red granulation within the wound bed. There is a small (1-33%) amount of necrotic tissue within the wound bed including Adherent Slough. The periwound skin appearance had no abnormalities noted for texture. The periwound skin appearance exhibited: Hemosiderin Staining. The periwound skin appearance did not  exhibit: Dry/Scaly, Maceration. Periwound temperature was noted as No Abnormality. The periwound has tenderness on palpation. 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 There was a Selective/Open Wound Non-Viable Tissue Debridement with a total area of 0.66 sq cm performed by Fredirick Maudlin, MD. With the following instrument(s): Curette to remove Viable and Non-Viable tissue/material. Material removed includes [facility] after achieving pain control using Lidocaine 5% topical ointment. A time out was conducted at 08:05, prior to the start of the procedure. A Minimum amount of bleeding was controlled with Pressure. The procedure was tolerated well with a pain level of 0 throughout and a pain level of 0 following the procedure. Post Debridement Measurements: 1.1cm length x 0.6cm width x 0.1cm depth; 0.052cm^3 volume. Character of Wound/Ulcer Post Debridement is improved. Severity of Tissue Post Debridement is: Fat layer exposed. Post procedure Diagnosis Wound #7: Same as Pre-Procedure Pre-procedure diagnosis of Wound #7 is a Venous Leg Ulcer located on the Right,Lateral Ankle . There was a Four Layer Compression Therapy Procedure by Deon Pilling, RN. Post procedure Diagnosis Wound #7: Same as Pre-Procedure Wound #8 Pre-procedure diagnosis of Wound #8 is a Venous Leg Ulcer located on the Right,Posterior Lower Leg .Severity of Tissue Pre  Debridement is: Fat layer exposed. There was a Selective/Open Wound Non-Viable Tissue Debridement with a total area of 1.54 sq cm performed by Fredirick Maudlin, MD. With the following instrument(s): Curette to remove Viable and Non-Viable tissue/material. Material removed includes [facility] after achieving pain control using Lidocaine 5% topical ointment. A time out was conducted at 08:05, prior to the start of the procedure. A Minimum amount of bleeding was controlled with Pressure. The procedure was tolerated well with a pain level of 0 throughout and a pain level of 0 following the procedure. Post Debridement Measurements: 2.2cm length x 0.7cm width x 0.1cm depth; 0.121cm^3 volume. Character of Wound/Ulcer Post Debridement is improved. Severity of Tissue Post Debridement is: Fat layer exposed. Post procedure Diagnosis Wound #8: Same as  Pre-Procedure Pre-procedure diagnosis of Wound #8 is a Venous Leg Ulcer located on the Right,Posterior Lower Leg . There was a Four Layer Compression Therapy Procedure by Deon Pilling, RN. Post procedure Diagnosis Wound #8: Same as Pre-Procedure Plan Follow-up Appointments: Return Appointment in 1 week. - w/ Dr. Celine Ahr and [name] # 4 (he prefers early in the morning) Other: - patient to purchase over the counter zinc oxide (desitin) apply between toes. patient to call Vein and Vascular Speak with Damita Dunnings about venous studies phone [phone]. Anesthetic: (In clinic) Topical Lidocaine 4% applied to wound bed - all wounds prior to debridement Bathing/ Shower/ Hygiene: May shower with protection but do not get wound dressing(s) wet. Edema Control - Lymphedema / SCD / Other: Lymphedema Pumps. Use Lymphedema pumps on leg(s) 2-3 times a day for 45-60 minutes. If wearing any wraps or hose, do not remove them. Continue exercising as instructed. Elevate legs to the level of the heart or above for 30 minutes daily and/or when sitting, a frequency of: Avoid standing  for long periods of time. Exercise regularly Compression stocking or Garment 20-30 mm/Hg pressure to: - toleftleg daily WOUND #6: - T - Web between 1st and 2nd Wound Laterality: Right oe Cleanser: Soap and Water 1 x Per Week/7 Days Discharge Instructions: May shower and wash wound with dial antibacterial soap and water prior to dressing change. Cleanser: Wound Cleanser 1 x Per Week/7 Days Discharge Instructions: Cleanse the wound with wound cleanser prior to applying a clean dressing using gauze sponges, not tissue or cotton balls. Peri-Wound Care: Sween Lotion (Moisturizing lotion) 1 x Per Week/7 Days Discharge Instructions: Apply moisturizing lotion as directed Topical: Mupirocin Ointment 1 x Per Week/7 Days Discharge Instructions: Apply Mupirocin (Bactroban) as instructed Prim Dressing: KerraCel Ag Gelling Fiber Dressing, 4x5 in (silver alginate) 1 x Per Week/7 Days ary Discharge Instructions: Apply silver alginate to wound bed as instructed Secondary Dressing: ABD Pad, 5x9 1 x Per Week/7 Days Discharge Instructions: Apply over primary dressing as directed. Secondary Dressing: Woven Gauze Sponge, Non-Sterile 4x4 in 1 x Per Week/7 Days Discharge Instructions: Apply over primary dressing as directed. Com pression Wrap: FourPress (4 layer compression wrap) 1 x Per Week/7 Days Discharge Instructions: Apply four layer compression as directed. May also use Miliken CoFlex 2 layer compression system as alternative. WOUND #7: - Ankle Wound Laterality: Right, Lateral Cleanser: Soap and Water 1 x Per Week/7 Days Discharge Instructions: May shower and wash wound with dial antibacterial soap and water prior to dressing change. Cleanser: Wound Cleanser 1 x Per Week/7 Days Discharge Instructions: Cleanse the wound with wound cleanser prior to applying a clean dressing using gauze sponges, not tissue or cotton balls. Peri-Wound Care: Sween Lotion (Moisturizing lotion) 1 x Per Week/7 Days Discharge  Instructions: Apply moisturizing lotion as directed Topical: Mupirocin Ointment 1 x Per Week/7 Days Discharge Instructions: Apply Mupirocin (Bactroban) as instructed Prim Dressing: KerraCel Ag Gelling Fiber Dressing, 4x5 in (silver alginate) 1 x Per Week/7 Days ary Discharge Instructions: Apply silver alginate to wound bed as instructed Secondary Dressing: ABD Pad, 5x9 1 x Per Week/7 Days Discharge Instructions: Apply over primary dressing as directed. Secondary Dressing: Woven Gauze Sponge, Non-Sterile 4x4 in 1 x Per Week/7 Days Discharge Instructions: Apply over primary dressing as directed. Com pression Wrap: FourPress (4 layer compression wrap) 1 x Per Week/7 Days [name], [name] ([id number]) [id number] Page 10 of 12 Discharge Instructions: Apply four layer compression as directed. May also use Miliken CoFlex 2 layer compression system  as alternative. WOUND #8: - Lower Leg Wound Laterality: Right, Posterior Cleanser: Soap and Water 1 x Per Week/7 Days Discharge Instructions: May shower and wash wound with dial antibacterial soap and water prior to dressing change. Cleanser: Wound Cleanser 1 x Per Week/7 Days Discharge Instructions: Cleanse the wound with wound cleanser prior to applying a clean dressing using gauze sponges, not tissue or cotton balls. Peri-Wound Care: Sween Lotion (Moisturizing lotion) 1 x Per Week/7 Days Discharge Instructions: Apply moisturizing lotion as directed Topical: Mupirocin Ointment 1 x Per Week/7 Days Discharge Instructions: Apply Mupirocin (Bactroban) as instructed Prim Dressing: KerraCel Ag Gelling Fiber Dressing, 4x5 in (silver alginate) 1 x Per Week/7 Days ary Discharge Instructions: Apply silver alginate to wound bed as instructed Secondary Dressing: ABD Pad, 5x9 1 x Per Week/7 Days Discharge Instructions: Apply over primary dressing as directed. Secondary Dressing: Woven Gauze Sponge, Non-Sterile 4x4 in 1 x Per Week/7  Days Discharge Instructions: Apply over primary dressing as directed. Com pression Wrap: FourPress (4 layer compression wrap) 1 x Per Week/7 Days Discharge Instructions: Apply four layer compression as directed. May also use Miliken CoFlex 2 layer compression system as alternative. 02/17/2022: The right medial lower leg and right lateral ankle wound still have a lot of slough accumulation, but they are less painful. The area between his first and second toes still has a consistent appearance with athlete's foot. The more proximal lateral lower leg wound has healed. I used a curette to debride slough off of the right medial lower leg and right ankle wounds. He would not permit any debridement between his toes. We will continue with silver alginate on the legs but will change the topical antibiotic to mupirocin consistent with what was found on his PCR culture. We will mix ketoconazole with zinc oxide to try and dry out the athlete's foot between his toes. Continue 4-layer compression. We will reach out to vascular surgery to find out why he is not yet had his repeat venous reflux studies. Follow-up in 1 week. Electronic Signature(s) Signed: 02/17/2022 8:26:36 AM By: Fredirick Maudlin MD FACS Previous Signature: 02/17/2022 8:25:23 AM Version By: Fredirick Maudlin MD FACS Entered By: Fredirick Maudlin on 02/17/2022 08:26:36 -------------------------------------------------------------------------------- HxROS Details Patient Name: Date of Service: Marvin Atlas, PA UL J. 02/17/2022 7:30 A M Medical Record Number: [id number] Patient Account Number: [id number] Date of Birth/Sex: Treating RN: [birth year] (61 y.o. M) Primary Care Provider: Evonnie Dawes, Jenny Reichmann Other Clinician: Referring Provider: Treating Provider/Extender: Evette Georges in Treatment: 2 Information Obtained From Patient Eyes Medical History: Negative for: Cataracts; Glaucoma; Optic Neuritis Ear/Nose/Mouth/Throat Medical  History: Negative for: Chronic sinus problems/congestion; Middle ear problems Hematologic/Lymphatic Medical History: Positive for: Anemia Negative for: Hemophilia; Human Immunodeficiency Virus; Lymphedema; Sickle Cell Disease Respiratory Medical History: Positive for: Sleep Apnea Negative for: Aspiration; Asthma; Chronic Obstructive Pulmonary Disease (COPD); Pneumothorax; Tuberculosis Cardiovascular Medical History: [name], [name] ([id number]) [id number] Page 11 of 12 Positive for: Congestive Heart Failure; Coronary Artery Disease; Peripheral Venous Disease Negative for: Angina; Arrhythmia; Deep Vein Thrombosis; Hypertension; Hypotension; Myocardial Infarction; Peripheral Arterial Disease; Phlebitis; Vasculitis Gastrointestinal Medical History: Negative for: Cirrhosis ; Colitis; Crohns; Hepatitis A; Hepatitis B; Hepatitis C Past Medical History Notes: gastric bypass Endocrine Medical History: Negative for: Type I Diabetes; Type II Diabetes Genitourinary Medical History: Negative for: End Stage Renal Disease Immunological Medical History: Negative for: Lupus Erythematosus; Raynauds; Scleroderma Integumentary (Skin) Medical History: Negative for: History of Burn Musculoskeletal Medical History: Positive for: Osteoarthritis Negative for: Gout; Rheumatoid Arthritis; Osteomyelitis Neurologic  Medical History: Negative for: Dementia; Neuropathy; Quadriplegia; Paraplegia; Seizure Disorder Oncologic Medical History: Negative for: Received Chemotherapy; Received Radiation Psychiatric Medical History: Negative for: Anorexia/bulimia; Confinement Anxiety Immunizations Pneumococcal Vaccine: Received Pneumococcal Vaccination: Yes Received Pneumococcal Vaccination On or After 60th Birthday: Yes Implantable Devices None Hospitalization / Surgery History Type of Hospitalization/Surgery right hip nailing 10/17/2019 05/17/2018 nephrolithotomy left 01/29/2017  right lymph noes biopsy cystoscopy 2017, 2018 2017 gastric bypass surgery Family and Social History Cancer: Yes; Diabetes: Yes - Father; Heart Disease: No; Hereditary Spherocytosis: No; Hypertension: Yes - Father; Kidney Disease: No; Lung Disease: No; Seizures: No; Stroke: No; Thyroid Problems: No; Tuberculosis: No; Current every day smoker; Marital Status - Divorced; Alcohol Use: Never; Drug Use: Prior History; Caffeine Use: Rarely; Financial Concerns: No; Food, Clothing or Shelter Needs: No; Support System Lacking: No; Transportation Concerns: No Electronic Signature(s) Signed: 02/17/2022 8:37:41 AM By: Fredirick Maudlin MD FACS [name] ([id number]) [id number] Page 12 of 12 Entered By: Fredirick Maudlin on 02/17/2022 08:14:03 -------------------------------------------------------------------------------- SuperBill Details Patient Name: Date of Service: [name], [name] 02/17/2022 Medical Record Number: [id number] Patient Account Number: [id number] Date of Birth/Sex: Treating RN: 01-12-1961 (61 y.o. [name], [name] Primary Care Provider: Evonnie Dawes, Jenny Reichmann Other Clinician: Referring Provider: Treating Provider/Extender: Evette Georges in Treatment: 2 Diagnosis Coding ICD-10 Codes Code Description [phone] Chronic venous hypertension (idiopathic) with ulcer and inflammation of right lower extremity L97.812 Non-pressure chronic ulcer of other part of right lower leg with fat layer exposed 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 Facility Procedures : CPT4 Code: [id number] Description: [place] - DEBRIDE WOUND 1ST 20 SQ CM OR < ICD-10 Diagnosis Description L97.812 Non-pressure chronic ulcer of other part of right lower leg with fat layer exposed L97.312 Non-pressure chronic ulcer of right ankle with fat layer  exposed Modifier: Quantity: 1 Physician Procedures : CPT4 Code Description Modifier [id number] - WC PHYS LEVEL 4 - EST PT 25 ICD-10 Diagnosis Description L97.812 Non-pressure chronic ulcer of other part of right lower leg with fat layer exposed L97.312 Non-pressure chronic ulcer of right ankle with  fat layer exposed L97.512 Non-pressure chronic ulcer of other part of right foot with fat layer exposed I87.331 Chronic venous hypertension (idiopathic) with ulcer and inflammation of right lower extremity Quantity: 1 : [id number] - WC PHYS DEBR WO ANESTH 20 SQ CM ICD-10 Diagnosis Description L97.812 Non-pressure chronic ulcer of other part of right lower leg with fat layer exposed L97.312 Non-pressure chronic ulcer of right ankle with fat layer exposed Quantity: 1 Electronic Signature(s) Signed: 02/17/2022 8:26:53 AM By: Fredirick Maudlin MD FACS Previous Signature: 02/17/2022 8:25:49 AM Version By: Fredirick Maudlin MD FACS Entered By: Fredirick Maudlin on 02/17/2022 08:26:52

## 2022-02-18 NOTE — Progress Notes (Signed)
BRAYON, [name] ([id number]) [id number] Page 1 of 13 Visit Report for 02/17/2022 Arrival Information Details Patient Name: Date of Service: [name], [place] J. 02/17/2022 7:30 A M Medical Record Number: [id number] Patient Account Number: [id number] Date of Birth/Sex: Treating RN: 1960/04/15 (61 y.o. [name], Meta.Reding Primary Care Dutch Ing: Evonnie Dawes, Jenny Reichmann Other Clinician: Referring Lexie Morini: Treating Starr Urias/Extender: Evette Georges in Treatment: 2 Visit Information History Since Last Visit Added or deleted any medications: No Patient Arrived: [name] Any new allergies or adverse reactions: No Arrival Time: 07:45 Had a fall or experienced change in No Accompanied By: self activities of daily living that may affect Transfer Assistance: None risk of falls: Patient Identification Verified: Yes Signs or symptoms of abuse/neglect since last visito No Secondary Verification Process Completed: Yes Hospitalized since last visit: No Patient Requires Transmission-Based Precautions: No Implantable device outside of the clinic excluding No Patient Has Alerts: No cellular tissue based products placed in the center since last visit: Has Dressing in Place as Prescribed: Yes Has Compression in Place as Prescribed: Yes Pain Present Now: Yes Electronic Signature(s) Signed: 02/17/2022 5:13:10 PM By: Deon Pilling RN, BSN Entered By: Deon Pilling on 02/17/2022 07:58:30 -------------------------------------------------------------------------------- Compression Therapy Details Patient Name: Date of Service: Marvin Atlas, [place] J. 02/17/2022 7:30 A M Medical Record Number: [id number] Patient Account Number: [id number] Date of Birth/Sex: Treating RN: 24-Jan-1961 (61 y.o. Hessie [name] Primary Care Thalia Turkington: Evonnie Dawes, Jenny Reichmann Other Clinician: Referring Dnya Hickle: Treating Xabi Wittler/Extender: Evette Georges in Treatment: 2 Compression  Therapy Performed for Wound Assessment: Wound #7 Right,Lateral Ankle Performed By: Clinician Deon Pilling, RN Compression Type: Four Layer Post Procedure Diagnosis Same as Pre-procedure Electronic Signature(s) Signed: 02/17/2022 5:13:10 PM By: Deon Pilling RN, BSN Entered By: Deon Pilling on 02/17/2022 08:12:06 Adah Salvage ([id number]) [id number] Page 2 of 13 -------------------------------------------------------------------------------- Compression Therapy Details Patient Name: Date of Service: [name], [name] 02/17/2022 7:30 A M Medical Record Number: [id number] Patient Account Number: [id number] Date of Birth/Sex: Treating RN: [birth year] (60 y.o. Hessie [name] Primary Care Lakashia Collison: Evonnie Dawes, Jenny Reichmann Other Clinician: Referring Crit Obremski: Treating [name]/Extender: Evette Georges in Treatment: 2 Compression Therapy Performed for Wound Assessment: Wound #8 Right,Posterior Lower Leg Performed By: Clinician Deon Pilling, RN Compression Type: Four Layer Post Procedure Diagnosis Same as Pre-procedure Electronic Signature(s) Signed: 02/17/2022 5:13:10 PM By: Deon Pilling RN, BSN Entered By: Deon Pilling on 02/17/2022 08:12:06 -------------------------------------------------------------------------------- Encounter Discharge Information Details Patient Name: Date of Service: Marvin Atlas, PA UL J. 02/17/2022 7:30 A M Medical Record Number: [id number] Patient Account Number: [id number] Date of Birth/Sex: Treating RN: [birth year] (61 y.o. Hessie [name] Primary Care Ardel Jagger: Evonnie Dawes, Jenny Reichmann Other Clinician: Referring Diavion Labrador: Treating Kelijah Towry/Extender: Evette Georges in Treatment: 2 Encounter Discharge Information Items Post Procedure Vitals Discharge Condition: Stable Temperature (F): 98.2 Ambulatory Status: Cane Pulse (bpm): 62 Discharge Destination: Home Respiratory Rate (breaths/min):  16 Transportation: Private Auto Blood Pressure (mmHg): 119/72 Accompanied By: self Schedule Follow-up Appointment: Yes Clinical Summary of Care: Electronic Signature(s) Signed: 02/17/2022 5:13:10 PM By: Deon Pilling RN, BSN Entered By: Deon Pilling on 02/17/2022 08:16:21 -------------------------------------------------------------------------------- Lower Extremity Assessment Details Patient Name: Date of Service: Marvin Atlas, PA UL J. 02/17/2022 7:30 A M Medical Record Number: [id number] Patient Account Number: [id number] Date of Birth/Sex: Treating RN: 08-03-1960 (61 y.o. Hessie [name] Primary Care Lalani Winkles: Evonnie Dawes, Jenny Reichmann Other Clinician: Referring [name]: Treating [name]/Extender: Evette Georges in  Treatment: 2 Edema Assessment Assessed: [Left: No] [Right: Yes] Edema: [Left: Ye] [Right: s] Calf SUVAN, STCYR ([id number]) [id number] Page 3 of 13 Left: Right: Point of Measurement: 42.5 cm From Medial Instep 39 cm Ankle Left: Right: Point of Measurement: 11.5 cm From Medial Instep 24.5 cm Vascular Assessment Pulses: Dorsalis Pedis Palpable: [Right:Yes] Electronic Signature(s) Signed: 02/17/2022 5:13:10 PM By: Deon Pilling RN, BSN Entered By: Deon Pilling on 02/17/2022 07:59:03 -------------------------------------------------------------------------------- Multi Wound Chart Details Patient Name: Date of Service: Marvin Atlas, PA UL J. 02/17/2022 7:30 A M Medical Record Number: [id number] Patient Account Number: [id number] Date of Birth/Sex: Treating RN: 12-07-1960 (61 y.o. M) Primary Care [name]: Evonnie Dawes, Jenny Reichmann Other Clinician: Referring [name]: Treating Giovoni Bunch/Extender: Evette Georges in Treatment: 2 Vital Signs Height(in): Pulse(bpm): 17 Weight(lbs): Blood Pressure(mmHg): 119/72 Body Mass Index(BMI): Temperature(F): 98.2 Respiratory Rate(breaths/min): 16 [5:Photos:] Right,  Lateral Lower Leg Right T - Web between 1st and 2nd oe Right, Lateral Ankle Wound Location: Trauma Gradually Appeared Gradually Appeared Wounding Event: Venous Leg Ulcer Lymphedema Venous Leg Ulcer Primary Etiology: Anemia, Sleep Apnea, Congestive Anemia, Sleep Apnea, Congestive Anemia, Sleep Apnea, Congestive Comorbid History: Heart Failure, Coronary Artery Heart Failure, Coronary Artery Heart Failure, Coronary Artery Disease, Peripheral Venous Disease, Disease, Peripheral Venous Disease, Disease, Peripheral Venous Disease, Osteoarthritis Osteoarthritis Osteoarthritis 02/03/2022 01/11/2022 01/11/2022 Date Acquired: _0 Weeks of Treatment: Open Open Open Wound Status: No No No Wound Recurrence: 3 N/A N/A Clustered Quantity: 0x0x0 0.9x0.9x0.1 1.1x0.6x0.1 Measurements L x W x D (cm) 0 0.636 0.518 A (cm) : rea 0 0.064 0.052 Volume (cm) : 100.00% 89.90% 75.60% % Reduction in Area: 100.00% 94.90% 87.70% % Reduction in Volume: Full Thickness With Exposed Support Full Thickness With Exposed Support Full Thickness With Exposed Support Classification: Structures Structures Structures None Present Medium Medium Exudate Amount: N/A Serosanguineous Serosanguineous Exudate Type: N/A red, brown red, brown Exudate Color: Distinct, outline attached Distinct, outline attached Distinct, outline attached Wound MarginDARRON, STUCK ([id number]) [id number] Page 4 of 13 None Present (0%) Medium (34-66%) Medium (34-66%) Granulation Amount: N/A Pink Red, Pink Granulation Quality: None Present (0%) Medium (34-66%) Medium (34-66%) Necrotic Amount: Fascia: No Fat Layer (Subcutaneous Tissue): Yes Fat Layer (Subcutaneous Tissue): Yes Exposed Structures: Fat Layer (Subcutaneous Tissue): No Fascia: No Fascia: No Tendon: No Tendon: No Tendon: No Muscle: No Muscle: No Muscle: No Joint: No Joint: No Joint: No Bone: No Bone: No Bone: No Large (67-100%)  Medium (34-66%) Small (1-33%) Epithelialization: N/A N/A Debridement - Excisional Debridement: Pre-procedure Verification/Time Out N/A N/A 08:05 Taken: N/A N/A Lidocaine 5% topical ointment Pain Control: N/A N/A Subcutaneous, Slough Tissue Debrided: N/A N/A Skin/Subcutaneous Tissue Level: N/A N/A 0.66 Debridement A (sq cm): rea N/A N/A Curette Instrument: N/A N/A Minimum Bleeding: N/A N/A Pressure Hemostasis A chieved: N/A N/A 0 Procedural Pain: N/A N/A 0 Post Procedural Pain: N/A N/A Procedure was tolerated well Debridement Treatment Response: N/A N/A 1.1x0.6x0.1 Post Debridement Measurements L x W x D (cm) N/A N/A 0.052 Post Debridement Volume: (cm) Excoriation: No Excoriation: No Excoriation: No Periwound Skin Texture: Induration: No Induration: No Induration: No Callus: No Callus: No Callus: No Crepitus: No Crepitus: No Crepitus: No Rash: No Rash: No Rash: No Scarring: No Scarring: No Scarring: No Maceration: No Maceration: Yes Maceration: No Periwound Skin Moisture: Dry/Scaly: No Dry/Scaly: No Dry/Scaly: No Atrophie Blanche: No Atrophie Blanche: No Atrophie Blanche: No Periwound Skin Color: Cyanosis: No Cyanosis: No Cyanosis: No Ecchymosis: No Ecchymosis: No Ecchymosis: No Erythema: No Erythema: No Erythema:  No Hemosiderin Staining: No Hemosiderin Staining: No Hemosiderin Staining: No Mottled: No Mottled: No Mottled: No Pallor: No Pallor: No Pallor: No Rubor: No Rubor: No Rubor: No No Abnormality No Abnormality No Abnormality Temperature: Yes Yes Yes Tenderness on Palpation: N/A N/A Compression Therapy Procedures Performed: Debridement Wound Number: 8 N/A N/A Photos: No Photos N/A N/A Right, Posterior Lower Leg N/A N/A Wound Location: Gradually Appeared N/A N/A Wounding Event: Venous Leg Ulcer N/A N/A Primary Etiology: Anemia, Sleep Apnea, Congestive N/A N/A Comorbid History: Heart Failure, Coronary  Artery Disease, Peripheral Venous Disease, Osteoarthritis 01/11/2021 N/A N/A Date Acquired: 1 N/A N/A Weeks of Treatment: Open N/A N/A Wound Status: No N/A N/A Wound Recurrence: N/A N/A N/A Clustered Quantity: 2.2x0.7x0.1 N/A N/A Measurements L x W x D (cm) 1.21 N/A N/A A (cm) : rea 0.121 N/A N/A Volume (cm) : 60.70% N/A N/A % Reduction in A rea: 60.70% N/A N/A % Reduction in Volume: Full Thickness Without Exposed N/A N/A Classification: Support Structures Medium N/A N/A Exudate A mount: Serosanguineous N/A N/A Exudate Type: red, brown N/A N/A Exudate Color: Distinct, outline attached N/A N/A Wound Margin: Large (67-100%) N/A N/A Granulation A mount: Red N/A N/A Granulation Quality: Small (1-33%) N/A N/A Necrotic A mount: Fat Layer (Subcutaneous Tissue): Yes N/A N/A Exposed Structures: Fascia: No Tendon: No Muscle: No Joint: No Bone: No Small (1-33%) N/A N/A Epithelialization: Debridement - Excisional N/A N/A Debridement: Pre-procedure Verification/Time Out 08:05 N/A N/A Taken: Lidocaine 5% topical ointment N/A N/A Pain Control: Subcutaneous, Slough N/A N/A Tissue Debrided: BADEN, BETSCH ([id number]) [id number] Page 5 of 13 Skin/Subcutaneous Tissue N/A N/A Level: 1.54 N/A N/A Debridement A (sq cm): rea Curette N/A N/A Instrument: Minimum N/A N/A Bleeding: Pressure N/A N/A Hemostasis Achieved: 0 N/A N/A Procedural Pain: 0 N/A N/A Post Procedural Pain: Procedure was tolerated well N/A N/A Debridement Treatment Response: 2.2x0.7x0.1 N/A N/A Post Debridement Measurements L x W x D (cm) 0.121 N/A N/A Post Debridement Volume: (cm) No Abnormalities Noted N/A N/A Periwound Skin Texture: Maceration: No N/A N/A Periwound Skin Moisture: Dry/Scaly: No Hemosiderin Staining: Yes N/A N/A Periwound Skin Color: No Abnormality N/A N/A Temperature: Yes N/A N/A Tenderness on Palpation: Compression Therapy N/A  N/A Procedures Performed: Debridement Treatment Notes Electronic Signature(s) Signed: 02/17/2022 8:12:40 AM By: Fredirick Maudlin MD FACS Entered By: Fredirick Maudlin on 02/17/2022 08:12:40 -------------------------------------------------------------------------------- Multi-Disciplinary Care Plan Details Patient Name: Date of Service: Marvin Atlas, PA UL J. 02/17/2022 7:30 A M Medical Record Number: [id number] Patient Account Number: [id number] Date of Birth/Sex: Treating RN: 05/03/1960 (61 y.o. [name], Meta.Reding Primary Care Rakhi Romagnoli: Evonnie Dawes, Jenny Reichmann Other Clinician: Referring Deseree Zemaitis: Treating Aniza Shor/Extender: Evette Georges in Treatment: 2 Multidisciplinary Care Plan reviewed with physician Active Inactive Venous Leg Ulcer Nursing Diagnoses: Actual venous Insuffiency (use after diagnosis is confirmed) Knowledge deficit related to disease process and management Potential for venous Insuffiency (use before diagnosis confirmed) Goals: Non-invasive venous studies are completed as ordered Date Initiated: 02/03/2022 Target Resolution Date: 03/13/2022 Goal Status: Active Patient will maintain optimal edema control Date Initiated: 02/03/2022 Target Resolution Date: 03/04/2022 Goal Status: Active Patient/caregiver will verbalize understanding of disease process and disease management Date Initiated: 02/03/2022 Date Inactivated: 02/17/2022 Target Resolution Date: 02/17/2022 Goal Status: Met Interventions: Assess peripheral edema status every visit. Compression as ordered Provide education on venous insufficiency Notes: Wound/Skin Impairment Nursing Diagnoses: JARIOUS, LYON ([id number]) [id number] Page 6 of 13 Impaired tissue integrity Knowledge deficit related to ulceration/compromised skin integrity Goals: Patient will have a decrease  in wound volume by X% from date: (specify in notes) Date Initiated: 02/03/2022 Target  Resolution Date: 03/13/2022 Goal Status: Active Patient/caregiver will verbalize understanding of skin care regimen Date Initiated: 02/03/2022 Target Resolution Date: 03/12/2022 Goal Status: Active Ulcer/skin breakdown will have a volume reduction of 30% by week 4 Date Initiated: 02/03/2022 Target Resolution Date: 02/26/2022 Goal Status: Active Interventions: Assess patient/caregiver ability to obtain necessary supplies Assess patient/caregiver ability to perform ulcer/skin care regimen upon admission and as needed Assess ulceration(s) every visit Notes: Electronic Signature(s) Signed: 02/17/2022 5:13:10 PM By: Deon Pilling RN, BSN Entered By: Deon Pilling on 02/17/2022 08:03:39 -------------------------------------------------------------------------------- Pain Assessment Details Patient Name: Date of Service: Marvin Atlas, PA UL J. 02/17/2022 7:30 A M Medical Record Number: [id number] Patient Account Number: [id number] Date of Birth/Sex: Treating RN: 1960-04-19 (61 y.o. Hessie [name] Primary Care Daejah Klebba: Evonnie Dawes, Jenny Reichmann Other Clinician: Referring Ernestene Coover: Treating Abigale Dorow/Extender: Evette Georges in Treatment: 2 Active Problems Location of Pain Severity and Description of Pain Patient Has Paino Yes Site Locations Pain Location: Pain in Ulcers Rate the pain. Current Pain Level: 8 Pain Management and Medication Current Pain Management: Medication: No Cold Application: No Rest: No Massage: No Activity: No T.E.N.S.: No Heat Application: No Leg drop or elevation: No Is the Current Pain Management Adequate: Adequate How does your wound impact your activities of daily livingo Sleep: No Bathing: No ZAYAAN, KOZAK ([id number]) [id number] Page 7 of 13 Appetite: No Relationship With Others: No Bladder Continence: No Emotions: No Bowel Continence: No Work: No Toileting: No Drive: No Dressing: No Hobbies:  No Engineer, maintenance) Signed: 02/17/2022 5:13:10 PM By: Deon Pilling RN, BSN Entered By: Deon Pilling on 02/17/2022 07:58:51 -------------------------------------------------------------------------------- Patient/Caregiver Education Details Patient Name: Date of Service: Marvin Atlas, PA UL J. 11/7/2023andnbsp7:30 A M Medical Record Number: [id number] Patient Account Number: [id number] Date of Birth/Gender: Treating RN: 14-Jan-1961 (61 y.o. Hessie [name] Primary Care Physician: Evonnie Dawes, Jenny Reichmann Other Clinician: Referring Physician: Treating Physician/Extender: Evette Georges in Treatment: 2 Education Assessment Education Provided To: Patient Education Topics Provided Venous: Handouts: Managing Venous Disease and Related Ulcers Methods: Explain/Verbal Responses: Reinforcements needed Electronic Signature(s) Signed: 02/17/2022 5:13:10 PM By: Deon Pilling RN, BSN Entered By: Deon Pilling on 02/17/2022 08:03:50 -------------------------------------------------------------------------------- Wound Assessment Details Patient Name: Date of Service: Marvin Atlas, PA UL J. 02/17/2022 7:30 A M Medical Record Number: [id number] Patient Account Number: [id number] Date of Birth/Sex: Treating RN: [birth year] (61 y.o. Hessie [name] Primary Care Keyri Salberg: Evonnie Dawes, Jenny Reichmann Other Clinician: Referring Kaleth Koy: Treating Deziyah Arvin/Extender: Evette Georges in Treatment: 2 Wound Status Wound Number: 5 Primary Venous Leg Ulcer Etiology: Wound Location: Right, Lateral Lower Leg Wound Open Wounding Event: Trauma Status: Date Acquired: 02/03/2022 Comorbid Anemia, Sleep Apnea, Congestive Heart Failure, Coronary Artery Weeks Of Treatment: 2 History: Disease, Peripheral Venous Disease, Osteoarthritis Clustered Wound: No Photos YOSHIMI, SARR ([id number]) [id number] Page 8 of 13 Wound Measurements Length: (cm) Width:  (cm) Depth: (cm) Clustered Quantity: Area: (cm) Volume: (cm) 0 % Reduction in Area: 100% 0 % Reduction in Volume: 100% 0 Epithelialization: Large (67-100%) 3 Tunneling: No 0 Undermining: No 0 Wound Description Classification: Full Thickness With Exposed Suppor Wound Margin: Distinct, outline attached Exudate Amount: None Present t Structures Foul Odor After Cleansing: No Slough/Fibrino No Wound Bed Granulation Amount: None Present (0%) Exposed Structure Necrotic Amount: None Present (0%) Fascia Exposed: No Fat Layer (Subcutaneous Tissue) Exposed: No Tendon Exposed: No Muscle Exposed: No Joint Exposed: No  Bone Exposed: No Periwound Skin Texture Texture Color No Abnormalities Noted: No No Abnormalities Noted: No Callus: No Atrophie Blanche: No Crepitus: No Cyanosis: No Excoriation: No Ecchymosis: No Induration: No Erythema: No Rash: No Hemosiderin Staining: No Scarring: No Mottled: No Pallor: No Moisture Rubor: No No Abnormalities Noted: No Dry / Scaly: No Temperature / Pain Maceration: No Temperature: No Abnormality Tenderness on Palpation: Yes Electronic Signature(s) Signed: 02/17/2022 5:13:10 PM By: Deon Pilling RN, BSN Entered By: Deon Pilling on 02/17/2022 08:00:23 -------------------------------------------------------------------------------- Wound Assessment Details Patient Name: Date of Service: Marvin Atlas, PA UL J. 02/17/2022 7:30 A M Medical Record Number: [id number] Patient Account Number: [id number] Date of Birth/Sex: Treating RN: 07-Aug-1960 (61 y.o. Hessie [name] Primary Care Vineeth Fell: Evonnie Dawes, Jenny Reichmann Other Clinician: Referring Mekiyah Gladwell: Treating Braylyn Eye/Extender: Evette Georges in Treatment: 2 Wound Status Wound Number: 6 Primary Lymphedema Etiology: JAQUAWN, SAFFRAN ([id number]) [id number] Page 9 of 13 Etiology: Wound Location: Right T - Web between 1st and 2nd oe Wound  Open Wounding Event: Gradually Appeared Status: Date Acquired: 01/11/2022 Comorbid Anemia, Sleep Apnea, Congestive Heart Failure, Coronary Artery Weeks Of Treatment: 2 History: Disease, Peripheral Venous Disease, Osteoarthritis Clustered Wound: No Photos Wound Measurements Length: (cm) 0.9 Width: (cm) 0.9 Depth: (cm) 0.1 Area: (cm) 0.636 Volume: (cm) 0.064 % Reduction in Area: 89.9% % Reduction in Volume: 94.9% Epithelialization: Medium (34-66%) Tunneling: No Undermining: No Wound Description Classification: Full Thickness With Exposed Support Structures Wound Margin: Distinct, outline attached Exudate Amount: Medium Exudate Type: Serosanguineous Exudate Color: red, brown Foul Odor After Cleansing: No Slough/Fibrino Yes Wound Bed Granulation Amount: Medium (34-66%) Exposed Structure Granulation Quality: Pink Fascia Exposed: No Necrotic Amount: Medium (34-66%) Fat Layer (Subcutaneous Tissue) Exposed: Yes Necrotic Quality: Adherent Slough Tendon Exposed: No Muscle Exposed: No Joint Exposed: No Bone Exposed: No Periwound Skin Texture Texture Color No Abnormalities Noted: No No Abnormalities Noted: No Callus: No Atrophie Blanche: No Crepitus: No Cyanosis: No Excoriation: No Ecchymosis: No Induration: No Erythema: No Rash: No Hemosiderin Staining: No Scarring: No Mottled: No Pallor: No Moisture Rubor: No No Abnormalities Noted: No Dry / Scaly: No Temperature / Pain Maceration: Yes Temperature: No Abnormality Tenderness on Palpation: Yes Treatment Notes Wound #6 (Toe - Web between 1st and 2nd) Wound Laterality: Right Cleanser Soap and Water Discharge Instruction: May shower and wash wound with dial antibacterial soap and water prior to dressing change. Wound Cleanser Discharge Instruction: Cleanse the wound with wound cleanser prior to applying a clean dressing using gauze sponges, not tissue or cotton balls. Peri-Wound Care Sween Lotion (Moisturizing  lotion) Discharge Instruction: Apply moisturizing lotion as directed Topical TAAVI, HOOSE ([id number]) [id number] Page 10 of 13 Mupirocin Ointment Discharge Instruction: Apply Mupirocin (Bactroban) as instructed Primary Dressing KerraCel Ag Gelling Fiber Dressing, 4x5 in (silver alginate) Discharge Instruction: Apply silver alginate to wound bed as instructed Secondary Dressing ABD Pad, 5x9 Discharge Instruction: Apply over primary dressing as directed. Woven Gauze Sponge, Non-Sterile 4x4 in Discharge Instruction: Apply over primary dressing as directed. Secured With Compression Wrap FourPress (4 layer compression wrap) Discharge Instruction: Apply four layer compression as directed. May also use Miliken CoFlex 2 layer compression system as alternative. Compression Stockings Add-Ons Electronic Signature(s) Signed: 02/17/2022 5:13:10 PM By: Deon Pilling RN, BSN Entered By: Deon Pilling on 02/17/2022 08:00:47 -------------------------------------------------------------------------------- Wound Assessment Details Patient Name: Date of Service: Marvin Atlas, PA UL J. 02/17/2022 7:30 A M Medical Record Number: [id number] Patient Account Number: [id number] Date of Birth/Sex: Treating RN: Mar 02, 1961 (61 y.o. M) Rolin Barry,  OKHTX Primary Care Henrry Feil: Evonnie Dawes, Jenny Reichmann Other Clinician: Referring Kyah Buesing: Treating Loghan Subia/Extender: Evette Georges in Treatment: 2 Wound Status Wound Number: 7 Primary Venous Leg Ulcer Etiology: Wound Location: Right, Lateral Ankle Wound Open Wounding Event: Gradually Appeared Status: Date Acquired: 01/11/2022 Comorbid Anemia, Sleep Apnea, Congestive Heart Failure, Coronary Artery Weeks Of Treatment: 2 History: Disease, Peripheral Venous Disease, Osteoarthritis Clustered Wound: No Photos Wound Measurements Length: (cm) 1.1 Width: (cm) 0.6 Depth: (cm) 0.1 Area: (cm) 0.518 Volume: (cm) 0.052 %  Reduction in Area: 75.6% % Reduction in Volume: 87.7% Epithelialization: Small (1-33%) Tunneling: No Undermining: No Wound Description VAYDEN, WEINAND ([id number]) Classification: Full Thickness With Exposed Support Structures Wound Margin: Distinct, outline attached Exudate Amount: Medium Exudate Type: Serosanguineous Exudate Color: red, brown [id number] Page 11 of 13 Foul Odor After Cleansing: No Slough/Fibrino Yes Wound Bed Granulation Amount: Medium (34-66%) Exposed Structure Granulation Quality: Red, Pink Fascia Exposed: No Necrotic Amount: Medium (34-66%) Fat Layer (Subcutaneous Tissue) Exposed: Yes Necrotic Quality: Adherent Slough Tendon Exposed: No Muscle Exposed: No Joint Exposed: No Bone Exposed: No Periwound Skin Texture Texture Color No Abnormalities Noted: No No Abnormalities Noted: No Callus: No Atrophie Blanche: No Crepitus: No Cyanosis: No Excoriation: No Ecchymosis: No Induration: No Erythema: No Rash: No Hemosiderin Staining: No Scarring: No Mottled: No Pallor: No Moisture Rubor: No No Abnormalities Noted: No Dry / Scaly: No Temperature / Pain Maceration: No Temperature: No Abnormality Tenderness on Palpation: Yes Treatment Notes Wound #7 (Ankle) Wound Laterality: Right, Lateral Cleanser Soap and Water Discharge Instruction: May shower and wash wound with dial antibacterial soap and water prior to dressing change. Wound Cleanser Discharge Instruction: Cleanse the wound with wound cleanser prior to applying a clean dressing using gauze sponges, not tissue or cotton balls. Peri-Wound Care Sween Lotion (Moisturizing lotion) Discharge Instruction: Apply moisturizing lotion as directed Topical Mupirocin Ointment Discharge Instruction: Apply Mupirocin (Bactroban) as instructed Primary Dressing KerraCel Ag Gelling Fiber Dressing, 4x5 in (silver alginate) Discharge Instruction: Apply silver alginate to wound bed as  instructed Secondary Dressing ABD Pad, 5x9 Discharge Instruction: Apply over primary dressing as directed. Woven Gauze Sponge, Non-Sterile 4x4 in Discharge Instruction: Apply over primary dressing as directed. Secured With Compression Wrap FourPress (4 layer compression wrap) Discharge Instruction: Apply four layer compression as directed. May also use Miliken CoFlex 2 layer compression system as alternative. Compression Stockings Add-Ons Electronic Signature(s) Signed: 02/17/2022 5:13:10 PM By: Deon Pilling RN, BSN Entered By: Deon Pilling on 02/17/2022 08:01:25 Adah Salvage ([id number]) [id number] Page 12 of 13 -------------------------------------------------------------------------------- Wound Assessment Details Patient Name: Date of Service: [name], [name] 02/17/2022 7:30 A M Medical Record Number: [id number] Patient Account Number: [id number] Date of Birth/Sex: Treating RN: 25-Oct-1960 (61 y.o. [name], Meta.Reding Primary Care Dmari Schubring: Evonnie Dawes, Jenny Reichmann Other Clinician: Referring Islam Eichinger: Treating Ismail Graziani/Extender: Evette Georges in Treatment: 2 Wound Status Wound Number: 8 Primary Venous Leg Ulcer Etiology: Wound Location: Right, Posterior Lower Leg Wound Open Wounding Event: Gradually Appeared Status: Date Acquired: 01/11/2021 Comorbid Anemia, Sleep Apnea, Congestive Heart Failure, Coronary Artery Weeks Of Treatment: 1 History: Disease, Peripheral Venous Disease, Osteoarthritis Clustered Wound: No Wound Measurements Length: (cm) 2.2 Width: (cm) 0.7 Depth: (cm) 0.1 Area: (cm) 1.21 Volume: (cm) 0.121 % Reduction in Area: 60.7% % Reduction in Volume: 60.7% Epithelialization: Small (1-33%) Tunneling: No Undermining: No Wound Description Classification: Full Thickness Without Exposed Support Structures Wound Margin: Distinct, outline attached Exudate Amount: Medium Exudate Type: Serosanguineous Exudate  Color: red, brown Foul  Odor After Cleansing: No Slough/Fibrino Yes Wound Bed Granulation Amount: Large (67-100%) Exposed Structure Granulation Quality: Red Fascia Exposed: No Necrotic Amount: Small (1-33%) Fat Layer (Subcutaneous Tissue) Exposed: Yes Necrotic Quality: Adherent Slough Tendon Exposed: No Muscle Exposed: No Joint Exposed: No Bone Exposed: No Periwound Skin Texture Texture Color No Abnormalities Noted: Yes No Abnormalities Noted: No Hemosiderin Staining: Yes Moisture No Abnormalities Noted: No Temperature / Pain Dry / Scaly: No Temperature: No Abnormality Maceration: No Tenderness on Palpation: Yes Treatment Notes Wound #8 (Lower Leg) Wound Laterality: Right, Posterior Cleanser Soap and Water Discharge Instruction: May shower and wash wound with dial antibacterial soap and water prior to dressing change. Wound Cleanser Discharge Instruction: Cleanse the wound with wound cleanser prior to applying a clean dressing using gauze sponges, not tissue or cotton balls. Peri-Wound Care Sween Lotion (Moisturizing lotion) Discharge Instruction: Apply moisturizing lotion as directed Topical Mupirocin Ointment Discharge Instruction: Apply Mupirocin (Bactroban) as instructed Primary Dressing TORON, BOWRING ([id number]) [id number] Page 13 of 13 KerraCel Ag Gelling Fiber Dressing, 4x5 in (silver alginate) Discharge Instruction: Apply silver alginate to wound bed as instructed Secondary Dressing ABD Pad, 5x9 Discharge Instruction: Apply over primary dressing as directed. Woven Gauze Sponge, Non-Sterile 4x4 in Discharge Instruction: Apply over primary dressing as directed. Secured With Compression Wrap FourPress (4 layer compression wrap) Discharge Instruction: Apply four layer compression as directed. May also use Miliken CoFlex 2 layer compression system as alternative. Compression Stockings Add-Ons Electronic Signature(s) Signed: 02/17/2022  5:13:10 PM By: Deon Pilling RN, BSN Entered By: Deon Pilling on 02/17/2022 08:01:07 -------------------------------------------------------------------------------- Vitals Details Patient Name: Date of Service: Marvin Atlas, [place] UL J. 02/17/2022 7:30 A M Medical Record Number: [id number] Patient Account Number: [id number] Date of Birth/Sex: Treating RN: 03-Dec-1960 (61 y.o. Hessie [name] Primary Care Ithan Touhey: Evonnie Dawes, Jenny Reichmann Other Clinician: Referring Winter Trefz: Treating Shantera Monts/Extender: Evette Georges in Treatment: 2 Vital Signs Time Taken: 07:45 Temperature (F): 98.2 Pulse (bpm): 62 Respiratory Rate (breaths/min): 16 Blood Pressure (mmHg): 119/72 Reference Range: 80 - 120 mg / dl Electronic Signature(s) Signed: 02/17/2022 5:13:10 PM By: Deon Pilling RN, BSN Entered By: Deon Pilling on 02/17/2022 07:58:41

## 2022-02-19 ENCOUNTER — Ambulatory Visit (HOSPITAL_COMMUNITY)
Admission: RE | Admit: 2022-02-19 | Discharge: 2022-02-19 | Disposition: A | Discharge status: Home / Self Care | Payer: PPO | Source: Ambulatory Visit | Attending: Vascular Surgery | Admitting: Vascular Surgery

## 2022-02-19 ENCOUNTER — Other Ambulatory Visit (HOSPITAL_COMMUNITY): Payer: Self-pay | Admitting: General Surgery

## 2022-02-19 ENCOUNTER — Encounter (HOSPITAL_BASED_OUTPATIENT_CLINIC_OR_DEPARTMENT_OTHER): Payer: PPO | Admitting: General Surgery

## 2022-02-19 DIAGNOSIS — I87331 Chronic venous hypertension (idiopathic) with ulcer and inflammation of right lower extremity: Secondary | ICD-10-CM

## 2022-02-23 DIAGNOSIS — F1111 Opioid abuse, in remission: Secondary | ICD-10-CM | POA: Diagnosis not present

## 2022-02-23 NOTE — Progress Notes (Signed)
ARVO, EALY ([id number]) [id number] Page 1 of 7 Visit Report for 02/19/2022 Arrival Information Details Patient Name: Date of Service: Marvin Marvin, Marvin Marvin. 02/19/2022 10:30 A M Medical Record Number: [id number] Patient Account Number: [id number] Date of Birth/Sex: Treating RN: March 29, 1961 (61 y.o. M) Primary Care Marvin Marvin: Marvin Marvin, Marvin Marvin Other Clinician: Referring Marvin Marvin: Treating Marvin Marvin/Extender: Marvin Marvin: 2 Visit Information History Since Last Visit All ordered tests and consults were completed: No Patient Arrived: Ambulatory Added or deleted any medications: No Arrival Time: 10:31 Any new allergies or adverse reactions: No Accompanied By: self Had a fall or experienced change in No Transfer Assistance: None activities of daily living that may affect Patient Identification Verified: Yes risk of falls: Secondary Verification Process Completed: Yes Signs or symptoms of abuse/neglect since last visito No Patient Requires Transmission-Based Precautions: No Hospitalized since last visit: No Patient Has Alerts: No Implantable device outside of the clinic excluding No cellular tissue based products placed in the center since last visit: Pain Present Now: No Electronic Signature(s) Signed: 02/19/2022 11:22:30 AM By: Worthy Rancher Entered By: Worthy Rancher on 02/19/2022 10:31:35 -------------------------------------------------------------------------------- Compression Therapy Details Patient Name: Date of Service: Marvin Atlas, [place] UL [name] 02/19/2022 10:30 A M Medical Record Number: [id number] Patient Account Number: [id number] Date of Birth/Sex: Treating RN: 03-24-1961 (61 y.o. Marvin Marvin Primary Care Marvin Marvin: Marvin Marvin, Marvin Marvin Other Clinician: Referring Marvin Marvin: Treating Marvin Marvin/Extender: Marvin Marvin: 2 Compression Therapy Performed for Wound Assessment: Wound #8  Right,Posterior Lower Leg Performed By: Clinician Blanche East, RN Compression Type: Four Layer Electronic Signature(s) Signed: 02/23/2022 5:04:49 PM By: Blanche East RN Entered By: Blanche East on 02/19/2022 11:06:21 -------------------------------------------------------------------------------- Encounter Discharge Information Details Patient Name: Date of Service: Marvin Atlas, PA UL [name] 02/19/2022 10:30 A M Medical Record Number: [id number] Patient Account Number: [id number] Date of Birth/Sex: Treating RN: 01-Aug-1960 (61 y.o. Marvin, Marvin ([id number]) [id number] Page 2 of 7 Primary Care Marvin Marvin: Marvin Marvin, Marvin Marvin Other Clinician: Referring Marvin Marvin: Treating Marvin Marvin/Extender: Marvin Marvin: 2 Encounter Discharge Information Items Discharge Condition: Stable Ambulatory Status: Cane Discharge Destination: Home Transportation: Private Auto Accompanied By: self Schedule Follow-up Appointment: Yes Clinical Summary of Care: Electronic Signature(s) Signed: 02/23/2022 5:04:49 PM By: Blanche East RN Entered By: Blanche East on 02/19/2022 11:06:52 -------------------------------------------------------------------------------- Patient/Caregiver Education Details Patient Name: Date of Service: Marvin Atlas, PA UL [name] 11/9/2023andnbsp10:30 A M Medical Record Number: [id number] Patient Account Number: [id number] Date of Birth/Gender: Treating RN: 09/03/1960 (61 y.o. Marvin Marvin Primary Care Physician: Marvin Marvin, Marvin Marvin Other Clinician: Referring Physician: Treating Physician/Extender: Marvin Marvin: 2 Education Assessment Education Provided To: Patient Education Topics Provided Venous: Methods: Explain/Verbal Responses: Reinforcements needed, State content correctly Electronic Signature(s) Signed: 02/23/2022 5:04:49 PM By: Blanche East RN Entered By: Blanche East on 02/19/2022 11:06:41 -------------------------------------------------------------------------------- Wound Assessment Details Patient Name: Date of Service: Marvin Atlas, PA UL [name] 02/19/2022 10:30 A M Medical Record Number: [id number] Patient Account Number: [id number] Date of Birth/Sex: Treating RN: [birth year] (61 y.o. Marvin Marvin Primary Care Marvin Marvin: Marvin Marvin, Marvin Marvin Other Clinician: Referring Marvin Marvin: Treating Marvin Marvin/Extender: Marvin Marvin: 2 Wound Status Wound Number: 6 Primary Lymphedema Etiology: Wound Location: Right T - Web between 1st and 2nd oe Wound Open Wounding Event: Gradually Appeared Status: Date Acquired: 01/11/2022 Comorbid Anemia, Sleep Apnea, Congestive Heart Failure, Coronary Artery Weeks Of Marvin: 2 Marvin Penman  [name] ([id number]) [id number] Page 3 of 7 Weeks Of Marvin: 2 History: Disease, Peripheral Venous Disease, Osteoarthritis Clustered Wound: No Wound Measurements Length: (cm) 0.9 Width: (cm) 0.9 Depth: (cm) 0.1 Area: (cm) 0.636 Volume: (cm) 0.064 % Reduction in Area: 89.9% % Reduction in Volume: 94.9% Epithelialization: Medium (34-66%) Tunneling: No Undermining: No Wound Description Classification: Full Thickness With Exposed Suppo Wound Margin: Distinct, outline attached Exudate Amount: Medium Exudate Type: Serosanguineous Exudate Color: red, brown rt Structures Foul Odor After Cleansing: No Slough/Fibrino Yes Wound Bed Granulation Amount: Medium (34-66%) Exposed Structure Granulation Quality: Pink Fascia Exposed: No Necrotic Amount: Medium (34-66%) Fat Layer (Subcutaneous Tissue) Exposed: Yes Necrotic Quality: Adherent Slough Tendon Exposed: No Muscle Exposed: No Joint Exposed: No Bone Exposed: No Periwound Skin Texture Texture Color No Abnormalities Noted: No No Abnormalities Noted: No Callus: No Atrophie Blanche: No Crepitus: No Cyanosis:  No Excoriation: No Ecchymosis: No Induration: No Erythema: No Rash: No Hemosiderin Staining: No Scarring: No Mottled: No Pallor: No Moisture Rubor: No No Abnormalities Noted: No Dry / Scaly: No Temperature / Pain Maceration: Yes Temperature: No Abnormality Tenderness on Palpation: Yes Marvin Marvin Wound #6 (Toe - Web between 1st and 2nd) Wound Laterality: Right Cleanser Soap and Water Discharge Instruction: May shower and wash wound with dial antibacterial soap and water prior to dressing change. Wound Cleanser Discharge Instruction: Cleanse the wound with wound cleanser prior to applying a clean dressing using gauze sponges, not tissue or cotton balls. Peri-Wound Care Sween Lotion (Moisturizing lotion) Discharge Instruction: Apply moisturizing lotion as directed Topical Mupirocin Ointment Discharge Instruction: Apply Mupirocin (Bactroban) as instructed Primary Dressing KerraCel Ag Gelling Fiber Dressing, 4x5 in (silver alginate) Discharge Instruction: Apply silver alginate to wound bed as instructed Secondary Dressing ABD Pad, 5x9 Discharge Instruction: Apply over primary dressing as directed. Woven Gauze Sponge, Non-Sterile 4x4 in Discharge Instruction: Apply over primary dressing as directed. Secured With Compression Wrap FourPress (4 layer compression wrap) MANSOUR, BALBOA ([id number]) [id number] Page 4 of 7 Discharge Instruction: Apply four layer compression as directed. May also use Miliken CoFlex 2 layer compression system as alternative. Compression Stockings Add-Ons Electronic Signature(s) Signed: 02/23/2022 5:04:49 PM By: Blanche East RN Entered By: Blanche East on 02/19/2022 11:05:53 -------------------------------------------------------------------------------- Wound Assessment Details Patient Name: Date of Service: Marvin Atlas, PA UL [name] 02/19/2022 10:30 A M Medical Record Number: [id number] Patient Account Number:  [id number] Date of Birth/Sex: Treating RN: 10/05/1960 (61 y.o. Marvin Marvin Primary Care Keta [place]: Marvin Marvin, Marvin Marvin Other Clinician: Referring Destine Ambroise: Treating Cire Clute/Extender: Marvin Marvin: 2 Wound Status Wound Number: 7 Primary Venous Leg Ulcer Etiology: Wound Location: Right, Lateral Ankle Wound Open Wounding Event: Gradually Appeared Status: Date Acquired: 01/11/2022 Comorbid Anemia, Sleep Apnea, Congestive Heart Failure, Coronary Artery Weeks Of Marvin: 2 History: Disease, Peripheral Venous Disease, Osteoarthritis Clustered Wound: No Wound Measurements Length: (cm) 1.1 Width: (cm) 0.6 Depth: (cm) 0.1 Area: (cm) 0.518 Volume: (cm) 0.052 % Reduction in Area: 75.6% % Reduction in Volume: 87.7% Epithelialization: Small (1-33%) Tunneling: No Undermining: No Wound Description Classification: Full Thickness With Exposed Suppo Wound Margin: Distinct, outline attached Exudate Amount: Medium Exudate Type: Serosanguineous Exudate Color: red, brown rt Structures Foul Odor After Cleansing: No Slough/Fibrino Yes Wound Bed Granulation Amount: Medium (34-66%) Exposed Structure Granulation Quality: Red, Pink Fascia Exposed: No Necrotic Amount: Medium (34-66%) Fat Layer (Subcutaneous Tissue) Exposed: Yes Necrotic Quality: Adherent Slough Tendon Exposed: No Muscle Exposed: No Joint Exposed: No Bone Exposed: No Periwound Skin Texture Texture Color No Abnormalities Noted: No  No Abnormalities Noted: No Callus: No Atrophie Blanche: No Crepitus: No Cyanosis: No Excoriation: No Ecchymosis: No Induration: No Erythema: No Rash: No Hemosiderin Staining: No Scarring: No Mottled: No Pallor: No Moisture Rubor: No No Abnormalities Noted: No Dry / Scaly: No Temperature / Pain Maceration: No Temperature: No Abnormality Tenderness on PalpationLENOARD, HELBERT ([id number]) [id number] Page 5 of  7 Marvin Marvin Wound #7 (Ankle) Wound Laterality: Right, Lateral Cleanser Soap and Water Discharge Instruction: May shower and wash wound with dial antibacterial soap and water prior to dressing change. Wound Cleanser Discharge Instruction: Cleanse the wound with wound cleanser prior to applying a clean dressing using gauze sponges, not tissue or cotton balls. Peri-Wound Care Sween Lotion (Moisturizing lotion) Discharge Instruction: Apply moisturizing lotion as directed Topical Mupirocin Ointment Discharge Instruction: Apply Mupirocin (Bactroban) as instructed Primary Dressing KerraCel Ag Gelling Fiber Dressing, 4x5 in (silver alginate) Discharge Instruction: Apply silver alginate to wound bed as instructed Secondary Dressing ABD Pad, 5x9 Discharge Instruction: Apply over primary dressing as directed. Woven Gauze Sponge, Non-Sterile 4x4 in Discharge Instruction: Apply over primary dressing as directed. Secured With Compression Wrap FourPress (4 layer compression wrap) Discharge Instruction: Apply four layer compression as directed. May also use Miliken CoFlex 2 layer compression system as alternative. Compression Stockings Add-Ons Electronic Signature(s) Signed: 02/23/2022 5:04:49 PM By: Blanche East RN Entered By: Blanche East on 02/19/2022 11:05:59 -------------------------------------------------------------------------------- Wound Assessment Details Patient Name: Date of Service: Marvin Atlas, PA UL [name] 02/19/2022 10:30 A M Medical Record Number: [id number] Patient Account Number: [id number] Date of Birth/Sex: Treating RN: March 01, 1961 (61 y.o. Marvin Marvin Primary Care Korynn Kenedy: Marvin Marvin, Marvin Marvin Other Clinician: Referring Skyleen Bentley: Treating Kaegan Stigler/Extender: Marvin Marvin: 2 Wound Status Wound Number: 8 Primary Venous Leg Ulcer Etiology: Wound Location: Right, Posterior Lower Leg Wound Open Wounding Event: Gradually  Appeared Status: Date Acquired: 01/11/2021 Comorbid Anemia, Sleep Apnea, Congestive Heart Failure, Coronary Artery Weeks Of Marvin: 1 History: Disease, Peripheral Venous Disease, Osteoarthritis Clustered Wound: No Wound Measurements Length: (cm) 2.2 Width: (cm) 0.7 Depth: (cm) 0.1 Area: (cm) 1.21 Volume: (cm) 0.121 MARCIANO, MUNDT ([id number]) Wound Description Classification: Full Thickness Without Exposed Support Structures Wound Margin: Distinct, outline attached Exudate Amount: Medium Exudate Type: Serosanguineous Exudate Color: red, brown Foul Odor After Cleansing: No Slough/Fibrino Yes % Reduction in Area: 60.7% % Reduction in Volume: 60.7% Epithelialization: Small (1-33%) Tunneling: No Undermining: No [id number] Page 6 of 7 Wound Bed Granulation Amount: Large (67-100%) Exposed Structure Granulation Quality: Red Fascia Exposed: No Necrotic Amount: Small (1-33%) Fat Layer (Subcutaneous Tissue) Exposed: Yes Necrotic Quality: Adherent Slough Tendon Exposed: No Muscle Exposed: No Joint Exposed: No Bone Exposed: No Periwound Skin Texture Texture Color No Abnormalities Noted: Yes No Abnormalities Noted: No Hemosiderin Staining: Yes Moisture No Abnormalities Noted: No Temperature / Pain Dry / Scaly: No Temperature: No Abnormality Maceration: No Tenderness on Palpation: Yes Marvin Marvin Wound #8 (Lower Leg) Wound Laterality: Right, Posterior Cleanser Soap and Water Discharge Instruction: May shower and wash wound with dial antibacterial soap and water prior to dressing change. Wound Cleanser Discharge Instruction: Cleanse the wound with wound cleanser prior to applying a clean dressing using gauze sponges, not tissue or cotton balls. Peri-Wound Care Sween Lotion (Moisturizing lotion) Discharge Instruction: Apply moisturizing lotion as directed Topical Mupirocin Ointment Discharge Instruction: Apply Mupirocin (Bactroban) as  instructed Primary Dressing KerraCel Ag Gelling Fiber Dressing, 4x5 in (silver alginate) Discharge Instruction: Apply silver alginate to wound bed as instructed Secondary Dressing ABD Pad,  5x9 Discharge Instruction: Apply over primary dressing as directed. Woven Gauze Sponge, Non-Sterile 4x4 in Discharge Instruction: Apply over primary dressing as directed. Secured With Compression Wrap FourPress (4 layer compression wrap) Discharge Instruction: Apply four layer compression as directed. May also use Miliken CoFlex 2 layer compression system as alternative. Compression Stockings Add-Ons Electronic Signature(s) Signed: 02/23/2022 5:04:49 PM By: Blanche East RN Entered By: Blanche East on 02/19/2022 [place], Alyus [name] ([id number]) [id number] Page 7 of 7 -------------------------------------------------------------------------------- Vitals Details Patient Name: Date of Service: Marvin Marvin, Marvin UL [name] 02/19/2022 10:30 A M Medical Record Number: [id number] Patient Account Number: [id number] Date of Birth/Sex: Treating RN: Jun 13, 1960 (61 y.o. M) Primary Care Cincere Zorn: Marvin Marvin, Marvin Marvin Other Clinician: Referring Jairy Angulo: Treating Chloey Ricard/Extender: Marvin Marvin: 2 Vital Signs Time Taken: 10:30 Temperature (F): 98.3 Pulse (bpm): 62 Respiratory Rate (breaths/min): 20 Blood Pressure (mmHg): 108/68 Reference Range: 80 - 120 mg / dl Electronic Signature(s) Signed: 02/19/2022 11:22:30 AM By: Worthy Rancher Entered By: Worthy Rancher on 02/19/2022 10:32:02

## 2022-02-23 NOTE — Progress Notes (Signed)
JASRAJ, LAPPE ([id number]) [id number] Page 1 of 1 Visit Report for 02/19/2022 SuperBill Details Patient Name: Date of Service: [name], [place] J. 02/19/2022 Medical Record Number: [id number] Patient Account Number: [id number] Date of Birth/Sex: Treating RN: 07-01-1960 (61 y.o. 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

## 2022-02-25 ENCOUNTER — Encounter (HOSPITAL_BASED_OUTPATIENT_CLINIC_OR_DEPARTMENT_OTHER): Payer: PPO | Admitting: General Surgery

## 2022-02-25 DIAGNOSIS — L97312 Non-pressure chronic ulcer of right ankle with fat layer exposed: Secondary | ICD-10-CM | POA: Diagnosis not present

## 2022-02-25 DIAGNOSIS — L97212 Non-pressure chronic ulcer of right calf with fat layer exposed: Secondary | ICD-10-CM | POA: Diagnosis not present

## 2022-02-25 DIAGNOSIS — I87331 Chronic venous hypertension (idiopathic) with ulcer and inflammation of right lower extremity: Secondary | ICD-10-CM | POA: Diagnosis not present

## 2022-02-25 DIAGNOSIS — I872 Venous insufficiency (chronic) (peripheral): Secondary | ICD-10-CM | POA: Diagnosis not present

## 2022-02-26 NOTE — Progress Notes (Signed)
[name], [name] ([id number]) [id number] Page 1 of 11 Visit Report for 02/25/2022 Arrival Information Details Patient Name: Date of Service: [name], [place] [name] 02/25/2022 8:00 A M Medical Record Number: [id number] Patient Account Number: [id number] Date of Birth/Sex: Treating RN: 08-18-1960 (61 y.o. [name] Primary Care Audi Conover: Evonnie Dawes, Jenny Reichmann Other Clinician: Referring Paityn Balsam: Treating Loraine Bhullar/Extender: Evette Georges in Treatment: 3 Visit Information History Since Last Visit Added or deleted any medications: No Patient Arrived: Ambulatory Any new allergies or adverse reactions: No Arrival Time: 07:52 Had a fall or experienced change in No Accompanied By: father activities of daily living that may affect Transfer Assistance: None risk of falls: Patient Requires Transmission-Based Precautions: No Signs or symptoms of abuse/neglect since last visito No Patient Has Alerts: No Hospitalized since last visit: No Implantable device outside of the clinic excluding No cellular tissue based products placed in the center since last visit: Has Compression in Place as Prescribed: Yes Pain Present Now: Yes Electronic Signature(s) Signed: 02/25/2022 5:14:53 PM By: Blanche East RN Entered By: Blanche East on 02/25/2022 07:52:41 -------------------------------------------------------------------------------- Compression Therapy Details Patient Name: Date of Service: Marvin Atlas, [place] [name] 02/25/2022 8:00 A M Medical Record Number: [id number] Patient Account Number: [id number] Date of Birth/Sex: Treating RN: 1960-09-22 (61 y.o. [name] Primary Care Percy Comp: Evonnie Dawes, Jenny Reichmann Other Clinician: Referring Emitt Maglione: Treating Mirabelle Cyphers/Extender: Evette Georges in Treatment: 3 Compression Therapy Performed for Wound Assessment: Wound #8 Right,Posterior Lower Leg Performed By: Clinician Blanche East,  RN Compression Type: Four Layer Post Procedure Diagnosis Same as Pre-procedure Electronic Signature(s) Signed: 02/25/2022 5:14:53 PM By: Blanche East RN Entered By: Blanche East on 02/25/2022 16:30:12 [name] ([id number]) [id number] Page 2 of 11 -------------------------------------------------------------------------------- Encounter Discharge Information Details Patient Name: Date of Service: [name], [place] [name] 02/25/2022 8:00 A M Medical Record Number: [id number] Patient Account Number: [id number] Date of Birth/Sex: Treating RN: 05/06/1960 (61 y.o. [name] Primary Care Dot Splinter: Evonnie Dawes, Jenny Reichmann Other Clinician: Referring Zayed Griffie: Treating Zerina Hallinan/Extender: Evette Georges in Treatment: 3 Encounter Discharge Information Items Post Procedure Vitals Discharge Condition: Stable Temperature (F): 98.0 Ambulatory Status: Ambulatory Pulse (bpm): 69 Discharge Destination: Home Respiratory Rate (breaths/min): 18 Transportation: Private Auto Blood Pressure (mmHg): 106/68 Accompanied By: father Schedule Follow-up Appointment: Yes Clinical Summary of Care: Electronic Signature(s) Signed: 02/25/2022 5:14:53 PM By: Blanche East RN Entered By: Blanche East on 02/25/2022 08:53:48 -------------------------------------------------------------------------------- Lower Extremity Assessment Details Patient Name: Date of Service: Marvin Atlas, PA [place] [name] 02/25/2022 8:00 A M Medical Record Number: [id number] Patient Account Number: [id number] Date of Birth/Sex: Treating RN: February 27, 1961 (61 y.o. [name] Primary Care Zoei Amison: Evonnie Dawes, Jenny Reichmann Other Clinician: Referring Keva Darty: Treating Makilah Dowda/Extender: Evette Georges in Treatment: 3 Edema Assessment Assessed: [Left: No] [Right: No] Edema: [Left: Ye] [Right: s] Calf Left: Right: Point of Measurement: 42.5 cm From Medial Instep 39  cm Ankle Left: Right: Point of Measurement: 11.5 cm From Medial Instep 24.5 cm Vascular Assessment Pulses: Dorsalis Pedis Palpable: [Right:Yes] Electronic Signature(s) Signed: 02/25/2022 5:14:53 PM By: Blanche East RN Entered By: Blanche East on 02/25/2022 08:02:25 Multi Wound Chart Details -------------------------------------------------------------------------------- [name] ([id number]) [id number] Page 3 of 11 Patient Name: Date of Service: [name], [place] [name] 02/25/2022 8:00 A M Medical Record Number: [id number] Patient Account Number: [id number] Date of Birth/Sex: Treating RN: 1960-11-28 (61 y.o. M) Primary Care Railey Glad: Evonnie Dawes, Jenny Reichmann Other Clinician: Referring Gussie Murton: Treating Ayala Ribble/Extender:  Fredirick Maudlin Evonnie Dawes, John Weeks in Treatment: 3 Vital Signs Height(in): Pulse(bpm): 27 Weight(lbs): Blood Pressure(mmHg): 106/68 Body Mass Index(BMI): Temperature(F): 98.0 Respiratory Rate(breaths/min): 18 Wound Assessments Wound Number: _0 Photos: Right T - Web between 1st and 2nd Right, Lateral Ankle oe Right, Posterior Lower Leg Wound Location: Gradually Appeared Gradually Appeared Gradually Appeared Wounding Event: Lymphedema Venous Leg Ulcer Venous Leg Ulcer Primary Etiology: Anemia, Sleep Apnea, Congestive Anemia, Sleep Apnea, Congestive Anemia, Sleep Apnea, Congestive Comorbid History: Heart Failure, Coronary Artery Heart Failure, Coronary Artery Heart Failure, Coronary Artery Disease, Peripheral Venous Disease, Disease, Peripheral Venous Disease, Disease, Peripheral Venous Disease, Osteoarthritis Osteoarthritis Osteoarthritis 01/11/2022 01/11/2022 01/11/2021 Date Acquired: _1 Weeks of Treatment: Open Open Open Wound Status: No No No Wound Recurrence: 0.5x0.4x0.1 1x1x0.1 1x0.5x0.1 Measurements L x W x D (cm) 0.157 0.785 0.393 A (cm) : rea 0.016 0.079 0.039 Volume (cm) : 97.50% 63.00% 87.20% %  Reduction in A rea: 98.70% 81.40% 87.30% % Reduction in Volume: Full Thickness With Exposed Support Full Thickness With Exposed Support Full Thickness Without Exposed Classification: Structures Structures Support Structures Medium Medium Medium Exudate A mount: Serosanguineous Serosanguineous Serosanguineous Exudate Type: red, brown red, brown red, brown Exudate Color: Distinct, outline attached Distinct, outline attached Distinct, outline attached Wound Margin: Medium (34-66%) Medium (34-66%) Large (67-100%) Granulation A mount: Pink Red, Pink Red Granulation Quality: Medium (34-66%) Medium (34-66%) Small (1-33%) Necrotic A mount: Fat Layer (Subcutaneous Tissue): Yes Fat Layer (Subcutaneous Tissue): Yes Fat Layer (Subcutaneous Tissue): Yes Exposed Structures: Fascia: No Fascia: No Fascia: No Tendon: No Tendon: No Tendon: No Muscle: No Muscle: No Muscle: No Joint: No Joint: No Joint: No Bone: No Bone: No Bone: No Medium (34-66%) Small (1-33%) Small (1-33%) Epithelialization: N/A Debridement - Selective/Open Wound Debridement - Selective/Open Wound Debridement: Pre-procedure Verification/Time Out N/A 08:14 08:14 Taken: N/A Lidocaine 5% topical ointment Lidocaine 5% topical ointment Pain Control: N/A Necrotic/Eschar, Slough Necrotic/Eschar Tissue Debrided: N/A Non-Viable Tissue Non-Viable Tissue Level: N/A 1 0.5 Debridement A (sq cm): rea N/A Curette Curette Instrument: N/A Minimum Minimum Bleeding: N/A Pressure Pressure Hemostasis A chieved: N/A Procedure was tolerated well Procedure was tolerated well Debridement Treatment Response: N/A 1x1x0.1 1x0.5x0.1 Post Debridement Measurements L x W x D (cm) N/A 0.079 0.039 Post Debridement Volume: (cm) Excoriation: No Excoriation: No No Abnormalities Noted Periwound Skin Texture: Induration: No Induration: No Callus: No Callus: No Crepitus: No Crepitus: No Rash: No Rash: No Scarring: No Scarring:  No Maceration: Yes Maceration: No Maceration: No Periwound Skin Moisture: Dry/Scaly: No Dry/Scaly: No Dry/Scaly: No Atrophie Blanche: No Atrophie Blanche: No Hemosiderin Staining: Yes Periwound Skin Color: Cyanosis: No Cyanosis: No Ecchymosis: No Ecchymosis: No [name], [name] ([id number]) [id number] Page 4 of 11 Erythema: No Erythema: No Hemosiderin Staining: No Hemosiderin Staining: No Mottled: No Mottled: No Pallor: No Pallor: No Rubor: No Rubor: No No Abnormality No Abnormality No Abnormality Temperature: Yes Yes Yes Tenderness on Palpation: N/A Debridement Debridement Procedures Performed: Treatment Notes Electronic Signature(s) Signed: 02/25/2022 8:20:04 AM By: Fredirick Maudlin MD FACS Entered By: Fredirick Maudlin on 02/25/2022 08:20:04 -------------------------------------------------------------------------------- Multi-Disciplinary Care Plan Details Patient Name: Date of Service: Marvin Atlas, PA [place] [name] 02/25/2022 8:00 A M Medical Record Number: [id number] Patient Account Number: [id number] Date of Birth/Sex: Treating RN: 1961/03/10 (61 y.o. [name] Primary Care Jd Mccaster: Evonnie Dawes, Jenny Reichmann Other Clinician: Referring Cassell Voorhies: Treating Jaelyn Cloninger/Extender: Evette Georges in Treatment: 3 Multidisciplinary Care Plan reviewed with physician Active Inactive Venous Leg Ulcer Nursing Diagnoses: Actual venous Insuffiency (  use after diagnosis is confirmed) Knowledge deficit related to disease process and management Potential for venous Insuffiency (use before diagnosis confirmed) Goals: Non-invasive venous studies are completed as ordered Date Initiated: 02/03/2022 Target Resolution Date: 03/13/2022 Goal Status: Active Patient will maintain optimal edema control Date Initiated: 02/03/2022 Target Resolution Date: 03/04/2022 Goal Status: Active Patient/caregiver will verbalize understanding of disease process  and disease management Date Initiated: 02/03/2022 Date Inactivated: 02/17/2022 Target Resolution Date: 02/17/2022 Goal Status: Met Interventions: Assess peripheral edema status every visit. Compression as ordered Provide education on venous insufficiency Notes: Wound/Skin Impairment Nursing Diagnoses: Impaired tissue integrity Knowledge deficit related to ulceration/compromised skin integrity Goals: Patient will have a decrease in wound volume by X% from date: (specify in notes) Date Initiated: 02/03/2022 Target Resolution Date: 03/13/2022 Goal Status: Active Patient/caregiver will verbalize understanding of skin care regimen Date Initiated: 02/03/2022 Target Resolution Date: 03/12/2022 Goal Status: Active [name], [name] ([id number]) [phone].pdf Page 5 of 11 Ulcer/skin breakdown will have a volume reduction of 30% by week 4 Date Initiated: 02/03/2022 Target Resolution Date: 02/26/2022 Goal Status: Active Interventions: Assess patient/caregiver ability to obtain necessary supplies Assess patient/caregiver ability to perform ulcer/skin care regimen upon admission and as needed Assess ulceration(s) every visit Notes: Electronic Signature(s) Signed: 02/25/2022 5:14:53 PM By: Blanche East RN Entered By: Blanche East on 02/25/2022 08:07:43 -------------------------------------------------------------------------------- Pain Assessment Details Patient Name: Date of Service: Marvin Atlas, PA [place] [name] 02/25/2022 8:00 A M Medical Record Number: [id number] Patient Account Number: [id number] Date of Birth/Sex: Treating RN: 1960-06-29 (61 y.o. [name] Primary Care Darlinda Bellows: Evonnie Dawes, Jenny Reichmann Other Clinician: Referring Moise Friday: Treating Duane Trias/Extender: Evette Georges in Treatment: 3 Active Problems Location of Pain Severity and Description of Pain Patient Has Paino Yes Site Locations Rate the pain. Current Pain Level:  7 Character of Pain Describe the Pain: Aching, Sharp Pain Management and Medication Current Pain Management: Electronic Signature(s) Signed: 02/25/2022 5:14:53 PM By: Blanche East RN Entered By: Blanche East on 02/25/2022 07:53:13 [name] ([id number]) [id number] Page 6 of 11 -------------------------------------------------------------------------------- Patient/Caregiver Education Details Patient Name: Date of Service: [name], [place] 11/15/2023andnbsp8:00 A M Medical Record Number: [id number] Patient Account Number: [id number] Date of Birth/Gender: Treating RN: Dec 29, 1960 (61 y.o. [name] Primary Care Physician: Evonnie Dawes, Jenny Reichmann Other Clinician: Referring Physician: Treating Physician/Extender: Evette Georges in Treatment: 3 Education Assessment Education Provided To: Patient Education Topics Provided Venous: Methods: Explain/Verbal Responses: Reinforcements needed, State content correctly Electronic Signature(s) Signed: 02/25/2022 5:14:53 PM By: Blanche East RN Entered By: Blanche East on 02/25/2022 08:07:58 -------------------------------------------------------------------------------- Wound Assessment Details Patient Name: Date of Service: Marvin Atlas, PA [place] [name] 02/25/2022 8:00 A M Medical Record Number: [id number] Patient Account Number: [id number] Date of Birth/Sex: Treating RN: [birth year] (61 y.o. [name] Primary Care Delaine Canter: Evonnie Dawes, Jenny Reichmann Other Clinician: Referring Chrissy Ealey: Treating Liv Rallis/Extender: Evette Georges in Treatment: 3 Wound Status Wound Number: 6 Primary Lymphedema Etiology: Wound Location: Right T - Web between 1st and 2nd oe Wound Open Wounding Event: Gradually Appeared Status: Date Acquired: 01/11/2022 Comorbid Anemia, Sleep Apnea, Congestive Heart Failure, Coronary Artery Weeks Of Treatment: 3 History: Disease, Peripheral Venous Disease,  Osteoarthritis Clustered Wound: No Photos Wound Measurements Length: (cm) 0.5 Width: (cm) 0.4 Depth: (cm) 0.1 Area: (cm) 0.157 Volume: (cm) 0.016 % Reduction in Area: 97.5% % Reduction in Volume: 98.7% Epithelialization: Medium (34-66%) Tunneling: No Undermining: No Wound Description [name], [name] ([id number]) Classification: Full Thickness With Exposed Support Structures  Wound Margin: Distinct, outline attached Exudate Amount: Medium Exudate Type: Serosanguineous Exudate Color: red, brown [id number] Page 7 of 11 Foul Odor After Cleansing: No Slough/Fibrino Yes Wound Bed Granulation Amount: Medium (34-66%) Exposed Structure Granulation Quality: Pink Fascia Exposed: No Necrotic Amount: Medium (34-66%) Fat Layer (Subcutaneous Tissue) Exposed: Yes Necrotic Quality: Adherent Slough Tendon Exposed: No Muscle Exposed: No Joint Exposed: No Bone Exposed: No Periwound Skin Texture Texture Color No Abnormalities Noted: No No Abnormalities Noted: No Callus: No Atrophie Blanche: No Crepitus: No Cyanosis: No Excoriation: No Ecchymosis: No Induration: No Erythema: No Rash: No Hemosiderin Staining: No Scarring: No Mottled: No Pallor: No Moisture Rubor: No No Abnormalities Noted: No Dry / Scaly: No Temperature / Pain Maceration: Yes Temperature: No Abnormality Tenderness on Palpation: Yes Treatment Notes Wound #6 (Toe - Web between 1st and 2nd) Wound Laterality: Right Cleanser Soap and Water Discharge Instruction: May shower and wash wound with dial antibacterial soap and water prior to dressing change. Wound Cleanser Discharge Instruction: Cleanse the wound with wound cleanser prior to applying a clean dressing using gauze sponges, not tissue or cotton balls. Peri-Wound Care Sween Lotion (Moisturizing lotion) Discharge Instruction: Apply moisturizing lotion as directed Topical Mupirocin Ointment Discharge Instruction: Apply Mupirocin  (Bactroban) as instructed Primary Dressing KerraCel Ag Gelling Fiber Dressing, 4x5 in (silver alginate) Discharge Instruction: Apply silver alginate to wound bed as instructed Secondary Dressing ABD Pad, 5x9 Discharge Instruction: Apply over primary dressing as directed. Woven Gauze Sponge, Non-Sterile 4x4 in Discharge Instruction: Apply over primary dressing as directed. Secured With Compression Wrap FourPress (4 layer compression wrap) Discharge Instruction: Apply four layer compression as directed. May also use Miliken CoFlex 2 layer compression system as alternative. Compression Stockings Add-Ons Electronic Signature(s) Signed: 02/25/2022 5:14:53 PM By: Blanche East RN Entered By: Blanche East on 02/25/2022 08:05:37 [name] ([id number]) [id number] Page 8 of 11 -------------------------------------------------------------------------------- Wound Assessment Details Patient Name: Date of Service: [name], [place] UL [name] 02/25/2022 8:00 A M Medical Record Number: [id number] Patient Account Number: [id number] Date of Birth/Sex: Treating RN: October 29, 1960 (61 y.o. [name] Primary Care Irl Bodie: Evonnie Dawes, Jenny Reichmann Other Clinician: Referring Kirah Stice: Treating Malaney Mcbean/Extender: Evette Georges in Treatment: 3 Wound Status Wound Number: 7 Primary Venous Leg Ulcer Etiology: Wound Location: Right, Lateral Ankle Wound Open Wounding Event: Gradually Appeared Status: Date Acquired: 01/11/2022 Comorbid Anemia, Sleep Apnea, Congestive Heart Failure, Coronary Artery Weeks Of Treatment: 3 History: Disease, Peripheral Venous Disease, Osteoarthritis Clustered Wound: No Photos Wound Measurements Length: (cm) 1 Width: (cm) 1 Depth: (cm) 0.1 Area: (cm) 0.785 Volume: (cm) 0.079 % Reduction in Area: 63% % Reduction in Volume: 81.4% Epithelialization: Small (1-33%) Tunneling: No Undermining: No Wound Description Classification:  Full Thickness With Exposed Suppo Wound Margin: Distinct, outline attached Exudate Amount: Medium Exudate Type: Serosanguineous Exudate Color: red, brown rt Structures Foul Odor After Cleansing: No Slough/Fibrino Yes Wound Bed Granulation Amount: Medium (34-66%) Exposed Structure Granulation Quality: Red, Pink Fascia Exposed: No Necrotic Amount: Medium (34-66%) Fat Layer (Subcutaneous Tissue) Exposed: Yes Necrotic Quality: Adherent Slough Tendon Exposed: No Muscle Exposed: No Joint Exposed: No Bone Exposed: No Periwound Skin Texture Texture Color No Abnormalities Noted: No No Abnormalities Noted: No Callus: No Atrophie Blanche: No Crepitus: No Cyanosis: No Excoriation: No Ecchymosis: No Induration: No Erythema: No Rash: No Hemosiderin Staining: No Scarring: No Mottled: No Pallor: No Moisture Rubor: No No Abnormalities Noted: No Dry / Scaly: No Temperature / Pain Maceration: No Temperature: No Abnormality Tenderness on Palpation: Yes [name], [name] (  [id number]) [id number] Page 9 of 11 Treatment Notes Wound #7 (Ankle) Wound Laterality: Right, Lateral Cleanser Soap and Water Discharge Instruction: May shower and wash wound with dial antibacterial soap and water prior to dressing change. Wound Cleanser Discharge Instruction: Cleanse the wound with wound cleanser prior to applying a clean dressing using gauze sponges, not tissue or cotton balls. Peri-Wound Care Sween Lotion (Moisturizing lotion) Discharge Instruction: Apply moisturizing lotion as directed Topical Mupirocin Ointment Discharge Instruction: Apply Mupirocin (Bactroban) as instructed Primary Dressing KerraCel Ag Gelling Fiber Dressing, 4x5 in (silver alginate) Discharge Instruction: Apply silver alginate to wound bed as instructed Secondary Dressing ABD Pad, 5x9 Discharge Instruction: Apply over primary dressing as directed. Woven Gauze Sponge, Non-Sterile 4x4 in Discharge  Instruction: Apply over primary dressing as directed. Secured With Compression Wrap FourPress (4 layer compression wrap) Discharge Instruction: Apply four layer compression as directed. May also use Miliken CoFlex 2 layer compression system as alternative. Compression Stockings Add-Ons Electronic Signature(s) Signed: 02/25/2022 5:14:53 PM By: Blanche East RN Entered By: Blanche East on 02/25/2022 08:06:03 -------------------------------------------------------------------------------- Wound Assessment Details Patient Name: Date of Service: Marvin Atlas, PA UL [name] 02/25/2022 8:00 A M Medical Record Number: [id number] Patient Account Number: [id number] Date of Birth/Sex: Treating RN: 07-22-1960 (61 y.o. [name] Primary Care Clif Serio: Evonnie Dawes, Jenny Reichmann Other Clinician: Referring Jonerik Sliker: Treating Audelia Knape/Extender: Evette Georges in Treatment: 3 Wound Status Wound Number: 8 Primary Venous Leg Ulcer Etiology: Wound Location: Right, Posterior Lower Leg Wound Open Wounding Event: Gradually Appeared Status: Date Acquired: 01/11/2021 Comorbid Anemia, Sleep Apnea, Congestive Heart Failure, Coronary Artery Weeks Of Treatment: 2 History: Disease, Peripheral Venous Disease, Osteoarthritis Clustered Wound: No Photos [name], [name] ([id number]) [phone].pdf Page 10 of 11 Wound Measurements Length: (cm) 1 Width: (cm) 0.5 Depth: (cm) 0.1 Area: (cm) 0.393 Volume: (cm) 0.039 % Reduction in Area: 87.2% % Reduction in Volume: 87.3% Epithelialization: Small (1-33%) Tunneling: No Undermining: No Wound Description Classification: Full Thickness Without Exposed Support Structures Wound Margin: Distinct, outline attached Exudate Amount: Medium Exudate Type: Serosanguineous Exudate Color: red, brown Foul Odor After Cleansing: No Slough/Fibrino Yes Wound Bed Granulation Amount: Large (67-100%) Exposed Structure Granulation Quality:  Red Fascia Exposed: No Necrotic Amount: Small (1-33%) Fat Layer (Subcutaneous Tissue) Exposed: Yes Necrotic Quality: Adherent Slough Tendon Exposed: No Muscle Exposed: No Joint Exposed: No Bone Exposed: No Periwound Skin Texture Texture Color No Abnormalities Noted: Yes No Abnormalities Noted: No Hemosiderin Staining: Yes Moisture No Abnormalities Noted: No Temperature / Pain Dry / Scaly: No Temperature: No Abnormality Maceration: No Tenderness on Palpation: Yes Treatment Notes Wound #8 (Lower Leg) Wound Laterality: Right, Posterior Cleanser Soap and Water Discharge Instruction: May shower and wash wound with dial antibacterial soap and water prior to dressing change. Wound Cleanser Discharge Instruction: Cleanse the wound with wound cleanser prior to applying a clean dressing using gauze sponges, not tissue or cotton balls. Peri-Wound Care Sween Lotion (Moisturizing lotion) Discharge Instruction: Apply moisturizing lotion as directed Topical Mupirocin Ointment Discharge Instruction: Apply Mupirocin (Bactroban) as instructed Primary Dressing KerraCel Ag Gelling Fiber Dressing, 4x5 in (silver alginate) Discharge Instruction: Apply silver alginate to wound bed as instructed Secondary Dressing ABD Pad, 5x9 Discharge Instruction: Apply over primary dressing as directed. Woven Gauze Sponge, Non-Sterile 4x4 in Discharge Instruction: Apply over primary dressing as directed. [name], [name] ([id number]) [id number] Page 11 of 11 Secured With Compression Wrap FourPress (4 layer compression wrap) Discharge Instruction: Apply four layer compression as directed. May also use Miliken CoFlex 2 layer compression system  as alternative. Compression Stockings Add-Ons Electronic Signature(s) Signed: 02/25/2022 5:14:53 PM By: Blanche East RN Entered By: Blanche East on 02/25/2022  08:06:45 -------------------------------------------------------------------------------- Vitals Details Patient Name: Date of Service: Marvin Atlas, PA UL [name] 02/25/2022 8:00 A M Medical Record Number: [id number] Patient Account Number: [id number] Date of Birth/Sex: Treating RN: December 29, 1960 (61 y.o. [name] Primary Care Iliani Vejar: Evonnie Dawes, John Other Clinician: Referring Pearson Reasons: Treating Clotiel Troop/Extender: Evette Georges in Treatment: 3 Vital Signs Time Taken: 07:52 Temperature (F): 98.0 Pulse (bpm): 69 Respiratory Rate (breaths/min): 18 Blood Pressure (mmHg): 106/68 Reference Range: 80 - 120 mg / dl Electronic Signature(s) Signed: 02/25/2022 5:14:53 PM By: Blanche East RN Entered By: Blanche East on 02/25/2022 07:52:59

## 2022-02-26 NOTE — Progress Notes (Signed)
[name] ([id number]) [id number] Page 1 of 13 Visit Report for 02/25/2022 Chief Complaint Document Details Patient Name: Date of Service: [name], [name] 02/25/2022 8:00 A M Medical Record Number: [id number] Patient Account Number: [id number] Date of Birth/Sex: Treating RN: 02-Nov-1960 (61 y.o. M) Primary Care Provider: Evonnie Dawes, Jenny Reichmann Other Clinician: Referring Provider: Treating Provider/Extender: Evette Georges in Treatment: 3 Information Obtained from: Patient Chief Complaint 05/20/2020; patient is here for review of wounds on his bilateral lower legs in the setting of severe chronic venous insufficiency with lymphedema 02/03/2022: returns to clinic today with new wounds on RLE Electronic Signature(s) Signed: 02/25/2022 8:20:10 AM By: Fredirick Maudlin MD FACS Entered By: Fredirick Maudlin on 02/25/2022 08:20:10 -------------------------------------------------------------------------------- Debridement Details Patient Name: Date of Service: Marvin Atlas, PA UL J. 02/25/2022 8:00 A M Medical Record Number: [id number] Patient Account Number: [id number] Date of Birth/Sex: Treating RN: [birth year] (61 y.o. [name] Primary Care Provider: Evonnie Dawes, Jenny Reichmann Other Clinician: Referring Provider: Treating Provider/Extender: Evette Georges in Treatment: 3 Debridement Performed for Assessment: Wound #7 Right,Lateral Ankle Performed By: Physician Fredirick Maudlin, MD Debridement Type: Debridement Severity of Tissue Pre Debridement: Fat layer exposed Level of Consciousness (Pre-procedure): Awake and Alert Pre-procedure Verification/Time Out Yes - 08:14 Taken: Start Time: 08:15 Pain Control: Lidocaine 5% topical ointment T Area Debrided (L x W): otal 1 (cm) x 1 (cm) = 1 (cm) Tissue and other material debrided: Non-Viable, Eschar, Slough, Slough Level: Non-Viable Tissue Debridement Description:  Selective/Open Wound Instrument: Curette Bleeding: Minimum Hemostasis Achieved: Pressure Response to Treatment: Procedure was tolerated well Level of Consciousness (Post- Awake and Alert procedure): Post Debridement Measurements of Total Wound Length: (cm) 1 Width: (cm) 1 Depth: (cm) 0.1 Volume: (cm) 0.079 Character of Wound/Ulcer Post Debridement: Requires Further Debridement Severity of Tissue Post Debridement: Fat layer exposed [name], [name] ([id number]) [id number] Page 2 of 13 Post Procedure Diagnosis Same as Pre-procedure Notes Scribed for Dr. Celine Ahr by Marvin East, RN Electronic Signature(s) Signed: 02/25/2022 9:48:09 AM By: Fredirick Maudlin MD FACS Signed: 02/25/2022 5:14:53 PM By: Marvin East RN Entered By: [name] on 02/25/2022 08:18:02 -------------------------------------------------------------------------------- Debridement Details Patient Name: Date of Service: Marvin Atlas, PA UL J. 02/25/2022 8:00 A M Medical Record Number: [id number] Patient Account Number: [id number] Date of Birth/Sex: Treating RN: 12-19-1960 (60 y.o. [name] Primary Care Provider: Evonnie Dawes, Jenny Reichmann Other Clinician: Referring Provider: Treating Provider/Extender: Evette Georges in Treatment: 3 Debridement Performed for Assessment: Wound #8 Right,Posterior Lower Leg Performed By: Physician Fredirick Maudlin, MD Debridement Type: Debridement Severity of Tissue Pre Debridement: Fat layer exposed Level of Consciousness (Pre-procedure): Awake and Alert Pre-procedure Verification/Time Out Yes - 08:14 Taken: Start Time: 08:15 Pain Control: Lidocaine 5% topical ointment T Area Debrided (L x W): otal 1 (cm) x 0.5 (cm) = 0.5 (cm) Tissue and other material debrided: Non-Viable, Eschar Level: Non-Viable Tissue Debridement Description: Selective/Open Wound Instrument: Curette Bleeding: Minimum Hemostasis Achieved: Pressure Response  to Treatment: Procedure was tolerated well Level of Consciousness (Post- Awake and Alert procedure): Post Debridement Measurements of Total Wound Length: (cm) 1 Width: (cm) 0.5 Depth: (cm) 0.1 Volume: (cm) 0.039 Character of Wound/Ulcer Post Debridement: Requires Further Debridement Severity of Tissue Post Debridement: Fat layer exposed Post Procedure Diagnosis Same as Pre-procedure Notes Scribed for Dr. Celine Ahr by Marvin East, RN Electronic Signature(s) Signed: 02/25/2022 9:48:09 AM By: Fredirick Maudlin MD FACS Signed: 02/25/2022 5:14:53 PM By: Marvin East RN Entered By: [name] on  02/25/2022 08:18:13 [name], [name] ([id number]) [id number] Page 3 of 13 -------------------------------------------------------------------------------- HPI Details Patient Name: Date of Service: [name], [name] 02/25/2022 8:00 A M Medical Record Number: [id number] Patient Account Number: [id number] Date of Birth/Sex: Treating RN: 1961-01-28 (61 y.o. M) Primary Care Provider: Evonnie Dawes, Jenny Reichmann Other Clinician: Referring Provider: Treating Provider/Extender: Evette Georges in Treatment: 3 History of Present Illness HPI Description: ADMISSION 05/20/2020 This is a 61 year old man who is not a diabetic however he has a long history of chronic venous insufficiency with wounds on his lower extremities. He went to the wound care center in [place] for a long period of time with recurrent wounds in these areas treated with compression wraps. He comes in today with wounds on his bilateral lower legs that he says have been there about a year on the right and about 6 months on the left. He went to see Dr. Oneida Alar on 01/17/2020 who noted that he had no history of DVT but does have a history of venous insufficiency wounds. They noted that he was considering laser ablation in October 2020 but he canceled the procedure. He had an episode of bleeding from  varicose veins in 2019. He had repeated venous reflux study on 10/6 which showed no evidence of a DVT or SVT Noted that he had venous reflux in the right greater saphenous vein in the thigh and the calf on the right. Also . noted to have venous reflux in the right femoral vein right popliteal vein and right small saphenous vein. At the time in October they wanted the wounds to heal a bit he also also had a right groin wound which is apparently closed. Not sure about the follow-up with vein and vascular. He comes in today with some light compression wraps and collagen on the wound placed by Dr. Lilia Pro who is a Education officer, environmental in [place]. He has 2 areas on the left leg 1 anteriorly and a large area posteriorly and a smaller area on the right anterior lower leg. He has marked lower extremity edema which is nonpitting. Hemosiderin deposition on both lower legs extensively. There is dilated venules in his feet. Past medical history includes history of opioid abuse in remission, left nephrolithiasis, history of an ablation although I did not see this in Dr. Oneida Alar notes, congestive heart failure and anemia ABIs in our clinic were noncompressible bilaterally 05/27/2020; we admitted this patient to clinic last week. He has bilateral superficial venous insufficiency with bilateral wounds left greater than right. We put him in 4 layer compression with silver alginate. The he was previously cared for in the [place] wound care clinic when it was open. His wounds are better with compression this week and there is certainly less swelling. 2/21; bilateral superficial venous insufficiency wounds. A cluster on the left lateral. He has very well-defined areas on the right anterior right and right lateral. We are using silver alginate and 4-layer compression 2/28; bilateral superficial venous insufficiency wounds in the setting of significant stage III lymphedema. He has a cluster on the left which is smaller. His  area on the right anterior and right lateral also look better. 3/14 superficial bilateral venous insufficiency wounds. Much better on both sides in fact the area on the right posterior is healed. He still has 2 small open areas on the right anterior and the left lateral 3/21; superficial bilateral venous insufficiency wounds. Everything is closed on the right side. Small area still remains on the left.  We will transition him today to his right extremiteaze stocking. Still dressing the remaining wound on the left with compression 3/28; the patient's right leg remains closed in his wraparound stocking. Disappointingly today the area on the left lateral leg looks about the same. I thought this might be closed with been using silver alginate 4/4; right leg remains in his wraparound stocking.. Left lateral leg measures smaller even under illumination the wound looks good. Changed him to [facility] with additional ABD compression last week 4/11; left lateral posterior very tiny superficial wound. This should be closed by next week. Using Hydrofera Blue. 4/18; left lateral wound is totally epithelialized. He has his own external compression stocking and compression pumps. READMISSION 02/03/2022 This is a 61 year old man well-known to the wound care center. He has chronic venous insufficiency and lymphedema. He has been evaluated in the past and has documented venous reflux, but thus far, has not undergone ablation. He returns to clinic today with multiple wounds on his right lower extremity. He reports that the medial lower leg wound started when something sharp stuck into his leg about a year ago. He says he was trying to treat this at home and took a course of cephalexin that he had leftover from a previous event. It did not improve and in the past 3 to 4 weeks, it has actually gotten worse, to the point that he is unable to use his lymphedema pumps secondary to pain. He has another ulcer on his left  lateral ankle and one between his first and second toes, both present for about 3 to 4 weeks, per his report. On his right medial lower leg, there are 2 ulcers with slough accumulated. The surface underneath the slough is quite fibrous, consistent with his history of multiple lower leg ulcerations. He has a triangular wound on his right lateral ankle, similar in appearance to the others. Between his first and second toes, he has what appears to be athlete's foot, with a fungal odor and skin breakdown with eschar and slough present. 02/09/2022: On the right medial lower leg wound, there is significant slough accumulation. The right lateral ankle wound is gray and the patient reports an increase in pain in this site. The area between his first and second toes has dried out considerably. The more proximal ulcer on his right posterior leg has some slough buildup over a fibrotic surface. Edema control is markedly improved. 02/17/2022: The right medial lower leg and right lateral ankle wound still have a lot of slough accumulation, but they are less painful. The culture that I took last week grew out multiple gram-positive species. The area between his first and second toes still has a consistent appearance with athlete's foot. The more proximal lateral lower leg wound has healed. He still has not been scheduled for his repeat venous reflux studies. 02/25/2022: He had his repeat reflux studies done but for some reason they only did the right leg. He does have evidence of reflux. In the past, bilateral studies were performed and he has significant venous reflux in both legs. His edema control is excellent this week. The areas between his toes are drying up. The right medial lower leg wound is substantially smaller with just a little bit of eschar overlying the open area. The lateral ankle wound still has some slough and eschar accumulation and remains fairly tender. Electronic Signature(s) Signed: 02/25/2022  8:27:49 AM By: Fredirick Maudlin MD FACS Entered By: Fredirick Maudlin on 02/25/2022 08:27:49 [name] ([id number])  [phone].pdf Page 4 of 13 -------------------------------------------------------------------------------- Physical Exam Details Patient Name: Date of Service: [name], [name] 02/25/2022 8:00 A M Medical Record Number: [id number] Patient Account Number: [id number] Date of Birth/Sex: Treating RN: May 14, 1960 (61 y.o. M) Primary Care Provider: Evonnie Dawes, Jenny Reichmann Other Clinician: Referring Provider: Treating Provider/Extender: Evette Georges in Treatment: 3 Constitutional . . . . No acute distress.Marland Kitchen Respiratory Normal work of breathing on room air.. Notes 02/25/2022: His edema control is excellent this week. The areas between his toes are drying up. The right medial lower leg wound is substantially smaller with just a little bit of eschar overlying the open area. The lateral ankle wound still has some slough and eschar accumulation and remains fairly tender. Electronic Signature(s) Signed: 02/25/2022 8:28:24 AM By: Fredirick Maudlin MD FACS Entered By: Fredirick Maudlin on 02/25/2022 08:28:24 -------------------------------------------------------------------------------- Physician Orders Details Patient Name: Date of Service: Marvin Atlas, PA UL J. 02/25/2022 8:00 A M Medical Record Number: [id number] Patient Account Number: [id number] Date of Birth/Sex: Treating RN: 07-01-1960 (61 y.o. [name] Primary Care Provider: Evonnie Dawes, Jenny Reichmann Other Clinician: Referring Provider: Treating Provider/Extender: Evette Georges in Treatment: 3 Verbal / Phone Orders: No Diagnosis Coding Follow-up Appointments ppointment in 1 week. - Dr. Celine Ahr rm 4 Return A Other: - patient to purchase over the counter zinc oxide (desitin) apply between toes. Vein and Vascular Speak with Damita Dunnings about venous studies phone  [phone]. Appt scheduled 02/19/2022 0900. Anesthetic (In clinic) Topical Lidocaine 4% applied to wound bed - all wounds prior to debridement Bathing/ Shower/ Hygiene May shower with protection but do not get wound dressing(s) wet. Edema Control - Lymphedema / SCD / Other Lymphedema Pumps. Use Lymphedema pumps on leg(s) 2-3 times a day for 45-60 minutes. If wearing any wraps or hose, do not remove them. Continue exercising as instructed. Elevate legs to the level of the heart or above for 30 minutes daily and/or when sitting, a frequency of: Avoid standing for long periods of time. Exercise regularly Compression stocking or Garment 20-30 mm/Hg pressure to: - toleftleg daily Wound Treatment Wound #6 - T - Web between 1st and 2nd oe Wound Laterality: Right Cleanser: Soap and Water 1 x Per Week/7 Days Discharge Instructions: May shower and wash wound with dial antibacterial soap and water prior to dressing change. Cleanser: Wound Cleanser 1 x Per Week/7 Days Discharge Instructions: Cleanse the wound with wound cleanser prior to applying a clean dressing using gauze sponges, not tissue or cotton balls. ADEOLUWA, SILVERS ([id number]) [id number] Page 5 of 13 Peri-Wound Care: Sween Lotion (Moisturizing lotion) 1 x Per Week/7 Days Discharge Instructions: Apply moisturizing lotion as directed Topical: Mupirocin Ointment 1 x Per Week/7 Days Discharge Instructions: Apply Mupirocin (Bactroban) as instructed Prim Dressing: KerraCel Ag Gelling Fiber Dressing, 4x5 in (silver alginate) 1 x Per Week/7 Days ary Discharge Instructions: Apply silver alginate to wound bed as instructed Secondary Dressing: ABD Pad, 5x9 1 x Per Week/7 Days Discharge Instructions: Apply over primary dressing as directed. Secondary Dressing: Woven Gauze Sponge, Non-Sterile 4x4 in 1 x Per Week/7 Days Discharge Instructions: Apply over primary dressing as directed. Compression Wrap: FourPress (4 layer  compression wrap) 1 x Per Week/7 Days Discharge Instructions: Apply four layer compression as directed. May also use Miliken CoFlex 2 layer compression system as alternative. Wound #7 - Ankle Wound Laterality: Right, Lateral Cleanser: Soap and Water 1 x Per Week/7 Days Discharge Instructions: May shower and wash wound with dial antibacterial  soap and water prior to dressing change. Cleanser: Wound Cleanser 1 x Per Week/7 Days Discharge Instructions: Cleanse the wound with wound cleanser prior to applying a clean dressing using gauze sponges, not tissue or cotton balls. Peri-Wound Care: Sween Lotion (Moisturizing lotion) 1 x Per Week/7 Days Discharge Instructions: Apply moisturizing lotion as directed Topical: Mupirocin Ointment 1 x Per Week/7 Days Discharge Instructions: Apply Mupirocin (Bactroban) as instructed Prim Dressing: KerraCel Ag Gelling Fiber Dressing, 4x5 in (silver alginate) 1 x Per Week/7 Days ary Discharge Instructions: Apply silver alginate to wound bed as instructed Secondary Dressing: ABD Pad, 5x9 1 x Per Week/7 Days Discharge Instructions: Apply over primary dressing as directed. Secondary Dressing: Woven Gauze Sponge, Non-Sterile 4x4 in 1 x Per Week/7 Days Discharge Instructions: Apply over primary dressing as directed. Compression Wrap: FourPress (4 layer compression wrap) 1 x Per Week/7 Days Discharge Instructions: Apply four layer compression as directed. May also use Miliken CoFlex 2 layer compression system as alternative. Wound #8 - Lower Leg Wound Laterality: Right, Posterior Cleanser: Soap and Water 1 x Per Week/7 Days Discharge Instructions: May shower and wash wound with dial antibacterial soap and water prior to dressing change. Cleanser: Wound Cleanser 1 x Per Week/7 Days Discharge Instructions: Cleanse the wound with wound cleanser prior to applying a clean dressing using gauze sponges, not tissue or cotton balls. Peri-Wound Care: Sween Lotion (Moisturizing  lotion) 1 x Per Week/7 Days Discharge Instructions: Apply moisturizing lotion as directed Topical: Mupirocin Ointment 1 x Per Week/7 Days Discharge Instructions: Apply Mupirocin (Bactroban) as instructed Prim Dressing: KerraCel Ag Gelling Fiber Dressing, 4x5 in (silver alginate) 1 x Per Week/7 Days ary Discharge Instructions: Apply silver alginate to wound bed as instructed Secondary Dressing: ABD Pad, 5x9 1 x Per Week/7 Days Discharge Instructions: Apply over primary dressing as directed. Secondary Dressing: Woven Gauze Sponge, Non-Sterile 4x4 in 1 x Per Week/7 Days Discharge Instructions: Apply over primary dressing as directed. Compression Wrap: FourPress (4 layer compression wrap) 1 x Per Week/7 Days Discharge Instructions: Apply four layer compression as directed. May also use Miliken CoFlex 2 layer compression system as alternative. Consults Vein and vascular - Referral for Dr. Scot Dock, Vascular Surgeon consideration of saphenous vein ablation [name], [name] ([id number]) [phone].pdf Page 6 of 13 Electronic Signature(s) Signed: 02/25/2022 9:48:09 AM By: Fredirick Maudlin MD FACS Signed: 02/25/2022 5:14:53 PM By: Marvin East RN Entered By: [name] on 02/25/2022 08:45:52 Prescription 02/25/2022 -------------------------------------------------------------------------------- Jolinda Croak MD Patient Name: Provider: Jul 23, 1960 [id number] Date of Birth: NPI#: Jerilynn [id number] [id number] Sex: DEA #: [phone] Phone #: License #: [place] Patient Address: [address] [name], [place] [address] [place], [place] [address] [phone] Allergies No Known Allergies Provider's Orders Vein and vascular - Referral for Dr. Scot Dock, Vascular Surgeon consideration of saphenous vein ablation Hand Signature: Date(s): Electronic Signature(s) Signed: 02/25/2022 9:48:09  AM By: Fredirick Maudlin MD FACS Signed: 02/25/2022 5:14:53 PM By: Marvin East RN Entered By: [name] on 02/25/2022 08:45:53 -------------------------------------------------------------------------------- Problem List Details Patient Name: Date of Service: Marvin Atlas, PA UL J. 02/25/2022 8:00 A M Medical Record Number: [id number] Patient Account Number: [id number] Date of Birth/Sex: Treating RN: [birth year] (61 y.o. M) Primary Care Provider: Evonnie Dawes, Jenny Reichmann Other Clinician: Referring Provider: Treating Provider/Extender: Evette Georges in Treatment: 3 Active Problems ICD-10 Encounter Code Description Active Date MDM Diagnosis I87.331 Chronic venous hypertension (idiopathic) with ulcer and inflammation of right  02/03/2022 No Yes lower extremity L97.812 Non-pressure chronic ulcer of other part of right lower leg with fat layer 02/03/2022 No Yes exposed L97.312 Non-pressure chronic ulcer of right ankle with fat layer exposed 02/03/2022 No Yes [name], [name] ([id number]) [phone].pdf Page 7 of 13 L97.512 Non-pressure chronic ulcer of other part of right foot with fat layer exposed 02/03/2022 No Yes Inactive Problems Resolved Problems Electronic Signature(s) Signed: 02/25/2022 8:19:59 AM By: Fredirick Maudlin MD FACS Entered By: Fredirick Maudlin on 02/25/2022 08:19:58 -------------------------------------------------------------------------------- Progress Note Details Patient Name: Date of Service: Marvin Atlas, PA UL J. 02/25/2022 8:00 A M Medical Record Number: [id number] Patient Account Number: [id number] Date of Birth/Sex: Treating RN: 03-Oct-1960 (61 y.o. M) Primary Care Provider: Evonnie Dawes, Jenny Reichmann Other Clinician: Referring Provider: Treating Provider/Extender: Evette Georges in Treatment: 3 Subjective Chief Complaint Information obtained from Patient 05/20/2020; patient is here for review of wounds on  his bilateral lower legs in the setting of severe chronic venous insufficiency with lymphedema 02/03/2022: returns to clinic today with new wounds on RLE History of Present Illness (HPI) ADMISSION 05/20/2020 This is a 61 year old man who is not a diabetic however he has a long history of chronic venous insufficiency with wounds on his lower extremities. He went to the wound care center in [place] for a long period of time with recurrent wounds in these areas treated with compression wraps. He comes in today with wounds on his bilateral lower legs that he says have been there about a year on the right and about 6 months on the left. He went to see Dr. Oneida Alar on 01/17/2020 who noted that he had no history of DVT but does have a history of venous insufficiency wounds. They noted that he was considering laser ablation in October 2020 but he canceled the procedure. He had an episode of bleeding from varicose veins in 2019. He had repeated venous reflux study on 10/6 which showed no evidence of a DVT or SVT Noted that he had venous reflux in the right greater saphenous vein in the thigh and the calf on the right. Also . noted to have venous reflux in the right femoral vein right popliteal vein and right small saphenous vein. At the time in October they wanted the wounds to heal a bit he also also had a right groin wound which is apparently closed. Not sure about the follow-up with vein and vascular. He comes in today with some light compression wraps and collagen on the wound placed by Dr. Lilia Pro who is a Education officer, environmental in [place]. He has 2 areas on the left leg 1 anteriorly and a large area posteriorly and a smaller area on the right anterior lower leg. He has marked lower extremity edema which is nonpitting. Hemosiderin deposition on both lower legs extensively. There is dilated venules in his feet. Past medical history includes history of opioid abuse in remission, left nephrolithiasis, history of an  ablation although I did not see this in Dr. Oneida Alar notes, congestive heart failure and anemia ABIs in our clinic were noncompressible bilaterally 05/27/2020; we admitted this patient to clinic last week. He has bilateral superficial venous insufficiency with bilateral wounds left greater than right. We put him in 4 layer compression with silver alginate. The he was previously cared for in the [place] wound care clinic when it was open. His wounds are better with compression this week and there is certainly less swelling. 2/21; bilateral superficial venous insufficiency wounds. A cluster on the left  lateral. He has very well-defined areas on the right anterior right and right lateral. We are using silver alginate and 4-layer compression 2/28; bilateral superficial venous insufficiency wounds in the setting of significant stage III lymphedema. He has a cluster on the left which is smaller. His area on the right anterior and right lateral also look better. 3/14 superficial bilateral venous insufficiency wounds. Much better on both sides in fact the area on the right posterior is healed. He still has 2 small open areas on the right anterior and the left lateral 3/21; superficial bilateral venous insufficiency wounds. Everything is closed on the right side. Small area still remains on the left. We will transition him today to his right extremiteaze stocking. Still dressing the remaining wound on the left with compression 3/28; the patient's right leg remains closed in his wraparound stocking. Disappointingly today the area on the left lateral leg looks about the same. I thought this might be closed with been using silver alginate 4/4; right leg remains in his wraparound stocking.. Left lateral leg measures smaller even under illumination the wound looks good. Changed him to [facility] with additional ABD compression last week 4/11; left lateral posterior very tiny superficial wound. This should be  closed by next week. Using Hydrofera Blue. 4/18; left lateral wound is totally epithelialized. He has his own external compression stocking and compression pumps. READMISSION 02/03/2022 This is a 61 year old man well-known to the wound care center. He has chronic venous insufficiency and lymphedema. He has been evaluated in the past and has documented venous reflux, but thus far, has not undergone ablation. He returns to clinic today with multiple wounds on his right lower extremity. He reports [name], [name] ([id number]) [id number] Page 8 of 13 that the medial lower leg wound started when something sharp stuck into his leg about a year ago. He says he was trying to treat this at home and took a course of cephalexin that he had leftover from a previous event. It did not improve and in the past 3 to 4 weeks, it has actually gotten worse, to the point that he is unable to use his lymphedema pumps secondary to pain. He has another ulcer on his left lateral ankle and one between his first and second toes, both present for about 3 to 4 weeks, per his report. On his right medial lower leg, there are 2 ulcers with slough accumulated. The surface underneath the slough is quite fibrous, consistent with his history of multiple lower leg ulcerations. He has a triangular wound on his right lateral ankle, similar in appearance to the others. Between his first and second toes, he has what appears to be athlete's foot, with a fungal odor and skin breakdown with eschar and slough present. 02/09/2022: On the right medial lower leg wound, there is significant slough accumulation. The right lateral ankle wound is gray and the patient reports an increase in pain in this site. The area between his first and second toes has dried out considerably. The more proximal ulcer on his right posterior leg has some slough buildup over a fibrotic surface. Edema control is markedly improved. 02/17/2022:  The right medial lower leg and right lateral ankle wound still have a lot of slough accumulation, but they are less painful. The culture that I took last week grew out multiple gram-positive species. The area between his first and second toes still has a consistent appearance with athlete's foot. The more proximal lateral lower leg wound has healed. He  still has not been scheduled for his repeat venous reflux studies. 02/25/2022: He had his repeat reflux studies done but for some reason they only did the right leg. He does have evidence of reflux. In the past, bilateral studies were performed and he has significant venous reflux in both legs. His edema control is excellent this week. The areas between his toes are drying up. The right medial lower leg wound is substantially smaller with just a little bit of eschar overlying the open area. The lateral ankle wound still has some slough and eschar accumulation and remains fairly tender. Patient History Information obtained from Patient. Family History Cancer, Diabetes - Father, Hypertension - Father, No family history of Heart Disease, Hereditary Spherocytosis, Kidney Disease, Lung Disease, Seizures, Stroke, Thyroid Problems, Tuberculosis. Social History Current every day smoker, Marital Status - Divorced, Alcohol Use - Never, Drug Use - Prior History, Caffeine Use - Rarely. Medical History Eyes Denies history of Cataracts, Glaucoma, Optic Neuritis Ear/Nose/Mouth/Throat Denies history of Chronic sinus problems/congestion, Middle ear problems Hematologic/Lymphatic Patient has history of Anemia Denies history of Hemophilia, Human Immunodeficiency Virus, Lymphedema, Sickle Cell Disease Respiratory Patient has history of Sleep Apnea Denies history of Aspiration, Asthma, Chronic Obstructive Pulmonary Disease (COPD), Pneumothorax, Tuberculosis Cardiovascular Patient has history of Congestive Heart Failure, Coronary Artery Disease, Peripheral Venous  Disease Denies history of Angina, Arrhythmia, Deep Vein Thrombosis, Hypertension, Hypotension, Myocardial Infarction, Peripheral Arterial Disease, Phlebitis, Vasculitis Gastrointestinal Denies history of Cirrhosis , Colitis, Crohnoos, Hepatitis A, Hepatitis B, Hepatitis C Endocrine Denies history of Type I Diabetes, Type II Diabetes Genitourinary Denies history of End Stage Renal Disease Immunological Denies history of Lupus Erythematosus, Raynaudoos, Scleroderma Integumentary (Skin) Denies history of History of Burn Musculoskeletal Patient has history of Osteoarthritis Denies history of Gout, Rheumatoid Arthritis, Osteomyelitis Neurologic Denies history of Dementia, Neuropathy, Quadriplegia, Paraplegia, Seizure Disorder Oncologic Denies history of Received Chemotherapy, Received Radiation Psychiatric Denies history of Anorexia/bulimia, Confinement Anxiety Hospitalization/Surgery History - right hip nailing 10/17/2019. - 05/17/2018 nephrolithotomy left. - 01/29/2017 right lymph noes biopsy. - cystoscopy 2017, 2018. - 2017 gastric bypass surgery. Medical A Surgical History Notes nd Gastrointestinal gastric bypass Objective Constitutional No acute distress.. Vitals Time Taken: 7:52 AM, Temperature: 98.0 F, Pulse: 69 bpm, Respiratory Rate: 18 breaths/min, Blood Pressure: 106/68 mmHg. [name], [name] ([id number]) [id number] Page 9 of 13 Respiratory Normal work of breathing on room air.. General Notes: 02/25/2022: His edema control is excellent this week. The areas between his toes are drying up. The right medial lower leg wound is substantially smaller with just a little bit of eschar overlying the open area. The lateral ankle wound still has some slough and eschar accumulation and remains fairly tender. Integumentary (Hair, Skin) Wound #6 status is Open. Original cause of wound was Gradually Appeared. The date acquired was: 01/11/2022. The wound has been  in treatment 3 weeks. The wound is located on the Right T - Web between 1st and 2nd. The wound measures 0.5cm length x 0.4cm width x 0.1cm depth; 0.157cm^2 area and 0.016cm^3 oe volume. There is Fat Layer (Subcutaneous Tissue) exposed. There is no tunneling or undermining noted. There is a medium amount of serosanguineous drainage noted. The wound margin is distinct with the outline attached to the wound base. There is medium (34-66%) pink granulation within the wound bed. There is a medium (34-66%) amount of necrotic tissue within the wound bed including Adherent Slough. The periwound skin appearance exhibited: Maceration. The periwound skin appearance did not exhibit: Callus, Crepitus, Excoriation, Induration, Rash, Scarring,  Dry/Scaly, Atrophie Marvin, Cyanosis, Ecchymosis, Hemosiderin Staining, Mottled, Pallor, Rubor, Erythema. Periwound temperature was noted as No Abnormality. The periwound has tenderness on palpation. Wound #7 status is Open. Original cause of wound was Gradually Appeared. The date acquired was: 01/11/2022. The wound has been in treatment 3 weeks. The wound is located on the Right,Lateral Ankle. The wound measures 1cm length x 1cm width x 0.1cm depth; 0.785cm^2 area and 0.079cm^3 volume. There is Fat Layer (Subcutaneous Tissue) exposed. There is no tunneling or undermining noted. There is a medium amount of serosanguineous drainage noted. The wound margin is distinct with the outline attached to the wound base. There is medium (34-66%) red, pink granulation within the wound bed. There is a medium (34-66%) amount of necrotic tissue within the wound bed including Adherent Slough. The periwound skin appearance did not exhibit: Callus, Crepitus, Excoriation, Induration, Rash, Scarring, Dry/Scaly, Maceration, Atrophie Marvin, Cyanosis, Ecchymosis, Hemosiderin Staining, Mottled, Pallor, Rubor, Erythema. Periwound temperature was noted as No Abnormality. The periwound has tenderness on  palpation. Wound #8 status is Open. Original cause of wound was Gradually Appeared. The date acquired was: 01/11/2021. The wound has been in treatment 2 weeks. The wound is located on the Right,Posterior Lower Leg. The wound measures 1cm length x 0.5cm width x 0.1cm depth; 0.393cm^2 area and 0.039cm^3 volume. There is Fat Layer (Subcutaneous Tissue) exposed. There is no tunneling or undermining noted. There is a medium amount of serosanguineous drainage noted. The wound margin is distinct with the outline attached to the wound base. There is large (67-100%) red granulation within the wound bed. There is a small (1-33%) amount of necrotic tissue within the wound bed including Adherent Slough. The periwound skin appearance had no abnormalities noted for texture. The periwound skin appearance exhibited: Hemosiderin Staining. The periwound skin appearance did not exhibit: Dry/Scaly, Maceration. Periwound temperature was noted as No Abnormality. The periwound has tenderness on palpation. 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 There was a Selective/Open Wound Non-Viable Tissue Debridement with a total area of 1 sq cm performed by Fredirick Maudlin, MD. With the following instrument(s): Curette to remove Non-Viable tissue/material. Material removed includes Eschar and Slough and after achieving pain control using Lidocaine 5% topical ointment. No specimens were taken. A time out was conducted at 08:14, prior to the start of the  procedure. A Minimum amount of bleeding was controlled with Pressure. The procedure was tolerated well. Post Debridement Measurements: 1cm length x 1cm width x 0.1cm depth; 0.079cm^3 volume. Character of Wound/Ulcer Post Debridement requires further debridement. Severity of Tissue Post Debridement is: Fat layer exposed. Post procedure Diagnosis Wound #7: Same as Pre-Procedure General Notes: Scribed for Dr. Celine Ahr by Marvin East, RN. Wound #8 Pre-procedure diagnosis of Wound #8 is a Venous Leg Ulcer located on the Right,Posterior Lower Leg .Severity of Tissue Pre Debridement is: Fat layer exposed. There was a Selective/Open Wound Non-Viable Tissue Debridement with a total area of 0.5 sq cm performed by Fredirick Maudlin, MD. With the following instrument(s): Curette to remove Non-Viable tissue/material. Material removed includes Eschar after achieving pain control using Lidocaine  5% topical ointment. No specimens were taken. A time out was conducted at 08:14, prior to the start of the procedure. A Minimum amount of bleeding was controlled with Pressure. The procedure was tolerated well. Post Debridement Measurements: 1cm length x 0.5cm width x 0.1cm depth; 0.039cm^3 volume. Character of Wound/Ulcer Post Debridement requires further debridement. Severity of Tissue Post Debridement is: Fat layer exposed. Post procedure Diagnosis Wound #8: Same as Pre-Procedure General Notes: Scribed for Dr. Celine Ahr by Marvin East, RN. Pre-procedure diagnosis of Wound #8 is a Venous Leg Ulcer located on the Right,Posterior Lower Leg . There was a Four Layer Compression Therapy Procedure by Marvin East, RN. Post procedure Diagnosis Wound #8: Same as Pre-Procedure Plan Follow-up Appointments: Return Appointment in 1 week. - Dr. Celine Ahr rm 4 Other: - patient to purchase over the counter zinc oxide (desitin) apply between toes. Vein and Vascular Speak with Damita Dunnings about venous studies phone #[phone], [name]  ([id number]) [id number] Page 10 of [id number]. Appt scheduled 02/19/2022 0900. Anesthetic: (In clinic) Topical Lidocaine 4% applied to wound bed - all wounds prior to debridement Bathing/ Shower/ Hygiene: May shower with protection but do not get wound dressing(s) wet. Edema Control - Lymphedema / SCD / Other: Lymphedema Pumps. Use Lymphedema pumps on leg(s) 2-3 times a day for 45-60 minutes. If wearing any wraps or hose, do not remove them. Continue exercising as instructed. Elevate legs to the level of the heart or above for 30 minutes daily and/or when sitting, a frequency of: Avoid standing for long periods of time. Exercise regularly Compression stocking or Garment 20-30 mm/Hg pressure to: - toleftleg daily Consults ordered were: Vein and vascular - Referral for Dr. Scot Dock, Vascular Surgeon consideration of saphenous vein ablation WOUND #6: - T - Web between 1st and 2nd Wound Laterality: Right oe Cleanser: Soap and Water 1 x Per Week/7 Days Discharge Instructions: May shower and wash wound with dial antibacterial soap and water prior to dressing change. Cleanser: Wound Cleanser 1 x Per Week/7 Days Discharge Instructions: Cleanse the wound with wound cleanser prior to applying a clean dressing using gauze sponges, not tissue or cotton balls. Peri-Wound Care: Sween Lotion (Moisturizing lotion) 1 x Per Week/7 Days Discharge Instructions: Apply moisturizing lotion as directed Topical: Mupirocin Ointment 1 x Per Week/7 Days Discharge Instructions: Apply Mupirocin (Bactroban) as instructed Prim Dressing: KerraCel Ag Gelling Fiber Dressing, 4x5 in (silver alginate) 1 x Per Week/7 Days ary Discharge Instructions: Apply silver alginate to wound bed as instructed Secondary Dressing: ABD Pad, 5x9 1 x Per Week/7 Days Discharge Instructions: Apply over primary dressing as directed. Secondary Dressing: Woven Gauze Sponge, Non-Sterile 4x4 in 1 x Per Week/7  Days Discharge Instructions: Apply over primary dressing as directed. Com pression Wrap: FourPress (4 layer compression wrap) 1 x Per Week/7 Days Discharge Instructions: Apply four layer compression as directed. May also use Miliken CoFlex 2 layer compression system as alternative. WOUND #7: - Ankle Wound Laterality: Right, Lateral Cleanser: Soap and Water 1 x Per Week/7 Days Discharge Instructions: May shower and wash wound with dial antibacterial soap and water prior to dressing change. Cleanser: Wound Cleanser 1 x Per Week/7 Days Discharge Instructions: Cleanse the wound with wound cleanser prior to applying a clean dressing using gauze sponges, not tissue or cotton balls. Peri-Wound Care: Sween Lotion (Moisturizing lotion) 1 x Per Week/7 Days Discharge Instructions: Apply moisturizing lotion as directed Topical: Mupirocin Ointment 1 x Per Week/7 Days Discharge Instructions: Apply Mupirocin (Bactroban) as instructed Prim Dressing: KerraCel Ag  Gelling Fiber Dressing, 4x5 in (silver alginate) 1 x Per Week/7 Days ary Discharge Instructions: Apply silver alginate to wound bed as instructed Secondary Dressing: ABD Pad, 5x9 1 x Per Week/7 Days Discharge Instructions: Apply over primary dressing as directed. Secondary Dressing: Woven Gauze Sponge, Non-Sterile 4x4 in 1 x Per Week/7 Days Discharge Instructions: Apply over primary dressing as directed. Com pression Wrap: FourPress (4 layer compression wrap) 1 x Per Week/7 Days Discharge Instructions: Apply four layer compression as directed. May also use Miliken CoFlex 2 layer compression system as alternative. WOUND #8: - Lower Leg Wound Laterality: Right, Posterior Cleanser: Soap and Water 1 x Per Week/7 Days Discharge Instructions: May shower and wash wound with dial antibacterial soap and water prior to dressing change. Cleanser: Wound Cleanser 1 x Per Week/7 Days Discharge Instructions: Cleanse the wound with wound cleanser prior to applying  a clean dressing using gauze sponges, not tissue or cotton balls. Peri-Wound Care: Sween Lotion (Moisturizing lotion) 1 x Per Week/7 Days Discharge Instructions: Apply moisturizing lotion as directed Topical: Mupirocin Ointment 1 x Per Week/7 Days Discharge Instructions: Apply Mupirocin (Bactroban) as instructed Prim Dressing: KerraCel Ag Gelling Fiber Dressing, 4x5 in (silver alginate) 1 x Per Week/7 Days ary Discharge Instructions: Apply silver alginate to wound bed as instructed Secondary Dressing: ABD Pad, 5x9 1 x Per Week/7 Days Discharge Instructions: Apply over primary dressing as directed. Secondary Dressing: Woven Gauze Sponge, Non-Sterile 4x4 in 1 x Per Week/7 Days Discharge Instructions: Apply over primary dressing as directed. Com pression Wrap: FourPress (4 layer compression wrap) 1 x Per Week/7 Days Discharge Instructions: Apply four layer compression as directed. May also use Miliken CoFlex 2 layer compression system as alternative. 02/25/2022: His edema control is excellent this week. The areas between his toes are drying up. The right medial lower leg wound is substantially smaller with just a little bit of eschar overlying the open area. The lateral ankle wound still has some slough and eschar accumulation and remains fairly tender. I used a curette to debride eschar from the right medial lower leg wound and eschar and slough from the lateral ankle wound. We will continue mupirocin, silver alginate and 4-layer compression. We will place a referral to Dr. Scot Dock to discuss saphenous vein ablation. Follow-up in 1 week. Electronic Signature(s) Signed: 02/25/2022 5:14:53 PM By: Marvin East RN Signed: 02/25/2022 5:18:05 PM By: Fredirick Maudlin MD FACS Previous Signature: 02/25/2022 8:29:49 AM Version By: Fredirick Maudlin MD FACS Entered By: [name] on 02/25/2022 16:30:40 [name] ([id number]) [id number] Page 11 of  13 -------------------------------------------------------------------------------- HxROS Details Patient Name: Date of Service: [name], [name] 02/25/2022 8:00 A M Medical Record Number: [id number] Patient Account Number: [id number] Date of Birth/Sex: Treating RN: 04/14/1960 (61 y.o. M) Primary Care Provider: Evonnie Dawes, Jenny Reichmann Other Clinician: Referring Provider: Treating Provider/Extender: Evette Georges in Treatment: 3 Information Obtained From Patient Eyes Medical History: Negative for: Cataracts; Glaucoma; Optic Neuritis Ear/Nose/Mouth/Throat Medical History: Negative for: Chronic sinus problems/congestion; Middle ear problems Hematologic/Lymphatic Medical History: Positive for: Anemia Negative for: Hemophilia; Human Immunodeficiency Virus; Lymphedema; Sickle Cell Disease Respiratory Medical History: Positive for: Sleep Apnea Negative for: Aspiration; Asthma; Chronic Obstructive Pulmonary Disease (COPD); Pneumothorax; Tuberculosis Cardiovascular Medical History: Positive for: Congestive Heart Failure; Coronary Artery Disease; Peripheral Venous Disease Negative for: Angina; Arrhythmia; Deep Vein Thrombosis; Hypertension; Hypotension; Myocardial Infarction; Peripheral Arterial Disease; Phlebitis; Vasculitis Gastrointestinal Medical History: Negative for: Cirrhosis ; Colitis; Crohns; Hepatitis A; Hepatitis B; Hepatitis C Past Medical History Notes:  gastric bypass Endocrine Medical History: Negative for: Type I Diabetes; Type II Diabetes Genitourinary Medical History: Negative for: End Stage Renal Disease Immunological Medical History: Negative for: Lupus Erythematosus; Raynauds; Scleroderma Integumentary (Skin) Medical History: Negative for: History of Burn Musculoskeletal Medical History: Positive for: Osteoarthritis Negative for: Gout; Rheumatoid Arthritis; Osteomyelitis OSIAH, HARING ([id number]) [id number]  Page 12 of 13 Neurologic Medical History: Negative for: Dementia; Neuropathy; Quadriplegia; Paraplegia; Seizure Disorder Oncologic Medical History: Negative for: Received Chemotherapy; Received Radiation Psychiatric Medical History: Negative for: Anorexia/bulimia; Confinement Anxiety Immunizations Pneumococcal Vaccine: Received Pneumococcal Vaccination: Yes Received Pneumococcal Vaccination On or After 60th Birthday: Yes Implantable Devices None Hospitalization / Surgery History Type of Hospitalization/Surgery right hip nailing 10/17/2019 05/17/2018 nephrolithotomy left 01/29/2017 right lymph noes biopsy cystoscopy 2017, 2018 2017 gastric bypass surgery Family and Social History Cancer: Yes; Diabetes: Yes - Father; Heart Disease: No; Hereditary Spherocytosis: No; Hypertension: Yes - Father; Kidney Disease: No; Lung Disease: No; Seizures: No; Stroke: No; Thyroid Problems: No; Tuberculosis: No; Current every day smoker; Marital Status - Divorced; Alcohol Use: Never; Drug Use: Prior History; Caffeine Use: Rarely; Financial Concerns: No; Food, Clothing or Shelter Needs: No; Support System Lacking: No; Transportation Concerns: No Electronic Signature(s) Signed: 02/25/2022 9:48:09 AM By: Fredirick Maudlin MD FACS Entered By: Fredirick Maudlin on 02/25/2022 08:27:55 -------------------------------------------------------------------------------- SuperBill Details Patient Name: Date of Service: Marvin Atlas, [place] UL J. 02/25/2022 Medical Record Number: [id number] Patient Account Number: [id number] Date of Birth/Sex: Treating RN: 1960/07/22 (61 y.o. M) Primary Care Provider: Evonnie Dawes, Jenny Reichmann Other Clinician: Referring Provider: Treating Provider/Extender: Evette Georges in Treatment: 3 Diagnosis Coding ICD-10 Codes Code Description [phone] Chronic venous hypertension (idiopathic) with ulcer and inflammation of right lower extremity L97.812 Non-pressure chronic ulcer of  other part of right lower leg with fat layer exposed 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 Facility Procedures : [place], [place] Code: [id number] UL J ([id number]) Description: 97597 - DEBRIDE WOUND 1ST 20 SQ CM OR < ICD-10 Diagnosis Description [phone] L97.812 Non-pressure chronic ulcer of other part of right lower leg with fat layer expos L97.312 Non-pressure chronic ulcer of right ankle with fat layer  exposed Modifier: [id number] Quantity: 1 27.pdf Page 13 of 13 Physician Procedures : CPT4 Code Description Modifier [id number] - WC PHYS LEVEL 4 - EST PT 25 ICD-10 Diagnosis Description K16.010 Non-pressure chronic ulcer of other part of right lower leg with fat layer exposed L97.312 Non-pressure chronic ulcer of right ankle with  fat layer exposed L97.512 Non-pressure chronic ulcer of other part of right foot with fat layer exposed I87.331 Chronic venous hypertension (idiopathic) with ulcer and inflammation of right lower extremity Quantity: 1 : [id number] - WC PHYS DEBR WO ANESTH 20 SQ CM ICD-10 Diagnosis Description L97.812 Non-pressure chronic ulcer of other part of right lower leg with fat layer exposed L97.312 Non-pressure chronic ulcer of right ankle with fat layer exposed Quantity: 1 Electronic Signature(s) Signed: 02/25/2022 8:30:13 AM By: Fredirick Maudlin MD FACS Entered By: Fredirick Maudlin on 02/25/2022 08:30:12

## 2022-02-27 ENCOUNTER — Telehealth: Payer: Self-pay | Admitting: Cardiology

## 2022-02-27 DIAGNOSIS — R0609 Other forms of dyspnea: Secondary | ICD-10-CM

## 2022-02-27 NOTE — Telephone Encounter (Signed)
Heidi, from [facility] called wanting the note from Dr. Heath Gold clearing the patient for his surgery sent over to them.

## 2022-03-02 NOTE — Telephone Encounter (Signed)
Preoperative team, please contact requesting office and let them know that patient has not yet completed his echocardiogram.  We would not be able to provide recommendations from cardiac standpoint sleep test has been completed.  Thank you.  Jossie Ng. Melinna Linarez NP-C     03/02/2022, 7:34 AM [place] [address] Office [phone] Fax [phone]

## 2022-03-03 NOTE — Telephone Encounter (Signed)
Will confirm with Dr. Agustin Cree, if he is needing the pt to have an echo still as well as the CT angio. 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  That looks good.  No need to change any medications       PLAN:     In order of problems listed above:   Cardiovascular preop evaluation for this gentleman with suspicion of past medical history.  I think we need to reassess his left ventricle ejection fraction.  I strongly suggested to have an echocardiogram done.  We also need to look at his coronary arteries to see if he has any significant coronary artery disease.  He tells me that he does have any symptoms suggesting reactivation of the problem but at the same time his ability to exercise is very limited because of his knee problem.  Therefore, I will ask him to have coronary CT angio.

## 2022-03-04 ENCOUNTER — Encounter (HOSPITAL_BASED_OUTPATIENT_CLINIC_OR_DEPARTMENT_OTHER): Payer: PPO | Admitting: General Surgery

## 2022-03-04 DIAGNOSIS — I87331 Chronic venous hypertension (idiopathic) with ulcer and inflammation of right lower extremity: Secondary | ICD-10-CM | POA: Diagnosis not present

## 2022-03-05 NOTE — Progress Notes (Signed)
CLARKE, PERETZ ([id number]) [id number] Page 1 of 9 Visit Report for 03/04/2022 Arrival Information Details Patient Name: Date of Service: [name], [place] J. 03/04/2022 2:00 PM Medical Record Number: [id number] Patient Account Number: [id number] Date of Birth/Sex: Treating RN: May 31, 1960 (61 y.o. Collene Gobble Primary Care Jericka Kadar: Evonnie Dawes, John Other Clinician: Referring Kemper Hochman: Treating Kariya Lavergne/Extender: Evette Georges in Treatment: 4 Visit Information History Since Last Visit Added or deleted any medications: No Patient Arrived: [name] Any new allergies or adverse reactions: No Arrival Time: 14:25 Had a fall or experienced change in No Accompanied By: father activities of daily living that may affect Transfer Assistance: None risk of falls: Patient Identification Verified: Yes Signs or symptoms of abuse/neglect since last visito No Patient Requires Transmission-Based Precautions: No Hospitalized since last visit: No Patient Has Alerts: No Implantable device outside of the clinic excluding No cellular tissue based products placed in the center since last visit: Has Dressing in Place as Prescribed: Yes Pain Present Now: No Electronic Signature(s) Signed: 03/04/2022 5:18:25 PM By: Dellie Catholic RN Entered By: Dellie Catholic on 03/04/2022 14:40:15 -------------------------------------------------------------------------------- Clinic Level of Care Assessment Details Patient Name: Date of Service: [name], [name] 03/04/2022 2:00 PM Medical Record Number: [id number] Patient Account Number: [id number] Date of Birth/Sex: Treating RN: 03-31-1961 (61 y.o. Collene Gobble Primary Care Kewan Mcnease: Evonnie Dawes, Jenny Reichmann Other Clinician: Referring Azyria Osmon: Treating Geraldine Sandberg/Extender: Evette Georges in Treatment: 4 Clinic Level of Care Assessment Items TOOL 4 Quantity Score '[]'$  - 0 Use when only an  EandM is performed on FOLLOW-UP visit ASSESSMENTS - Nursing Assessment / Reassessment '[]'$  - 0 Reassessment of Co-morbidities (includes updates in patient status) '[]'$  - 0 Reassessment of Adherence to Treatment Plan ASSESSMENTS - Wound and Skin A ssessment / Reassessment '[]'$  - 0 Simple Wound Assessment / Reassessment - one wound '[]'$  - 0 Complex Wound Assessment / Reassessment - multiple wounds '[]'$  - 0 Dermatologic / Skin Assessment (not related to wound area) ASSESSMENTS - Focused Assessment '[]'$  - 0 Circumferential Edema Measurements - multi extremities '[]'$  - 0 Nutritional Assessment / Counseling / Intervention KRISH, BAILLY ([id number]) [id number] Page 2 of 9 '[]'$  - 0 Lower Extremity Assessment (monofilament, tuning fork, pulses) '[]'$  - 0 Peripheral Arterial Disease Assessment (using hand held doppler) ASSESSMENTS - Ostomy and/or Continence Assessment and Care '[]'$  - 0 Incontinence Assessment and Management '[]'$  - 0 Ostomy Care Assessment and Management (repouching, etc.) PROCESS - Coordination of Care '[]'$  - 0 Simple Patient / Family Education for ongoing care '[]'$  - 0 Complex (extensive) Patient / Family Education for ongoing care '[]'$  - 0 Staff obtains Programmer, systems, Records, T Results / Process Orders est '[]'$  - 0 Staff telephones HHA, Nursing Homes / Clarify orders / etc '[]'$  - 0 Routine Transfer to another Facility (non-emergent condition) '[]'$  - 0 Routine Hospital Admission (non-emergent condition) '[]'$  - 0 New Admissions / Biomedical engineer / Ordering NPWT Apligraf, etc. , '[]'$  - 0 Emergency Hospital Admission (emergent condition) '[]'$  - 0 Simple Discharge Coordination '[]'$  - 0 Complex (extensive) Discharge Coordination PROCESS - Special Needs '[]'$  - 0 Pediatric / Minor Patient Management '[]'$  - 0 Isolation Patient Management '[]'$  - 0 Hearing / Language / Visual special needs '[]'$  - 0 Assessment of Community assistance (transportation, D/C planning, etc.) '[]'$  -  0 Additional assistance / Altered mentation '[]'$  - 0 Support Surface(s) Assessment (bed, cushion, seat, etc.) INTERVENTIONS - Wound Cleansing / Measurement '[]'$  - 0 Simple Wound Cleansing -  one wound '[]'$  - 0 Complex Wound Cleansing - multiple wounds '[]'$  - 0 Wound Imaging (photographs - any number of wounds) '[]'$  - 0 Wound Tracing (instead of photographs) '[]'$  - 0 Simple Wound Measurement - one wound '[]'$  - 0 Complex Wound Measurement - multiple wounds INTERVENTIONS - Wound Dressings '[]'$  - 0 Small Wound Dressing one or multiple wounds '[]'$  - 0 Medium Wound Dressing one or multiple wounds '[]'$  - 0 Large Wound Dressing one or multiple wounds '[]'$  - 0 Application of Medications - topical '[]'$  - 0 Application of Medications - injection INTERVENTIONS - Miscellaneous '[]'$  - 0 External ear exam '[]'$  - 0 Specimen Collection (cultures, biopsies, blood, body fluids, etc.) '[]'$  - 0 Specimen(s) / Culture(s) sent or taken to Lab for analysis '[]'$  - 0 Patient Transfer (multiple staff / Civil Service fast streamer / Similar devices) '[]'$  - 0 Simple Staple / Suture removal (25 or less) '[]'$  - 0 Complex Staple / Suture removal (26 or more) '[]'$  - 0 Hypo / Hyperglycemic Management (close monitor of Blood Glucose) MOHANAD, CARSTEN ([id number]) [id number] Page 3 of 9 '[]'$  - 0 Ankle / Brachial Index (ABI) - do not check if billed separately '[]'$  - 0 Vital Signs Has the patient been seen at the hospital within the last three years: Yes Total Score: 0 Level Of Care: ____ Electronic Signature(s) Signed: 03/04/2022 5:18:25 PM By: Dellie Catholic RN Entered By: Dellie Catholic on 03/04/2022 17:16:54 -------------------------------------------------------------------------------- Compression Therapy Details Patient Name: Date of Service: [name], [place] J. 03/04/2022 2:00 PM Medical Record Number: [id number] Patient Account Number: [id number] Date of Birth/Sex: Treating RN: [birth year] (61 y.o. Collene Gobble Primary  Care Akanksha Bellmore: Evonnie Dawes, Jenny Reichmann Other Clinician: Referring Jahsiah Carpenter: Treating Marquell Saenz/Extender: Evette Georges in Treatment: 4 Compression Therapy Performed for Wound Assessment: Wound #7 Right,Lateral Ankle Performed By: Clinician Dellie Catholic, RN Compression Type: Three Layer Electronic Signature(s) Signed: 03/04/2022 5:18:25 PM By: Dellie Catholic RN Entered By: Dellie Catholic on 03/04/2022 14:42:46 -------------------------------------------------------------------------------- Compression Therapy Details Patient Name: Date of Service: [name], [place]. 03/04/2022 2:00 PM Medical Record Number: [id number] Patient Account Number: [id number] Date of Birth/Sex: Treating RN: 1960-09-12 (61 y.o. Collene Gobble Primary Care Marcy Bogosian: Evonnie Dawes, Jenny Reichmann Other Clinician: Referring Dessirae Scarola: Treating Kawana Hegel/Extender: Evette Georges in Treatment: 4 Compression Therapy Performed for Wound Assessment: Wound #8 Right,Posterior Lower Leg Performed By: Clinician Dellie Catholic, RN Compression Type: Three Layer Electronic Signature(s) Signed: 03/04/2022 5:18:25 PM By: Dellie Catholic RN Entered By: Dellie Catholic on 03/04/2022 14:42:46 -------------------------------------------------------------------------------- Encounter Discharge Information Details Patient Name: Date of Service: [name], [place]. 03/04/2022 2:00 PM Medical Record Number: [id number] Patient Account Number: [id number] Date of Birth/Sex: Treating RN: 03/05/1961 (61 y.o. Collene Gobble Primary Care Jedrek Dinovo: Evonnie Dawes, Jenny Reichmann Other Clinician: Referring Deaire Mcwhirter: Treating Donnalynn Wheeless/Extender: Gryffin, Alt[place] ([id number]) [id number] Page 4 of 9 Weeks in Treatment: 4 Encounter Discharge Information Items Discharge Condition: Stable Ambulatory Status: Walker Discharge Destination: Home Transportation:  Private Auto Accompanied By: self Schedule Follow-up Appointment: Yes Clinical Summary of Care: Patient Declined Electronic Signature(s) Signed: 03/04/2022 5:18:25 PM By: Dellie Catholic RN Entered By: Dellie Catholic on 03/04/2022 16:57:01 -------------------------------------------------------------------------------- Patient/Caregiver Education Details Patient Name: Date of Service: [name], [place] J. 11/22/2023andnbsp2:00 PM Medical Record Number: [id number] Patient Account Number: [id number] Date of Birth/Gender: Treating RN: 1960/07/17 (61 y.o. Collene Gobble Primary Care Physician: Evonnie Dawes, Jenny Reichmann Other Clinician: Referring Physician: Treating Physician/Extender: Justice Britain,  Moses Manners in Treatment: 4 Education Assessment Education Provided To: Patient Education Topics Provided Wound/Skin Impairment: Methods: Explain/Verbal Responses: Return demonstration correctly Electronic Signature(s) Signed: 03/04/2022 5:18:25 PM By: Dellie Catholic RN Entered By: Dellie Catholic on 03/04/2022 16:56:35 -------------------------------------------------------------------------------- Wound Assessment Details Patient Name: Date of Service: [name], [place] J. 03/04/2022 2:00 PM Medical Record Number: [id number] Patient Account Number: [id number] Date of Birth/Sex: Treating RN: [birth year] (62 y.o. Collene Gobble Primary Care Ron Junco: Evonnie Dawes, Jenny Reichmann Other Clinician: Referring Alisea Matte: Treating Myrtie Leuthold/Extender: Evette Georges in Treatment: 4 Wound Status Wound Number: 6 Primary Lymphedema Etiology: Wound Location: Right T - Web between 1st and 2nd oe Wound Open Wounding Event: Gradually Appeared Status: Date Acquired: 01/11/2022 Comorbid Anemia, Sleep Apnea, Congestive Heart Failure, Coronary Artery Weeks Of Treatment: 4 History: Disease, Peripheral Venous Disease, Osteoarthritis Clustered Wound: No WAH, SABIC ([id number])  [id number] Page 5 of 9 Wound Measurements Length: (cm) 0.5 Width: (cm) 0.4 Depth: (cm) 0.1 Area: (cm) 0.157 Volume: (cm) 0.016 % Reduction in Area: 97.5% % Reduction in Volume: 98.7% Epithelialization: Medium (34-66%) Tunneling: No Undermining: No Wound Description Classification: Full Thickness With Exposed Suppo Wound Margin: Distinct, outline attached Exudate Amount: Medium Exudate Type: Serosanguineous Exudate Color: red, brown rt Structures Foul Odor After Cleansing: No Slough/Fibrino Yes Wound Bed Granulation Amount: Medium (34-66%) Exposed Structure Granulation Quality: Pink Fascia Exposed: No Necrotic Amount: Medium (34-66%) Fat Layer (Subcutaneous Tissue) Exposed: Yes Necrotic Quality: Adherent Slough Tendon Exposed: No Muscle Exposed: No Joint Exposed: No Bone Exposed: No Periwound Skin Texture Texture Color No Abnormalities Noted: No No Abnormalities Noted: No Callus: No Atrophie Blanche: No Crepitus: No Cyanosis: No Excoriation: No Ecchymosis: No Induration: No Erythema: No Rash: No Hemosiderin Staining: No Scarring: No Mottled: No Pallor: No Moisture Rubor: No No Abnormalities Noted: No Dry / Scaly: No Temperature / Pain Maceration: Yes Temperature: No Abnormality Tenderness on Palpation: Yes Treatment Notes Wound #6 (Toe - Web between 1st and 2nd) Wound Laterality: Right Cleanser Soap and Water Discharge Instruction: May shower and wash wound with dial antibacterial soap and water prior to dressing change. Wound Cleanser Discharge Instruction: Cleanse the wound with wound cleanser prior to applying a clean dressing using gauze sponges, not tissue or cotton balls. Peri-Wound Care Sween Lotion (Moisturizing lotion) Discharge Instruction: Apply moisturizing lotion as directed Topical Mupirocin Ointment Discharge Instruction: Apply Mupirocin (Bactroban) as instructed Primary Dressing KerraCel Ag Gelling Fiber  Dressing, 4x5 in (silver alginate) Discharge Instruction: Apply silver alginate to wound bed as instructed Secondary Dressing ABD Pad, 5x9 Discharge Instruction: Apply over primary dressing as directed. Woven Gauze Sponge, Non-Sterile 4x4 in Discharge Instruction: Apply over primary dressing as directed. Secured With Compression Wrap FourPress (4 layer compression wrap) Discharge Instruction: Apply four layer compression as directed. May also use Miliken CoFlex 2 layer compression system as alternative. ANIBAL, QUINBY ([id number]) [id number] Page 6 of 9 Compression Stockings Add-Ons Electronic Signature(s) Signed: 03/04/2022 5:18:25 PM By: Dellie Catholic RN Entered By: Dellie Catholic on 03/04/2022 14:41:44 -------------------------------------------------------------------------------- Wound Assessment Details Patient Name: Date of Service: [name], [place] J. 03/04/2022 2:00 PM Medical Record Number: [id number] Patient Account Number: [id number] Date of Birth/Sex: Treating RN: [birth year] (61 y.o. Collene Gobble Primary Care Tinna Kolker: Evonnie Dawes, Jenny Reichmann Other Clinician: Referring Dameion Briles: Treating Clessie Karras/Extender: Evette Georges in Treatment: 4 Wound Status Wound Number: 7 Primary Venous Leg Ulcer Etiology: Wound Location: Right, Lateral Ankle Wound Open Wounding Event: Gradually Appeared Status: Date Acquired: 01/11/2022 Comorbid Anemia, Sleep  Apnea, Congestive Heart Failure, Coronary Artery Weeks Of Treatment: 4 History: Disease, Peripheral Venous Disease, Osteoarthritis Clustered Wound: No Wound Measurements Length: (cm) 1 Width: (cm) 1 Depth: (cm) 0.1 Area: (cm) 0.785 Volume: (cm) 0.079 % Reduction in Area: 63% % Reduction in Volume: 81.4% Epithelialization: Small (1-33%) Tunneling: No Undermining: No Wound Description Classification: Full Thickness With Exposed Suppo Wound Margin: Distinct, outline  attached Exudate Amount: Medium Exudate Type: Serosanguineous Exudate Color: red, brown rt Structures Foul Odor After Cleansing: No Slough/Fibrino Yes Wound Bed Granulation Amount: Medium (34-66%) Exposed Structure Granulation Quality: Red, Pink Fascia Exposed: No Necrotic Amount: Medium (34-66%) Fat Layer (Subcutaneous Tissue) Exposed: Yes Necrotic Quality: Adherent Slough Tendon Exposed: No Muscle Exposed: No Joint Exposed: No Bone Exposed: No Periwound Skin Texture Texture Color No Abnormalities Noted: No No Abnormalities Noted: No Callus: No Atrophie Blanche: No Crepitus: No Cyanosis: No Excoriation: No Ecchymosis: No Induration: No Erythema: No Rash: No Hemosiderin Staining: No Scarring: No Mottled: No Pallor: No Moisture Rubor: No No Abnormalities Noted: No Dry / Scaly: No Temperature / Pain Maceration: No Temperature: No Abnormality Tenderness on Palpation: Yes Treatment Notes Wound #7 (Ankle) Wound Laterality: Right, Lateral DAYVEON, HALLEY ([id number]) [phone].pdf Page 7 of 9 Cleanser Soap and Water Discharge Instruction: May shower and wash wound with dial antibacterial soap and water prior to dressing change. Wound Cleanser Discharge Instruction: Cleanse the wound with wound cleanser prior to applying a clean dressing using gauze sponges, not tissue or cotton balls. Peri-Wound Care Sween Lotion (Moisturizing lotion) Discharge Instruction: Apply moisturizing lotion as directed Topical Mupirocin Ointment Discharge Instruction: Apply Mupirocin (Bactroban) as instructed Primary Dressing KerraCel Ag Gelling Fiber Dressing, 4x5 in (silver alginate) Discharge Instruction: Apply silver alginate to wound bed as instructed Secondary Dressing ABD Pad, 5x9 Discharge Instruction: Apply over primary dressing as directed. Woven Gauze Sponge, Non-Sterile 4x4 in Discharge Instruction: Apply over primary dressing as directed. Secured  With Compression Wrap FourPress (4 layer compression wrap) Discharge Instruction: Apply four layer compression as directed. May also use Miliken CoFlex 2 layer compression system as alternative. Compression Stockings Add-Ons Electronic Signature(s) Signed: 03/04/2022 5:18:25 PM By: Dellie Catholic RN Entered By: Dellie Catholic on 03/04/2022 14:42:00 -------------------------------------------------------------------------------- Wound Assessment Details Patient Name: Date of Service: [name], [place] J. 03/04/2022 2:00 PM Medical Record Number: [id number] Patient Account Number: [id number] Date of Birth/Sex: Treating RN: Dec 05, 1960 (61 y.o. Collene Gobble Primary Care Flonnie Wierman: Evonnie Dawes, John Other Clinician: Referring Xanthe Couillard: Treating Dacy Enrico/Extender: Evette Georges in Treatment: 4 Wound Status Wound Number: 8 Primary Venous Leg Ulcer Etiology: Wound Location: Right, Posterior Lower Leg Wound Open Wounding Event: Gradually Appeared Status: Date Acquired: 01/11/2021 Comorbid Anemia, Sleep Apnea, Congestive Heart Failure, Coronary Artery Weeks Of Treatment: 3 History: Disease, Peripheral Venous Disease, Osteoarthritis Clustered Wound: No Wound Measurements Length: (cm) 1 Width: (cm) 0.5 Depth: (cm) 0.1 Area: (cm) 0.393 Volume: (cm) 0.039 % Reduction in Area: 87.2% % Reduction in Volume: 87.3% Epithelialization: Small (1-33%) Tunneling: No Undermining: No Wound Description Classification: Full Thickness Without Exposed Support Structures ANES, RIGEL ([id number]) Wound Margin: Distinct, outline attached Exudate Amount: Medium Exudate Type: Serosanguineous Exudate Color: red, brown Foul Odor After Cleansing: No [id number] Page 8 of 9 Slough/Fibrino Yes Wound Bed Granulation Amount: Large (67-100%) Exposed Structure Granulation Quality: Red Fascia Exposed: No Necrotic Amount: Small (1-33%) Fat Layer  (Subcutaneous Tissue) Exposed: Yes Necrotic Quality: Adherent Slough Tendon Exposed: No Muscle Exposed: No Joint Exposed: No Bone Exposed: No Periwound Skin Texture Texture Color No  Abnormalities Noted: Yes No Abnormalities Noted: No Hemosiderin Staining: Yes Moisture No Abnormalities Noted: No Temperature / Pain Dry / Scaly: No Temperature: No Abnormality Maceration: No Tenderness on Palpation: Yes Treatment Notes Wound #8 (Lower Leg) Wound Laterality: Right, Posterior Cleanser Soap and Water Discharge Instruction: May shower and wash wound with dial antibacterial soap and water prior to dressing change. Wound Cleanser Discharge Instruction: Cleanse the wound with wound cleanser prior to applying a clean dressing using gauze sponges, not tissue or cotton balls. Peri-Wound Care Sween Lotion (Moisturizing lotion) Discharge Instruction: Apply moisturizing lotion as directed Topical Mupirocin Ointment Discharge Instruction: Apply Mupirocin (Bactroban) as instructed Primary Dressing KerraCel Ag Gelling Fiber Dressing, 4x5 in (silver alginate) Discharge Instruction: Apply silver alginate to wound bed as instructed Secondary Dressing ABD Pad, 5x9 Discharge Instruction: Apply over primary dressing as directed. Woven Gauze Sponge, Non-Sterile 4x4 in Discharge Instruction: Apply over primary dressing as directed. Secured With Compression Wrap FourPress (4 layer compression wrap) Discharge Instruction: Apply four layer compression as directed. May also use Miliken CoFlex 2 layer compression system as alternative. Compression Stockings Add-Ons Electronic Signature(s) Signed: 03/04/2022 5:18:25 PM By: Dellie Catholic RN Entered By: Dellie Catholic on 03/04/2022 14:42:19 Adah Salvage ([id number]) [id number] Page 9 of 9 -------------------------------------------------------------------------------- Vitals Details Patient Name: Date of Service: [name],  [name] 03/04/2022 2:00 PM Medical Record Number: [id number] Patient Account Number: [id number] Date of Birth/Sex: Treating RN: 1960-05-20 (61 y.o. Collene Gobble Primary Care Tolbert Matheson: Evonnie Dawes, Jenny Reichmann Other Clinician: Referring Tritia Endo: Treating Irys Nigh/Extender: Evette Georges in Treatment: 4 Vital Signs Time Taken: 14:32 Temperature (F): 98.1 Height (in): 71 Pulse (bpm): 64 Weight (lbs): 230 Respiratory Rate (breaths/min): 18 Body Mass Index (BMI): 32.1 Blood Pressure (mmHg): 133/73 Reference Range: 80 - 120 mg / dl Electronic Signature(s) Signed: 03/04/2022 5:18:25 PM By: Dellie Catholic RN Entered By: Dellie Catholic on 03/04/2022 14:41:23

## 2022-03-09 DIAGNOSIS — F1111 Opioid abuse, in remission: Secondary | ICD-10-CM | POA: Diagnosis not present

## 2022-03-09 NOTE — Progress Notes (Signed)
BRADLEE, HEITMAN ([id number]) [id number] Page 1 of 1 Visit Report for 03/04/2022 SuperBill Details Patient Name: Date of Service: [name], [place] 03/04/2022 Medical Record Number: [id number] Patient Account Number: [id number] Date of Birth/Sex: Treating RN: 07/09/1960 (61 y.o. 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

## 2022-03-09 NOTE — Telephone Encounter (Signed)
Will send message to Dr. Agustin Cree nurse to see if she may go ahead and get echo scheduled per Dr. Agustin Cree.

## 2022-03-10 NOTE — Telephone Encounter (Signed)
I left a message for [name] with [facility] in regard to her call about a clearance. The only clearance form I see is from Dr. [name] with orthopedics. In any regard the pt is needing to have an echo before he can be cleared. I did ask for [name] to call back and clarify if she is indeed checking on the clearance for Dr. Ronnie Derby.

## 2022-03-10 NOTE — Telephone Encounter (Signed)
Echo scheduled for Pre-op clearance per Dr. Agustin Cree.

## 2022-03-11 ENCOUNTER — Encounter (HOSPITAL_BASED_OUTPATIENT_CLINIC_OR_DEPARTMENT_OTHER): Payer: PPO | Admitting: General Surgery

## 2022-03-11 DIAGNOSIS — I872 Venous insufficiency (chronic) (peripheral): Secondary | ICD-10-CM | POA: Diagnosis not present

## 2022-03-11 DIAGNOSIS — I87331 Chronic venous hypertension (idiopathic) with ulcer and inflammation of right lower extremity: Secondary | ICD-10-CM | POA: Diagnosis not present

## 2022-03-11 DIAGNOSIS — L97312 Non-pressure chronic ulcer of right ankle with fat layer exposed: Secondary | ICD-10-CM | POA: Diagnosis not present

## 2022-03-11 NOTE — Telephone Encounter (Addendum)
Pt has an upcoming appt with DR. Agustin Cree 03/24/22. Per Lattie Haw, RN pt has been scheduled for echo to be done per Dr. Agustin Cree.   Per the pre op provider Coletta Memos, FNP pre op clearance to be addressed with Dr. Agustin Cree 03/24/22.

## 2022-03-13 NOTE — Progress Notes (Signed)
[name] ([id number]) [id number] Page 1 of 11 Visit Report for 03/11/2022 Chief Complaint Document Details Patient Name: Date of Service: [name], [name] 03/11/2022 8:00 A M Medical Record Number: [id number] Patient Account Number: [id number] Date of Birth/Sex: Treating RN: [birth year] (61 y.o. M) Primary Care Provider: Evonnie Dawes, Jenny Reichmann Other Clinician: Referring Provider: Treating Provider/Extender: Evette Georges in Treatment: 5 Information Obtained from: Patient Chief Complaint 05/20/2020; patient is here for review of wounds on his bilateral lower legs in the setting of severe chronic venous insufficiency with lymphedema 02/03/2022: returns to clinic today with new wounds on RLE Electronic Signature(s) Signed: 03/11/2022 8:28:55 AM By: Fredirick Maudlin MD FACS Entered By: Fredirick Maudlin on 03/11/2022 08:28:55 -------------------------------------------------------------------------------- Debridement Details Patient Name: Date of Service: Marvin Atlas, PA UL J. 03/11/2022 8:00 A M Medical Record Number: [id number] Patient Account Number: [id number] Date of Birth/Sex: Treating RN: 1960-07-07 (61 y.o. [name] Primary Care Provider: Evonnie Dawes, Jenny Reichmann Other Clinician: Referring Provider: Treating Provider/Extender: Evette Georges in Treatment: 5 Debridement Performed for Assessment: Wound #7 Right,Lateral Ankle Performed By: Physician Fredirick Maudlin, MD Debridement Type: Debridement Severity of Tissue Pre Debridement: Fat layer exposed Level of Consciousness (Pre-procedure): Awake and Alert Pre-procedure Verification/Time Out Yes - 08:18 Taken: Start Time: 08:19 T Area Debrided (L x W): otal 0.5 (cm) x 0.3 (cm) = 0.15 (cm) Tissue and other material debrided: Non-Viable, Slough, Slough Level: Non-Viable Tissue Debridement Description: Selective/Open Wound Instrument: Curette Bleeding:  Minimum Hemostasis Achieved: Pressure Response to Treatment: Procedure was tolerated well Level of Consciousness (Post- Awake and Alert procedure): Post Debridement Measurements of Total Wound Length: (cm) 0.5 Width: (cm) 0.3 Depth: (cm) 0.1 Volume: (cm) 0.012 Character of Wound/Ulcer Post Debridement: Requires Further Debridement Severity of Tissue Post Debridement: Fat layer exposed Post Procedure Diagnosis [name] ([id number]) [id number] Page 2 of 11 Same as Pre-procedure Notes Scribed for Dr. Celine Ahr by Blanche East, RN Electronic Signature(s) Signed: 03/11/2022 10:52:19 AM By: Fredirick Maudlin MD FACS Signed: 03/12/2022 7:35:29 PM By: Blanche East RN Entered By: Blanche East on 03/11/2022 08:20:19 -------------------------------------------------------------------------------- HPI Details Patient Name: Date of Service: Marvin Atlas, PA UL J. 03/11/2022 8:00 A M Medical Record Number: [id number] Patient Account Number: [id number] Date of Birth/Sex: Treating RN: 1960/05/23 (61 y.o. M) Primary Care Provider: Evonnie Dawes, Jenny Reichmann Other Clinician: Referring Provider: Treating Provider/Extender: Evette Georges in Treatment: 5 History of Present Illness HPI Description: ADMISSION 05/20/2020 This is a 61 year old man who is not a diabetic however he has a long history of chronic venous insufficiency with wounds on his lower extremities. He went to the wound care center in [place] for a long period of time with recurrent wounds in these areas treated with compression wraps. He comes in today with wounds on his bilateral lower legs that he says have been there about a year on the right and about 6 months on the left. He went to see Dr. Oneida Alar on 01/17/2020 who noted that he had no history of DVT but does have a history of venous insufficiency wounds. They noted that he was considering laser ablation in October 2020 but he canceled the  procedure. He had an episode of bleeding from varicose veins in 2019. He had repeated venous reflux study on 10/6 which showed no evidence of a DVT or SVT Noted that he had venous reflux in the right greater saphenous vein in the thigh and the calf on the right. Also .  noted to have venous reflux in the right femoral vein right popliteal vein and right small saphenous vein. At the time in October they wanted the wounds to heal a bit he also also had a right groin wound which is apparently closed. Not sure about the follow-up with vein and vascular. He comes in today with some light compression wraps and collagen on the wound placed by Dr. Lilia Pro who is a Education officer, environmental in [place]. He has 2 areas on the left leg 1 anteriorly and a large area posteriorly and a smaller area on the right anterior lower leg. He has marked lower extremity edema which is nonpitting. Hemosiderin deposition on both lower legs extensively. There is dilated venules in his feet. Past medical history includes history of opioid abuse in remission, left nephrolithiasis, history of an ablation although I did not see this in Dr. Oneida Alar notes, congestive heart failure and anemia ABIs in our clinic were noncompressible bilaterally 05/27/2020; we admitted this patient to clinic last week. He has bilateral superficial venous insufficiency with bilateral wounds left greater than right. We put him in 4 layer compression with silver alginate. The he was previously cared for in the [facility] wound care clinic when it was open. His wounds are better with compression this week and there is certainly less swelling. 2/21; bilateral superficial venous insufficiency wounds. A cluster on the left lateral. He has very well-defined areas on the right anterior right and right lateral. We are using silver alginate and 4-layer compression 2/28; bilateral superficial venous insufficiency wounds in the setting of significant stage III lymphedema. He has a  cluster on the left which is smaller. His area on the right anterior and right lateral also look better. 3/14 superficial bilateral venous insufficiency wounds. Much better on both sides in fact the area on the right posterior is healed. He still has 2 small open areas on the right anterior and the left lateral 3/21; superficial bilateral venous insufficiency wounds. Everything is closed on the right side. Small area still remains on the left. We will transition him today to his right extremiteaze stocking. Still dressing the remaining wound on the left with compression 3/28; the patient's right leg remains closed in his wraparound stocking. Disappointingly today the area on the left lateral leg looks about the same. I thought this might be closed with been using silver alginate 4/4; right leg remains in his wraparound stocking.. Left lateral leg measures smaller even under illumination the wound looks good. Changed him to [facility] with additional ABD compression last week 4/11; left lateral posterior very tiny superficial wound. This should be closed by next week. Using Hydrofera Blue. 4/18; left lateral wound is totally epithelialized. He has his own external compression stocking and compression pumps. READMISSION 02/03/2022 This is a 61 year old man well-known to the wound care center. He has chronic venous insufficiency and lymphedema. He has been evaluated in the past and has documented venous reflux, but thus far, has not undergone ablation. He returns to clinic today with multiple wounds on his right lower extremity. He reports that the medial lower leg wound started when something sharp stuck into his leg about a year ago. He says he was trying to treat this at home and took a course of cephalexin that he had leftover from a previous event. It did not improve and in the past 3 to 4 weeks, it has actually gotten worse, to the point that he is unable to use his lymphedema pumps secondary  to  pain. He has another ulcer on his left lateral ankle and one between his first and second toes, both present for about 3 to 4 weeks, per his report. On his right medial lower leg, there are 2 ulcers with slough accumulated. The surface underneath the slough is quite fibrous, consistent with his history of multiple lower leg ulcerations. He has a triangular wound on his right lateral ankle, similar in appearance to the others. Between his first and second toes, he has what appears to be athlete's foot, with a fungal odor and skin breakdown with eschar and slough present. 02/09/2022: On the right medial lower leg wound, there is significant slough accumulation. The right lateral ankle wound is gray and the patient reports an increase in pain in this site. The area between his first and second toes has dried out considerably. The more proximal ulcer on his right posterior leg has some slough buildup over a fibrotic surface. Edema control is markedly improved. [name], [name] ([id number]) [id number] Page 3 of 11 02/17/2022: The right medial lower leg and right lateral ankle wound still have a lot of slough accumulation, but they are less painful. The culture that I took last week grew out multiple gram-positive species. The area between his first and second toes still has a consistent appearance with athlete's foot. The more proximal lateral lower leg wound has healed. He still has not been scheduled for his repeat venous reflux studies. 02/25/2022: He had his repeat reflux studies done but for some reason they only did the right leg. He does have evidence of reflux. In the past, bilateral studies were performed and he has significant venous reflux in both legs. His edema control is excellent this week. The areas between his toes are drying up. The right medial lower leg wound is substantially smaller with just a little bit of eschar overlying the open area. The lateral ankle  wound still has some slough and eschar accumulation and remains fairly tender. 03/11/2022: The right medial leg wound has closed. The lateral ankle wound is substantially smaller with just some slough and eschar present. It is tender, but less so than on previous visits. He is still having some issues with moisture between his toes and athlete's foot. His wrap slipped and the area above the wrap is markedly swollen, well the areas that was still contained in the wrap has excellent edema control. Electronic Signature(s) Signed: 03/11/2022 8:30:42 AM By: Fredirick Maudlin MD FACS Entered By: Fredirick Maudlin on 03/11/2022 08:30:42 -------------------------------------------------------------------------------- Physical Exam Details Patient Name: Date of Service: Marvin Atlas, PA UL J. 03/11/2022 8:00 A M Medical Record Number: [id number] Patient Account Number: [id number] Date of Birth/Sex: Treating RN: [birth year] (61 y.o. M) Primary Care Provider: Evonnie Dawes, Jenny Reichmann Other Clinician: Referring Provider: Treating Provider/Extender: Evette Georges in Treatment: 5 Constitutional . . . . No acute distress. Respiratory Normal work of breathing on room air. Notes 03/11/2022: The right medial leg wound has closed. The lateral ankle wound is substantially smaller with just some slough and eschar present. It is tender, but less so than on previous visits. He is still having some issues with moisture between his toes and athlete's foot. His wrap slipped and the area above the wrap is markedly swollen, well the areas that was still contained in the wrap has excellent edema control. Electronic Signature(s) Signed: 03/11/2022 8:31:28 AM By: Fredirick Maudlin MD FACS Entered By: Fredirick Maudlin on 03/11/2022 08:31:28 -------------------------------------------------------------------------------- Physician Orders Details Patient Name: Date of Service:  Marvin Atlas, PA UL J. 03/11/2022 8:00  A M Medical Record Number: [id number] Patient Account Number: [id number] Date of Birth/Sex: Treating RN: 13-May-1960 (61 y.o. [name] Primary Care Provider: Evonnie Dawes, Jenny Reichmann Other Clinician: Referring Provider: Treating Provider/Extender: Evette Georges in Treatment: 5 Verbal / Phone Orders: No Diagnosis Coding ICD-10 Coding Code Description [phone] Chronic venous hypertension (idiopathic) with ulcer and inflammation of right lower extremity 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 LYRIC, [name] ([id number]) [id number] Page 4 of 11 Follow-up Appointments ppointment in 1 week. - Dr. Celine Ahr rm 4 Return A Other: - patient to purchase over the counter zinc oxide (desitin) apply between toes. Vein and Vascular Speak with Damita Dunnings about venous studies phone [phone]. Appt scheduled 02/19/2022 0900. Anesthetic (In clinic) Topical Lidocaine 4% applied to wound bed - all wounds prior to debridement Bathing/ Shower/ Hygiene May shower with protection but do not get wound dressing(s) wet. Edema Control - Lymphedema / SCD / Other Lymphedema Pumps. Use Lymphedema pumps on leg(s) 2-3 times a day for 45-60 minutes. If wearing any wraps or hose, do not remove them. Continue exercising as instructed. Elevate legs to the level of the heart or above for 30 minutes daily and/or when sitting, a frequency of: Avoid standing for long periods of time. Exercise regularly Compression stocking or Garment 20-30 mm/Hg pressure to: - toleftleg daily Wound Treatment Wound #6 - T - Web between 1st and 2nd oe Wound Laterality: Right Cleanser: Soap and Water 1 x Per Week/7 Days Discharge Instructions: May shower and wash wound with dial antibacterial soap and water prior to dressing change. Cleanser: Wound Cleanser 1 x Per Week/7  Days Discharge Instructions: Cleanse the wound with wound cleanser prior to applying a clean dressing using gauze sponges, not tissue or cotton balls. Peri-Wound Care: Sween Lotion (Moisturizing lotion) 1 x Per Week/7 Days Discharge Instructions: Apply moisturizing lotion as directed Topical: Mupirocin Ointment 1 x Per Week/7 Days Discharge Instructions: Apply Mupirocin (Bactroban) as instructed Prim Dressing: KerraCel Ag Gelling Fiber Dressing, 4x5 in (silver alginate) 1 x Per Week/7 Days ary Discharge Instructions: Apply silver alginate to wound bed as instructed Secondary Dressing: ABD Pad, 5x9 1 x Per Week/7 Days Discharge Instructions: Apply over primary dressing as directed. Secondary Dressing: Woven Gauze Sponge, Non-Sterile 4x4 in 1 x Per Week/7 Days Discharge Instructions: Apply over primary dressing as directed. Compression Wrap: FourPress (4 layer compression wrap) 1 x Per Week/7 Days Discharge Instructions: Apply four layer compression as directed. May also use Miliken CoFlex 2 layer compression system as alternative. Wound #7 - Ankle Wound Laterality: Right, Lateral Cleanser: Soap and Water 1 x Per Week/7 Days Discharge Instructions: May shower and wash wound with dial antibacterial soap and water prior to dressing change. Cleanser: Wound Cleanser 1 x Per Week/7 Days Discharge Instructions: Cleanse the wound with wound cleanser prior to applying a clean dressing using gauze sponges, not tissue or cotton balls. Peri-Wound Care: Sween Lotion (Moisturizing lotion) 1 x Per Week/7 Days Discharge Instructions: Apply moisturizing lotion as directed Topical: Mupirocin Ointment 1 x Per Week/7 Days Discharge Instructions: Apply Mupirocin (Bactroban) as instructed Prim Dressing: KerraCel Ag Gelling Fiber Dressing, 4x5 in (silver alginate) 1 x Per Week/7 Days ary Discharge Instructions: Apply silver alginate to wound bed as instructed Secondary Dressing: ABD Pad, 5x9 1 x Per Week/7  Days Discharge Instructions: Apply over primary dressing  as directed. Secondary Dressing: Woven Gauze Sponge, Non-Sterile 4x4 in 1 x Per Week/7 Days Discharge Instructions: Apply over primary dressing as directed. Compression Wrap: FourPress (4 layer compression wrap) 1 x Per Week/7 Days Discharge Instructions: Apply four layer compression as directed. May also use Miliken CoFlex 2 layer compression system as alternative. Electronic Signature(s) Signed: 03/11/2022 10:52:19 AM By: Fredirick Maudlin MD FACS Entered By: Fredirick Maudlin on 03/11/2022 08:31:48 [name] ([id number]) [id number] Page 5 of 11 -------------------------------------------------------------------------------- Problem List Details Patient Name: Date of Service: [name], [name] 03/11/2022 8:00 A M Medical Record Number: [id number] Patient Account Number: [id number] Date of Birth/Sex: Treating RN: Jul 05, 1960 (61 y.o. M) Primary Care Provider: Evonnie Dawes, Jenny Reichmann Other Clinician: Referring Provider: Treating Provider/Extender: Evette Georges in Treatment: 5 Active Problems ICD-10 Encounter Code Description Active Date MDM Diagnosis I87.331 Chronic venous hypertension (idiopathic) with ulcer and inflammation of right 02/03/2022 No Yes lower extremity L97.312 Non-pressure chronic ulcer of right ankle with fat layer exposed 02/03/2022 No Yes L97.512 Non-pressure chronic ulcer of other part of right foot with fat layer exposed 02/03/2022 No Yes Inactive Problems Resolved Problems ICD-10 Code Description Active Date Resolved Date L97.812 Non-pressure chronic ulcer of other part of right lower leg with fat layer exposed 02/03/2022 02/03/2022 Electronic Signature(s) Signed: 03/11/2022 8:28:27 AM By: Fredirick Maudlin MD FACS Entered By: Fredirick Maudlin on 03/11/2022 08:28:27 -------------------------------------------------------------------------------- Progress  Note Details Patient Name: Date of Service: Marvin Atlas, PA UL J. 03/11/2022 8:00 A M Medical Record Number: [id number] Patient Account Number: [id number] Date of Birth/Sex: Treating RN: [birth year] (61 y.o. M) Primary Care Provider: Evonnie Dawes, Jenny Reichmann Other Clinician: Referring Provider: Treating Provider/Extender: Evette Georges in Treatment: 5 Subjective Chief Complaint Information obtained from Patient 05/20/2020; patient is here for review of wounds on his bilateral lower legs in the setting of severe chronic venous insufficiency with lymphedema 02/03/2022: returns to clinic today with new wounds on RLE [name], [name] ([id number]) [id number] Page 6 of 11 History of Present Illness (HPI) ADMISSION 05/20/2020 This is a 61 year old man who is not a diabetic however he has a long history of chronic venous insufficiency with wounds on his lower extremities. He went to the wound care center in [place] for a long period of time with recurrent wounds in these areas treated with compression wraps. He comes in today with wounds on his bilateral lower legs that he says have been there about a year on the right and about 6 months on the left. He went to see Dr. Oneida Alar on 01/17/2020 who noted that he had no history of DVT but does have a history of venous insufficiency wounds. They noted that he was considering laser ablation in October 2020 but he canceled the procedure. He had an episode of bleeding from varicose veins in 2019. He had repeated venous reflux study on 10/6 which showed no evidence of a DVT or SVT Noted that he had venous reflux in the right greater saphenous vein in the thigh and the calf on the right. Also . noted to have venous reflux in the right femoral vein right popliteal vein and right small saphenous vein. At the time in October they wanted the wounds to heal a bit he also also had a right groin wound which is apparently closed. Not  sure about the follow-up with vein and vascular. He comes in today with some light compression wraps and collagen on the wound placed by Dr. Lilia Pro who is  a Education officer, environmental in [place]. He has 2 areas on the left leg 1 anteriorly and a large area posteriorly and a smaller area on the right anterior lower leg. He has marked lower extremity edema which is nonpitting. Hemosiderin deposition on both lower legs extensively. There is dilated venules in his feet. Past medical history includes history of opioid abuse in remission, left nephrolithiasis, history of an ablation although I did not see this in Dr. Oneida Alar notes, congestive heart failure and anemia ABIs in our clinic were noncompressible bilaterally 05/27/2020; we admitted this patient to clinic last week. He has bilateral superficial venous insufficiency with bilateral wounds left greater than right. We put him in 4 layer compression with silver alginate. The he was previously cared for in the [facility] wound care clinic when it was open. His wounds are better with compression this week and there is certainly less swelling. 2/21; bilateral superficial venous insufficiency wounds. A cluster on the left lateral. He has very well-defined areas on the right anterior right and right lateral. We are using silver alginate and 4-layer compression 2/28; bilateral superficial venous insufficiency wounds in the setting of significant stage III lymphedema. He has a cluster on the left which is smaller. His area on the right anterior and right lateral also look better. 3/14 superficial bilateral venous insufficiency wounds. Much better on both sides in fact the area on the right posterior is healed. He still has 2 small open areas on the right anterior and the left lateral 3/21; superficial bilateral venous insufficiency wounds. Everything is closed on the right side. Small area still remains on the left. We will transition him today to his right extremiteaze  stocking. Still dressing the remaining wound on the left with compression 3/28; the patient's right leg remains closed in his wraparound stocking. Disappointingly today the area on the left lateral leg looks about the same. I thought this might be closed with been using silver alginate 4/4; right leg remains in his wraparound stocking.. Left lateral leg measures smaller even under illumination the wound looks good. Changed him to [facility] with additional ABD compression last week 4/11; left lateral posterior very tiny superficial wound. This should be closed by next week. Using Hydrofera Blue. 4/18; left lateral wound is totally epithelialized. He has his own external compression stocking and compression pumps. READMISSION 02/03/2022 This is a 61 year old man well-known to the wound care center. He has chronic venous insufficiency and lymphedema. He has been evaluated in the past and has documented venous reflux, but thus far, has not undergone ablation. He returns to clinic today with multiple wounds on his right lower extremity. He reports that the medial lower leg wound started when something sharp stuck into his leg about a year ago. He says he was trying to treat this at home and took a course of cephalexin that he had leftover from a previous event. It did not improve and in the past 3 to 4 weeks, it has actually gotten worse, to the point that he is unable to use his lymphedema pumps secondary to pain. He has another ulcer on his left lateral ankle and one between his first and second toes, both present for about 3 to 4 weeks, per his report. On his right medial lower leg, there are 2 ulcers with slough accumulated. The surface underneath the slough is quite fibrous, consistent with his history of multiple lower leg ulcerations. He has a triangular wound on his right lateral ankle, similar in appearance to  the others. Between his first and second toes, he has what appears to be  athlete's foot, with a fungal odor and skin breakdown with eschar and slough present. 02/09/2022: On the right medial lower leg wound, there is significant slough accumulation. The right lateral ankle wound is gray and the patient reports an increase in pain in this site. The area between his first and second toes has dried out considerably. The more proximal ulcer on his right posterior leg has some slough buildup over a fibrotic surface. Edema control is markedly improved. 02/17/2022: The right medial lower leg and right lateral ankle wound still have a lot of slough accumulation, but they are less painful. The culture that I took last week grew out multiple gram-positive species. The area between his first and second toes still has a consistent appearance with athlete's foot. The more proximal lateral lower leg wound has healed. He still has not been scheduled for his repeat venous reflux studies. 02/25/2022: He had his repeat reflux studies done but for some reason they only did the right leg. He does have evidence of reflux. In the past, bilateral studies were performed and he has significant venous reflux in both legs. His edema control is excellent this week. The areas between his toes are drying up. The right medial lower leg wound is substantially smaller with just a little bit of eschar overlying the open area. The lateral ankle wound still has some slough and eschar accumulation and remains fairly tender. 03/11/2022: The right medial leg wound has closed. The lateral ankle wound is substantially smaller with just some slough and eschar present. It is tender, but less so than on previous visits. He is still having some issues with moisture between his toes and athlete's foot. His wrap slipped and the area above the wrap is markedly swollen, well the areas that was still contained in the wrap has excellent edema control. Patient History Information obtained from Patient. Family  History Cancer, Diabetes - Father, Hypertension - Father, No family history of Heart Disease, Hereditary Spherocytosis, Kidney Disease, Lung Disease, Seizures, Stroke, Thyroid Problems, Tuberculosis. Social History Current every day smoker, Marital Status - Divorced, Alcohol Use - Never, Drug Use - Prior History, Caffeine Use - Rarely. Medical History Eyes Denies history of Cataracts, Glaucoma, Optic Neuritis Ear/Nose/Mouth/Throat Denies history of Chronic sinus problems/congestion, Middle ear problems Hematologic/Lymphatic Patient has history of Anemia Denies history of Hemophilia, Human Immunodeficiency Virus, Lymphedema, Sickle Cell Disease Respiratory Patient has history of Sleep Apnea Denies history of Aspiration, Asthma, Chronic Obstructive Pulmonary Disease (COPD), Pneumothorax, Tuberculosis Cardiovascular [name], [name] ([id number]) [id number] Page 7 of 11 Patient has history of Congestive Heart Failure, Coronary Artery Disease, Peripheral Venous Disease Denies history of Angina, Arrhythmia, Deep Vein Thrombosis, Hypertension, Hypotension, Myocardial Infarction, Peripheral Arterial Disease, Phlebitis, Vasculitis Gastrointestinal Denies history of Cirrhosis , Colitis, Crohnoos, Hepatitis A, Hepatitis B, Hepatitis C Endocrine Denies history of Type I Diabetes, Type II Diabetes Genitourinary Denies history of End Stage Renal Disease Immunological Denies history of Lupus Erythematosus, Raynaudoos, Scleroderma Integumentary (Skin) Denies history of History of Burn Musculoskeletal Patient has history of Osteoarthritis Denies history of Gout, Rheumatoid Arthritis, Osteomyelitis Neurologic Denies history of Dementia, Neuropathy, Quadriplegia, Paraplegia, Seizure Disorder Oncologic Denies history of Received Chemotherapy, Received Radiation Psychiatric Denies history of Anorexia/bulimia, Confinement Anxiety Hospitalization/Surgery History - right  hip nailing 10/17/2019. - 05/17/2018 nephrolithotomy left. - 01/29/2017 right lymph noes biopsy. - cystoscopy 2017, 2018. - 2017 gastric bypass surgery. Medical A Surgical History Notes nd Gastrointestinal  gastric bypass Objective Constitutional No acute distress. Vitals Time Taken: 7:50 AM, Height: 71 in, Weight: 230 lbs, BMI: 32.1, Temperature: 978 F, Pulse: 61 bpm, Respiratory Rate: 18 breaths/min, Blood Pressure: 107/69 mmHg. Respiratory Normal work of breathing on room air. General Notes: 03/11/2022: The right medial leg wound has closed. The lateral ankle wound is substantially smaller with just some slough and eschar present. It is tender, but less so than on previous visits. He is still having some issues with moisture between his toes and athlete's foot. His wrap slipped and the area above the wrap is markedly swollen, well the areas that was still contained in the wrap has excellent edema control. Integumentary (Hair, Skin) Wound #6 status is Open. Original cause of wound was Gradually Appeared. The date acquired was: 01/11/2022. The wound has been in treatment 5 weeks. The wound is located on the Right T - Web between 1st and 2nd. The wound measures 1.5cm length x 1cm width x 0.1cm depth; 1.178cm^2 area and 0.118cm^3 oe volume. There is Fat Layer (Subcutaneous Tissue) exposed. There is no tunneling or undermining noted. There is a medium amount of serosanguineous drainage noted. The wound margin is distinct with the outline attached to the wound base. There is medium (34-66%) pink granulation within the wound bed. There is a medium (34-66%) amount of necrotic tissue within the wound bed including Adherent Slough. The periwound skin appearance exhibited: Maceration. The periwound skin appearance did not exhibit: Callus, Crepitus, Excoriation, Induration, Rash, Scarring, Dry/Scaly, Atrophie Blanche, Cyanosis, Ecchymosis, Hemosiderin Staining, Mottled, Pallor, Rubor, Erythema. Periwound  temperature was noted as No Abnormality. The periwound has tenderness on palpation. Wound #7 status is Open. Original cause of wound was Gradually Appeared. The date acquired was: 01/11/2022. The wound has been in treatment 5 weeks. The wound is located on the Right,Lateral Ankle. The wound measures 0.5cm length x 0.3cm width x 0.1cm depth; 0.118cm^2 area and 0.012cm^3 volume. There is Fat Layer (Subcutaneous Tissue) exposed. There is no tunneling or undermining noted. There is a medium amount of serosanguineous drainage noted. The wound margin is distinct with the outline attached to the wound base. There is medium (34-66%) red, pink granulation within the wound bed. There is a medium (34-66%) amount of necrotic tissue within the wound bed including Adherent Slough. The periwound skin appearance did not exhibit: Callus, Crepitus, Excoriation, Induration, Rash, Scarring, Dry/Scaly, Maceration, Atrophie Blanche, Cyanosis, Ecchymosis, Hemosiderin Staining, Mottled, Pallor, Rubor, Erythema. Periwound temperature was noted as No Abnormality. The periwound has tenderness on palpation. Wound #8 status is Open. Original cause of wound was Gradually Appeared. The date acquired was: 01/11/2021. The wound has been in treatment 4 weeks. The wound is located on the Right,Posterior Lower Leg. The wound measures 0cm length x 0cm width x 0cm depth; 0cm^2 area and 0cm^3 volume. There is Fat Layer (Subcutaneous Tissue) exposed. There is no tunneling or undermining noted. There is a none present amount of drainage noted. The wound margin is distinct with the outline attached to the wound base. There is large (67-100%) red granulation within the wound bed. There is a small (1-33%) amount of necrotic tissue within the wound bed including Adherent Slough. The periwound skin appearance had no abnormalities noted for texture. The periwound skin appearance exhibited: Hemosiderin Staining. The periwound skin appearance did not  exhibit: Dry/Scaly, Maceration. Periwound temperature was noted as No Abnormality. The periwound has tenderness on palpation. 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 There was a Selective/Open Wound Non-Viable Tissue Debridement with a total area of 0.15 sq cm performed by Fredirick Maudlin, MD. With the following instrument(s): Curette to remove Non-Viable tissue/material. Material removed includes [facility]. No specimens were taken. A time out was conducted at 08:18, prior to the start of the procedure. A Minimum amount of bleeding was controlled with Pressure. The procedure was tolerated well. Post Debridement Measurements: 0.5cm length x 0.3cm width x 0.1cm depth; 0.012cm^3 volume. Character of Wound/Ulcer Post Debridement requires further debridement. Severity of Tissue Post Debridement is: Fat layer exposed. Post procedure Diagnosis Wound #7: Same as Pre-Procedure General Notes: Scribed for Dr. Celine Ahr by Blanche East, RN. Plan Follow-up Appointments: Return Appointment in 1 week. - Dr. Celine Ahr rm 4 Other: - patient to purchase over the counter zinc oxide (desitin) apply between toes. Vein and Vascular Speak with Damita Dunnings about venous studies phone #[phone]. Appt scheduled 02/19/2022 0900. Anesthetic: (In clinic) Topical Lidocaine 4% applied to wound bed - all wounds prior to debridement Bathing/ Shower/ Hygiene: May shower with protection but do not get wound dressing(s) wet. Edema Control - Lymphedema / SCD /  Other: Lymphedema Pumps. Use Lymphedema pumps on leg(s) 2-3 times a day for 45-60 minutes. If wearing any wraps or hose, do not remove them. Continue exercising as instructed. Elevate legs to the level of the heart or above for 30 minutes daily and/or when sitting, a frequency of: Avoid standing for long periods of time. Exercise regularly Compression stocking or Garment 20-30 mm/Hg pressure to: - toleftleg daily WOUND #6: - T - Web between 1st and 2nd Wound Laterality: Right oe Cleanser: Soap and Water 1 x Per Week/7 Days Discharge Instructions: May shower and wash wound with dial antibacterial soap and water prior to dressing change. Cleanser: Wound Cleanser 1 x Per Week/7 Days Discharge Instructions: Cleanse the wound with wound cleanser prior to applying a clean dressing using gauze sponges, not tissue or cotton balls. Peri-Wound Care: Sween Lotion (Moisturizing lotion) 1 x Per Week/7 Days Discharge Instructions: Apply moisturizing lotion as directed Topical: Mupirocin Ointment 1 x Per Week/7 Days Discharge Instructions: Apply Mupirocin (Bactroban) as instructed Prim Dressing: KerraCel Ag Gelling Fiber Dressing, 4x5 in (silver alginate) 1 x Per Week/7 Days ary Discharge Instructions: Apply silver alginate to wound bed as instructed Secondary Dressing: ABD Pad, 5x9 1 x Per Week/7 Days Discharge Instructions: Apply over primary dressing as directed. Secondary Dressing: Woven Gauze Sponge, Non-Sterile 4x4 in 1 x Per Week/7 Days Discharge Instructions: Apply over primary dressing as directed. Com pression Wrap: FourPress (4 layer compression wrap) 1 x Per Week/7 Days Discharge Instructions: Apply four layer compression as directed. May also use Miliken CoFlex 2 layer compression system as alternative. WOUND #7: - Ankle Wound Laterality: Right, Lateral Cleanser: Soap and Water 1 x Per Week/7 Days Discharge Instructions: May shower and wash wound with dial antibacterial soap and water prior  to dressing change. Cleanser: Wound Cleanser 1 x Per Week/7 Days Discharge Instructions: Cleanse the wound with wound cleanser prior to applying a clean dressing using gauze sponges, not tissue or cotton balls. Peri-Wound Care: Sween Lotion (Moisturizing lotion) 1 x Per Week/7 Days Discharge  Instructions: Apply moisturizing lotion as directed Topical: Mupirocin Ointment 1 x Per Week/7 Days Discharge Instructions: Apply Mupirocin (Bactroban) as instructed Prim Dressing: KerraCel Ag Gelling Fiber Dressing, 4x5 in (silver alginate) 1 x Per Week/7 Days ary Discharge Instructions: Apply silver alginate to wound bed as instructed Secondary Dressing: ABD Pad, 5x9 1 x Per Week/7 Days Discharge Instructions: Apply over primary dressing as directed. Secondary Dressing: Woven Gauze Sponge, Non-Sterile 4x4 in 1 x Per Week/7 Days Discharge Instructions: Apply over primary dressing as directed. Com pression Wrap: FourPress (4 layer compression wrap) 1 x Per Week/7 Days Discharge Instructions: Apply four layer compression as directed. May also use Miliken CoFlex 2 layer compression system as alternative. 03/11/2022: The right medial leg wound has closed. The lateral ankle wound is substantially smaller with just some slough and eschar present. It is tender, but less so than on previous visits. He is still having some issues with moisture between his toes and athlete's foot. His wrap slipped and the area above the wrap is markedly swollen, well the areas that was still contained in the wrap has excellent edema control. I used a curette to debride slough and eschar from the lateral ankle wound. I recommended that instead of an ointment or cream, that he try using over-the- counter athlete's foot spray powder to treat the area between his toes. We are going to try a zinc-impregnated Coflex wrap to see if this will remain in place and also dry up some of the small blisters that formed as result of his 4-layer wrap  sliding down on his leg. Continue topical mupirocin with silver alginate to the lateral ankle wound. Follow-up in 1 week. [name], [name] ([id number]) [id number] Page 9 of 11 Electronic Signature(s) Signed: 03/11/2022 8:33:04 AM By: Fredirick Maudlin MD FACS Entered By: Fredirick Maudlin on 03/11/2022 08:33:03 -------------------------------------------------------------------------------- HxROS Details Patient Name: Date of Service: Marvin Atlas, PA UL J. 03/11/2022 8:00 A M Medical Record Number: [id number] Patient Account Number: [id number] Date of Birth/Sex: Treating RN: 14-Jul-1960 (61 y.o. M) Primary Care Provider: Evonnie Dawes, Jenny Reichmann Other Clinician: Referring Provider: Treating Provider/Extender: Evette Georges in Treatment: 5 Information Obtained From Patient Eyes Medical History: Negative for: Cataracts; Glaucoma; Optic Neuritis Ear/Nose/Mouth/Throat Medical History: Negative for: Chronic sinus problems/congestion; Middle ear problems Hematologic/Lymphatic Medical History: Positive for: Anemia Negative for: Hemophilia; Human Immunodeficiency Virus; Lymphedema; Sickle Cell Disease Respiratory Medical History: Positive for: Sleep Apnea Negative for: Aspiration; Asthma; Chronic Obstructive Pulmonary Disease (COPD); Pneumothorax; Tuberculosis Cardiovascular Medical History: Positive for: Congestive Heart Failure; Coronary Artery Disease; Peripheral Venous Disease Negative for: Angina; Arrhythmia; Deep Vein Thrombosis; Hypertension; Hypotension; Myocardial Infarction; Peripheral Arterial Disease; Phlebitis; Vasculitis Gastrointestinal Medical History: Negative for: Cirrhosis ; Colitis; Crohns; Hepatitis A; Hepatitis B; Hepatitis C Past Medical History Notes: gastric bypass Endocrine Medical History: Negative for: Type I Diabetes; Type II Diabetes Genitourinary Medical History: Negative for: End Stage Renal  Disease Immunological Medical History: Negative for: Lupus Erythematosus; Raynauds; Scleroderma Integumentary (Skin) [name], [name] ([id number]) [id number] Page 10 of 11 Medical History: Negative for: History of Burn Musculoskeletal Medical History: Positive for: Osteoarthritis Negative for: Gout; Rheumatoid Arthritis; Osteomyelitis Neurologic Medical History: Negative for: Dementia; Neuropathy; Quadriplegia; Paraplegia; Seizure Disorder Oncologic Medical History: Negative for: Received Chemotherapy; Received Radiation Psychiatric Medical History: Negative for: Anorexia/bulimia; Confinement Anxiety Immunizations Pneumococcal Vaccine: Received Pneumococcal Vaccination: Yes Received Pneumococcal Vaccination On or After 60th Birthday: Yes Implantable Devices None Hospitalization / Surgery History Type of Hospitalization/Surgery right hip nailing 10/17/2019 05/17/2018 nephrolithotomy left 01/29/2017 right  lymph noes biopsy cystoscopy 2017, 2018 2017 gastric bypass surgery Family and Social History Cancer: Yes; Diabetes: Yes - Father; Heart Disease: No; Hereditary Spherocytosis: No; Hypertension: Yes - Father; Kidney Disease: No; Lung Disease: No; Seizures: No; Stroke: No; Thyroid Problems: No; Tuberculosis: No; Current every day smoker; Marital Status - Divorced; Alcohol Use: Never; Drug Use: Prior History; Caffeine Use: Rarely; Financial Concerns: No; Food, Clothing or Shelter Needs: No; Support System Lacking: No; Transportation Concerns: No Electronic Signature(s) Signed: 03/11/2022 10:52:19 AM By: Fredirick Maudlin MD FACS Entered By: Fredirick Maudlin on 03/11/2022 08:31:04 -------------------------------------------------------------------------------- SuperBill Details Patient Name: Date of Service: Marvin Atlas, PA UL J. 03/11/2022 Medical Record Number: [id number] Patient Account Number: [id number] Date of Birth/Sex: Treating RN: 1961-02-25 (61 y.o.  M) Primary Care Provider: Evonnie Dawes, Jenny Reichmann Other Clinician: Referring Provider: Treating Provider/Extender: Evette Georges in Treatment: 5 Diagnosis Coding ICD-10 Codes Code Description [phone] Chronic venous hypertension (idiopathic) with ulcer and inflammation of right lower extremity L97.312 Non-pressure chronic ulcer of right ankle with fat layer exposed [name], [name] ([id number]) [id number] Page 11 of 11 L97.512 Non-pressure chronic ulcer of other part of right foot with fat layer exposed Facility Procedures : CPT4 Code: [id number] Description: 85027 - DEBRIDE WOUND 1ST 20 SQ CM OR < ICD-10 Diagnosis Description L97.312 Non-pressure chronic ulcer of right ankle with fat layer exposed Modifier: Quantity: 1 Physician Procedures : CPT4 Code Description Modifier [id number] - WC PHYS LEVEL 4 - EST PT 25 ICD-10 Diagnosis Description L97.312 Non-pressure chronic ulcer of right ankle with fat layer exposed L97.512 Non-pressure chronic ulcer of other part of right foot with fat  layer exposed I87.331 Chronic venous hypertension (idiopathic) with ulcer and inflammation of right lower extremity Quantity: 1 : [id number] - WC PHYS DEBR WO ANESTH 20 SQ CM ICD-10 Diagnosis Description G83.662 Non-pressure chronic ulcer of right ankle with fat layer exposed Quantity: 1 Electronic Signature(s) Signed: 03/11/2022 8:33:22 AM By: Fredirick Maudlin MD FACS Entered By: Fredirick Maudlin on 03/11/2022 08:33:22

## 2022-03-13 NOTE — Progress Notes (Signed)
JAELIN, FACKLER ([id number]) [id number] Page 1 of 10 Visit Report for 03/11/2022 Arrival Information Details Patient Name: Date of Service: [name], [place] J. 03/11/2022 8:00 A M Medical Record Number: [id number] Patient Account Number: [id number] Date of Birth/Sex: Treating RN: 10-13-1960 (61 y.o. [name] Primary Care Ferd Horrigan: Evonnie Dawes, Jenny Reichmann Other Clinician: Referring Grainne Knights: Treating Earnie Rockhold/Extender: Evette Georges in Treatment: 5 Visit Information History Since Last Visit Added or deleted any medications: No Patient Arrived: [name] Any new allergies or adverse reactions: No Arrival Time: 07:50 Had a fall or experienced change in No Accompanied By: father activities of daily living that may affect Transfer Assistance: None risk of falls: Patient Identification Verified: Yes Signs or symptoms of abuse/neglect since last visito No Secondary Verification Process Completed: Yes Hospitalized since last visit: No Patient Requires Transmission-Based Precautions: No Implantable device outside of the clinic excluding No Patient Has Alerts: No cellular tissue based products placed in the center since last visit: Has Compression in Place as Prescribed: Yes Pain Present Now: No Electronic Signature(s) Signed: 03/12/2022 7:35:29 PM By: Blanche East RN Entered By: Blanche East on 03/11/2022 07:53:27 -------------------------------------------------------------------------------- Encounter Discharge Information Details Patient Name: Date of Service: Marvin Atlas, [place] J. 03/11/2022 8:00 A M Medical Record Number: [id number] Patient Account Number: [id number] Date of Birth/Sex: Treating RN: 12-Aug-1960 (61 y.o. [name] Primary Care Chanee Henrickson: Evonnie Dawes, Jenny Reichmann Other Clinician: Referring Nelson Noone: Treating Bresha Hosack/Extender: Evette Georges in Treatment: 5 Encounter Discharge Information Items Post  Procedure Vitals Discharge Condition: Stable Temperature (F): 97.8 Ambulatory Status: Cane Pulse (bpm): 61 Discharge Destination: Home Respiratory Rate (breaths/min): 18 Transportation: Private Auto Blood Pressure (mmHg): 107/69 Accompanied By: father Schedule Follow-up Appointment: Yes Clinical Summary of Care: Electronic Signature(s) Signed: 03/12/2022 7:35:29 PM By: Blanche East RN Entered By: Blanche East on 03/11/2022 08:34:31 Adah Salvage ([id number]) [id number] Page 2 of 10 -------------------------------------------------------------------------------- Lower Extremity Assessment Details Patient Name: Date of Service: [name], [name] 03/11/2022 8:00 A M Medical Record Number: [id number] Patient Account Number: [id number] Date of Birth/Sex: Treating RN: 02/23/1961 (61 y.o. [name] Primary Care Dakota Stangl: Evonnie Dawes, Jenny Reichmann Other Clinician: Referring Ayda Tancredi: Treating Cannon Arreola/Extender: Evette Georges in Treatment: 5 Edema Assessment Assessed: [Left: No] [Right: No] Edema: [Left: Ye] [Right: s] Calf Left: Right: Point of Measurement: 42.5 cm From Medial Instep 49.5 cm Ankle Left: Right: Point of Measurement: 11.5 cm From Medial Instep 23 cm Vascular Assessment Pulses: Dorsalis Pedis Palpable: [Right:Yes] Electronic Signature(s) Signed: 03/12/2022 7:35:29 PM By: Blanche East RN Entered By: Blanche East on 03/11/2022 07:56:14 -------------------------------------------------------------------------------- Multi Wound Chart Details Patient Name: Date of Service: Marvin Atlas, PA UL J. 03/11/2022 8:00 A M Medical Record Number: [id number] Patient Account Number: [id number] Date of Birth/Sex: Treating RN: 21-Feb-1961 (61 y.o. M) Primary Care Nayan Proch: Evonnie Dawes, Jenny Reichmann Other Clinician: Referring Dajanee Voorheis: Treating Shamarr Faucett/Extender: Evette Georges in Treatment: 5 Vital  Signs Height(in): 71 Pulse(bpm): 61 Weight(lbs): 230 Blood Pressure(mmHg): 107/69 Body Mass Index(BMI): 32.1 Temperature(F): 630 Respiratory Rate(breaths/min): 18 [6:Photos:] [[id number] Page 3 of 10] Right T - Web between 1st and 2nd Right, Lateral Ankle oe Right, Posterior Lower Leg Wound Location: Gradually Appeared Gradually Appeared Gradually Appeared Wounding Event: Lymphedema Venous Leg Ulcer Venous Leg Ulcer Primary Etiology: Anemia, Sleep Apnea, Congestive Anemia, Sleep Apnea, Congestive Anemia, Sleep Apnea, Congestive Comorbid History: Heart Failure, Coronary Artery Heart Failure, Coronary Artery Heart Failure, Coronary Artery Disease, Peripheral Venous Disease, Disease, Peripheral  Venous Disease, Disease, Peripheral Venous Disease, Osteoarthritis Osteoarthritis Osteoarthritis 01/11/2022 01/11/2022 01/11/2021 Date Acquired: _0 Weeks of Treatment: Open Open Open Wound Status: No No No Wound Recurrence: 1.5x1x0.1 0.5x0.3x0.1 0x0x0 Measurements L x W x D (cm) 1.178 0.118 0 A (cm) : rea 0.118 0.012 0 Volume (cm) : 81.30% 94.40% 100.00% % Reduction in A rea: 90.60% 97.20% 100.00% % Reduction in Volume: Full Thickness With Exposed Support Full Thickness With Exposed Support Full Thickness Without Exposed Classification: Structures Structures Support Structures Medium Medium None Present Exudate A mount: Serosanguineous Serosanguineous N/A Exudate Type: red, brown red, brown N/A Exudate Color: Distinct, outline attached Distinct, outline attached Distinct, outline attached Wound Margin: Medium (34-66%) Medium (34-66%) Large (67-100%) Granulation A mount: Pink Red, Pink Red Granulation Quality: Medium (34-66%) Medium (34-66%) Small (1-33%) Necrotic A mount: Fat Layer (Subcutaneous Tissue): Yes Fat Layer (Subcutaneous Tissue): Yes Fat Layer (Subcutaneous Tissue): Yes Exposed Structures: Fascia: No Fascia: No Fascia:  No Tendon: No Tendon: No Tendon: No Muscle: No Muscle: No Muscle: No Joint: No Joint: No Joint: No Bone: No Bone: No Bone: No Small (1-33%) Medium (34-66%) Small (1-33%) Epithelialization: N/A Debridement - Selective/Open Wound N/A Debridement: Pre-procedure Verification/Time Out N/A 08:18 N/A Taken: N/A Slough N/A Tissue Debrided: N/A Non-Viable Tissue N/A Level: N/A 0.15 N/A Debridement A (sq cm): rea N/A Curette N/A Instrument: N/A Minimum N/A Bleeding: N/A Pressure N/A Hemostasis A chieved: N/A Procedure was tolerated well N/A Debridement Treatment Response: N/A 0.5x0.3x0.1 N/A Post Debridement Measurements L x W x D (cm) N/A 0.012 N/A Post Debridement Volume: (cm) Excoriation: No Excoriation: No No Abnormalities Noted Periwound Skin Texture: Induration: No Induration: No Callus: No Callus: No Crepitus: No Crepitus: No Rash: No Rash: No Scarring: No Scarring: No Maceration: Yes Maceration: No Maceration: No Periwound Skin Moisture: Dry/Scaly: No Dry/Scaly: No Dry/Scaly: No Atrophie Blanche: No Atrophie Blanche: No Hemosiderin Staining: Yes Periwound Skin Color: Cyanosis: No Cyanosis: No Ecchymosis: No Ecchymosis: No Erythema: No Erythema: No Hemosiderin Staining: No Hemosiderin Staining: No Mottled: No Mottled: No Pallor: No Pallor: No Rubor: No Rubor: No No Abnormality No Abnormality No Abnormality Temperature: Yes Yes Yes Tenderness on Palpation: N/A Debridement N/A Procedures Performed: Treatment Notes Electronic Signature(s) Signed: 03/11/2022 8:28:33 AM By: Fredirick Maudlin MD FACS Entered By: Fredirick Maudlin on 03/11/2022 08:28:33 -------------------------------------------------------------------------------- Multi-Disciplinary Care Plan Details Patient Name: Date of Service: Marvin Atlas, PA [place] J. 03/11/2022 8:00 A Wylene Simmer ([id number]) [id number] Page 4 of 10 Medical Record Number:  [id number] Patient Account Number: [id number] Date of Birth/Sex: Treating RN: 18-Oct-1960 (61 y.o. [name] Primary Care Ellington Greenslade: Evonnie Dawes, Jenny Reichmann Other Clinician: Referring Orlen Leedy: Treating Deanette Tullius/Extender: Evette Georges in Treatment: 5 Multidisciplinary Care Plan reviewed with physician Active Inactive Venous Leg Ulcer Nursing Diagnoses: Actual venous Insuffiency (use after diagnosis is confirmed) Knowledge deficit related to disease process and management Potential for venous Insuffiency (use before diagnosis confirmed) Goals: Non-invasive venous studies are completed as ordered Date Initiated: 02/03/2022 Target Resolution Date: 03/13/2022 Goal Status: Active Patient will maintain optimal edema control Date Initiated: 02/03/2022 Target Resolution Date: 03/04/2022 Goal Status: Active Patient/caregiver will verbalize understanding of disease process and disease management Date Initiated: 02/03/2022 Date Inactivated: 02/17/2022 Target Resolution Date: 02/17/2022 Goal Status: Met Interventions: Assess peripheral edema status every visit. Compression as ordered Provide education on venous insufficiency Notes: Wound/Skin Impairment Nursing Diagnoses: Impaired tissue integrity Knowledge deficit related to ulceration/compromised skin integrity Goals: Patient will have a decrease in wound volume by X% from  date: (specify in notes) Date Initiated: 02/03/2022 Target Resolution Date: 03/13/2022 Goal Status: Active Patient/caregiver will verbalize understanding of skin care regimen Date Initiated: 02/03/2022 Target Resolution Date: 03/12/2022 Goal Status: Active Ulcer/skin breakdown will have a volume reduction of 30% by week 4 Date Initiated: 02/03/2022 Date Inactivated: 03/11/2022 Target Resolution Date: 02/26/2022 Goal Status: Met Ulcer/skin breakdown will have a volume reduction of 50% by week 8 Date Initiated: 03/11/2022 Target  Resolution Date: 04/07/2022 Goal Status: Active Interventions: Assess patient/caregiver ability to obtain necessary supplies Assess patient/caregiver ability to perform ulcer/skin care regimen upon admission and as needed Assess ulceration(s) every visit Notes: Electronic Signature(s) Signed: 03/12/2022 7:35:29 PM By: Blanche East RN Entered By: Blanche East on 03/11/2022 08:07:32 Adah Salvage ([id number]) [id number] Page 5 of 10 -------------------------------------------------------------------------------- Pain Assessment Details Patient Name: Date of Service: [name], [name] 03/11/2022 8:00 A M Medical Record Number: [id number] Patient Account Number: [id number] Date of Birth/Sex: Treating RN: Apr 04, 1961 (61 y.o. [name] Primary Care Joniece Smotherman: Evonnie Dawes, Jenny Reichmann Other Clinician: Referring Satcha Storlie: Treating Daria Mcmeekin/Extender: Evette Georges in Treatment: 5 Active Problems Location of Pain Severity and Description of Pain Patient Has Paino No Site Locations Rate the pain. Current Pain Level: 0 Pain Management and Medication Current Pain Management: Electronic Signature(s) Signed: 03/12/2022 7:35:29 PM By: Blanche East RN Entered By: Blanche East on 03/11/2022 07:53:52 -------------------------------------------------------------------------------- Patient/Caregiver Education Details Patient Name: Date of Service: Marvin Atlas, PA [place] J. 11/29/2023andnbsp8:00 A M Medical Record Number: [id number] Patient Account Number: [id number] Date of Birth/Gender: Treating RN: 23-Jul-1960 (61 y.o. [name] Primary Care Physician: Evonnie Dawes, Jenny Reichmann Other Clinician: Referring Physician: Treating Physician/Extender: Evette Georges in Treatment: 5 Education Assessment Education Provided To: Patient Education Topics Provided Venous: Methods: Explain/Verbal Responses: Reinforcements needed, State  content correctly Electronic Signature(s) Signed: 03/12/2022 7:35:29 PM By: Blanche East RN Entered By: Blanche East on 03/11/2022 08:07:44 Adah Salvage ([id number]) [id number] Page 6 of 10 -------------------------------------------------------------------------------- Wound Assessment Details Patient Name: Date of Service: [name], [name] 03/11/2022 8:00 A M Medical Record Number: [id number] Patient Account Number: [id number] Date of Birth/Sex: Treating RN: 09-28-1960 (61 y.o. [name] Primary Care Lenola Lockner: Evonnie Dawes, Jenny Reichmann Other Clinician: Referring Kymoni Lesperance: Treating Chason Mciver/Extender: Evette Georges in Treatment: 5 Wound Status Wound Number: 6 Primary Lymphedema Etiology: Wound Location: Right T - Web between 1st and 2nd oe Wound Open Wounding Event: Gradually Appeared Status: Date Acquired: 01/11/2022 Comorbid Anemia, Sleep Apnea, Congestive Heart Failure, Coronary Artery Weeks Of Treatment: 5 History: Disease, Peripheral Venous Disease, Osteoarthritis Clustered Wound: No Photos Wound Measurements Length: (cm) 1.5 Width: (cm) 1 Depth: (cm) 0.1 Area: (cm) 1.178 Volume: (cm) 0.118 % Reduction in Area: 81.3% % Reduction in Volume: 90.6% Epithelialization: Small (1-33%) Tunneling: No Undermining: No Wound Description Classification: Full Thickness With Exposed Suppo Wound Margin: Distinct, outline attached Exudate Amount: Medium Exudate Type: Serosanguineous Exudate Color: red, brown rt Structures Foul Odor After Cleansing: No Slough/Fibrino Yes Wound Bed Granulation Amount: Medium (34-66%) Exposed Structure Granulation Quality: Pink Fascia Exposed: No Necrotic Amount: Medium (34-66%) Fat Layer (Subcutaneous Tissue) Exposed: Yes Necrotic Quality: Adherent Slough Tendon Exposed: No Muscle Exposed: No Joint Exposed: No Bone Exposed: No Periwound Skin Texture Texture Color No Abnormalities  Noted: No No Abnormalities Noted: No Callus: No Atrophie Blanche: No Crepitus: No Cyanosis: No Excoriation: No Ecchymosis: No Induration: No Erythema: No Rash: No Hemosiderin Staining: No Scarring: No Mottled: No Pallor: No Moisture Rubor: No  No Abnormalities Noted: No Dry / Scaly: No Temperature / Pain CHATHAM, HOWINGTON ([id number]) [id number] Page 7 of 10 Maceration: Yes Temperature: No Abnormality Tenderness on Palpation: Yes Treatment Notes Wound #6 (Toe - Web between 1st and 2nd) Wound Laterality: Right Cleanser Soap and Water Discharge Instruction: May shower and wash wound with dial antibacterial soap and water prior to dressing change. Wound Cleanser Discharge Instruction: Cleanse the wound with wound cleanser prior to applying a clean dressing using gauze sponges, not tissue or cotton balls. Peri-Wound Care Sween Lotion (Moisturizing lotion) Discharge Instruction: Apply moisturizing lotion as directed Topical Mupirocin Ointment Discharge Instruction: Apply Mupirocin (Bactroban) as instructed Primary Dressing KerraCel Ag Gelling Fiber Dressing, 4x5 in (silver alginate) Discharge Instruction: Apply silver alginate to wound bed as instructed Secondary Dressing ABD Pad, 5x9 Discharge Instruction: Apply over primary dressing as directed. Woven Gauze Sponge, Non-Sterile 4x4 in Discharge Instruction: Apply over primary dressing as directed. Secured With Compression Wrap FourPress (4 layer compression wrap) Discharge Instruction: Apply four layer compression as directed. May also use Miliken CoFlex 2 layer compression system as alternative. Compression Stockings Add-Ons Electronic Signature(s) Signed: 03/12/2022 7:35:29 PM By: Blanche East RN Entered By: Blanche East on 03/11/2022 08:03:44 -------------------------------------------------------------------------------- Wound Assessment Details Patient Name: Date of Service: Marvin Atlas, PA  UL J. 03/11/2022 8:00 A M Medical Record Number: [id number] Patient Account Number: [id number] Date of Birth/Sex: Treating RN: 02-Apr-1961 (61 y.o. [name] Primary Care Evian Salguero: Evonnie Dawes, Jenny Reichmann Other Clinician: Referring Chanan Detwiler: Treating Birgitta Uhlir/Extender: Evette Georges in Treatment: 5 Wound Status Wound Number: 7 Primary Venous Leg Ulcer Etiology: Wound Location: Right, Lateral Ankle Wound Open Wounding Event: Gradually Appeared Status: Date Acquired: 01/11/2022 Comorbid Anemia, Sleep Apnea, Congestive Heart Failure, Coronary Artery Weeks Of Treatment: 5 History: Disease, Peripheral Venous Disease, Osteoarthritis Clustered Wound: No Photos DAREK, EIFLER ([id number]) [id number] Page 8 of 10 Wound Measurements Length: (cm) 0.5 Width: (cm) 0.3 Depth: (cm) 0.1 Area: (cm) 0.118 Volume: (cm) 0.012 % Reduction in Area: 94.4% % Reduction in Volume: 97.2% Epithelialization: Medium (34-66%) Tunneling: No Undermining: No Wound Description Classification: Full Thickness With Exposed Support Structures Wound Margin: Distinct, outline attached Exudate Amount: Medium Exudate Type: Serosanguineous Exudate Color: red, brown Foul Odor After Cleansing: No Slough/Fibrino Yes Wound Bed Granulation Amount: Medium (34-66%) Exposed Structure Granulation Quality: Red, Pink Fascia Exposed: No Necrotic Amount: Medium (34-66%) Fat Layer (Subcutaneous Tissue) Exposed: Yes Necrotic Quality: Adherent Slough Tendon Exposed: No Muscle Exposed: No Joint Exposed: No Bone Exposed: No Periwound Skin Texture Texture Color No Abnormalities Noted: No No Abnormalities Noted: No Callus: No Atrophie Blanche: No Crepitus: No Cyanosis: No Excoriation: No Ecchymosis: No Induration: No Erythema: No Rash: No Hemosiderin Staining: No Scarring: No Mottled: No Pallor: No Moisture Rubor: No No Abnormalities Noted: No Dry / Scaly: No  Temperature / Pain Maceration: No Temperature: No Abnormality Tenderness on Palpation: Yes Treatment Notes Wound #7 (Ankle) Wound Laterality: Right, Lateral Cleanser Soap and Water Discharge Instruction: May shower and wash wound with dial antibacterial soap and water prior to dressing change. Wound Cleanser Discharge Instruction: Cleanse the wound with wound cleanser prior to applying a clean dressing using gauze sponges, not tissue or cotton balls. Peri-Wound Care Sween Lotion (Moisturizing lotion) Discharge Instruction: Apply moisturizing lotion as directed Topical Mupirocin Ointment Discharge Instruction: Apply Mupirocin (Bactroban) as instructed Primary Dressing KerraCel Ag Gelling Fiber Dressing, 4x5 in (silver alginate) Discharge Instruction: Apply silver alginate to wound bed as instructed CIEL, YANES ([id number]) [id number] Page 9 of  10 Secondary Dressing ABD Pad, 5x9 Discharge Instruction: Apply over primary dressing as directed. Woven Gauze Sponge, Non-Sterile 4x4 in Discharge Instruction: Apply over primary dressing as directed. Secured With Compression Wrap FourPress (4 layer compression wrap) Discharge Instruction: Apply four layer compression as directed. May also use Miliken CoFlex 2 layer compression system as alternative. Compression Stockings Add-Ons Electronic Signature(s) Signed: 03/12/2022 7:35:29 PM By: Blanche East RN Entered By: Blanche East on 03/11/2022 08:04:23 -------------------------------------------------------------------------------- Wound Assessment Details Patient Name: Date of Service: Marvin Atlas, PA UL J. 03/11/2022 8:00 A M Medical Record Number: [id number] Patient Account Number: [id number] Date of Birth/Sex: Treating RN: 1961-02-17 (61 y.o. [name] Primary Care Drew Herman: Evonnie Dawes, Jenny Reichmann Other Clinician: Referring Kashis Penley: Treating Braelyn Jenson/Extender: Evette Georges in  Treatment: 5 Wound Status Wound Number: 8 Primary Venous Leg Ulcer Etiology: Wound Location: Right, Posterior Lower Leg Wound Open Wounding Event: Gradually Appeared Status: Date Acquired: 01/11/2021 Comorbid Anemia, Sleep Apnea, Congestive Heart Failure, Coronary Artery Weeks Of Treatment: 4 History: Disease, Peripheral Venous Disease, Osteoarthritis Clustered Wound: No Photos Wound Measurements Length: (cm) Width: (cm) Depth: (cm) Area: (cm) Volume: (cm) 0 % Reduction in Area: 100% 0 % Reduction in Volume: 100% 0 Epithelialization: Small (1-33%) 0 Tunneling: No 0 Undermining: No Wound Description Classification: Full Thickness Without Exposed Support Structures Wound Margin: Distinct, outline attached Exudate Amount: None Present Foul Odor After Cleansing: No Slough/Fibrino No Wound Bed Granulation Amount: Large (67-100%) Exposed Structure Granulation Quality: Red Fascia Exposed: No DEQUANE, STRAHAN ([id number]) [id number] Page 10 of 10 Necrotic Amount: Small (1-33%) Fat Layer (Subcutaneous Tissue) Exposed: Yes Necrotic Quality: Adherent Slough Tendon Exposed: No Muscle Exposed: No Joint Exposed: No Bone Exposed: No Periwound Skin Texture Texture Color No Abnormalities Noted: Yes No Abnormalities Noted: No Hemosiderin Staining: Yes Moisture No Abnormalities Noted: No Temperature / Pain Dry / Scaly: No Temperature: No Abnormality Maceration: No Tenderness on Palpation: Yes Electronic Signature(s) Signed: 03/12/2022 7:35:29 PM By: Blanche East RN Entered By: Blanche East on 03/11/2022 08:20:45 -------------------------------------------------------------------------------- Vitals Details Patient Name: Date of Service: Marvin Atlas, PA UL J. 03/11/2022 8:00 A M Medical Record Number: [id number] Patient Account Number: [id number] Date of Birth/Sex: Treating RN: [birth year] (61 y.o. [name] Primary Care Modesta Sammons: Evonnie Dawes, Jenny Reichmann  Other Clinician: Referring Sherran Margolis: Treating Teonia Yager/Extender: Evette Georges in Treatment: 5 Vital Signs Time Taken: 07:50 Temperature (F): 978 Height (in): 71 Pulse (bpm): 61 Weight (lbs): 230 Respiratory Rate (breaths/min): 18 Body Mass Index (BMI): 32.1 Blood Pressure (mmHg): 107/69 Reference Range: 80 - 120 mg / dl Electronic Signature(s) Signed: 03/12/2022 7:35:29 PM By: Blanche East RN Entered By: Blanche East on 03/11/2022 07:53:46

## 2022-03-16 NOTE — Telephone Encounter (Signed)
Callback - I already routed FYI to patient's orthopedic doctor that final clearance is pending echo and upcoming visit with Dr. Agustin Cree, but it looks like original caller on this message chain was Heidi from [facility] calling - can you please notify them of this update as well? Do not see a fax number to send to. Thank you.

## 2022-03-16 NOTE — Telephone Encounter (Addendum)
   Patient Name: [name]  DOB: May 02, 1960 MRN: [id number]  Primary Cardiologist: Dr. Agustin Cree   Chart reviewed as part of pre-operative protocol coverage. Patient was already seen 12/23/21 by Dr. Agustin Cree for pre-op evaluation at which time he recommended coronary CTA scan for further evaluation. 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 Patient has a subsequent echocardiogram pending as well as follow-up visit scheduled 03/24/22. Per office protocol, the provider who saw the patient should forward their finalized clearance decision to requesting party below upon completion of testing. I will route this message to Dr. Agustin Cree so they are aware of where to fax final recommendation to.  Will route this message to surgeon as FYI. Will remove this message from the pre-op box as separate preop APP input is not needed.  Charlie Pitter, PA-C 03/16/2022, 8:50 AM

## 2022-03-17 NOTE — Telephone Encounter (Signed)
Fax [phone] for [facility] Physician, See notes from Melina Copa, [facility]

## 2022-03-18 ENCOUNTER — Encounter (HOSPITAL_BASED_OUTPATIENT_CLINIC_OR_DEPARTMENT_OTHER): Payer: PPO | Attending: General Surgery | Admitting: General Surgery

## 2022-03-18 DIAGNOSIS — L97512 Non-pressure chronic ulcer of other part of right foot with fat layer exposed: Secondary | ICD-10-CM | POA: Insufficient documentation

## 2022-03-18 DIAGNOSIS — L97312 Non-pressure chronic ulcer of right ankle with fat layer exposed: Secondary | ICD-10-CM | POA: Insufficient documentation

## 2022-03-18 DIAGNOSIS — I87331 Chronic venous hypertension (idiopathic) with ulcer and inflammation of right lower extremity: Secondary | ICD-10-CM | POA: Diagnosis not present

## 2022-03-18 DIAGNOSIS — I872 Venous insufficiency (chronic) (peripheral): Secondary | ICD-10-CM | POA: Diagnosis not present

## 2022-03-19 NOTE — Progress Notes (Signed)
YUSSEF, JORGE ([id number]) [id number] Page 1 of 10 Visit Report for 03/18/2022 Arrival Information Details Patient Name: Date of Service: [name], [place] J. 03/18/2022 8:30 A M Medical Record Number: [id number] Patient Account Number: [id number] Date of Birth/Sex: Treating RN: January 05, 1961 (61 y.o. [name] Primary Care Omaira Mellen: Evonnie Dawes, Jenny Reichmann Other Clinician: Referring Valery Amedee: Treating Paolina Karwowski/Extender: Evette Georges in Treatment: 6 Visit Information History Since Last Visit Added or deleted any medications: No Patient Arrived: [name] Any new allergies or adverse reactions: No Arrival Time: 08:45 Had a fall or experienced change in No Accompanied By: self activities of daily living that may affect Transfer Assistance: None risk of falls: Patient Identification Verified: Yes Signs or symptoms of abuse/neglect since last visito No Secondary Verification Process Completed: Yes Hospitalized since last visit: No Patient Requires Transmission-Based Precautions: No Implantable device outside of the clinic excluding No Patient Has Alerts: No cellular tissue based products placed in the center since last visit: Has Dressing in Place as Prescribed: Yes Has Compression in Place as Prescribed: Yes Pain Present Now: No Electronic Signature(s) Signed: 03/18/2022 3:51:07 PM By: Adline Peals Entered By: Adline Peals on 03/18/2022 08:50:45 -------------------------------------------------------------------------------- Compression Therapy Details Patient Name: Date of Service: Marvin Atlas, Marvin UL J. 03/18/2022 8:30 A M Medical Record Number: [id number] Patient Account Number: [id number] Date of Birth/Sex: Treating RN: 1960-09-02 (61 y.o. [name] Primary Care Cathlin Buchan: Evonnie Dawes, Jenny Reichmann Other Clinician: Referring Justis Closser: Treating Anyah Swallow/Extender: Evette Georges in Treatment:  6 Compression Therapy Performed for Wound Assessment: Wound #7 Right,Lateral Ankle Performed By: Clinician Adline Peals, RN Compression Type: Double Layer Post Procedure Diagnosis Same as Pre-procedure Electronic Signature(s) Signed: 03/18/2022 3:51:07 PM By: Sabas Sous By: Adline Peals on 03/18/2022 10:42:41 [name] ([id number]) [id number] Page 2 of 10 -------------------------------------------------------------------------------- Encounter Discharge Information Details Patient Name: Date of Service: [name], [place] J. 03/18/2022 8:30 A M Medical Record Number: [id number] Patient Account Number: [id number] Date of Birth/Sex: Treating RN: 1960/10/24 (61 y.o. [name] Primary Care Jezreel Sisk: Evonnie Dawes, Jenny Reichmann Other Clinician: Referring Devere Brem: Treating Kamyla Olejnik/Extender: Evette Georges in Treatment: 6 Encounter Discharge Information Items Discharge Condition: Stable Ambulatory Status: Cane Discharge Destination: Home Transportation: Private Auto Accompanied By: self Schedule Follow-up Appointment: Yes Clinical Summary of Care: Patient Declined Electronic Signature(s) Signed: 03/18/2022 3:51:07 PM By: Sabas Sous By: Adline Peals on 03/18/2022 09:07:38 -------------------------------------------------------------------------------- Lower Extremity Assessment Details Patient Name: Date of Service: Marvin Atlas, Marvin UL J. 03/18/2022 8:30 A M Medical Record Number: [id number] Patient Account Number: [id number] Date of Birth/Sex: Treating RN: 01/04/1961 (61 y.o. [name] Primary Care Abaigeal Moomaw: Evonnie Dawes, Jenny Reichmann Other Clinician: Referring Strummer Canipe: Treating Graycen Degan/Extender: Evette Georges in Treatment: 6 Edema Assessment Assessed: [Left: No] [Right: No] Edema: [Left: Ye] [Right: s] Calf Left: Right: Point of Measurement: 42.5 cm From  Medial Instep 44.5 cm Ankle Left: Right: Point of Measurement: 11.5 cm From Medial Instep 25 cm Vascular Assessment Pulses: Dorsalis Pedis Palpable: [Right:Yes] Electronic Signature(s) Signed: 03/18/2022 3:51:07 PM By: Adline Peals Entered By: Adline Peals on 03/18/2022 08:56:19 Multi Wound Chart Details -------------------------------------------------------------------------------- [name] ([id number]) [id number] Page 3 of 10 Patient Name: Date of Service: [name], [place] J. 03/18/2022 8:30 A M Medical Record Number: [id number] Patient Account Number: [id number] Date of Birth/Sex: Treating RN: 1960/08/01 (61 y.o. M) Primary Care Celia Friedland: Evonnie Dawes, Jenny Reichmann Other Clinician: Referring Shikara Mcauliffe: Treating Seanpatrick Maisano/Extender: Justice Britain,  John Weeks in Treatment: 6 Vital Signs Height(in): 71 Pulse(bpm): 46 Weight(lbs): 230 Blood Pressure(mmHg): 119/68 Body Mass Index(BMI): 32.1 Temperature(F): 97.9 Respiratory Rate(breaths/min): 18 Wound Assessments Wound Number: 6 7 N/A Photos: N/A Right T - Web between 1st and 2nd Right, Lateral Ankle oe N/A Wound Location: Gradually Appeared Gradually Appeared N/A Wounding Event: Lymphedema Venous Leg Ulcer N/A Primary Etiology: Anemia, Sleep Apnea, Congestive Anemia, Sleep Apnea, Congestive N/A Comorbid History: Heart Failure, Coronary Artery Heart Failure, Coronary Artery Disease, Peripheral Venous Disease, Disease, Peripheral Venous Disease, Osteoarthritis Osteoarthritis 01/11/2022 01/11/2022 N/A Date Acquired: 6 6 N/A Weeks of Treatment: Open Open N/A Wound Status: No No N/A Wound Recurrence: 0.3x0.3x0.1 0.3x0.3x0.1 N/A Measurements L x W x D (cm) 0.071 0.071 N/A A (cm) : rea 0.007 0.007 N/A Volume (cm) : 98.90% 96.70% N/A % Reduction in A rea: 99.40% 98.30% N/A % Reduction in Volume: Full Thickness With Exposed Support Full Thickness With Exposed Support  N/A Classification: Structures Structures Medium Medium N/A Exudate A mount: Serosanguineous Serosanguineous N/A Exudate Type: red, brown red, brown N/A Exudate Color: Distinct, outline attached Distinct, outline attached N/A Wound Margin: Large (67-100%) Large (67-100%) N/A Granulation A mount: Pink Red, Pink N/A Granulation Quality: Small (1-33%) Small (1-33%) N/A Necrotic A mount: Adherent Slough Eschar, Adherent Slough N/A Necrotic Tissue: Fat Layer (Subcutaneous Tissue): Yes Fat Layer (Subcutaneous Tissue): Yes N/A Exposed Structures: Fascia: No Fascia: No Tendon: No Tendon: No Muscle: No Muscle: No Joint: No Joint: No Bone: No Bone: No Large (67-100%) Medium (34-66%) N/A Epithelialization: N/A Debridement - Selective/Open Wound N/A Debridement: Pre-procedure Verification/Time Out N/A 09:03 N/A Taken: N/A Lidocaine 4% Topical Solution N/A Pain Control: N/A Necrotic/Eschar, Slough N/A Tissue Debrided: N/A Non-Viable Tissue N/A Level: N/A 0.09 N/A Debridement A (sq cm): rea N/A Curette N/A Instrument: N/A Minimum N/A Bleeding: N/A Pressure N/A Hemostasis A chieved: N/A Procedure was tolerated well N/A Debridement Treatment Response: N/A 0.3x0.3x0.1 N/A Post Debridement Measurements L x W x D (cm) N/A 0.007 N/A Post Debridement Volume: (cm) Excoriation: No Excoriation: No N/A Periwound Skin Texture: Induration: No Induration: No Callus: No Callus: No Crepitus: No Crepitus: No Rash: No Rash: No Scarring: No Scarring: No Maceration: Yes Maceration: No N/A Periwound Skin Moisture: Dry/Scaly: No Dry/Scaly: No Atrophie Blanche: No Atrophie Blanche: No N/A Periwound Skin Color: Cyanosis: No Cyanosis: No [name], [name] ([id number]) [id number] Page 4 of 10 Ecchymosis: No Ecchymosis: No Erythema: No Erythema: No Hemosiderin Staining: No Hemosiderin Staining: No Mottled: No Mottled: No Pallor: No Pallor:  No Rubor: No Rubor: No No Abnormality No Abnormality N/A Temperature: Yes Yes N/A Tenderness on Palpation: N/A Debridement N/A Procedures Performed: Treatment Notes Wound #6 (Toe - Web between 1st and 2nd) Wound Laterality: Right Cleanser Soap and Water Discharge Instruction: May shower and wash wound with dial antibacterial soap and water prior to dressing change. Wound Cleanser Discharge Instruction: Cleanse the wound with wound cleanser prior to applying a clean dressing using gauze sponges, not tissue or cotton balls. Peri-Wound Care Sween Lotion (Moisturizing lotion) Discharge Instruction: Apply moisturizing lotion as directed Topical Mupirocin Ointment Discharge Instruction: Apply Mupirocin (Bactroban) as instructed Primary Dressing Sorbalgon AG Dressing 2x2 (in/in) Discharge Instruction: Apply to wound bed as instructed Secondary Dressing ABD Pad, 5x9 Discharge Instruction: Apply over primary dressing as directed. Woven Gauze Sponge, Non-Sterile 4x4 in Discharge Instruction: Apply over primary dressing as directed. Secured With Compression Wrap FourPress (4 layer compression wrap) Discharge Instruction: Apply four layer compression as directed. May also use Miliken CoFlex 2 layer  compression system as alternative. Compression Stockings Add-Ons Wound #7 (Ankle) Wound Laterality: Right, Lateral Cleanser Soap and Water Discharge Instruction: May shower and wash wound with dial antibacterial soap and water prior to dressing change. Wound Cleanser Discharge Instruction: Cleanse the wound with wound cleanser prior to applying a clean dressing using gauze sponges, not tissue or cotton balls. Peri-Wound Care Sween Lotion (Moisturizing lotion) Discharge Instruction: Apply moisturizing lotion as directed Topical Mupirocin Ointment Discharge Instruction: Apply Mupirocin (Bactroban) as instructed Primary Dressing Sorbalgon AG Dressing 2x2 (in/in) Discharge Instruction:  Apply to wound bed as instructed Secondary Dressing ABD Pad, 5x9 Discharge Instruction: Apply over primary dressing as directed. Woven Gauze Sponge, Non-Sterile 4x4 in Discharge Instruction: Apply over primary dressing as directed. [name], [name] ([id number]) [id number] Page 5 of 10 Secured With Compression Wrap FourPress (4 layer compression wrap) Discharge Instruction: Apply four layer compression as directed. May also use Miliken CoFlex 2 layer compression system as alternative. Compression Stockings Add-Ons Electronic Signature(s) Signed: 03/18/2022 10:15:24 AM By: Fredirick Maudlin MD FACS Entered By: Fredirick Maudlin on 03/18/2022 10:15:24 -------------------------------------------------------------------------------- Multi-Disciplinary Care Plan Details Patient Name: Date of Service: Marvin Atlas, Marvin UL J. 03/18/2022 8:30 A M Medical Record Number: [id number] Patient Account Number: [id number] Date of Birth/Sex: Treating RN: June 15, 1960 (61 y.o. [name] Primary Care Shamila Lerch: Evonnie Dawes, Jenny Reichmann Other Clinician: Referring Tyshun Tuckerman: Treating Shakiara Lukic/Extender: Evette Georges in Treatment: 6 Multidisciplinary Care Plan reviewed with physician Active Inactive Venous Leg Ulcer Nursing Diagnoses: Actual venous Insuffiency (use after diagnosis is confirmed) Knowledge deficit related to disease process and management Potential for venous Insuffiency (use before diagnosis confirmed) Goals: Non-invasive venous studies are completed as ordered Date Initiated: 02/03/2022 Target Resolution Date: 05/15/2022 Goal Status: Active Patient will maintain optimal edema control Date Initiated: 02/03/2022 Target Resolution Date: 05/15/2022 Goal Status: Active Patient/caregiver will verbalize understanding of disease process and disease management Date Initiated: 02/03/2022 Date Inactivated: 02/17/2022 Target Resolution Date: 02/17/2022 Goal  Status: Met Interventions: Assess peripheral edema status every visit. Compression as ordered Provide education on venous insufficiency Notes: Wound/Skin Impairment Nursing Diagnoses: Impaired tissue integrity Knowledge deficit related to ulceration/compromised skin integrity Goals: Patient will have a decrease in wound volume by X% from date: (specify in notes) Date Initiated: 02/03/2022 Target Resolution Date: 05/15/2022 Goal Status: Active Patient/caregiver will verbalize understanding of skin care regimen Date Initiated: 02/03/2022 Date Inactivated: 03/18/2022 Target Resolution Date: 03/12/2022 Goal Status: Met [name], [name] ([id number]) [id number] Page 6 of 10 Ulcer/skin breakdown will have a volume reduction of 30% by week 4 Date Initiated: 02/03/2022 Date Inactivated: 03/11/2022 Target Resolution Date: 02/26/2022 Goal Status: Met Ulcer/skin breakdown will have a volume reduction of 50% by week 8 Date Initiated: 03/11/2022 Target Resolution Date: 05/15/2022 Goal Status: Active Interventions: Assess patient/caregiver ability to obtain necessary supplies Assess patient/caregiver ability to perform ulcer/skin care regimen upon admission and as needed Assess ulceration(s) every visit Notes: Electronic Signature(s) Signed: 03/18/2022 3:51:07 PM By: Adline Peals Entered By: Adline Peals on 03/18/2022 09:06:40 -------------------------------------------------------------------------------- Pain Assessment Details Patient Name: Date of Service: Marvin Atlas, Marvin UL J. 03/18/2022 8:30 A M Medical Record Number: [id number] Patient Account Number: [id number] Date of Birth/Sex: Treating RN: 1961/01/31 (61 y.o. [name] Primary Care Odester Nilson: Evonnie Dawes, Jenny Reichmann Other Clinician: Referring Katira Dumais: Treating Kevion Fatheree/Extender: Evette Georges in Treatment: 6 Active Problems Location of Pain Severity and Description of  Pain Patient Has Paino No Site Locations Rate the pain. Current Pain Level: 0 Pain Management and Medication Current Pain  Management: Electronic Signature(s) Signed: 03/18/2022 3:51:07 PM By: Adline Peals Entered By: Adline Peals on 03/18/2022 08:51:10 [name] ([id number]) [id number] Page 7 of 10 -------------------------------------------------------------------------------- Patient/Caregiver Education Details Patient Name: Date of Service: [name], [place] 12/6/2023andnbsp8:30 A M Medical Record Number: [id number] Patient Account Number: [id number] Date of Birth/Gender: Treating RN: 1960-07-09 (61 y.o. [name] Primary Care Physician: Evonnie Dawes, Jenny Reichmann Other Clinician: Referring Physician: Treating Physician/Extender: Evette Georges in Treatment: 6 Education Assessment Education Provided To: Patient Education Topics Provided Wound/Skin Impairment: Methods: Explain/Verbal Responses: Reinforcements needed, State content correctly Electronic Signature(s) Signed: 03/18/2022 3:51:07 PM By: Adline Peals Entered By: Adline Peals on 03/18/2022 09:06:52 -------------------------------------------------------------------------------- Wound Assessment Details Patient Name: Date of Service: Marvin Atlas, Marvin UL J. 03/18/2022 8:30 A M Medical Record Number: [id number] Patient Account Number: [id number] Date of Birth/Sex: Treating RN: 07/13/1960 (61 y.o. [name] Primary Care Malaney Mcbean: Evonnie Dawes, John Other Clinician: Referring Shemicka Cohrs: Treating Bentli Llorente/Extender: Evette Georges in Treatment: 6 Wound Status Wound Number: 6 Primary Lymphedema Etiology: Wound Location: Right T - Web between 1st and 2nd oe Wound Open Wounding Event: Gradually Appeared Status: Date Acquired: 01/11/2022 Comorbid Anemia, Sleep Apnea, Congestive Heart Failure, Coronary Artery Weeks  Of Treatment: 6 History: Disease, Peripheral Venous Disease, Osteoarthritis Clustered Wound: No Photos Wound Measurements Length: (cm) 0.3 Width: (cm) 0.3 Depth: (cm) 0.1 Area: (cm) 0.071 Volume: (cm) 0.007 % Reduction in Area: 98.9% % Reduction in Volume: 99.4% Epithelialization: Large (67-100%) Tunneling: No Undermining: No Wound Description [name], [name] ([id number]) Classification: Full Thickness With Exposed Support Structures Wound Margin: Distinct, outline attached Exudate Amount: Medium Exudate Type: Serosanguineous Exudate Color: red, brown [id number] Page 8 of 10 Foul Odor After Cleansing: No Slough/Fibrino Yes Wound Bed Granulation Amount: Large (67-100%) Exposed Structure Granulation Quality: Pink Fascia Exposed: No Necrotic Amount: Small (1-33%) Fat Layer (Subcutaneous Tissue) Exposed: Yes Necrotic Quality: Adherent Slough Tendon Exposed: No Muscle Exposed: No Joint Exposed: No Bone Exposed: No Periwound Skin Texture Texture Color No Abnormalities Noted: No No Abnormalities Noted: No Callus: No Atrophie Blanche: No Crepitus: No Cyanosis: No Excoriation: No Ecchymosis: No Induration: No Erythema: No Rash: No Hemosiderin Staining: No Scarring: No Mottled: No Pallor: No Moisture Rubor: No No Abnormalities Noted: No Dry / Scaly: No Temperature / Pain Maceration: Yes Temperature: No Abnormality Tenderness on Palpation: Yes Treatment Notes Wound #6 (Toe - Web between 1st and 2nd) Wound Laterality: Right Cleanser Soap and Water Discharge Instruction: May shower and wash wound with dial antibacterial soap and water prior to dressing change. Wound Cleanser Discharge Instruction: Cleanse the wound with wound cleanser prior to applying a clean dressing using gauze sponges, not tissue or cotton balls. Peri-Wound Care Sween Lotion (Moisturizing lotion) Discharge Instruction: Apply moisturizing lotion as  directed Topical Mupirocin Ointment Discharge Instruction: Apply Mupirocin (Bactroban) as instructed Primary Dressing Sorbalgon AG Dressing 2x2 (in/in) Discharge Instruction: Apply to wound bed as instructed Secondary Dressing ABD Pad, 5x9 Discharge Instruction: Apply over primary dressing as directed. Woven Gauze Sponge, Non-Sterile 4x4 in Discharge Instruction: Apply over primary dressing as directed. Secured With Compression Wrap FourPress (4 layer compression wrap) Discharge Instruction: Apply four layer compression as directed. May also use Miliken CoFlex 2 layer compression system as alternative. Compression Stockings Add-Ons Electronic Signature(s) Signed: 03/18/2022 3:51:07 PM By: Adline Peals Entered By: Adline Peals on 03/18/2022 08:59:06 [name] ([id number]) [id number] Page 9 of 10 -------------------------------------------------------------------------------- Wound Assessment Details Patient Name: Date of Service: [name], [place]  UL J. 03/18/2022 8:30 A M Medical Record Number: [id number] Patient Account Number: [id number] Date of Birth/Sex: Treating RN: 03-29-1961 (61 y.o. [name] Primary Care Wanita Derenzo: Evonnie Dawes, Jenny Reichmann Other Clinician: Referring Jaedin Regina: Treating Johnaton Sonneborn/Extender: Evette Georges in Treatment: 6 Wound Status Wound Number: 7 Primary Venous Leg Ulcer Etiology: Wound Location: Right, Lateral Ankle Wound Open Wounding Event: Gradually Appeared Status: Date Acquired: 01/11/2022 Comorbid Anemia, Sleep Apnea, Congestive Heart Failure, Coronary Artery Weeks Of Treatment: 6 History: Disease, Peripheral Venous Disease, Osteoarthritis Clustered Wound: No Photos Wound Measurements Length: (cm) 0.3 Width: (cm) 0.3 Depth: (cm) 0.1 Area: (cm) 0.071 Volume: (cm) 0.007 % Reduction in Area: 96.7% % Reduction in Volume: 98.3% Epithelialization: Medium (34-66%) Tunneling:  No Undermining: No Wound Description Classification: Full Thickness With Exposed Suppo Wound Margin: Distinct, outline attached Exudate Amount: Medium Exudate Type: Serosanguineous Exudate Color: red, brown rt Structures Foul Odor After Cleansing: No Slough/Fibrino Yes Wound Bed Granulation Amount: Large (67-100%) Exposed Structure Granulation Quality: Red, Pink Fascia Exposed: No Necrotic Amount: Small (1-33%) Fat Layer (Subcutaneous Tissue) Exposed: Yes Necrotic Quality: Eschar, Adherent Slough Tendon Exposed: No Muscle Exposed: No Joint Exposed: No Bone Exposed: No Periwound Skin Texture Texture Color No Abnormalities Noted: No No Abnormalities Noted: No Callus: No Atrophie Blanche: No Crepitus: No Cyanosis: No Excoriation: No Ecchymosis: No Induration: No Erythema: No Rash: No Hemosiderin Staining: No Scarring: No Mottled: No Pallor: No Moisture Rubor: No No Abnormalities Noted: No Dry / Scaly: No Temperature / Pain Maceration: No Temperature: No Abnormality Tenderness on PalpationLYNTON, CRESCENZO ([id number]) [id number] Page 10 of 10 Treatment Notes Wound #7 (Ankle) Wound Laterality: Right, Lateral Cleanser Soap and Water Discharge Instruction: May shower and wash wound with dial antibacterial soap and water prior to dressing change. Wound Cleanser Discharge Instruction: Cleanse the wound with wound cleanser prior to applying a clean dressing using gauze sponges, not tissue or cotton balls. Peri-Wound Care Sween Lotion (Moisturizing lotion) Discharge Instruction: Apply moisturizing lotion as directed Topical Mupirocin Ointment Discharge Instruction: Apply Mupirocin (Bactroban) as instructed Primary Dressing Sorbalgon AG Dressing 2x2 (in/in) Discharge Instruction: Apply to wound bed as instructed Secondary Dressing ABD Pad, 5x9 Discharge Instruction: Apply over primary dressing as directed. Woven Gauze Sponge,  Non-Sterile 4x4 in Discharge Instruction: Apply over primary dressing as directed. Secured With Compression Wrap FourPress (4 layer compression wrap) Discharge Instruction: Apply four layer compression as directed. May also use Miliken CoFlex 2 layer compression system as alternative. Compression Stockings Add-Ons Electronic Signature(s) Signed: 03/18/2022 3:51:07 PM By: Adline Peals Entered By: Adline Peals on 03/18/2022 08:59:33 -------------------------------------------------------------------------------- Vitals Details Patient Name: Date of Service: Marvin Atlas, Marvin UL J. 03/18/2022 8:30 A M Medical Record Number: [id number] Patient Account Number: [id number] Date of Birth/Sex: Treating RN: [birth year] (61 y.o. [name] Primary Care Mj Willis: Evonnie Dawes, Jenny Reichmann Other Clinician: Referring Edrees Valent: Treating Berlin Mokry/Extender: Evette Georges in Treatment: 6 Vital Signs Time Taken: 08:50 Temperature (F): 97.9 Height (in): 71 Pulse (bpm): 68 Weight (lbs): 230 Respiratory Rate (breaths/min): 18 Body Mass Index (BMI): 32.1 Blood Pressure (mmHg): 119/68 Reference Range: 80 - 120 mg / dl Electronic Signature(s) Signed: 03/18/2022 3:51:07 PM By: Adline Peals Entered By: Adline Peals on 03/18/2022 08:51:05

## 2022-03-19 NOTE — Progress Notes (Signed)
JODIE, LEINER ([id number]) [id number] Page 1 of 11 Visit Report for 03/18/2022 Chief Complaint Document Details Patient Name: Date of Service: [name], [place] J. 03/18/2022 8:30 A M Medical Record Number: [id number] Patient Account Number: [id number] Date of Birth/Sex: Treating RN: Apr 16, 1960 (61 y.o. M) Primary Care Provider: Evonnie Dawes, Jenny Reichmann Other Clinician: Referring Provider: Treating Provider/Extender: Evette Georges in Treatment: 6 Information Obtained from: Patient Chief Complaint 05/20/2020; patient is here for review of wounds on his bilateral lower legs in the setting of severe chronic venous insufficiency with lymphedema 02/03/2022: returns to clinic today with new wounds on RLE Electronic Signature(s) Signed: 03/18/2022 10:15:30 AM By: Fredirick Maudlin MD FACS Entered By: Fredirick Maudlin on 03/18/2022 10:15:30 -------------------------------------------------------------------------------- Debridement Details Patient Name: Date of Service: Marvin Atlas, PA UL J. 03/18/2022 8:30 A M Medical Record Number: [id number] Patient Account Number: [id number] Date of Birth/Sex: Treating RN: May 10, 1960 (61 y.o. [name] Primary Care Provider: Evonnie Dawes, Jenny Reichmann Other Clinician: Referring Provider: Treating Provider/Extender: Evette Georges in Treatment: 6 Debridement Performed for Assessment: Wound #7 Right,Lateral Ankle Performed By: Physician Fredirick Maudlin, MD Debridement Type: Debridement Severity of Tissue Pre Debridement: Fat layer exposed Level of Consciousness (Pre-procedure): Awake and Alert Pre-procedure Verification/Time Out Yes - 09:03 Taken: Start Time: 09:03 Pain Control: Lidocaine 4% T opical Solution T Area Debrided (L x W): otal 0.3 (cm) x 0.3 (cm) = 0.09 (cm) Tissue and other material debrided: Non-Viable, Eschar, Slough, Slough Level: Non-Viable Tissue Debridement Description:  Selective/Open Wound Instrument: Curette Bleeding: Minimum Hemostasis Achieved: Pressure Response to Treatment: Procedure was tolerated well Level of Consciousness (Post- Awake and Alert procedure): Post Debridement Measurements of Total Wound Length: (cm) 0.3 Width: (cm) 0.3 Depth: (cm) 0.1 Volume: (cm) 0.007 Character of Wound/Ulcer Post Debridement: Improved Severity of Tissue Post Debridement: Fat layer exposed LORI, LIEW ([id number]) [id number] Page 2 of 11 Post Procedure Diagnosis Same as Pre-procedure Notes scribed for Dr. Celine Ahr by Adline Peals, RN Electronic Signature(s) Signed: 03/18/2022 3:51:07 PM By: Adline Peals Signed: 03/18/2022 5:07:18 PM By: Fredirick Maudlin MD FACS Entered By: Adline Peals on 03/18/2022 09:07:28 -------------------------------------------------------------------------------- HPI Details Patient Name: Date of Service: Marvin Atlas, PA UL J. 03/18/2022 8:30 A M Medical Record Number: [id number] Patient Account Number: [id number] Date of Birth/Sex: Treating RN: 09-28-1960 (61 y.o. M) Primary Care Provider: Evonnie Dawes, Jenny Reichmann Other Clinician: Referring Provider: Treating Provider/Extender: Evette Georges in Treatment: 6 History of Present Illness HPI Description: ADMISSION 05/20/2020 This is a 61 year old man who is not a diabetic however he has a long history of chronic venous insufficiency with wounds on his lower extremities. He went to the wound care center in [place] for a long period of time with recurrent wounds in these areas treated with compression wraps. He comes in today with wounds on his bilateral lower legs that he says have been there about a year on the right and about 6 months on the left. He went to see Dr. Oneida Alar on 01/17/2020 who noted that he had no history of DVT but does have a history of venous insufficiency wounds. They noted that he was considering laser  ablation in October 2020 but he canceled the procedure. He had an episode of bleeding from varicose veins in 2019. He had repeated venous reflux study on 10/6 which showed no evidence of a DVT or SVT Noted that he had venous reflux in the right greater saphenous vein in the thigh and the calf  on the right. Also . noted to have venous reflux in the right femoral vein right popliteal vein and right small saphenous vein. At the time in October they wanted the wounds to heal a bit he also also had a right groin wound which is apparently closed. Not sure about the follow-up with vein and vascular. He comes in today with some light compression wraps and collagen on the wound placed by Dr. Lilia Pro who is a Education officer, environmental in [place]. He has 2 areas on the left leg 1 anteriorly and a large area posteriorly and a smaller area on the right anterior lower leg. He has marked lower extremity edema which is nonpitting. Hemosiderin deposition on both lower legs extensively. There is dilated venules in his feet. Past medical history includes history of opioid abuse in remission, left nephrolithiasis, history of an ablation although I did not see this in Dr. Oneida Alar notes, congestive heart failure and anemia ABIs in our clinic were noncompressible bilaterally 05/27/2020; we admitted this patient to clinic last week. He has bilateral superficial venous insufficiency with bilateral wounds left greater than right. We put him in 4 layer compression with silver alginate. The he was previously cared for in the [facility] wound care clinic when it was open. His wounds are better with compression this week and there is certainly less swelling. 2/21; bilateral superficial venous insufficiency wounds. A cluster on the left lateral. He has very well-defined areas on the right anterior right and right lateral. We are using silver alginate and 4-layer compression 2/28; bilateral superficial venous insufficiency wounds in the setting  of significant stage III lymphedema. He has a cluster on the left which is smaller. His area on the right anterior and right lateral also look better. 3/14 superficial bilateral venous insufficiency wounds. Much better on both sides in fact the area on the right posterior is healed. He still has 2 small open areas on the right anterior and the left lateral 3/21; superficial bilateral venous insufficiency wounds. Everything is closed on the right side. Small area still remains on the left. We will transition him today to his right extremiteaze stocking. Still dressing the remaining wound on the left with compression 3/28; the patient's right leg remains closed in his wraparound stocking. Disappointingly today the area on the left lateral leg looks about the same. I thought this might be closed with been using silver alginate 4/4; right leg remains in his wraparound stocking.. Left lateral leg measures smaller even under illumination the wound looks good. Changed him to [facility] with additional ABD compression last week 4/11; left lateral posterior very tiny superficial wound. This should be closed by next week. Using Hydrofera Blue. 4/18; left lateral wound is totally epithelialized. He has his own external compression stocking and compression pumps. READMISSION 02/03/2022 This is a 61 year old man well-known to the wound care center. He has chronic venous insufficiency and lymphedema. He has been evaluated in the past and has documented venous reflux, but thus far, has not undergone ablation. He returns to clinic today with multiple wounds on his right lower extremity. He reports that the medial lower leg wound started when something sharp stuck into his leg about a year ago. He says he was trying to treat this at home and took a course of cephalexin that he had leftover from a previous event. It did not improve and in the past 3 to 4 weeks, it has actually gotten worse, to the point that he  is unable to use  his lymphedema pumps secondary to pain. He has another ulcer on his left lateral ankle and one between his first and second toes, both present for about 3 to 4 weeks, per his report. On his right medial lower leg, there are 2 ulcers with slough accumulated. The surface underneath the slough is quite fibrous, consistent with his history of multiple lower leg ulcerations. He has a triangular wound on his right lateral ankle, similar in appearance to the others. Between his first and second toes, he has what appears to be athlete's foot, with a fungal odor and skin breakdown with eschar and slough present. 02/09/2022: On the right medial lower leg wound, there is significant slough accumulation. The right lateral ankle wound is gray and the patient reports an increase in pain in this site. The area between his first and second toes has dried out considerably. The more proximal ulcer on his right posterior leg has some NYRON, MOZER ([id number]) [id number] Page 3 of 11 slough buildup over a fibrotic surface. Edema control is markedly improved. 02/17/2022: The right medial lower leg and right lateral ankle wound still have a lot of slough accumulation, but they are less painful. The culture that I took last week grew out multiple gram-positive species. The area between his first and second toes still has a consistent appearance with athlete's foot. The more proximal lateral lower leg wound has healed. He still has not been scheduled for his repeat venous reflux studies. 02/25/2022: He had his repeat reflux studies done but for some reason they only did the right leg. He does have evidence of reflux. In the past, bilateral studies were performed and he has significant venous reflux in both legs. His edema control is excellent this week. The areas between his toes are drying up. The right medial lower leg wound is substantially smaller with just a little bit of  eschar overlying the open area. The lateral ankle wound still has some slough and eschar accumulation and remains fairly tender. 03/11/2022: The right medial leg wound has closed. The lateral ankle wound is substantially smaller with just some slough and eschar present. It is tender, but less so than on previous visits. He is still having some issues with moisture between his toes and athlete's foot. His wrap slipped and the area above the wrap is markedly swollen, well the areas that was still contained in the wrap has excellent edema control. 03/18/2022: The lateral ankle wound has contracted further with just slough and eschar present. He is not experiencing any pain in the location. His interdigital tinea pedis has responded well to over-the-counter antifungal spray powder. We had him in a zinc-based Coflex wrap and this stayed up well; edema control is much better this week. Electronic Signature(s) Signed: 03/18/2022 10:16:20 AM By: Fredirick Maudlin MD FACS Entered By: Fredirick Maudlin on 03/18/2022 10:16:20 -------------------------------------------------------------------------------- Physical Exam Details Patient Name: Date of Service: Marvin Atlas, PA UL J. 03/18/2022 8:30 A M Medical Record Number: [id number] Patient Account Number: [id number] Date of Birth/Sex: Treating RN: 1960-05-22 (61 y.o. M) Primary Care Provider: Evonnie Dawes, Jenny Reichmann Other Clinician: Referring Provider: Treating Provider/Extender: Evette Georges in Treatment: 6 Constitutional . . . . No acute distress. Respiratory Normal work of breathing on room air. Notes 03/18/2022: The lateral ankle wound has contracted further with just slough and eschar present. He is not experiencing any pain in the location. His interdigital tinea pedis has responded well to over-the-counter antifungal spray powder. We had him in  a zinc-based Coflex wrap and this stayed up well; edema control is much better this  week. Electronic Signature(s) Signed: 03/18/2022 10:16:51 AM By: Fredirick Maudlin MD FACS Entered By: Fredirick Maudlin on 03/18/2022 10:16:51 -------------------------------------------------------------------------------- Physician Orders Details Patient Name: Date of Service: Marvin Atlas, PA UL J. 03/18/2022 8:30 A M Medical Record Number: [id number] Patient Account Number: [id number] Date of Birth/Sex: Treating RN: 03/01/1961 (61 y.o. [name] Primary Care Provider: Evonnie Dawes, Jenny Reichmann Other Clinician: Referring Provider: Treating Provider/Extender: Evette Georges in Treatment: 6 Verbal / Phone Orders: No Diagnosis Coding ICD-10 Coding Code Description GREYDEN, BESECKER ([id number]) [id number] Page 4 of 11 I87.331 Chronic venous hypertension (idiopathic) with ulcer and inflammation of right lower extremity 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 Follow-up Appointments ppointment in 1 week. - Dr. Celine Ahr rm 4 Return A Other: - Vein and Vascular Speak with Damita Dunnings about venous studies phone [phone]. Anesthetic (In clinic) Topical Lidocaine 4% applied to wound bed - all wounds prior to debridement Bathing/ Shower/ Hygiene May shower with protection but do not get wound dressing(s) wet. Edema Control - Lymphedema / SCD / Other Lymphedema Pumps. Use Lymphedema pumps on leg(s) 2-3 times a day for 45-60 minutes. If wearing any wraps or hose, do not remove them. Continue exercising as instructed. Elevate legs to the level of the heart or above for 30 minutes daily and/or when sitting, a frequency of: Avoid standing for long periods of time. Exercise regularly Compression stocking or Garment 20-30 mm/Hg pressure to: - toleftleg daily Wound Treatment Wound #6 - T - Web between 1st and 2nd oe Wound Laterality:  Right Cleanser: Soap and Water 1 x Per Week/7 Days Discharge Instructions: May shower and wash wound with dial antibacterial soap and water prior to dressing change. Cleanser: Wound Cleanser 1 x Per Week/7 Days Discharge Instructions: Cleanse the wound with wound cleanser prior to applying a clean dressing using gauze sponges, not tissue or cotton balls. Topical: Antifungal spray powder 1 x Per Week/7 Days Wound #7 - Ankle Wound Laterality: Right, Lateral Cleanser: Soap and Water 1 x Per Week/7 Days Discharge Instructions: May shower and wash wound with dial antibacterial soap and water prior to dressing change. Cleanser: Wound Cleanser 1 x Per Week/7 Days Discharge Instructions: Cleanse the wound with wound cleanser prior to applying a clean dressing using gauze sponges, not tissue or cotton balls. Peri-Wound Care: Sween Lotion (Moisturizing lotion) 1 x Per Week/7 Days Discharge Instructions: Apply moisturizing lotion as directed Topical: Mupirocin Ointment 1 x Per Week/7 Days Discharge Instructions: Apply Mupirocin (Bactroban) as instructed Prim Dressing: Sorbalgon AG Dressing 2x2 (in/in) 1 x Per Week/7 Days ary Discharge Instructions: Apply to wound bed as instructed Secondary Dressing: ABD Pad, 5x9 1 x Per Week/7 Days Discharge Instructions: Apply over primary dressing as directed. Secondary Dressing: Woven Gauze Sponge, Non-Sterile 4x4 in 1 x Per Week/7 Days Discharge Instructions: Apply over primary dressing as directed. Compression Wrap: FourPress (4 layer compression wrap) 1 x Per Week/7 Days Discharge Instructions: Apply four layer compression as directed. May also use Miliken CoFlex 2 layer compression system as alternative. Patient Medications llergies: No Known Allergies A Notifications Medication Indication Start End 03/18/2022 lidocaine DOSE topical 4 % cream - cream topical Electronic Signature(s) Signed: 03/18/2022 5:07:18 PM By: Fredirick Maudlin MD FACS Entered By:  Fredirick Maudlin on 03/18/2022 10:17:41 Adah Salvage ([id number]) [id number] Page  5 of 11 -------------------------------------------------------------------------------- Problem List Details Patient Name: Date of Service: [name], [place] 03/18/2022 8:30 A M Medical Record Number: [id number] Patient Account Number: [id number] Date of Birth/Sex: Treating RN: January 07, 1961 (61 y.o. M) Primary Care Provider: Evonnie Dawes, Jenny Reichmann Other Clinician: Referring Provider: Treating Provider/Extender: Evette Georges in Treatment: 6 Active Problems ICD-10 Encounter Code Description Active Date MDM Diagnosis I87.331 Chronic venous hypertension (idiopathic) with ulcer and inflammation of right 02/03/2022 No Yes lower extremity L97.312 Non-pressure chronic ulcer of right ankle with fat layer exposed 02/03/2022 No Yes L97.512 Non-pressure chronic ulcer of other part of right foot with fat layer exposed 02/03/2022 No Yes Inactive Problems Resolved Problems ICD-10 Code Description Active Date Resolved Date L97.812 Non-pressure chronic ulcer of other part of right lower leg with fat layer exposed 02/03/2022 02/03/2022 Electronic Signature(s) Signed: 03/18/2022 10:15:17 AM By: Fredirick Maudlin MD FACS Entered By: Fredirick Maudlin on 03/18/2022 10:15:17 -------------------------------------------------------------------------------- Progress Note Details Patient Name: Date of Service: Marvin Atlas, PA UL J. 03/18/2022 8:30 A M Medical Record Number: [id number] Patient Account Number: [id number] Date of Birth/Sex: Treating RN: [birth year] (61 y.o. M) Primary Care Provider: Evonnie Dawes, Jenny Reichmann Other Clinician: Referring Provider: Treating Provider/Extender: Evette Georges in Treatment: 6 Subjective Chief Complaint Information obtained from Patient 05/20/2020; patient is here for review of wounds on his bilateral lower legs in the setting of  severe chronic venous insufficiency with lymphedema 02/03/2022: returns to clinic today with new wounds on RLE History of Present Illness (HPI) [place], Garin J ([id number]) [id number] Page 6 of 11 05/20/2020 This is a 61 year old man who is not a diabetic however he has a long history of chronic venous insufficiency with wounds on his lower extremities. He went to the wound care center in [place] for a long period of time with recurrent wounds in these areas treated with compression wraps. He comes in today with wounds on his bilateral lower legs that he says have been there about a year on the right and about 6 months on the left. He went to see Dr. Oneida Alar on 01/17/2020 who noted that he had no history of DVT but does have a history of venous insufficiency wounds. They noted that he was considering laser ablation in October 2020 but he canceled the procedure. He had an episode of bleeding from varicose veins in 2019. He had repeated venous reflux study on 10/6 which showed no evidence of a DVT or SVT Noted that he had venous reflux in the right greater saphenous vein in the thigh and the calf on the right. Also . noted to have venous reflux in the right femoral vein right popliteal vein and right small saphenous vein. At the time in October they wanted the wounds to heal a bit he also also had a right groin wound which is apparently closed. Not sure about the follow-up with vein and vascular. He comes in today with some light compression wraps and collagen on the wound placed by Dr. Lilia Pro who is a Education officer, environmental in [place]. He has 2 areas on the left leg 1 anteriorly and a large area posteriorly and a smaller area on the right anterior lower leg. He has marked lower extremity edema which is nonpitting. Hemosiderin deposition on both lower legs extensively. There is dilated venules in his feet. Past medical history includes history of opioid abuse in remission,  left nephrolithiasis, history of an ablation although I did not see this in Dr. Oneida Alar  notes, congestive heart failure and anemia ABIs in our clinic were noncompressible bilaterally 05/27/2020; we admitted this patient to clinic last week. He has bilateral superficial venous insufficiency with bilateral wounds left greater than right. We put him in 4 layer compression with silver alginate. The he was previously cared for in the [facility] wound care clinic when it was open. His wounds are better with compression this week and there is certainly less swelling. 2/21; bilateral superficial venous insufficiency wounds. A cluster on the left lateral. He has very well-defined areas on the right anterior right and right lateral. We are using silver alginate and 4-layer compression 2/28; bilateral superficial venous insufficiency wounds in the setting of significant stage III lymphedema. He has a cluster on the left which is smaller. His area on the right anterior and right lateral also look better. 3/14 superficial bilateral venous insufficiency wounds. Much better on both sides in fact the area on the right posterior is healed. He still has 2 small open areas on the right anterior and the left lateral 3/21; superficial bilateral venous insufficiency wounds. Everything is closed on the right side. Small area still remains on the left. We will transition him today to his right extremiteaze stocking. Still dressing the remaining wound on the left with compression 3/28; the patient's right leg remains closed in his wraparound stocking. Disappointingly today the area on the left lateral leg looks about the same. I thought this might be closed with been using silver alginate 4/4; right leg remains in his wraparound stocking.. Left lateral leg measures smaller even under illumination the wound looks good. Changed him to [facility] with additional ABD compression last week 4/11; left lateral posterior very tiny  superficial wound. This should be closed by next week. Using Hydrofera Blue. 4/18; left lateral wound is totally epithelialized. He has his own external compression stocking and compression pumps. READMISSION 02/03/2022 This is a 61 year old man well-known to the wound care center. He has chronic venous insufficiency and lymphedema. He has been evaluated in the past and has documented venous reflux, but thus far, has not undergone ablation. He returns to clinic today with multiple wounds on his right lower extremity. He reports that the medial lower leg wound started when something sharp stuck into his leg about a year ago. He says he was trying to treat this at home and took a course of cephalexin that he had leftover from a previous event. It did not improve and in the past 3 to 4 weeks, it has actually gotten worse, to the point that he is unable to use his lymphedema pumps secondary to pain. He has another ulcer on his left lateral ankle and one between his first and second toes, both present for about 3 to 4 weeks, per his report. On his right medial lower leg, there are 2 ulcers with slough accumulated. The surface underneath the slough is quite fibrous, consistent with his history of multiple lower leg ulcerations. He has a triangular wound on his right lateral ankle, similar in appearance to the others. Between his first and second toes, he has what appears to be athlete's foot, with a fungal odor and skin breakdown with eschar and slough present. 02/09/2022: On the right medial lower leg wound, there is significant slough accumulation. The right lateral ankle wound is gray and the patient reports an increase in pain in this site. The area between his first and second toes has dried out considerably. The more proximal ulcer on his right  posterior leg has some slough buildup over a fibrotic surface. Edema control is markedly improved. 02/17/2022: The right medial lower leg and right lateral  ankle wound still have a lot of slough accumulation, but they are less painful. The culture that I took last week grew out multiple gram-positive species. The area between his first and second toes still has a consistent appearance with athlete's foot. The more proximal lateral lower leg wound has healed. He still has not been scheduled for his repeat venous reflux studies. 02/25/2022: He had his repeat reflux studies done but for some reason they only did the right leg. He does have evidence of reflux. In the past, bilateral studies were performed and he has significant venous reflux in both legs. His edema control is excellent this week. The areas between his toes are drying up. The right medial lower leg wound is substantially smaller with just a little bit of eschar overlying the open area. The lateral ankle wound still has some slough and eschar accumulation and remains fairly tender. 03/11/2022: The right medial leg wound has closed. The lateral ankle wound is substantially smaller with just some slough and eschar present. It is tender, but less so than on previous visits. He is still having some issues with moisture between his toes and athlete's foot. His wrap slipped and the area above the wrap is markedly swollen, well the areas that was still contained in the wrap has excellent edema control. 03/18/2022: The lateral ankle wound has contracted further with just slough and eschar present. He is not experiencing any pain in the location. His interdigital tinea pedis has responded well to over-the-counter antifungal spray powder. We had him in a zinc-based Coflex wrap and this stayed up well; edema control is much better this week. Patient History Information obtained from Patient. Family History Cancer, Diabetes - Father, Hypertension - Father, No family history of Heart Disease, Hereditary Spherocytosis, Kidney Disease, Lung Disease, Seizures, Stroke, Thyroid Problems, Tuberculosis. Social  History Current every day smoker, Marital Status - Divorced, Alcohol Use - Never, Drug Use - Prior History, Caffeine Use - Rarely. Medical History Eyes Denies history of Cataracts, Glaucoma, Optic Neuritis Ear/Nose/Mouth/Throat Denies history of Chronic sinus problems/congestion, Middle ear problems Hematologic/Lymphatic Patient has history of Anemia Denies history of Hemophilia, Human Immunodeficiency Virus, Lymphedema, Sickle Cell Disease Respiratory Patient has history of Sleep Apnea Denies history of Aspiration, Asthma, Chronic Obstructive Pulmonary Disease (COPD), Pneumothorax, Tuberculosis ATTILIO, ZEITLER ([id number]) [id number] Page 7 of 11 Cardiovascular Patient has history of Congestive Heart Failure, Coronary Artery Disease, Peripheral Venous Disease Denies history of Angina, Arrhythmia, Deep Vein Thrombosis, Hypertension, Hypotension, Myocardial Infarction, Peripheral Arterial Disease, Phlebitis, Vasculitis Gastrointestinal Denies history of Cirrhosis , Colitis, Crohnoos, Hepatitis A, Hepatitis B, Hepatitis C Endocrine Denies history of Type I Diabetes, Type II Diabetes Genitourinary Denies history of End Stage Renal Disease Immunological Denies history of Lupus Erythematosus, Raynaudoos, Scleroderma Integumentary (Skin) Denies history of History of Burn Musculoskeletal Patient has history of Osteoarthritis Denies history of Gout, Rheumatoid Arthritis, Osteomyelitis Neurologic Denies history of Dementia, Neuropathy, Quadriplegia, Paraplegia, Seizure Disorder Oncologic Denies history of Received Chemotherapy, Received Radiation Psychiatric Denies history of Anorexia/bulimia, Confinement Anxiety Hospitalization/Surgery History - right hip nailing 10/17/2019. - 05/17/2018 nephrolithotomy left. - 01/29/2017 right lymph noes biopsy. - cystoscopy 2017, 2018. - 2017 gastric bypass surgery. Medical A Surgical History  Notes nd Gastrointestinal gastric bypass Objective Constitutional No acute distress. Vitals Time Taken: 8:50 AM, Height: 71 in, Weight: 230 lbs, BMI: 32.1, Temperature: 97.9 F,  Pulse: 68 bpm, Respiratory Rate: 18 breaths/min, Blood Pressure: 119/68 mmHg. Respiratory Normal work of breathing on room air. General Notes: 03/18/2022: The lateral ankle wound has contracted further with just slough and eschar present. He is not experiencing any pain in the location. His interdigital tinea pedis has responded well to over-the-counter antifungal spray powder. We had him in a zinc-based Coflex wrap and this stayed up well; edema control is much better this week. Integumentary (Hair, Skin) Wound #6 status is Open. Original cause of wound was Gradually Appeared. The date acquired was: 01/11/2022. The wound has been in treatment 6 weeks. The wound is located on the Right T - Web between 1st and 2nd. The wound measures 0.3cm length x 0.3cm width x 0.1cm depth; 0.071cm^2 area and 0.007cm^3 oe volume. There is Fat Layer (Subcutaneous Tissue) exposed. There is no tunneling or undermining noted. There is a medium amount of serosanguineous drainage noted. The wound margin is distinct with the outline attached to the wound base. There is large (67-100%) pink granulation within the wound bed. There is a small (1-33%) amount of necrotic tissue within the wound bed including Adherent Slough. The periwound skin appearance exhibited: Maceration. The periwound skin appearance did not exhibit: Callus, Crepitus, Excoriation, Induration, Rash, Scarring, Dry/Scaly, Atrophie Blanche, Cyanosis, Ecchymosis, Hemosiderin Staining, Mottled, Pallor, Rubor, Erythema. Periwound temperature was noted as No Abnormality. The periwound has tenderness on palpation. Wound #7 status is Open. Original cause of wound was Gradually Appeared. The date acquired was: 01/11/2022. The wound has been in treatment 6 weeks. The wound is located on  the Right,Lateral Ankle. The wound measures 0.3cm length x 0.3cm width x 0.1cm depth; 0.071cm^2 area and 0.007cm^3 volume. There is Fat Layer (Subcutaneous Tissue) exposed. There is no tunneling or undermining noted. There is a medium amount of serosanguineous drainage noted. The wound margin is distinct with the outline attached to the wound base. There is large (67-100%) red, pink granulation within the wound bed. There is a small (1- 33%) amount of necrotic tissue within the wound bed including Eschar and Adherent Slough. The periwound skin appearance did not exhibit: Callus, Crepitus, Excoriation, Induration, Rash, Scarring, Dry/Scaly, Maceration, Atrophie Blanche, Cyanosis, Ecchymosis, Hemosiderin Staining, Mottled, Pallor, Rubor, Erythema. Periwound temperature was noted as No Abnormality. The periwound has tenderness on palpation. 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 There was a Selective/Open Wound Non-Viable Tissue Debridement with a total area of 0.09 sq cm performed by Fredirick Maudlin, MD. With the following instrument(s): Curette to remove Non-Viable tissue/material. Material removed includes Eschar and Slough and after achieving pain control using Lidocaine 4% Topical Solution. No specimens were taken. A time out was conducted at 09:03, prior to the start of the procedure. A Minimum amount of bleeding was controlled with Pressure. The procedure was tolerated well. Post Debridement Measurements: 0.3cm length  x 0.3cm width x 0.1cm depth; 0.007cm^3 volume. Character of Wound/Ulcer Post Debridement is improved. Severity of Tissue Post Debridement is: Fat layer exposed. Post procedure Diagnosis Wound #7: Same as Pre-Procedure General Notes: scribed for Dr. Celine Ahr by Adline Peals, RN. Pre-procedure diagnosis of Wound #7  is a Venous Leg Ulcer located on the Right,Lateral Ankle . There was a Double Layer Compression Therapy Procedure by Adline Peals, RN. Post procedure Diagnosis Wound #7: Same as Pre-Procedure Plan Follow-up Appointments: Return Appointment in 1 week. - Dr. Celine Ahr rm 4 Other: - [facility] Speak with Damita Dunnings about venous studies phone [phone]. Anesthetic: (In clinic) Topical Lidocaine 4% applied to wound bed - all wounds prior to debridement Bathing/ Shower/ Hygiene: May shower with protection but do not get wound dressing(s) wet. Edema Control - Lymphedema / SCD / Other: Lymphedema Pumps. Use Lymphedema pumps on leg(s) 2-3 times a day for 45-60 minutes. If wearing any wraps or hose, do not remove them. Continue exercising as instructed. Elevate legs to the level of the heart or above for 30 minutes daily and/or when sitting, a frequency of: Avoid standing for long periods of time. Exercise regularly Compression stocking or Garment 20-30 mm/Hg pressure to: - toleftleg daily The following medication(s) was prescribed: lidocaine topical 4 % cream cream topical was prescribed at facility WOUND #6: - T - Web between 1st and 2nd Wound Laterality: Right oe Cleanser: Soap and Water 1 x Per Week/7 Days Discharge Instructions: May shower and wash wound with dial antibacterial soap and water prior to dressing change. Cleanser: Wound Cleanser 1 x Per Week/7 Days Discharge Instructions: Cleanse the wound with wound cleanser prior to applying a clean dressing using gauze sponges, not tissue or cotton balls. Topical: Antifungal spray powder 1 x Per Week/7 Days WOUND #7: -  Ankle Wound Laterality: Right, Lateral Cleanser: Soap and Water 1 x Per Week/7 Days Discharge Instructions: May shower and wash wound with dial antibacterial soap and water prior to dressing change. Cleanser: Wound Cleanser 1 x Per Week/7 Days Discharge Instructions: Cleanse the wound with wound cleanser prior to applying a clean dressing using gauze sponges, not tissue or cotton balls. Peri-Wound Care: Sween Lotion (Moisturizing lotion) 1 x Per Week/7 Days Discharge Instructions: Apply moisturizing lotion as directed Topical: Mupirocin Ointment 1 x Per Week/7 Days Discharge Instructions: Apply Mupirocin (Bactroban) as instructed Prim Dressing: Sorbalgon AG Dressing 2x2 (in/in) 1 x Per Week/7 Days ary Discharge Instructions: Apply to wound bed as instructed Secondary Dressing: ABD Pad, 5x9 1 x Per Week/7 Days Discharge Instructions: Apply over primary dressing as directed. Secondary Dressing: Woven Gauze Sponge, Non-Sterile 4x4 in 1 x Per Week/7 Days Discharge Instructions: Apply over primary dressing as directed. Com pression Wrap: FourPress (4 layer compression wrap) 1 x Per Week/7 Days Discharge Instructions: Apply four layer compression as directed. May also use [place] CoFlex 2 layer compression system as alternative. 03/18/2022: The lateral ankle wound has contracted further with just slough and eschar present. He is not experiencing any pain in the location. His interdigital tinea pedis has responded well to over-the-counter antifungal spray powder. We had him in a zinc-based Coflex wrap and this stayed up well; edema control is much better this week. I used a curette to debride slough and eschar from the ankle. He will continue to use the over-the-counter antifungal spray powder between his toes. We will continue topical mupirocin with silver alginate to the ankle wound. Continue zinc-based Coflex wrap. Follow-up in 1 week. Electronic Signature(s) Signed: 03/18/2022 3:51:07 PM By:  Adline Peals Signed: 03/18/2022 5:07:18 PM By: Fredirick Maudlin MD FACS Previous Signature: 03/18/2022 10:18:37 AM Version By: Fredirick Maudlin MD FACS Entered By: Adline Peals on 03/18/2022 10:42:53 Adah Salvage ([id number]) [id number] Page 9 of 11 -------------------------------------------------------------------------------- HxROS Details Patient Name: Date  of Service: [name], [name] 03/18/2022 8:30 A M Medical Record Number: [id number] Patient Account Number: [id number] Date of Birth/Sex: Treating RN: [birth year] (61 y.o. M) Primary Care Provider: Evonnie Dawes, Jenny Reichmann Other Clinician: Referring Provider: Treating Provider/Extender: Evette Georges in Treatment: 6 Information Obtained From Patient Eyes Medical History: Negative for: Cataracts; Glaucoma; Optic Neuritis Ear/Nose/Mouth/Throat Medical History: Negative for: Chronic sinus problems/congestion; Middle ear problems Hematologic/Lymphatic Medical History: Positive for: Anemia Negative for: Hemophilia; Human Immunodeficiency Virus; Lymphedema; Sickle Cell Disease Respiratory Medical History: Positive for: Sleep Apnea Negative for: Aspiration; Asthma; Chronic Obstructive Pulmonary Disease (COPD); Pneumothorax; Tuberculosis Cardiovascular Medical History: Positive for: Congestive Heart Failure; Coronary Artery Disease; Peripheral Venous Disease Negative for: Angina; Arrhythmia; Deep Vein Thrombosis; Hypertension; Hypotension; Myocardial Infarction; Peripheral Arterial Disease; Phlebitis; Vasculitis Gastrointestinal Medical History: Negative for: Cirrhosis ; Colitis; Crohns; Hepatitis A; Hepatitis B; Hepatitis C Past Medical History Notes: gastric bypass Endocrine Medical History: Negative for: Type I Diabetes; Type II Diabetes Genitourinary Medical History: Negative for: End Stage Renal Disease Immunological Medical History: Negative for: Lupus  Erythematosus; Raynauds; Scleroderma Integumentary (Skin) Medical History: Negative for: History of Burn Musculoskeletal Medical History: Positive for: Osteoarthritis Negative for: Gout; Rheumatoid Arthritis; Osteomyelitis TYDARIUS, YAWN ([id number]) [id number] Page 10 of 11 Neurologic Medical History: Negative for: Dementia; Neuropathy; Quadriplegia; Paraplegia; Seizure Disorder Oncologic Medical History: Negative for: Received Chemotherapy; Received Radiation Psychiatric Medical History: Negative for: Anorexia/bulimia; Confinement Anxiety Immunizations Pneumococcal Vaccine: Received Pneumococcal Vaccination: Yes Received Pneumococcal Vaccination On or After 60th Birthday: Yes Implantable Devices None Hospitalization / Surgery History Type of Hospitalization/Surgery right hip nailing 10/17/2019 05/17/2018 nephrolithotomy left 01/29/2017 right lymph noes biopsy cystoscopy 2017, 2018 2017 gastric bypass surgery Family and Social History Cancer: Yes; Diabetes: Yes - Father; Heart Disease: No; Hereditary Spherocytosis: No; Hypertension: Yes - Father; Kidney Disease: No; Lung Disease: No; Seizures: No; Stroke: No; Thyroid Problems: No; Tuberculosis: No; Current every day smoker; Marital Status - Divorced; Alcohol Use: Never; Drug Use: Prior History; Caffeine Use: Rarely; Financial Concerns: No; Food, Clothing or Shelter Needs: No; Support System Lacking: No; Transportation Concerns: No Engineer, maintenance) Signed: 03/18/2022 5:07:18 PM By: Fredirick Maudlin MD FACS Entered By: Fredirick Maudlin on 03/18/2022 10:16:27 -------------------------------------------------------------------------------- SuperBill Details Patient Name: Date of Service: Marvin [place], [place] UL J. 03/18/2022 Medical Record Number: [id number] Patient Account Number: [id number] Date of Birth/Sex: Treating RN: 1960-05-21 (61 y.o. M) Primary Care Provider: Evonnie Dawes, Jenny Reichmann Other  Clinician: Referring Provider: Treating Provider/Extender: Evette Georges in Treatment: 6 Diagnosis Coding ICD-10 Codes Code Description [phone] Chronic venous hypertension (idiopathic) with ulcer and inflammation of right lower extremity 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 Facility Procedures : [place], [place] Code: [id number] UL J ([id number]) Description: 97597 - DEBRIDE WOUND 1ST 20 SQ CM OR < ICD-10 Diagnosis Description L97.312 Non-pressure chronic ulcer of right ankle with fat layer exposed [phone] Modifier: [id number] Quantity: 1 27.pdf Page 11 of 11 Physician Procedures : CPT4 Code Description Modifier [id number] - WC PHYS LEVEL 4 - EST PT 25 ICD-10 Diagnosis Description R48.546 Non-pressure chronic ulcer of right ankle with fat layer exposed I87.331 Chronic venous hypertension (idiopathic) with ulcer and  inflammation of right lower extremity L97.512 Non-pressure chronic ulcer of other part of right foot with fat layer exposed Quantity: 1 : [id number] 97597 - WC PHYS DEBR WO ANESTH 20 SQ CM ICD-10 Diagnosis Description L97.312 Non-pressure  chronic ulcer of right ankle with fat layer exposed Quantity: 1 Electronic Signature(s) Signed: 03/18/2022 10:18:55 AM By: Fredirick Maudlin MD FACS Entered By: Fredirick Maudlin on 03/18/2022 10:18:54

## 2022-03-23 DIAGNOSIS — F1111 Opioid abuse, in remission: Secondary | ICD-10-CM | POA: Diagnosis not present

## 2022-03-24 ENCOUNTER — Ambulatory Visit: Payer: PPO | Attending: Cardiology | Admitting: Cardiology

## 2022-03-24 ENCOUNTER — Encounter (HOSPITAL_BASED_OUTPATIENT_CLINIC_OR_DEPARTMENT_OTHER): Payer: PPO | Admitting: Internal Medicine

## 2022-03-24 DIAGNOSIS — I87331 Chronic venous hypertension (idiopathic) with ulcer and inflammation of right lower extremity: Secondary | ICD-10-CM | POA: Diagnosis not present

## 2022-03-24 DIAGNOSIS — L97312 Non-pressure chronic ulcer of right ankle with fat layer exposed: Secondary | ICD-10-CM | POA: Diagnosis not present

## 2022-03-24 DIAGNOSIS — L97512 Non-pressure chronic ulcer of other part of right foot with fat layer exposed: Secondary | ICD-10-CM | POA: Diagnosis not present

## 2022-03-24 NOTE — Progress Notes (Signed)
Marvin, [name] ([id number]) [id number] Page 1 of 9 Visit Report for 03/24/2022 Arrival Information Details Patient Name: Date of Service: [name], [place] J. 03/24/2022 7:30 A M Medical Record Number: [id number] Patient Account Number: [id number] Date of Birth/Sex: Treating RN: 01-17-1961 (61 y.o. [name] Primary Care Daniyla Pfahler: Evonnie Dawes, Jenny Reichmann Other Clinician: Referring Quantia Grullon: Treating Jaydyn Menon/Extender: Patton Salles in Treatment: 7 Visit Information History Since Last Visit Added or deleted any medications: No Patient Arrived: [name] Any new allergies or adverse reactions: No Arrival Time: 07:41 Had a fall or experienced change in No Accompanied By: self activities of daily living that may affect Transfer Assistance: None risk of falls: Patient Identification Verified: Yes Signs or symptoms of abuse/neglect since last visito No Secondary Verification Process Completed: Yes Hospitalized since last visit: No Patient Requires Transmission-Based Precautions: No Implantable device outside of the clinic excluding No Patient Has Alerts: No cellular tissue based products placed in the center since last visit: Has Dressing in Place as Prescribed: Yes Has Compression in Place as Prescribed: Yes Pain Present Now: No Electronic Signature(s) Signed: 03/24/2022 3:54:33 PM By: Adline Peals Entered By: Adline Peals on 03/24/2022 07:41:54 -------------------------------------------------------------------------------- Compression Therapy Details Patient Name: Date of Service: Marvin Atlas, [place] J. 03/24/2022 7:30 A M Medical Record Number: [id number] Patient Account Number: [id number] Date of Birth/Sex: Treating RN: Nov 21, 1960 (61 y.o. [name] Primary Care Kemaria Dedic: Evonnie Dawes, Jenny Reichmann Other Clinician: Referring Creighton Longley: Treating Nafisa Olds/Extender: Patton Salles in Treatment:  7 Compression Therapy Performed for Wound Assessment: Wound #7 Right,Lateral Ankle Performed By: Clinician Adline Peals, RN Compression Type: Double Layer Post Procedure Diagnosis Same as Pre-procedure Electronic Signature(s) Signed: 03/24/2022 3:54:33 PM By: Sabas Sous By: Adline Peals on 03/24/2022 08:16:53 Adah Salvage ([id number]) [id number] Page 2 of 9 -------------------------------------------------------------------------------- Encounter Discharge Information Details Patient Name: Date of Service: [name], [place] J. 03/24/2022 7:30 A M Medical Record Number: [id number] Patient Account Number: [id number] Date of Birth/Sex: Treating RN: 11-Jun-1960 (61 y.o. [name] Primary Care [name]: Evonnie Dawes, Jenny Reichmann Other Clinician: Referring Massey Ruhland: Treating Nailyn Dearinger/Extender: Patton Salles in Treatment: 7 Encounter Discharge Information Items Discharge Condition: Stable Ambulatory Status: Cane Discharge Destination: Home Transportation: Private Auto Accompanied By: self Schedule Follow-up Appointment: Yes Clinical Summary of Care: Patient Declined Electronic Signature(s) Signed: 03/24/2022 3:54:33 PM By: Sabas Sous By: Adline Peals on 03/24/2022 08:17:33 -------------------------------------------------------------------------------- Lower Extremity Assessment Details Patient Name: Date of Service: Marvin Atlas, [place] J. 03/24/2022 7:30 A M Medical Record Number: [id number] Patient Account Number: [id number] Date of Birth/Sex: Treating RN: [birth year] (61 y.o. [name] Primary Care Coburn Knaus: Evonnie Dawes, Jenny Reichmann Other Clinician: Referring Mannie Wineland: Treating Taren Dymek/Extender: Patton Salles in Treatment: 7 Edema Assessment Assessed: [Left: No] [Right: No] Edema: [Left: Ye] [Right: s] Calf Left: Right: Point of Measurement: 42.5 cm From  Medial Instep 41.2 cm Ankle Left: Right: Point of Measurement: 11.5 cm From Medial Instep 23 cm Vascular Assessment Pulses: Dorsalis Pedis Palpable: [Right:Yes] Electronic Signature(s) Signed: 03/24/2022 3:54:33 PM By: Adline Peals Entered By: Adline Peals on 03/24/2022 07:49:00 Multi Wound Chart Details -------------------------------------------------------------------------------- Adah Salvage ([id number]) [id number] Page 3 of 9 Patient Name: Date of Service: [name], [place] J. 03/24/2022 7:30 A M Medical Record Number: [id number] Patient Account Number: [id number] Date of Birth/Sex: Treating RN: 28-Mar-1961 (61 y.o. M) Primary Care Maxemiliano Riel: Evonnie Dawes, Jenny Reichmann Other Clinician: Referring Mykell Genao: Treating Marvell Stavola/Extender: Harlen Labs,  John Weeks in Treatment: 7 Vital Signs Height(in): 71 Pulse(bpm): 1 Weight(lbs): 230 Blood Pressure(mmHg): 109/70 Body Mass Index(BMI): 32.1 Temperature(F): 98.3 Respiratory Rate(breaths/min): 18 Wound Assessments Wound Number: 6 7 N/A Photos: N/A Right T - Web between 1st and 2nd oe Right, Lateral Ankle N/A Wound Location: Gradually Appeared Gradually Appeared N/A Wounding Event: Lymphedema Venous Leg Ulcer N/A Primary Etiology: Anemia, Sleep Apnea, Congestive Anemia, Sleep Apnea, Congestive N/A Comorbid History: Heart Failure, Coronary Artery Heart Failure, Coronary Artery Disease, Peripheral Venous Disease, Disease, Peripheral Venous Disease, Osteoarthritis Osteoarthritis 01/11/2022 01/11/2022 N/A Date Acquired: 7 7 N/A Weeks of Treatment: Open Open N/A Wound Status: No No N/A Wound Recurrence: 0x0x0 0.2x0.4x0.1 N/A Measurements L x W x D (cm) 0 0.063 N/A A (cm) : rea 0 0.006 N/A Volume (cm) : 100.00% 97.00% N/A % Reduction in Area: 100.00% 98.60% N/A % Reduction in Volume: Full Thickness With Exposed Support Full Thickness With Exposed Support  N/A Classification: Structures Structures None Present Medium N/A Exudate Amount: N/A Serosanguineous N/A Exudate Type: N/A red, brown N/A Exudate Color: Flat and Intact Distinct, outline attached N/A Wound Margin: None Present (0%) Large (67-100%) N/A Granulation Amount: N/A Red, Pink N/A Granulation Quality: None Present (0%) None Present (0%) N/A Necrotic Amount: Fascia: No Fat Layer (Subcutaneous Tissue): Yes N/A Exposed Structures: Fat Layer (Subcutaneous Tissue): No Fascia: No Tendon: No Tendon: No Muscle: No Muscle: No Joint: No Joint: No Bone: No Bone: No Large (67-100%) Medium (34-66%) N/A Epithelialization: Excoriation: No Excoriation: No N/A Periwound Skin Texture: Induration: No Induration: No Callus: No Callus: No Crepitus: No Crepitus: No Rash: No Rash: No Scarring: No Scarring: No Maceration: Yes Maceration: No N/A Periwound Skin Moisture: Dry/Scaly: No Dry/Scaly: No Atrophie Blanche: No Atrophie Blanche: No N/A Periwound Skin Color: Cyanosis: No Cyanosis: No Ecchymosis: No Ecchymosis: No Erythema: No Erythema: No Hemosiderin Staining: No Hemosiderin Staining: No Mottled: No Mottled: No Pallor: No Pallor: No Rubor: No Rubor: No No Abnormality No Abnormality N/A Temperature: Yes Yes N/A Tenderness on Palpation: Treatment Notes Electronic Signature(s) Signed: 03/24/2022 3:46:52 PM By: Linton Ham MD Adah Salvage ([id number])0:35:59 PM By: Linton Ham MD [phone].pdf Page 4 of 9 Signed: 03/24/2022 Entered By: Linton Ham on 03/24/2022 08:01:36 -------------------------------------------------------------------------------- Multi-Disciplinary Care Plan Details Patient Name: Date of Service: [name], [place] J. 03/24/2022 7:30 A M Medical Record Number: [id number] Patient Account Number: [id number] Date of Birth/Sex: Treating RN: Sep 07, 1960 (61 y.o. [name] Primary Care  Konor Noren: Evonnie Dawes, Jenny Reichmann Other Clinician: Referring Cassey Bacigalupo: Treating Tramell Piechota/Extender: Patton Salles in Treatment: 7 Multidisciplinary Care Plan reviewed with physician Active Inactive Venous Leg Ulcer Nursing Diagnoses: Actual venous Insuffiency (use after diagnosis is confirmed) Knowledge deficit related to disease process and management Potential for venous Insuffiency (use before diagnosis confirmed) Goals: Non-invasive venous studies are completed as ordered Date Initiated: 02/03/2022 Target Resolution Date: 05/15/2022 Goal Status: Active Patient will maintain optimal edema control Date Initiated: 02/03/2022 Target Resolution Date: 05/15/2022 Goal Status: Active Patient/caregiver will verbalize understanding of disease process and disease management Date Initiated: 02/03/2022 Date Inactivated: 02/17/2022 Target Resolution Date: 02/17/2022 Goal Status: Met Interventions: Assess peripheral edema status every visit. Compression as ordered Provide education on venous insufficiency Notes: Wound/Skin Impairment Nursing Diagnoses: Impaired tissue integrity Knowledge deficit related to ulceration/compromised skin integrity Goals: Patient will have a decrease in wound volume by X% from date: (specify in notes) Date Initiated: 02/03/2022 Target Resolution Date: 05/15/2022 Goal Status: Active Patient/caregiver will verbalize understanding of skin care regimen Date Initiated:  02/03/2022 Date Inactivated: 03/18/2022 Target Resolution Date: 03/12/2022 Goal Status: Met Ulcer/skin breakdown will have a volume reduction of 30% by week 4 Date Initiated: 02/03/2022 Date Inactivated: 03/11/2022 Target Resolution Date: 02/26/2022 Goal Status: Met Ulcer/skin breakdown will have a volume reduction of 50% by week 8 Date Initiated: 03/11/2022 Target Resolution Date: 05/15/2022 Goal Status: Active Interventions: Assess patient/caregiver ability to obtain necessary  supplies Assess patient/caregiver ability to perform ulcer/skin care regimen upon admission and as needed Assess ulceration(s) every visit Notes: LENNYN, BELLANCA ([id number]) [id number] Page 5 of 9 Electronic Signature(s) Signed: 03/24/2022 3:54:33 PM By: Adline Peals Entered By: Adline Peals on 03/24/2022 08:17:00 -------------------------------------------------------------------------------- Pain Assessment Details Patient Name: Date of Service: [name], [name] 03/24/2022 7:30 A M Medical Record Number: [id number] Patient Account Number: [id number] Date of Birth/Sex: Treating RN: 01-10-1961 (61 y.o. [name] Primary Care Jayleana Colberg: Evonnie Dawes, Jenny Reichmann Other Clinician: Referring Shakeel Disney: Treating Ozell Juhasz/Extender: Patton Salles in Treatment: 7 Active Problems Location of Pain Severity and Description of Pain Patient Has Paino No Site Locations Rate the pain. Current Pain Level: 0 Pain Management and Medication Current Pain Management: Electronic Signature(s) Signed: 03/24/2022 3:54:33 PM By: Adline Peals Entered By: Adline Peals on 03/24/2022 07:43:01 -------------------------------------------------------------------------------- Patient/Caregiver Education Details Patient Name: Date of Service: Marvin Atlas, [place] J. 12/12/2023andnbsp7:30 A M Medical Record Number: [id number] Patient Account Number: [id number] Date of Birth/Gender: Treating RN: December 19, 1960 (61 y.o. [name] Primary Care Physician: Evonnie Dawes, Jenny Reichmann Other Clinician: Referring Physician: Treating Physician/Extender: Patton Salles in Treatment: 7 Education Assessment Education Provided To: GREOGORY, CORNETTE ([id number]) [id number] Page 6 of 9 Patient Education Topics Provided Venous: Methods: Explain/Verbal Responses: Reinforcements needed, State content  correctly Electronic Signature(s) Signed: 03/24/2022 3:54:33 PM By: Adline Peals Entered By: Adline Peals on 03/24/2022 08:17:12 -------------------------------------------------------------------------------- Wound Assessment Details Patient Name: Date of Service: Marvin Atlas, PA [place] J. 03/24/2022 7:30 A M Medical Record Number: [id number] Patient Account Number: [id number] Date of Birth/Sex: Treating RN: [birth year] (61 y.o. [name] Primary Care Blessing Zaucha: Evonnie Dawes, John Other Clinician: Referring Dyanna Seiter: Treating Yavier Snider/Extender: Patton Salles in Treatment: 7 Wound Status Wound Number: 6 Primary Lymphedema Etiology: Wound Location: Right T - Web between 1st and 2nd oe Wound Open Wounding Event: Gradually Appeared Status: Date Acquired: 01/11/2022 Comorbid Anemia, Sleep Apnea, Congestive Heart Failure, Coronary Artery Weeks Of Treatment: 7 History: Disease, Peripheral Venous Disease, Osteoarthritis Clustered Wound: No Photos Wound Measurements Length: (cm) Width: (cm) Depth: (cm) Area: (cm) Volume: (cm) 0 % Reduction in Area: 100% 0 % Reduction in Volume: 100% 0 Epithelialization: Large (67-100%) 0 Tunneling: No 0 Undermining: No Wound Description Classification: Full Thickness With Exposed Support Structures Wound Margin: Flat and Intact Exudate Amount: None Present Foul Odor After Cleansing: No Slough/Fibrino No Wound Bed Granulation Amount: None Present (0%) Exposed Structure Necrotic Amount: None Present (0%) Fascia Exposed: No Fat Layer (Subcutaneous Tissue) Exposed: No Tendon Exposed: No Muscle Exposed: No Joint Exposed: No Bone Exposed: No [address] JEET, SHOUGH ([id number]) [id number] Page 7 of 9 Texture Color No Abnormalities Noted: No No Abnormalities Noted: No Callus: No Atrophie Blanche: No Crepitus: No Cyanosis: No Excoriation: No Ecchymosis:  No Induration: No Erythema: No Rash: No Hemosiderin Staining: No Scarring: No Mottled: No Pallor: No Moisture Rubor: No No Abnormalities Noted: Yes Temperature / Pain Temperature: No Abnormality Tenderness on Palpation: Yes Electronic Signature(s) Signed: 03/24/2022 3:54:33 PM By: Adline Peals Entered By: Adline Peals  on 03/24/2022 07:51:22 -------------------------------------------------------------------------------- Wound Assessment Details Patient Name: Date of Service: [name], [place] J. 03/24/2022 7:30 A M Medical Record Number: [id number] Patient Account Number: [id number] Date of Birth/Sex: Treating RN: Jul 05, 1960 (61 y.o. [name] Primary Care Takeya Marquis: Evonnie Dawes, Jenny Reichmann Other Clinician: Referring Averiana Clouatre: Treating Lory Nowaczyk/Extender: Patton Salles in Treatment: 7 Wound Status Wound Number: 7 Primary Venous Leg Ulcer Etiology: Wound Location: Right, Lateral Ankle Wound Open Wounding Event: Gradually Appeared Status: Date Acquired: 01/11/2022 Comorbid Anemia, Sleep Apnea, Congestive Heart Failure, Coronary Artery Weeks Of Treatment: 7 History: Disease, Peripheral Venous Disease, Osteoarthritis Clustered Wound: No Photos Wound Measurements Length: (cm) 0.2 Width: (cm) 0.4 Depth: (cm) 0.1 Area: (cm) 0.063 Volume: (cm) 0.006 % Reduction in Area: 97% % Reduction in Volume: 98.6% Epithelialization: Medium (34-66%) Tunneling: No Undermining: No Wound Description Classification: Full Thickness With Exposed Support Structures Wound Margin: Distinct, outline attached Exudate Amount: Medium Exudate Type: Serosanguineous Exudate Color: red, brown Foul Odor After Cleansing: No Slough/Fibrino No Wound Bed Granulation Amount: Large (67-100%) Exposed KWAMAINE, CUPPETT ([id number]) [id number] Page 8 of 9 Granulation Quality: Red, Pink Fascia Exposed: No Necrotic Amount: None  Present (0%) Fat Layer (Subcutaneous Tissue) Exposed: Yes Tendon Exposed: No Muscle Exposed: No Joint Exposed: No Bone Exposed: No Periwound Skin Texture Texture Color No Abnormalities Noted: No No Abnormalities Noted: No Callus: No Atrophie Blanche: No Crepitus: No Cyanosis: No Excoriation: No Ecchymosis: No Induration: No Erythema: No Rash: No Hemosiderin Staining: No Scarring: No Mottled: No Pallor: No Moisture Rubor: No No Abnormalities Noted: No Dry / Scaly: No Temperature / Pain Maceration: No Temperature: No Abnormality Tenderness on Palpation: Yes Treatment Notes Wound #7 (Ankle) Wound Laterality: Right, Lateral Cleanser Soap and Water Discharge Instruction: May shower and wash wound with dial antibacterial soap and water prior to dressing change. Wound Cleanser Discharge Instruction: Cleanse the wound with wound cleanser prior to applying a clean dressing using gauze sponges, not tissue or cotton balls. Peri-Wound Care Sween Lotion (Moisturizing lotion) Discharge Instruction: Apply moisturizing lotion as directed Topical Mupirocin Ointment Discharge Instruction: Apply Mupirocin (Bactroban) as instructed Primary Dressing Sorbalgon AG Dressing 2x2 (in/in) Discharge Instruction: Apply to wound bed as instructed Secondary Dressing Woven Gauze Sponge, Non-Sterile 4x4 in Discharge Instruction: Apply over primary dressing as directed. Secured With Compression Wrap CoFlex TLC XL 2-layer Compression System 4x7 (in/yd) Discharge Instruction: Apply CoFlex 2-layer with ZINC compression as directed. (alt for 4 layer) Compression Stockings Add-Ons Electronic Signature(s) Signed: 03/24/2022 3:54:33 PM By: Adline Peals Entered By: Adline Peals on 03/24/2022 07:51:07 -------------------------------------------------------------------------------- Vitals Details Patient Name: Date of Service: Marvin Atlas, PA [place] J. 03/24/2022 7:30 A M Medical Record Number:  [id number] Patient Account Number: [id number] Date of Birth/Sex: Treating RN: 06-Jul-1960 (61 y.o. Mayur, Duman, Nickola Major ([id number]) [phone].pdf Page 9 of 9 Primary Care Orhan Mayorga: Evonnie Dawes, Jenny Reichmann Other Clinician: Referring Avalyn Molino: Treating Malya Cirillo/Extender: Patton Salles in Treatment: 7 Vital Signs Time Taken: 07:43 Temperature (F): 98.3 Height (in): 71 Pulse (bpm): 62 Weight (lbs): 230 Respiratory Rate (breaths/min): 18 Body Mass Index (BMI): 32.1 Blood Pressure (mmHg): 109/70 Reference Range: 80 - 120 mg / dl Electronic Signature(s) Signed: 03/24/2022 3:54:33 PM By: Adline Peals Entered By: Adline Peals on 03/24/2022 07:43:39

## 2022-03-24 NOTE — Progress Notes (Signed)
REYNOL, ARNONE ([id number]) [id number] Page 1 of 7 Visit Report for 03/24/2022 HPI Details Patient Name: Date of Service: [name], [place] J. 03/24/2022 7:30 A M Medical Record Number: [id number] Patient Account Number: [id number] Date of Birth/Sex: Treating RN: 02/24/1961 (61 y.o. M) Primary Care Provider: Evonnie Dawes, Jenny Reichmann Other Clinician: Referring Provider: Treating Provider/Extender: Patton Salles in Treatment: 7 History of Present Illness HPI Description: ADMISSION 05/20/2020 This is a 61 year old man who is not a diabetic however he has a long history of chronic venous insufficiency with wounds on his lower extremities. He went to the wound care center in [place] for a long period of time with recurrent wounds in these areas treated with compression wraps. He comes in today with wounds on his bilateral lower legs that he says have been there about a year on the right and about 6 months on the left. He went to see Dr. Oneida Alar on 01/17/2020 who noted that he had no history of DVT but does have a history of venous insufficiency wounds. They noted that he was considering laser ablation in October 2020 but he canceled the procedure. He had an episode of bleeding from varicose veins in 2019. He had repeated venous reflux study on 10/6 which showed no evidence of a DVT or SVT Noted that he had venous reflux in the right greater saphenous vein in the thigh and the calf on the right. Also . noted to have venous reflux in the right femoral vein right popliteal vein and right small saphenous vein. At the time in October they wanted the wounds to heal a bit he also also had a right groin wound which is apparently closed. Not sure about the follow-up with vein and vascular. He comes in today with some light compression wraps and collagen on the wound placed by Dr. Lilia Pro who is a Education officer, environmental in [place]. He has 2 areas on the left leg 1 anteriorly  and a large area posteriorly and a smaller area on the right anterior lower leg. He has marked lower extremity edema which is nonpitting. Hemosiderin deposition on both lower legs extensively. There is dilated venules in his feet. Past medical history includes history of opioid abuse in remission, left nephrolithiasis, history of an ablation although I did not see this in Dr. Oneida Alar notes, congestive heart failure and anemia ABIs in our clinic were noncompressible bilaterally 05/27/2020; we admitted this patient to clinic last week. He has bilateral superficial venous insufficiency with bilateral wounds left greater than right. We put him in 4 layer compression with silver alginate. The he was previously cared for in the [facility] clinic when it was open. His wounds are better with compression this week and there is certainly less swelling. 2/21; bilateral superficial venous insufficiency wounds. A cluster on the left lateral. He has very well-defined areas on the right anterior right and right lateral. We are using silver alginate and 4-layer compression 2/28; bilateral superficial venous insufficiency wounds in the setting of significant stage III lymphedema. He has a cluster on the left which is smaller. His area on the right anterior and right lateral also look better. 3/14 superficial bilateral venous insufficiency wounds. Much better on both sides in fact the area on the right posterior is healed. He still has 2 small open areas on the right anterior and the left lateral 3/21; superficial bilateral venous insufficiency wounds. Everything is closed on the right side. Small area still remains on the left.  We will transition him today to his right extremiteaze stocking. Still dressing the remaining wound on the left with compression 3/28; the patient's right leg remains closed in his wraparound stocking. Disappointingly today the area on the left lateral leg looks about the same. I thought  this might be closed with been using silver alginate 4/4; right leg remains in his wraparound stocking.. Left lateral leg measures smaller even under illumination the wound looks good. Changed him to [facility] with additional ABD compression last week 4/11; left lateral posterior very tiny superficial wound. This should be closed by next week. Using Hydrofera Blue. 4/18; left lateral wound is totally epithelialized. He has his own external compression stocking and compression pumps. READMISSION 02/03/2022 This is a 62 year old man well-known to the wound care center. He has chronic venous insufficiency and lymphedema. He has been evaluated in the past and has documented venous reflux, but thus far, has not undergone ablation. He returns to clinic today with multiple wounds on his right lower extremity. He reports that the medial lower leg wound started when something sharp stuck into his leg about a year ago. He says he was trying to treat this at home and took a course of cephalexin that he had leftover from a previous event. It did not improve and in the past 3 to 4 weeks, it has actually gotten worse, to the point that he is unable to use his lymphedema pumps secondary to pain. He has another ulcer on his left lateral ankle and one between his first and second toes, both present for about 3 to 4 weeks, per his report. On his right medial lower leg, there are 2 ulcers with slough accumulated. The surface underneath the slough is quite fibrous, consistent with his history of multiple lower leg ulcerations. He has a triangular wound on his right lateral ankle, similar in appearance to the others. Between his first and second toes, he has what appears to be athlete's foot, with a fungal odor and skin breakdown with eschar and slough present. 02/09/2022: On the right medial lower leg wound, there is significant slough accumulation. The right lateral ankle wound is gray and the patient reports  an increase in pain in this site. The area between his first and second toes has dried out considerably. The more proximal ulcer on his right posterior leg has some slough buildup over a fibrotic surface. Edema control is markedly improved. 02/17/2022: The right medial lower leg and right lateral ankle wound still have a lot of slough accumulation, but they are less painful. The culture that I took last week grew out multiple gram-positive species. The area between his first and second toes still has a consistent appearance with athlete's foot. The more proximal lateral lower leg wound has healed. He still has not been scheduled for his repeat venous reflux studies. 02/25/2022: He had his repeat reflux studies done but for some reason they only did the right leg. He does have evidence of reflux. In the past, bilateral studies were performed and he has significant venous reflux in both legs. His edema control is excellent this week. The areas between his toes are drying up. The right medial lower leg wound is substantially smaller with just a little bit of eschar overlying the open area. The lateral ankle wound still has some slough and eschar accumulation and remains fairly tender. 03/11/2022: The right medial leg wound has closed. The lateral ankle wound is substantially smaller with just some slough and eschar present.  It is tender, but less so than on previous visits. He is still having some issues with moisture between his toes and athlete's foot. His wrap slipped and the area above the wrap [name], [name] ([id number]) [id number] Page 2 of 7 is markedly swollen, well the areas that was still contained in the wrap has excellent edema control. 03/18/2022: The lateral ankle wound has contracted further with just slough and eschar present. He is not experiencing any pain in the location. His interdigital tinea pedis has responded well to over-the-counter antifungal spray  powder. We had him in a zinc-based Coflex wrap and this stayed up well; edema control is much better this week. 12/12; only a small open wound remains on the right lateral ankle. The medial ankle is closed. The interdigital is a a result of tinea and that is closed as well. Primary dressing is silver alginate under compression. The patient is not using his compression pumps that this area. He has some form of compression stocking on the left leg that he was also wearing on the right at the time this wound formed although I am not sure of the compression. He brought these on [employer]) Signed: 03/24/2022 3:46:52 PM By: Linton Ham MD Entered By: Linton Ham on 03/24/2022 08:05:09 -------------------------------------------------------------------------------- Physical Exam Details Patient Name: Date of Service: Marvin Atlas, [place] J. 03/24/2022 7:30 A M Medical Record Number: [id number] Patient Account Number: [id number] Date of Birth/Sex: Treating RN: August 06, 1960 (61 y.o. M) Primary Care Provider: Evonnie Dawes, Jenny Reichmann Other Clinician: Referring Provider: Treating Provider/Extender: Patton Salles in Treatment: 7 Notes Lateral ankle wound is still open but small. No debridement was required. There is no surrounding infection his edema control in the area seems reasonable although his edema control in the upper leg is only moderate. Peripheral pulses are palpable. No evidence of infection Electronic Signature(s) Signed: 03/24/2022 3:46:52 PM By: Linton Ham MD Entered By: Linton Ham on 03/24/2022 08:06:02 -------------------------------------------------------------------------------- Physician Orders Details Patient Name: Date of Service: Marvin Atlas, Marvin [place] J. 03/24/2022 7:30 A M Medical Record Number: [id number] Patient Account Number: [id number] Date of Birth/Sex: Treating RN: 07/03/1960 (61 y.o. [name] Primary Care Provider:  Evonnie Dawes, Jenny Reichmann Other Clinician: Referring Provider: Treating Provider/Extender: Patton Salles in Treatment: 7 Verbal / Phone Orders: No Diagnosis Coding Follow-up Appointments ppointment in 1 week. - Dr. Celine Ahr rm 4 Return A Other: - Vein and Vascular Speak with Damita Dunnings about venous studies phone [phone]. Anesthetic (In clinic) Topical Lidocaine 5% applied to wound bed Bathing/ Shower/ Hygiene May shower with protection but do not get wound dressing(s) wet. Edema Control - Lymphedema / SCD / Other Lymphedema Pumps. Use Lymphedema pumps on leg(s) 2-3 times a day for 45-60 minutes. If wearing any wraps or hose, do not remove them. Continue exercising as instructed. Elevate legs to the level of the heart or above for 30 minutes daily and/or when sitting, a frequency of: Avoid standing for long periods of time. Exercise regularly [name], [name] ([id number]) [id number] Page 3 of 7 Compression stocking or Garment 20-30 mm/Hg pressure to: - toleftleg daily Wound Treatment Wound #7 - Ankle Wound Laterality: Right, Lateral Cleanser: Soap and Water 1 x Per Week/7 Days Discharge Instructions: May shower and wash wound with dial antibacterial soap and water prior to dressing change. Cleanser: Wound Cleanser 1 x Per Week/7 Days Discharge Instructions: Cleanse the wound with wound cleanser prior to applying a clean dressing using gauze sponges, not  tissue or cotton balls. Peri-Wound Care: Sween Lotion (Moisturizing lotion) 1 x Per Week/7 Days Discharge Instructions: Apply moisturizing lotion as directed Topical: Mupirocin Ointment 1 x Per Week/7 Days Discharge Instructions: Apply Mupirocin (Bactroban) as instructed Prim Dressing: Sorbalgon AG Dressing 2x2 (in/in) 1 x Per Week/7 Days ary Discharge Instructions: Apply to wound bed as instructed Secondary Dressing: Woven Gauze Sponge, Non-Sterile 4x4 in 1 x Per Week/7 Days Discharge  Instructions: Apply over primary dressing as directed. Compression Wrap: CoFlex TLC XL 2-layer Compression System 4x7 (in/yd) 1 x Per Week/7 Days Discharge Instructions: Apply CoFlex 2-layer with ZINC compression as directed. (alt for 4 layer) Patient Medications llergies: No Known Allergies A Notifications Medication Indication Start End 03/24/2022 lidocaine DOSE topical 5 % ointment - ointment topical Electronic Signature(s) Signed: 03/24/2022 3:46:52 PM By: Linton Ham MD Signed: 03/24/2022 3:54:33 PM By: Adline Peals Entered By: Adline Peals on 03/24/2022 08:01:47 -------------------------------------------------------------------------------- Problem List Details Patient Name: Date of Service: Marvin Atlas, [place] J. 03/24/2022 7:30 A M Medical Record Number: [id number] Patient Account Number: [id number] Date of Birth/Sex: Treating RN: 07/15/1960 (61 y.o. M) Primary Care Provider: Evonnie Dawes, Jenny Reichmann Other Clinician: Referring Provider: Treating Provider/Extender: Patton Salles in Treatment: 7 Active Problems ICD-10 Encounter Code Description Active Date MDM Diagnosis I87.331 Chronic venous hypertension (idiopathic) with ulcer and inflammation of right 02/03/2022 No Yes lower extremity L97.312 Non-pressure chronic ulcer of right ankle with fat layer exposed 02/03/2022 No Yes L97.512 Non-pressure chronic ulcer of other part of right foot with fat layer exposed 02/03/2022 No Yes [name], [name] ([id number]) [id number] Page 4 of 7 Inactive Problems Resolved Problems ICD-10 Code Description Active Date Resolved Date L97.812 Non-pressure chronic ulcer of other part of right lower leg with fat layer exposed 02/03/2022 02/03/2022 Electronic Signature(s) Signed: 03/24/2022 3:46:52 PM By: Linton Ham MD Entered By: Linton Ham on 03/24/2022  08:01:27 -------------------------------------------------------------------------------- Progress Note Details Patient Name: Date of Service: Marvin Atlas, Marvin [place] J. 03/24/2022 7:30 A M Medical Record Number: [id number] Patient Account Number: [id number] Date of Birth/Sex: Treating RN: 21-Jul-1960 (61 y.o. M) Primary Care Provider: Evonnie Dawes, Jenny Reichmann Other Clinician: Referring Provider: Treating Provider/Extender: Patton Salles in Treatment: 7 Subjective History of Present Illness (HPI) ADMISSION 05/20/2020 This is a 61 year old man who is not a diabetic however he has a long history of chronic venous insufficiency with wounds on his lower extremities. He went to the wound care center in [place] for a long period of time with recurrent wounds in these areas treated with compression wraps. He comes in today with wounds on his bilateral lower legs that he says have been there about a year on the right and about 6 months on the left. He went to see Dr. Oneida Alar on 01/17/2020 who noted that he had no history of DVT but does have a history of venous insufficiency wounds. They noted that he was considering laser ablation in October 2020 but he canceled the procedure. He had an episode of bleeding from varicose veins in 2019. He had repeated venous reflux study on 10/6 which showed no evidence of a DVT or SVT Noted that he had venous reflux in the right greater saphenous vein in the thigh and the calf on the right. Also . noted to have venous reflux in the right femoral vein right popliteal vein and right small saphenous vein. At the time in October they wanted the wounds to heal a bit he also also had a right groin wound  which is apparently closed. Not sure about the follow-up with vein and vascular. He comes in today with some light compression wraps and collagen on the wound placed by Dr. Lilia Pro who is a Education officer, environmental in [place]. He has 2 areas on the left leg 1 anteriorly and a  large area posteriorly and a smaller area on the right anterior lower leg. He has marked lower extremity edema which is nonpitting. Hemosiderin deposition on both lower legs extensively. There is dilated venules in his feet. Past medical history includes history of opioid abuse in remission, left nephrolithiasis, history of an ablation although I did not see this in Dr. Oneida Alar notes, congestive heart failure and anemia ABIs in our clinic were noncompressible bilaterally 05/27/2020; we admitted this patient to clinic last week. He has bilateral superficial venous insufficiency with bilateral wounds left greater than right. We put him in 4 layer compression with silver alginate. The he was previously cared for in the [place] wound care clinic when it was open. His wounds are better with compression this week and there is certainly less swelling. 2/21; bilateral superficial venous insufficiency wounds. A cluster on the left lateral. He has very well-defined areas on the right anterior right and right lateral. We are using silver alginate and 4-layer compression 2/28; bilateral superficial venous insufficiency wounds in the setting of significant stage III lymphedema. He has a cluster on the left which is smaller. His area on the right anterior and right lateral also look better. 3/14 superficial bilateral venous insufficiency wounds. Much better on both sides in fact the area on the right posterior is healed. He still has 2 small open areas on the right anterior and the left lateral 3/21; superficial bilateral venous insufficiency wounds. Everything is closed on the right side. Small area still remains on the left. We will transition him today to his right extremiteaze stocking. Still dressing the remaining wound on the left with compression 3/28; the patient's right leg remains closed in his wraparound stocking. Disappointingly today the area on the left lateral leg looks about the same. I thought  this might be closed with been using silver alginate 4/4; right leg remains in his wraparound stocking.. Left lateral leg measures smaller even under illumination the wound looks good. Changed him to [facility] with additional ABD compression last week 4/11; left lateral posterior very tiny superficial wound. This should be closed by next week. Using Hydrofera Blue. 4/18; left lateral wound is totally epithelialized. He has his own external compression stocking and compression pumps. READMISSION 02/03/2022 This is a 61 year old man well-known to the wound care center. He has chronic venous insufficiency and lymphedema. He has been evaluated in the past and has documented venous reflux, but thus far, has not undergone ablation. He returns to clinic today with multiple wounds on his right lower extremity. He reports that the medial lower leg wound started when something sharp stuck into his leg about a year ago. He says he was trying to treat this at home and took a course of cephalexin that he had leftover from a previous event. It did not improve and in the past 3 to 4 weeks, it has actually gotten worse, to the point that he is unable to use his lymphedema pumps secondary to pain. He has another ulcer on his left lateral ankle and one between his first and second toes, both present for about 3 to 4 weeks, per his report. On his right medial lower leg, there are 2 ulcers  with slough accumulated. The surface underneath the slough is quite fibrous, consistent with his history of multiple lower leg ulcerations. He has a triangular wound on his right lateral ankle, similar in appearance to the others. Between his first and second toes, he has [name], [name] ([id number]) [id number] Page 5 of 7 what appears to be athlete's foot, with a fungal odor and skin breakdown with eschar and slough present. 02/09/2022: On the right medial lower leg wound, there is significant slough  accumulation. The right lateral ankle wound is gray and the patient reports an increase in pain in this site. The area between his first and second toes has dried out considerably. The more proximal ulcer on his right posterior leg has some slough buildup over a fibrotic surface. Edema control is markedly improved. 02/17/2022: The right medial lower leg and right lateral ankle wound still have a lot of slough accumulation, but they are less painful. The culture that I took last week grew out multiple gram-positive species. The area between his first and second toes still has a consistent appearance with athlete's foot. The more proximal lateral lower leg wound has healed. He still has not been scheduled for his repeat venous reflux studies. 02/25/2022: He had his repeat reflux studies done but for some reason they only did the right leg. He does have evidence of reflux. In the past, bilateral studies were performed and he has significant venous reflux in both legs. His edema control is excellent this week. The areas between his toes are drying up. The right medial lower leg wound is substantially smaller with just a little bit of eschar overlying the open area. The lateral ankle wound still has some slough and eschar accumulation and remains fairly tender. 03/11/2022: The right medial leg wound has closed. The lateral ankle wound is substantially smaller with just some slough and eschar present. It is tender, but less so than on previous visits. He is still having some issues with moisture between his toes and athlete's foot. His wrap slipped and the area above the wrap is markedly swollen, well the areas that was still contained in the wrap has excellent edema control. 03/18/2022: The lateral ankle wound has contracted further with just slough and eschar present. He is not experiencing any pain in the location. His interdigital tinea pedis has responded well to over-the-counter antifungal spray powder.  We had him in a zinc-based Coflex wrap and this stayed up well; edema control is much better this week. 12/12; only a small open wound remains on the right lateral ankle. The medial ankle is closed. The interdigital is a a result of tinea and that is closed as well. Primary dressing is silver alginate under compression. The patient is not using his compression pumps that this area. He has some form of compression stocking on the left leg that he was also wearing on the right at the time this wound formed although I am not sure of the compression. He brought these on [employer] Constitutional Vitals Time Taken: 7:43 AM, Height: 71 in, Weight: 230 lbs, BMI: 32.1, Temperature: 98.3 F, Pulse: 62 bpm, Respiratory Rate: 18 breaths/min, Blood Pressure: 109/70 mmHg. Integumentary (Hair, Skin) Wound #6 status is Open. Original cause of wound was Gradually Appeared. The date acquired was: 01/11/2022. The wound has been in treatment 7 weeks. The wound is located on the Right T - Web between 1st and 2nd. The wound measures 0cm length x 0cm width x 0cm depth; 0cm^2 area and 0cm^3  volume. oe There is no tunneling or undermining noted. There is a none present amount of drainage noted. The wound margin is flat and intact. There is no granulation within the wound bed. There is no necrotic tissue within the wound bed. The periwound skin appearance had no abnormalities noted for moisture. The periwound skin appearance did not exhibit: Callus, Crepitus, Excoriation, Induration, Rash, Scarring, Atrophie Blanche, Cyanosis, Ecchymosis, Hemosiderin Staining, Mottled, Pallor, Rubor, Erythema. Periwound temperature was noted as No Abnormality. The periwound has tenderness on palpation. Wound #7 status is Open. Original cause of wound was Gradually Appeared. The date acquired was: 01/11/2022. The wound has been in treatment 7 weeks. The wound is located on the Right,Lateral Ankle. The wound measures 0.2cm length x  0.4cm width x 0.1cm depth; 0.063cm^2 area and 0.006cm^3 volume. There is Fat Layer (Subcutaneous Tissue) exposed. There is no tunneling or undermining noted. There is a medium amount of serosanguineous drainage noted. The wound margin is distinct with the outline attached to the wound base. There is large (67-100%) red, pink granulation within the wound bed. There is no necrotic tissue within the wound bed. The periwound skin appearance did not exhibit: Callus, Crepitus, Excoriation, Induration, Rash, Scarring, Dry/Scaly, Maceration, Atrophie Blanche, Cyanosis, Ecchymosis, Hemosiderin Staining, Mottled, Pallor, Rubor, Erythema. Periwound temperature was noted as No Abnormality. The periwound has tenderness on palpation. 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 There was a Double Layer Compression Therapy Procedure by Adline Peals, RN. Post procedure Diagnosis Wound #7: Same as Pre-Procedure Plan [name], [name] ([id number]) [id number] Page 6 of 7 Follow-up Appointments: Return Appointment in 1 week. - Dr. Celine Ahr rm 4 Other: - [facility]in [facility] V[facility] Speak with Damita Dunnings about venous studies phone [phone]. Anesthetic: (In clinic) Topical Lidocaine 5% applied to wound bed Bathing/ Shower/ Hygiene: May shower with protection but do not get wound dressing(s) wet. Edema Control - Lymphedema / SCD / Other: Lymphedema Pumps. Use Lymphedema pumps on leg(s) 2-3 times a day for 45-60 minutes. If wearing any wraps or hose, do not remove them. Continue exercising as instructed. Elevate legs to the level of the heart or above for 30  minutes daily and/or when sitting, a frequency of: Avoid standing for long periods of time. Exercise regularly Compression stocking or Garment 20-30 mm/Hg pressure to: - toleftleg daily The following medication(s) was prescribed: lidocaine topical 5 % ointment ointment topical was prescribed at facility WOUND #7: - Ankle Wound Laterality: Right, Lateral Cleanser: Soap and Water 1 x Per Week/7 Days Discharge Instructions: May shower and wash wound with dial antibacterial soap and water prior to dressing change. Cleanser: Wound Cleanser 1 x Per Week/7 Days Discharge Instructions: Cleanse the wound with wound cleanser prior to applying a clean dressing using gauze sponges, not tissue or cotton balls. Peri-Wound Care: Sween Lotion (Moisturizing lotion) 1 x Per Week/7 Days Discharge Instructions: Apply moisturizing lotion as directed Topical: Mupirocin Ointment 1 x Per Week/7 Days Discharge Instructions: Apply Mupirocin (Bactroban) as instructed Prim Dressing: Sorbalgon AG Dressing 2x2 (in/in) 1 x Per Week/7 Days ary Discharge Instructions: Apply to wound bed as instructed Secondary Dressing: Woven Gauze Sponge, Non-Sterile 4x4 in 1 x Per Week/7 Days Discharge Instructions: Apply over primary dressing as  directed. Com pression Wrap: CoFlex TLC XL 2-layer Compression System 4x7 (in/yd) 1 x Per Week/7 Days Discharge Instructions: Apply CoFlex 2-layer with ZINC compression as directed. (alt for 4 layer) 1. There is no need to change the primary dressing the wound is smaller and the base of this looks healthier. No debridement was necessary 2. I have asked him to use his compression pumps over our compression wrap to help control the edema in the upper half of the lower leg. 3. I have asked him to find out what degree of compression his stockings are. He has 1 on the left and was wearing it on the right at the time this wound opened. He may need more aggressive compression in terms of his stockings. I  did talk about at least daily use of the compression pumps Electronic Signature(s) Signed: 03/24/2022 3:46:52 PM By: Linton Ham MD Signed: 03/24/2022 3:54:33 PM By: Adline Peals Entered By: Adline Peals on 03/24/2022 08:17:42 -------------------------------------------------------------------------------- SuperBill Details Patient Name: Date of Service: Marvin Atlas, Marvin UL J. 03/24/2022 Medical Record Number: [id number] Patient Account Number: [id number] Date of Birth/Sex: Treating RN: [birth year] (61 y.o. M) Primary Care Provider: Evonnie Dawes, Jenny Reichmann Other Clinician: Referring Provider: Treating Provider/Extender: Patton Salles in Treatment: 7 Diagnosis Coding ICD-10 Codes Code Description [phone] Chronic venous hypertension (idiopathic) with ulcer and inflammation of right lower extremity 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 Facility Procedures Physician Procedures : CPT4 Code Description Modifier [id number] - WC PHYS LEVEL 3 - EST PT ICD-10 Diagnosis Description I87.331 Chronic venous hypertension (idiopathic) with ulcer and inflammation of right lower extremity L97.312 Non-pressure chronic ulcer of right ankle  with fat layer exposed Quantity: 1 Electronic Signature(s) Signed: 03/24/2022 3:46:52 PM By: Linton Ham MD Signed: 03/24/2022 3:54:33 PM By: Adline Peals Entered By: Adline Peals on 03/24/2022 08:17:54

## 2022-03-27 ENCOUNTER — Encounter: Payer: Self-pay | Admitting: Cardiology

## 2022-03-31 ENCOUNTER — Encounter (HOSPITAL_BASED_OUTPATIENT_CLINIC_OR_DEPARTMENT_OTHER): Payer: PPO | Admitting: General Surgery

## 2022-03-31 DIAGNOSIS — I87331 Chronic venous hypertension (idiopathic) with ulcer and inflammation of right lower extremity: Secondary | ICD-10-CM | POA: Diagnosis not present

## 2022-03-31 DIAGNOSIS — I872 Venous insufficiency (chronic) (peripheral): Secondary | ICD-10-CM | POA: Diagnosis not present

## 2022-03-31 DIAGNOSIS — L97312 Non-pressure chronic ulcer of right ankle with fat layer exposed: Secondary | ICD-10-CM | POA: Diagnosis not present

## 2022-03-31 NOTE — Progress Notes (Signed)
EKAM, BESSON ([id number]) [id number] Page 1 of 11 Visit Report for 03/31/2022 Chief Complaint Document Details Patient Name: Date of Service: [name], [name] 03/31/2022 7:30 A M Medical Record Number: [id number] Patient Account Number: [id number] Date of Birth/Sex: Treating RN: 14-Oct-1960 (61 y.o. M) Primary Care Provider: Evonnie Dawes, Jenny Reichmann Other Clinician: Referring Provider: Treating Provider/Extender: Evette [name] in Treatment: 8 Information Obtained from: Patient Chief Complaint 05/20/2020; patient is here for review of wounds on his bilateral lower legs in the setting of severe chronic venous insufficiency with lymphedema 02/03/2022: returns to clinic today with new wounds on RLE Electronic Signature(s) Signed: 03/31/2022 8:02:46 AM By: Fredirick Maudlin MD FACS Entered By: Fredirick Maudlin on 03/31/2022 08:02:46 -------------------------------------------------------------------------------- Debridement Details Patient Name: Date of Service: [name] 03/31/2022 7:30 A M Medical Record Number: [id number] Patient Account Number: [id number] Date of Birth/Sex: Treating RN: October 31, 1960 (61 y.o. [name] in Treatment: 8 Debridement Performed for Assessment: Wound #7 Right,Lateral Ankle Performed By: Physician Fredirick Maudlin, MD Debridement Type: Debridement Severity of Tissue Pre Debridement: Fat layer exposed Level of Consciousness (Pre-procedure): Awake and Alert Pre-procedure Verification/Time Out Yes - 07:52 Taken: Start Time: 07:52 Pain Control: Lidocaine 4% T opical Solution T Area Debrided (L x W): otal 0.3 (cm) x 0.3 (cm) = 0.09 (cm) Tissue and other material debrided: Non-Viable, Eschar, Slough, Slough Level: Non-Viable Tissue Debridement  Description: Selective/Open Wound Instrument: Curette Bleeding: Minimum Hemostasis Achieved: Pressure Response to Treatment: Procedure was tolerated well Level of Consciousness (Post- Awake and Alert procedure): Post Debridement Measurements of Total Wound Length: (cm) 0.1 Width: (cm) 0.1 Depth: (cm) 0.1 Volume: (cm) 0.001 Character of Wound/Ulcer Post Debridement: Improved Severity of Tissue Post Debridement: Fat layer exposed [name], [name] ([id number]) [id number] Page 2 of 11 Post Procedure Diagnosis Same as Pre-procedure Notes scribed for Dr. Celine Ahr by Adline Peals, RN Electronic Signature(s) Signed: 03/31/2022 11:22:41 AM By: Fredirick Maudlin MD FACS Signed: 03/31/2022 3:57:12 PM By: Adline Peals Entered By: Adline Peals on 03/31/2022 07:53:25 -------------------------------------------------------------------------------- HPI Details Patient Name: Date of Service: [name] 03/31/2022 7:30 A M Medical Record Number: [id number] Patient Account Number: [id number] Date of Birth/Sex: Treating RN: 06-20-1960 (61 y.o. M) Primary Care Provider: Evonnie Dawes, Jenny Reichmann Other Clinician: Referring Provider: Treating Provider/Extender: Evette [name] in Treatment: 8 History of Present Illness HPI Description: ADMISSION 05/20/2020 This is a 61 year old man who is not a diabetic however he has a long history of chronic venous insufficiency with wounds on his lower extremities. He went to the wound care center in [place] for a long period of time with recurrent wounds in these areas treated with compression wraps. He comes in today with wounds on his bilateral lower legs that he says have been there about a year on the right and about 6 months on the left. He went to see Dr. Oneida Alar on 01/17/2020 who noted that he had no history of DVT but does have a history of venous insufficiency wounds. They noted that he was  considering laser ablation in October 2020 but he canceled the procedure. He had an episode of bleeding from varicose veins in 2019. He had repeated venous reflux study on 10/6 which showed no evidence of a DVT or SVT Noted that he had venous reflux in the right greater saphenous vein in the thigh and the calf  on the right. Also . noted to have venous reflux in the right femoral vein right popliteal vein and right small saphenous vein. At the time in October they wanted the wounds to heal a bit he also also had a right groin wound which is apparently closed. Not sure about the follow-up with vein and vascular. He comes in today with some light compression wraps and collagen on the wound placed by Dr. Lilia Pro who is a Education officer, environmental in [place]. He has 2 areas on the left leg 1 anteriorly and a large area posteriorly and a smaller area on the right anterior lower leg. He has marked lower extremity edema which is nonpitting. Hemosiderin deposition on both lower legs extensively. There is dilated venules in his feet. Past medical history includes history of opioid abuse in remission, left nephrolithiasis, history of an ablation although I did not see this in Dr. Oneida Alar notes, congestive heart failure and anemia ABIs in our clinic were noncompressible bilaterally 05/27/2020; we admitted this patient to clinic last week. He has bilateral superficial venous insufficiency with bilateral wounds left greater than right. We put him in 4 layer compression with silver alginate. The he was previously cared for in the [place] wound care clinic when it was open. His wounds are better with compression this week and there is certainly less swelling. 2/21; bilateral superficial venous insufficiency wounds. A cluster on the left lateral. He has very well-defined areas on the right anterior right and right lateral. We are using silver alginate and 4-layer compression 2/28; bilateral superficial venous insufficiency  wounds in the setting of significant stage III lymphedema. He has a cluster on the left which is smaller. His area on the right anterior and right lateral also look better. 3/14 superficial bilateral venous insufficiency wounds. Much better on both sides in fact the area on the right posterior is healed. He still has 2 small open areas on the right anterior and the left lateral 3/21; superficial bilateral venous insufficiency wounds. Everything is closed on the right side. Small area still remains on the left. We will transition him today to his right extremiteaze stocking. Still dressing the remaining wound on the left with compression 3/28; the patient's right leg remains closed in his wraparound stocking. Disappointingly today the area on the left lateral leg looks about the same. I thought this might be closed with been using silver alginate 4/4; right leg remains in his wraparound stocking.. Left lateral leg measures smaller even under illumination the wound looks good. Changed him to [facility] with additional ABD compression last week 4/11; left lateral posterior very tiny superficial wound. This should be closed by next week. Using Hydrofera Blue. 4/18; left lateral wound is totally epithelialized. He has his own external compression stocking and compression pumps. READMISSION 02/03/2022 This is a 61 year old man well-known to the wound care center. He has chronic venous insufficiency and lymphedema. He has been evaluated in the past and has documented venous reflux, but thus far, has not undergone ablation. He returns to clinic today with multiple wounds on his right lower extremity. He reports that the medial lower leg wound started when something sharp stuck into his leg about a year ago. He says he was trying to treat this at home and took a course of cephalexin that he had leftover from a previous event. It did not improve and in the past 3 to 4 weeks, it has actually gotten worse,  to the point that he is unable to use  his lymphedema pumps secondary to pain. He has another ulcer on his left lateral ankle and one between his first and second toes, both present for about 3 to 4 weeks, per his report. On his right medial lower leg, there are 2 ulcers with slough accumulated. The surface underneath the slough is quite fibrous, consistent with his history of multiple lower leg ulcerations. He has a triangular wound on his right lateral ankle, similar in appearance to the others. Between his first and second toes, he has what appears to be athlete's foot, with a fungal odor and skin breakdown with eschar and slough present. 02/09/2022: On the right medial lower leg wound, there is significant slough accumulation. The right lateral ankle wound is gray and the patient reports an increase in pain in this site. The area between his first and second toes has dried out considerably. The more proximal ulcer on his right posterior leg has some KEARNEY, [name] ([id number]) [id number] Page 3 of 11 slough buildup over a fibrotic surface. Edema control is markedly improved. 02/17/2022: The right medial lower leg and right lateral ankle wound still have a lot of slough accumulation, but they are less painful. The culture that I took last week grew out multiple gram-positive species. The area between his first and second toes still has a consistent appearance with athlete's foot. The more proximal lateral lower leg wound has healed. He still has not been scheduled for his repeat venous reflux studies. 02/25/2022: He had his repeat reflux studies done but for some reason they only did the right leg. He does have evidence of reflux. In the past, bilateral studies were performed and he has significant venous reflux in both legs. His edema control is excellent this week. The areas between his toes are drying up. The right medial lower leg wound is substantially smaller with  just a little bit of eschar overlying the open area. The lateral ankle wound still has some slough and eschar accumulation and remains fairly tender. 03/11/2022: The right medial leg wound has closed. The lateral ankle wound is substantially smaller with just some slough and eschar present. It is tender, but less so than on previous visits. He is still having some issues with moisture between his toes and athlete's foot. His wrap slipped and the area above the wrap is markedly swollen, well the areas that was still contained in the wrap has excellent edema control. 03/18/2022: The lateral ankle wound has contracted further with just slough and eschar present. He is not experiencing any pain in the location. His interdigital tinea pedis has responded well to over-the-counter antifungal spray powder. We had him in a zinc-based Coflex wrap and this stayed up well; edema control is much better this week. 12/12; only a small open wound remains on the right lateral ankle. The medial ankle is closed. The interdigital is a a result of tinea and that is closed as well. Primary dressing is silver alginate under compression. The patient is not using his compression pumps that this area. He has some form of compression stocking on the left leg that he was also wearing on the right at the time this wound formed although I am not sure of the compression. He brought these on [facility] 03/31/2022: The lateral ankle wound remains open under a layer of eschar. It is small. Edema control is excellent. The interdigital areas have closed and remain dry with his use of the over-the-counter spray powder. Electronic Signature(s) Signed: 03/31/2022 8:04:21 AM By: Celine Ahr,  Anderson Malta MD FACS Entered By: Fredirick Maudlin on 03/31/2022 08:04:21 -------------------------------------------------------------------------------- Physical Exam Details Patient Name: Date of Service: [name], Marvin UL [name] 03/31/2022 7:30 A M Medical Record  Number: [id number] Patient Account Number: [id number] Date of Birth/Sex: Treating RN: 05/28/1960 (61 y.o. M) Primary Care Provider: Evonnie Dawes, Jenny Reichmann Other Clinician: Referring Provider: Treating Provider/Extender: Evette [name] in Treatment: 8 Constitutional . . . . No acute distress. Respiratory Normal work of breathing on room air. Notes 03/31/2022: The lateral ankle wound remains open under a layer of eschar. It is small. Edema control is excellent. The interdigital areas have closed and remain dry with his use of the over-the-counter spray powder. Electronic Signature(s) Signed: 03/31/2022 8:04:56 AM By: Fredirick Maudlin MD FACS Entered By: Fredirick Maudlin on 03/31/2022 08:04:56 -------------------------------------------------------------------------------- Physician Orders Details Patient Name: Date of Service: [name] 03/31/2022 7:30 A M Medical Record Number: [id number] Patient Account Number: [id number] Date of Birth/Sex: Treating RN: Nov 27, 1960 (61 y.o. [name] in Treatment: [address] [name], [name] ([id number]) [id number] Page 4 of 11 Verbal / Phone Orders: No Diagnosis Coding ICD-10 Coding Code Description I87.331 Chronic venous hypertension (idiopathic) with ulcer and inflammation of right lower extremity 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 Follow-up Appointments ppointment in 1 week. - Dr. Celine Ahr rm 4 Return A Other: - Vein and Vascular Speak with Damita Dunnings about venous studies phone [phone]. Anesthetic (In clinic) Topical Lidocaine 4% applied to wound bed Bathing/ Shower/ Hygiene May shower with protection but do not get wound  dressing(s) wet. Edema Control - Lymphedema / SCD / Other Lymphedema Pumps. Use Lymphedema pumps on leg(s) 2-3 times a day for 45-60 minutes. If wearing any wraps or hose, do not remove them. Continue exercising as instructed. Elevate legs to the level of the heart or above for 30 minutes daily and/or when sitting, a frequency of: Avoid standing for long periods of time. Exercise regularly Compression stocking or Garment 20-30 mm/Hg pressure to: - toleftleg daily Wound Treatment Wound #7 - Ankle Wound Laterality: Right, Lateral Cleanser: Soap and Water 1 x Per Week/7 Days Discharge Instructions: May shower and wash wound with dial antibacterial soap and water prior to dressing change. Cleanser: Wound Cleanser 1 x Per Week/7 Days Discharge Instructions: Cleanse the wound with wound cleanser prior to applying a clean dressing using gauze sponges, not tissue or cotton balls. Peri-Wound Care: Sween Lotion (Moisturizing lotion) 1 x Per Week/7 Days Discharge Instructions: Apply moisturizing lotion as directed Topical: Mupirocin Ointment 1 x Per Week/7 Days Discharge Instructions: Apply Mupirocin (Bactroban) as instructed Prim Dressing: Promogran Prisma Matrix, 4.34 (sq in) (silver collagen) 1 x Per Week/7 Days ary Discharge Instructions: Moisten collagen with saline or hydrogel Secondary Dressing: Woven Gauze Sponge, Non-Sterile 4x4 in 1 x Per Week/7 Days Discharge Instructions: Apply over primary dressing as directed. Compression Wrap: CoFlex TLC XL 2-layer Compression System 4x7 (in/yd) 1 x Per Week/7 Days Discharge Instructions: Apply CoFlex 2-layer with ZINC compression as directed. (alt for 4 layer) Patient Medications llergies: No Known Allergies A Notifications Medication Indication Start End 03/31/2022 lidocaine DOSE topical 4 % cream - cream topical Electronic Signature(s) Signed: 03/31/2022 11:22:41 AM By: Fredirick Maudlin MD FACS Entered By: Fredirick Maudlin on 03/31/2022  08:05:07 Problem List Details -------------------------------------------------------------------------------- [name] ([id number]) [id number] Page 5 of 11 Patient Name: Date of Service: [name], [name] 03/31/2022 7:30 A M Medical Record Number: [id number] Patient Account Number: [id number] Date of Birth/Sex: Treating RN: 06/17/1960 (61 y.o. 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 03/31/2022 7:30 A M Medical Record Number: [id number] Patient Account Number: [id number] Date of Birth/Sex: Treating RN: July 17, 1960 (61 y.o. M) Primary Care Provider: Evonnie Dawes, Jenny Reichmann Other Clinician: Referring Provider: Treating Provider/Extender: Evette [name] in Treatment: 8 Subjective Chief Complaint Information obtained from Patient 05/20/2020; patient is here for review of wounds on his bilateral lower legs in the setting of severe chronic venous  insufficiency with lymphedema 02/03/2022: returns to clinic today with new wounds on RLE History of Present Illness (HPI) ADMISSION 05/20/2020 This is a 61 year old man who is not a diabetic however he has a long history of chronic venous insufficiency with wounds on his lower extremities. He went to the wound care center in [place] for a long period of time with recurrent wounds in these areas treated with compression wraps. He comes in today with wounds on his bilateral lower legs that he says have been there about a year on the right and about 6 months on the left. He went to see Dr. Oneida Alar on 01/17/2020 who noted that he had no history of DVT but does have a history of venous insufficiency wounds. They noted that he was considering laser ablation in October 2020 but he canceled the procedure. He had an episode of bleeding from varicose veins in 2019. He had repeated venous reflux study on 10/6 which showed no evidence of a DVT or SVT Noted that he had venous reflux in the right greater saphenous vein in the thigh and the calf on the right. Also . noted to have venous reflux in the right femoral vein right popliteal vein and right small saphenous vein. At the time in October they wanted the wounds to heal [name], [name] ([id number]) [id number] Page 6 of 11 a bit he also also had a right groin wound which is apparently closed. Not sure about the follow-up with vein and vascular. He comes in today with some light compression wraps and collagen on the wound placed by Dr. Lilia Pro who is a Education officer, environmental in [place]. He has 2 areas on the left leg 1 anteriorly and a large area posteriorly and a smaller area on the right anterior lower leg. He has marked lower extremity edema which is nonpitting. Hemosiderin deposition on both lower legs extensively. There is dilated venules in his feet. Past medical history includes history of opioid abuse in remission, left nephrolithiasis,  history of an ablation although I did not see this in Dr. Oneida Alar  notes, congestive heart failure and anemia ABIs in our clinic were noncompressible bilaterally 05/27/2020; we admitted this patient to clinic last week. He has bilateral superficial venous insufficiency with bilateral wounds left greater than right. We put him in 4 layer compression with silver alginate. The he was previously cared for in the [place] wound care clinic when it was open. His wounds are better with compression this week and there is certainly less swelling. 2/21; bilateral superficial venous insufficiency wounds. A cluster on the left lateral. He has very well-defined areas on the right anterior right and right lateral. We are using silver alginate and 4-layer compression 2/28; bilateral superficial venous insufficiency wounds in the setting of significant stage III lymphedema. He has a cluster on the left which is smaller. His area on the right anterior and right lateral also look better. 3/14 superficial bilateral venous insufficiency wounds. Much better on both sides in fact the area on the right posterior is healed. He still has 2 small open areas on the right anterior and the left lateral 3/21; superficial bilateral venous insufficiency wounds. Everything is closed on the right side. Small area still remains on the left. We will transition him today to his right extremiteaze stocking. Still dressing the remaining wound on the left with compression 3/28; the patient's right leg remains closed in his wraparound stocking. Disappointingly today the area on the left lateral leg looks about the same. I thought this might be closed with been using silver alginate 4/4; right leg remains in his wraparound stocking.. Left lateral leg measures smaller even under illumination the wound looks good. Changed him to [facility] with additional ABD compression last week 4/11; left lateral posterior very tiny superficial wound.  This should be closed by next week. Using Hydrofera Blue. 4/18; left lateral wound is totally epithelialized. He has his own external compression stocking and compression pumps. READMISSION 02/03/2022 This is a 61 year old man well-known to the wound care center. He has chronic venous insufficiency and lymphedema. He has been evaluated in the past and has documented venous reflux, but thus far, has not undergone ablation. He returns to clinic today with multiple wounds on his right lower extremity. He reports that the medial lower leg wound started when something sharp stuck into his leg about a year ago. He says he was trying to treat this at home and took a course of cephalexin that he had leftover from a previous event. It did not improve and in the past 3 to 4 weeks, it has actually gotten worse, to the point that he is unable to use his lymphedema pumps secondary to pain. He has another ulcer on his left lateral ankle and one between his first and second toes, both present for about 3 to 4 weeks, per his report. On his right medial lower leg, there are 2 ulcers with slough accumulated. The surface underneath the slough is quite fibrous, consistent with his history of multiple lower leg ulcerations. He has a triangular wound on his right lateral ankle, similar in appearance to the others. Between his first and second toes, he has what appears to be athlete's foot, with a fungal odor and skin breakdown with eschar and slough present. 02/09/2022: On the right medial lower leg wound, there is significant slough accumulation. The right lateral ankle wound is gray and the patient reports an increase in pain in this site. The area between his first and second toes has dried out considerably. The more proximal ulcer on his right  posterior leg has some slough buildup over a fibrotic surface. Edema control is markedly improved. 02/17/2022: The right medial lower leg and right lateral ankle wound still have  a lot of slough accumulation, but they are less painful. The culture that I took last week grew out multiple gram-positive species. The area between his first and second toes still has a consistent appearance with athlete's foot. The more proximal lateral lower leg wound has healed. He still has not been scheduled for his repeat venous reflux studies. 02/25/2022: He had his repeat reflux studies done but for some reason they only did the right leg. He does have evidence of reflux. In the past, bilateral studies were performed and he has significant venous reflux in both legs. His edema control is excellent this week. The areas between his toes are drying up. The right medial lower leg wound is substantially smaller with just a little bit of eschar overlying the open area. The lateral ankle wound still has some slough and eschar accumulation and remains fairly tender. 03/11/2022: The right medial leg wound has closed. The lateral ankle wound is substantially smaller with just some slough and eschar present. It is tender, but less so than on previous visits. He is still having some issues with moisture between his toes and athlete's foot. His wrap slipped and the area above the wrap is markedly swollen, well the areas that was still contained in the wrap has excellent edema control. 03/18/2022: The lateral ankle wound has contracted further with just slough and eschar present. He is not experiencing any pain in the location. His interdigital tinea pedis has responded well to over-the-counter antifungal spray powder. We had him in a zinc-based Coflex wrap and this stayed up well; edema control is much better this week. 12/12; only a small open wound remains on the right lateral ankle. The medial ankle is closed. The interdigital is a a result of tinea and that is closed as well. Primary dressing is silver alginate under compression. The patient is not using his compression pumps that this area. He has some  form of compression stocking on the left leg that he was also wearing on the right at the time this wound formed although I am not sure of the compression. He brought these on [facility] 03/31/2022: The lateral ankle wound remains open under a layer of eschar. It is small. Edema control is excellent. The interdigital areas have closed and remain dry with his use of the over-the-counter spray powder. Patient History Information obtained from Patient. Family History Cancer, Diabetes - Father, Hypertension - Father, No family history of Heart Disease, Hereditary Spherocytosis, Kidney Disease, Lung Disease, Seizures, Stroke, Thyroid Problems, Tuberculosis. Social History Current every day smoker, Marital Status - Divorced, Alcohol Use - Never, Drug Use - Prior History, Caffeine Use - Rarely. Medical History Eyes Denies history of Cataracts, Glaucoma, Optic Neuritis Ear/Nose/Mouth/Throat Denies history of Chronic sinus problems/congestion, Middle ear problems Hematologic/Lymphatic Patient has history of Anemia Denies history of Hemophilia, Human Immunodeficiency Virus, Lymphedema, Sickle Cell Disease Respiratory Patient has history of Sleep Apnea Denies history of Aspiration, Asthma, Chronic Obstructive Pulmonary Disease (COPD), Pneumothorax, Tuberculosis Cardiovascular [name], [name] ([id number]) [id number] Page 7 of 11 Patient has history of Congestive Heart Failure, Coronary Artery Disease, Peripheral Venous Disease Denies history of Angina, Arrhythmia, Deep Vein Thrombosis, Hypertension, Hypotension, Myocardial Infarction, Peripheral Arterial Disease, Phlebitis, Vasculitis Gastrointestinal Denies history of Cirrhosis , Colitis, Crohnoos, Hepatitis A, Hepatitis B, Hepatitis C Endocrine Denies history of Type I Diabetes,  Type II Diabetes Genitourinary Denies history of End Stage Renal Disease Immunological Denies history of Lupus Erythematosus, Raynaudoos,  Scleroderma Integumentary (Skin) Denies history of History of Burn Musculoskeletal Patient has history of Osteoarthritis Denies history of Gout, Rheumatoid Arthritis, Osteomyelitis Neurologic Denies history of Dementia, Neuropathy, Quadriplegia, Paraplegia, Seizure Disorder Oncologic Denies history of Received Chemotherapy, Received Radiation Psychiatric Denies history of Anorexia/bulimia, Confinement Anxiety Hospitalization/Surgery History - right hip nailing 10/17/2019. - 05/17/2018 nephrolithotomy left. - 01/29/2017 right lymph noes biopsy. - cystoscopy 2017, 2018. - 2017 gastric bypass surgery. Medical A Surgical History Notes nd Gastrointestinal gastric bypass Objective Constitutional No acute distress. Vitals Time Taken: 7:43 AM, Height: 71 in, Weight: 230 lbs, BMI: 32.1, Temperature: 97.7 F, Pulse: 61 bpm, Respiratory Rate: 18 breaths/min, Blood Pressure: 110/73 mmHg. Respiratory Normal work of breathing on room air. General Notes: 03/31/2022: The lateral ankle wound remains open under a layer of eschar. It is small. Edema control is excellent. The interdigital areas have closed and remain dry with his use of the over-the-counter spray powder. Integumentary (Hair, Skin) Wound #7 status is Open. Original cause of wound was Gradually Appeared. The date acquired was: 01/11/2022. The wound has been in treatment 8 weeks. The wound is located on the Right,Lateral Ankle. The wound measures 0.1cm length x 0.1cm width x 0.1cm depth; 0.008cm^2 area and 0.001cm^3 volume. There is no tunneling or undermining noted. There is a none present amount of drainage noted. The wound margin is distinct with the outline attached to the wound base. There is no granulation within the wound bed. There is a large (67-100%) amount of necrotic tissue within the wound bed including Eschar. The periwound skin appearance had no abnormalities noted for texture. The periwound skin appearance had no abnormalities  noted for moisture. The periwound skin appearance had no abnormalities noted for color. Periwound temperature was noted as No Abnormality. The periwound has tenderness on palpation. 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 There was a Selective/Open Wound Non-Viable Tissue Debridement with a total area of 0.09 sq cm performed by Fredirick Maudlin, MD. With the following instrument(s): Curette to remove Non-Viable tissue/material. Material removed includes Eschar and Slough and after achieving pain control using Lidocaine 4% Topical Solution. No specimens were taken. A time out was conducted at 07:52, prior to the start of the procedure. A Minimum amount of bleeding was [name], [name] ([id number]) [id number] Page 8 of 11 controlled with Pressure. The procedure was tolerated well. Post Debridement Measurements: 0.1cm length x 0.1cm width x 0.1cm depth; 0.001cm^3 volume. Character of Wound/Ulcer Post Debridement is improved. Severity of Tissue Post Debridement is: Fat layer exposed. Post procedure Diagnosis Wound #7: Same as Pre-Procedure General Notes: scribed for Dr. Celine Ahr by Adline Peals, RN. Pre-procedure diagnosis of Wound #7 is a Venous Leg Ulcer located on the Right,Lateral Ankle . There was a Double Layer Compression Therapy Procedure by Adline Peals, RN. Post procedure Diagnosis Wound #7: Same as Pre-Procedure Plan Follow-up Appointments: Return Appointment in 1 week. - Dr. Celine Ahr rm 4 Other: - Vein and Vascular Speak with Damita Dunnings about venous studies  phone [phone]. Anesthetic: (In clinic) Topical Lidocaine 4% applied to wound bed Bathing/  Shower/ Hygiene: May shower with protection but do not get wound dressing(s) wet. Edema Control - Lymphedema / SCD / Other: Lymphedema Pumps. Use Lymphedema pumps on leg(s) 2-3 times a day for 45-60 minutes. If wearing any wraps or hose, do not remove them. Continue exercising as instructed. Elevate legs to the level of the heart or above for 30 minutes daily and/or when sitting, a frequency of: Avoid standing for long periods of time. Exercise regularly Compression stocking or Garment 20-30 mm/Hg pressure to: - toleftleg daily The following medication(s) was prescribed: lidocaine topical 4 % cream cream topical was prescribed at facility WOUND #7: - Ankle Wound Laterality: Right, Lateral Cleanser: Soap and Water 1 x Per Week/7 Days Discharge Instructions: May shower and wash wound with dial antibacterial soap and water prior to dressing change. Cleanser: Wound Cleanser 1 x Per Week/7 Days Discharge Instructions: Cleanse the wound with wound cleanser prior to applying a clean dressing using gauze sponges, not tissue or cotton balls. Peri-Wound Care: Sween Lotion (Moisturizing lotion) 1 x Per Week/7 Days Discharge Instructions: Apply moisturizing lotion as directed Topical: Mupirocin Ointment 1 x Per Week/7 Days Discharge Instructions: Apply Mupirocin (Bactroban) as instructed Prim Dressing: Promogran Prisma Matrix, 4.34 (sq in) (silver collagen) 1 x Per Week/7 Days ary Discharge Instructions: Moisten collagen with saline or hydrogel Secondary Dressing: Woven Gauze Sponge, Non-Sterile 4x4 in 1 x Per Week/7 Days Discharge Instructions: Apply over primary dressing as directed. Com pression Wrap: CoFlex TLC XL 2-layer Compression System 4x7 (in/yd) 1 x Per Week/7 Days Discharge Instructions: Apply CoFlex 2-layer with ZINC compression as directed. (alt for 4 layer) 03/31/2022: The lateral ankle  wound remains open under a layer of eschar. It is small. Edema control is excellent. The interdigital areas have closed and remain dry with his use of the over-the-counter spray powder. I used a curette to debride eschar and slough from the lateral ankle wound. I was a little bit more aggressive this week in an effort to stimulate healing cascade. I am also going to change the contact layer to silver collagen. We will continue topical mupirocin and the zinc-based Coflex wrap. Follow-up in 1 week. Electronic Signature(s) Signed: 03/31/2022 8:05:50 AM By: Fredirick Maudlin MD FACS Entered By: Fredirick Maudlin on 03/31/2022 08:05:50 -------------------------------------------------------------------------------- HxROS Details Patient Name: Date of Service: [name] 03/31/2022 7:30 A M Medical Record Number: [id number] Patient Account Number: [id number] Date of Birth/Sex: Treating RN: 1961-03-12 (61 y.o. M) Primary Care Provider: Evonnie Dawes, Jenny Reichmann Other Clinician: Referring Provider: Treating Provider/Extender: Evette [name] in Treatment: 8 Information Obtained From Patient [name], [name] ([id number]) [id number] Page 9 of 11 Eyes Medical History: Negative for: Cataracts; Glaucoma; Optic Neuritis Ear/Nose/Mouth/Throat Medical History: Negative for: Chronic sinus problems/congestion; Middle ear problems Hematologic/Lymphatic Medical History: Positive for: Anemia Negative for: Hemophilia; Human Immunodeficiency Virus; Lymphedema; Sickle Cell Disease Respiratory Medical History: Positive for: Sleep Apnea Negative for: Aspiration; Asthma; Chronic Obstructive Pulmonary Disease (COPD); Pneumothorax; Tuberculosis Cardiovascular Medical History: Positive for: Congestive Heart Failure; Coronary Artery Disease; Peripheral Venous Disease Negative for: Angina; Arrhythmia; Deep Vein Thrombosis; Hypertension; Hypotension; Myocardial  Infarction; Peripheral Arterial Disease; Phlebitis; Vasculitis Gastrointestinal Medical History: Negative for: Cirrhosis ; Colitis; Crohns; Hepatitis A; Hepatitis B; Hepatitis C Past Medical History Notes: gastric bypass Endocrine Medical History: Negative for: Type I Diabetes; Type II Diabetes Genitourinary Medical History: Negative for: End Stage Renal Disease Immunological Medical History: Negative for: Lupus Erythematosus; Raynauds; Scleroderma Integumentary (Skin) Medical History: Negative for: History of Burn Musculoskeletal  Medical History: Positive for: Osteoarthritis Negative for: Gout; Rheumatoid Arthritis; Osteomyelitis Neurologic Medical History: Negative for: Dementia; Neuropathy; Quadriplegia; Paraplegia; Seizure Disorder Oncologic Medical History: Negative for: Received Chemotherapy; Received Radiation Psychiatric Medical History: Negative for: Anorexia/bulimia; Confinement Anxiety Immunizations Pneumococcal Vaccine: [name], [name] ([id number]) [id number] Page 10 of 11 Received Pneumococcal Vaccination: Yes Received Pneumococcal Vaccination On or After 60th Birthday: Yes Implantable Devices None Hospitalization / Surgery History Type of Hospitalization/Surgery right hip nailing 10/17/2019 05/17/2018 nephrolithotomy left 01/29/2017 right lymph noes biopsy cystoscopy 2017, 2018 2017 gastric bypass surgery Family and Social History Cancer: Yes; Diabetes: Yes - Father; Heart Disease: No; Hereditary Spherocytosis: No; Hypertension: Yes - Father; Kidney Disease: No; Lung Disease: No; Seizures: No; Stroke: No; Thyroid Problems: No; Tuberculosis: No; Current every day smoker; Marital Status - Divorced; Alcohol Use: Never; Drug Use: Prior History; Caffeine Use: Rarely; Financial Concerns: No; Food, Clothing or Shelter Needs: No; Support System Lacking: No; Transportation Concerns: No Electronic Signature(s) Signed: 03/31/2022 11:22:41 AM  By: Fredirick Maudlin MD FACS Entered By: Fredirick Maudlin on 03/31/2022 08:04:28 -------------------------------------------------------------------------------- SuperBill Details Patient Name: Date of Service: [name] 03/31/2022 Medical Record Number: [id number] Patient Account Number: [id number] Date of Birth/Sex: Treating RN: 1960-12-14 (61 y.o. M) Primary Care Provider: Evonnie Dawes, Jenny Reichmann Other Clinician: Referring Provider: Treating Provider/Extender: Evette [name] in Treatment: 8 Diagnosis Coding ICD-10 Codes Code Description [phone] Chronic venous hypertension (idiopathic) with ulcer and inflammation of right lower extremity 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 Facility Procedures : CPT4 Code: [id number] Description: 16384 - DEBRIDE WOUND 1ST 20 SQ CM OR < ICD-10 Diagnosis Description L97.312 Non-pressure chronic ulcer of right ankle with fat layer exposed Modifier: Quantity: 1 Physician Procedures : CPT4 Code Description Modifier [id number] - WC PHYS LEVEL 4 - EST PT 25 ICD-10 Diagnosis Description L97.312 Non-pressure chronic ulcer of right ankle with fat layer exposed I87.331 Chronic venous hypertension (idiopathic) with ulcer and  inflammation of right lower extremity Quantity: 1 : [id number] - WC PHYS DEBR WO ANESTH 20 SQ CM ICD-10 Diagnosis Description Q91.694 Non-pressure chronic ulcer of right ankle with fat layer exposed Quantity: 1 Electronic Signature(s) Signed: 03/31/2022 8:06:09 AM By: Fredirick Maudlin MD FACS Marvin Penman [name] ([id number])0:34:91 AM By: Fredirick Maudlin MD FACS [phone].pdf Page 11 of 11 Signed: 03/31/2022 Entered By: Fredirick Maudlin on 03/31/2022 20:10:07

## 2022-03-31 NOTE — Progress Notes (Signed)
GAELEN, BRAGER ([id number]) [id number] Page 1 of 7 Visit Report for 03/31/2022 Arrival Information Details Patient Name: Date of Service: [name], Marvin [place] J. 03/31/2022 7:30 A M Medical Record Number: [id number] Patient Account Number: [id number] Date of Birth/Sex: Treating RN: 19-Mar-1961 (61 y.o. [name] Primary Care Jumaane Weatherford: Evonnie Dawes, Jenny Reichmann Other Clinician: Referring Khayree Delellis: Treating Shawnae Leiva/Extender: Evette Georges in Treatment: 8 Visit Information History Since Last Visit Added or deleted any medications: No Patient Arrived: [name] Any new allergies or adverse reactions: No Arrival Time: 07:41 Had a fall or experienced change in No Accompanied By: self activities of daily living that may affect Transfer Assistance: None risk of falls: Patient Identification Verified: Yes Signs or symptoms of abuse/neglect since last visito No Secondary Verification Process Completed: Yes Hospitalized since last visit: No Patient Requires Transmission-Based Precautions: No Implantable device outside of the clinic excluding No Patient Has Alerts: No cellular tissue based products placed in the center since last visit: Has Dressing in Place as Prescribed: Yes Has Compression in Place as Prescribed: Yes Pain Present Now: No Electronic Signature(s) Signed: 03/31/2022 3:57:12 PM By: Adline Peals Entered By: Adline Peals on 03/31/2022 07:43:01 -------------------------------------------------------------------------------- Compression Therapy Details Patient Name: Date of Service: Marvin Atlas, PA UL J. 03/31/2022 7:30 A M Medical Record Number: [id number] Patient Account Number: [id number] Date of Birth/Sex: Treating RN: 06-20-1960 (61 y.o. [name] Primary Care Jaeleen Inzunza: Evonnie Dawes, Jenny Reichmann Other Clinician: Referring Marlayna Bannister: Treating Caylee Vlachos/Extender: Evette Georges in Treatment:  8 Compression Therapy Performed for Wound Assessment: Wound #7 Right,Lateral Ankle Performed By: Clinician Adline Peals, RN Compression Type: Double Layer Post Procedure Diagnosis Same as Pre-procedure Electronic Signature(s) Signed: 03/31/2022 3:57:12 PM By: Adline Peals Entered By: Adline Peals on 03/31/2022 07:53:42 Adah Salvage ([id number]) [id number] Page 2 of 7 -------------------------------------------------------------------------------- Encounter Discharge Information Details Patient Name: Date of Service: [name], Marvin [place] J. 03/31/2022 7:30 A M Medical Record Number: [id number] Patient Account Number: [id number] Date of Birth/Sex: Treating RN: [birth year] (61 y.o. [name] Primary Care Anyelina Claycomb: Evonnie Dawes, Jenny Reichmann Other Clinician: Referring Hibba Schram: Treating Rainah Kirshner/Extender: Evette Georges in Treatment: 8 Encounter Discharge Information Items Post Procedure Vitals Discharge Condition: Stable Temperature (F): 97.7 Ambulatory Status: Cane Pulse (bpm): 61 Discharge Destination: Home Respiratory Rate (breaths/min): 18 Transportation: Private Auto Blood Pressure (mmHg): 110/73 Accompanied By: self Schedule Follow-up Appointment: Yes Clinical Summary of Care: Patient Declined Electronic Signature(s) Signed: 03/31/2022 3:57:12 PM By: Adline Peals Entered By: Adline Peals on 03/31/2022 08:15:59 -------------------------------------------------------------------------------- Lower Extremity Assessment Details Patient Name: Date of Service: Marvin Atlas, PA UL J. 03/31/2022 7:30 A M Medical Record Number: [id number] Patient Account Number: [id number] Date of Birth/Sex: Treating RN: 12-19-1960 (61 y.o. [name] Primary Care Annalaura Sauseda: Evonnie Dawes, Jenny Reichmann Other Clinician: Referring Vaidehi Braddy: Treating Felesha Moncrieffe/Extender: Evette Georges in Treatment: 8 Edema  Assessment Assessed: [Left: No] [Right: No] Edema: [Left: Ye] [Right: s] Calf Left: Right: Point of Measurement: 42.5 cm From Medial Instep 38.4 cm Ankle Left: Right: Point of Measurement: 11.5 cm From Medial Instep 24 cm Vascular Assessment Pulses: Dorsalis Pedis Palpable: [Right:Yes] Electronic Signature(s) Signed: 03/31/2022 3:57:12 PM By: Adline Peals Entered By: Adline Peals on 03/31/2022 07:46:47 Multi Wound Chart Details -------------------------------------------------------------------------------- Adah Salvage ([id number]) [id number] Page 3 of 7 Patient Name: Date of Service: [name], Marvin [place] J. 03/31/2022 7:30 A M Medical Record Number: [id number] Patient Account Number: [id number] Date of Birth/Sex: Treating RN: 23-Apr-1960 (61 y.o.  M) Primary Care Mecca Barga: Evonnie Dawes, Jenny Reichmann Other Clinician: Referring Masiah Lewing: Treating Gumecindo Hopkin/Extender: Evette Georges in Treatment: 8 Vital Signs Height(in): 71 Pulse(bpm): 61 Weight(lbs): 230 Blood Pressure(mmHg): 110/73 Body Mass Index(BMI): 32.1 Temperature(F): 97.7 Respiratory Rate(breaths/min): 18 Wound Assessments Wound Number: 7 N/A N/A Photos: No Photos N/A N/A Right, Lateral Ankle N/A N/A Wound Location: Gradually Appeared N/A N/A Wounding Event: Venous Leg Ulcer N/A N/A Primary Etiology: Anemia, Sleep Apnea, Congestive N/A N/A Comorbid History: Heart Failure, Coronary Artery Disease, Peripheral Venous Disease, Osteoarthritis 01/11/2022 N/A N/A Date Acquired: 8 N/A N/A Weeks of Treatment: Open N/A N/A Wound Status: No N/A N/A Wound Recurrence: 0.1x0.1x0.1 N/A N/A Measurements L x W x D (cm) 0.008 N/A N/A A (cm) : rea 0.001 N/A N/A Volume (cm) : 99.60% N/A N/A % Reduction in A rea: 99.80% N/A N/A % Reduction in Volume: Full Thickness With Exposed Support N/A N/A Classification: Structures None Present N/A N/A Exudate A  mount: Distinct, outline attached N/A N/A Wound Margin: None Present (0%) N/A N/A Granulation A mount: Large (67-100%) N/A N/A Necrotic A mount: Eschar N/A N/A Necrotic Tissue: Fascia: No N/A N/A Exposed Structures: Fat Layer (Subcutaneous Tissue): No Tendon: No Muscle: No Joint: No Bone: No Large (67-100%) N/A N/A Epithelialization: Debridement - Selective/Open Wound N/A N/A Debridement: Pre-procedure Verification/Time Out 07:52 N/A N/A Taken: Lidocaine 4% Topical Solution N/A N/A Pain Control: Necrotic/Eschar, Slough N/A N/A Tissue Debrided: Non-Viable Tissue N/A N/A Level: 0.09 N/A N/A Debridement A (sq cm): rea Curette N/A N/A Instrument: Minimum N/A N/A Bleeding: Pressure N/A N/A Hemostasis A chieved: Procedure was tolerated well N/A N/A Debridement Treatment Response: 0.1x0.1x0.1 N/A N/A Post Debridement Measurements L x W x D (cm) 0.001 N/A N/A Post Debridement Volume: (cm) Excoriation: No N/A N/A Periwound Skin Texture: Induration: No Callus: No Crepitus: No Rash: No Scarring: No Maceration: No N/A N/A Periwound Skin Moisture: Dry/Scaly: No Atrophie Blanche: No N/A N/A Periwound Skin Color: Cyanosis: No Ecchymosis: No Erythema: No Hemosiderin Staining: No Mottled: No Pallor: No Rubor: No No Abnormality N/A N/A Temperature: Yes N/A N/A Tenderness on Palpation: Compression Therapy N/A N/A Procedures Performed: Debridement SID, GREENER ([id number]) [id number] Page 4 of 7 Treatment Notes Electronic Signature(s) Signed: 03/31/2022 8:02:34 AM By: Fredirick Maudlin MD FACS Entered By: Fredirick Maudlin on 03/31/2022 08:02:33 -------------------------------------------------------------------------------- Multi-Disciplinary Care Plan Details Patient Name: Date of Service: Marvin Atlas, PA UL J. 03/31/2022 7:30 A M Medical Record Number: [id number] Patient Account Number: [id number] Date of Birth/Sex: Treating  RN: 1960/12/19 (61 y.o. [name] Primary Care Rosa Gambale: Evonnie Dawes, Jenny Reichmann Other Clinician: Referring Haedyn Ancrum: Treating Nesta Kimple/Extender: Evette Georges in Treatment: 8 Multidisciplinary Care Plan reviewed with physician Active Inactive Venous Leg Ulcer Nursing Diagnoses: Actual venous Insuffiency (use after diagnosis is confirmed) Knowledge deficit related to disease process and management Potential for venous Insuffiency (use before diagnosis confirmed) Goals: Non-invasive venous studies are completed as ordered Date Initiated: 02/03/2022 Target Resolution Date: 05/15/2022 Goal Status: Active Patient will maintain optimal edema control Date Initiated: 02/03/2022 Target Resolution Date: 05/15/2022 Goal Status: Active Patient/caregiver will verbalize understanding of disease process and disease management Date Initiated: 02/03/2022 Date Inactivated: 02/17/2022 Target Resolution Date: 02/17/2022 Goal Status: Met Interventions: Assess peripheral edema status every visit. Compression as ordered Provide education on venous insufficiency Notes: Wound/Skin Impairment Nursing Diagnoses: Impaired tissue integrity Knowledge deficit related to ulceration/compromised skin integrity Goals: Patient will have a decrease in wound volume by X% from date: (specify in notes) Date Initiated: 02/03/2022  Target Resolution Date: 05/15/2022 Goal Status: Active Patient/caregiver will verbalize understanding of skin care regimen Date Initiated: 02/03/2022 Date Inactivated: 03/18/2022 Target Resolution Date: 03/12/2022 Goal Status: Met Ulcer/skin breakdown will have a volume reduction of 30% by week 4 Date Initiated: 02/03/2022 Date Inactivated: 03/11/2022 Target Resolution Date: 02/26/2022 Goal Status: Met Ulcer/skin breakdown will have a volume reduction of 50% by week 8 Date Initiated: 03/11/2022 Target Resolution Date: 05/15/2022 Goal Status:  Active Interventions: Assess patient/caregiver ability to obtain necessary supplies GENERAL, WEARING ([id number]) [id number] Page 5 of 7 Assess patient/caregiver ability to perform ulcer/skin care regimen upon admission and as needed Assess ulceration(s) every visit Notes: Electronic Signature(s) Signed: 03/31/2022 3:57:12 PM By: Adline Peals Entered By: Adline Peals on 03/31/2022 07:51:49 -------------------------------------------------------------------------------- Pain Assessment Details Patient Name: Date of Service: Marvin Atlas, PA UL J. 03/31/2022 7:30 A M Medical Record Number: [id number] Patient Account Number: [id number] Date of Birth/Sex: Treating RN: Jan 28, 1961 (61 y.o. [name] Primary Care Deovion Batrez: Evonnie Dawes, Jenny Reichmann Other Clinician: Referring Kristal Perl: Treating Gil Ingwersen/Extender: Evette Georges in Treatment: 8 Active Problems Location of Pain Severity and Description of Pain Patient Has Paino No Site Locations Rate the pain. Current Pain Level: 0 Pain Management and Medication Current Pain Management: Electronic Signature(s) Signed: 03/31/2022 3:57:12 PM By: Adline Peals Entered By: Adline Peals on 03/31/2022 07:43:33 -------------------------------------------------------------------------------- Patient/Caregiver Education Details Patient Name: Date of Service: Marvin Atlas, PA UL J. 12/19/2023andnbsp7:30 A M Medical Record Number: [id number] Patient Account Number: [id number] Date of Birth/Gender: Treating RN: [birth year] (61 y.o. [name] Primary Care Physician: Evonnie Dawes, Jenny Reichmann Other Clinician: Referring Physician: Treating Physician/Extender: Evette Georges in Treatment: [address] NECHEMIA, CHIAPPETTA ([id number]) [id number] Page 6 of 7 Education Assessment Education Provided To: Patient Education Topics Provided Wound/Skin  Impairment: Methods: Explain/Verbal Responses: Reinforcements needed, State content correctly Electronic Signature(s) Signed: 03/31/2022 3:57:12 PM By: Adline Peals Entered By: Adline Peals on 03/31/2022 07:52:04 -------------------------------------------------------------------------------- Wound Assessment Details Patient Name: Date of Service: Marvin Atlas, PA UL J. 03/31/2022 7:30 A M Medical Record Number: [id number] Patient Account Number: [id number] Date of Birth/Sex: Treating RN: November 25, 1960 (60 y.o. [name] Primary Care Yamaris Cummings: Evonnie Dawes, Jenny Reichmann Other Clinician: Referring Anesha Hackert: Treating Toshiye Kever/Extender: Evette Georges in Treatment: 8 Wound Status Wound Number: 7 Primary Venous Leg Ulcer Etiology: Wound Location: Right, Lateral Ankle Wound Open Wounding Event: Gradually Appeared Status: Date Acquired: 01/11/2022 Comorbid Anemia, Sleep Apnea, Congestive Heart Failure, Coronary Artery Weeks Of Treatment: 8 History: Disease, Peripheral Venous Disease, Osteoarthritis Clustered Wound: No Wound Measurements Length: (cm) 0.1 Width: (cm) 0.1 Depth: (cm) 0.1 Area: (cm) 0.008 Volume: (cm) 0.001 % Reduction in Area: 99.6% % Reduction in Volume: 99.8% Epithelialization: Large (67-100%) Tunneling: No Undermining: No Wound Description Classification: Full Thickness With Exposed Support Structures Wound Margin: Distinct, outline attached Exudate Amount: None Present Foul Odor After Cleansing: No Slough/Fibrino No Wound Bed Granulation Amount: None Present (0%) Exposed Structure Necrotic Amount: Large (67-100%) Fascia Exposed: No Necrotic Quality: Eschar Fat Layer (Subcutaneous Tissue) Exposed: No Tendon Exposed: No Muscle Exposed: No Joint Exposed: No Bone Exposed: No Periwound Skin Texture Texture Color No Abnormalities Noted: Yes No Abnormalities Noted: Yes Moisture Temperature / Pain No Abnormalities Noted:  Yes Temperature: No Abnormality Tenderness on Palpation: Yes Treatment Notes Wound #7 (Ankle) Wound Laterality: Right, Lateral MALEK, SKOG ([id number]) [id number] Page 7 of 7 Cleanser Soap and Water Discharge Instruction: May shower and wash wound with dial antibacterial soap and water prior to  dressing change. Wound Cleanser Discharge Instruction: Cleanse the wound with wound cleanser prior to applying a clean dressing using gauze sponges, not tissue or cotton balls. Peri-Wound Care Sween Lotion (Moisturizing lotion) Discharge Instruction: Apply moisturizing lotion as directed Topical Mupirocin Ointment Discharge Instruction: Apply Mupirocin (Bactroban) as instructed Primary Dressing Promogran Prisma Matrix, 4.34 (sq in) (silver collagen) Discharge Instruction: Moisten collagen with saline or hydrogel Secondary Dressing Woven Gauze Sponge, Non-Sterile 4x4 in Discharge Instruction: Apply over primary dressing as directed. Secured With Compression Wrap CoFlex TLC XL 2-layer Compression System 4x7 (in/yd) Discharge Instruction: Apply CoFlex 2-layer with ZINC compression as directed. (alt for 4 layer) Compression Stockings Add-Ons Electronic Signature(s) Signed: 03/31/2022 3:57:12 PM By: Adline Peals Entered By: Adline Peals on 03/31/2022 07:47:14 -------------------------------------------------------------------------------- Vitals Details Patient Name: Date of Service: Marvin Atlas, PA UL J. 03/31/2022 7:30 A M Medical Record Number: [id number] Patient Account Number: [id number] Date of Birth/Sex: Treating RN: 14-Sep-1960 (61 y.o. [name] Primary Care Conroy Goracke: Evonnie Dawes, Jenny Reichmann Other Clinician: Referring Edel Rivero: Treating Destine Ambroise/Extender: Evette Georges in Treatment: 8 Vital Signs Time Taken: 07:43 Temperature (F): 97.7 Height (in): 71 Pulse (bpm): 61 Weight (lbs): 230 Respiratory Rate  (breaths/min): 18 Body Mass Index (BMI): 32.1 Blood Pressure (mmHg): 110/73 Reference Range: 80 - 120 mg / dl Electronic Signature(s) Signed: 03/31/2022 3:57:12 PM By: Adline Peals Entered By: Adline Peals on 03/31/2022 07:43:28

## 2022-04-01 ENCOUNTER — Encounter (HOSPITAL_BASED_OUTPATIENT_CLINIC_OR_DEPARTMENT_OTHER): Payer: PPO | Admitting: General Surgery

## 2022-04-07 ENCOUNTER — Encounter (HOSPITAL_BASED_OUTPATIENT_CLINIC_OR_DEPARTMENT_OTHER): Payer: PPO | Admitting: General Surgery

## 2022-04-07 DIAGNOSIS — I87331 Chronic venous hypertension (idiopathic) with ulcer and inflammation of right lower extremity: Secondary | ICD-10-CM | POA: Diagnosis not present

## 2022-04-07 DIAGNOSIS — L97312 Non-pressure chronic ulcer of right ankle with fat layer exposed: Secondary | ICD-10-CM | POA: Diagnosis not present

## 2022-04-07 DIAGNOSIS — I872 Venous insufficiency (chronic) (peripheral): Secondary | ICD-10-CM | POA: Diagnosis not present

## 2022-04-07 DIAGNOSIS — F1111 Opioid abuse, in remission: Secondary | ICD-10-CM | POA: Diagnosis not present

## 2022-04-07 NOTE — Progress Notes (Signed)
KELSEN, CELONA ([id number]) [id number] Page 1 of 11 Visit Report for 04/07/2022 Chief Complaint Document Details Patient Name: Date of Service: [name], [place] [name] 04/07/2022 9:15 A M Medical Record Number: [id number] Patient Account Number: [id number] Date of Birth/Sex: Treating RN: 01-30-1961 (61 y.o. M) Primary Care Provider: Evonnie Dawes, Jenny Reichmann Other Clinician: Referring Provider: Treating Provider/Extender: Evette Georges in Treatment: 9 Information Obtained from: Patient Chief Complaint 05/20/2020; patient is here for review of wounds on his bilateral lower legs in the setting of severe chronic venous insufficiency with lymphedema 02/03/2022: returns to clinic today with new wounds on RLE Electronic Signature(s) Signed: 04/07/2022 9:47:16 AM By: Fredirick Maudlin MD FACS Entered By: Fredirick Maudlin on 04/07/2022 09:47:16 -------------------------------------------------------------------------------- Debridement Details Patient Name: Date of Service: Marvin Atlas, PA [place] [name] 04/07/2022 9:15 A M Medical Record Number: [id number] Patient Account Number: [id number] Date of Birth/Sex: Treating RN: [birth year] (61 y.o. [name] Primary Care Provider: Evonnie Dawes, Jenny Reichmann Other Clinician: Referring Provider: Treating Provider/Extender: Evette Georges in Treatment: 9 Debridement Performed for Assessment: Wound #7 Right,Lateral Ankle Performed By: Physician Fredirick Maudlin, MD Debridement Type: Debridement Severity of Tissue Pre Debridement: Limited to breakdown of skin Level of Consciousness (Pre-procedure): Awake and Alert Pre-procedure Verification/Time Out Yes - 09:43 Taken: Start Time: 09:43 Pain Control: Lidocaine 4% T opical Solution T Area Debrided (L x W): otal 0.2 (cm) x 0.2 (cm) = 0.04 (cm) Tissue and other material debrided: Non-Viable, Eschar Level: Non-Viable Tissue Debridement Description:  Selective/Open Wound Instrument: Curette Bleeding: Minimum Hemostasis Achieved: Pressure Response to Treatment: Procedure was tolerated well Level of Consciousness (Post- Awake and Alert procedure): Post Debridement Measurements of Total Wound Length: (cm) 0.1 Width: (cm) 0.1 Depth: (cm) 0.1 Volume: (cm) 0.001 Character of Wound/Ulcer Post Debridement: Improved Severity of Tissue Post Debridement: Fat layer exposed [name], [name] ([id number]) [id number] Page 2 of 11 Post Procedure Diagnosis Same as Pre-procedure Notes scribed for Dr. Celine Ahr by Adline Peals, RN Electronic Signature(s) Signed: 04/07/2022 10:10:54 AM By: Fredirick Maudlin MD FACS Signed: 04/07/2022 4:17:15 PM By: Sabas Sous By: Adline Peals on 04/07/2022 09:44:32 -------------------------------------------------------------------------------- HPI Details Patient Name: Date of Service: Marvin Atlas, PA [facility] [name] 04/07/2022 9:15 A M Medical Record Number: [id number] Patient Account Number: [id number] Date of Birth/Sex: Treating RN: May 14, 1960 (61 y.o. M) Primary Care Provider: Evonnie Dawes, Jenny Reichmann Other Clinician: Referring Provider: Treating Provider/Extender: Evette Georges in Treatment: 9 History of Present Illness HPI Description: ADMISSION 05/20/2020 This is a 61 year old man who is not a diabetic however he has a long history of chronic venous insufficiency with wounds on his lower extremities. He went to the wound care center in [place] for a long period of time with recurrent wounds in these areas treated with compression wraps. He comes in today with wounds on his bilateral lower legs that he says have been there about a year on the right and about 6 months on the left. He went to see Dr. Oneida Alar on 01/17/2020 who noted that he had no history of DVT but does have a history of venous insufficiency wounds. They noted that he was considering laser  ablation in October 2020 but he canceled the procedure. He had an episode of bleeding from varicose veins in 2019. He had repeated venous reflux study on 10/6 which showed no evidence of a DVT or SVT Noted that he had venous reflux in the right greater saphenous vein in the thigh and the calf  on the right. Also . noted to have venous reflux in the right femoral vein right popliteal vein and right small saphenous vein. At the time in October they wanted the wounds to heal a bit he also also had a right groin wound which is apparently closed. Not sure about the follow-up with vein and vascular. He comes in today with some light compression wraps and collagen on the wound placed by Dr. Lilia Pro who is a Education officer, environmental in [place]. He has 2 areas on the left leg 1 anteriorly and a large area posteriorly and a smaller area on the right anterior lower leg. He has marked lower extremity edema which is nonpitting. Hemosiderin deposition on both lower legs extensively. There is dilated venules in his feet. Past medical history includes history of opioid abuse in remission, left nephrolithiasis, history of an ablation although I did not see this in Dr. Oneida Alar notes, congestive heart failure and anemia ABIs in our clinic were noncompressible bilaterally 05/27/2020; we admitted this patient to clinic last week. He has bilateral superficial venous insufficiency with bilateral wounds left greater than right. We put him in 4 layer compression with silver alginate. The he was previously cared for in the [place] wound care clinic when it was open. His wounds are better with compression this week and there is certainly less swelling. 2/21; bilateral superficial venous insufficiency wounds. A cluster on the left lateral. He has very well-defined areas on the right anterior right and right lateral. We are using silver alginate and 4-layer compression 2/28; bilateral superficial venous insufficiency wounds in the setting  of significant stage III lymphedema. He has a cluster on the left which is smaller. His area on the right anterior and right lateral also look better. 3/14 superficial bilateral venous insufficiency wounds. Much better on both sides in fact the area on the right posterior is healed. He still has 2 small open areas on the right anterior and the left lateral 3/21; superficial bilateral venous insufficiency wounds. Everything is closed on the right side. Small area still remains on the left. We will transition him today to his right extremiteaze stocking. Still dressing the remaining wound on the left with compression 3/28; the patient's right leg remains closed in his wraparound stocking. Disappointingly today the area on the left lateral leg looks about the same. I thought this might be closed with been using silver alginate 4/4; right leg remains in his wraparound stocking.. Left lateral leg measures smaller even under illumination the wound looks good. Changed him to [facility] with additional ABD compression last week 4/11; left lateral posterior very tiny superficial wound. This should be closed by next week. Using Hydrofera Blue. 4/18; left lateral wound is totally epithelialized. He has his own external compression stocking and compression pumps. READMISSION 02/03/2022 This is a 61 year old man well-known to the wound care center. He has chronic venous insufficiency and lymphedema. He has been evaluated in the past and has documented venous reflux, but thus far, has not undergone ablation. He returns to clinic today with multiple wounds on his right lower extremity. He reports that the medial lower leg wound started when something sharp stuck into his leg about a year ago. He says he was trying to treat this at home and took a course of cephalexin that he had leftover from a previous event. It did not improve and in the past 3 to 4 weeks, it has actually gotten worse, to the point that he  is unable to use  his lymphedema pumps secondary to pain. He has another ulcer on his left lateral ankle and one between his first and second toes, both present for about 3 to 4 weeks, per his report. On his right medial lower leg, there are 2 ulcers with slough accumulated. The surface underneath the slough is quite fibrous, consistent with his history of multiple lower leg ulcerations. He has a triangular wound on his right lateral ankle, similar in appearance to the others. Between his first and second toes, he has what appears to be athlete's foot, with a fungal odor and skin breakdown with eschar and slough present. 02/09/2022: On the right medial lower leg wound, there is significant slough accumulation. The right lateral ankle wound is gray and the patient reports an increase in pain in this site. The area between his first and second toes has dried out considerably. The more proximal ulcer on his right posterior leg has some [name], [name] ([id number]) [id number] Page 3 of 11 slough buildup over a fibrotic surface. Edema control is markedly improved. 02/17/2022: The right medial lower leg and right lateral ankle wound still have a lot of slough accumulation, but they are less painful. The culture that I took last week grew out multiple gram-positive species. The area between his first and second toes still has a consistent appearance with athlete's foot. The more proximal lateral lower leg wound has healed. He still has not been scheduled for his repeat venous reflux studies. 02/25/2022: He had his repeat reflux studies done but for some reason they only did the right leg. He does have evidence of reflux. In the past, bilateral studies were performed and he has significant venous reflux in both legs. His edema control is excellent this week. The areas between his toes are drying up. The right medial lower leg wound is substantially smaller with just a little bit of  eschar overlying the open area. The lateral ankle wound still has some slough and eschar accumulation and remains fairly tender. 03/11/2022: The right medial leg wound has closed. The lateral ankle wound is substantially smaller with just some slough and eschar present. It is tender, but less so than on previous visits. He is still having some issues with moisture between his toes and athlete's foot. His wrap slipped and the area above the wrap is markedly swollen, well the areas that was still contained in the wrap has excellent edema control. 03/18/2022: The lateral ankle wound has contracted further with just slough and eschar present. He is not experiencing any pain in the location. His interdigital tinea pedis has responded well to over-the-counter antifungal spray powder. We had him in a zinc-based Coflex wrap and this stayed up well; edema control is much better this week. 12/12; only a small open wound remains on the right lateral ankle. The medial ankle is closed. The interdigital is a a result of tinea and that is closed as well. Primary dressing is silver alginate under compression. The patient is not using his compression pumps that this area. He has some form of compression stocking on the left leg that he was also wearing on the right at the time this wound formed although I am not sure of the compression. He brought these on [facility] 03/31/2022: The lateral ankle wound remains open under a layer of eschar. It is small. Edema control is excellent. The interdigital areas have closed and remain dry with his use of the over-the-counter spray powder. 04/07/2022: The lateral ankle wound is down to  just a couple of millimeters. Edema control is excellent. Electronic Signature(s) Signed: 04/07/2022 9:47:52 AM By: Fredirick Maudlin MD FACS Entered By: Fredirick Maudlin on 04/07/2022 09:47:51 -------------------------------------------------------------------------------- Physical Exam  Details Patient Name: Date of Service: Marvin Atlas, PA [place] [name] 04/07/2022 9:15 A M Medical Record Number: [id number] Patient Account Number: [id number] Date of Birth/Sex: Treating RN: 09/27/1960 (61 y.o. M) Primary Care Provider: Evonnie Dawes, Jenny Reichmann Other Clinician: Referring Provider: Treating Provider/Extender: Evette Georges in Treatment: 9 Constitutional Slightly hypertensive. . . . No acute distress. Respiratory Normal work of breathing on room air. Notes 04/07/2022: The lateral ankle wound is down to just a couple of millimeters. Edema control is excellent. Electronic Signature(s) Signed: 04/07/2022 9:48:23 AM By: Fredirick Maudlin MD FACS Entered By: Fredirick Maudlin on 04/07/2022 09:48:23 -------------------------------------------------------------------------------- Physician Orders Details Patient Name: Date of Service: Marvin Atlas, PA [place] [name] 04/07/2022 9:15 A M Medical Record Number: [id number] Patient Account Number: [id number] Date of Birth/Sex: Treating RN: 1960-05-06 (61 y.o. [name] Primary Care Provider: Evonnie Dawes, Jenny Reichmann Other Clinician: Referring Provider: Treating Provider/Extender: Evette Georges in Treatment: [address] ([id number]) [id number] Page 4 of 11 Verbal / Phone Orders: No Diagnosis Coding ICD-10 Coding Code Description I87.331 Chronic venous hypertension (idiopathic) with ulcer and inflammation of right lower extremity 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 Follow-up Appointments ppointment in 1 week. - Dr. Celine Ahr rm 2 Return A Other: - Vein and Vascular Speak with Damita Dunnings about venous studies phone [phone]. Anesthetic (In clinic) Topical Lidocaine 4% applied to wound bed Bathing/ Shower/ Hygiene May shower with protection but do not get  wound dressing(s) wet. Protect dressing(s) with water repellant cover (for example, large plastic bag) or a cast cover and may then take shower. Edema Control - Lymphedema / SCD / Other Lymphedema Pumps. Use Lymphedema pumps on leg(s) 2-3 times a day for 45-60 minutes. If wearing any wraps or hose, do not remove them. Continue exercising as instructed. Avoid standing for long periods of time. Exercise regularly Compression stocking or Garment 20-30 mm/Hg pressure to: - toleftleg daily Wound Treatment Wound #7 - Ankle Wound Laterality: Right, Lateral Cleanser: Soap and Water 1 x Per Week/7 Days Discharge Instructions: May shower and wash wound with dial antibacterial soap and water prior to dressing change. Cleanser: Wound Cleanser 1 x Per Week/7 Days Discharge Instructions: Cleanse the wound with wound cleanser prior to applying a clean dressing using gauze sponges, not tissue or cotton balls. Peri-Wound Care: Sween Lotion (Moisturizing lotion) 1 x Per Week/7 Days Discharge Instructions: Apply moisturizing lotion as directed Topical: Mupirocin Ointment 1 x Per Week/7 Days Discharge Instructions: Apply Mupirocin (Bactroban) as instructed Prim Dressing: Promogran Prisma Matrix, 4.34 (sq in) (silver collagen) 1 x Per Week/7 Days ary Discharge Instructions: Moisten collagen with saline or hydrogel Secondary Dressing: Woven Gauze Sponge, Non-Sterile 4x4 in 1 x Per Week/7 Days Discharge Instructions: Apply over primary dressing as directed. Compression Wrap: CoFlex TLC XL 2-layer Compression System 4x7 (in/yd) 1 x Per Week/7 Days Discharge Instructions: Apply CoFlex 2-layer with ZINC compression as directed. (alt for 4 layer) Patient Medications llergies: No Known Allergies A Notifications Medication Indication Start End 04/07/2022 lidocaine DOSE topical 4 % cream - cream topical Electronic Signature(s) Signed: 04/07/2022 10:10:54 AM By: Fredirick Maudlin MD FACS Entered By: Fredirick Maudlin on 04/07/2022 09:48:35 Problem List Details -------------------------------------------------------------------------------- Marvin Penman  [name] ([id number]) [id number] Page 5 of 11 Patient Name: Date of Service: [name], [place] [name] 04/07/2022 9:15 A M Medical Record Number: [id number] Patient Account Number: [id number] Date of Birth/Sex: Treating RN: [birth year] (61 y.o. M) Primary Care Provider: Evonnie Dawes, Jenny Reichmann Other Clinician: Referring Provider: Treating Provider/Extender: Evette Georges in Treatment: 9 Active Problems ICD-10 Encounter Code Description Active Date MDM Diagnosis I87.331 Chronic venous hypertension (idiopathic) with ulcer and inflammation of right 02/03/2022 No Yes lower extremity L97.312 Non-pressure chronic ulcer of right ankle with fat layer exposed 02/03/2022 No Yes L97.512 Non-pressure chronic ulcer of other part of right foot with fat layer exposed 02/03/2022 No Yes Inactive Problems Resolved Problems ICD-10 Code Description Active Date Resolved Date L97.812 Non-pressure chronic ulcer of other part of right lower leg with fat layer exposed 02/03/2022 02/03/2022 Electronic Signature(s) Signed: 04/07/2022 9:47:03 AM By: Fredirick Maudlin MD FACS Entered By: Fredirick Maudlin on 04/07/2022 09:47:03 -------------------------------------------------------------------------------- Progress Note Details Patient Name: Date of Service: Marvin Atlas, [place] [name] 04/07/2022 9:15 A M Medical Record Number: [id number] Patient Account Number: [id number] Date of Birth/Sex: Treating RN: 1960/09/19 (61 y.o. M) Primary Care Provider: Evonnie Dawes, Jenny Reichmann Other Clinician: Referring Provider: Treating Provider/Extender: Evette Georges in Treatment: 9 Subjective Chief Complaint Information obtained from Patient 05/20/2020; patient is here for review of wounds on his bilateral lower legs in the setting of severe  chronic venous insufficiency with lymphedema 02/03/2022: returns to clinic today with new wounds on RLE History of Present Illness (HPI) ADMISSION 05/20/2020 This is a 61 year old man who is not a diabetic however he has a long history of chronic venous insufficiency with wounds on his lower extremities. He went to the wound care center in [place] for a long period of time with recurrent wounds in these areas treated with compression wraps. He comes in today with wounds on his bilateral lower legs that he says have been there about a year on the right and about 6 months on the left. He went to see Dr. Oneida Alar on 01/17/2020 who noted that he had no history of DVT but does have a history of venous insufficiency wounds. They noted that he was considering laser ablation in October 2020 but he canceled the procedure. He had an episode of bleeding from varicose veins in 2019. He had repeated venous reflux study on 10/6 which showed no evidence of a DVT or SVT Noted that he had venous reflux in the right greater saphenous vein in the thigh and the calf on the right. Also . noted to have venous reflux in the right femoral vein right popliteal vein and right small saphenous vein. At the time in October they wanted the wounds to heal [name], [name] ([id number]) [id number] Page 6 of 11 a bit he also also had a right groin wound which is apparently closed. Not sure about the follow-up with vein and vascular. He comes in today with some light compression wraps and collagen on the wound placed by Dr. Lilia Pro who is a Education officer, environmental in [place]. He has 2 areas on the left leg 1 anteriorly and a large area posteriorly and a smaller area on the right anterior lower leg. He has marked lower extremity edema which is nonpitting. Hemosiderin deposition on both lower legs extensively. There is dilated venules in his feet. Past medical history includes history of opioid abuse in remission, left  nephrolithiasis, history of an ablation although I did not see this in Dr. Oneida Alar  notes, congestive heart failure and anemia ABIs in our clinic were noncompressible bilaterally 05/27/2020; we admitted this patient to clinic last week. He has bilateral superficial venous insufficiency with bilateral wounds left greater than right. We put him in 4 layer compression with silver alginate. The he was previously cared for in the [place] wound care clinic when it was open. His wounds are better with compression this week and there is certainly less swelling. 2/21; bilateral superficial venous insufficiency wounds. A cluster on the left lateral. He has very well-defined areas on the right anterior right and right lateral. We are using silver alginate and 4-layer compression 2/28; bilateral superficial venous insufficiency wounds in the setting of significant stage III lymphedema. He has a cluster on the left which is smaller. His area on the right anterior and right lateral also look better. 3/14 superficial bilateral venous insufficiency wounds. Much better on both sides in fact the area on the right posterior is healed. He still has 2 small open areas on the right anterior and the left lateral 3/21; superficial bilateral venous insufficiency wounds. Everything is closed on the right side. Small area still remains on the left. We will transition him today to his right extremiteaze stocking. Still dressing the remaining wound on the left with compression 3/28; the patient's right leg remains closed in his wraparound stocking. Disappointingly today the area on the left lateral leg looks about the same. I thought this might be closed with been using silver alginate 4/4; right leg remains in his wraparound stocking.. Left lateral leg measures smaller even under illumination the wound looks good. Changed him to [facility] with additional ABD compression last week 4/11; left lateral posterior very tiny  superficial wound. This should be closed by next week. Using Hydrofera Blue. 4/18; left lateral wound is totally epithelialized. He has his own external compression stocking and compression pumps. READMISSION 02/03/2022 This is a 61 year old man well-known to the wound care center. He has chronic venous insufficiency and lymphedema. He has been evaluated in the past and has documented venous reflux, but thus far, has not undergone ablation. He returns to clinic today with multiple wounds on his right lower extremity. He reports that the medial lower leg wound started when something sharp stuck into his leg about a year ago. He says he was trying to treat this at home and took a course of cephalexin that he had leftover from a previous event. It did not improve and in the past 3 to 4 weeks, it has actually gotten worse, to the point that he is unable to use his lymphedema pumps secondary to pain. He has another ulcer on his left lateral ankle and one between his first and second toes, both present for about 3 to 4 weeks, per his report. On his right medial lower leg, there are 2 ulcers with slough accumulated. The surface underneath the slough is quite fibrous, consistent with his history of multiple lower leg ulcerations. He has a triangular wound on his right lateral ankle, similar in appearance to the others. Between his first and second toes, he has what appears to be athlete's foot, with a fungal odor and skin breakdown with eschar and slough present. 02/09/2022: On the right medial lower leg wound, there is significant slough accumulation. The right lateral ankle wound is gray and the patient reports an increase in pain in this site. The area between his first and second toes has dried out considerably. The more proximal ulcer on his right  posterior leg has some slough buildup over a fibrotic surface. Edema control is markedly improved. 02/17/2022: The right medial lower leg and right lateral  ankle wound still have a lot of slough accumulation, but they are less painful. The culture that I took last week grew out multiple gram-positive species. The area between his first and second toes still has a consistent appearance with athlete's foot. The more proximal lateral lower leg wound has healed. He still has not been scheduled for his repeat venous reflux studies. 02/25/2022: He had his repeat reflux studies done but for some reason they only did the right leg. He does have evidence of reflux. In the past, bilateral studies were performed and he has significant venous reflux in both legs. His edema control is excellent this week. The areas between his toes are drying up. The right medial lower leg wound is substantially smaller with just a little bit of eschar overlying the open area. The lateral ankle wound still has some slough and eschar accumulation and remains fairly tender. 03/11/2022: The right medial leg wound has closed. The lateral ankle wound is substantially smaller with just some slough and eschar present. It is tender, but less so than on previous visits. He is still having some issues with moisture between his toes and athlete's foot. His wrap slipped and the area above the wrap is markedly swollen, well the areas that was still contained in the wrap has excellent edema control. 03/18/2022: The lateral ankle wound has contracted further with just slough and eschar present. He is not experiencing any pain in the location. His interdigital tinea pedis has responded well to over-the-counter antifungal spray powder. We had him in a zinc-based Coflex wrap and this stayed up well; edema control is much better this week. 12/12; only a small open wound remains on the right lateral ankle. The medial ankle is closed. The interdigital is a a result of tinea and that is closed as well. Primary dressing is silver alginate under compression. The patient is not using his compression pumps that  this area. He has some form of compression stocking on the left leg that he was also wearing on the right at the time this wound formed although I am not sure of the compression. He brought these on [facility] 03/31/2022: The lateral ankle wound remains open under a layer of eschar. It is small. Edema control is excellent. The interdigital areas have closed and remain dry with his use of the over-the-counter spray powder. 04/07/2022: The lateral ankle wound is down to just a couple of millimeters. Edema control is excellent. Patient History Information obtained from Patient. Family History Cancer, Diabetes - Father, Hypertension - Father, No family history of Heart Disease, Hereditary Spherocytosis, Kidney Disease, Lung Disease, Seizures, Stroke, Thyroid Problems, Tuberculosis. Social History Current every day smoker, Marital Status - Divorced, Alcohol Use - Never, Drug Use - Prior History, Caffeine Use - Rarely. Medical History Eyes Denies history of Cataracts, Glaucoma, Optic Neuritis Ear/Nose/Mouth/Throat Denies history of Chronic sinus problems/congestion, Middle ear problems Hematologic/Lymphatic Patient has history of Anemia Denies history of Hemophilia, Human Immunodeficiency Virus, Lymphedema, Sickle Cell Disease Respiratory Patient has history of Sleep Apnea [name], [name] ([id number]) [id number] Page 7 of 11 Denies history of Aspiration, Asthma, Chronic Obstructive Pulmonary Disease (COPD), Pneumothorax, Tuberculosis Cardiovascular Patient has history of Congestive Heart Failure, Coronary Artery Disease, Peripheral Venous Disease Denies history of Angina, Arrhythmia, Deep Vein Thrombosis, Hypertension, Hypotension, Myocardial Infarction, Peripheral Arterial Disease, Phlebitis, Vasculitis Gastrointestinal Denies history of  Cirrhosis , Colitis, Crohnoos, Hepatitis A, Hepatitis B, Hepatitis C Endocrine Denies history of Type I Diabetes, Type II  Diabetes Genitourinary Denies history of End Stage Renal Disease Immunological Denies history of Lupus Erythematosus, Raynaudoos, Scleroderma Integumentary (Skin) Denies history of History of Burn Musculoskeletal Patient has history of Osteoarthritis Denies history of Gout, Rheumatoid Arthritis, Osteomyelitis Neurologic Denies history of Dementia, Neuropathy, Quadriplegia, Paraplegia, Seizure Disorder Oncologic Denies history of Received Chemotherapy, Received Radiation Psychiatric Denies history of Anorexia/bulimia, Confinement Anxiety Hospitalization/Surgery History - right hip nailing 10/17/2019. - 05/17/2018 nephrolithotomy left. - 01/29/2017 right lymph noes biopsy. - cystoscopy 2017, 2018. - 2017 gastric bypass surgery. Medical A Surgical History Notes nd Gastrointestinal gastric bypass Objective Constitutional Slightly hypertensive. No acute distress. Vitals Time Taken: 9:34 AM, Height: 71 in, Weight: 230 lbs, BMI: 32.1, Temperature: 98.2 F, Pulse: 73 bpm, Respiratory Rate: 18 breaths/min, Blood Pressure: 150/73 mmHg. Respiratory Normal work of breathing on room air. General Notes: 04/07/2022: The lateral ankle wound is down to just a couple of millimeters. Edema control is excellent. Integumentary (Hair, Skin) Wound #7 status is Open. Original cause of wound was Gradually Appeared. The date acquired was: 01/11/2022. The wound has been in treatment 9 weeks. The wound is located on the Right,Lateral Ankle. The wound measures 0.1cm length x 0.1cm width x 0.1cm depth; 0.008cm^2 area and 0.001cm^3 volume. The wound is limited to skin breakdown. There is no tunneling or undermining noted. There is a none present amount of drainage noted. The wound margin is distinct with the outline attached to the wound base. There is no granulation within the wound bed. There is a large (67-100%) amount of necrotic tissue within the wound bed including Eschar. The periwound skin appearance had no  abnormalities noted for texture. The periwound skin appearance had no abnormalities noted for moisture. The periwound skin appearance had no abnormalities noted for color. Periwound temperature was noted as No Abnormality. The periwound has tenderness on palpation. 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 There was a Selective/Open Wound Non-Viable Tissue Debridement with a total area of 0.04 sq cm performed by Fredirick Maudlin, MD. With the following instrument(s): Curette to remove Non-Viable tissue/material. Material removed includes Eschar after achieving pain control using Lidocaine 4% Topical [name], [name] ([id number]) [id number] Page 8 of 11 Solution. No specimens were taken. A time out was conducted at 09:43, prior to the start of the procedure. A Minimum amount of bleeding was controlled with Pressure. The procedure was tolerated well. Post Debridement Measurements: 0.1cm length x 0.1cm width x 0.1cm depth; 0.001cm^3 volume. Character of Wound/Ulcer Post Debridement is improved. Severity of Tissue Post Debridement is: Fat layer exposed. Post procedure Diagnosis Wound #7: Same as Pre-Procedure General Notes: scribed for Dr. Celine Ahr by Adline Peals, RN. Pre-procedure diagnosis of Wound #7 is a Venous Leg Ulcer located on the Right,Lateral Ankle . There was a Double Layer Compression Therapy Procedure by Adline Peals, RN. Post procedure Diagnosis Wound #7: Same as Pre-Procedure Plan Follow-up Appointments: Return Appointment in 1 week. - Dr.  Celine Ahr rm 2 Other: - Vein and Vascular Speak with Damita Dunnings about venous studies phone [phone]. Anesthetic: (In clinic) Topical  Lidocaine 4% applied to wound bed Bathing/ Shower/ Hygiene: May shower with protection but do not get wound dressing(s) wet. Protect dressing(s) with water repellant cover (for example, large plastic bag) or a cast cover and may then take shower. Edema Control - Lymphedema / SCD / Other: Lymphedema Pumps. Use Lymphedema pumps on leg(s) 2-3 times a day for 45-60 minutes. If wearing any wraps or hose, do not remove them. Continue exercising as instructed. Avoid standing for long periods of time. Exercise regularly Compression stocking or Garment 20-30 mm/Hg pressure to: - toleftleg daily The following medication(s) was prescribed: lidocaine topical 4 % cream cream topical was prescribed at facility WOUND #7: - Ankle Wound Laterality: Right, Lateral Cleanser: Soap and Water 1 x Per Week/7 Days Discharge Instructions: May shower and wash wound with dial antibacterial soap and water prior to dressing change. Cleanser: Wound Cleanser 1 x Per Week/7 Days Discharge Instructions: Cleanse the wound with wound cleanser prior to applying a clean dressing using gauze sponges, not tissue or cotton balls. Peri-Wound Care: Sween Lotion (Moisturizing lotion) 1 x Per Week/7 Days Discharge Instructions: Apply moisturizing lotion as directed Topical: Mupirocin Ointment 1 x Per Week/7 Days Discharge Instructions: Apply Mupirocin (Bactroban) as instructed Prim Dressing: Promogran Prisma Matrix, 4.34 (sq in) (silver collagen) 1 x Per Week/7 Days ary Discharge Instructions: Moisten collagen with saline or hydrogel Secondary Dressing: Woven Gauze Sponge, Non-Sterile 4x4 in 1 x Per Week/7 Days Discharge Instructions: Apply over primary dressing as directed. Com pression Wrap: CoFlex TLC XL 2-layer Compression System 4x7 (in/yd) 1 x Per Week/7 Days Discharge Instructions: Apply CoFlex  2-layer with ZINC compression as directed. (alt for 4 layer) 04/07/2022: The lateral ankle wound is down to just a couple of millimeters. Edema control is excellent. I used a curette to debride the eschar off of the wound. We will continue topical mupirocin, Prisma silver collagen, and 3 layer compression. Follow-up in 1 week. Hopefully he will be closed at that time. Electronic Signature(s) Signed: 04/07/2022 10:10:54 AM By: Fredirick Maudlin MD FACS Signed: 04/07/2022 4:17:15 PM By: Adline Peals Previous Signature: 04/07/2022 9:49:03 AM Version By: Fredirick Maudlin MD FACS Entered By: Adline Peals on 04/07/2022 10:08:14 -------------------------------------------------------------------------------- HxROS Details Patient Name: Date of Service: Marvin Atlas, PA UL [name] 04/07/2022 9:15 A M Medical Record Number: [id number] Patient Account Number: [id number] Date of Birth/Sex: Treating RN: [birth year] (61 y.o. M) Primary Care Provider: Evonnie Dawes, Jenny Reichmann Other Clinician: Referring Provider: Treating Provider/Extender: Evette Georges in Treatment: [place] From [name], [name] ([id number]) [id number] Page 9 of 11 Patient Eyes Medical History: Negative for: Cataracts; Glaucoma; Optic Neuritis Ear/Nose/Mouth/Throat Medical History: Negative for: Chronic sinus problems/congestion; Middle ear problems Hematologic/Lymphatic Medical History: Positive for: Anemia Negative for: Hemophilia; Human Immunodeficiency Virus; Lymphedema; Sickle Cell Disease Respiratory Medical History: Positive for: Sleep Apnea Negative for: Aspiration; Asthma; Chronic Obstructive Pulmonary Disease (COPD); Pneumothorax; Tuberculosis Cardiovascular Medical History: Positive for: Congestive Heart Failure; Coronary Artery Disease; Peripheral Venous Disease Negative for: Angina; Arrhythmia; Deep Vein Thrombosis; Hypertension; Hypotension; Myocardial  Infarction; Peripheral Arterial Disease; Phlebitis; Vasculitis Gastrointestinal Medical History: Negative for: Cirrhosis ; Colitis; Crohns; Hepatitis A; Hepatitis B; Hepatitis C Past Medical History Notes: gastric bypass Endocrine Medical History: Negative for: Type I Diabetes; Type II Diabetes Genitourinary Medical History: Negative for: End Stage Renal Disease Immunological Medical History: Negative for: Lupus Erythematosus; Raynauds; Scleroderma Integumentary (Skin) Medical History: Negative for: History of Burn Musculoskeletal Medical History: Positive for: Osteoarthritis Negative for: Gout; Rheumatoid Arthritis; Osteomyelitis Neurologic Medical  History: Negative for: Dementia; Neuropathy; Quadriplegia; Paraplegia; Seizure Disorder Oncologic Medical History: Negative for: Received Chemotherapy; Received Radiation Psychiatric Medical History: Negative for: Anorexia/bulimia; Confinement Anxiety Immunizations [name], [name] ([id number]) [id number] Page 10 of 11 Pneumococcal Vaccine: Received Pneumococcal Vaccination: Yes Received Pneumococcal Vaccination On or After 60th Birthday: Yes Implantable Devices None Hospitalization / Surgery History Type of Hospitalization/Surgery right hip nailing 10/17/2019 05/17/2018 nephrolithotomy left 01/29/2017 right lymph noes biopsy cystoscopy 2017, 2018 2017 gastric bypass surgery Family and Social History Cancer: Yes; Diabetes: Yes - Father; Heart Disease: No; Hereditary Spherocytosis: No; Hypertension: Yes - Father; Kidney Disease: No; Lung Disease: No; Seizures: No; Stroke: No; Thyroid Problems: No; Tuberculosis: No; Current every day smoker; Marital Status - Divorced; Alcohol Use: Never; Drug Use: Prior History; Caffeine Use: Rarely; Financial Concerns: No; Food, Clothing or Shelter Needs: No; Support System Lacking: No; Transportation Concerns: No Electronic Signature(s) Signed: 04/07/2022 10:10:54 AM  By: Fredirick Maudlin MD FACS Entered By: Fredirick Maudlin on 04/07/2022 09:47:58 -------------------------------------------------------------------------------- SuperBill Details Patient Name: Date of Service: Marvin Atlas, PA UL [name] 04/07/2022 Medical Record Number: [id number] Patient Account Number: [id number] Date of Birth/Sex: Treating RN: [birth year] (61 y.o. M) Primary Care Provider: Evonnie Dawes, Jenny Reichmann Other Clinician: Referring Provider: Treating Provider/Extender: Evette Georges in Treatment: 9 Diagnosis Coding ICD-10 Codes Code Description [phone] Chronic venous hypertension (idiopathic) with ulcer and inflammation of right lower extremity 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 Facility Procedures : CPT4 Code: [id number] Description: 84132 - DEBRIDE WOUND 1ST 20 SQ CM OR < ICD-10 Diagnosis Description L97.312 Non-pressure chronic ulcer of right ankle with fat layer exposed Modifier: Quantity: 1 Physician Procedures : CPT4 Code Description Modifier [id number] - WC PHYS LEVEL 4 - EST PT 25 ICD-10 Diagnosis Description L97.312 Non-pressure chronic ulcer of right ankle with fat layer exposed I87.331 Chronic venous hypertension (idiopathic) with ulcer and  inflammation of right lower extremity Quantity: 1 : [id number] - WC PHYS DEBR WO ANESTH 20 SQ CM ICD-10 Diagnosis Description L97.312 Non-pressure chronic ulcer of right ankle with fat layer exposed Quantity: 1 Electronic Signature(s) [name], [name] ([id number]) [id number] Page 11 of 11 Signed: 04/07/2022 9:49:18 AM By: Fredirick Maudlin MD FACS Entered By: Fredirick Maudlin on 04/07/2022 09:49:17

## 2022-04-07 NOTE — Progress Notes (Signed)
HUSEIN, GUEDES ([id number]) [id number] Page 1 of 8 Visit Report for 04/07/2022 Arrival Information Details Patient Name: Date of Service: [name], Marvin [place] J. 04/07/2022 9:15 A M Medical Record Number: [id number] Patient Account Number: [id number] Date of Birth/Sex: Treating RN: [birth year] (61 y.o. [name] Primary Care Gidget Quizhpi: Evonnie Dawes, Jenny Reichmann Other Clinician: Referring Mills Mitton: Treating Deyonte Cadden/Extender: Evette Georges in Treatment: 9 Visit Information History Since Last Visit Added or deleted any medications: No Patient Arrived: [name] Any new allergies or adverse reactions: No Arrival Time: 09:34 Had a fall or experienced change in No Accompanied By: self activities of daily living that may affect Transfer Assistance: None risk of falls: Patient Identification Verified: Yes Signs or symptoms of abuse/neglect since last visito No Secondary Verification Process Completed: Yes Hospitalized since last visit: No Patient Requires Transmission-Based Precautions: No Implantable device outside of the clinic excluding No Patient Has Alerts: No cellular tissue based products placed in the center since last visit: Has Dressing in Place as Prescribed: Yes Has Compression in Place as Prescribed: Yes Pain Present Now: No Electronic Signature(s) Signed: 04/07/2022 4:17:15 PM By: Adline Peals Entered By: Adline Peals on 04/07/2022 09:34:49 -------------------------------------------------------------------------------- Compression Therapy Details Patient Name: Date of Service: Marvin Atlas, [place] J. 04/07/2022 9:15 A M Medical Record Number: [id number] Patient Account Number: [id number] Date of Birth/Sex: Treating RN: 1960-12-10 (61 y.o. [name] Primary Care Kennis Buell: Evonnie Dawes, Jenny Reichmann Other Clinician: Referring Gwen Edler: Treating Audra Kagel/Extender: Evette Georges in Treatment:  9 Compression Therapy Performed for Wound Assessment: Wound #7 Right,Lateral Ankle Performed By: Clinician Adline Peals, RN Compression Type: Double Layer Post Procedure Diagnosis Same as Pre-procedure Electronic Signature(s) Signed: 04/07/2022 4:17:15 PM By: Sabas Sous By: Adline Peals on 04/07/2022 10:07:17 [name] ([id number]) [id number] Page 2 of 8 -------------------------------------------------------------------------------- Encounter Discharge Information Details Patient Name: Date of Service: [name], [place] 04/07/2022 9:15 A M Medical Record Number: [id number] Patient Account Number: [id number] Date of Birth/Sex: Treating RN: [birth year] (61 y.o. [name] Primary Care Sianne Tejada: Evonnie Dawes, Jenny Reichmann Other Clinician: Referring Shauntia Levengood: Treating Cristianna Cyr/Extender: Evette Georges in Treatment: 9 Encounter Discharge Information Items Post Procedure Vitals Discharge Condition: Stable Temperature (F): 98.2 Ambulatory Status: Cane Pulse (bpm): 73 Discharge Destination: Home Respiratory Rate (breaths/min): 18 Transportation: Private Auto Blood Pressure (mmHg): 150/73 Accompanied By: self Schedule Follow-up Appointment: Yes Clinical Summary of Care: Patient Declined Electronic Signature(s) Signed: 04/07/2022 4:17:15 PM By: Adline Peals Entered By: Adline Peals on 04/07/2022 10:09:51 -------------------------------------------------------------------------------- Lower Extremity Assessment Details Patient Name: Date of Service: Marvin Atlas, PA UL J. 04/07/2022 9:15 A M Medical Record Number: [id number] Patient Account Number: [id number] Date of Birth/Sex: Treating RN: Jan 03, 1961 (61 y.o. [name] Primary Care Halla Chopp: Evonnie Dawes, Jenny Reichmann Other Clinician: Referring Sonda Coppens: Treating Fifi Schindler/Extender: Evette Georges in Treatment: 9 Edema  Assessment Assessed: [Left: No] [Right: No] Edema: [Left: Ye] [Right: s] Calf Left: Right: Point of Measurement: 42.5 cm From Medial Instep 41.2 cm Ankle Left: Right: Point of Measurement: 11.5 cm From Medial Instep 25 cm Vascular Assessment Pulses: Dorsalis Pedis Palpable: [Right:Yes] Electronic Signature(s) Signed: 04/07/2022 4:17:15 PM By: Adline Peals Entered By: Adline Peals on 04/07/2022 09:40:12 Multi Wound Chart Details -------------------------------------------------------------------------------- [name] ([id number]) [id number] Page 3 of 8 Patient Name: Date of Service: [name], Marvin [place] J. 04/07/2022 9:15 A M Medical Record Number: [id number] Patient Account Number: [id number] Date of Birth/Sex: Treating RN: 02/20/1961 (61 y.o.  M) Primary Care Pharoah Goggins: Evonnie Dawes, Jenny Reichmann Other Clinician: Referring Siara Gorder: Treating Lizzie An/Extender: Evette Georges in Treatment: 9 Vital Signs Height(in): 71 Pulse(bpm): 73 Weight(lbs): 230 Blood Pressure(mmHg): 150/73 Body Mass Index(BMI): 32.1 Temperature(F): 98.2 Respiratory Rate(breaths/min): 18 Wound Assessments Wound Number: 7 N/A N/A Photos: N/A N/A Right, Lateral Ankle N/A N/A Wound Location: Gradually Appeared N/A N/A Wounding Event: Venous Leg Ulcer N/A N/A Primary Etiology: Anemia, Sleep Apnea, Congestive N/A N/A Comorbid History: Heart Failure, Coronary Artery Disease, Peripheral Venous Disease, Osteoarthritis 01/11/2022 N/A N/A Date Acquired: 9 N/A N/A Weeks of Treatment: Open N/A N/A Wound Status: No N/A N/A Wound Recurrence: 0.1x0.1x0.1 N/A N/A Measurements L x W x D (cm) 0.008 N/A N/A A (cm) : rea 0.001 N/A N/A Volume (cm) : 99.60% N/A N/A % Reduction in A rea: 99.80% N/A N/A % Reduction in Volume: Full Thickness With Exposed Support N/A N/A Classification: Structures None Present N/A N/A Exudate A mount: Distinct,  outline attached N/A N/A Wound Margin: None Present (0%) N/A N/A Granulation A mount: Large (67-100%) N/A N/A Necrotic A mount: Eschar N/A N/A Necrotic Tissue: Fascia: No N/A N/A Exposed Structures: Fat Layer (Subcutaneous Tissue): No Tendon: No Muscle: No Joint: No Bone: No Limited to Skin Breakdown Large (67-100%) N/A N/A Epithelialization: Debridement - Selective/Open Wound N/A N/A Debridement: Pre-procedure Verification/Time Out 09:43 N/A N/A Taken: Lidocaine 4% Topical Solution N/A N/A Pain Control: Necrotic/Eschar N/A N/A Tissue Debrided: Non-Viable Tissue N/A N/A Level: 0.04 N/A N/A Debridement A (sq cm): rea Curette N/A N/A Instrument: Minimum N/A N/A Bleeding: Pressure N/A N/A Hemostasis A chieved: Procedure was tolerated well N/A N/A Debridement Treatment Response: 0.1x0.1x0.1 N/A N/A Post Debridement Measurements L x W x D (cm) 0.001 N/A N/A Post Debridement Volume: (cm) Excoriation: No N/A N/A Periwound Skin Texture: Induration: No Callus: No Crepitus: No Rash: No Scarring: No Maceration: No N/A N/A Periwound Skin Moisture: Dry/Scaly: No Atrophie Blanche: No N/A N/A Periwound Skin Color: Cyanosis: No Ecchymosis: No Erythema: No [name], [name] ([id number]) [id number] Page 4 of 8 Hemosiderin Staining: No Mottled: No Pallor: No Rubor: No No Abnormality N/A N/A Temperature: Yes N/A N/A Tenderness on Palpation: Debridement N/A N/A Procedures Performed: Treatment Notes Electronic Signature(s) Signed: 04/07/2022 9:47:08 AM By: Fredirick Maudlin MD FACS Entered By: Fredirick Maudlin on 04/07/2022 09:47:08 -------------------------------------------------------------------------------- Multi-Disciplinary Care Plan Details Patient Name: Date of Service: Marvin Atlas, PA UL J. 04/07/2022 9:15 A M Medical Record Number: [id number] Patient Account Number: [id number] Date of Birth/Sex: Treating RN: 10/29/1960 (61 y.o. [name] Primary Care Noemie Devivo: Evonnie Dawes, Jenny Reichmann Other Clinician: Referring Sharalee Witman: Treating Jaylanni Eltringham/Extender: Evette Georges in Treatment: 9 Multidisciplinary Care Plan reviewed with physician Active Inactive Venous Leg Ulcer Nursing Diagnoses: Actual venous Insuffiency (use after diagnosis is confirmed) Knowledge deficit related to disease process and management Potential for venous Insuffiency (use before diagnosis confirmed) Goals: Non-invasive venous studies are completed as ordered Date Initiated: 02/03/2022 Target Resolution Date: 05/15/2022 Goal Status: Active Patient will maintain optimal edema control Date Initiated: 02/03/2022 Target Resolution Date: 05/15/2022 Goal Status: Active Patient/caregiver will verbalize understanding of disease process and disease management Date Initiated: 02/03/2022 Date Inactivated: 02/17/2022 Target Resolution Date: 02/17/2022 Goal Status: Met Interventions: Assess peripheral edema status every visit. Compression as ordered Provide education on venous insufficiency Notes: Wound/Skin Impairment Nursing Diagnoses: Impaired tissue integrity Knowledge deficit related to ulceration/compromised skin integrity Goals: Patient will have a decrease in wound volume by X% from date: (specify in notes) Date Initiated: 02/03/2022 Target  Resolution Date: 05/15/2022 Goal Status: Active Patient/caregiver will verbalize understanding of skin care regimen Date Initiated: 02/03/2022 Date Inactivated: 03/18/2022 Target Resolution Date: 03/12/2022 Goal Status: Met Ulcer/skin breakdown will have a volume reduction of 30% by week 4 [name], [name] ([id number]) [id number] Page 5 of 8 Date Initiated: 02/03/2022 Date Inactivated: 03/11/2022 Target Resolution Date: 02/26/2022 Goal Status: Met Ulcer/skin breakdown will have a volume reduction of 50% by week 8 Date Initiated: 03/11/2022 Target  Resolution Date: 05/15/2022 Goal Status: Active Interventions: Assess patient/caregiver ability to obtain necessary supplies Assess patient/caregiver ability to perform ulcer/skin care regimen upon admission and as needed Assess ulceration(s) every visit Notes: Electronic Signature(s) Signed: 04/07/2022 4:17:15 PM By: Adline Peals Entered By: Adline Peals on 04/07/2022 10:07:36 -------------------------------------------------------------------------------- Pain Assessment Details Patient Name: Date of Service: Marvin Atlas, PA UL J. 04/07/2022 9:15 A M Medical Record Number: [id number] Patient Account Number: [id number] Date of Birth/Sex: Treating RN: 01-Feb-1961 (61 y.o. [name] Primary Care Tavaria Mackins: Evonnie Dawes, Jenny Reichmann Other Clinician: Referring Maripaz Mullan: Treating Zyshawn Bohnenkamp/Extender: Evette Georges in Treatment: 9 Active Problems Location of Pain Severity and Description of Pain Patient Has Paino No Site Locations Rate the pain. Current Pain Level: 0 Pain Management and Medication Current Pain Management: Electronic Signature(s) Signed: 04/07/2022 4:17:15 PM By: Adline Peals Entered By: Adline Peals on 04/07/2022 09:35:09 Patient/Caregiver Education Details -------------------------------------------------------------------------------- [name] ([id number]) [id number] Page 6 of 8 Patient Name: Date of Service: [name], Marvin [place] 12/26/2023andnbsp9:15 A M Medical Record Number: [id number] Patient Account Number: [id number] Date of Birth/Gender: Treating RN: [birth year] (61 y.o. [name] Primary Care Physician: Evonnie Dawes, Jenny Reichmann Other Clinician: Referring Physician: Treating Physician/Extender: Evette Georges in Treatment: 9 Education Assessment Education Provided To: Patient Education Topics Provided Wound/Skin Impairment: Methods:  Explain/Verbal Responses: Reinforcements needed, State content correctly [employer]) Signed: 04/07/2022 4:17:15 PM By: Adline Peals Entered By: Adline Peals on 04/07/2022 10:07:46 -------------------------------------------------------------------------------- Wound Assessment Details Patient Name: Date of Service: Marvin Atlas, PA UL J. 04/07/2022 9:15 A M Medical Record Number: [id number] Patient Account Number: [id number] Date of Birth/Sex: Treating RN: 1960/09/04 (61 y.o. [name] Primary Care Nikodem Leadbetter: Evonnie Dawes, Jenny Reichmann Other Clinician: Referring Octave Montrose: Treating Eniola Cerullo/Extender: Evette Georges in Treatment: 9 Wound Status Wound Number: 7 Primary Venous Leg Ulcer Etiology: Wound Location: Right, Lateral Ankle Wound Open Wounding Event: Gradually Appeared Status: Date Acquired: 01/11/2022 Comorbid Anemia, Sleep Apnea, Congestive Heart Failure, Coronary Artery Weeks Of Treatment: 9 History: Disease, Peripheral Venous Disease, Osteoarthritis Clustered Wound: No Photos Wound Measurements Length: (cm) 0.1 Width: (cm) 0.1 Depth: (cm) 0.1 Area: (cm) 0.008 Volume: (cm) 0.001 % Reduction in Area: 99.6% % Reduction in Volume: 99.8% Epithelialization: Large (67-100%) Tunneling: No Undermining: No Wound Description Classification: Full Thickness With Exposed Support Wound Margin: Distinct, outline attached [name], [name] ([id number]) Exudate Amount: None Present Structures Foul Odor After Cleansing: No Slough/Fibrino No [id number] Page 7 of 8 Wound Bed Granulation Amount: None Present (0%) Exposed Structure Necrotic Amount: Large (67-100%) Fascia Exposed: No Necrotic Quality: Eschar Fat Layer (Subcutaneous Tissue) Exposed: No Tendon Exposed: No Muscle Exposed: No Joint Exposed: No Bone Exposed: No Limited to Skin Breakdown Periwound Skin Texture Texture Color No Abnormalities  Noted: Yes No Abnormalities Noted: Yes Moisture Temperature / Pain No Abnormalities Noted: Yes Temperature: No Abnormality Tenderness on Palpation: Yes Treatment Notes Wound #7 (Ankle) Wound Laterality: Right, Lateral Cleanser Soap and Water Discharge Instruction: May shower and wash wound with dial antibacterial soap  and water prior to dressing change. Wound Cleanser Discharge Instruction: Cleanse the wound with wound cleanser prior to applying a clean dressing using gauze sponges, not tissue or cotton balls. Peri-Wound Care Sween Lotion (Moisturizing lotion) Discharge Instruction: Apply moisturizing lotion as directed Topical Mupirocin Ointment Discharge Instruction: Apply Mupirocin (Bactroban) as instructed Primary Dressing Promogran Prisma Matrix, 4.34 (sq in) (silver collagen) Discharge Instruction: Moisten collagen with saline or hydrogel Secondary Dressing Woven Gauze Sponge, Non-Sterile 4x4 in Discharge Instruction: Apply over primary dressing as directed. Secured With Compression Wrap CoFlex TLC XL 2-layer Compression System 4x7 (in/yd) Discharge Instruction: Apply CoFlex 2-layer with ZINC compression as directed. (alt for 4 layer) Compression Stockings Add-Ons Electronic Signature(s) Signed: 04/07/2022 4:17:15 PM By: Adline Peals Entered By: Adline Peals on 04/07/2022 09:41:56 -------------------------------------------------------------------------------- Vitals Details Patient Name: Date of Service: Marvin Atlas, PA UL J. 04/07/2022 9:15 A M Medical Record Number: [id number] Patient Account Number: [id number] Date of Birth/Sex: Treating RN: Sep 07, 1960 (61 y.o. [name] Primary Care Markeshia Giebel: Evonnie Dawes, Jenny Reichmann Other Clinician: Referring Vickki Igou: Treating Chrisotpher Rivero/Extender: [name], [name] ([id number]) [id number] Page 8 of 8 Weeks in Treatment: 9 Vital Signs Time Taken:  09:34 Temperature (F): 98.2 Height (in): 71 Pulse (bpm): 73 Weight (lbs): 230 Respiratory Rate (breaths/min): 18 Body Mass Index (BMI): 32.1 Blood Pressure (mmHg): 150/73 Reference Range: 80 - 120 mg / dl Electronic Signature(s) Signed: 04/07/2022 4:17:15 PM By: Adline Peals Entered By: Adline Peals on 04/07/2022 09:35:03

## 2022-04-09 ENCOUNTER — Telehealth: Payer: Self-pay

## 2022-04-09 NOTE — Patient Outreach (Signed)
  Care Coordination   04/09/2022 Name: [name] MRN: [id number] DOB: 1960/05/26   Care Coordination Outreach Attempts:  An unsuccessful telephone outreach was attempted today to offer the patient information about available care coordination services as a benefit of their health plan.   Follow Up Plan:  Additional outreach attempts will be made to offer the patient care coordination information and services.   Encounter Outcome:  No Answer   Care Coordination Interventions:  No, not indicated    Tomasa Rand, RN, BSN, [facility] [employer] [phone]

## 2022-04-10 ENCOUNTER — Ambulatory Visit: Payer: PPO | Attending: Cardiology | Admitting: Cardiology

## 2022-04-10 ENCOUNTER — Encounter: Payer: Self-pay | Admitting: Cardiology

## 2022-04-10 VITALS — BP 110/70 | HR 66 | Ht 70.0 in | Wt 223.0 lb

## 2022-04-10 DIAGNOSIS — Z01810 Encounter for preprocedural cardiovascular examination: Secondary | ICD-10-CM | POA: Diagnosis not present

## 2022-04-10 DIAGNOSIS — E785 Hyperlipidemia, unspecified: Secondary | ICD-10-CM | POA: Diagnosis not present

## 2022-04-10 DIAGNOSIS — M170 Bilateral primary osteoarthritis of knee: Secondary | ICD-10-CM

## 2022-04-10 DIAGNOSIS — I2510 Atherosclerotic heart disease of native coronary artery without angina pectoris: Secondary | ICD-10-CM

## 2022-04-10 MED ORDER — ASPIRIN 81 MG PO TBEC: 81.0000 mg | DELAYED_RELEASE_TABLET | Freq: Every day | ORAL | 3 refills | Status: DC

## 2022-04-10 MED ORDER — ATORVASTATIN CALCIUM 10 MG PO TABS: 10.0000 mg | ORAL_TABLET | Freq: Every day | ORAL | 3 refills | Status: AC

## 2022-04-10 NOTE — Patient Instructions (Signed)
Medication Instructions:   START: Aspirin '81mg'$  Enteric Coated- 1 daily  START: Lipitor '10mg'$  1 table daily   Lab Work: Your physician recommends that you return for lab work in: 6 weeks You need to have labs done when you are fasting.  You can come Monday through Friday 8:30 am to 12:00 pm and 1:15 to 4:30. You do not need to make an appointment as the order has already been placed. The labs you are going to have done are AST, ALT and Lipids.    Testing/Procedures: Your physician has requested that you have an echocardiogram. Echocardiography is a painless test that uses sound waves to create images of your heart. It provides your doctor with information about the size and shape of your heart and how well your heart's chambers and valves are working. This procedure takes approximately one hour. There are no restrictions for this procedure. Please do NOT wear cologne, perfume, aftershave, or lotions (deodorant is allowed). Please arrive 15 minutes prior to your appointment time.    Follow-Up: At [facility], you and your health needs are our priority.  As part of our continuing mission to provide you with exceptional heart care, we have created designated Provider Care Teams.  These Care Teams include your primary Cardiologist (physician) and Advanced Practice Providers (APPs -  Physician Assistants and Nurse Practitioners) who all work together to provide you with the care you need, when you need it.  We recommend signing up for the patient portal called "MyChart".  Sign up information is provided on this After Visit Summary.  MyChart is used to connect with patients for Virtual Visits (Telemedicine).  Patients are able to view lab/test results, encounter notes, upcoming appointments, etc.  Non-urgent messages can be sent to your provider as well.   To learn more about what you can do with MyChart, go to [email or web address].    Your next appointment:   6 month(s)  The format for your  next appointment:   In Person  Provider:   [name], MD    Other Instructions NA

## 2022-04-10 NOTE — Progress Notes (Signed)
Cardiology Office Note:    Date:  04/10/2022   ID:  Gena Fray., DOB [birth year], MRN [id number]  PCP:  [name], MD  Cardiologist:  [name], MD    Referring MD: [name], MD   Chief Complaint  Patient presents with   Follow-up   Pre-op Exam    History of Present Illness:    Ole Lafon. is a 61 y.o. male past medical history significant for coronary artery disease, diastolic congestive heart failure, arthritis, morbid obesity, lymphedema.  He was referred to [place] for evaluation before surgery.  He does have history in the chart for coronary artery disease however not good documentation, coronary CT angio has been performed which showed tightest lesion in obtuse marginal branch a 25 to 49% stenosis otherwise few lesions up to 25% nothing hemodynamically obstructive.  He is doing well denies have any chest pain tightness squeezing pressure burning chest, have difficulty walking because arthritis in his knees  Past Medical History:  Diagnosis Date   Anemia    Arthritis    CAD (coronary artery disease)    CHF (congestive heart failure) ([place])    PT. DENIES AT PREOP   Depression    Heart murmur    hx of small murmur    Hepatitis    Hepatitis C not treated for Hx of   History of kidney stones    Lymphocele    Right groin   Morbid obesity ([place])    Pneumonia    Sleep apnea    hx of had gastic bypass   Venous insufficiency of leg    Venous stasis dermatitis     Past Surgical History:  Procedure Laterality Date   CARPAL TUNNEL RELEASE     bilateral    CORONARY ARTERY BYPASS GRAFT     CYSTOSCOPY W/ URETERAL STENT PLACEMENT Bilateral 04/21/2016   Procedure: CYSTOSCOPY WITH RETROGRADE PYELOGRAM/URETERAL STENT PLACEMENT;  Surgeon: [name], MD;  Location: [facility];  Service: Urology;  Laterality: Bilateral;   CYSTOSCOPY/URETEROSCOPY/HOLMIUM LASER/STENT PLACEMENT Bilateral 04/01/2016   Procedure: CYSTOSCOPY WITH RETROGRADE AND   STENT PLACEMENT;  Surgeon: [name], MD;  Location: [facility];  Service: Urology;  Laterality: Bilateral;   CYSTOSCOPY/URETEROSCOPY/HOLMIUM LASER/STENT PLACEMENT Bilateral 04/21/2016   Procedure: CYSTOSCOPY/URETEROSCOPY/HOLMIUM LASER/STENT REPLACEMENT;  Surgeon: [name], MD;  Location: [facility];  Service: Urology;  Laterality: Bilateral;   EYE SURGERY     bil cataracts   gastric bypass surgery   10/2015   GASTROPLASTY DUODENAL SWITCH  10/17/2015   HERNIA REPAIR     HIP FRACTURE SURGERY Right 10/17/2019   Using Gamma Nail performed by Dr. Donivan Scull   HOLMIUM LASER APPLICATION Bilateral 06/12/5174   Procedure: HOLMIUM LASER APPLICATION;  Surgeon: [name], MD;  Location: [facility];  Service: Urology;  Laterality: Bilateral;   IR URETERAL STENT LEFT NEW ACCESS W/O SEP NEPHROSTOMY CATH  05/17/2018   LYMPH NODE BIOPSY Right 01/29/2017   Inguinal, performed by Dr. Noberto Retort   NEPHROLITHOTOMY Left 05/17/2018   Procedure: NEPHROLITHOTOMY PERCUTANEOUS;  Surgeon: [name], MD;  Location: [facility];  Service: Urology;  Laterality: Left;    Current Medications: Current Meds  Medication Sig   methadone (METHADOSE) 40 MG disintegrating tablet Take 195 mg by mouth daily.      Allergies:   Patient has no known allergies.   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  Lives in [place], [place].  4 children all in good health.  3 sisters in good health.  Father in poor health near by   Social Determinants of Health   Financial Resource Strain: Not on file  Food Insecurity: Not on file  Transportation Needs: Not on file  Physical Activity: Not on file  Stress: Not on file  Social Connections: Not on file     Family History: The patient's family history includes Colon cancer in his father; Diabetes in his father; Heart disease in his father; Hypertension in his father; Leukemia in his mother. ROS:   Please see the history of present illness.    All 14 point review of systems negative except as described per history of present illness  EKGs/Labs/Other Studies Reviewed:      Recent Labs: 12/23/2021: BUN 10; Creatinine, Ser 0.74; Potassium 4.2; Sodium 141  Recent Lipid Panel    Component Value Date/Time   CHOL 149 12/23/2021 1502   TRIG 70 12/23/2021 1502   HDL 53 12/23/2021 1502   CHOLHDL 2.8 12/23/2021 1502   LDLCALC 82 12/23/2021 1502    Physical Exam:    VS:  BP 110/70 (BP Location: Right Arm, Patient Position: Sitting, Cuff Size: Normal)   Pulse 66   Ht '5\' 10"'$  (1.778 m)   Wt 223 lb (101.2 kg)   SpO2 96%   BMI 32.00 kg/m     Wt Readings from Last 3 Encounters:  04/10/22 223 lb (101.2 kg)  12/23/21 231 lb 6.4 oz (105 kg)  01/17/20 215 lb (97.5 kg)     GEN:  Well nourished, well developed in no acute distress HEENT: Normal NECK: No JVD; No carotid bruits LYMPHATICS: No lymphadenopathy CARDIAC: RRR, no murmurs, no rubs, no gallops RESPIRATORY:  Clear to auscultation without rales, wheezing or rhonchi  ABDOMEN: Soft, non-tender, non-distended MUSCULOSKELETAL:  No edema; No deformity  SKIN: Warm and dry LOWER EXTREMITIES: no  swelling NEUROLOGIC:  Alert and oriented x 3 PSYCHIATRIC:  Normal affect   ASSESSMENT:    1. Preop cardiovascular exam   2. Coronary artery disease involving native coronary artery of native heart without angina pectoris   3. Bilateral primary osteoarthritis of knee    PLAN:    In order of problems listed above:  Coronary disease.  Not critical.  Will initiate antiplatelet therapy in form of aspirin 81 daily. Dyslipidemia I did review K PN which show me LDL 82 HDL 53 but now with his coronary artery disease he need to be on moderate intensity statin, therefore, I will put  him on Lipitor 10 daily, fasting lipid profile AST LT will be done in about 6 weeks. History of heart murmur.  I cannot appreciate this on physical exam however echocardiogram will be done to assess left ventricle ejection fraction. Arthritis cardiovascular evaluation before surgery for knee replacement.  Future recommendation will be made based on results on echocardiogram, if echocardiogram is fine she would be appropriate to proceed with surgery as scheduled.   Medication Adjustments/Labs and Tests Ordered: Current medicines are reviewed at length with the patient today.  Concerns regarding medicines are outlined above.  No orders of the defined types were placed in this encounter.  Medication changes: No orders of the defined types were placed in this encounter.   Signed, Park Liter, MD, [facility] 04/10/2022 1:48 PM    [facility]

## 2022-04-14 ENCOUNTER — Encounter (HOSPITAL_BASED_OUTPATIENT_CLINIC_OR_DEPARTMENT_OTHER): Payer: PPO | Attending: General Surgery | Admitting: General Surgery

## 2022-04-14 DIAGNOSIS — Z09 Encounter for follow-up examination after completed treatment for conditions other than malignant neoplasm: Secondary | ICD-10-CM | POA: Insufficient documentation

## 2022-04-14 DIAGNOSIS — I87331 Chronic venous hypertension (idiopathic) with ulcer and inflammation of right lower extremity: Secondary | ICD-10-CM | POA: Diagnosis not present

## 2022-04-14 DIAGNOSIS — I87301 Chronic venous hypertension (idiopathic) without complications of right lower extremity: Secondary | ICD-10-CM | POA: Insufficient documentation

## 2022-04-14 DIAGNOSIS — L97312 Non-pressure chronic ulcer of right ankle with fat layer exposed: Secondary | ICD-10-CM | POA: Diagnosis not present

## 2022-04-14 DIAGNOSIS — Z872 Personal history of diseases of the skin and subcutaneous tissue: Secondary | ICD-10-CM | POA: Diagnosis not present

## 2022-04-14 NOTE — Progress Notes (Signed)
Marvin, [name] ([id number]) [id number] Page 1 of 8 Visit Report for 04/14/2022 Arrival Information Details Patient Name: Date of Service: [name], [place] J. 04/14/2022 9:15 A M Medical Record Number: [id number] Patient Account Number: [id number] Date of Birth/Sex: Treating RN: 07-09-1960 (62 y.o. [name] Primary Care Durinda Buzzelli: Evonnie Dawes, Jenny Reichmann Other Clinician: Referring Sokhna Christoph: Treating Marckus Hanover/Extender: Evette Georges in Treatment: 10 Visit Information History Since Last Visit Added or deleted any medications: No Patient Arrived: [name] Any new allergies or adverse reactions: No Arrival Time: 09:34 Had a fall or experienced change in No Accompanied By: self activities of daily living that may affect Transfer Assistance: None risk of falls: Patient Identification Verified: Yes Signs or symptoms of abuse/neglect since last visito No Secondary Verification Process Completed: Yes Hospitalized since last visit: No Patient Requires Transmission-Based Precautions: No Implantable device outside of the clinic excluding No Patient Has Alerts: No cellular tissue based products placed in the center since last visit: Has Dressing in Place as Prescribed: Yes Has Compression in Place as Prescribed: Yes Pain Present Now: No Electronic Signature(s) Signed: 04/14/2022 3:22:54 PM By: Adline Peals Entered By: Adline Peals on 04/14/2022 09:36:42 -------------------------------------------------------------------------------- Clinic Level of Care Assessment Details Patient Name: Date of Service: [name], [name] 04/14/2022 9:15 A M Medical Record Number: [id number] Patient Account Number: [id number] Date of Birth/Sex: Treating RN: 1961-04-01 (62 y.o. [name] Primary Care [name]: Evonnie Dawes, Jenny Reichmann Other Clinician: Referring Brazil Voytko: Treating [name]/Extender: Evette Georges in Treatment:  10 Clinic Level of Care Assessment Items TOOL 4 Quantity Score X- 1 0 Use when only an EandM is performed on FOLLOW-UP visit ASSESSMENTS - Nursing Assessment / Reassessment X- 1 10 Reassessment of Co-morbidities (includes updates in patient status) X- 1 5 Reassessment of Adherence to Treatment Plan ASSESSMENTS - Wound and Skin A ssessment / Reassessment X - Simple Wound Assessment / Reassessment - one wound 1 5 '[]'$  - 0 Complex Wound Assessment / Reassessment - multiple wounds '[]'$  - 0 Dermatologic / Skin Assessment (not related to wound area) ASSESSMENTS - Focused Assessment X- 1 5 Circumferential Edema Measurements - multi extremities '[]'$  - 0 Nutritional Assessment / Counseling / Intervention JACHOB, MCCLEAN ([id number]) [phone].pdf Page 2 of 8 X- 1 5 Lower Extremity Assessment (monofilament, tuning fork, pulses) '[]'$  - 0 Peripheral Arterial Disease Assessment (using hand held doppler) ASSESSMENTS - Ostomy and/or Continence Assessment and Care '[]'$  - 0 Incontinence Assessment and Management '[]'$  - 0 Ostomy Care Assessment and Management (repouching, etc.) PROCESS - Coordination of Care X - Simple Patient / Family Education for ongoing care 1 15 '[]'$  - 0 Complex (extensive) Patient / Family Education for ongoing care X- 1 10 Staff obtains Programmer, systems, Records, T Results / Process Orders est '[]'$  - 0 Staff telephones HHA, Nursing Homes / Clarify orders / etc '[]'$  - 0 Routine Transfer to another Facility (non-emergent condition) '[]'$  - 0 Routine Hospital Admission (non-emergent condition) '[]'$  - 0 New Admissions / Biomedical engineer / Ordering NPWT Apligraf, etc. , '[]'$  - 0 Emergency Hospital Admission (emergent condition) X- 1 10 Simple Discharge Coordination '[]'$  - 0 Complex (extensive) Discharge Coordination PROCESS - Special Needs '[]'$  - 0 Pediatric / Minor Patient Management '[]'$  - 0 Isolation Patient Management '[]'$  - 0 Hearing / Language / Visual special  needs '[]'$  - 0 Assessment of Community assistance (transportation, D/C planning, etc.) '[]'$  - 0 Additional assistance / Altered mentation '[]'$  - 0 Support Surface(s) Assessment (bed, cushion,  seat, etc.) INTERVENTIONS - Wound Cleansing / Measurement X - Simple Wound Cleansing - one wound 1 5 '[]'$  - 0 Complex Wound Cleansing - multiple wounds X- 1 5 Wound Imaging (photographs - any number of wounds) '[]'$  - 0 Wound Tracing (instead of photographs) X- 1 5 Simple Wound Measurement - one wound '[]'$  - 0 Complex Wound Measurement - multiple wounds INTERVENTIONS - Wound Dressings X - Small Wound Dressing one or multiple wounds 1 10 '[]'$  - 0 Medium Wound Dressing one or multiple wounds '[]'$  - 0 Large Wound Dressing one or multiple wounds '[]'$  - 0 Application of Medications - topical '[]'$  - 0 Application of Medications - injection INTERVENTIONS - Miscellaneous '[]'$  - 0 External ear exam '[]'$  - 0 Specimen Collection (cultures, biopsies, blood, body fluids, etc.) '[]'$  - 0 Specimen(s) / Culture(s) sent or taken to Lab for analysis '[]'$  - 0 Patient Transfer (multiple staff / Civil Service fast streamer / Similar devices) '[]'$  - 0 Simple Staple / Suture removal (25 or less) '[]'$  - 0 Complex Staple / Suture removal (26 or more) '[]'$  - 0 Hypo / Hyperglycemic Management (close monitor of Blood Glucose) DAWN, KIPER ([id number]) [id number].pdf Page 3 of 8 '[]'$  - 0 Ankle / Brachial Index (ABI) - do not check if billed separately X- 1 5 Vital Signs Has the patient been seen at the hospital within the last three years: Yes Total Score: 95 Level Of Care: New/Established - Level 3 Electronic Signature(s) Signed: 04/14/2022 3:22:54 PM By: Adline Peals Entered By: Adline Peals on 04/14/2022 09:51:21 -------------------------------------------------------------------------------- Encounter Discharge Information Details Patient Name: Date of Service: Marvin Atlas, [place] J. 04/14/2022 9:15 A M Medical Record  Number: [id number] Patient Account Number: [id number] Date of Birth/Sex: Treating RN: [birth year] (62 y.o. [name] Primary Care [name] Aerts: Evonnie Dawes, Jenny Reichmann Other Clinician: Referring Shauna Bodkins: Treating Meliton Samad/Extender: Evette Georges in Treatment: 10 Encounter Discharge Information Items Discharge Condition: Stable Ambulatory Status: Cane Discharge Destination: Home Transportation: Private Auto Accompanied By: self Schedule Follow-up Appointment: Yes Clinical Summary of Care: Patient Declined Electronic Signature(s) Signed: 04/14/2022 3:22:54 PM By: Adline Peals Entered By: Adline Peals on 04/14/2022 09:50:51 -------------------------------------------------------------------------------- Lower Extremity Assessment Details Patient Name: Date of Service: Marvin Atlas, [place] UL J. 04/14/2022 9:15 A M Medical Record Number: [id number] Patient Account Number: [id number] Date of Birth/Sex: Treating RN: 01-12-1961 (62 y.o. [name] Primary Care Zayde Stroupe: Evonnie Dawes, Jenny Reichmann Other Clinician: Referring Cari Burgo: Treating Sanjana Folz/Extender: Evette Georges in Treatment: 10 Edema Assessment Assessed: [Left: No] [Right: No] Edema: [Left: Ye] [Right: s] Calf Left: Right: Point of Measurement: 42.5 cm From Medial Instep 39.1 cm Ankle Left: Right: Point of Measurement: 11.5 cm From Medial Instep 23.4 cm Vascular Assessment Left: [id number] Page 4 of 8Right:] Pulses: Dorsalis Pedis Palpable: [phone].pdf Page 4 of 8Yes] Electronic Signature(s) Signed: 04/14/2022 3:22:54 PM By: Adline Peals Entered By: Adline Peals on 04/14/2022 09:37:35 -------------------------------------------------------------------------------- Multi Wound Chart Details Patient Name: Date of Service: Marvin Atlas, PA [place] J. 04/14/2022 9:15 A M Medical Record Number: [id number] Patient  Account Number: [id number] Date of Birth/Sex: Treating RN: 1961-01-25 (62 y.o. M) Primary Care Aaric Dolph: Evonnie Dawes, Jenny Reichmann Other Clinician: Referring Sebastyan Snodgrass: Treating Gurnoor Sloop/Extender: Evette Georges in Treatment: 10 Vital Signs Height(in): 71 Pulse(bpm): 70 Weight(lbs): 230 Blood Pressure(mmHg): 106/65 Body Mass Index(BMI): 32.1 Temperature(F): 98.1 Respiratory Rate(breaths/min): 18 [7:Photos:] [N/A:N/A] Right, Lateral Ankle N/A N/A Wound Location: Gradually Appeared N/A N/A Wounding Event: Venous Leg Ulcer N/A N/A Primary Etiology: Anemia,  Sleep Apnea, Congestive N/A N/A Comorbid History: Heart Failure, Coronary Artery Disease, Peripheral Venous Disease, Osteoarthritis 01/11/2022 N/A N/A Date Acquired: 10 N/A N/A Weeks of Treatment: Open N/A N/A Wound Status: No N/A N/A Wound Recurrence: 0x0x0 N/A N/A Measurements L x W x D (cm) 0 N/A N/A A (cm) : rea 0 N/A N/A Volume (cm) : 100.00% N/A N/A % Reduction in Area: 100.00% N/A N/A % Reduction in Volume: Full Thickness With Exposed Support N/A N/A Classification: Structures None Present N/A N/A Exudate Amount: Distinct, outline attached N/A N/A Wound Margin: None Present (0%) N/A N/A Granulation Amount: None Present (0%) N/A N/A Necrotic Amount: Fascia: No N/A N/A Exposed Structures: Fat Layer (Subcutaneous Tissue): No Tendon: No Muscle: No Joint: No Bone: No Large (67-100%) N/A N/A Epithelialization: Excoriation: No N/A N/A Periwound Skin Texture: Induration: No Callus: No Crepitus: No Rash: No VERLE, WHEELING ([id number]) [phone].pdf Page 5 of 8 Scarring: No Maceration: No N/A N/A Periwound Skin Moisture: Dry/Scaly: No Atrophie Blanche: No N/A N/A Periwound Skin Color: Cyanosis: No Ecchymosis: No Erythema: No Hemosiderin Staining: No Mottled: No Pallor: No Rubor: No No Abnormality N/A N/A Temperature: Treatment Notes Wound #7  (Ankle) Wound Laterality: Right, Lateral Cleanser Peri-Wound Care Topical Primary Dressing Secondary Dressing Secured With Compression Wrap Compression Stockings Add-Ons Electronic Signature(s) Signed: 04/14/2022 9:50:58 AM By: Fredirick Maudlin MD FACS Entered By: Fredirick Maudlin on 04/14/2022 09:50:58 -------------------------------------------------------------------------------- Multi-Disciplinary Care Plan Details Patient Name: Date of Service: Marvin Atlas, [place] J. 04/14/2022 9:15 A M Medical Record Number: [id number] Patient Account Number: [id number] Date of Birth/Sex: Treating RN: 02-23-1961 (63 y.o. [name] Primary Care Maisee Vollman: Evonnie Dawes, Jenny Reichmann Other Clinician: Referring Alecxis Baltzell: Treating Garrit Marrow/Extender: Evette Georges in Treatment: 10 Multidisciplinary Care Plan reviewed with physician Active Inactive Electronic Signature(s) Signed: 04/14/2022 3:22:54 PM By: Sabas Sous By: Adline Peals on 04/14/2022 09:52:32 -------------------------------------------------------------------------------- Pain Assessment Details Patient Name: Date of Service: Marvin Atlas, PA UL J. 04/14/2022 9:15 A M Medical Record Number: [id number] Patient Account Number: [id number] ROBEN, SCHLIEP ([id number]) [id number] Page 6 of 8 Date of Birth/Sex: Treating RN: 21-Oct-1960 (62 y.o. [name] Primary Care Masami Plata: Other Clinician: Evonnie Dawes, Jenny Reichmann Referring Jamil Castillo: Treating Louvenia Golomb/Extender: Evette Georges in Treatment: 10 Active Problems Location of Pain Severity and Description of Pain Patient Has Paino No Site Locations Rate the pain. Current Pain Level: 0 Pain Management and Medication Current Pain Management: Electronic Signature(s) Signed: 04/14/2022 3:22:54 PM By: Adline Peals Entered By: Adline Peals on 04/14/2022  09:36:59 -------------------------------------------------------------------------------- Patient/Caregiver Education Details Patient Name: Date of Service: Marvin Atlas, PA UL J. 1/2/2024andnbsp9:15 A M Medical Record Number: [id number] Patient Account Number: [id number] Date of Birth/Gender: Treating RN: 29-Jan-1961 (62 y.o. [name] Primary Care Physician: Evonnie Dawes, Jenny Reichmann Other Clinician: Referring Physician: Treating Physician/Extender: Evette Georges in Treatment: 10 Education Assessment Education Provided To: Patient Education Topics Provided Venous: Methods: Explain/Verbal Responses: Reinforcements needed, State content correctly Electronic Signature(s) Signed: 04/14/2022 3:22:54 PM By: Adline Peals Entered By: Adline Peals on 04/14/2022 09:42:52 Adah Salvage ([id number]) [id number] Page 7 of 8 -------------------------------------------------------------------------------- Wound Assessment Details Patient Name: Date of Service: [name], [place] 04/14/2022 9:15 A M Medical Record Number: [id number] Patient Account Number: [id number] Date of Birth/Sex: Treating RN: Dec 26, 1960 (62 y.o. [name] Primary Care Zora Glendenning: Evonnie Dawes, Jenny Reichmann Other Clinician: Referring Esli Clements: Treating Rutherford Alarie/Extender: Evette Georges in Treatment: 10 Wound Status Wound Number: 8  Primary Venous Leg Ulcer Etiology: Wound Location: Right, Lateral Ankle Wound Open Wounding Event: Gradually Appeared Status: Date Acquired: 01/11/2022 Comorbid Anemia, Sleep Apnea, Congestive Heart Failure, Coronary Artery Weeks Of Treatment: 10 History: Disease, Peripheral Venous Disease, Osteoarthritis Clustered Wound: No Photos Wound Measurements Length: (cm) Width: (cm) Depth: (cm) Area: (cm) Volume: (cm) 0 % Reduction in Area: 100% 0 % Reduction in Volume: 100% 0 Epithelialization: Large  (67-100%) 0 Tunneling: No 0 Undermining: No Wound Description Classification: Full Thickness With Exposed Support Structures Wound Margin: Distinct, outline attached Exudate Amount: None Present Foul Odor After Cleansing: No Slough/Fibrino No Wound Bed Granulation Amount: None Present (0%) Exposed Structure Necrotic Amount: None Present (0%) Fascia Exposed: No Fat Layer (Subcutaneous Tissue) Exposed: No Tendon Exposed: No Muscle Exposed: No Joint Exposed: No Bone Exposed: No Periwound Skin Texture Texture Color No Abnormalities Noted: Yes No Abnormalities Noted: Yes Moisture Temperature / Pain No Abnormalities Noted: Yes Temperature: No Abnormality Electronic Signature(s) Signed: 04/14/2022 3:22:54 PM By: Adline Peals Entered By: Adline Peals on 04/14/2022 09:46:38 Adah Salvage ([id number]) [id number] Page 8 of 8 -------------------------------------------------------------------------------- Vitals Details Patient Name: Date of Service: [name], [place] J. 04/14/2022 9:15 A M Medical Record Number: [id number] Patient Account Number: [id number] Date of Birth/Sex: Treating RN: [birth year] (62 y.o. [name] Primary Care Haldon Carley: Evonnie Dawes, Jenny Reichmann Other Clinician: Referring Irelynn Schermerhorn: Treating Karna Abed/Extender: Evette Georges in Treatment: 10 Vital Signs Time Taken: 09:36 Temperature (F): 98.1 Height (in): 71 Pulse (bpm): 65 Weight (lbs): 230 Respiratory Rate (breaths/min): 18 Body Mass Index (BMI): 32.1 Blood Pressure (mmHg): 106/65 Reference Range: 80 - 120 mg / dl Electronic Signature(s) Signed: 04/14/2022 3:22:54 PM By: Adline Peals Entered By: Adline Peals on 04/14/2022 09:36:53

## 2022-04-14 NOTE — Progress Notes (Signed)
DARIEN, [name] ([id number]) [id number] Page 1 of 9 Visit Report for 04/14/2022 Chief Complaint Document Details Patient Name: Date of Service: [name], [place] J. 04/14/2022 9:15 A M Medical Record Number: [id number] Patient Account Number: [id number] Date of Birth/Sex: Treating RN: December 03, 1960 (62 y.o. M) Primary Care Provider: Evonnie Dawes, Jenny Reichmann Other Clinician: Referring Provider: Treating Provider/Extender: Evette Georges in Treatment: 10 Information Obtained from: Patient Chief Complaint 05/20/2020; patient is here for review of wounds on his bilateral lower legs in the setting of severe chronic venous insufficiency with lymphedema 02/03/2022: returns to clinic today with new wounds on RLE Electronic Signature(s) Signed: 04/14/2022 9:51:04 AM By: Fredirick Maudlin MD FACS Entered By: Fredirick Maudlin on 04/14/2022 09:51:04 -------------------------------------------------------------------------------- HPI Details Patient Name: Date of Service: Marvin Atlas, Marvin UL J. 04/14/2022 9:15 A M Medical Record Number: [id number] Patient Account Number: [id number] Date of Birth/Sex: Treating RN: [birth year] (62 y.o. M) Primary Care Provider: Evonnie Dawes, Jenny Reichmann Other Clinician: Referring Provider: Treating Provider/Extender: Evette Georges in Treatment: 10 History of Present Illness HPI Description: ADMISSION 05/20/2020 This is a 62 year old man who is not a diabetic however he has a long history of chronic venous insufficiency with wounds on his lower extremities. He went to the wound care center in [place] for a long period of time with recurrent wounds in these areas treated with compression wraps. He comes in today with wounds on his bilateral lower legs that he says have been there about a year on the right and about 6 months on the left. He went to see Dr. Oneida Alar on 01/17/2020 who noted that he had no history of DVT but does have a  history of venous insufficiency wounds. They noted that he was considering laser ablation in October 2020 but he canceled the procedure. He had an episode of bleeding from varicose veins in 2019. He had repeated venous reflux study on 10/6 which showed no evidence of a DVT or SVT Noted that he had venous reflux in the right greater saphenous vein in the thigh and the calf on the right. Also . noted to have venous reflux in the right femoral vein right popliteal vein and right small saphenous vein. At the time in October they wanted the wounds to heal a bit he also also had a right groin wound which is apparently closed. Not sure about the follow-up with vein and vascular. He comes in today with some light compression wraps and collagen on the wound placed by Dr. Lilia Pro who is a Education officer, environmental in [place]. He has 2 areas on the left leg 1 anteriorly and a large area posteriorly and a smaller area on the right anterior lower leg. He has marked lower extremity edema which is nonpitting. Hemosiderin deposition on both lower legs extensively. There is dilated venules in his feet. Past medical history includes history of opioid abuse in remission, left nephrolithiasis, history of an ablation although I did not see this in Dr. Oneida Alar notes, congestive heart failure and anemia ABIs in our clinic were noncompressible bilaterally 05/27/2020; we admitted this patient to clinic last week. He has bilateral superficial venous insufficiency with bilateral wounds left greater than right. We put him in 4 layer compression with silver alginate. The he was previously cared for in the [place] wound care clinic when it was open. His wounds are better with compression this week and there is certainly less swelling. 2/21; bilateral superficial venous insufficiency wounds. A cluster on the left  lateral. He has very well-defined areas on the right anterior right and right lateral. We are using silver alginate and 4-layer  compression 2/28; bilateral superficial venous insufficiency wounds in the setting of significant stage III lymphedema. He has a cluster on the left which is smaller. His area on the right anterior and right lateral also look better. 3/14 superficial bilateral venous insufficiency wounds. Much better on both sides in fact the area on the right posterior is healed. He still has 2 small open areas on the right anterior and the left lateral 3/21; superficial bilateral venous insufficiency wounds. Everything is closed on the right side. Small area still remains on the left. We will transition him today [name], BUELL ([id number]) [id number] Page 2 of 9 to his right extremiteaze stocking. Still dressing the remaining wound on the left with compression 3/28; the patient's right leg remains closed in his wraparound stocking. Disappointingly today the area on the left lateral leg looks about the same. I thought this might be closed with been using silver alginate 4/4; right leg remains in his wraparound stocking.. Left lateral leg measures smaller even under illumination the wound looks good. Changed him to [facility] with additional ABD compression last week 4/11; left lateral posterior very tiny superficial wound. This should be closed by next week. Using Hydrofera Blue. 4/18; left lateral wound is totally epithelialized. He has his own external compression stocking and compression pumps. READMISSION 02/03/2022 This is a 62 year old man well-known to the wound care center. He has chronic venous insufficiency and lymphedema. He has been evaluated in the past and has documented venous reflux, but thus far, has not undergone ablation. He returns to clinic today with multiple wounds on his right lower extremity. He reports that the medial lower leg wound started when something sharp stuck into his leg about a year ago. He says he was trying to treat this at home and took  a course of cephalexin that he had leftover from a previous event. It did not improve and in the past 3 to 4 weeks, it has actually gotten worse, to the point that he is unable to use his lymphedema pumps secondary to pain. He has another ulcer on his left lateral ankle and one between his first and second toes, both present for about 3 to 4 weeks, per his report. On his right medial lower leg, there are 2 ulcers with slough accumulated. The surface underneath the slough is quite fibrous, consistent with his history of multiple lower leg ulcerations. He has a triangular wound on his right lateral ankle, similar in appearance to the others. Between his first and second toes, he has what appears to be athlete's foot, with a fungal odor and skin breakdown with eschar and slough present. 02/09/2022: On the right medial lower leg wound, there is significant slough accumulation. The right lateral ankle wound is gray and the patient reports an increase in pain in this site. The area between his first and second toes has dried out considerably. The more proximal ulcer on his right posterior leg has some slough buildup over a fibrotic surface. Edema control is markedly improved. 02/17/2022: The right medial lower leg and right lateral ankle wound still have a lot of slough accumulation, but they are less painful. The culture that I took last week grew out multiple gram-positive species. The area between his first and second toes still has a consistent appearance with athlete's foot. The more proximal lateral lower leg wound has healed. He  still has not been scheduled for his repeat venous reflux studies. 02/25/2022: He had his repeat reflux studies done but for some reason they only did the right leg. He does have evidence of reflux. In the past, bilateral studies were performed and he has significant venous reflux in both legs. His edema control is excellent this week. The areas between his toes are drying  up. The right medial lower leg wound is substantially smaller with just a little bit of eschar overlying the open area. The lateral ankle wound still has some slough and eschar accumulation and remains fairly tender. 03/11/2022: The right medial leg wound has closed. The lateral ankle wound is substantially smaller with just some slough and eschar present. It is tender, but less so than on previous visits. He is still having some issues with moisture between his toes and athlete's foot. His wrap slipped and the area above the wrap is markedly swollen, well the areas that was still contained in the wrap has excellent edema control. 03/18/2022: The lateral ankle wound has contracted further with just slough and eschar present. He is not experiencing any pain in the location. His interdigital tinea pedis has responded well to over-the-counter antifungal spray powder. We had him in a zinc-based Coflex wrap and this stayed up well; edema control is much better this week. 12/12; only a small open wound remains on the right lateral ankle. The medial ankle is closed. The interdigital is a a result of tinea and that is closed as well. Primary dressing is silver alginate under compression. The patient is not using his compression pumps that this area. He has some form of compression stocking on the left leg that he was also wearing on the right at the time this wound formed although I am not sure of the compression. He brought these on [facility] 03/31/2022: The lateral ankle wound remains open under a layer of eschar. It is small. Edema control is excellent. The interdigital areas have closed and remain dry with his use of the over-the-counter spray powder. 04/07/2022: The lateral ankle wound is down to just a couple of millimeters. Edema control is excellent. 04/14/2022: His wound is healed. Electronic Signature(s) Signed: 04/14/2022 9:51:24 AM By: Fredirick Maudlin MD FACS Entered By: Fredirick Maudlin on  04/14/2022 09:51:24 -------------------------------------------------------------------------------- Physical Exam Details Patient Name: Date of Service: Marvin Atlas, Marvin [place] J. 04/14/2022 9:15 A M Medical Record Number: [id number] Patient Account Number: [id number] Date of Birth/Sex: Treating RN: 08-11-1960 (62 y.o. M) Primary Care Provider: Evonnie Dawes, Jenny Reichmann Other Clinician: Referring Provider: Treating Provider/Extender: Evette Georges in Treatment: 10 Constitutional . . . . No acute distress. Respiratory Normal work of breathing on room air. Notes 04/14/2022: His wound is healed. [name], [name] ([id number]) [id number] Page 3 of 9 Electronic Signature(s) Signed: 04/14/2022 9:51:52 AM By: Fredirick Maudlin MD FACS Entered By: Fredirick Maudlin on 04/14/2022 09:51:52 -------------------------------------------------------------------------------- Physician Orders Details Patient Name: Date of Service: Marvin Atlas, Marvin [place] J. 04/14/2022 9:15 A M Medical Record Number: [id number] Patient Account Number: [id number] Date of Birth/Sex: Treating RN: 03-13-1961 (62 y.o. [name] Primary Care Provider: Evonnie Dawes, Jenny Reichmann Other Clinician: Referring Provider: Treating Provider/Extender: Evette Georges in Treatment: 10 Verbal / Phone Orders: No Diagnosis Coding ICD-10 Coding Code Description [phone] Chronic venous hypertension (idiopathic) with ulcer and inflammation of right lower extremity L97.312 Non-pressure chronic ulcer of right ankle with fat layer exposed L97.512 Non-pressure chronic ulcer of other part of right foot  with fat layer exposed Discharge From [facility] Discharge from [place]!!!!!!!! Anesthetic (In clinic) Topical Lidocaine 4% applied to wound bed Edema Control - Lymphedema / SCD / Other Lymphedema Pumps. Use Lymphedema pumps on leg(s) 2-3 times a day for 45-60 minutes. If  wearing any wraps or hose, do not remove them. Continue exercising as instructed. Elevate legs to the level of the heart or above for 30 minutes daily and/or when sitting for 3-4 times a day throughout the day. Avoid standing for long periods of time. Patient to wear own compression stockings every day. Exercise regularly Moisturize legs daily. Compression stocking or Garment 20-30 mm/Hg pressure to: - both legs Patient Medications llergies: No Known Allergies A Notifications Medication Indication Start End 04/14/2022 lidocaine DOSE topical 4 % cream - cream topical Electronic Signature(s) Signed: 04/14/2022 9:52:00 AM By: Fredirick Maudlin MD FACS Entered By: Fredirick Maudlin on 04/14/2022 09:52:00 -------------------------------------------------------------------------------- Problem List Details Patient Name: Date of Service: Marvin Atlas, [place] J. 04/14/2022 9:15 A M Medical Record Number: [id number] Patient Account Number: [id number] Date of Birth/Sex: Treating RN: [birth year] (62 y.o. M) Primary Care Provider: Evonnie Dawes, Jenny Reichmann Other Clinician: Referring Provider: Treating Provider/Extender: Evette Georges in Treatment: [address] ([id number]) [id number] Page 4 of 9 Active Problems ICD-10 Encounter Code Description Active Date MDM Diagnosis I87.331 Chronic venous hypertension (idiopathic) with ulcer and inflammation of right 02/03/2022 No Yes lower extremity L97.312 Non-pressure chronic ulcer of right ankle with fat layer exposed 02/03/2022 No Yes L97.512 Non-pressure chronic ulcer of other part of right foot with fat layer exposed 02/03/2022 No Yes Inactive Problems Resolved Problems ICD-10 Code Description Active Date Resolved Date L97.812 Non-pressure chronic ulcer of other part of right lower leg with fat layer exposed 02/03/2022 02/03/2022 Electronic Signature(s) Signed: 04/14/2022 9:50:53 AM By: Fredirick Maudlin MD  FACS Entered By: Fredirick Maudlin on 04/14/2022 09:50:52 -------------------------------------------------------------------------------- Progress Note Details Patient Name: Date of Service: Marvin Atlas, Marvin [place] J. 04/14/2022 9:15 A M Medical Record Number: [id number] Patient Account Number: [id number] Date of Birth/Sex: Treating RN: 07-29-1960 (62 y.o. M) Primary Care Provider: Evonnie Dawes, Jenny Reichmann Other Clinician: Referring Provider: Treating Provider/Extender: Evette Georges in Treatment: 10 Subjective Chief Complaint Information obtained from Patient 05/20/2020; patient is here for review of wounds on his bilateral lower legs in the setting of severe chronic venous insufficiency with lymphedema 02/03/2022: returns to clinic today with new wounds on RLE History of Present Illness (HPI) ADMISSION 05/20/2020 This is a 62 year old man who is not a diabetic however he has a long history of chronic venous insufficiency with wounds on his lower extremities. He went to the wound care center in [place] for a long period of time with recurrent wounds in these areas treated with compression wraps. He comes in today with wounds on his bilateral lower legs that he says have been there about a year on the right and about 6 months on the left. He went to see Dr. Oneida Alar on 01/17/2020 who noted that he had no history of DVT but does have a history of venous insufficiency wounds. They noted that he was considering laser ablation in October 2020 but he canceled the procedure. He had an episode of bleeding from varicose veins in 2019. He had repeated venous reflux study on 10/6 which showed no evidence of a DVT or SVT Noted that he had venous reflux in the right greater saphenous vein in the thigh and the calf on  the right. Also . noted to have venous reflux in the right femoral vein right popliteal vein and right small saphenous vein. At the time in October they wanted the wounds to heal a bit  he also also had a right groin wound which is apparently closed. Not sure about the follow-up with vein and vascular. He comes in today with some light compression wraps and collagen on the wound placed by Dr. Lilia Pro who is a Education officer, environmental in [place]. He has 2 areas on the left leg 1 anteriorly and a large area posteriorly and a smaller area on the right anterior lower leg. He has marked lower extremity edema which is nonpitting. Hemosiderin deposition on both lower legs extensively. There is dilated venules in his feet. Past medical history includes history of opioid abuse in remission, left nephrolithiasis, history of an ablation although I did not see this in Dr. Oneida Alar notes, congestive heart failure and anemia [name], [name] ([id number]) [id number] Page 5 of 9 ABIs in our clinic were noncompressible bilaterally 05/27/2020; we admitted this patient to clinic last week. He has bilateral superficial venous insufficiency with bilateral wounds left greater than right. We put him in 4 layer compression with silver alginate. The he was previously cared for in the [place] wound care clinic when it was open. His wounds are better with compression this week and there is certainly less swelling. 2/21; bilateral superficial venous insufficiency wounds. A cluster on the left lateral. He has very well-defined areas on the right anterior right and right lateral. We are using silver alginate and 4-layer compression 2/28; bilateral superficial venous insufficiency wounds in the setting of significant stage III lymphedema. He has a cluster on the left which is smaller. His area on the right anterior and right lateral also look better. 3/14 superficial bilateral venous insufficiency wounds. Much better on both sides in fact the area on the right posterior is healed. He still has 2 small open areas on the right anterior and the left lateral 3/21; superficial bilateral venous  insufficiency wounds. Everything is closed on the right side. Small area still remains on the left. We will transition him today to his right extremiteaze stocking. Still dressing the remaining wound on the left with compression 3/28; the patient's right leg remains closed in his wraparound stocking. Disappointingly today the area on the left lateral leg looks about the same. I thought this might be closed with been using silver alginate 4/4; right leg remains in his wraparound stocking.. Left lateral leg measures smaller even under illumination the wound looks good. Changed him to [facility] with additional ABD compression last week 4/11; left lateral posterior very tiny superficial wound. This should be closed by next week. Using Hydrofera Blue. 4/18; left lateral wound is totally epithelialized. He has his own external compression stocking and compression pumps. READMISSION 02/03/2022 This is a 62 year old man well-known to the wound care center. He has chronic venous insufficiency and lymphedema. He has been evaluated in the past and has documented venous reflux, but thus far, has not undergone ablation. He returns to clinic today with multiple wounds on his right lower extremity. He reports that the medial lower leg wound started when something sharp stuck into his leg about a year ago. He says he was trying to treat this at home and took a course of cephalexin that he had leftover from a previous event. It did not improve and in the past 3 to 4 weeks, it has actually gotten worse, to  the point that he is unable to use his lymphedema pumps secondary to pain. He has another ulcer on his left lateral ankle and one between his first and second toes, both present for about 3 to 4 weeks, per his report. On his right medial lower leg, there are 2 ulcers with slough accumulated. The surface underneath the slough is quite fibrous, consistent with his history of multiple lower leg ulcerations. He  has a triangular wound on his right lateral ankle, similar in appearance to the others. Between his first and second toes, he has what appears to be athlete's foot, with a fungal odor and skin breakdown with eschar and slough present. 02/09/2022: On the right medial lower leg wound, there is significant slough accumulation. The right lateral ankle wound is gray and the patient reports an increase in pain in this site. The area between his first and second toes has dried out considerably. The more proximal ulcer on his right posterior leg has some slough buildup over a fibrotic surface. Edema control is markedly improved. 02/17/2022: The right medial lower leg and right lateral ankle wound still have a lot of slough accumulation, but they are less painful. The culture that I took last week grew out multiple gram-positive species. The area between his first and second toes still has a consistent appearance with athlete's foot. The more proximal lateral lower leg wound has healed. He still has not been scheduled for his repeat venous reflux studies. 02/25/2022: He had his repeat reflux studies done but for some reason they only did the right leg. He does have evidence of reflux. In the past, bilateral studies were performed and he has significant venous reflux in both legs. His edema control is excellent this week. The areas between his toes are drying up. The right medial lower leg wound is substantially smaller with just a little bit of eschar overlying the open area. The lateral ankle wound still has some slough and eschar accumulation and remains fairly tender. 03/11/2022: The right medial leg wound has closed. The lateral ankle wound is substantially smaller with just some slough and eschar present. It is tender, but less so than on previous visits. He is still having some issues with moisture between his toes and athlete's foot. His wrap slipped and the area above the wrap is markedly swollen, well  the areas that was still contained in the wrap has excellent edema control. 03/18/2022: The lateral ankle wound has contracted further with just slough and eschar present. He is not experiencing any pain in the location. His interdigital tinea pedis has responded well to over-the-counter antifungal spray powder. We had him in a zinc-based Coflex wrap and this stayed up well; edema control is much better this week. 12/12; only a small open wound remains on the right lateral ankle. The medial ankle is closed. The interdigital is a a result of tinea and that is closed as well. Primary dressing is silver alginate under compression. The patient is not using his compression pumps that this area. He has some form of compression stocking on the left leg that he was also wearing on the right at the time this wound formed although I am not sure of the compression. He brought these on [facility] 03/31/2022: The lateral ankle wound remains open under a layer of eschar. It is small. Edema control is excellent. The interdigital areas have closed and remain dry with his use of the over-the-counter spray powder. 04/07/2022: The lateral ankle wound is down to  just a couple of millimeters. Edema control is excellent. 04/14/2022: His wound is healed. Patient History Information obtained from Patient. Family History Cancer, Diabetes - Father, Hypertension - Father, No family history of Heart Disease, Hereditary Spherocytosis, Kidney Disease, Lung Disease, Seizures, Stroke, Thyroid Problems, Tuberculosis. Social History Current every day smoker, Marital Status - Divorced, Alcohol Use - Never, Drug Use - Prior History, Caffeine Use - Rarely. Medical History Eyes Denies history of Cataracts, Glaucoma, Optic Neuritis Ear/Nose/Mouth/Throat Denies history of Chronic sinus problems/congestion, Middle ear problems Hematologic/Lymphatic Patient has history of Anemia Denies history of Hemophilia, Human Immunodeficiency Virus,  Lymphedema, Sickle Cell Disease Respiratory Patient has history of Sleep Apnea Denies history of Aspiration, Asthma, Chronic Obstructive Pulmonary Disease (COPD), Pneumothorax, Tuberculosis Cardiovascular Patient has history of Congestive Heart Failure, Coronary Artery Disease, Peripheral Venous Disease Denies history of Angina, Arrhythmia, Deep Vein Thrombosis, Hypertension, Hypotension, Myocardial Infarction, Peripheral Arterial Disease, Phlebitis, Vasculitis Gastrointestinal [name], [name] ([id number]) [phone].pdf Page 6 of 9 Denies history of Cirrhosis , Colitis, Crohnoos, Hepatitis A, Hepatitis B, Hepatitis C Endocrine Denies history of Type I Diabetes, Type II Diabetes Genitourinary Denies history of End Stage Renal Disease Immunological Denies history of Lupus Erythematosus, Raynaudoos, Scleroderma Integumentary (Skin) Denies history of History of Burn Musculoskeletal Patient has history of Osteoarthritis Denies history of Gout, Rheumatoid Arthritis, Osteomyelitis Neurologic Denies history of Dementia, Neuropathy, Quadriplegia, Paraplegia, Seizure Disorder Oncologic Denies history of Received Chemotherapy, Received Radiation Psychiatric Denies history of Anorexia/bulimia, Confinement Anxiety Hospitalization/Surgery History - right hip nailing 10/17/2019. - 05/17/2018 nephrolithotomy left. - 01/29/2017 right lymph noes biopsy. - cystoscopy 2017, 2018. - 2017 gastric bypass surgery. Medical A Surgical History Notes nd Gastrointestinal gastric bypass Objective Constitutional No acute distress. Vitals Time Taken: 9:36 AM, Height: 71 in, Weight: 230 lbs, BMI: 32.1, Temperature: 98.1 F, Pulse: 65 bpm, Respiratory Rate: 18 breaths/min, Blood Pressure: 106/65 mmHg. Respiratory Normal work of breathing on room air. General Notes: 04/14/2022: His wound is healed. Integumentary (Hair, Skin) Wound #7 status is Open. Original cause of wound was Gradually  Appeared. The date acquired was: 01/11/2022. The wound has been in treatment 10 weeks. The wound is located on the Right,Lateral Ankle. The wound measures 0cm length x 0cm width x 0cm depth; 0cm^2 area and 0cm^3 volume. There is no tunneling or undermining noted. There is a none present amount of drainage noted. The wound margin is distinct with the outline attached to the wound base. There is no granulation within the wound bed. There is no necrotic tissue within the wound bed. The periwound skin appearance had no abnormalities noted for texture. The periwound skin appearance had no abnormalities noted for moisture. The periwound skin appearance had no abnormalities noted for color. Periwound temperature was noted as No Abnormality. 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 Anesthetic: (In clinic) Topical Lidocaine 4% applied to wound bed Edema Control - Lymphedema / SCD / Other: Lymphedema Pumps. Use Lymphedema pumps on leg(s) 2-3 times a day for 45-60 minutes. If wearing any wraps or hose, do not remove them. Continue exercising as instructed. Elevate legs to the level of the heart or above for 30 minutes daily and/or when sitting for 3-4 times a day throughout the day. Avoid standing for long periods of time. Patient  to wear own compression stockings every day. [name], [name] ([id number]) [id number] Page 7 of 9 Exercise regularly Moisturize legs daily. Compression stocking or Garment 20-30 mm/Hg pressure to: - both legs The following medication(s) was prescribed: lidocaine topical 4 % cream cream topical was prescribed at facility 04/14/2022:  His wound is healed. He did not bring his compression stockings to clinic with him today. I urged him to apply them as soon as he gets home from his visit today. He is going to undergo knee surgery in the coming weeks, after which he said that he will pursue the saphenous vein ablations that I have recommended. He still does not have an appointment with vascular surgery for this, saying that he has deferred make an appointment due to his need to care for his father and address his knee first. At this time, we will discharge him from the wound care center. He may follow-up as needed. Electronic Signature(s) Signed: 04/14/2022 9:52:58 AM By: Fredirick Maudlin MD FACS Entered By: Fredirick Maudlin on 04/14/2022 09:52:57 -------------------------------------------------------------------------------- HxROS Details Patient Name: Date of Service: Marvin Atlas, Marvin UL J. 04/14/2022 9:15 A M Medical Record Number: [id number] Patient Account Number: [id number] Date of Birth/Sex: Treating RN: 04/28/1960 (62 y.o. M) Primary Care Provider: Evonnie Dawes, Jenny Reichmann Other Clinician: Referring Provider: Treating Provider/Extender: Evette Georges in Treatment: 10 Information Obtained From Patient Eyes Medical History: Negative for: Cataracts; Glaucoma; Optic Neuritis Ear/Nose/Mouth/Throat Medical History: Negative for: Chronic sinus problems/congestion; Middle ear problems Hematologic/Lymphatic Medical History: Positive for: Anemia Negative for: Hemophilia; Human Immunodeficiency Virus; Lymphedema; Sickle Cell Disease Respiratory Medical History: Positive for: Sleep Apnea Negative for: Aspiration; Asthma; Chronic Obstructive Pulmonary Disease (COPD); Pneumothorax; Tuberculosis Cardiovascular Medical History: Positive for: Congestive Heart Failure; Coronary Artery Disease; Peripheral Venous Disease Negative for: Angina; Arrhythmia; Deep Vein Thrombosis; Hypertension; Hypotension; Myocardial  Infarction; Peripheral Arterial Disease; Phlebitis; Vasculitis Gastrointestinal Medical History: Negative for: Cirrhosis ; Colitis; Crohns; Hepatitis A; Hepatitis B; Hepatitis C Past Medical History Notes: gastric bypass Endocrine Medical HistoryMarland Kitchen [name], [name] ([id number]) [phone].pdf Page 8 of 9 Negative for: Type I Diabetes; Type II Diabetes Genitourinary Medical History: Negative for: End Stage Renal Disease Immunological Medical History: Negative for: Lupus Erythematosus; Raynauds; Scleroderma Integumentary (Skin) Medical History: Negative for: History of Burn Musculoskeletal Medical History: Positive for: Osteoarthritis Negative for: Gout; Rheumatoid Arthritis; Osteomyelitis Neurologic Medical History: Negative for: Dementia; Neuropathy; Quadriplegia; Paraplegia; Seizure Disorder Oncologic Medical History: Negative for: Received Chemotherapy; Received Radiation Psychiatric Medical History: Negative for: Anorexia/bulimia; Confinement Anxiety Immunizations Pneumococcal Vaccine: Received Pneumococcal Vaccination: Yes Received Pneumococcal Vaccination On or After 60th Birthday: Yes Implantable Devices None Hospitalization / Surgery History Type of Hospitalization/Surgery right hip nailing 10/17/2019 05/17/2018 nephrolithotomy left 01/29/2017 right lymph noes biopsy cystoscopy 2017, 2018 2017 gastric bypass surgery Family and Social History Cancer: Yes; Diabetes: Yes - Father; Heart Disease: No; Hereditary Spherocytosis: No; Hypertension: Yes - Father; Kidney Disease: No; Lung Disease: No; Seizures: No; Stroke: No; Thyroid Problems: No; Tuberculosis: No; Current every day smoker; Marital Status - Divorced; Alcohol Use: Never; Drug Use: Prior History; Caffeine Use: Rarely; Financial Concerns: No; Food, Clothing or Shelter Needs: No; Support System Lacking: No; Transportation Concerns: No Electronic Signature(s) Signed: 04/14/2022 12:20:40 PM By:  Fredirick Maudlin MD FACS Entered By: Fredirick Maudlin on 04/14/2022 09:51:30 -------------------------------------------------------------------------------- SuperBill Details Patient Name: Date of Service: Marvin Atlas, Marvin UL J. 04/14/2022 Adah Salvage ([id number]) [id number] Page 9 of 9 Medical Record Number: [id number] Patient Account Number: [id number] Date of Birth/Sex: Treating RN: 1960/04/16 (  62 y.o. [name] Primary Care Provider: Evonnie Dawes, Jenny Reichmann Other Clinician: Referring Provider: Treating Provider/Extender: Evette Georges in Treatment: 10 Diagnosis Coding ICD-10 Codes Code Description [phone] Chronic venous hypertension (idiopathic) with ulcer and inflammation of right lower extremity 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 Facility Procedures : CPT4 Code: [id number] Description: [place] VISIT-LEV 3 EST PT Modifier: Quantity: 1 Physician Procedures : CPT4 Code Description Modifier [id number] - WC PHYS LEVEL 3 - EST PT ICD-10 Diagnosis Description I87.331 Chronic venous hypertension (idiopathic) with ulcer and inflammation of right lower extremity L97.312 Non-pressure chronic ulcer of right ankle  with fat layer exposed Quantity: 1 Electronic Signature(s) Signed: 04/14/2022 9:53:10 AM By: Fredirick Maudlin MD FACS Entered By: Fredirick Maudlin on 04/14/2022 09:53:10

## 2022-04-15 ENCOUNTER — Ambulatory Visit: Payer: PPO | Attending: Cardiology

## 2022-04-15 DIAGNOSIS — Z01810 Encounter for preprocedural cardiovascular examination: Secondary | ICD-10-CM | POA: Diagnosis not present

## 2022-04-15 LAB — ECHOCARDIOGRAM COMPLETE
Area-P 1/2: 3.27 cm2
S' Lateral: 4.4 cm

## 2022-04-16 ENCOUNTER — Telehealth: Payer: Self-pay

## 2022-04-16 ENCOUNTER — Telehealth: Payer: Self-pay | Admitting: Physician Assistant

## 2022-04-16 NOTE — Patient Outreach (Signed)
  Care Coordination   04/16/2022 Name: [name] MRN: [id number] DOB: 02-Dec-1960   Care Coordination Outreach Attempts:  A second unsuccessful outreach was attempted today to offer the patient with information about available care coordination services as a benefit of their health plan.     Follow Up Plan:  Additional outreach attempts will be made to offer the patient care coordination information and services.   Encounter Outcome:  No Answer   Care Coordination Interventions:  No, not indicated    Tomasa Rand, RN, BSN, [facility] [employer] [phone]

## 2022-04-16 NOTE — Telephone Encounter (Signed)
I will fax notes to ortho.

## 2022-04-16 NOTE — Telephone Encounter (Signed)
error 

## 2022-04-16 NOTE — Telephone Encounter (Signed)
Results reviewed with pt as per Dr. Krasowski's note.  Pt verbalized understanding and had no additional questions. Routed to PCP  

## 2022-04-16 NOTE — Telephone Encounter (Signed)
Clearance notes are to be faxed to Dr. Ronnie Derby, orthopedic.

## 2022-04-16 NOTE — Telephone Encounter (Signed)
Results reviewed with pt as per Dr. Wendy Poet note. Sent to Pre-op frp medical clearance.

## 2022-04-16 NOTE — Telephone Encounter (Signed)
   Name: [name]  DOB: April 04, 1961  MRN: [id number]   Primary Cardiologist: [name], MD  Chart reviewed as part of pre-operative protocol coverage.   Pt underwent coronary CTA and echo for preoperative risk evaluation. Per Dr. Agustin Cree:  Patient coronary CT angio does show nonobstructive disease, echocardiogram showed preserved left ventricle ejection fraction, therefore from cardiac standpoint of view he is acceptable candidate for surgery from cardiac standpoint review at relatively low risk   I will route this recommendation to the requesting party via [place] fax function and remove from pre-op pool. Please call with questions.  Tami Lin Excell [place]yland, PA 04/16/2022, 11:19 AM

## 2022-04-20 ENCOUNTER — Telehealth: Payer: Self-pay

## 2022-04-20 DIAGNOSIS — F1111 Opioid abuse, in remission: Secondary | ICD-10-CM | POA: Diagnosis not present

## 2022-04-20 NOTE — Patient Outreach (Signed)
  Care Coordination   04/20/2022 Name: [name] MRN: [id number] DOB: 08-15-1960   Care Coordination Outreach Attempts:  A third unsuccessful outreach was attempted today to offer the patient with information about available care coordination services as a benefit of their health plan.   Follow Up Plan:  No further outreach attempts will be made at this time. We have been unable to contact the patient to offer or enroll patient in care coordination services  Encounter Outcome:  No Answer   Care Coordination Interventions:  No, not indicated    Tomasa Rand, RN, BSN, CEN [place] Coordinator [phone]

## 2022-04-21 ENCOUNTER — Encounter (HOSPITAL_BASED_OUTPATIENT_CLINIC_OR_DEPARTMENT_OTHER): Payer: PPO | Admitting: General Surgery

## 2022-05-04 DIAGNOSIS — F1111 Opioid abuse, in remission: Secondary | ICD-10-CM | POA: Diagnosis not present

## 2022-05-15 ENCOUNTER — Other Ambulatory Visit: Payer: Self-pay

## 2022-05-15 ENCOUNTER — Other Ambulatory Visit: Payer: Self-pay | Admitting: Orthopedic Surgery

## 2022-05-15 ENCOUNTER — Encounter (HOSPITAL_COMMUNITY)
Admission: RE | Admit: 2022-05-15 | Discharge: 2022-05-15 | Disposition: A | Discharge status: Home / Self Care | Payer: PPO | Source: Ambulatory Visit | Attending: Orthopedic Surgery | Admitting: Orthopedic Surgery

## 2022-05-15 ENCOUNTER — Encounter (HOSPITAL_COMMUNITY): Payer: Self-pay

## 2022-05-15 VITALS — BP 124/75 | HR 57 | Temp 97.8°F | Resp 16 | Ht 71.0 in | Wt 220.0 lb

## 2022-05-15 DIAGNOSIS — M1712 Unilateral primary osteoarthritis, left knee: Secondary | ICD-10-CM | POA: Diagnosis not present

## 2022-05-15 DIAGNOSIS — M25561 Pain in right knee: Secondary | ICD-10-CM | POA: Diagnosis not present

## 2022-05-15 DIAGNOSIS — G8929 Other chronic pain: Secondary | ICD-10-CM | POA: Diagnosis not present

## 2022-05-15 DIAGNOSIS — M25562 Pain in left knee: Secondary | ICD-10-CM | POA: Diagnosis not present

## 2022-05-15 DIAGNOSIS — Z87891 Personal history of nicotine dependence: Secondary | ICD-10-CM | POA: Diagnosis not present

## 2022-05-15 DIAGNOSIS — I509 Heart failure, unspecified: Secondary | ICD-10-CM | POA: Insufficient documentation

## 2022-05-15 DIAGNOSIS — I2510 Atherosclerotic heart disease of native coronary artery without angina pectoris: Secondary | ICD-10-CM | POA: Diagnosis not present

## 2022-05-15 DIAGNOSIS — Z951 Presence of aortocoronary bypass graft: Secondary | ICD-10-CM | POA: Diagnosis not present

## 2022-05-15 DIAGNOSIS — Z87442 Personal history of urinary calculi: Secondary | ICD-10-CM | POA: Insufficient documentation

## 2022-05-15 DIAGNOSIS — Z01812 Encounter for preprocedural laboratory examination: Secondary | ICD-10-CM | POA: Insufficient documentation

## 2022-05-15 DIAGNOSIS — Z9884 Bariatric surgery status: Secondary | ICD-10-CM | POA: Diagnosis not present

## 2022-05-15 DIAGNOSIS — Z01818 Encounter for other preprocedural examination: Secondary | ICD-10-CM

## 2022-05-15 LAB — CBC WITH DIFFERENTIAL/PLATELET
Abs Immature Granulocytes: 0.01 10*3/uL (ref 0.00–0.07)
Basophils Absolute: 0.1 10*3/uL (ref 0.0–0.1)
Basophils Relative: 1 %
Eosinophils Absolute: 0.1 10*3/uL (ref 0.0–0.5)
Eosinophils Relative: 2 %
HCT: 33.4 % — ABNORMAL LOW (ref 39.0–52.0)
Hemoglobin: 11.1 g/dL — ABNORMAL LOW (ref 13.0–17.0)
Immature Granulocytes: 0 %
Lymphocytes Relative: 34 %
Lymphs Abs: 1.6 10*3/uL (ref 0.7–4.0)
MCH: 29.9 pg (ref 26.0–34.0)
MCHC: 33.2 g/dL (ref 30.0–36.0)
MCV: 90 fL (ref 80.0–100.0)
Monocytes Absolute: 0.5 10*3/uL (ref 0.1–1.0)
Monocytes Relative: 10 %
Neutro Abs: 2.4 10*3/uL (ref 1.7–7.7)
Neutrophils Relative %: 53 %
Platelets: 233 10*3/uL (ref 150–400)
RBC: 3.71 MIL/uL — ABNORMAL LOW (ref 4.22–5.81)
RDW: 14.3 % (ref 11.5–15.5)
WBC: 4.7 10*3/uL (ref 4.0–10.5)
nRBC: 0 % (ref 0.0–0.2)

## 2022-05-15 LAB — COMPREHENSIVE METABOLIC PANEL
ALT: 10 U/L (ref 0–44)
AST: 19 U/L (ref 15–41)
Albumin: 3.7 g/dL (ref 3.5–5.0)
Alkaline Phosphatase: 76 U/L (ref 38–126)
Anion gap: 6 (ref 5–15)
BUN: 9 mg/dL (ref 6–20)
CO2: 27 mmol/L (ref 22–32)
Calcium: 8.3 mg/dL — ABNORMAL LOW (ref 8.9–10.3)
Chloride: 101 mmol/L (ref 98–111)
Creatinine, Ser: 0.73 mg/dL (ref 0.61–1.24)
GFR, Estimated: >60 mL/min (ref >60)
Glucose, Bld: 67 mg/dL — ABNORMAL LOW (ref 70–99)
Potassium: 3.8 mmol/L (ref 3.5–5.1)
Sodium: 134 mmol/L — ABNORMAL LOW (ref 135–145)
Total Bilirubin: 0.5 mg/dL (ref 0.3–1.2)
Total Protein: 7.2 g/dL (ref 6.5–8.1)

## 2022-05-15 LAB — SURGICAL PCR SCREEN
MRSA, PCR: POSITIVE — AB
Staphylococcus aureus: POSITIVE — AB

## 2022-05-15 NOTE — Patient Instructions (Signed)
SURGICAL WAITING ROOM VISITATION  Patients having surgery or a procedure may have no more than 2 support people in the waiting area - these visitors may rotate.    Children under the age of 78 must have an adult with them who is not the patient.  Due to an increase in RSV and influenza rates and associated hospitalizations, children ages 37 and under may not visit patients in [place].  If the patient needs to stay at the hospital during part of their recovery, the visitor guidelines for inpatient rooms apply. Pre-op nurse will coordinate an appropriate time for 1 support person to accompany patient in pre-op.  This support person may not rotate.    Please refer to the [facility] website for the visitor guidelines for Inpatients (after your surgery is over and you are in a regular room).       Your procedure is scheduled on:  05/18/22    Report to [facility] Main Entrance    Report to admitting at  [place] AM   Call this number if you have problems the morning of surgery [phone]   Do not eat food  or drink liquids  After Midnight.            If you have questions, please contact your surgeon's office.      Oral Hygiene is also important to reduce your risk of infection.                                    Remember - BRUSH YOUR TEETH THE MORNING OF SURGERY WITH YOUR REGULAR TOOTHPASTE  DENTURES WILL BE REMOVED PRIOR TO SURGERY PLEASE DO NOT APPLY "Poly grip" OR ADHESIVES!!!   Do NOT smoke after Midnight   Take these medicines the morning of surgery with A SIP OF WATER    DO NOT TAKE ANY ORAL DIABETIC MEDICATIONS DAY OF YOUR SURGERY  Bring CPAP mask and tubing day of surgery.                              You may not have any metal on your body including hair pins, jewelry, and body piercing             Do not wear make-up, lotions, powders, perfumes/cologne, or deodorant  Do not wear nail polish including gel and S&S, artificial/acrylic nails, or  any other type of covering on natural nails including finger and toenails. If you have artificial nails, gel coating, etc. that needs to be removed by a nail salon please have this removed prior to surgery or surgery may need to be canceled/ delayed if the surgeon/ anesthesia feels like they are unable to be safely monitored.   Do not shave  48 hours prior to surgery.               Men may shave face and neck.   Do not bring valuables to the hospital. [place].   Contacts, glasses, dentures or bridgework may not be worn into surgery.   Bring small overnight bag day of surgery.   DO NOT [place]. PHARMACY WILL DISPENSE MEDICATIONS LISTED ON YOUR MEDICATION LIST TO YOU DURING YOUR ADMISSION [place]!  Patients discharged on the day of surgery will not be allowed to drive home.  Someone NEEDS to stay with you for the first 24 hours after anesthesia.   Special Instructions: Bring a copy of your healthcare power of attorney and living will documents the day of surgery if you haven't scanned them before.              Please read over the following fact sheets you were given: IF [place] [phone]   If you received a COVID test during your pre-op visit  it is requested that you wear a mask when out in public, stay away from anyone that may not be feeling well and notify your surgeon if you develop symptoms. If you test positive for Covid or have been in contact with anyone that has tested positive in the last 10 days please notify you surgeon.    [place] - Preparing for Surgery Before surgery, you can play an important role.  Because skin is not sterile, your skin needs to be as free of germs as possible.  You can reduce the number of germs on your skin by washing with CHG (chlorahexidine gluconate) soap before surgery.  CHG is an antiseptic  cleaner which kills germs and bonds with the skin to continue killing germs even after washing. Please DO NOT use if you have an allergy to CHG or antibacterial soaps.  If your skin becomes reddened/irritated stop using the CHG and inform your nurse when you arrive at Short Stay. Do not shave (including legs and underarms) for at least 48 hours prior to the first CHG shower.  You may shave your face/neck. Please follow these instructions carefully:  1.  Shower with CHG Soap the night before surgery and the  morning of Surgery.  2.  If you choose to wash your hair, wash your hair first as usual with your  normal  shampoo.  3.  After you shampoo, rinse your hair and body thoroughly to remove the  shampoo.                           4.  Use CHG as you would any other liquid soap.  You can apply chg directly  to the skin and wash                       Gently with a scrungie or clean washcloth.  5.  Apply the CHG Soap to your body ONLY FROM THE NECK DOWN.   Do not use on face/ open                           Wound or open sores. Avoid contact with eyes, ears mouth and genitals (private parts).                       Wash face,  Genitals (private parts) with your normal soap.             6.  Wash thoroughly, paying special attention to the area where your surgery  will be performed.  7.  Thoroughly rinse your body with warm water from the neck down.  8.  DO NOT shower/wash with your normal soap after using and rinsing off  the CHG Soap.  9.  Pat yourself dry with a clean towel.            10.  Wear clean pajamas.            11.  Place clean sheets on your bed the night of your first shower and do not  sleep with pets. Day of Surgery : Do not apply any lotions/deodorants the morning of surgery.  Please wear clean clothes to the hospital/surgery center.  FAILURE TO FOLLOW THESE INSTRUCTIONS MAY RESULT IN THE CANCELLATION OF YOUR SURGERY PATIENT  SIGNATURE_________________________________  NURSE SIGNATURE__________________________________  ________________________________________________________________________

## 2022-05-15 NOTE — Progress Notes (Addendum)
Anesthesia Review:  PCP: DR  Lovette Cliche    Cardiologist : [name] Lov 04/10/22  Chest x-ray : EKG : 04/10/22  Echo : 04/15/22  CT Cors- 01/15/22  Stress test: Cardiac Cath :  Activity level: can do a flight of stairs without difficutly  Sleep Study/ CPAP : Fasting Blood Sugar :      / Checks Blood Sugar -- times a day:   Blood Thinner/ Instructions /Last Dose: ASA / Instructions/ Last Dose :  Wound ov note- 04/14/22 - PT states wounds are healed and he has completed treatment on bilateral legs.    PT concerned about time for surgery and arrival time on 05/18/22.  Instructed pt to call the office of Dr Ronnie Derby in regards to this.  PT voiced understanding.    Glucose 67 on preop labs.  Called pat at 1100am and LVMM for call back to make sure pt had eaten since left preop appt.   Called pt at 1104 am and pt answered phone.  Informed pt glucose was 67 at preop which is slightly low.  Asked pt if he had eaten anything since left preop appt.  PT stated he had not.  PT states he feels fine.   Instructed pt to eat something right away and he voiced understanding and stated he would.  Janett Billow [facility] aware.   BMP done 05/15/22 routed to Dr Ronnie Derby.  Called pt  and LVMM and asked him to call preop nurse.  Called pt to remind him to call pharmacy to reconcile meds.   Called pt and reached him at 1615on 05/15/22 and gave pt phone number to call pharmacy.   To reconcile med.s  PT voiced understanding.

## 2022-05-15 NOTE — Progress Notes (Signed)
Anesthesia Chart Review   Case: [id number] Date/Time: 05/18/22 0700   Procedure: TOTAL KNEE ARTHROPLASTY (Left: Knee)   Anesthesia type: Spinal   Pre-op diagnosis: Osteoarthritis of left knee M17.12   Location: [facility] ROOM 07 / [facility]   Surgeons: Vickey Huger, MD       DISCUSSION:62 y.o. former smoker with h/o CAD, CHF, left knee OA scheduled for above procedure 05/18/2022 with Dr. Vickey Huger.   Pt last seen by cardiology 04/10/2022. Per OV note, "Arthritis cardiovascular evaluation before surgery for knee replacement. Future recommendation will be made based on results on echocardiogram, if echocardiogram is fine she would be appropriate to proceed with surgery as scheduled."  Echo 04/15/22 with EF 60-65%, no valvular problems.   Anticipate pt can proceed with planned procedure barring acute status change.   VS: BP 124/75   Pulse (!) 57   Temp 36.6 C (Oral)   Resp 16   Ht '5\' 11"'$  (1.803 m)   Wt 99.8 kg   SpO2 99%   BMI 30.68 kg/m   PROVIDERS: [name], MD is PCP   Cardiologist:  [name], MD    LABS: Labs reviewed: Acceptable for surgery. (all labs ordered are listed, but only abnormal results are displayed)  Labs Reviewed  SURGICAL PCR SCREEN - Abnormal; Notable for the following components:      Result Value   MRSA, PCR POSITIVE (*)    Staphylococcus aureus POSITIVE (*)    All other components within normal limits  CBC WITH DIFFERENTIAL/PLATELET - Abnormal; Notable for the following components:   RBC 3.71 (*)    Hemoglobin 11.1 (*)    HCT 33.4 (*)    All other components within normal limits  COMPREHENSIVE METABOLIC PANEL - Abnormal; Notable for the following components:   Sodium 134 (*)    Glucose, Bld 67 (*)    Calcium 8.3 (*)    All other components within normal limits     IMAGES:   EKG:   CV: Echo 04/15/22 1. GLS -23.7%. Left ventricular ejection fraction, by estimation, is 60  to 65%. The left ventricle has normal function. The left  ventricle has no  regional wall motion abnormalities. Left ventricular diastolic parameters  were normal.   2. Right ventricular systolic function is normal. The right ventricular  size is normal. There is normal pulmonary artery systolic pressure.   3. Left atrial size was moderately dilated.   4. The mitral valve is normal in structure. No evidence of mitral valve  regurgitation. No evidence of mitral stenosis.   5. The aortic valve is normal in structure. Aortic valve regurgitation is  not visualized. No aortic stenosis is present.   6. The inferior vena cava is normal in size with greater than 50%  respiratory variability, suggesting right atrial pressure of 3 mmHg.  Past Medical History:  Diagnosis Date   Anemia    Arthritis    CAD (coronary artery disease)    CHF (congestive heart failure) ([place])    PT. DENIES AT PREOP   Heart murmur    hx of small murmur    Hepatitis    Hepatitis C not treated for Hx of   History of kidney stones    Lymphocele    Right groin   Morbid obesity (HCC)    Venous insufficiency of leg    Venous stasis dermatitis     Past Surgical History:  Procedure Laterality Date   CARPAL TUNNEL RELEASE  bilateral    CORONARY ARTERY BYPASS GRAFT     pt denies   CYSTOSCOPY W/ URETERAL STENT PLACEMENT Bilateral 04/21/2016   Procedure: CYSTOSCOPY WITH RETROGRADE PYELOGRAM/URETERAL STENT PLACEMENT;  Surgeon: [name], MD;  Location: [facility];  Service: Urology;  Laterality: Bilateral;   CYSTOSCOPY/URETEROSCOPY/HOLMIUM LASER/STENT PLACEMENT Bilateral 04/01/2016   Procedure: CYSTOSCOPY WITH RETROGRADE AND  STENT PLACEMENT;  Surgeon: [name], MD;  Location[facility];  Service: Urology;  Laterality: Bilateral;   CYSTOSCOPY/URETEROSCOPY/HOLMIUM LASER/STENT PLACEMENT Bilateral 04/21/2016   Procedure: CYSTOSCOPY/URETEROSCOPY/HOLMIUM LASER/STENT REPLACEMENT;  Surgeon: [name], MD;  Location: [facility];  Service: Urology;  Laterality:  Bilateral;   EYE SURGERY     bil cataracts   gastric bypass surgery   10/2015   GASTROPLASTY DUODENAL SWITCH  10/17/2015   HERNIA REPAIR     HIP FRACTURE SURGERY Right 10/17/2019   Using Gamma Nail performed by Dr. Donivan Scull   HOLMIUM LASER APPLICATION Bilateral 87/19/5974   Procedure: HOLMIUM LASER APPLICATION;  Surgeon: [name], MD;  Location[facility];  Service: Urology;  Laterality: Bilateral;   IR URETERAL STENT LEFT NEW ACCESS W/O SEP NEPHROSTOMY CATH  05/17/2018   LYMPH NODE BIOPSY Right 01/29/2017   Inguinal, performed by Dr. Noberto Retort   NEPHROLITHOTOMY Left 05/17/2018   Procedure: NEPHROLITHOTOMY PERCUTANEOUS;  Surgeon: [name], MD;  Location[facility];  Service: Urology;  Laterality: Left;    MEDICATIONS:  aspirin EC 81 MG tablet   atorvastatin (LIPITOR) 10 MG tablet   methadone (METHADOSE) 40 MG disintegrating tablet   No current facility-administered medications for this encounter.     Konrad Felix Ward, PA-C [facility] Pre-Surgical Testing [phone]

## 2022-05-18 ENCOUNTER — Other Ambulatory Visit: Payer: Self-pay

## 2022-05-18 ENCOUNTER — Ambulatory Visit (HOSPITAL_COMMUNITY): Payer: PPO | Admitting: Physician Assistant

## 2022-05-18 ENCOUNTER — Encounter (HOSPITAL_COMMUNITY): Payer: Self-pay | Admitting: Orthopedic Surgery

## 2022-05-18 ENCOUNTER — Ambulatory Visit (HOSPITAL_BASED_OUTPATIENT_CLINIC_OR_DEPARTMENT_OTHER): Payer: PPO | Admitting: Anesthesiology

## 2022-05-18 ENCOUNTER — Observation Stay (HOSPITAL_COMMUNITY)
Admission: RE | Admit: 2022-05-18 | Discharge: 2022-05-19 | Disposition: A | Discharge status: Home / Self Care | Payer: PPO | Attending: Orthopedic Surgery | Admitting: Orthopedic Surgery

## 2022-05-18 ENCOUNTER — Encounter (HOSPITAL_COMMUNITY)
Admission: RE | Disposition: A | Discharge status: Home / Self Care | Payer: Self-pay | Source: Home / Self Care | Attending: Orthopedic Surgery

## 2022-05-18 DIAGNOSIS — Z87891 Personal history of nicotine dependence: Secondary | ICD-10-CM

## 2022-05-18 DIAGNOSIS — I509 Heart failure, unspecified: Secondary | ICD-10-CM | POA: Diagnosis not present

## 2022-05-18 DIAGNOSIS — F1111 Opioid abuse, in remission: Secondary | ICD-10-CM | POA: Diagnosis not present

## 2022-05-18 DIAGNOSIS — M1712 Unilateral primary osteoarthritis, left knee: Secondary | ICD-10-CM | POA: Diagnosis not present

## 2022-05-18 DIAGNOSIS — Z01818 Encounter for other preprocedural examination: Secondary | ICD-10-CM

## 2022-05-18 DIAGNOSIS — J449 Chronic obstructive pulmonary disease, unspecified: Secondary | ICD-10-CM | POA: Diagnosis not present

## 2022-05-18 DIAGNOSIS — I2510 Atherosclerotic heart disease of native coronary artery without angina pectoris: Secondary | ICD-10-CM | POA: Insufficient documentation

## 2022-05-18 DIAGNOSIS — Z96659 Presence of unspecified artificial knee joint: Secondary | ICD-10-CM

## 2022-05-18 DIAGNOSIS — Z7982 Long term (current) use of aspirin: Secondary | ICD-10-CM | POA: Insufficient documentation

## 2022-05-18 DIAGNOSIS — Z951 Presence of aortocoronary bypass graft: Secondary | ICD-10-CM | POA: Diagnosis not present

## 2022-05-18 DIAGNOSIS — Z79899 Other long term (current) drug therapy: Secondary | ICD-10-CM | POA: Insufficient documentation

## 2022-05-18 DIAGNOSIS — G8918 Other acute postprocedural pain: Secondary | ICD-10-CM | POA: Diagnosis not present

## 2022-05-18 HISTORY — PX: TOTAL KNEE ARTHROPLASTY: SHX125

## 2022-05-18 SURGERY — ARTHROPLASTY, KNEE, TOTAL
Anesthesia: Spinal | Site: Knee | Laterality: Left

## 2022-05-18 MED ORDER — PROPOFOL 10 MG/ML IV BOLUS
INTRAVENOUS | Status: DC | PRN
  Administered 2022-05-18: 10 mg via INTRAVENOUS
  Administered 2022-05-18: 20 mg via INTRAVENOUS
  Administered 2022-05-18 (×2): 10 mg via INTRAVENOUS

## 2022-05-18 MED ORDER — PANTOPRAZOLE SODIUM 40 MG PO TBEC
40.0000 mg | DELAYED_RELEASE_TABLET | Freq: Every day | ORAL | Status: DC
  Administered 2022-05-18 – 2022-05-19 (×2): 40 mg via ORAL
  Filled 2022-05-18 (×2): qty 1

## 2022-05-18 MED ORDER — LACTATED RINGERS IV SOLN: INTRAVENOUS | Status: DC

## 2022-05-18 MED ORDER — HYDROMORPHONE HCL 1 MG/ML IJ SOLN
0.5000 mg | INTRAMUSCULAR | Status: DC | PRN
  Administered 2022-05-18 – 2022-05-19 (×3): 1 mg via INTRAVENOUS
  Filled 2022-05-18 (×3): qty 1

## 2022-05-18 MED ORDER — BISACODYL 5 MG PO TBEC: 5.0000 mg | DELAYED_RELEASE_TABLET | Freq: Every day | ORAL | Status: DC | PRN

## 2022-05-18 MED ORDER — ONDANSETRON HCL 4 MG/2ML IJ SOLN: 4.0000 mg | Freq: Four times a day (QID) | INTRAMUSCULAR | Status: DC | PRN

## 2022-05-18 MED ORDER — MENTHOL 3 MG MT LOZG: 1.0000 | LOZENGE | OROMUCOSAL | Status: DC | PRN

## 2022-05-18 MED ORDER — SODIUM CHLORIDE 0.9 % IV SOLN: INTRAVENOUS | Status: DC

## 2022-05-18 MED ORDER — OXYCODONE HCL 5 MG/5ML PO SOLN: 5.0000 mg | Freq: Once | ORAL | Status: DC | PRN

## 2022-05-18 MED ORDER — PHENYLEPHRINE HCL-NACL 20-0.9 MG/250ML-% IV SOLN
INTRAVENOUS | Status: AC
  Filled 2022-05-18: qty 250

## 2022-05-18 MED ORDER — PROPOFOL 1000 MG/100ML IV EMUL
INTRAVENOUS | Status: AC
  Filled 2022-05-18: qty 100

## 2022-05-18 MED ORDER — EPHEDRINE SULFATE-NACL 50-0.9 MG/10ML-% IV SOSY
PREFILLED_SYRINGE | INTRAVENOUS | Status: DC | PRN
  Administered 2022-05-18 (×4): 5 mg via INTRAVENOUS

## 2022-05-18 MED ORDER — VANCOMYCIN HCL IN DEXTROSE 1-5 GM/200ML-% IV SOLN
1000.0000 mg | INTRAVENOUS | Status: AC
  Administered 2022-05-18: 1000 mg via INTRAVENOUS
  Filled 2022-05-18: qty 200

## 2022-05-18 MED ORDER — ACETAMINOPHEN 500 MG PO TABS
1000.0000 mg | ORAL_TABLET | Freq: Four times a day (QID) | ORAL | Status: AC
  Administered 2022-05-18 – 2022-05-19 (×4): 1000 mg via ORAL
  Filled 2022-05-18 (×4): qty 2

## 2022-05-18 MED ORDER — ATORVASTATIN CALCIUM 10 MG PO TABS
10.0000 mg | ORAL_TABLET | Freq: Every day | ORAL | Status: DC
  Administered 2022-05-18 – 2022-05-19 (×2): 10 mg via ORAL
  Filled 2022-05-18 (×2): qty 1

## 2022-05-18 MED ORDER — DEXAMETHASONE SODIUM PHOSPHATE 10 MG/ML IJ SOLN
8.0000 mg | Freq: Once | INTRAMUSCULAR | Status: AC
  Administered 2022-05-18: 8 mg via INTRAVENOUS

## 2022-05-18 MED ORDER — FLEET ENEMA 7-19 GM/118ML RE ENEM: 1.0000 | ENEMA | Freq: Once | RECTAL | Status: DC | PRN

## 2022-05-18 MED ORDER — FENTANYL CITRATE (PF) 100 MCG/2ML IJ SOLN
INTRAMUSCULAR | Status: AC
  Filled 2022-05-18: qty 2

## 2022-05-18 MED ORDER — PHENOL 1.4 % MT LIQD: 1.0000 | OROMUCOSAL | Status: DC | PRN

## 2022-05-18 MED ORDER — EPHEDRINE 5 MG/ML INJ
INTRAVENOUS | Status: AC
  Filled 2022-05-18: qty 5

## 2022-05-18 MED ORDER — POVIDONE-IODINE 10 % EX SWAB
2.0000 | Freq: Once | CUTANEOUS | Status: AC
  Administered 2022-05-18: 2 via TOPICAL

## 2022-05-18 MED ORDER — 0.9 % SODIUM CHLORIDE (POUR BTL) OPTIME
TOPICAL | Status: DC | PRN
  Administered 2022-05-18: 1000 mL

## 2022-05-18 MED ORDER — WATER FOR IRRIGATION, STERILE IR SOLN
Status: DC | PRN
  Administered 2022-05-18: 2000 mL

## 2022-05-18 MED ORDER — METOCLOPRAMIDE HCL 5 MG/ML IJ SOLN: 5.0000 mg | Freq: Three times a day (TID) | INTRAMUSCULAR | Status: DC | PRN

## 2022-05-18 MED ORDER — ONDANSETRON HCL 4 MG PO TABS: 4.0000 mg | ORAL_TABLET | Freq: Four times a day (QID) | ORAL | Status: DC | PRN

## 2022-05-18 MED ORDER — MIDAZOLAM HCL 2 MG/2ML IJ SOLN
INTRAMUSCULAR | Status: AC
  Filled 2022-05-18: qty 2

## 2022-05-18 MED ORDER — ORAL CARE MOUTH RINSE: 15.0000 mL | Freq: Once | OROMUCOSAL | Status: AC

## 2022-05-18 MED ORDER — BUPIVACAINE LIPOSOME 1.3 % IJ SUSP
INTRAMUSCULAR | Status: AC
  Filled 2022-05-18: qty 20

## 2022-05-18 MED ORDER — FERROUS SULFATE 325 (65 FE) MG PO TABS
325.0000 mg | ORAL_TABLET | Freq: Three times a day (TID) | ORAL | Status: DC
  Administered 2022-05-18 – 2022-05-19 (×3): 325 mg via ORAL
  Filled 2022-05-18 (×3): qty 1

## 2022-05-18 MED ORDER — GABAPENTIN 300 MG PO CAPS
300.0000 mg | ORAL_CAPSULE | Freq: Once | ORAL | Status: AC
  Administered 2022-05-18: 300 mg via ORAL
  Filled 2022-05-18: qty 1

## 2022-05-18 MED ORDER — SODIUM CHLORIDE (PF) 0.9 % IJ SOLN
INTRAMUSCULAR | Status: AC
  Filled 2022-05-18: qty 20

## 2022-05-18 MED ORDER — PROPOFOL 500 MG/50ML IV EMUL
INTRAVENOUS | Status: DC | PRN
  Administered 2022-05-18: 50 ug/kg/min via INTRAVENOUS

## 2022-05-18 MED ORDER — DEXAMETHASONE SODIUM PHOSPHATE 10 MG/ML IJ SOLN
INTRAMUSCULAR | Status: AC
  Filled 2022-05-18: qty 1

## 2022-05-18 MED ORDER — ONDANSETRON HCL 4 MG/2ML IJ SOLN
INTRAMUSCULAR | Status: DC | PRN
  Administered 2022-05-18: 4 mg via INTRAVENOUS

## 2022-05-18 MED ORDER — ACETAMINOPHEN 500 MG PO TABS
1000.0000 mg | ORAL_TABLET | Freq: Once | ORAL | Status: AC
  Administered 2022-05-18: 1000 mg via ORAL
  Filled 2022-05-18: qty 2

## 2022-05-18 MED ORDER — OXYCODONE HCL 5 MG PO TABS
5.0000 mg | ORAL_TABLET | ORAL | Status: DC | PRN
  Administered 2022-05-18 – 2022-05-19 (×4): 10 mg via ORAL
  Filled 2022-05-18 (×4): qty 2

## 2022-05-18 MED ORDER — EPINEPHRINE PF 1 MG/ML IJ SOLN
INTRAMUSCULAR | Status: AC
  Filled 2022-05-18: qty 1

## 2022-05-18 MED ORDER — METHOCARBAMOL 500 MG PO TABS
500.0000 mg | ORAL_TABLET | Freq: Four times a day (QID) | ORAL | Status: DC | PRN
  Administered 2022-05-18 – 2022-05-19 (×3): 500 mg via ORAL
  Filled 2022-05-18 (×3): qty 1

## 2022-05-18 MED ORDER — METOCLOPRAMIDE HCL 5 MG PO TABS: 5.0000 mg | ORAL_TABLET | Freq: Three times a day (TID) | ORAL | Status: DC | PRN

## 2022-05-18 MED ORDER — METHOCARBAMOL 500 MG IVPB - SIMPLE MED
INTRAVENOUS | Status: AC
  Administered 2022-05-18: 500 mg via INTRAVENOUS
  Filled 2022-05-18: qty 55

## 2022-05-18 MED ORDER — DIPHENHYDRAMINE HCL 12.5 MG/5ML PO ELIX: 12.5000 mg | ORAL_SOLUTION | ORAL | Status: DC | PRN

## 2022-05-18 MED ORDER — ZOLPIDEM TARTRATE 5 MG PO TABS: 5.0000 mg | ORAL_TABLET | Freq: Every evening | ORAL | Status: DC | PRN

## 2022-05-18 MED ORDER — BUPIVACAINE LIPOSOME 1.3 % IJ SUSP: 20.0000 mL | Freq: Once | INTRAMUSCULAR | Status: DC

## 2022-05-18 MED ORDER — BUPIVACAINE-EPINEPHRINE 0.25% -1:200000 IJ SOLN
INTRAMUSCULAR | Status: DC | PRN
  Administered 2022-05-18: 30 mL

## 2022-05-18 MED ORDER — BUPIVACAINE IN DEXTROSE 0.75-8.25 % IT SOLN
INTRATHECAL | Status: DC | PRN
  Administered 2022-05-18: 1.8 mL via INTRATHECAL

## 2022-05-18 MED ORDER — ALUM & MAG HYDROXIDE-SIMETH 200-200-20 MG/5ML PO SUSP
30.0000 mL | ORAL | Status: DC | PRN
  Administered 2022-05-18: 30 mL via ORAL
  Filled 2022-05-18: qty 30

## 2022-05-18 MED ORDER — CHLORHEXIDINE GLUCONATE 0.12 % MT SOLN
15.0000 mL | Freq: Once | OROMUCOSAL | Status: AC
  Administered 2022-05-18: 15 mL via OROMUCOSAL

## 2022-05-18 MED ORDER — SODIUM CHLORIDE (PF) 0.9 % IJ SOLN
INTRAMUSCULAR | Status: DC | PRN
  Administered 2022-05-18: 20 mL

## 2022-05-18 MED ORDER — ASPIRIN 81 MG PO CHEW
81.0000 mg | CHEWABLE_TABLET | Freq: Two times a day (BID) | ORAL | Status: DC
  Administered 2022-05-19: 81 mg via ORAL
  Filled 2022-05-18: qty 1

## 2022-05-18 MED ORDER — HYDROMORPHONE HCL 1 MG/ML IJ SOLN
0.2500 mg | INTRAMUSCULAR | Status: DC | PRN
  Administered 2022-05-18: 0.5 mg via INTRAVENOUS

## 2022-05-18 MED ORDER — DOCUSATE SODIUM 100 MG PO CAPS
100.0000 mg | ORAL_CAPSULE | Freq: Two times a day (BID) | ORAL | Status: DC
  Administered 2022-05-18 – 2022-05-19 (×2): 100 mg via ORAL
  Filled 2022-05-18 (×2): qty 1

## 2022-05-18 MED ORDER — TRANEXAMIC ACID-NACL 1000-0.7 MG/100ML-% IV SOLN
1000.0000 mg | INTRAVENOUS | Status: AC
  Administered 2022-05-18: 1000 mg via INTRAVENOUS
  Filled 2022-05-18: qty 100

## 2022-05-18 MED ORDER — FENTANYL CITRATE (PF) 100 MCG/2ML IJ SOLN
INTRAMUSCULAR | Status: DC | PRN
  Administered 2022-05-18: 50 ug via INTRAVENOUS

## 2022-05-18 MED ORDER — BUPIVACAINE LIPOSOME 1.3 % IJ SUSP
INTRAMUSCULAR | Status: DC | PRN
  Administered 2022-05-18: 20 mL

## 2022-05-18 MED ORDER — ONDANSETRON HCL 4 MG/2ML IJ SOLN
INTRAMUSCULAR | Status: AC
  Filled 2022-05-18: qty 2

## 2022-05-18 MED ORDER — METHADONE HCL 10 MG PO TABS
195.0000 mg | ORAL_TABLET | Freq: Every day | ORAL | Status: DC
  Administered 2022-05-19: 195 mg via ORAL
  Filled 2022-05-18: qty 20

## 2022-05-18 MED ORDER — BUPIVACAINE HCL (PF) 0.25 % IJ SOLN
INTRAMUSCULAR | Status: AC
  Filled 2022-05-18: qty 30

## 2022-05-18 MED ORDER — OXYCODONE HCL 5 MG PO TABS: 5.0000 mg | ORAL_TABLET | Freq: Once | ORAL | Status: DC | PRN

## 2022-05-18 MED ORDER — ROPIVACAINE HCL 5 MG/ML IJ SOLN
INTRAMUSCULAR | Status: DC | PRN
  Administered 2022-05-18: 20 mL via PERINEURAL

## 2022-05-18 MED ORDER — DEXAMETHASONE SODIUM PHOSPHATE 10 MG/ML IJ SOLN
10.0000 mg | Freq: Once | INTRAMUSCULAR | Status: AC
  Administered 2022-05-19: 10 mg via INTRAVENOUS
  Filled 2022-05-18: qty 1

## 2022-05-18 MED ORDER — PROMETHAZINE HCL 25 MG/ML IJ SOLN: 6.2500 mg | INTRAMUSCULAR | Status: DC | PRN

## 2022-05-18 MED ORDER — METHOCARBAMOL 500 MG IVPB - SIMPLE MED: 500.0000 mg | Freq: Four times a day (QID) | INTRAVENOUS | Status: DC | PRN

## 2022-05-18 MED ORDER — HYDROMORPHONE HCL 1 MG/ML IJ SOLN
INTRAMUSCULAR | Status: AC
  Administered 2022-05-18: 0.5 mg via INTRAVENOUS
  Filled 2022-05-18: qty 1

## 2022-05-18 MED ORDER — SENNOSIDES-DOCUSATE SODIUM 8.6-50 MG PO TABS: 1.0000 | ORAL_TABLET | Freq: Every evening | ORAL | Status: DC | PRN

## 2022-05-18 MED ORDER — CEFAZOLIN SODIUM-DEXTROSE 2-4 GM/100ML-% IV SOLN
2.0000 g | INTRAVENOUS | Status: AC
  Administered 2022-05-18: 2 g via INTRAVENOUS
  Filled 2022-05-18: qty 100

## 2022-05-18 MED ORDER — SODIUM CHLORIDE 0.9 % IR SOLN
Status: DC | PRN
  Administered 2022-05-18: 1000 mL

## 2022-05-18 MED ORDER — MIDAZOLAM HCL 5 MG/5ML IJ SOLN
INTRAMUSCULAR | Status: DC | PRN
  Administered 2022-05-18 (×2): 1 mg via INTRAVENOUS

## 2022-05-18 SURGICAL SUPPLY — 57 items
ARTISURF 10M VEL 10-12 GH KNEE (Knees) IMPLANT
BAG COUNTER SPONGE SURGICOUNT (BAG) IMPLANT
BAG ZIPLOCK 12X15 (MISCELLANEOUS) ×1 IMPLANT
BLADE SAGITTAL 13X1.27X60 (BLADE) ×1 IMPLANT
BLADE SAW SGTL 18X1.27X75 (BLADE) ×1 IMPLANT
BLADE SURG 15 STRL LF DISP TIS (BLADE) ×1 IMPLANT
BLADE SURG 15 STRL SS (BLADE) ×1
BLADE SURG SZ10 CARB STEEL (BLADE) ×2 IMPLANT
BNDG ELASTIC 6X5.8 VLCR STR LF (GAUZE/BANDAGES/DRESSINGS) ×1 IMPLANT
BOWL SMART MIX CTS (DISPOSABLE) ×1 IMPLANT
CEMENT BONE R 1X40 (Cement) ×2 IMPLANT
CLSR STERI-STRIP ANTIMIC 1/2X4 (GAUZE/BANDAGES/DRESSINGS) IMPLANT
COMP FEM PERSONA STD SZ12 LT (Knees) ×1 IMPLANT
COMPONENT FEM PRNSA STD SZ12LT (Knees) IMPLANT
COVER SURGICAL LIGHT HANDLE (MISCELLANEOUS) ×1 IMPLANT
CUFF TOURN SGL QUICK 34 (TOURNIQUET CUFF) ×1
CUFF TRNQT CYL 34X4.125X (TOURNIQUET CUFF) ×1 IMPLANT
DRAPE INCISE IOBAN 66X45 STRL (DRAPES) ×2 IMPLANT
DRAPE U-SHAPE 47X51 STRL (DRAPES) ×1 IMPLANT
DRESSING AQUACEL AG SP 3.5X10 (GAUZE/BANDAGES/DRESSINGS) IMPLANT
DRSG AQUACEL AG ADV 3.5X10 (GAUZE/BANDAGES/DRESSINGS) ×1 IMPLANT
DRSG AQUACEL AG SP 3.5X10 (GAUZE/BANDAGES/DRESSINGS) ×1
DURAPREP 26ML APPLICATOR (WOUND CARE) ×2 IMPLANT
ELECT REM PT RETURN 15FT ADLT (MISCELLANEOUS) ×1 IMPLANT
GLOVE BIOGEL PI IND STRL 7.5 (GLOVE) ×1 IMPLANT
GLOVE BIOGEL PI IND STRL 8.5 (GLOVE) ×2 IMPLANT
GLOVE SURG LX STRL 7.5 STRW (GLOVE) ×1 IMPLANT
GLOVE SURG LX STRL 8.0 MICRO (GLOVE) ×2 IMPLANT
GLOVE SURG ORTHO 8.0 STRL STRW (GLOVE) ×2 IMPLANT
GOWN STRL REUS W/ TWL XL LVL3 (GOWN DISPOSABLE) ×2 IMPLANT
GOWN STRL REUS W/TWL XL LVL3 (GOWN DISPOSABLE) ×2
HANDPIECE INTERPULSE COAX TIP (DISPOSABLE) ×1
HOLDER FOLEY CATH W/STRAP (MISCELLANEOUS) ×1 IMPLANT
HOOD PEEL AWAY T7 (MISCELLANEOUS) ×3 IMPLANT
KIT TURNOVER KIT A (KITS) IMPLANT
MANIFOLD NEPTUNE II (INSTRUMENTS) ×1 IMPLANT
NDL HYPO 21X1.5 SAFETY (NEEDLE) ×1 IMPLANT
NEEDLE HYPO 21X1.5 SAFETY (NEEDLE) ×1 IMPLANT
NS IRRIG 1000ML POUR BTL (IV SOLUTION) ×1 IMPLANT
PACK TOTAL KNEE CUSTOM (KITS) ×1 IMPLANT
PROTECTOR NERVE ULNAR (MISCELLANEOUS) ×1 IMPLANT
SET HNDPC FAN SPRY TIP SCT (DISPOSABLE) ×1 IMPLANT
SPIKE FLUID TRANSFER (MISCELLANEOUS) ×2 IMPLANT
STEM POLY PAT PLY 38M KNEE (Knees) IMPLANT
STEM TIBIA 5 DEG SZ G L KNEE (Knees) IMPLANT
STRIP CLOSURE SKIN 1/2X4 (GAUZE/BANDAGES/DRESSINGS) ×1 IMPLANT
SUT BONE WAX W31G (SUTURE) ×1 IMPLANT
SUT MNCRL AB 3-0 PS2 18 (SUTURE) ×1 IMPLANT
SUT STRATAFIX 0 PDS 27 VIOLET (SUTURE) ×1
SUT STRATAFIX 1PDS 45CM VIOLET (SUTURE) ×1 IMPLANT
SUT VIC AB 1 CT1 36 (SUTURE) ×1 IMPLANT
SUTURE STRATFX 0 PDS 27 VIOLET (SUTURE) ×1 IMPLANT
SYR 30ML LL (SYRINGE) ×2 IMPLANT
TIBIA STEM 5 DEG SZ G L KNEE (Knees) ×1 IMPLANT
TRAY FOLEY MTR SLVR 16FR STAT (SET/KITS/TRAYS/PACK) ×1 IMPLANT
WATER STERILE IRR 1000ML POUR (IV SOLUTION) ×2 IMPLANT
WRAP KNEE MAXI GEL POST OP (GAUZE/BANDAGES/DRESSINGS) ×1 IMPLANT

## 2022-05-18 NOTE — H&P (Signed)
[name] MRN:  [id number] DOB/SEX:  12-Jan-1961/male  CHIEF COMPLAINT:  Painful left Knee  HISTORY: Patient is a 62 y.o. male presented with a history of pain in the left knee. Onset of symptoms was gradual starting a few years ago with gradually worsening course since that time. Patient has been treated conservatively with over-the-counter NSAIDs and activity modification. Patient currently rates pain in the knee at 10 out of 10 with activity. There is pain at night.  PAST MEDICAL HISTORY: Patient Active Problem List   Diagnosis Date Noted   Preop cardiovascular exam 12/23/2021   Bilateral primary osteoarthritis of knee 10/22/2021   Lymphedema of leg 06/15/2018   Coronary artery disease coronary CT angio showing numerous 0 to 25% stenosis, tightest stenosis obtuse marginal branch 25 to 50% 06/15/2018   Bilateral carotid bruits 08/08/2015   Smoking 08/08/2015   Venous insufficiency of leg 08/28/2014   Bacteremia 08/13/2014   Venous stasis dermatitis    Past Medical History:  Diagnosis Date   Anemia    Arthritis    CAD (coronary artery disease)    CHF (congestive heart failure) (HCC)    PT. DENIES AT PREOP   Heart murmur    hx of small murmur    Hepatitis    Hepatitis C not treated for Hx of   History of kidney stones    Lymphocele    Right groin   Morbid obesity (HCC)    Venous insufficiency of leg    Venous stasis dermatitis    Past Surgical History:  Procedure Laterality Date   CARPAL TUNNEL RELEASE     bilateral    CORONARY ARTERY BYPASS GRAFT     pt denies   CYSTOSCOPY W/ URETERAL STENT PLACEMENT Bilateral 04/21/2016   Procedure: CYSTOSCOPY WITH RETROGRADE PYELOGRAM/URETERAL STENT PLACEMENT;  Surgeon: [name], MD;  Location: [facility];  Service: Urology;  Laterality: Bilateral;   CYSTOSCOPY/URETEROSCOPY/HOLMIUM LASER/STENT PLACEMENT Bilateral 04/01/2016   Procedure: CYSTOSCOPY WITH RETROGRADE AND  STENT PLACEMENT;  Surgeon: [name],  MD;  Location: [facility];  Service: Urology;  Laterality: Bilateral;   CYSTOSCOPY/URETEROSCOPY/HOLMIUM LASER/STENT PLACEMENT Bilateral 04/21/2016   Procedure: CYSTOSCOPY/URETEROSCOPY/HOLMIUM LASER/STENT REPLACEMENT;  Surgeon: [name], MD;  Location: [facility];  Service: Urology;  Laterality: Bilateral;   EYE SURGERY     bil cataracts   gastric bypass surgery   10/2015   GASTROPLASTY DUODENAL SWITCH  10/17/2015   HERNIA REPAIR     HIP FRACTURE SURGERY Right 10/17/2019   Using Gamma Nail performed by Dr. Donivan Scull   HOLMIUM LASER APPLICATION Bilateral 41/74/0814   Procedure: HOLMIUM LASER APPLICATION;  Surgeon: [name], MD;  Location: [facility];  Service: Urology;  Laterality: Bilateral;   IR URETERAL STENT LEFT NEW ACCESS W/O SEP NEPHROSTOMY CATH  05/17/2018   LYMPH NODE BIOPSY Right 01/29/2017   Inguinal, performed by Dr. Noberto Retort   NEPHROLITHOTOMY Left 05/17/2018   Procedure: NEPHROLITHOTOMY PERCUTANEOUS;  Surgeon: [name], MD;  Location: [facility];  Service: Urology;  Laterality: Left;     MEDICATIONS:   Medications Prior to Admission  Medication Sig Dispense Refill Last Dose   aspirin EC 81 MG tablet Take 1 tablet (81 mg total) by mouth daily. Swallow whole. 90 tablet 3 05/17/2022   atorvastatin (LIPITOR) 10 MG tablet Take 1 tablet (10 mg total) by mouth daily. 90 tablet 3 05/17/2022   methadone (DOLOPHINE) 10 MG/5ML solution Take 195 mg by mouth daily.   05/18/2022 at 0430  ALLERGIES:  No Known Allergies  REVIEW OF SYSTEMS:  A comprehensive review of systems was negative except for: Musculoskeletal: positive for arthralgias and bone pain   FAMILY HISTORY:   Family History  Problem Relation Age of Onset   Leukemia Mother        passed away 07/15/2008   Diabetes Father    Heart disease Father    Hypertension Father    Colon cancer Father     SOCIAL HISTORY:   Social History   Tobacco Use   Smoking status: Former    Packs/day: 0.50    Years: 35.00    Total pack  years: 17.50    Types: Cigarettes    Quit date: 05/17/2015    Years since quitting: 7.0   Smokeless tobacco: Never  Substance Use Topics   Alcohol use: No     EXAMINATION:  Vital signs in last 24 hours: Temp:  [98 F (36.7 C)] 98 F (36.7 C) (02/05 0551) Pulse Rate:  [86] 86 (02/05 0551) Resp:  [16] 16 (02/05 0551) BP: (146)/(82) 146/82 (02/05 0551) SpO2:  [98 %] 98 % (02/05 0551) Weight:  [99.8 kg] 99.8 kg (02/05 0547)  BP (!) 146/82   Pulse 86   Temp 98 F (36.7 C) (Oral)   Resp 16   Wt 99.8 kg   SpO2 98%   BMI 30.68 kg/m   General Appearance:    Alert, cooperative, no distress, appears stated age  Head:    Normocephalic, without obvious abnormality, atraumatic  Eyes:    PERRL, conjunctiva/corneas clear, EOM's intact, fundi    benign, both eyes       Ears:    Normal TM's and external ear canals, both ears  Nose:   Nares normal, septum midline, mucosa normal, no drainage    or sinus tenderness  Throat:   Lips, mucosa, and tongue normal; teeth and gums normal  Neck:   Supple, symmetrical, trachea midline, no adenopathy;       thyroid:  No enlargement/tenderness/nodules; no carotid   bruit or JVD  Back:     Symmetric, no curvature, ROM normal, no CVA tenderness  Lungs:     Clear to auscultation bilaterally, respirations unlabored  Chest wall:    No tenderness or deformity  Heart:    Regular rate and rhythm, S1 and S2 normal, no murmur, rub   or gallop  Abdomen:     Soft, non-tender, bowel sounds active all four quadrants,    no masses, no organomegaly  Genitalia:    Normal male without lesion, discharge or tenderness  Rectal:    Normal tone, normal prostate, no masses or tenderness;   guaiac negative stool  Extremities:   Extremities normal, atraumatic, no cyanosis or edema  Pulses:   2+ and symmetric all extremities  Skin:   Skin color, texture, turgor normal, no rashes or lesions  Lymph nodes:   Cervical, supraclavicular, and axillary nodes normal  Neurologic:    CNII-XII intact. Normal strength, sensation and reflexes      throughout    Musculoskeletal:  ROM -10-100, Ligaments intact,  Imaging Review Plain radiographs demonstrate severe degenerative joint disease of the left knee. The overall alignment is neutral. The bone quality appears to be good for age and reported activity level.  Assessment/Plan: Primary osteoarthritis, left knee   The patient history, physical examination and imaging studies are consistent with advanced degenerative joint disease of the left knee. The patient has failed conservative treatment.  The clearance notes  were reviewed.  After discussion with the patient it was felt that Total Knee Replacement was indicated. The procedure,  risks, and benefits of total knee arthroplasty were presented and reviewed. The risks including but not limited to aseptic loosening, infection, blood clots, vascular injury, stiffness, patella tracking problems complications among others were discussed. The patient acknowledged the explanation, agreed to proceed with the plan.  Preoperative templating of the joint replacement has been completed, documented, and submitted to the Operating Room personnel in order to optimize intra-operative equipment management.    Patient's anticipated LOS is less than 2 midnights, meeting these requirements: - Younger than 17 - Lives within 1 hour of care - Has a competent adult at home to recover with post-op recover - NO history of  - Chronic pain requiring opiods  - Diabetes  - Coronary Artery Disease  - Heart failure  - Heart attack  - Stroke  - DVT/VTE  - Cardiac arrhythmia  - Respiratory Failure/COPD  - Renal failure  - Anemia  - Advanced Liver disease     Donia Ast 05/18/2022, 7:07 AM

## 2022-05-18 NOTE — Transfer of Care (Signed)
Immediate Anesthesia Transfer of Care Note  Patient: [name]  Procedure(s) Performed: Procedure(s): TOTAL KNEE ARTHROPLASTY (Left)  Patient Location: PACU  Anesthesia Type:Spinal, regional, mac  Level of Consciousness: Patient easily awoken, sedated, comfortable, cooperative, following commands, responds to stimulation.   Airway & Oxygen Therapy: Patient spontaneously breathing, ventilating well, oxygen via simple oxygen mask.  Post-op Assessment: Report given to PACU RN, vital signs reviewed and stable.   Post vital signs: Reviewed and stable.  Complications: No apparent anesthesia complications  Last Vitals:  Vitals Value Taken Time  BP 100/71 05/18/22 0915  Temp    Pulse 56 05/18/22 0917  Resp 11 05/18/22 0917  SpO2 100 % 05/18/22 0917  Vitals shown include unvalidated device data.  Last Pain:  Vitals:   05/18/22 0551  TempSrc: Oral  PainSc:          Complications: No notable events documented.

## 2022-05-18 NOTE — Anesthesia Procedure Notes (Signed)
Anesthesia Regional Block: Adductor canal block   Pre-Anesthetic Checklist: , timeout performed,  Correct Patient, Correct Site, Correct Laterality,  Correct Procedure, Correct Position, site marked,  Risks and benefits discussed,  Surgical consent,  Pre-op evaluation,  At surgeon's request and post-op pain management  Laterality: Left  Prep: chloraprep       Needles:  Injection technique: Single-shot  Needle Type: Stimiplex     Needle Length: 9cm  Needle Gauge: 21     Additional Needles:   Procedures:,,,, ultrasound used (permanent image in chart),,    Narrative:  Start time: 05/18/2022 6:59 AM End time: 05/18/2022 7:04 AM Injection made incrementally with aspirations every 5 mL.  Performed by: Personally  Anesthesiologist: Lynda Rainwater, MD

## 2022-05-18 NOTE — Evaluation (Signed)
Physical Therapy Evaluation Patient Details Name: [name] MRN: [id number] DOB: 1961-01-22 Today's Date: 05/18/2022  History of Present Illness  62 yo male s/p L TKA on 05/18/22. PMH: hepatits, hip fx, tobacco use, CABG, obesity, gastrostomy,  Clinical Impression  Pt is s/p TKA resulting in the deficits listed below (see PT Problem List).  Amb ~ 40' with min assist for balance and safety.  Pt is motivated, anticipate steady progress.  Pt Marvin benefit from skilled PT to increase their independence and safety with mobility to allow discharge to the venue listed below.         Recommendations for follow up therapy are one component of a multi-disciplinary discharge planning process, led by the attending physician.  Recommendations may be updated based on patient status, additional functional criteria and insurance authorization.  Follow Up Recommendations Follow physician's recommendations for discharge plan and follow up therapies      Assistance Recommended at Discharge Intermittent Supervision/Assistance  Patient can return home with the following  A little help with walking and/or transfers;Help with stairs or ramp for entrance;Assistance with cooking/housework;Assist for transportation    Equipment Recommendations    Recommendations for Other Services       Functional Status Assessment Patient has had a recent decline in their functional status and demonstrates the ability to make significant improvements in function in a reasonable and predictable amount of time.     Precautions / Restrictions Precautions Precautions: Fall;Knee Restrictions Weight Bearing Restrictions: No Other Position/Activity Restrictions: WBAT      Mobility  Bed Mobility Overal bed mobility: Needs Assistance Bed Mobility: Supine to Sit     Supine to sit: Min assist     General bed mobility comments: cues to self assist, assist to progress LLE off bed    Transfers Overall transfer  level: Needs assistance Equipment used: Rolling walker (2 wheels) Transfers: Sit to/from Stand Sit to Stand: Min assist, From elevated surface           General transfer comment: heavy use of UEs d/t decr knee flexion bil LEs, assist to bring wt up and forward over BOS    Ambulation/Gait Ambulation/Gait assistance: Min assist Gait Distance (Feet): 40 Feet Assistive device: Rolling walker (2 wheels) Gait Pattern/deviations: Step-to pattern, Decreased weight shift to left, Trunk flexed, Knee flexed in stance - left, Knee flexed in stance - right       General Gait Details: cues for RW position, heavily reliant on UEs d/t painful knees bilaterally, assist to steady however without overt LOB  Stairs            Wheelchair Mobility    Modified Rankin (Stroke Patients Only)       Balance Overall balance assessment: Needs assistance Sitting-balance support: Feet supported, No upper extremity supported Sitting balance-Leahy Scale: Good     Standing balance support: Reliant on assistive device for balance, During functional activity, Bilateral upper extremity supported Standing balance-Leahy Scale: Poor                               Pertinent Vitals/Pain Pain Assessment Pain Assessment: 0-10 Pain Score: 5  Pain Location: bil knees Pain Descriptors / Indicators: Aching, Grimacing, Operative site guarding Pain Intervention(s): Limited activity within patient's tolerance, Monitored during session, Premedicated before session, Repositioned    Home Living Family/patient expects to be discharged to:: Private residence Living Arrangements: Parent   Type of Home: Mobile home Home Access:  Ramped entrance       Home Layout: One level Home Equipment: [place] - single [employer] (2 wheels) Additional Comments: lives with dad, pt does all household tasks; does not have to give his father physical assist    Prior Function Prior Level of Function :  Independent/Modified Independent                     Hand Dominance        Extremity/Trunk Assessment   Upper Extremity Assessment Upper Extremity Assessment: Defer to OT evaluation    Lower Extremity Assessment Lower Extremity Assessment: LLE deficits/detail;RLE deficits/detail RLE Deficits / Details: knee AROM ~ 20 to 65 degrees flexion, strength grossly 3+/5 LLE Deficits / Details: ankle grossly WFL, knee flexion contracture (prior to surgery).  AAROM left knee ~ 15 to 60 degrees, able to lift LE against gravity       Communication   Communication: No difficulties  Cognition Arousal/Alertness: Awake/alert Behavior During Therapy: WFL for tasks assessed/performed Overall Cognitive Status: Within Functional Limits for tasks assessed                                          General Comments      Exercises Total Joint Exercises Ankle Circles/Pumps: AROM, Both, 10 reps Quad Sets: Both, 5 reps, AROM   Assessment/Plan    PT Assessment Patient needs continued PT services  PT Problem List Decreased strength;Decreased range of motion;Decreased mobility;Decreased balance;Decreased knowledge of precautions;Decreased activity tolerance       PT Treatment Interventions DME instruction;Therapeutic exercise;Gait training;Stair training;Functional mobility training;Therapeutic activities;Patient/family education    PT Goals (Current goals can be found in the Care Plan section)  Acute Rehab PT Goals PT Goal Formulation: With patient Time For Goal Achievement: 05/25/22 Potential to Achieve Goals: Good    Frequency 7X/week     Co-evaluation               AM-PAC PT "6 Clicks" Mobility  Outcome Measure Help needed turning from your back to your side while in a flat bed without using bedrails?: A Little Help needed moving from lying on your back to sitting on the side of a flat bed without using bedrails?: A Little Help needed moving to and from  a bed to a chair (including a wheelchair)?: A Little Help needed standing up from a chair using your arms (e.g., wheelchair or bedside chair)?: A Little Help needed to walk in hospital room?: A Little Help needed climbing 3-5 steps with a railing? : A Lot 6 Click Score: 17    End of Session Equipment Utilized During Treatment: Gait belt Activity Tolerance: Patient tolerated treatment well Patient left: in chair;with call bell/phone within reach;with chair alarm set Nurse Communication: Mobility status PT Visit Diagnosis: Other abnormalities of gait and mobility (R26.89);Difficulty in walking, not elsewhere classified (R26.2)    Time: 4825-0037 PT Time Calculation (min) (ACUTE ONLY): 21 min   Charges:   PT Evaluation $PT Eval Low Complexity: Perry Hall, PT  Acute Rehab Dept [facility]) [phone]  [facility] Weekend Pager [facility] only)  [phone]  05/18/2022   [facility] 05/18/2022, 3:43 PM

## 2022-05-18 NOTE — Progress Notes (Signed)
Orthopedic Tech Progress Note Patient Details:  [name] 1960/07/17 [id number]  CPM Left Knee CPM Left Knee: Off Left Knee Flexion (Degrees): 0 Left Knee Extension (Degrees): 70  Post Interventions Patient Tolerated: Well  Vernona Rieger 05/18/2022, 2:02 PM

## 2022-05-18 NOTE — Anesthesia Procedure Notes (Signed)
Procedure Name: MAC Date/Time: 05/18/2022 7:22 AM  Performed by: Deliah Boston, CRNAPre-anesthesia Checklist: Patient identified, Suction available, Patient being monitored and Emergency Drugs available Patient Re-evaluated:Patient Re-evaluated prior to induction Oxygen Delivery Method: Simple face mask Preoxygenation: Pre-oxygenation with 100% oxygen Placement Confirmation: positive ETCO2 and breath sounds checked- equal and bilateral

## 2022-05-18 NOTE — Op Note (Signed)
TOTAL KNEE REPLACEMENT OPERATIVE NOTE:  05/18/2022  9:19 AM  PATIENT:  [name]  62 y.o. male  PRE-OPERATIVE DIAGNOSIS:  Osteoarthritis of left knee M17.12  POST-OPERATIVE DIAGNOSIS:  Osteoarthritis of left knee M17.12  PROCEDURE:  Procedure(s): TOTAL KNEE ARTHROPLASTY  SURGEON:  Surgeon(s): Vickey Huger, MD  PHYSICIAN ASSISTANT: Carlyon Shadow, PA-C   ANESTHESIA:   spinal  SPECIMEN: None  COUNTS:  Correct  TOURNIQUET:   Total Tourniquet Time Documented: Thigh (Left) - 53 minutes Total: Thigh (Left) - 53 minutes   DICTATION:  Indication for procedure:    The patient is a 62 y.o. male who has failed conservative treatment for Osteoarthritis of left knee M17.12.  Informed consent was obtained prior to anesthesia. The risks versus benefits of the operation were explain and in a way the patient can, and did, understand.   Description of procedure:     The patient was taken to the operating room and placed under anesthesia.  The patient was positioned in the usual fashion taking care that all body parts were adequately padded and/or protected.  A tourniquet was applied and the leg prepped and draped in the usual sterile fashion.  The extremity was exsanguinated with the esmarch and tourniquet inflated to 300 mmHg.  Pre-operative range of motion was -20-90.    A midline incision approximately 6-7 inches long was made with a #10 blade.  A new blade was used to make a parapatellar arthrotomy going 2-3 cm into the quadriceps tendon, over the patella, and alongside the medial aspect of the patellar tendon.  A synovectomy was then performed with the #10 blade and forceps. I then elevated the deep MCL off the medial tibial metaphysis subperiosteally around to the semimembranosus attachment.    I everted the patella and used calipers to measure patellar thickness.  I used the reamer to ream down to appropriate thickness to recreate the native thickness.  I then removed excess  bone with the rongeur and sagittal saw.  I used the appropriately sized template and drilled the three lug holes.  I then put the trial in place and measured the thickness with the calipers to ensure recreation of the native thickness.  The trial was then removed and the patella subluxed and the knee brought into flexion.  A homan retractor was place to retract and protect the patella and lateral structures.  A Z-retractor was place medially to protect the medial structures.  The extra-medullary alignment system was used to make cut the tibial articular surface perpendicular to the anamotic axis of the tibia and in 3 degrees of posterior slope.  The cut surface and alignment jig was removed.  I then used the intramedullary alignment guide to make a valgus cut on the distal femur.  I then marked out the epicondylar axis on the distal femur.  I then used the anterior referencing sizer and measured the femur to be a size  12 .  The 4-In-1 cutting block was screwed into place in external rotation matching the posterior condylar angle, making our cuts perpendicular to the epicondylar axis.  Anterior, posterior and chamfer cuts were made with the sagittal saw.  The cutting block and cut pieces were removed.  A lamina spreader was placed in 90 degrees of flexion.  The ACL, PCL, menisci, and posterior condylar osteophytes were removed.  A 10 mm spacer blocked was found to offer good flexion and extension gap balance after minimal in degree releasing.   The scoop retractor was  then placed and the femoral finishing block was pinned in place.  The small sagittal saw was used as well as the lug drill to finish the femur.  The block and cut surfaces were removed and the medullary canal hole filled with autograft bone from the cut pieces.  The tibia was delivered forward in deep flexion and external rotation.  A size G tray was selected and pinned into place centered on the medial 1/3 of the tibial tubercle.  The reamer  and keel was used to prepare the tibia through the tray.    I then trialed with the size  12  femur, size G tibia, a 10 mm insert and the 38 patella.  I had excellent flexion/extension gap balance, excellent patella tracking.  Flexion was full and beyond 120 degrees; extension was zero.  These components were chosen and the staff opened them to me on the back table while the knee was lavaged copiously and the cement mixed.  The soft tissue was infiltrated with 60cc of exparel 1.3% through a 21 gauge needle.  I cemented in the components and removed all excess cement.  The polyethylene tibial component was snapped into place and the knee placed in extension while cement was hardening.  The capsule was infilltrated with a 60cc exparel/marcaine/saline mixture.   Once the cement was hard, the tourniquet was let down.  Hemostasis was obtained.  The arthrotomy was closed using a #1 stratofix running suture.  The deep soft tissues were closed with #0 vicryls and the subcuticular layer closed with #2-0 vicryl.  The skin was reapproximated and closed with 3.0 Monocryl.  The wound was covered with steristrips, aquacel dressing, and a TED stocking.   The patient was then awakened, extubated, and taken to the recovery room in stable condition.  BLOOD LOSS:  395VU COMPLICATIONS:  None.  PLAN OF CARE: Admit for overnight observation  PATIENT DISPOSITION:  PACU - hemodynamically stable.   Delay start of Pharmacological VTE agent (>24hrs) due to surgical blood loss or risk of bleeding:  yes  Please fax a copy of this op note to my office at [phone] (please only include page 1 and 2 of the Case Information op note)

## 2022-05-18 NOTE — Anesthesia Postprocedure Evaluation (Signed)
Anesthesia Post Note  Patient: [name]  Procedure(s) Performed: TOTAL KNEE ARTHROPLASTY (Left: Knee)     Patient location during evaluation: PACU Anesthesia Type: Spinal Level of consciousness: awake and alert Pain management: pain level controlled Vital Signs Assessment: post-procedure vital signs reviewed and stable Respiratory status: spontaneous breathing, nonlabored ventilation and respiratory function stable Cardiovascular status: blood pressure returned to baseline and stable Postop Assessment: no apparent nausea or vomiting Anesthetic complications: no   No notable events documented.  Last Vitals:  Vitals:   05/18/22 1000 05/18/22 1015  BP: 108/67 104/61  Pulse: (!) 55 (!) 48  Resp: 15 11  Temp:    SpO2: 100% 100%    Last Pain:  Vitals:   05/18/22 1015  TempSrc:   PainSc: Asleep    LLE Motor Response: Purposeful movement (05/18/22 1015)   RLE Motor Response: Purposeful movement (05/18/22 1015)   L Sensory Level: S1-Sole of foot, small toes (05/18/22 1015) R Sensory Level: S1-Sole of foot, small toes (05/18/22 1015)  Lynda Rainwater

## 2022-05-18 NOTE — Plan of Care (Signed)
  Problem: Education: Goal: Knowledge of the prescribed therapeutic regimen will improve Outcome: Progressing   Problem: Pain Management: Goal: Pain level will decrease with appropriate interventions Outcome: Progressing   Problem: Activity: Goal: Risk for activity intolerance will decrease Outcome: Progressing

## 2022-05-18 NOTE — Anesthesia Procedure Notes (Signed)
Spinal  Patient location during procedure: OR Start time: 05/18/2022 7:23 AM End time: 05/18/2022 7:28 AM Reason for block: surgical anesthesia Staffing Performed: anesthesiologist  Anesthesiologist: Lynda Rainwater, MD Performed by: Lynda Rainwater, MD Authorized by: Lynda Rainwater, MD   Preanesthetic Checklist Completed: patient identified, IV checked, site marked, risks and benefits discussed, surgical consent, monitors and equipment checked, pre-op evaluation and timeout performed Spinal Block Patient position: sitting Prep: DuraPrep Patient monitoring: heart rate, cardiac monitor, continuous pulse ox and blood pressure Approach: midline Location: L3-4 Injection technique: single-shot Needle Needle type: Quincke  Needle gauge: 22 G Needle length: 9 cm Assessment Sensory level: T4 Events: CSF return

## 2022-05-18 NOTE — Progress Notes (Signed)
Orthopedic Tech Progress Note Patient Details:  [name] September 20, 1960 [id number] Patient unable to tolerate any setting above 0-70. CPM will be removed at 1:45pm.  CPM Left Knee CPM Left Knee: On Left Knee Flexion (Degrees): 0 Left Knee Extension (Degrees): 70  Post Interventions Patient Tolerated: Well Ortho Devices Type of Ortho Device: Bone foam zero knee Ortho Device/Splint Location: Left knee Ortho Device/Splint Interventions: Application   Post Interventions Patient Tolerated: Well  [name] Lotus Santillo 05/18/2022, 9:54 AM

## 2022-05-18 NOTE — Anesthesia Preprocedure Evaluation (Signed)
Anesthesia Evaluation  Patient identified by MRN, date of birth, ID band Patient awake    Reviewed: Allergy & Precautions, NPO status , Patient's Chart, lab work & pertinent test results  History of Anesthesia Complications Negative for: history of anesthetic complications  Airway Mallampati: II  TM Distance: >3 FB Neck ROM: Full    Dental  (+) Edentulous Upper, Edentulous Lower   Pulmonary sleep apnea , COPD, Patient abstained from smoking., former smoker   Pulmonary exam normal        Cardiovascular + CAD, + Peripheral Vascular Disease and +CHF  Normal cardiovascular exam     Neuro/Psych negative neurological ROS  negative psych ROS   GI/Hepatic negative GI ROS,,,(+)     substance abuse (methadone)  , Hepatitis -, C  Endo/Other  negative endocrine ROS    Renal/GU negative Renal ROS  negative genitourinary   Musculoskeletal  (+) Arthritis , Osteoarthritis,  narcotic dependent  Abdominal   Peds  Hematology negative hematology ROS (+)   Anesthesia Other Findings 62 yo M for perc nephrostomy - PMH: current smoker, COPD HCV, chronic methadone use, morbid obesity s/p gastric bypass - followed by cardiology at [facility] for h/o LE edema, last visit in 2017 at which point he was aymptomatic; TTE showed normal EF and no valvular disease; negative stress test 2016  Reproductive/Obstetrics                             Anesthesia Physical Anesthesia Plan  ASA: III  Anesthesia Plan: Spinal   Post-op Pain Management: Regional block*   Induction: Intravenous  PONV Risk Score and Plan: 1 and Ondansetron and Treatment may vary due to age or medical condition  Airway Management Planned: Simple Face Mask  Additional Equipment: None  Intra-op Plan:   Post-operative Plan:   Informed Consent: I have reviewed the patients History and Physical, chart, labs and discussed the procedure including the  risks, benefits and alternatives for the proposed anesthesia with the patient or authorized representative who has indicated his/her understanding and acceptance.     Dental advisory given  Plan Discussed with:   Anesthesia Plan Comments:         Anesthesia Quick Evaluation

## 2022-05-19 ENCOUNTER — Encounter (HOSPITAL_COMMUNITY): Payer: Self-pay | Admitting: Orthopedic Surgery

## 2022-05-19 DIAGNOSIS — Z96652 Presence of left artificial knee joint: Secondary | ICD-10-CM | POA: Diagnosis not present

## 2022-05-19 DIAGNOSIS — M1712 Unilateral primary osteoarthritis, left knee: Secondary | ICD-10-CM | POA: Diagnosis not present

## 2022-05-19 MED ORDER — HYDROMORPHONE HCL 2 MG PO TABS: 2.0000 mg | ORAL_TABLET | Freq: Four times a day (QID) | ORAL | 0 refills | Status: AC | PRN

## 2022-05-19 MED ORDER — METHOCARBAMOL 500 MG PO TABS: 500.0000 mg | ORAL_TABLET | Freq: Four times a day (QID) | ORAL | 0 refills | Status: DC | PRN

## 2022-05-19 MED ORDER — HYDROMORPHONE HCL 2 MG PO TABS: 2.0000 mg | ORAL_TABLET | Freq: Four times a day (QID) | ORAL | 0 refills | Status: DC | PRN

## 2022-05-19 MED ORDER — ASPIRIN 81 MG PO CHEW: 81.0000 mg | CHEWABLE_TABLET | Freq: Two times a day (BID) | ORAL | 0 refills | Status: DC

## 2022-05-19 NOTE — Progress Notes (Signed)
Physical Therapy Treatment Patient Details Name: [name] MRN: [id number] DOB: September 21, 1960 Today's Date: 05/19/2022   History of Present Illness 62 yo male s/p L TKA on 05/18/22. PMH: hepatits, hip fx, tobacco use, CABG, obesity, gastrostomy,    PT Comments    Pt progressing well, will see again this afternoon. Pt is hopeful to d/c if meeting goals.     Recommendations for follow up therapy are one component of a multi-disciplinary discharge planning process, led by the attending physician.  Recommendations may be updated based on patient status, additional functional criteria and insurance authorization.  Follow Up Recommendations  Follow physician's recommendations for discharge plan and follow up therapies     Assistance Recommended at Discharge Intermittent Supervision/Assistance  Patient can return home with the following A little help with walking and/or transfers;Help with stairs or ramp for entrance;Assistance with cooking/housework;Assist for transportation   Equipment Recommendations  None recommended by PT    Recommendations for Other Services       Precautions / Restrictions Precautions Precautions: Fall;Knee Restrictions Weight Bearing Restrictions: No Other Position/Activity Restrictions: WBAT     Mobility  Bed Mobility Overal bed mobility: Needs Assistance Bed Mobility: Supine to Sit     Supine to sit: Min guard     General bed mobility comments: cues for technique, able to use gait belt to self assist    Transfers Overall transfer level: Needs assistance Equipment used: Rolling walker (2 wheels) Transfers: Sit to/from Stand Sit to Stand: Min guard, Min assist           General transfer comment: heavy use of UEs d/t decr knee flexion bil LEs, assist to bring wt up and forward over BOS    Ambulation/Gait Ambulation/Gait assistance: Min guard Gait Distance (Feet): 120 Feet Assistive device: Rolling walker (2 wheels) Gait  Pattern/deviations: Step-to pattern, Decreased weight shift to left, Trunk flexed, Knee flexed in stance - left, Knee flexed in stance - right, Step-through pattern       General Gait Details: cues for RW position, heavily reliant on UEs d/t painful knees bilaterally, assist to steady however without overt LOB   Stairs             Wheelchair Mobility    Modified Rankin (Stroke Patients Only)       Balance                                            Cognition Arousal/Alertness: Awake/alert Behavior During Therapy: WFL for tasks assessed/performed Overall Cognitive Status: Within Functional Limits for tasks assessed                                          Exercises Total Joint Exercises Ankle Circles/Pumps: AROM, Both, 10 reps Long Arc Quad: AROM, Left, 5 reps Knee Flexion: AAROM, Left, 5 reps    General Comments        Pertinent Vitals/Pain Pain Assessment Pain Assessment: 0-10 Pain Score: 4  Pain Location: bil knees Pain Descriptors / Indicators: Aching, Grimacing, Operative site guarding Pain Intervention(s): Limited activity within patient's tolerance, Monitored during session, Premedicated before session, Repositioned, Ice applied    Home Living  Prior Function            PT Goals (current goals can now be found in the care plan section) Acute Rehab PT Goals PT Goal Formulation: With patient Time For Goal Achievement: 05/25/22 Potential to Achieve Goals: Good Progress towards PT goals: Progressing toward goals    Frequency    7X/week      PT Plan Current plan remains appropriate    Co-evaluation              AM-PAC PT "6 Clicks" Mobility   Outcome Measure  Help needed turning from your back to your side while in a flat bed without using bedrails?: A Little Help needed moving from lying on your back to sitting on the side of a flat bed without using bedrails?:  A Little Help needed moving to and from a bed to a chair (including a wheelchair)?: A Little Help needed standing up from a chair using your arms (e.g., wheelchair or bedside chair)?: A Little Help needed to walk in hospital room?: A Little Help needed climbing 3-5 steps with a railing? : A Lot 6 Click Score: 17    End of Session Equipment Utilized During Treatment: Gait belt Activity Tolerance: Patient tolerated treatment well Patient left: in chair;with call bell/phone within reach;with chair alarm set Nurse Communication: Mobility status PT Visit Diagnosis: Other abnormalities of gait and mobility (R26.89);Difficulty in walking, not elsewhere classified (R26.2)     Time: 9150-4136 PT Time Calculation (min) (ACUTE ONLY): 31 min  Charges:  $Gait Training: 8-22 mins $Therapeutic Exercise: 8-22 mins                     Baxter Flattery, PT  Acute Rehab Dept [facility]) [phone]  [facility] Weekend Pager [facility] only)  [phone]  05/19/2022    [facility] 05/19/2022, 1:53 PM

## 2022-05-19 NOTE — Progress Notes (Signed)
SPORTS MEDICINE AND JOINT REPLACEMENT  [name], MD    Carlyon Shadow, PA-C [address], [place], [place]  [address]                             [phone]   PROGRESS NOTE  Subjective:  negative for Chest Pain  negative for Shortness of Breath  negative for Nausea/Vomiting   negative for Calf Pain  negative for Bowel Movement   Tolerating Diet: yes         Patient reports pain as 5 on 0-10 scale.    Objective: Vital signs in last 24 hours:   Patient Vitals for the past 24 hrs:  BP Temp Temp src Pulse Resp SpO2 Height Weight  05/19/22 0525 100/61 98 F (36.7 C) Oral (!) 59 16 92 % -- --  05/19/22 0215 -- -- -- -- -- 93 % -- --  05/19/22 0144 (!) 98/57 97.8 F (36.6 C) Oral (!) 55 16 (!) 89 % -- --  05/18/22 2028 102/65 97.7 F (36.5 C) Oral 64 18 93 % -- --  05/18/22 1759 103/66 97.8 F (36.6 C) Oral 61 17 97 % -- --  05/18/22 1536 -- -- -- -- -- -- '5\' 11"'$  (1.803 m) 99.8 kg    '@flow'$ {1959:LAST@   Intake/Output from previous day:   02/05 0701 - 02/06 0700 In: 3100.6 [P.O.:600; I.V.:2345.6] Out: 825 [Urine:800]   Intake/Output this shift:   02/06 0701 - 02/06 1900 In: 240 [P.O.:240] Out: 350 [Urine:350]   Intake/Output      02/05 0701 02/06 0700 02/06 0701 02/07 0700   P.O. 600 240   I.V. (mL/kg) 2345.6 (23.5)    Other 0    IV Piggyback 155    Total Intake(mL/kg) 3100.6 (31.1) 240 (2.4)   Urine (mL/kg/hr) 800 (0.3) 350 (0.6)   Blood 25    Total Output 825 350   Net +2275.6 -110           LABORATORY DATA: Recent Labs    05/15/22 0825  WBC 4.7  HGB 11.1*  HCT 33.4*  PLT 233   Recent Labs    05/15/22 0825  NA 134*  K 3.8  CL 101  CO2 27  BUN 9  CREATININE 0.73  GLUCOSE 67*  CALCIUM 8.3*   Lab Results  Component Value Date   INR 0.95 05/17/2018   INR 1.03 08/07/2015   INR 1.20 08/12/2014    Examination:  General appearance: alert, cooperative, and no distress Extremities: extremities normal, atraumatic, no cyanosis or  edema  Wound Exam: clean, dry, intact   Drainage:  None: wound tissue dry  Motor Exam: Quadriceps and Hamstrings Intact  Sensory Exam: Superficial Peroneal, Deep Peroneal, and Tibial normal   Assessment:    1 Day Post-Op  Procedure(s) (LRB): TOTAL KNEE ARTHROPLASTY (Left)  ADDITIONAL DIAGNOSIS:  Principal Problem:   S/P total knee replacement     Plan: Physical Therapy as ordered Weight Bearing as Tolerated (WBAT)  DVT Prophylaxis:  Aspirin  DISCHARGE PLAN: Home  Patient doing well, pain controlled. Ok to d/c home today       Patient's anticipated LOS is less than 2 midnights, meeting these requirements: - Younger than 39 - Lives within 1 hour of care - Has a competent adult at home to recover with post-op recover - NO history of  - Chronic pain requiring opiods  - Diabetes  - Coronary Artery Disease  -  Heart failure  - Heart attack  - Stroke  - DVT/VTE  - Cardiac arrhythmia  - Respiratory Failure/COPD  - Renal failure  - Anemia  - Advanced Liver disease      Donia Ast 05/19/2022, 12:49 PM

## 2022-05-19 NOTE — TOC Transition Note (Signed)
Transition of Care [facility]) - CM/SW Discharge Note   Patient Details  Name: [name] MRN: [id number] Date of Birth: Sep 19, 1960  [facility] of Care [facility] CM/SW Contact:  Lennart Pall, LCSW Phone Number: 05/19/2022, 9:52 AM   Clinical Narrative:    Met with pt and confirming he has needed DME at home.  OPPT already arranged with [place] ([place]).  No TOC needs.   Final next level of care: OP Rehab Barriers to Discharge: No Barriers Identified   Patient Goals and CMS Choice      Discharge Placement                         Discharge Plan and Services Additional resources added to the After Visit Summary for                  DME Arranged: N/A DME Agency: NA                  Social Determinants of Health (SDOH) Interventions [place]: Low Risk  (05/18/2022)  Transportation Needs: Unknown (05/18/2022)  Tobacco Use: Medium Risk (05/18/2022)     Readmission Risk Interventions     No data to display

## 2022-05-19 NOTE — Discharge Summary (Signed)
SPORTS MEDICINE & JOINT REPLACEMENT   [name], MD   Carlyon Shadow, PA-C [place], [place], [place]  [address]                             [phone]  PATIENT ID: [name]        MRN:  [id number]          DOB/AGE: [birth year] / 62 y.o.    DISCHARGE SUMMARY  ADMISSION DATE:    05/18/2022 DISCHARGE DATE:   05/19/2022   ADMISSION DIAGNOSIS: S/P total knee replacement [Z96.659]    DISCHARGE DIAGNOSIS:  Osteoarthritis of left knee M17.12    ADDITIONAL DIAGNOSIS: Principal Problem:   S/P total knee replacement  Past Medical History:  Diagnosis Date   Anemia    Arthritis    CAD (coronary artery disease)    CHF (congestive heart failure) (HCC)    PT. DENIES AT PREOP   Heart murmur    hx of small murmur    Hepatitis    Hepatitis C not treated for Hx of   History of kidney stones    Lymphocele    Right groin   Morbid obesity ([place])    Venous insufficiency of leg    Venous stasis dermatitis     PROCEDURE: Procedure(s): TOTAL KNEE ARTHROPLASTY on 05/18/2022  CONSULTS:    HISTORY:  See H&P in chart  HOSPITAL COURSE:  Trayvond Viets. is a 62 y.o. admitted on 05/18/2022 and found to have a diagnosis of Osteoarthritis of left knee M17.12.  After appropriate laboratory studies were obtained  they were taken to the operating room on 05/18/2022 and underwent Procedure(s): TOTAL KNEE ARTHROPLASTY.   They were given perioperative antibiotics:  Anti-infectives (From admission, onward)    Start     Dose/Rate Route Frequency Ordered Stop   05/18/22 0600  vancomycin (VANCOCIN) IVPB 1000 mg/200 mL premix        1,000 mg 200 mL/hr over 60 Minutes Intravenous 60 min pre-op 05/18/22 0535 05/18/22 0748   05/18/22 0600  ceFAZolin (ANCEF) IVPB 2g/100 mL premix        2 g 200 mL/hr over 30 Minutes Intravenous On call to O.R. 05/18/22 3976 05/18/22 7341     .  Patient given tranexamic acid IV or topical and exparel intra-operatively.  Tolerated the procedure  well.    POD# 1: Vital signs were stable.  Patient denied Chest pain, shortness of breath, or calf pain.  Patient was started on Aspirin twice daily at 8am.  Consults to PT, OT, and care management were made.  The patient was weight bearing as tolerated.  CPM was placed on the operative leg 0-90 degrees for 6-8 hours a day. When out of the CPM, patient was placed in the foam block to achieve full extension. Incentive spirometry was taught.  Dressing was changed.       POD #2, Continued  PT for ambulation and exercise program.  IV saline locked.  O2 discontinued.    The remainder of the hospital course was dedicated to ambulation and strengthening.   The patient was discharged on 1 Day Post-Op in  Good condition.  Blood products given:none  DIAGNOSTIC STUDIES: Recent vital signs: Patient Vitals for the past 24 hrs:  BP Temp Temp src Pulse Resp SpO2 Height Weight  05/19/22 0525 100/61 98 F (36.7 C) Oral (!) 59 16 92 % -- --  05/19/22 0215 -- -- -- -- --  93 % -- --  05/19/22 0144 (!) 98/57 97.8 F (36.6 C) Oral (!) 55 16 (!) 89 % -- --  05/18/22 2028 102/65 97.7 F (36.5 C) Oral 64 18 93 % -- --  05/18/22 1759 103/66 97.8 F (36.6 C) Oral 61 17 97 % -- --  05/18/22 1536 -- -- -- -- -- -- '5\' 11"'$  (1.803 m) 99.8 kg       Recent laboratory studies: Recent Labs    05/15/22 0825  WBC 4.7  HGB 11.1*  HCT 33.4*  PLT 233   Recent Labs    05/15/22 0825  NA 134*  K 3.8  CL 101  CO2 27  BUN 9  CREATININE 0.73  GLUCOSE 67*  CALCIUM 8.3*   Lab Results  Component Value Date   INR 0.95 05/17/2018   INR 1.03 08/07/2015   INR 1.20 08/12/2014     Recent Radiographic Studies :  No results found.  DISCHARGE INSTRUCTIONS: Discharge Instructions     Call MD / Call 911   Complete by: As directed    If you experience chest pain or shortness of breath, CALL 911 and be transported to the hospital emergency room.  If you develope a fever above 101 F, pus (white drainage) or increased  drainage or redness at the wound, or calf pain, call your surgeon's office.   Constipation Prevention   Complete by: As directed    Drink plenty of fluids.  Prune juice may be helpful.  You may use a stool softener, such as Colace (over the counter) 100 mg twice a day.  Use MiraLax (over the counter) for constipation as needed.   Diet - low sodium heart healthy   Complete by: As directed    Discharge instructions   Complete by: As directed    INSTRUCTIONS AFTER JOINT REPLACEMENT   Remove items at home which could result in a fall. This includes throw rugs or furniture in walking pathways ICE to the affected joint every three hours while awake for 30 minutes at a time, for at least the first 3-5 days, and then as needed for pain and swelling.  Continue to use ice for pain and swelling. You may notice swelling that will progress down to the foot and ankle.  This is normal after surgery.  Elevate your leg when you are not up walking on it.   Continue to use the breathing machine you got in the hospital (incentive spirometer) which will help keep your temperature down.  It is common for your temperature to cycle up and down following surgery, especially at night when you are not up moving around and exerting yourself.  The breathing machine keeps your lungs expanded and your temperature down.   DIET:  As you were doing prior to hospitalization, we recommend a well-balanced diet.  DRESSING / WOUND CARE / SHOWERING  Keep the surgical dressing until follow up.  The dressing is water proof, so you can shower without any extra covering.  IF THE DRESSING FALLS OFF or the wound gets wet inside, change the dressing with sterile gauze.  Please use good hand washing techniques before changing the dressing.  Do not use any lotions or creams on the incision until instructed by your surgeon.    ACTIVITY  Increase activity slowly as tolerated, but follow the weight bearing instructions below.   No driving for  6 weeks or until further direction given by your physician.  You cannot drive while taking narcotics.  No lifting or carrying greater than 10 lbs. until further directed by your surgeon. Avoid periods of inactivity such as sitting longer than an hour when not asleep. This helps prevent blood clots.  You may return to work once you are authorized by your doctor.     WEIGHT BEARING   Weight bearing as tolerated with assist device (walker, cane, etc) as directed, use it as long as suggested by your surgeon or therapist, typically at least 4-6 weeks.   EXERCISES  Results after joint replacement surgery are often greatly improved when you follow the exercise, range of motion and muscle strengthening exercises prescribed by your doctor. Safety measures are also important to protect the joint from further injury. Any time any of these exercises cause you to have increased pain or swelling, decrease what you are doing until you are comfortable again and then slowly increase them. If you have problems or questions, call your caregiver or physical therapist for advice.   Rehabilitation is important following a joint replacement. After just a few days of immobilization, the muscles of the leg can become weakened and shrink (atrophy).  These exercises are designed to build up the tone and strength of the thigh and leg muscles and to improve motion. Often times heat used for twenty to thirty minutes before working out will loosen up your tissues and help with improving the range of motion but do not use heat for the first two weeks following surgery (sometimes heat can increase post-operative swelling).   These exercises can be done on a training (exercise) mat, on the floor, on a table or on a bed. Use whatever works the best and is most comfortable for you.    Use music or television while you are exercising so that the exercises are a pleasant break in your day. This will make your life better with the  exercises acting as a break in your routine that you can look forward to.   Perform all exercises about fifteen times, three times per day or as directed.  You should exercise both the operative leg and the other leg as well.  Exercises include:   Quad Sets - Tighten up the muscle on the front of the thigh (Quad) and hold for 5-10 seconds.   Straight Leg Raises - With your knee straight (if you were given a brace, keep it on), lift the leg to 60 degrees, hold for 3 seconds, and slowly lower the leg.  Perform this exercise against resistance later as your leg gets stronger.  Leg Slides: Lying on your back, slowly slide your foot toward your buttocks, bending your knee up off the floor (only go as far as is comfortable). Then slowly slide your foot back down until your leg is flat on the floor again.  Angel Wings: Lying on your back spread your legs to the side as far apart as you can without causing discomfort.  Hamstring Strength:  Lying on your back, push your heel against the floor with your leg straight by tightening up the muscles of your buttocks.  Repeat, but this time bend your knee to a comfortable angle, and push your heel against the floor.  You may put a pillow under the heel to make it more comfortable if necessary.   A rehabilitation program following joint replacement surgery can speed recovery and prevent re-injury in the future due to weakened muscles. Contact your doctor or a physical therapist for more information on knee rehabilitation.  CONSTIPATION  Constipation is defined medically as fewer than three stools per week and severe constipation as less than one stool per week.  Even if you have a regular bowel pattern at home, your normal regimen is likely to be disrupted due to multiple reasons following surgery.  Combination of anesthesia, postoperative narcotics, change in appetite and fluid intake all can affect your bowels.   YOU MUST use at least one of the following  options; they are listed in order of increasing strength to get the job done.  They are all available over the counter, and you may need to use some, POSSIBLY even all of these options:    Drink plenty of fluids (prune juice may be helpful) and high fiber foods Colace 100 mg by mouth twice a day  Senokot for constipation as directed and as needed Dulcolax (bisacodyl), take with full glass of water  Miralax (polyethylene glycol) once or twice a day as needed.  If you have tried all these things and are unable to have a bowel movement in the first 3-4 days after surgery call either your surgeon or your primary doctor.    If you experience loose stools or diarrhea, hold the medications until you stool forms back up.  If your symptoms do not get better within 1 week or if they get worse, check with your doctor.  If you experience "the worst abdominal pain ever" or develop nausea or vomiting, please contact the office immediately for further recommendations for treatment.   ITCHING:  If you experience itching with your medications, try taking only a single pain pill, or even half a pain pill at a time.  You can also use Benadryl over the counter for itching or also to help with sleep.   TED HOSE STOCKINGS:  Use stockings on both legs until for at least 2 weeks or as directed by physician office. They may be removed at night for sleeping.  MEDICATIONS:  See your medication summary on the "After Visit Summary" that nursing will review with you.  You may have some home medications which will be placed on hold until you complete the course of blood thinner medication.  It is important for you to complete the blood thinner medication as prescribed.  PRECAUTIONS:  If you experience chest pain or shortness of breath - call 911 immediately for transfer to the hospital emergency department.   If you develop a fever greater that 101 F, purulent drainage from wound, increased redness or drainage from wound, foul  odor from the wound/dressing, or calf pain - CONTACT YOUR SURGEON.                                                   FOLLOW-UP APPOINTMENTS:  If you do not already have a post-op appointment, please call the office for an appointment to be seen by your surgeon.  Guidelines for how soon to be seen are listed in your "After Visit Summary", but are typically between 1-4 weeks after surgery.  OTHER INSTRUCTIONS:   Knee Replacement:  Do not place pillow under knee, focus on keeping the knee straight while resting. CPM instructions: 0-90 degrees, 2 hours in the morning, 2 hours in the afternoon, and 2 hours in the evening. Place foam block, curve side up under heel at all times except when in CPM or  when walking.  DO NOT modify, tear, cut, or change the foam block in any way.  POST-OPERATIVE OPIOID TAPER INSTRUCTIONS: It is important to wean off of your opioid medication as soon as possible. If you do not need pain medication after your surgery it is ok to stop day one. Opioids include: Codeine, Hydrocodone(Norco, Vicodin), Oxycodone(Percocet, oxycontin) and hydromorphone amongst others.  Long term and even short term use of opiods can cause: Increased pain response Dependence Constipation Depression Respiratory depression And more.  Withdrawal symptoms can include Flu like symptoms Nausea, vomiting And more Techniques to manage these symptoms Hydrate well Eat regular healthy meals Stay active Use relaxation techniques(deep breathing, meditating, yoga) Do Not substitute Alcohol to help with tapering If you have been on opioids for less than two weeks and do not have pain than it is ok to stop all together.  Plan to wean off of opioids This plan should start within one week post op of your joint replacement. Maintain the same interval or time between taking each dose and first decrease the dose.  Cut the total daily intake of opioids by one tablet each day Next start to increase the time  between doses. The last dose that should be eliminated is the evening dose.     MAKE SURE YOU:  Understand these instructions.  Get help right away if you are not doing well or get worse.    Thank you for letting us be a part of your medical care team.  It is a privilege we respect greatly.  We hope these instructions will help you stay on track for a fast and full recovery!   Increase activity slowly as tolerated   Complete by: As directed    Post-operative opioid taper instructions:   Complete by: As directed    POST-OPERATIVE OPIOID TAPER INSTRUCTIONS: It is important to wean off of your opioid medication as soon as possible. If you do not need pain medication after your surgery it is ok to stop day one. Opioids include: Codeine, Hydrocodone(Norco, Vicodin), Oxycodone(Percocet, oxycontin) and hydromorphone amongst others.  Long term and even short term use of opiods can cause: Increased pain response Dependence Constipation Depression Respiratory depression And more.  Withdrawal symptoms can include Flu like symptoms Nausea, vomiting And more Techniques to manage these symptoms Hydrate well Eat regular healthy meals Stay active Use relaxation techniques(deep breathing, meditating, yoga) Do Not substitute Alcohol to help with tapering If you have been on opioids for less than two weeks and do not have pain than it is ok to stop all together.  Plan to wean off of opioids This plan should start within one week post op of your joint replacement. Maintain the same interval or time between taking each dose and first decrease the dose.  Cut the total daily intake of opioids by one tablet each day Next start to increase the time between doses. The last dose that should be eliminated is the evening dose.          DISCHARGE MEDICATIONS:   Allergies as of 05/19/2022   No Known Allergies      Medication List     STOP taking these medications    aspirin EC 81 MG  tablet Replaced by: aspirin 81 MG chewable tablet       TAKE these medications    aspirin 81 MG chewable tablet Chew 1 tablet (81 mg total) by mouth 2 (two) times daily. Replaces: aspirin EC 81 MG tablet   atorvastatin  10 MG tablet Commonly known as: LIPITOR Take 1 tablet (10 mg total) by mouth daily.   HYDROmorphone 2 MG tablet Commonly known as: Dilaudid Take 1-2 tablets (2-4 mg total) by mouth every 6 (six) hours as needed for up to 5 days for severe pain.   methadone 10 MG/5ML solution Commonly known as: DOLOPHINE Take 195 mg by mouth daily.   methocarbamol 500 MG tablet Commonly known as: ROBAXIN Take 1-2 tablets (500-1,000 mg total) by mouth every 6 (six) hours as needed for muscle spasms.               Durable Medical Equipment  (From admission, onward)           Start     Ordered   05/18/22 1047  DME Walker rolling  Once       Question:  Patient needs a walker to treat with the following condition  Answer:  S/P total knee replacement   05/18/22 1046   05/18/22 1047  DME 3 n 1  Once        05/18/22 1046   05/18/22 1047  DME Bedside commode  Once       Question:  Patient needs a bedside commode to treat with the following condition  Answer:  S/P total knee replacement   05/18/22 1046            FOLLOW UP VISIT:    DISPOSITION: HOME VS. SNF  Dental Antibiotics:  In most cases prophylactic antibiotics for Dental procdeures after total joint surgery are not necessary.  Exceptions are as follows:  1. History of prior total joint infection  2. Severely immunocompromised (Organ Transplant, cancer chemotherapy, Rheumatoid biologic meds such as Superior)  3. Poorly controlled diabetes (A1C &gt; 8.0, blood glucose over 200)  If you have one of these conditions, contact your surgeon for an antibiotic prescription, prior to your dental procedure.   CONDITION:  Good   Donia Ast 05/19/2022, 12:53 PM

## 2022-05-19 NOTE — Progress Notes (Signed)
05/19/22 1400  PT Visit Information  Last PT Received On 05/19/22  Assistance Needed Pt continues to make good progress. Meeting goals. Has ramp at home and states his sister will be assisting him into the house. Pt feels ready to d/c home. Pt lives with his dad and is his   primary caregiver, pt states he has no concerns regarding d/c home with his father, does not have to physically assist him.   History of Present Illness 62 yo male s/p L TKA on 05/18/22. PMH: hepatits, hip fx, tobacco use, CABG, obesity, gastrostomy,  Precautions  Precautions Fall;Knee  Restrictions  Weight Bearing Restrictions No  Other Position/Activity Restrictions WBAT  Pain Assessment  Pain Assessment 0-10  Pain Score 5  Pain Location bil knees  Pain Descriptors / Indicators Aching;Grimacing;Operative site guarding  Pain Intervention(s) Limited activity within patient's tolerance;Monitored during session;Premedicated before session;Repositioned  Cognition  Arousal/Alertness Awake/alert  Behavior During Therapy WFL for tasks assessed/performed  Overall Cognitive Status Within Functional Limits for tasks assessed  Bed Mobility  General bed mobility comments in recliner  Transfers  Overall transfer level Needs assistance  Equipment used Rolling walker (2 wheels)  Transfers Sit to/from Stand  Sit to Stand Min guard;Min assist  General transfer comment heavy use of UEs d/t decr knee flexion bil LEs, assist to bring wt up and forward over BOS  Ambulation/Gait  Ambulation/Gait assistance Min guard  Gait Distance (Feet) 120 Feet  Assistive device Rolling walker (2 wheels)  Gait Pattern/deviations Step-to pattern;Decreased weight shift to left;Trunk flexed;Knee flexed in stance - left;Knee flexed in stance - right;Step-through pattern  General Gait Details gait stability improving, incr stride length incr. no LOB with RW support  Balance  Sitting balance-Leahy Scale Good  Standing balance support Reliant on  assistive device for balance;During functional activity;Bilateral upper extremity supported  Standing balance-Leahy Scale Poor  Total Joint Exercises  Ankle Circles/Pumps AROM;Both;10 reps  Heel Slides AAROM;Left;10 reps  Hip ABduction/ADduction AROM;Left;10 reps  Straight Leg Raises AROM;AAROM;Left;10 reps  PT - End of Session  Equipment Utilized During Treatment Gait belt  Activity Tolerance Patient tolerated treatment well  Patient left in chair;with call bell/phone within reach;with chair alarm set  Nurse Communication Mobility status   PT - Assessment/Plan  PT Plan Current plan remains appropriate  PT Visit Diagnosis Other abnormalities of gait and mobility (R26.89);Difficulty in walking, not elsewhere classified (R26.2)  PT Frequency (ACUTE ONLY) 7X/week  Follow Up Recommendations Follow physician's recommendations for discharge plan and follow up therapies  Assistance recommended at discharge Intermittent Supervision/Assistance  Patient can return home with the following A little help with walking and/or transfers;Help with stairs or ramp for entrance;Assistance with cooking/housework;Assist for transportation  PT equipment None recommended by PT  AM-PAC PT "6 Clicks" Mobility Outcome Measure (Version 2)  Help needed turning from your back to your side while in a flat bed without using bedrails? 3  Help needed moving from lying on your back to sitting on the side of a flat bed without using bedrails? 3  Help needed moving to and from a bed to a chair (including a wheelchair)? 3  Help needed standing up from a chair using your arms (e.g., wheelchair or bedside chair)? 3  Help needed to walk in hospital room? 3  Help needed climbing 3-5 steps with a railing?  2  6 Click Score 17  Consider Recommendation of Discharge To: [facility] with [facility]  PT Goal Progression  Progress towards PT goals Progressing toward goals  Acute Rehab PT Goals  PT Goal Formulation With patient  Time For Goal  Achievement 05/25/22  Potential to Achieve Goals Good  PT Time Calculation  PT Start Time (ACUTE ONLY) 1417  PT Stop Time (ACUTE ONLY) 1438  PT Time Calculation (min) (ACUTE ONLY) 21 min  PT General Charges  $$ ACUTE PT VISIT 1 Visit  PT Treatments  $Gait Training 8-22 mins

## 2022-05-19 NOTE — Progress Notes (Signed)
Aquacel dressing applied to left knee. ABD applied in case of bleeding from site again as noted from night shift report.

## 2022-05-28 DIAGNOSIS — M6281 Muscle weakness (generalized): Secondary | ICD-10-CM | POA: Diagnosis not present

## 2022-05-28 DIAGNOSIS — Z471 Aftercare following joint replacement surgery: Secondary | ICD-10-CM | POA: Diagnosis not present

## 2022-05-28 DIAGNOSIS — Z96652 Presence of left artificial knee joint: Secondary | ICD-10-CM | POA: Diagnosis not present

## 2022-05-28 DIAGNOSIS — M25562 Pain in left knee: Secondary | ICD-10-CM | POA: Diagnosis not present

## 2022-06-01 DIAGNOSIS — F1111 Opioid abuse, in remission: Secondary | ICD-10-CM | POA: Diagnosis not present

## 2022-06-02 DIAGNOSIS — M6281 Muscle weakness (generalized): Secondary | ICD-10-CM | POA: Diagnosis not present

## 2022-06-02 DIAGNOSIS — M25562 Pain in left knee: Secondary | ICD-10-CM | POA: Diagnosis not present

## 2022-06-04 DIAGNOSIS — M6281 Muscle weakness (generalized): Secondary | ICD-10-CM | POA: Diagnosis not present

## 2022-06-04 DIAGNOSIS — M25562 Pain in left knee: Secondary | ICD-10-CM | POA: Diagnosis not present

## 2022-06-08 DIAGNOSIS — M25562 Pain in left knee: Secondary | ICD-10-CM | POA: Diagnosis not present

## 2022-06-08 DIAGNOSIS — M6281 Muscle weakness (generalized): Secondary | ICD-10-CM | POA: Diagnosis not present

## 2022-06-11 DIAGNOSIS — M25562 Pain in left knee: Secondary | ICD-10-CM | POA: Diagnosis not present

## 2022-06-11 DIAGNOSIS — M6281 Muscle weakness (generalized): Secondary | ICD-10-CM | POA: Diagnosis not present

## 2022-06-15 DIAGNOSIS — F1111 Opioid abuse, in remission: Secondary | ICD-10-CM | POA: Diagnosis not present

## 2022-06-16 DIAGNOSIS — M6281 Muscle weakness (generalized): Secondary | ICD-10-CM | POA: Diagnosis not present

## 2022-06-16 DIAGNOSIS — M25562 Pain in left knee: Secondary | ICD-10-CM | POA: Diagnosis not present

## 2022-06-18 DIAGNOSIS — M25562 Pain in left knee: Secondary | ICD-10-CM | POA: Diagnosis not present

## 2022-06-18 DIAGNOSIS — M6281 Muscle weakness (generalized): Secondary | ICD-10-CM | POA: Diagnosis not present

## 2022-06-23 DIAGNOSIS — M6281 Muscle weakness (generalized): Secondary | ICD-10-CM | POA: Diagnosis not present

## 2022-06-23 DIAGNOSIS — M25562 Pain in left knee: Secondary | ICD-10-CM | POA: Diagnosis not present

## 2022-06-26 DIAGNOSIS — M25562 Pain in left knee: Secondary | ICD-10-CM | POA: Diagnosis not present

## 2022-06-26 DIAGNOSIS — M6281 Muscle weakness (generalized): Secondary | ICD-10-CM | POA: Diagnosis not present

## 2022-06-29 DIAGNOSIS — F1111 Opioid abuse, in remission: Secondary | ICD-10-CM | POA: Diagnosis not present

## 2022-06-30 DIAGNOSIS — M6281 Muscle weakness (generalized): Secondary | ICD-10-CM | POA: Diagnosis not present

## 2022-06-30 DIAGNOSIS — M25562 Pain in left knee: Secondary | ICD-10-CM | POA: Diagnosis not present

## 2022-07-02 DIAGNOSIS — M25562 Pain in left knee: Secondary | ICD-10-CM | POA: Diagnosis not present

## 2022-07-02 DIAGNOSIS — M6281 Muscle weakness (generalized): Secondary | ICD-10-CM | POA: Diagnosis not present

## 2022-07-07 DIAGNOSIS — M6281 Muscle weakness (generalized): Secondary | ICD-10-CM | POA: Diagnosis not present

## 2022-07-07 DIAGNOSIS — M25562 Pain in left knee: Secondary | ICD-10-CM | POA: Diagnosis not present

## 2022-07-09 DIAGNOSIS — M6281 Muscle weakness (generalized): Secondary | ICD-10-CM | POA: Diagnosis not present

## 2022-07-09 DIAGNOSIS — M25562 Pain in left knee: Secondary | ICD-10-CM | POA: Diagnosis not present

## 2022-07-13 DIAGNOSIS — F1111 Opioid abuse, in remission: Secondary | ICD-10-CM | POA: Diagnosis not present

## 2022-07-14 DIAGNOSIS — M6281 Muscle weakness (generalized): Secondary | ICD-10-CM | POA: Diagnosis not present

## 2022-07-14 DIAGNOSIS — M25562 Pain in left knee: Secondary | ICD-10-CM | POA: Diagnosis not present

## 2022-07-21 DIAGNOSIS — Z96652 Presence of left artificial knee joint: Secondary | ICD-10-CM | POA: Diagnosis not present

## 2022-07-22 DIAGNOSIS — M6281 Muscle weakness (generalized): Secondary | ICD-10-CM | POA: Diagnosis not present

## 2022-07-22 DIAGNOSIS — M25562 Pain in left knee: Secondary | ICD-10-CM | POA: Diagnosis not present

## 2022-07-24 DIAGNOSIS — M25562 Pain in left knee: Secondary | ICD-10-CM | POA: Diagnosis not present

## 2022-07-24 DIAGNOSIS — M6281 Muscle weakness (generalized): Secondary | ICD-10-CM | POA: Diagnosis not present

## 2022-07-27 ENCOUNTER — Ambulatory Visit: Payer: PPO | Admitting: Podiatry

## 2022-07-27 DIAGNOSIS — F1111 Opioid abuse, in remission: Secondary | ICD-10-CM | POA: Diagnosis not present

## 2022-07-28 DIAGNOSIS — M25562 Pain in left knee: Secondary | ICD-10-CM | POA: Diagnosis not present

## 2022-07-28 DIAGNOSIS — M6281 Muscle weakness (generalized): Secondary | ICD-10-CM | POA: Diagnosis not present

## 2022-07-31 ENCOUNTER — Ambulatory Visit: Payer: PPO | Admitting: Podiatry

## 2022-08-07 ENCOUNTER — Ambulatory Visit (INDEPENDENT_AMBULATORY_CARE_PROVIDER_SITE_OTHER): Payer: PPO | Admitting: Podiatry

## 2022-08-07 DIAGNOSIS — Z91199 Patient's noncompliance with other medical treatment and regimen due to unspecified reason: Secondary | ICD-10-CM

## 2022-08-07 NOTE — Progress Notes (Signed)
Pt was a no show for apt, CG 

## 2022-08-10 DIAGNOSIS — F1111 Opioid abuse, in remission: Secondary | ICD-10-CM | POA: Diagnosis not present

## 2022-08-12 DIAGNOSIS — L97919 Non-pressure chronic ulcer of unspecified part of right lower leg with unspecified severity: Secondary | ICD-10-CM | POA: Diagnosis not present

## 2022-08-24 DIAGNOSIS — F1111 Opioid abuse, in remission: Secondary | ICD-10-CM | POA: Diagnosis not present

## 2022-08-31 ENCOUNTER — Encounter (HOSPITAL_BASED_OUTPATIENT_CLINIC_OR_DEPARTMENT_OTHER): Payer: PPO | Attending: General Surgery | Admitting: General Surgery

## 2022-08-31 DIAGNOSIS — L97312 Non-pressure chronic ulcer of right ankle with fat layer exposed: Secondary | ICD-10-CM | POA: Diagnosis present

## 2022-08-31 DIAGNOSIS — L97812 Non-pressure chronic ulcer of other part of right lower leg with fat layer exposed: Secondary | ICD-10-CM | POA: Diagnosis not present

## 2022-08-31 DIAGNOSIS — I890 Lymphedema, not elsewhere classified: Secondary | ICD-10-CM | POA: Diagnosis not present

## 2022-08-31 DIAGNOSIS — I872 Venous insufficiency (chronic) (peripheral): Secondary | ICD-10-CM | POA: Insufficient documentation

## 2022-08-31 DIAGNOSIS — I2510 Atherosclerotic heart disease of native coronary artery without angina pectoris: Secondary | ICD-10-CM | POA: Diagnosis not present

## 2022-09-01 NOTE — Progress Notes (Signed)
FRANKLEN, ZIDEK ([id number]) [id number] Page 1 of 4 Visit Report for 08/31/2022 Abuse Risk Screen Details Patient Name: Date of Service: [name], [place] 08/31/2022 7:30 A M Medical Record Number: [id number] Patient Account Number: [id number] Date of Birth/Sex: Treating RN: 03/01/1961 (62 y.o. [name] Primary Care Ned Kakar: Mortimer Fries, Jonny Ruiz Other Clinician: Referring Pantelis Elgersma: Treating Lurlene Ronda/Extender: Lacretia Leigh in Treatment: 0 Abuse Risk Screen Items Answer ABUSE RISK SCREEN: Has anyone close to you tried to hurt or harm you recentlyo No Do you feel uncomfortable with anyone in your familyo No Has anyone forced you do things that you didnt want to doo No Electronic Signature(s) Signed: 08/31/2022 5:27:50 PM By: Karie Schwalbe RN Entered By: Karie Schwalbe on 08/31/2022 08:07:22 -------------------------------------------------------------------------------- Activities of Daily Living Details Patient Name: Date of Service: [name], [place] UL J. 08/31/2022 7:30 A M Medical Record Number: [id number] Patient Account Number: [id number] Date of Birth/Sex: Treating RN: [birth year] (61 y.o. [name] Primary Care Kerin Cecchi: Mortimer Fries, Jonny Ruiz Other Clinician: Referring Janera Peugh: Treating Brelyn Woehl/Extender: Lacretia Leigh in Treatment: 0 Activities of Daily Living Items Answer Activities of Daily Living (Please select one for each item) Drive Automobile Completely Able T Medications ake Completely Able Use T elephone Completely Able Care for Appearance Completely Able Use T oilet Completely Able Bath / Shower Completely Able Dress Self Completely Able Feed Self Completely Able Walk Completely Able Get In / Out Bed Completely Able Housework Completely Able Prepare Meals Completely Able Handle Money Completely Able Shop for Self Completely Able Electronic Signature(s) Signed:  08/31/2022 5:27:50 PM By: Karie Schwalbe RN Entered By: Karie Schwalbe on 08/31/2022 08:07:43 -------------------------------------------------------------------------------- Education Screening Details Patient Name: Date of Service: Marvin Burn, [place] UL J. 08/31/2022 7:30 A M Medical Record Number: [id number] Patient Account Number: [id number] Date of Birth/Sex: Treating RN: Apr 20, 1960 (61 y.o. [name] Primary Care Majesti Gambrell: Mortimer Fries, Jonny Ruiz Other Clinician: Referring Cherron Blitzer: Treating Danille Oppedisano/Extender: Lacretia Leigh in Treatment: [address] [name], [name] ([id number]) [id number] Page 2 of 4 Primary Learner Assessed: Patient Learning Preferences/Education Level/Primary Language Learning Preference: Explanation, Demonstration, Printed Material Highest Education Level: College or Above Preferred Language: Economist Language Barrier: No Translator Needed: No Memory Deficit: No Emotional Barrier: No Cultural/Religious Beliefs Affecting Medical Care: No Physical Barrier Impaired Vision: No Impaired Hearing: No Decreased Hand dexterity: No Knowledge/Comprehension Knowledge Level: High Comprehension Level: High Ability to understand verbal instructions: High Motivation Anxiety Level: Calm Cooperation: Cooperative Education Importance: Acknowledges Need Interest in Health Problems: Asks Questions Perception: Coherent Willingness to Engage in Self-Management High Activities: Readiness to Engage in Self-Management High Activities: Electronic Signature(s) Signed: 08/31/2022 5:27:50 PM By: Karie Schwalbe RN Entered By: Karie Schwalbe on 08/31/2022 08:08:12 -------------------------------------------------------------------------------- Fall Risk Assessment Details Patient Name: Date of Service: Marvin Burn, PA UL J. 08/31/2022 7:30 A M Medical Record Number: [id number] Patient Account Number: [id number] Date of  Birth/Sex: Treating RN: May 28, 1960 (61 y.o. [name] Primary Care Kashauna Celmer: Mortimer Fries, Jonny Ruiz Other Clinician: Referring Sueo Cullen: Treating Payzlee Ryder/Extender: Lacretia Leigh in Treatment: 0 Fall Risk Assessment Items Have you had 2 or more falls in the last 12 monthso 0 No Have you had any fall that resulted in injury in the last 12 monthso 0 No FALLS RISK SCREEN History of falling - immediate or within 3 months 0 No Secondary diagnosis (Do you have 2 or more medical diagnoseso) 0 No Ambulatory aid None/bed rest/wheelchair/nurse 0  No Crutches/cane/walker 0 No Furniture 0 No Intravenous therapy Access/Saline/Heparin Lock 0 No Gait/Transferring Normal/ bed rest/ wheelchair 0 No Weak (short steps with or without shuffle, stooped but able to lift head while walking, may seek 0 No support from furniture) Impaired (short steps with shuffle, may have difficulty arising from chair, head down, impaired 0 No balance) Mental Status Oriented to own ability 0 No Overestimates or forgets limitations 0 No Risk Level: Low Risk Score: 0 [name], [name] ([id number]) [phone] [id number] Page 3 of 4 Electronic Signature(s) -------------------------------------------------------------------------------- Foot Assessment Details Patient Name: Date of Service: [name], [place] 08/31/2022 7:30 A M Medical Record Number: [id number] Patient Account Number: [id number] Date of Birth/Sex: Treating RN: [birth year] (62 y.o. [name] Primary Care Suhayla Chisom: Mortimer Fries, Jonny Ruiz Other Clinician: Referring Rhayne Chatwin: Treating John Vasconcelos/Extender: Lacretia Leigh in Treatment: 0 Foot Assessment Items Site Locations + = Sensation present, - = Sensation absent, C = Callus, U = Ulcer R = Redness, W = Warmth, M = Maceration, PU = Pre-ulcerative lesion F = Fissure, S = Swelling, D = Dryness Assessment Right: Left: Other Deformity:  No No Prior Foot Ulcer: No No Prior Amputation: No No Charcot Joint: No No Ambulatory Status: Ambulatory With Help Assistance Device: Cane Gait: Steady Electronic Signature(s) Signed: 08/31/2022 5:27:50 PM By: Karie Schwalbe RN Entered By: Karie Schwalbe on 08/31/2022 08:10:22 -------------------------------------------------------------------------------- Nutrition Risk Screening Details Patient Name: Date of Service: [name], [place] UL J. 08/31/2022 7:30 A M Medical Record Number: [id number] Patient Account Number: [id number] Date of Birth/Sex: Treating RN: Apr 23, 1960 (61 y.o. [name] Primary Care Skyylar Kopf: Mortimer Fries, Jonny Ruiz Other Clinician: Referring Birtha Hatler: Treating Geovannie Vilar/Extender: Lacretia Leigh in Treatment: 0 Height (in): 71 Weight (lbs): 205 Body Mass Index (BMI): 28.6 [name], [name] ([id number]) [phone] [id number] Page 4 of 4 Nutrition Risk Screening Items Score Screening NUTRITION RISK SCREEN: I have an illness or condition that made me change the kind and/or amount of food I eat 0 No I eat fewer than two meals per day 0 No I eat few fruits and vegetables, or milk products 0 No I have three or more drinks of beer, liquor or wine almost every day 0 No I have tooth or mouth problems that make it hard for me to eat 0 No I don't always have enough money to buy the food I need 0 No I eat alone most of the time 0 No I take three or more different prescribed or over-the-counter drugs a day 0 No Without wanting to, I have lost or gained 10 pounds in the last six months 0 No I am not always physically able to shop, cook and/or feed myself 0 No Nutrition Protocols Good Risk Protocol 0 No interventions needed Moderate Risk Protocol High Risk Proctocol Risk Level: Good Risk Score: 0 Electronic Signature(s) Signed: 08/31/2022 5:27:50 PM By: Karie Schwalbe RN Entered By: Karie Schwalbe on 08/31/2022 66:06:30

## 2022-09-04 ENCOUNTER — Other Ambulatory Visit: Payer: Self-pay | Admitting: Orthopedic Surgery

## 2022-09-04 DIAGNOSIS — M25561 Pain in right knee: Secondary | ICD-10-CM | POA: Diagnosis not present

## 2022-09-04 DIAGNOSIS — G8929 Other chronic pain: Secondary | ICD-10-CM | POA: Diagnosis not present

## 2022-09-04 DIAGNOSIS — F1111 Opioid abuse, in remission: Secondary | ICD-10-CM | POA: Diagnosis not present

## 2022-09-09 ENCOUNTER — Encounter (HOSPITAL_BASED_OUTPATIENT_CLINIC_OR_DEPARTMENT_OTHER): Payer: PPO | Admitting: General Surgery

## 2022-09-09 DIAGNOSIS — L97312 Non-pressure chronic ulcer of right ankle with fat layer exposed: Secondary | ICD-10-CM | POA: Diagnosis not present

## 2022-09-09 DIAGNOSIS — I872 Venous insufficiency (chronic) (peripheral): Secondary | ICD-10-CM | POA: Diagnosis not present

## 2022-09-09 NOTE — Patient Instructions (Signed)
SURGICAL WAITING ROOM VISITATION Patients having surgery or a procedure may have no more than 2 support people in the waiting area - these visitors may rotate in the visitor waiting room.   Due to an increase in RSV and influenza rates and associated hospitalizations, children ages 18 and under may not visit patients in [facility] hospitals. If the patient needs to stay at the hospital during part of their recovery, the visitor guidelines for inpatient rooms apply.  PRE-OP VISITATION  Pre-op nurse will coordinate an appropriate time for 1 support person to accompany the patient in pre-op.  This support person may not rotate.  This visitor will be contacted when the time is appropriate for the visitor to come back in the pre-op area.  Please refer to the [facility] website for the visitor guidelines for Inpatients (after your surgery is over and you are in a regular room).  You are not required to quarantine at this time prior to your surgery. However, you must do this: Hand Hygiene often Do NOT share personal items Notify your provider if you are in close contact with someone who has COVID or you develop fever 100.4 or greater, new onset of sneezing, cough, sore throat, shortness of breath or body aches.  If you test positive for Covid or have been in contact with anyone that has tested positive in the last 10 days please notify you surgeon.    Your procedure is scheduled on:  Monday  September 14, 2022  Report to [facility] Main Entrance: Leota Jacobsen entrance where the [employer] is available.   Report to admitting at: 09:00  AM  +++++Call this number if you have any questions or problems the morning of surgery [phone]  Do not eat food after Midnight the night prior to your surgery/procedure.  After Midnight you may have the following liquids until  08:30 AM DAY OF SURGERY  Clear Liquid Diet Water Black Coffee (sugar ok, NO MILK/CREAM OR CREAMERS)  Tea (sugar ok, NO  MILK/CREAM OR CREAMERS) regular and decaf                             Plain Jell-O  with no fruit (NO RED)                                           Fruit ices (not with fruit pulp, NO RED)                                     Popsicles (NO RED)                                                                  Juice: apple, WHITE grape, WHITE cranberry Sports drinks like Gatorade or Powerade (NO RED)                    The day of surgery:  Drink ONE (1) Pre-Surgery Clear Ensure at  08:30 AM the morning of surgery. Drink in one sitting. Do not  sip.  This drink was given to you during your hospital pre-op appointment visit. Nothing else to drink after completing the Pre-Surgery Clear Ensure  : No candy, chewing gum or throat lozenges.    FOLLOW ANY ADDITIONAL PRE OP INSTRUCTIONS YOU RECEIVED FROM YOUR SURGEON'S OFFICE!!!   Oral Hygiene is also important to reduce your risk of infection.        Remember - BRUSH YOUR TEETH THE MORNING OF SURGERY WITH YOUR REGULAR TOOTHPASTE  Do NOT smoke after Midnight the night before surgery.  Take ONLY these medicines the morning of surgery with A SIP OF WATER: Methadone????   You may not have any metal on your body including  jewelry, and body piercing  Do not wear lotions, powders,  cologne, or deodorant  Men may shave face and neck.  Contacts, Hearing Aids, dentures or bridgework may not be worn into surgery. DENTURES WILL BE REMOVED PRIOR TO SURGERY PLEASE DO NOT APPLY "Poly grip" OR ADHESIVES!!!  You may bring a small overnight bag with you on the day of surgery, only pack items that are not valuable.  IS NOT RESPONSIBLE   FOR VALUABLES THAT ARE LOST OR STOLEN.   Do not bring your home medications to the hospital. The Pharmacy will dispense medications listed on your medication list to you during your admission in the Hospital.  Special Instructions: Bring a copy of your healthcare power of attorney and living will documents the  day of surgery, if you wish to have them scanned into your [facility]- EPIC  Please read over the following fact sheets you were given: IF YOU HAVE QUESTIONS ABOUT YOUR PRE-OP INSTRUCTIONS, PLEASE CALL [phone].         Pre-operative 5 CHG Bath Instructions   You can play a key role in reducing the risk of infection after surgery. Your skin needs to be as free of germs as possible. You can reduce the number of germs on your skin by washing with CHG (chlorhexidine gluconate) soap before surgery. CHG is an antiseptic soap that kills germs and continues to kill germs even after washing.   DO NOT use if you have an allergy to chlorhexidine/CHG or antibacterial soaps. If your skin becomes reddened or irritated, stop using the CHG and notify one of our RNs at [phone]  Please shower with the CHG soap starting 4 days before surgery using the following schedule: START SHOWERS ON FRIDAY Sep 11, 2022  Please keep in mind the following:  DO NOT shave, including legs and underarms, starting the day of your first shower.   You may shave your face at any point before/day of surgery.   Place clean sheets on your bed the day you start using CHG soap. Use a clean washcloth (not used since being washed) for each shower. DO NOT sleep with pets once you start using the CHG.   CHG Shower Instructions:  If you choose to wash your hair and private area, wash first with your normal shampoo/soap.  After you use shampoo/soap, rinse your hair and body thoroughly to remove shampoo/soap residue.  Turn the water OFF and apply about 3 tablespoons (45 ml) of CHG soap to a CLEAN washcloth.  Apply CHG soap ONLY FROM YOUR NECK DOWN TO YOUR TOES (washing for 3-5 minutes)  DO NOT use CHG soap on face, private areas,  open wounds, or sores.  Pay special attention to the area where your surgery is being performed.  If you are having back surgery, having someone wash your back for you may be helpful.  Wait 2 minutes after CHG soap is applied, then you may rinse off the CHG soap.  Pat dry with a clean towel  Put on clean clothes/pajamas   If you choose to wear lotion, please use ONLY the CHG-compatible lotions on the back of this paper.     Additional instructions for the day of surgery: DO NOT APPLY any lotions, deodorants, cologne, or perfumes.   Put on clean/comfortable clothes.  Brush your teeth.  Ask your nurse before applying any prescription medications to the skin.      CHG Compatible Lotions   Aveeno Moisturizing lotion  Cetaphil Moisturizing Cream  Cetaphil Moisturizing Lotion  Clairol Herbal Essence Moisturizing Lotion, Dry Skin  Clairol Herbal Essence Moisturizing Lotion, Extra Dry Skin  Clairol Herbal Essence Moisturizing Lotion, Normal Skin  Curel Age Defying Therapeutic Moisturizing Lotion with Alpha Hydroxy  Curel Extreme Care Body Lotion  Curel Soothing Hands Moisturizing Hand Lotion  Curel Therapeutic Moisturizing Cream, Fragrance-Free  Curel Therapeutic Moisturizing Lotion, Fragrance-Free  Curel Therapeutic Moisturizing Lotion, Original Formula  Eucerin Daily Replenishing Lotion  Eucerin Dry Skin Therapy Plus Alpha Hydroxy Crme  Eucerin Dry Skin Therapy Plus Alpha Hydroxy Lotion  Eucerin Original Crme  Eucerin Original Lotion  Eucerin Plus Crme Eucerin Plus Lotion  Eucerin TriLipid Replenishing Lotion  Keri Anti-Bacterial Hand Lotion  Keri Deep Conditioning Original Lotion Dry Skin Formula Softly Scented  Keri Deep Conditioning Original Lotion, Fragrance Free Sensitive Skin Formula  Keri Lotion Fast Absorbing Fragrance Free Sensitive Skin Formula  Keri Lotion Fast Absorbing Softly Scented Dry Skin Formula  Keri Original Lotion  Keri Skin Renewal Lotion Keri Silky  Smooth Lotion  Keri Silky Smooth Sensitive Skin Lotion  Nivea Body Creamy Conditioning Oil  Nivea Body Extra Enriched Lotion  Nivea Body Original Lotion  Nivea Body Sheer Moisturizing Lotion Nivea Crme  Nivea Skin Firming Lotion  NutraDerm 30 Skin Lotion  NutraDerm Skin Lotion  NutraDerm Therapeutic Skin Cream  NutraDerm Therapeutic Skin Lotion  ProShield Protective Hand Cream  Provon moisturizing lotion  FAILURE TO FOLLOW THESE INSTRUCTIONS MAY RESULT IN THE CANCELLATION OF YOUR SURGERY  PATIENT SIGNATURE_________________________________  NURSE SIGNATURE__________________________________  ________________________________________________________________________        [name]    An incentive spirometer is a tool that can help keep your lungs clear and active. This tool measures how well you are filling your lungs with each breath. Taking  long deep breaths may help reverse or decrease the chance of developing breathing (pulmonary) problems (especially infection) following: A long period of time when you are unable to move or be active. BEFORE THE PROCEDURE  If the spirometer includes an indicator to show your best effort, your nurse or respiratory therapist will set it to a desired goal. If possible, sit up straight or lean slightly forward. Try not to slouch. Hold the incentive spirometer in an upright position. INSTRUCTIONS FOR USE  Sit on the edge of your bed if possible, or sit up as far as you can in bed or on a chair. Hold the incentive spirometer in an upright position. Breathe out normally. Place the mouthpiece in your mouth and seal your lips tightly around it. Breathe in slowly and as deeply as possible, raising the piston or the ball toward the top of the column. Hold your breath for 3-5 seconds or for as long as possible. Allow the piston or ball to fall to the bottom of the column. Remove the mouthpiece from your mouth and breathe out  normally. Rest for a few seconds and repeat Steps 1 through 7 at least 10 times every 1-2 hours when you are awake. Take your time and take a few normal breaths between deep breaths. The spirometer may include an indicator to show your best effort. Use the indicator as a goal to work toward during each repetition. After each set of 10 deep breaths, practice coughing to be sure your lungs are clear. If you have an incision (the cut made at the time of surgery), support your incision when coughing by placing a pillow or rolled up towels firmly against it. Once you are able to get out of bed, walk around indoors and cough well. You may stop using the incentive spirometer when instructed by your caregiver.  RISKS AND COMPLICATIONS Take your time so you do not get dizzy or light-headed. If you are in pain, you may need to take or ask for pain medication before doing incentive spirometry. It is harder to take a deep breath if you are having pain. AFTER USE Rest and breathe slowly and easily. It can be helpful to keep track of a log of your progress. Your caregiver can provide you with a simple table to help with this. If you are using the spirometer at home, follow these instructions: SEEK MEDICAL CARE IF:  You are having difficultly using the spirometer. You have trouble using the spirometer as often as instructed. Your pain medication is not giving enough relief while using the spirometer. You develop fever of 100.5 F (38.1 C) or higher.                                                                                                    SEEK IMMEDIATE MEDICAL CARE IF:  You cough up bloody sputum that had not been present before. You develop fever of 102 F (38.9 C) or greater. You develop worsening pain at or near the incision site. MAKE SURE YOU:  Understand these instructions. Will watch your condition. Will  get help right away if you are not doing well or get worse. Document Released:  08/10/2006 Document Revised: 06/22/2011 Document Reviewed: 10/11/2006 [facility] Patient Information 2014 [place], [place].

## 2022-09-09 NOTE — Progress Notes (Signed)
COVID Vaccine received:  []  No [x]  Yes Date of any COVID positive Test in last 90 days:  None  PCP - Gwendlyn Deutscher, MD ( patient hasn't seen any PCP recently, Dr Jeanie Sewer moved his practice to [facility]) Cardiologist - Gypsy Balsam, MD  Chest x-ray - 2017  Epic EKG - 04-10-2022 Epic  Stress Test -  ECHO - 04-15-2022  Epic Cardiac Cath  CTA Coronary Morphology- Calcium score 844 on 01-15-2022  PCR screen: [x]  Ordered & Completed           []   No Order but Needs PROFEND           []   N/A for this surgery  Surgery Plan:  []  Ambulatory                            [x]  Outpatient in bed                            []  Admit  Anesthesia:    []  General  [x]  Spinal                           []   Choice []   MAC  Pacemaker / ICD device [x]  No []  Yes   Spinal Cord Stimulator:[x]  No []  Yes       History of Sleep Apnea? [x]  No []  Yes   CPAP used?- [x]  No []  Yes    Does the patient monitor blood sugar?          []  No []  Yes  [x]  N/A  Patient has: [x]  NO Hx DM   []  Pre-DM                 []  DM1  []   DM2  Blood Thinner / Instructions: none Aspirin Instructions:   none  ERAS Protocol Ordered: []  No  [x]  Yes PRE-SURGERY [x]  ENSURE  []  G2  Patient is to be NPO after: 08:30 am  Patient was given the 5 CHG shower / bath instructions for THA / TKA / Total or Reverse Shoulder arthroplasty surgery along with 2 bottles of the CHG soap. Patient will start this on:  Friday  Sep 11, 2022   All questions were asked and answered, Patient voiced understanding of this process.   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 Patient was instructed to contact [facility] and let them know he is having surgery.  He will continue taking his regular dose and schedule of Methadone the DOS.   Activity level: Patient is unable to climb a flight of stairs without difficulty; [x]  No CP  [x]  No SOB, but would have leg pain. Patient can  perform ADLs without  assistance.   Anesthesia review: CAD, CHF, anemia, murmur, Hep C, HTN  Patient denies shortness of breath, fever, cough and chest pain at PAT appointment.  Patient verbalized understanding and agreement to the Pre-Surgical Instructions that were given to them at this PAT appointment. Patient was also educated of the need to review these PAT instructions again prior to his surgery.I reviewed the appropriate phone numbers to call if they have any and questions or concerns.

## 2022-09-10 ENCOUNTER — Encounter (HOSPITAL_COMMUNITY)
Admission: RE | Admit: 2022-09-10 | Discharge: 2022-09-10 | Disposition: A | Discharge status: Home / Self Care | Payer: PPO | Source: Ambulatory Visit | Attending: Orthopedic Surgery | Admitting: Orthopedic Surgery

## 2022-09-10 ENCOUNTER — Other Ambulatory Visit: Payer: Self-pay

## 2022-09-10 ENCOUNTER — Encounter (HOSPITAL_COMMUNITY): Payer: Self-pay

## 2022-09-10 VITALS — BP 111/70 | HR 63 | Temp 98.3°F | Resp 20 | Ht 71.0 in | Wt 208.0 lb

## 2022-09-10 DIAGNOSIS — Z01812 Encounter for preprocedural laboratory examination: Secondary | ICD-10-CM | POA: Diagnosis not present

## 2022-09-10 DIAGNOSIS — G8929 Other chronic pain: Secondary | ICD-10-CM | POA: Diagnosis not present

## 2022-09-10 DIAGNOSIS — M25561 Pain in right knee: Secondary | ICD-10-CM | POA: Diagnosis not present

## 2022-09-10 DIAGNOSIS — Z01818 Encounter for other preprocedural examination: Secondary | ICD-10-CM

## 2022-09-10 HISTORY — DX: Essential (primary) hypertension: I10

## 2022-09-10 LAB — CBC WITH DIFFERENTIAL/PLATELET
Abs Immature Granulocytes: 0 10*3/uL (ref 0.00–0.07)
Basophils Absolute: 0.1 10*3/uL (ref 0.0–0.1)
Basophils Relative: 1 %
Eosinophils Absolute: 0.1 10*3/uL (ref 0.0–0.5)
Eosinophils Relative: 2 %
HCT: 33.2 % — ABNORMAL LOW (ref 39.0–52.0)
Hemoglobin: 10.8 g/dL — ABNORMAL LOW (ref 13.0–17.0)
Immature Granulocytes: 0 %
Lymphocytes Relative: 33 %
Lymphs Abs: 1.5 10*3/uL (ref 0.7–4.0)
MCH: 29.3 pg (ref 26.0–34.0)
MCHC: 32.5 g/dL (ref 30.0–36.0)
MCV: 90.2 fL (ref 80.0–100.0)
Monocytes Absolute: 0.4 10*3/uL (ref 0.1–1.0)
Monocytes Relative: 9 %
Neutro Abs: 2.5 10*3/uL (ref 1.7–7.7)
Neutrophils Relative %: 55 %
Platelets: 272 10*3/uL (ref 150–400)
RBC: 3.68 MIL/uL — ABNORMAL LOW (ref 4.22–5.81)
RDW: 14.6 % (ref 11.5–15.5)
WBC: 4.5 10*3/uL (ref 4.0–10.5)
nRBC: 0 % (ref 0.0–0.2)

## 2022-09-10 LAB — COMPREHENSIVE METABOLIC PANEL
ALT: 10 U/L (ref 0–44)
AST: 15 U/L (ref 15–41)
Albumin: 3.8 g/dL (ref 3.5–5.0)
Alkaline Phosphatase: 82 U/L (ref 38–126)
Anion gap: 8 (ref 5–15)
BUN: 17 mg/dL (ref 6–20)
CO2: 27 mmol/L (ref 22–32)
Calcium: 8.6 mg/dL — ABNORMAL LOW (ref 8.9–10.3)
Chloride: 102 mmol/L (ref 98–111)
Creatinine, Ser: 0.71 mg/dL (ref 0.61–1.24)
GFR, Estimated: >60 mL/min (ref >60)
Glucose, Bld: 77 mg/dL (ref 70–99)
Potassium: 3.5 mmol/L (ref 3.5–5.1)
Sodium: 137 mmol/L (ref 135–145)
Total Bilirubin: 0.4 mg/dL (ref 0.3–1.2)
Total Protein: 7.3 g/dL (ref 6.5–8.1)

## 2022-09-10 LAB — SURGICAL PCR SCREEN
MRSA, PCR: NEGATIVE
Staphylococcus aureus: NEGATIVE

## 2022-09-14 ENCOUNTER — Encounter (HOSPITAL_COMMUNITY)
Admission: RE | Disposition: A | Discharge status: Home / Self Care | Payer: Self-pay | Source: Home / Self Care | Attending: Orthopedic Surgery

## 2022-09-14 ENCOUNTER — Other Ambulatory Visit: Payer: Self-pay

## 2022-09-14 ENCOUNTER — Ambulatory Visit (HOSPITAL_COMMUNITY): Payer: PPO | Admitting: Physician Assistant

## 2022-09-14 ENCOUNTER — Encounter (HOSPITAL_COMMUNITY): Payer: Self-pay | Admitting: Orthopedic Surgery

## 2022-09-14 ENCOUNTER — Observation Stay (HOSPITAL_COMMUNITY)
Admission: RE | Admit: 2022-09-14 | Discharge: 2022-09-15 | Disposition: A | Discharge status: Home / Self Care | Payer: PPO | Attending: Orthopedic Surgery | Admitting: Orthopedic Surgery

## 2022-09-14 ENCOUNTER — Encounter (HOSPITAL_BASED_OUTPATIENT_CLINIC_OR_DEPARTMENT_OTHER): Payer: PPO | Admitting: General Surgery

## 2022-09-14 ENCOUNTER — Ambulatory Visit (HOSPITAL_BASED_OUTPATIENT_CLINIC_OR_DEPARTMENT_OTHER): Payer: PPO | Admitting: Anesthesiology

## 2022-09-14 DIAGNOSIS — I2510 Atherosclerotic heart disease of native coronary artery without angina pectoris: Secondary | ICD-10-CM | POA: Insufficient documentation

## 2022-09-14 DIAGNOSIS — Z87891 Personal history of nicotine dependence: Secondary | ICD-10-CM

## 2022-09-14 DIAGNOSIS — Z96659 Presence of unspecified artificial knee joint: Secondary | ICD-10-CM

## 2022-09-14 DIAGNOSIS — I10 Essential (primary) hypertension: Secondary | ICD-10-CM

## 2022-09-14 DIAGNOSIS — I509 Heart failure, unspecified: Secondary | ICD-10-CM | POA: Diagnosis not present

## 2022-09-14 DIAGNOSIS — M1711 Unilateral primary osteoarthritis, right knee: Secondary | ICD-10-CM | POA: Diagnosis not present

## 2022-09-14 DIAGNOSIS — I110 Hypertensive heart disease with heart failure: Secondary | ICD-10-CM | POA: Insufficient documentation

## 2022-09-14 DIAGNOSIS — Z96651 Presence of right artificial knee joint: Secondary | ICD-10-CM | POA: Diagnosis not present

## 2022-09-14 DIAGNOSIS — Z79899 Other long term (current) drug therapy: Secondary | ICD-10-CM | POA: Diagnosis not present

## 2022-09-14 DIAGNOSIS — D649 Anemia, unspecified: Secondary | ICD-10-CM | POA: Diagnosis not present

## 2022-09-14 DIAGNOSIS — G8918 Other acute postprocedural pain: Secondary | ICD-10-CM | POA: Diagnosis not present

## 2022-09-14 DIAGNOSIS — Z96652 Presence of left artificial knee joint: Secondary | ICD-10-CM | POA: Insufficient documentation

## 2022-09-14 HISTORY — PX: TOTAL KNEE ARTHROPLASTY: SHX125

## 2022-09-14 SURGERY — ARTHROPLASTY, KNEE, TOTAL
Anesthesia: Spinal | Site: Knee | Laterality: Right

## 2022-09-14 MED ORDER — ONDANSETRON HCL 4 MG/2ML IJ SOLN
INTRAMUSCULAR | Status: AC
  Filled 2022-09-14: qty 2

## 2022-09-14 MED ORDER — FERROUS SULFATE 325 (65 FE) MG PO TABS
325.0000 mg | ORAL_TABLET | Freq: Three times a day (TID) | ORAL | Status: DC
  Administered 2022-09-15 (×3): 325 mg via ORAL
  Filled 2022-09-14 (×3): qty 1

## 2022-09-14 MED ORDER — DEXAMETHASONE SODIUM PHOSPHATE 10 MG/ML IJ SOLN
10.0000 mg | Freq: Once | INTRAMUSCULAR | Status: AC
  Administered 2022-09-15: 10 mg via INTRAVENOUS
  Filled 2022-09-14: qty 1

## 2022-09-14 MED ORDER — WATER FOR IRRIGATION, STERILE IR SOLN
Status: DC | PRN
  Administered 2022-09-14: 2000 mL

## 2022-09-14 MED ORDER — EPHEDRINE SULFATE-NACL 50-0.9 MG/10ML-% IV SOSY
PREFILLED_SYRINGE | INTRAVENOUS | Status: DC | PRN
  Administered 2022-09-14 (×3): 7.5 mg via INTRAVENOUS

## 2022-09-14 MED ORDER — BUPIVACAINE LIPOSOME 1.3 % IJ SUSP: 20.0000 mL | Freq: Once | INTRAMUSCULAR | Status: DC

## 2022-09-14 MED ORDER — CEFAZOLIN SODIUM-DEXTROSE 2-4 GM/100ML-% IV SOLN
2.0000 g | Freq: Four times a day (QID) | INTRAVENOUS | Status: AC
  Administered 2022-09-14 (×2): 2 g via INTRAVENOUS
  Filled 2022-09-14 (×2): qty 100

## 2022-09-14 MED ORDER — OXYCODONE HCL 5 MG/5ML PO SOLN: 5.0000 mg | Freq: Once | ORAL | Status: DC | PRN

## 2022-09-14 MED ORDER — PROPOFOL 1000 MG/100ML IV EMUL
INTRAVENOUS | Status: AC
  Filled 2022-09-14: qty 100

## 2022-09-14 MED ORDER — DIPHENHYDRAMINE HCL 12.5 MG/5ML PO ELIX: 12.5000 mg | ORAL_SOLUTION | ORAL | Status: DC | PRN

## 2022-09-14 MED ORDER — PHENOL 1.4 % MT LIQD: 1.0000 | OROMUCOSAL | Status: DC | PRN

## 2022-09-14 MED ORDER — OXYCODONE HCL 5 MG PO TABS: 5.0000 mg | ORAL_TABLET | Freq: Once | ORAL | Status: DC | PRN

## 2022-09-14 MED ORDER — HYDROMORPHONE HCL 2 MG PO TABS
2.0000 mg | ORAL_TABLET | ORAL | Status: DC | PRN
  Administered 2022-09-14: 3 mg via ORAL
  Administered 2022-09-14: 2 mg via ORAL
  Administered 2022-09-15 (×3): 3 mg via ORAL
  Filled 2022-09-14: qty 2
  Filled 2022-09-14: qty 1
  Filled 2022-09-14 (×3): qty 2
  Filled 2022-09-14: qty 1

## 2022-09-14 MED ORDER — ONDANSETRON HCL 4 MG/2ML IJ SOLN: 4.0000 mg | Freq: Four times a day (QID) | INTRAMUSCULAR | Status: DC | PRN

## 2022-09-14 MED ORDER — ZOLPIDEM TARTRATE 5 MG PO TABS: 5.0000 mg | ORAL_TABLET | Freq: Every evening | ORAL | Status: DC | PRN

## 2022-09-14 MED ORDER — ACETAMINOPHEN 500 MG PO TABS
1000.0000 mg | ORAL_TABLET | Freq: Four times a day (QID) | ORAL | Status: AC
  Administered 2022-09-14 – 2022-09-15 (×4): 1000 mg via ORAL
  Filled 2022-09-14 (×4): qty 2

## 2022-09-14 MED ORDER — POVIDONE-IODINE 10 % EX SWAB: 2.0000 | Freq: Once | CUTANEOUS | Status: DC

## 2022-09-14 MED ORDER — METHADONE HCL 10 MG/ML PO CONC
195.0000 mg | Freq: Every day | ORAL | Status: DC
  Administered 2022-09-15: 195 mg via ORAL
  Filled 2022-09-14: qty 20

## 2022-09-14 MED ORDER — SODIUM CHLORIDE (PF) 0.9 % IJ SOLN
INTRAMUSCULAR | Status: DC | PRN
  Administered 2022-09-14: 20 mL

## 2022-09-14 MED ORDER — MENTHOL 3 MG MT LOZG: 1.0000 | LOZENGE | OROMUCOSAL | Status: DC | PRN

## 2022-09-14 MED ORDER — ROPIVACAINE HCL 7.5 MG/ML IJ SOLN
INTRAMUSCULAR | Status: DC | PRN
  Administered 2022-09-14: 20 mL via PERINEURAL

## 2022-09-14 MED ORDER — CHLORHEXIDINE GLUCONATE 0.12 % MT SOLN
15.0000 mL | Freq: Once | OROMUCOSAL | Status: AC
  Administered 2022-09-14: 15 mL via OROMUCOSAL

## 2022-09-14 MED ORDER — FENTANYL CITRATE PF 50 MCG/ML IJ SOSY
100.0000 ug | PREFILLED_SYRINGE | INTRAMUSCULAR | Status: DC
  Administered 2022-09-14: 50 ug via INTRAVENOUS
  Filled 2022-09-14: qty 2

## 2022-09-14 MED ORDER — BISACODYL 5 MG PO TBEC: 5.0000 mg | DELAYED_RELEASE_TABLET | Freq: Every day | ORAL | Status: DC | PRN

## 2022-09-14 MED ORDER — SODIUM CHLORIDE 0.9 % IV SOLN: INTRAVENOUS | Status: DC

## 2022-09-14 MED ORDER — PROPOFOL 500 MG/50ML IV EMUL
INTRAVENOUS | Status: DC | PRN
  Administered 2022-09-14: 75 ug/kg/min via INTRAVENOUS

## 2022-09-14 MED ORDER — SENNOSIDES-DOCUSATE SODIUM 8.6-50 MG PO TABS: 1.0000 | ORAL_TABLET | Freq: Every evening | ORAL | Status: DC | PRN

## 2022-09-14 MED ORDER — BUPIVACAINE HCL 0.25 % IJ SOLN
INTRAMUSCULAR | Status: AC
  Filled 2022-09-14: qty 1

## 2022-09-14 MED ORDER — METHOCARBAMOL 500 MG PO TABS
1000.0000 mg | ORAL_TABLET | Freq: Four times a day (QID) | ORAL | Status: DC | PRN
  Administered 2022-09-15 (×2): 1000 mg via ORAL
  Filled 2022-09-14 (×2): qty 2

## 2022-09-14 MED ORDER — PROPOFOL 10 MG/ML IV BOLUS
INTRAVENOUS | Status: DC | PRN
  Administered 2022-09-14 (×2): 20 mg via INTRAVENOUS

## 2022-09-14 MED ORDER — DOCUSATE SODIUM 100 MG PO CAPS
100.0000 mg | ORAL_CAPSULE | Freq: Two times a day (BID) | ORAL | Status: DC
  Administered 2022-09-14 – 2022-09-15 (×2): 100 mg via ORAL
  Filled 2022-09-14 (×2): qty 1

## 2022-09-14 MED ORDER — MIDAZOLAM HCL 2 MG/2ML IJ SOLN
2.0000 mg | INTRAMUSCULAR | Status: DC
  Administered 2022-09-14: 2 mg via INTRAVENOUS
  Filled 2022-09-14: qty 2

## 2022-09-14 MED ORDER — PHENYLEPHRINE HCL-NACL 20-0.9 MG/250ML-% IV SOLN
INTRAVENOUS | Status: DC | PRN
  Administered 2022-09-14: 10 ug/min via INTRAVENOUS

## 2022-09-14 MED ORDER — ONDANSETRON HCL 4 MG/2ML IJ SOLN: 4.0000 mg | Freq: Once | INTRAMUSCULAR | Status: DC | PRN

## 2022-09-14 MED ORDER — TRANEXAMIC ACID-NACL 1000-0.7 MG/100ML-% IV SOLN
1000.0000 mg | INTRAVENOUS | Status: AC
  Administered 2022-09-14: 1000 mg via INTRAVENOUS
  Filled 2022-09-14: qty 100

## 2022-09-14 MED ORDER — KETOROLAC TROMETHAMINE 30 MG/ML IJ SOLN: 30.0000 mg | Freq: Once | INTRAMUSCULAR | Status: DC | PRN

## 2022-09-14 MED ORDER — METOCLOPRAMIDE HCL 5 MG/ML IJ SOLN: 5.0000 mg | Freq: Three times a day (TID) | INTRAMUSCULAR | Status: DC | PRN

## 2022-09-14 MED ORDER — LIDOCAINE HCL (PF) 2 % IJ SOLN
INTRAMUSCULAR | Status: AC
  Filled 2022-09-14: qty 5

## 2022-09-14 MED ORDER — METHOCARBAMOL 500 MG IVPB - SIMPLE MED: 500.0000 mg | Freq: Four times a day (QID) | INTRAVENOUS | Status: DC | PRN

## 2022-09-14 MED ORDER — ALUM & MAG HYDROXIDE-SIMETH 200-200-20 MG/5ML PO SUSP: 30.0000 mL | ORAL | Status: DC | PRN

## 2022-09-14 MED ORDER — BUPIVACAINE IN DEXTROSE 0.75-8.25 % IT SOLN
INTRATHECAL | Status: DC | PRN
  Administered 2022-09-14: 1.6 mL via INTRATHECAL

## 2022-09-14 MED ORDER — ORAL CARE MOUTH RINSE: 15.0000 mL | Freq: Once | OROMUCOSAL | Status: AC

## 2022-09-14 MED ORDER — METOCLOPRAMIDE HCL 5 MG PO TABS: 5.0000 mg | ORAL_TABLET | Freq: Three times a day (TID) | ORAL | Status: DC | PRN

## 2022-09-14 MED ORDER — ASPIRIN 81 MG PO CHEW
81.0000 mg | CHEWABLE_TABLET | Freq: Two times a day (BID) | ORAL | Status: DC
  Administered 2022-09-14 – 2022-09-15 (×2): 81 mg via ORAL
  Filled 2022-09-14 (×2): qty 1

## 2022-09-14 MED ORDER — EPINEPHRINE PF 1 MG/ML IJ SOLN
INTRAMUSCULAR | Status: AC
  Filled 2022-09-14: qty 1

## 2022-09-14 MED ORDER — BUPIVACAINE LIPOSOME 1.3 % IJ SUSP
INTRAMUSCULAR | Status: AC
  Filled 2022-09-14: qty 20

## 2022-09-14 MED ORDER — BUPIVACAINE-EPINEPHRINE 0.25% -1:200000 IJ SOLN
INTRAMUSCULAR | Status: DC | PRN
  Administered 2022-09-14: 30 mL

## 2022-09-14 MED ORDER — FLEET ENEMA 7-19 GM/118ML RE ENEM: 1.0000 | ENEMA | Freq: Once | RECTAL | Status: DC | PRN

## 2022-09-14 MED ORDER — DEXAMETHASONE SODIUM PHOSPHATE 10 MG/ML IJ SOLN
INTRAMUSCULAR | Status: AC
  Filled 2022-09-14: qty 1

## 2022-09-14 MED ORDER — SODIUM CHLORIDE 0.9 % IR SOLN
Status: DC | PRN
  Administered 2022-09-14: 1000 mL

## 2022-09-14 MED ORDER — BUPIVACAINE LIPOSOME 1.3 % IJ SUSP
INTRAMUSCULAR | Status: DC | PRN
  Administered 2022-09-14: 20 mL

## 2022-09-14 MED ORDER — ONDANSETRON HCL 4 MG PO TABS: 4.0000 mg | ORAL_TABLET | Freq: Four times a day (QID) | ORAL | Status: DC | PRN

## 2022-09-14 MED ORDER — CEFAZOLIN SODIUM-DEXTROSE 2-4 GM/100ML-% IV SOLN
2.0000 g | INTRAVENOUS | Status: AC
  Administered 2022-09-14: 2 g via INTRAVENOUS
  Filled 2022-09-14: qty 100

## 2022-09-14 MED ORDER — HYDROMORPHONE HCL 1 MG/ML IJ SOLN: 0.2500 mg | INTRAMUSCULAR | Status: DC | PRN

## 2022-09-14 MED ORDER — ACETAMINOPHEN 500 MG PO TABS
1000.0000 mg | ORAL_TABLET | Freq: Once | ORAL | Status: AC
  Administered 2022-09-14: 1000 mg via ORAL
  Filled 2022-09-14: qty 2

## 2022-09-14 MED ORDER — 0.9 % SODIUM CHLORIDE (POUR BTL) OPTIME
TOPICAL | Status: DC | PRN
  Administered 2022-09-14: 1000 mL

## 2022-09-14 MED ORDER — EPHEDRINE 5 MG/ML INJ
INTRAVENOUS | Status: AC
  Filled 2022-09-14: qty 5

## 2022-09-14 MED ORDER — METHOCARBAMOL 500 MG PO TABS
500.0000 mg | ORAL_TABLET | Freq: Four times a day (QID) | ORAL | Status: DC | PRN
  Administered 2022-09-14: 500 mg via ORAL
  Filled 2022-09-14: qty 1

## 2022-09-14 MED ORDER — PANTOPRAZOLE SODIUM 40 MG PO TBEC
40.0000 mg | DELAYED_RELEASE_TABLET | Freq: Every day | ORAL | Status: DC
  Administered 2022-09-15: 40 mg via ORAL
  Filled 2022-09-14: qty 1

## 2022-09-14 MED ORDER — LACTATED RINGERS IV SOLN: INTRAVENOUS | Status: DC

## 2022-09-14 MED ORDER — SODIUM CHLORIDE (PF) 0.9 % IJ SOLN
INTRAMUSCULAR | Status: AC
  Filled 2022-09-14: qty 20

## 2022-09-14 MED ORDER — GABAPENTIN 300 MG PO CAPS
300.0000 mg | ORAL_CAPSULE | Freq: Once | ORAL | Status: AC
  Administered 2022-09-14: 300 mg via ORAL
  Filled 2022-09-14: qty 1

## 2022-09-14 MED ORDER — DEXAMETHASONE SODIUM PHOSPHATE 10 MG/ML IJ SOLN
8.0000 mg | Freq: Once | INTRAMUSCULAR | Status: AC
  Administered 2022-09-14: 8 mg via INTRAVENOUS

## 2022-09-14 SURGICAL SUPPLY — 58 items
BAG COUNTER SPONGE SURGICOUNT (BAG) IMPLANT
BAG SPEC THK2 15X12 ZIP CLS (MISCELLANEOUS) ×1
BAG SPNG CNTER NS LX DISP (BAG) ×1
BAG ZIPLOCK 12X15 (MISCELLANEOUS) ×1 IMPLANT
BLADE SAGITTAL 13X1.27X60 (BLADE) ×1 IMPLANT
BLADE SAW SGTL 18X1.27X75 (BLADE) ×1 IMPLANT
BLADE SURG 15 STRL LF DISP TIS (BLADE) ×1 IMPLANT
BLADE SURG 15 STRL SS (BLADE) ×1
BNDG CMPR 5X62 HK CLSR LF (GAUZE/BANDAGES/DRESSINGS) ×1
BNDG CMPR MED 10X6 ELC LF (GAUZE/BANDAGES/DRESSINGS) ×1
BNDG ELASTIC 6INX 5YD STR LF (GAUZE/BANDAGES/DRESSINGS) ×1 IMPLANT
BNDG ELASTIC 6X10 VLCR STRL LF (GAUZE/BANDAGES/DRESSINGS) IMPLANT
BOWL SMART MIX CTS (DISPOSABLE) ×1 IMPLANT
BSPLAT TIB 5D G CMNT STM RT (Knees) ×1 IMPLANT
CEMENT BONE R 1X40 (Cement) ×2 IMPLANT
CLSR STERI-STRIP ANTIMIC 1/2X4 (GAUZE/BANDAGES/DRESSINGS) IMPLANT
COVER SURGICAL LIGHT HANDLE (MISCELLANEOUS) ×1 IMPLANT
CUFF TOURN SGL QUICK 34 (TOURNIQUET CUFF) ×1
CUFF TRNQT CYL 34X4.125X (TOURNIQUET CUFF) ×1 IMPLANT
DRAPE INCISE IOBAN 66X45 STRL (DRAPES) ×2 IMPLANT
DRAPE U-SHAPE 47X51 STRL (DRAPES) ×1 IMPLANT
DRSG AQUACEL AG ADV 3.5X 6 (GAUZE/BANDAGES/DRESSINGS) IMPLANT
DRSG AQUACEL AG ADV 3.5X10 (GAUZE/BANDAGES/DRESSINGS) ×1 IMPLANT
DURAPREP 26ML APPLICATOR (WOUND CARE) ×2 IMPLANT
ELECT REM PT RETURN 15FT ADLT (MISCELLANEOUS) ×1 IMPLANT
FEMUR CMT CCR STD SZ11 R KNEE (Knees) ×1 IMPLANT
FEMUR CMTD CCR STD SZ11 R KNEE (Knees) IMPLANT
GLOVE BIOGEL M 7.0 STRL (GLOVE) IMPLANT
GLOVE BIOGEL PI IND STRL 7.5 (GLOVE) IMPLANT
GLOVE BIOGEL PI IND STRL 8.5 (GLOVE) ×1 IMPLANT
GLOVE SURG ORTHO 8.0 STRL STRW (GLOVE) ×2 IMPLANT
GOWN STRL REUS W/ TWL XL LVL3 (GOWN DISPOSABLE) ×2 IMPLANT
GOWN STRL REUS W/TWL XL LVL3 (GOWN DISPOSABLE) ×2
HANDPIECE INTERPULSE COAX TIP (DISPOSABLE) ×1
HOLDER FOLEY CATH W/STRAP (MISCELLANEOUS) ×1 IMPLANT
HOOD PEEL AWAY T7 (MISCELLANEOUS) ×3 IMPLANT
INSERT TIB KNEE GH/10-12 11 RT (Insert) IMPLANT
KIT TURNOVER KIT A (KITS) IMPLANT
MANIFOLD NEPTUNE II (INSTRUMENTS) ×1 IMPLANT
NS IRRIG 1000ML POUR BTL (IV SOLUTION) ×1 IMPLANT
PACK TOTAL KNEE CUSTOM (KITS) ×1 IMPLANT
PROTECTOR NERVE ULNAR (MISCELLANEOUS) ×1 IMPLANT
SET HNDPC FAN SPRY TIP SCT (DISPOSABLE) ×1 IMPLANT
SPIKE FLUID TRANSFER (MISCELLANEOUS) ×2 IMPLANT
STEM POLY PAT PLY 38M KNEE (Knees) IMPLANT
STEM TIBIA 5 DEG SZ G R KNEE (Knees) IMPLANT
STRIP CLOSURE SKIN 1/2X4 (GAUZE/BANDAGES/DRESSINGS) ×1 IMPLANT
SUT BONE WAX W31G (SUTURE) ×1 IMPLANT
SUT MNCRL AB 3-0 PS2 18 (SUTURE) ×1 IMPLANT
SUT STRATAFIX 0 PDS 27 VIOLET (SUTURE) ×1
SUT STRATAFIX 1PDS 45CM VIOLET (SUTURE) ×1 IMPLANT
SUT VIC AB 1 CT1 36 (SUTURE) ×1 IMPLANT
SUTURE STRATFX 0 PDS 27 VIOLET (SUTURE) ×1 IMPLANT
TIBIA STEM 5 DEG SZ G R KNEE (Knees) ×1 IMPLANT
TRAY FOLEY MTR SLVR 16FR STAT (SET/KITS/TRAYS/PACK) ×1 IMPLANT
TUBE SUCTION HIGH CAP CLEAR NV (SUCTIONS) ×1 IMPLANT
WATER STERILE IRR 1000ML POUR (IV SOLUTION) ×2 IMPLANT
WRAP KNEE MAXI GEL POST OP (GAUZE/BANDAGES/DRESSINGS) ×2 IMPLANT

## 2022-09-14 NOTE — Anesthesia Procedure Notes (Signed)
Anesthesia Procedure Image    

## 2022-09-14 NOTE — Transfer of Care (Signed)
Immediate Anesthesia Transfer of Care Note  Patient: [name]  Procedure(s) Performed: TOTAL KNEE ARTHROPLASTY (Right: Knee)  Patient Location: PACU  Anesthesia Type:MAC combined with regional for post-op pain  Level of Consciousness: awake and patient cooperative  Airway & Oxygen Therapy: Patient Spontanous Breathing  Post-op Assessment: Report given to RN and Post -op Vital signs reviewed and stable  Post vital signs: Reviewed and stable  Last Vitals:  Vitals Value Taken Time  BP    Temp    Pulse 63 09/14/22 1416  Resp 11 09/14/22 1416  SpO2 96 % 09/14/22 1416  Vitals shown include unvalidated device data.  Last Pain:  Vitals:   09/14/22 0936  TempSrc: Oral         Complications: No notable events documented.

## 2022-09-14 NOTE — Evaluation (Signed)
Physical Therapy Evaluation Patient Details Name: [name] MRN: [id number] DOB: [birth year] Today's Date: 09/14/2022  History of Present Illness  62 yo male presents to therapy s/p R TKA on 09/14/2022 due to failure of conservative measures. Pt PMH includes but is not limited to: CAD, B carotid bruitis, hx of tobacco abuse, venous insufficiency of leg, CHF, anemia, kidney stones, lymphocele R groin, HTN, s/p CABG, B  CT release, hepatitis, gastrostomy,  R hip fx s/p IM nail repair (2001),and  L TKA (05/18/2022).  Clinical Impression    [name] is a 62 y.o. male POD 0 s/p R TKA. Patient reports mod I with SPC for mobility at baseline. PT arrived and pt eating dinner and reported 9/10 R LE pain, pain medication provided prior to PT's initial attempt for intervention. PT returned 20 mins later, nurse administered pain medication and pt continues to report 9/10 R LE pain and no change with evaluation. Patient is now limited by functional impairments (see PT problem list below) and requires min guard for bed mobility and min guard and cues for transfers. Patient was able to ambulate 25 feet with RW and min guard level of assist, kyphosis and significant head forward and slow cadence. Patient instructed in exercise to facilitate ROM and circulation to manage edema.  Patient will benefit from continued skilled PT interventions to address impairments and progress towards PLOF. Acute PT will follow to progress mobility and stair training in preparation for safe discharge home with family support and OPPT.      Recommendations for follow up therapy are one component of a multi-disciplinary discharge planning process, led by the attending physician.  Recommendations may be updated based on patient status, additional functional criteria and insurance authorization.  Follow Up Recommendations       Assistance Recommended at Discharge Intermittent Supervision/Assistance  Patient can return  home with the following  A little help with walking and/or transfers;A little help with bathing/dressing/bathroom;Assistance with cooking/housework;Assist for transportation;Help with stairs or ramp for entrance    Equipment Recommendations None recommended by PT (pt reports DME in home setting)  Recommendations for Other Services       Functional Status Assessment Patient has had a recent decline in their functional status and demonstrates the ability to make significant improvements in function in a reasonable and predictable amount of time.     Precautions / Restrictions Precautions Precautions: Knee;Fall Restrictions Weight Bearing Restrictions: No      Mobility  Bed Mobility Overal bed mobility: Needs Assistance Bed Mobility: Supine to Sit     Supine to sit: Min guard     General bed mobility comments: min cues and HOB elevated pt electing to use B UE support to manuver R LE to EOB    Transfers Overall transfer level: Needs assistance Equipment used: Rolling walker (2 wheels) Transfers: Sit to/from Stand Sit to Stand: Min guard           General transfer comment: cues for proper UE placement, pt slow to power up    Ambulation/Gait Ambulation/Gait assistance: Min guard Gait Distance (Feet): 25 Feet Assistive device: Rolling walker (2 wheels) Gait Pattern/deviations: Step-to pattern, Antalgic, Wide base of support Gait velocity: decreased        Stairs            Wheelchair Mobility    Modified Rankin (Stroke Patients Only)       Balance Overall balance assessment: Needs assistance Sitting-balance support: Feet supported Sitting balance-Leahy Scale:  Good     Standing balance support: Bilateral upper extremity supported, During functional activity, Reliant on assistive device for balance Standing balance-Leahy Scale: Poor                               Pertinent Vitals/Pain Pain Assessment Pain Assessment: 0-10 Pain Score:  9  Pain Location: R knee and leg Pain Descriptors / Indicators: Aching, Constant, Crushing, Discomfort, Operative site guarding Pain Intervention(s): Limited activity within patient's tolerance, Monitored during session, Premedicated before session, Patient requesting pain meds-RN notified, Repositioned, Ice applied    Home Living Family/patient expects to be discharged to:: Private residence Living Arrangements: Parent Available Help at Discharge: Family Type of Home: Mobile home Home Access: Ramped entrance       Home Layout: One level Home Equipment: Cane - single Librarian, academic (2 wheels) Additional Comments: pt reports living with his dad, pt indicated pt requires occational assist for fall recovery    Prior Function Prior Level of Function : Independent/Modified Independent             Mobility Comments: SPC for functional mobility tasks and mod I for all ADLs, self care tasks IADLs       Hand Dominance        Extremity/Trunk Assessment        Lower Extremity Assessment Lower Extremity Assessment: RLE deficits/detail RLE Deficits / Details: ankle Df/PF 5/5; SLR AA pt reports being fearful of moving RLE Sensation: WNL    Cervical / Trunk Assessment Cervical / Trunk Assessment: Kyphotic (head forward)  Communication   Communication: No difficulties  Cognition Arousal/Alertness: Awake/alert Behavior During Therapy: WFL for tasks assessed/performed Overall Cognitive Status: Within Functional Limits for tasks assessed                                          General Comments      Exercises Total Joint Exercises Ankle Circles/Pumps: AROM, Both, 20 reps   Assessment/Plan    PT Assessment Patient needs continued PT services  PT Problem List Decreased strength;Decreased range of motion;Decreased activity tolerance;Decreased mobility;Decreased balance;Decreased coordination;Pain       PT Treatment Interventions DME  instruction;Gait training;Functional mobility training;Therapeutic exercise;Therapeutic activities;Balance training;Neuromuscular re-education;Cognitive remediation;Patient/family education;Modalities    PT Goals (Current goals can be found in the Care Plan section)  Acute Rehab PT Goals Patient Stated Goal: to be able to walk up to the alter at church and kneel PT Goal Formulation: With patient Time For Goal Achievement: 09/28/22 Potential to Achieve Goals: Good    Frequency 7X/week     Co-evaluation               AM-PAC PT "6 Clicks" Mobility  Outcome Measure Help needed turning from your back to your side while in a flat bed without using bedrails?: A Little Help needed moving from lying on your back to sitting on the side of a flat bed without using bedrails?: A Little Help needed moving to and from a bed to a chair (including a wheelchair)?: A Little Help needed standing up from a chair using your arms (e.g., wheelchair or bedside chair)?: A Little Help needed to walk in hospital room?: A Little Help needed climbing 3-5 steps with a railing? : A Lot 6 Click Score: 17    End of Session Equipment Utilized During Treatment:  Gait belt Activity Tolerance: Patient limited by pain;Patient limited by fatigue (pt reported 9/10 pain t/o intervention) Patient left: in chair;with call bell/phone within reach Nurse Communication: Mobility status;Patient requests pain meds PT Visit Diagnosis: Unsteadiness on feet (R26.81);Other abnormalities of gait and mobility (R26.89);Difficulty in walking, not elsewhere classified (R26.2);Pain Pain - Right/Left: Right Pain - part of body: Knee;Leg    Time: 1610-9604 PT Time Calculation (min) (ACUTE ONLY): 28 min   Charges:   PT Evaluation $PT Eval Low Complexity: 1 Low PT Treatments $Gait Training: 8-22 mins        Johnny Bridge, PT Acute Rehab   Jacqualyn Posey 09/14/2022, 7:07 PM

## 2022-09-14 NOTE — Anesthesia Procedure Notes (Signed)
Procedure Name: MAC Date/Time: 09/14/2022 12:01 PM  Performed by: Vanessa Louin, CRNAPre-anesthesia Checklist: Patient identified, Emergency Drugs available, Suction available and Patient being monitored Patient Re-evaluated:Patient Re-evaluated prior to induction Oxygen Delivery Method: Simple face mask

## 2022-09-14 NOTE — Progress Notes (Signed)
Orthopedic Tech Progress Note Patient Details:  [name] August 03, 1960 [id number]  CPM Left Knee CPM Left Knee: On Left Knee Flexion (Degrees): 0 Left Knee Extension (Degrees): 90    Ortho Devices Type of Ortho Device: Bone foam zero knee Ortho Device/Splint Interventions: Floria Raveling 09/14/2022, 3:45 PM

## 2022-09-14 NOTE — H&P (Signed)
[name] MRN:  [id number] DOB/SEX:  03-08-1961/male  CHIEF COMPLAINT:  Painful right Knee  HISTORY: Patient is a 62 y.o. male presented with a history of pain in the right knee. Onset of symptoms was gradual starting a few years ago with gradually worsening course since that time. Patient has been treated conservatively with over-the-counter NSAIDs and activity modification. Patient currently rates pain in the knee at 10 out of 10 with activity. There is pain at night.  PAST MEDICAL HISTORY: Patient Active Problem List   Diagnosis Date Noted   S/P total knee replacement 05/18/2022   Preop cardiovascular exam 12/23/2021   Bilateral primary osteoarthritis of knee 10/22/2021   Lymphedema of leg 06/15/2018   Coronary artery disease coronary CT angio showing numerous 0 to 25% stenosis, tightest stenosis obtuse marginal branch 25 to 50% 06/15/2018   Bilateral carotid bruits 08/08/2015   Smoking 08/08/2015   Venous insufficiency of leg 08/28/2014   Bacteremia 08/13/2014   Venous stasis dermatitis    Past Medical History:  Diagnosis Date   Anemia    Arthritis    CAD (coronary artery disease)    CHF (congestive heart failure) (HCC)    PT. DENIES AT PREOP   Heart murmur    hx of small murmur    Hepatitis    Hepatitis C not treated for Hx of   History of kidney stones    Hypertension    Lymphocele    Right groin   Morbid obesity (HCC)    Venous insufficiency of leg    Venous stasis dermatitis    Past Surgical History:  Procedure Laterality Date   CARPAL TUNNEL RELEASE     bilateral    CYSTOSCOPY W/ URETERAL STENT PLACEMENT Bilateral 04/21/2016   Procedure: CYSTOSCOPY WITH RETROGRADE PYELOGRAM/URETERAL STENT PLACEMENT;  Surgeon: Hildred Laser, MD;  Location: [facility];  Service: Urology;  Laterality: Bilateral;   CYSTOSCOPY/URETEROSCOPY/HOLMIUM LASER/STENT PLACEMENT Bilateral 04/01/2016   Procedure: CYSTOSCOPY WITH RETROGRADE AND  STENT PLACEMENT;  Surgeon: Hildred Laser, MD;  Location: [facility];  Service: Urology;  Laterality: Bilateral;   CYSTOSCOPY/URETEROSCOPY/HOLMIUM LASER/STENT PLACEMENT Bilateral 04/21/2016   Procedure: CYSTOSCOPY/URETEROSCOPY/HOLMIUM LASER/STENT REPLACEMENT;  Surgeon: Hildred Laser, MD;  Location: [facility];  Service: Urology;  Laterality: Bilateral;   EYE SURGERY     bil cataracts   gastric bypass surgery   10/2015   GASTROPLASTY DUODENAL SWITCH  10/17/2015   HERNIA REPAIR     Hiatal hernia repair done with Gastric Bypass   HIP FRACTURE SURGERY Right 10/17/2019   Using Gamma Nail performed by Dr. Loralie Champagne   HOLMIUM LASER APPLICATION Bilateral 04/21/2016   Procedure: HOLMIUM LASER APPLICATION;  Surgeon: Hildred Laser, MD;  Location: [facility];  Service: Urology;  Laterality: Bilateral;   IR URETERAL STENT LEFT NEW ACCESS W/O SEP NEPHROSTOMY CATH  05/17/2018   LYMPH NODE BIOPSY Right 01/29/2017   Inguinal, performed by Dr. Georgiana Shore   NEPHROLITHOTOMY Left 05/17/2018   Procedure: NEPHROLITHOTOMY PERCUTANEOUS;  Surgeon: Bjorn Pippin, MD;  Location: [facility];  Service: Urology;  Laterality: Left;   TONSILLECTOMY     TOTAL KNEE ARTHROPLASTY Left 05/18/2022   Procedure: TOTAL KNEE ARTHROPLASTY;  Surgeon: Dannielle Huh, MD;  Location: [facility];  Service: Orthopedics;  Laterality: Left;     MEDICATIONS:   No medications prior to admission.    ALLERGIES:  No Known Allergies  REVIEW OF SYSTEMS:  A comprehensive review of systems was negative except for: Musculoskeletal: positive for arthralgias and bone  pain   FAMILY HISTORY:   Family History  Problem Relation Age of Onset   Leukemia Mother        passed away 10/04/2008   Diabetes Father    Heart disease Father    Hypertension Father    Colon cancer Father     SOCIAL HISTORY:   Social History   Tobacco Use   Smoking status: Former    Packs/day: 0.50    Years: 35.00    Additional pack years: 0.00    Total pack years: 17.50    Types: Cigarettes    Quit date:  05/17/2015    Years since quitting: 7.3   Smokeless tobacco: Never  Substance Use Topics   Alcohol use: No     EXAMINATION:  Vital signs in last 24 hours:    There were no vitals taken for this visit.  General Appearance:    Alert, cooperative, no distress, appears stated age  Head:    Normocephalic, without obvious abnormality, atraumatic  Eyes:    PERRL, conjunctiva/corneas clear, EOM's intact, fundi    benign, both eyes       Ears:    Normal TM's and external ear canals, both ears  Nose:   Nares normal, septum midline, mucosa normal, no drainage    or sinus tenderness  Throat:   Lips, mucosa, and tongue normal; teeth and gums normal  Neck:   Supple, symmetrical, trachea midline, no adenopathy;       thyroid:  No enlargement/tenderness/nodules; no carotid   bruit or JVD  Back:     Symmetric, no curvature, ROM normal, no CVA tenderness  Lungs:     Clear to auscultation bilaterally, respirations unlabored  Chest wall:    No tenderness or deformity  Heart:    Regular rate and rhythm, S1 and S2 normal, no murmur, rub   or gallop  Abdomen:     Soft, non-tender, bowel sounds active all four quadrants,    no masses, no organomegaly  Genitalia:    Normal male without lesion, discharge or tenderness  Rectal:    Normal tone, normal prostate, no masses or tenderness;   guaiac negative stool  Extremities:   Extremities normal, atraumatic, no cyanosis or edema  Pulses:   2+ and symmetric all extremities  Skin:   Skin color, texture, turgor normal, no rashes or lesions  Lymph nodes:   Cervical, supraclavicular, and axillary nodes normal  Neurologic:   CNII-XII intact. Normal strength, sensation and reflexes      throughout    Musculoskeletal:  ROM -10-100, Ligaments intact,  Imaging Review Plain radiographs demonstrate severe degenerative joint disease of the right knee. The overall alignment is neutral. The bone quality appears to be good for age and reported activity  level.  Assessment/Plan: Primary osteoarthritis, right knee   The patient history, physical examination and imaging studies are consistent with advanced degenerative joint disease of the right knee. The patient has failed conservative treatment.  The clearance notes were reviewed.  After discussion with the patient it was felt that Total Knee Replacement was indicated. The procedure,  risks, and benefits of total knee arthroplasty were presented and reviewed. The risks including but not limited to aseptic loosening, infection, blood clots, vascular injury, stiffness, patella tracking problems complications among others were discussed. The patient acknowledged the explanation, agreed to proceed with the plan.  Preoperative templating of the joint replacement has been completed, documented, and submitted to the Operating Room personnel in order to optimize intra-operative  equipment management.    Patient's anticipated LOS is less than 2 midnights, meeting these requirements: - Younger than 62 - Lives within 1 hour of care - Has a competent adult at home to recover with post-op recover - NO history of  - Chronic pain requiring opiods  - Diabetes  - Coronary Artery Disease  - Heart failure  - Heart attack  - Stroke  - DVT/VTE  - Cardiac arrhythmia  - Respiratory Failure/COPD  - Renal failure  - Anemia  - Advanced Liver disease     Guy Sandifer 09/14/2022, 8:58 AM

## 2022-09-14 NOTE — Anesthesia Procedure Notes (Signed)
Anesthesia Regional Block: Adductor canal block   Pre-Anesthetic Checklist: , timeout performed,  Correct Patient, Correct Site, Correct Laterality,  Correct Procedure, Correct Position, site marked,  Risks and benefits discussed,  Surgical consent,  Pre-op evaluation,  At surgeon's request and post-op pain management  Laterality: Right  Prep: chloraprep       Needles:  Injection technique: Single-shot  Needle Type: Echogenic Needle     Needle Length: 9cm      Additional Needles:   Procedures:,,,, ultrasound used (permanent image in chart),,    Narrative:  Start time: 09/14/2022 11:30 AM End time: 09/14/2022 11:36 AM Injection made incrementally with aspirations every 5 mL.  Performed by: Personally  Anesthesiologist: Eilene Ghazi, MD  Additional Notes: Patient tolerated the procedure well without complications

## 2022-09-14 NOTE — Anesthesia Preprocedure Evaluation (Signed)
Anesthesia Evaluation  Patient identified by MRN, date of birth, ID band Patient awake    Reviewed: Allergy & Precautions, H&P , NPO status , Patient's Chart, lab work & pertinent test results  Airway Mallampati: II  TM Distance: >3 FB Neck ROM: Full    Dental no notable dental hx.    Pulmonary neg pulmonary ROS, former smoker   Pulmonary exam normal breath sounds clear to auscultation       Cardiovascular hypertension, Normal cardiovascular exam Rhythm:Regular Rate:Normal     Neuro/Psych negative neurological ROS  negative psych ROS   GI/Hepatic negative GI ROS, Neg liver ROS,,,  Endo/Other  negative endocrine ROS    Renal/GU negative Renal ROS  negative genitourinary   Musculoskeletal  (+) Arthritis , Osteoarthritis,    Abdominal   Peds negative pediatric ROS (+)  Hematology  (+) Blood dyscrasia, anemia   Anesthesia Other Findings   Reproductive/Obstetrics negative OB ROS                             Anesthesia Physical Anesthesia Plan  ASA: 3  Anesthesia Plan: Spinal   Post-op Pain Management: Regional block*   Induction: Intravenous  PONV Risk Score and Plan: 1 and Propofol infusion and Treatment may vary due to age or medical condition  Airway Management Planned: Simple Face Mask  Additional Equipment:   Intra-op Plan:   Post-operative Plan:   Informed Consent: I have reviewed the patients History and Physical, chart, labs and discussed the procedure including the risks, benefits and alternatives for the proposed anesthesia with the patient or authorized representative who has indicated his/her understanding and acceptance.     Dental advisory given  Plan Discussed with: CRNA and Surgeon  Anesthesia Plan Comments:        Anesthesia Quick Evaluation

## 2022-09-14 NOTE — Op Note (Signed)
TOTAL KNEE REPLACEMENT OPERATIVE NOTE:  09/14/2022  2:30 PM  PATIENT:  [name]  62 y.o. male  PRE-OPERATIVE DIAGNOSIS:  Osteoarthritis right knee  M17.11  POST-OPERATIVE DIAGNOSIS:  Osteoarthritis right knee M17.11  PROCEDURE:  Procedure(s): TOTAL KNEE ARTHROPLASTY  SURGEON:  Surgeon(s): Dannielle Huh, MD  PHYSICIAN ASSISTANT: Laurier Nancy, PA-C   ANESTHESIA:   spinal  SPECIMEN: None  COUNTS:  Correct  TOURNIQUET:   Total Tourniquet Time Documented: Thigh (Right) - 81 minutes Total: Thigh (Right) - 81 minutes   DICTATION:  Indication for procedure:    The patient is a 62 y.o. male who has failed conservative treatment for Osteoarthritis right knee  M17.11.  Informed consent was obtained prior to anesthesia. The risks versus benefits of the operation were explain and in a way the patient can, and did, understand.     Description of procedure:     The patient was taken to the operating room and placed under anesthesia.  The patient was positioned in the usual fashion taking care that all body parts were adequately padded and/or protected.  A tourniquet was applied and the leg prepped and draped in the usual sterile fashion.  The extremity was exsanguinated with the esmarch and tourniquet inflated to 300 mmHg.  Pre-operative range of motion was -10-45.  The knee was in 5 degree of mild varus.  A midline incision approximately 6-7 inches long was made with a #10 blade.  A new blade was used to make a parapatellar arthrotomy going 2-3 cm into the quadriceps tendon, over the patella, and alongside the medial aspect of the patellar tendon.  A synovectomy was then performed with the #10 blade and forceps. I then elevated the deep MCL off the medial tibial metaphysis subperiosteally around to the semimembranosus attachment.    I everted the patella and used calipers to measure patellar thickness.  I used the reamer to ream down to appropriate thickness to recreate the  native thickness.  I then removed excess bone with the rongeur and sagittal saw.  I used the appropriately sized template and drilled the three lug holes.  I then put the trial in place and measured the thickness with the calipers to ensure recreation of the native thickness.  The trial was then removed and the patella subluxed and the knee brought into flexion.  A homan retractor was place to retract and protect the patella and lateral structures.  A Z-retractor was place medially to protect the medial structures.  The extra-medullary alignment system was used to make cut the tibial articular surface perpendicular to the anamotic axis of the tibia and in 3 degrees of posterior slope.  The cut surface and alignment jig was removed.  I then used the intramedullary alignment guide to make a 5 valgus cut on the distal femur.  I then marked out the epicondylar axis on the distal femur.    I then used the anterior referencing sizer and measured the femur to be a size  11 .  The 4-In-1 cutting block was screwed into place in external rotation matching the posterior condylar angle, making our cuts perpendicular to the epicondylar axis.  Anterior, posterior and chamfer cuts were made with the sagittal saw.  The cutting block and cut pieces were removed.  A lamina spreader was placed in 90 degrees of flexion.  The ACL, PCL, menisci, and posterior condylar osteophytes were removed.  A 11 mm spacer blocked was found to offer good flexion and extension gap  balance after minimal in degree releasing.   The scoop retractor was then placed and the femoral finishing block was pinned in place.  The small sagittal saw was used as well as the lug drill to finish the femur.  The block and cut surfaces were removed and the medullary canal hole filled with autograft bone from the cut pieces.  The tibia was delivered forward in deep flexion and external rotation.  A size G tray was selected and pinned into place centered on the  medial 1/3 of the tibial tubercle.  The reamer and keel was used to prepare the tibia through the tray.    I then trialed with the size  11  femur, size G tibia, a 11 mm insert and the 38 patella.  I had excellent flexion/extension gap balance, excellent patella tracking.  Flexion was full and beyond 120 degrees; extension was zero.  These components were chosen and the staff opened them to me on the back table while the knee was lavaged copiously and the cement mixed.  The soft tissue was infiltrated with 60cc of exparel 1.3% through a 21 gauge needle.  I cemented in the components and removed all excess cement.  The polyethylene tibial component was snapped into place and the knee placed in extension while cement was hardening.  The capsule was infilltrated with a 60cc exparel/marcaine/saline mixture.   Once the cement was hard, the tourniquet was let down.  Hemostasis was obtained.  The arthrotomy was closed using a #1 stratofix running suture.  The deep soft tissues were closed with #0 vicryls and the subcuticular layer closed with #2-0 vicryl.  The skin was reapproximated and closed with 3.0 Monocryl.  The wound was covered with steristrips, aquacel dressing, and a TED stocking.   The patient was then awakened, extubated, and taken to the recovery room in stable condition.  BLOOD LOSS:  300cc COMPLICATIONS:  None.  PLAN OF CARE: Admit for overnight observation  PATIENT DISPOSITION:  PACU - hemodynamically stable.   Delay start of Pharmacological VTE agent (>24hrs) due to surgical blood loss or risk of bleeding:  yes  Please fax a copy of this op note to my office at [phone] (please only include page 1 and 2 of the Case Information op note)

## 2022-09-14 NOTE — Anesthesia Procedure Notes (Signed)
Spinal  Patient location during procedure: OR Start time: 09/14/2022 11:50 AM End time: 09/14/2022 11:54 AM Reason for block: surgical anesthesia Staffing Performed: anesthesiologist  Anesthesiologist: Eilene Ghazi, MD Performed by: Eilene Ghazi, MD Authorized by: Eilene Ghazi, MD   Preanesthetic Checklist Completed: patient identified, IV checked, site marked, risks and benefits discussed, surgical consent, monitors and equipment checked, pre-op evaluation and timeout performed Spinal Block Patient position: sitting Prep: Betadine Patient monitoring: heart rate, continuous pulse ox and blood pressure Approach: midline Location: L3-4 Injection technique: single-shot Needle Needle type: Sprotte  Needle gauge: 24 G Needle length: 9 cm Assessment Sensory level: T6 Events: CSF return Additional Notes

## 2022-09-15 DIAGNOSIS — Z79899 Other long term (current) drug therapy: Secondary | ICD-10-CM | POA: Diagnosis not present

## 2022-09-15 DIAGNOSIS — I2510 Atherosclerotic heart disease of native coronary artery without angina pectoris: Secondary | ICD-10-CM | POA: Diagnosis not present

## 2022-09-15 DIAGNOSIS — Z87891 Personal history of nicotine dependence: Secondary | ICD-10-CM | POA: Diagnosis not present

## 2022-09-15 DIAGNOSIS — M1711 Unilateral primary osteoarthritis, right knee: Secondary | ICD-10-CM | POA: Diagnosis not present

## 2022-09-15 DIAGNOSIS — I110 Hypertensive heart disease with heart failure: Secondary | ICD-10-CM | POA: Diagnosis not present

## 2022-09-15 DIAGNOSIS — Z96652 Presence of left artificial knee joint: Secondary | ICD-10-CM | POA: Diagnosis not present

## 2022-09-15 DIAGNOSIS — I509 Heart failure, unspecified: Secondary | ICD-10-CM | POA: Diagnosis not present

## 2022-09-15 MED ORDER — HYDROMORPHONE HCL 2 MG PO TABS: 2.0000 mg | ORAL_TABLET | Freq: Four times a day (QID) | ORAL | 0 refills | Status: AC | PRN

## 2022-09-15 MED ORDER — METHOCARBAMOL 1000 MG PO TABS: 1000.0000 mg | ORAL_TABLET | Freq: Four times a day (QID) | ORAL | 0 refills | Status: AC | PRN

## 2022-09-15 MED ORDER — ASPIRIN 81 MG PO CHEW: 81.0000 mg | CHEWABLE_TABLET | Freq: Two times a day (BID) | ORAL | 0 refills | Status: AC

## 2022-09-15 NOTE — Plan of Care (Signed)
  Problem: Activity: Goal: Ability to avoid complications of mobility impairment will improve Outcome: Progressing Goal: Range of joint motion will improve Outcome: Progressing   Problem: Safety: Goal: Ability to remain free from injury will improve Outcome: Progressing   

## 2022-09-15 NOTE — Anesthesia Postprocedure Evaluation (Signed)
Anesthesia Post Note  Patient: [name]  Procedure(s) Performed: TOTAL KNEE ARTHROPLASTY (Right: Knee)     Patient location during evaluation: PACU Anesthesia Type: Spinal Level of consciousness: oriented and awake and alert Pain management: pain level controlled Vital Signs Assessment: post-procedure vital signs reviewed and stable Respiratory status: spontaneous breathing, respiratory function stable and patient connected to nasal cannula oxygen Cardiovascular status: blood pressure returned to baseline and stable Postop Assessment: no headache, no backache and no apparent nausea or vomiting Anesthetic complications: no  No notable events documented.  Last Vitals:  Vitals:   09/15/22 0208 09/15/22 0514  BP: 104/61 107/66  Pulse: 61 (!) 55  Resp: 18 17  Temp: 36.8 C 36.6 C  SpO2: 98% 98%    Last Pain:  Vitals:   09/15/22 0600  TempSrc:   PainSc: Asleep                 Zaydan Papesh S

## 2022-09-15 NOTE — Progress Notes (Signed)
SPORTS MEDICINE AND JOINT REPLACEMENT  Georgena Spurling, MD    Laurier Nancy, PA-C [address] [place], [place], [place]  [address]                             [phone]   PROGRESS NOTE  Subjective:  negative for Chest Pain  negative for Shortness of Breath  negative for Nausea/Vomiting   negative for Calf Pain  negative for Bowel Movement   Tolerating Diet: yes         Patient reports pain as 5 on 0-10 scale.    Objective: Vital signs in last 24 hours:   Patient Vitals for the past 24 hrs:  BP Temp Temp src Pulse Resp SpO2  09/15/22 1341 102/62 98.1 F (36.7 C) Oral (!) 49 17 100 %  09/15/22 0937 (!) 82/55 98.1 F (36.7 C) Oral -- 16 100 %  09/15/22 0514 107/66 97.9 F (36.6 C) Oral (!) 55 17 98 %  09/15/22 0208 104/61 98.2 F (36.8 C) Oral 61 18 98 %  09/14/22 2025 (!) 110/59 (!) 97.4 F (36.3 C) Oral -- 18 99 %  09/14/22 1816 110/73 97.7 F (36.5 C) Oral 69 17 96 %    @flow {1959:LAST@   Intake/Output from previous day:   06/03 0701 - 06/04 0700 In: 2732.6 [P.O.:1400; I.V.:932.6] Out: 2855 [Urine:2755]   Intake/Output this shift:   No intake/output data recorded.   Intake/Output      06/03 0701 06/04 0700 06/04 0701 06/05 0700   P.O. 1400    I.V. (mL/kg) 932.6 (9.9)    IV Piggyback 400    Total Intake(mL/kg) 2732.6 (29)    Urine (mL/kg/hr) 2755    Blood 100    Total Output 2855    Net -122.4            LABORATORY DATA: Recent Labs    09/10/22 1401  WBC 4.5  HGB 10.8*  HCT 33.2*  PLT 272   Recent Labs    09/10/22 1401  NA 137  K 3.5  CL 102  CO2 27  BUN 17  CREATININE 0.71  GLUCOSE 77  CALCIUM 8.6*   Lab Results  Component Value Date   INR 0.95 05/17/2018   INR 1.03 08/07/2015   INR 1.20 08/12/2014    Examination:  General appearance: alert, cooperative, and no distress Extremities: extremities normal, atraumatic, no cyanosis or edema  Wound Exam: clean, dry, intact   Drainage:  None: wound tissue dry  Motor Exam:  Quadriceps and Hamstrings Intact  Sensory Exam: Superficial Peroneal, Deep Peroneal, and Tibial normal   Assessment:    1 Day Post-Op  Procedure(s) (LRB): TOTAL KNEE ARTHROPLASTY (Right)  ADDITIONAL DIAGNOSIS:  Principal Problem:   S/P total knee replacement     Plan: Physical Therapy as ordered Weight Bearing as Tolerated (WBAT)  DVT Prophylaxis:  Aspirin  DISCHARGE PLAN: Home  Patient doing well and passed PT. D/C home today       Patient's anticipated LOS is less than 2 midnights, meeting these requirements: - Younger than 67 - Lives within 1 hour of care - Has a competent adult at home to recover with post-op recover - NO history of  - Chronic pain requiring opiods  - Diabetes  - Coronary Artery Disease  - Heart failure  - Heart attack  - Stroke  - DVT/VTE  - Cardiac arrhythmia  - Respiratory Failure/COPD  - Renal failure  -  Anemia  - Advanced Liver disease      Guy Sandifer 09/15/2022, 5:00 PM

## 2022-09-15 NOTE — TOC Transition Note (Signed)
Transition of Care [facility]) - CM/SW Discharge Note  Patient Details  Name: Taiquan Fronda. MRN: [id number] Date of Birth: 09-06-1960  [facility]) CM/SW Contact:  Ewing Schlein, LCSW Phone Number: 09/15/2022, 10:03 AM  Clinical Narrative: Patient is expected to discharge home after working with PT. CSW met with patient to confirm discharge plan. Patient will go home with OPPT at [facility]. Patient has a rolling walker at home, so there are no DME needs at this time. TOC signing off.  Final next level of care: OP Rehab Barriers to Discharge: No Barriers Identified  Patient Goals and CMS Choice Choice offered to / list presented to : NA  Discharge Plan and Services Additional resources added to the After Visit Summary for        DME Arranged: N/A DME Agency: NA  Social Determinants of Health (SDOH) Interventions SDOH Screenings   Food Insecurity: No Food Insecurity (09/14/2022)  Housing: Low Risk  (09/14/2022)  Transportation Needs: No Transportation Needs (09/14/2022)  Utilities: Not At Risk (09/14/2022)  Tobacco Use: Medium Risk (09/14/2022)   Readmission Risk Interventions     No data to display

## 2022-09-15 NOTE — Progress Notes (Signed)
Discharge package printed and instructions given to pt. Patient verbalizes understanding. 

## 2022-09-15 NOTE — Progress Notes (Signed)
Physical Therapy Treatment Patient Details Name: [name] MRN: [id number] DOB: 03/18/1961 Today's Date: 09/15/2022   History of Present Illness 62 yo male presents to therapy s/p R TKA on 09/14/2022 due to failure of conservative measures. Pt PMH includes but is not limited to: CAD, B carotid bruitis, hx of tobacco abuse, venous insufficiency of leg, CHF, anemia, kidney stones, lymphocele R groin, HTN, s/p CABG, B  CT release, hepatitis, gastrostomy,  R hip fx s/p IM nail repair (2001),and  L TKA (05/18/2022).    PT Comments    POD # 1 am session Pt AxO x 3 motivated.  Had his "other" knee replaced Feb 2024. Assisted OOB to amb in hallway went well.  Then returned to room to perform some TE's following HEP handout.  Instructed on proper tech, freq as well as use of ICE.    Will see pt again this afternoon then expect D/C to home.   Recommendations for follow up therapy are one component of a multi-disciplinary discharge planning process, led by the attending physician.  Recommendations may be updated based on patient status, additional functional criteria and insurance authorization.  Follow Up Recommendations       Assistance Recommended at Discharge Intermittent Supervision/Assistance  Patient can return home with the following A little help with walking and/or transfers;A little help with bathing/dressing/bathroom;Assistance with cooking/housework;Assist for transportation;Help with stairs or ramp for entrance   Equipment Recommendations  None recommended by PT    Recommendations for Other Services       Precautions / Restrictions Precautions Precautions: Knee;Fall Precaution Comments: no pillow under knee Restrictions Weight Bearing Restrictions: No Other Position/Activity Restrictions: WBAT     Mobility  Bed Mobility Overal bed mobility: Needs Assistance Bed Mobility: Supine to Sit     Supine to sit: Supervision, Min guard     General bed mobility  comments: self able using strap    Transfers Overall transfer level: Needs assistance Equipment used: Rolling walker (2 wheels) Transfers: Sit to/from Stand Sit to Stand: Supervision, Min guard           General transfer comment: slightly impulsive.  VC's on safety    Ambulation/Gait Ambulation/Gait assistance: Supervision, Min guard Gait Distance (Feet): 55 Feet Assistive device: Rolling walker (2 wheels) Gait Pattern/deviations: Step-to pattern, Antalgic, Wide base of support Gait velocity: decreased     General Gait Details: tolerated a functional distance.  Poor forward flexed posture (chronic)   Stairs             Wheelchair Mobility    Modified Rankin (Stroke Patients Only)       Balance                                            Cognition Arousal/Alertness: Awake/alert Behavior During Therapy: WFL for tasks assessed/performed Overall Cognitive Status: Within Functional Limits for tasks assessed                                 General Comments: AxO x 3 motivated.  Had his "other" knee replaced Feb 2024.        Exercises      General Comments        Pertinent Vitals/Pain Pain Assessment Pain Assessment: 0-10 Pain Score: 7  Pain Location: R knee and leg Pain Descriptors /  Indicators: Constant, Crushing, Discomfort, Operative site guarding Pain Intervention(s): Monitored during session, Premedicated before session, Repositioned, Ice applied    Home Living                          Prior Function            PT Goals (current goals can now be found in the care plan section) Progress towards PT goals: Progressing toward goals    Frequency    7X/week      PT Plan Current plan remains appropriate    Co-evaluation              AM-PAC PT "6 Clicks" Mobility   Outcome Measure  Help needed turning from your back to your side while in a flat bed without using bedrails?: A  Little Help needed moving from lying on your back to sitting on the side of a flat bed without using bedrails?: A Little Help needed moving to and from a bed to a chair (including a wheelchair)?: A Little Help needed standing up from a chair using your arms (e.g., wheelchair or bedside chair)?: A Little Help needed to walk in hospital room?: A Little Help needed climbing 3-5 steps with a railing? : A Little 6 Click Score: 18    End of Session Equipment Utilized During Treatment: Gait belt Activity Tolerance: Patient tolerated treatment well Patient left: in chair;with call bell/phone within reach Nurse Communication: Mobility status;Patient requests pain meds PT Visit Diagnosis: Unsteadiness on feet (R26.81);Other abnormalities of gait and mobility (R26.89);Difficulty in walking, not elsewhere classified (R26.2);Pain Pain - Right/Left: Right Pain - part of body: Knee;Leg     Time: 1000-1030 PT Time Calculation (min) (ACUTE ONLY): 30 min  Charges:  $Gait Training: 8-22 mins $Therapeutic Exercise: 8-22 mins                     {Marzetta Lanza  PTA Acute  [employer] M-F          [phone]

## 2022-09-15 NOTE — Discharge Summary (Signed)
SPORTS MEDICINE & JOINT REPLACEMENT   Georgena Spurling, MD   Laurier Nancy, PA-C [address] [place], [place], [place]  [address]                             [phone]  PATIENT ID: [name]        MRN:  [id number]          DOB/AGE: [birth year] / 62 y.o.    DISCHARGE SUMMARY  ADMISSION DATE:    09/14/2022 DISCHARGE DATE:   09/15/2022   ADMISSION DIAGNOSIS: S/P total knee replacement [Z96.659]    DISCHARGE DIAGNOSIS:  Osteoarthritis right knee  M17.11    ADDITIONAL DIAGNOSIS: Principal Problem:   S/P total knee replacement  Past Medical History:  Diagnosis Date   Anemia    Arthritis    CAD (coronary artery disease)    CHF (congestive heart failure) (HCC)    PT. DENIES AT PREOP   Heart murmur    hx of small murmur    Hepatitis    Hepatitis C not treated for Hx of   History of kidney stones    Hypertension    Lymphocele    Right groin   Morbid obesity (HCC)    Venous insufficiency of leg    Venous stasis dermatitis     PROCEDURE: Procedure(s): TOTAL KNEE ARTHROPLASTY on 09/14/2022  CONSULTS:    HISTORY:  See H&P in chart  HOSPITAL COURSE:  [name] is a 62 y.o. admitted on 09/14/2022 and found to have a diagnosis of Osteoarthritis right knee  M17.11.  After appropriate laboratory studies were obtained  they were taken to the operating room on 09/14/2022 and underwent Procedure(s): TOTAL KNEE ARTHROPLASTY.   They were given perioperative antibiotics:  Anti-infectives (From admission, onward)    Start     Dose/Rate Route Frequency Ordered Stop   09/14/22 1800  ceFAZolin (ANCEF) IVPB 2g/100 mL premix        2 g 200 mL/hr over 30 Minutes Intravenous Every 6 hours 09/14/22 1556 09/14/22 2344   09/14/22 0915  ceFAZolin (ANCEF) IVPB 2g/100 mL premix        2 g 200 mL/hr over 30 Minutes Intravenous On call to O.R. 09/14/22 6962 09/14/22 1208     .  Patient given tranexamic acid IV or topical and exparel intra-operatively.  Tolerated the procedure  well.    POD# 1: Vital signs were stable.  Patient denied Chest pain, shortness of breath, or calf pain.  Patient was started on Aspirin twice daily at 8am.  Consults to PT, OT, and care management were made.  The patient was weight bearing as tolerated.  CPM was placed on the operative leg 0-90 degrees for 6-8 hours a day. When out of the CPM, patient was placed in the foam block to achieve full extension. Incentive spirometry was taught.  Dressing was changed.       POD #2, Continued  PT for ambulation and exercise program.  IV saline locked.  O2 discontinued.    The remainder of the hospital course was dedicated to ambulation and strengthening.   The patient was discharged on 1 Day Post-Op in  Good condition.  Blood products given:none  DIAGNOSTIC STUDIES: Recent vital signs: Patient Vitals for the past 24 hrs:  BP Temp Temp src Pulse Resp SpO2  09/15/22 1341 102/62 98.1 F (36.7 C) Oral (!) 49 17 100 %  09/15/22 0937 Marland Kitchen)  82/55 98.1 F (36.7 C) Oral -- 16 100 %  09/15/22 0514 107/66 97.9 F (36.6 C) Oral (!) 55 17 98 %  09/15/22 0208 104/61 98.2 F (36.8 C) Oral 61 18 98 %  09/14/22 2025 (!) 110/59 (!) 97.4 F (36.3 C) Oral -- 18 99 %  09/14/22 1816 110/73 97.7 F (36.5 C) Oral 69 17 96 %       Recent laboratory studies: Recent Labs    09/10/22 1401  WBC 4.5  HGB 10.8*  HCT 33.2*  PLT 272   Recent Labs    09/10/22 1401  NA 137  K 3.5  CL 102  CO2 27  BUN 17  CREATININE 0.71  GLUCOSE 77  CALCIUM 8.6*   Lab Results  Component Value Date   INR 0.95 05/17/2018   INR 1.03 08/07/2015   INR 1.20 08/12/2014     Recent Radiographic Studies :  No results found.  DISCHARGE INSTRUCTIONS: Discharge Instructions     Call MD / Call 911   Complete by: As directed    If you experience chest pain or shortness of breath, CALL 911 and be transported to the hospital emergency room.  If you develope a fever above 101 F, pus (white drainage) or increased drainage or  redness at the wound, or calf pain, call your surgeon's office.   Constipation Prevention   Complete by: As directed    Drink plenty of fluids.  Prune juice may be helpful.  You may use a stool softener, such as Colace (over the counter) 100 mg twice a day.  Use MiraLax (over the counter) for constipation as needed.   Diet - low sodium heart healthy   Complete by: As directed    Discharge instructions   Complete by: As directed    INSTRUCTIONS AFTER JOINT REPLACEMENT   Remove items at home which could result in a fall. This includes throw rugs or furniture in walking pathways ICE to the affected joint every three hours while awake for 30 minutes at a time, for at least the first 3-5 days, and then as needed for pain and swelling.  Continue to use ice for pain and swelling. You may notice swelling that will progress down to the foot and ankle.  This is normal after surgery.  Elevate your leg when you are not up walking on it.   Continue to use the breathing machine you got in the hospital (incentive spirometer) which will help keep your temperature down.  It is common for your temperature to cycle up and down following surgery, especially at night when you are not up moving around and exerting yourself.  The breathing machine keeps your lungs expanded and your temperature down.   DIET:  As you were doing prior to hospitalization, we recommend a well-balanced diet.  DRESSING / WOUND CARE / SHOWERING  Keep the surgical dressing until follow up.  The dressing is water proof, so you can shower without any extra covering.  IF THE DRESSING FALLS OFF or the wound gets wet inside, change the dressing with sterile gauze.  Please use good hand washing techniques before changing the dressing.  Do not use any lotions or creams on the incision until instructed by your surgeon.    ACTIVITY  Increase activity slowly as tolerated, but follow the weight bearing instructions below.   No driving for 6 weeks or  until further direction given by your physician.  You cannot drive while taking narcotics.  No lifting  or carrying greater than 10 lbs. until further directed by your surgeon. Avoid periods of inactivity such as sitting longer than an hour when not asleep. This helps prevent blood clots.  You may return to work once you are authorized by your doctor.     WEIGHT BEARING   Weight bearing as tolerated with assist device (walker, cane, etc) as directed, use it as long as suggested by your surgeon or therapist, typically at least 4-6 weeks.   EXERCISES  Results after joint replacement surgery are often greatly improved when you follow the exercise, range of motion and muscle strengthening exercises prescribed by your doctor. Safety measures are also important to protect the joint from further injury. Any time any of these exercises cause you to have increased pain or swelling, decrease what you are doing until you are comfortable again and then slowly increase them. If you have problems or questions, call your caregiver or physical therapist for advice.   Rehabilitation is important following a joint replacement. After just a few days of immobilization, the muscles of the leg can become weakened and shrink (atrophy).  These exercises are designed to build up the tone and strength of the thigh and leg muscles and to improve motion. Often times heat used for twenty to thirty minutes before working out will loosen up your tissues and help with improving the range of motion but do not use heat for the first two weeks following surgery (sometimes heat can increase post-operative swelling).   These exercises can be done on a training (exercise) mat, on the floor, on a table or on a bed. Use whatever works the best and is most comfortable for you.    Use music or television while you are exercising so that the exercises are a pleasant break in your day. This will make your life better with the exercises acting  as a break in your routine that you can look forward to.   Perform all exercises about fifteen times, three times per day or as directed.  You should exercise both the operative leg and the other leg as well.  Exercises include:   Quad Sets - Tighten up the muscle on the front of the thigh (Quad) and hold for 5-10 seconds.   Straight Leg Raises - With your knee straight (if you were given a brace, keep it on), lift the leg to 60 degrees, hold for 3 seconds, and slowly lower the leg.  Perform this exercise against resistance later as your leg gets stronger.  Leg Slides: Lying on your back, slowly slide your foot toward your buttocks, bending your knee up off the floor (only go as far as is comfortable). Then slowly slide your foot back down until your leg is flat on the floor again.  Angel Wings: Lying on your back spread your legs to the side as far apart as you can without causing discomfort.  Hamstring Strength:  Lying on your back, push your heel against the floor with your leg straight by tightening up the muscles of your buttocks.  Repeat, but this time bend your knee to a comfortable angle, and push your heel against the floor.  You may put a pillow under the heel to make it more comfortable if necessary.   A rehabilitation program following joint replacement surgery can speed recovery and prevent re-injury in the future due to weakened muscles. Contact your doctor or a physical therapist for more information on knee rehabilitation.    CONSTIPATION  Constipation is defined medically as fewer than three stools per week and severe constipation as less than one stool per week.  Even if you have a regular bowel pattern at home, your normal regimen is likely to be disrupted due to multiple reasons following surgery.  Combination of anesthesia, postoperative narcotics, change in appetite and fluid intake all can affect your bowels.   YOU MUST use at least one of the following options; they are  listed in order of increasing strength to get the job done.  They are all available over the counter, and you may need to use some, POSSIBLY even all of these options:    Drink plenty of fluids (prune juice may be helpful) and high fiber foods Colace 100 mg by mouth twice a day  Senokot for constipation as directed and as needed Dulcolax (bisacodyl), take with full glass of water  Miralax (polyethylene glycol) once or twice a day as needed.  If you have tried all these things and are unable to have a bowel movement in the first 3-4 days after surgery call either your surgeon or your primary doctor.    If you experience loose stools or diarrhea, hold the medications until you stool forms back up.  If your symptoms do not get better within 1 week or if they get worse, check with your doctor.  If you experience "the worst abdominal pain ever" or develop nausea or vomiting, please contact the office immediately for further recommendations for treatment.   ITCHING:  If you experience itching with your medications, try taking only a single pain pill, or even half a pain pill at a time.  You can also use Benadryl over the counter for itching or also to help with sleep.   TED HOSE STOCKINGS:  Use stockings on both legs until for at least 2 weeks or as directed by physician office. They may be removed at night for sleeping.  MEDICATIONS:  See your medication summary on the "After Visit Summary" that nursing will review with you.  You may have some home medications which will be placed on hold until you complete the course of blood thinner medication.  It is important for you to complete the blood thinner medication as prescribed.  PRECAUTIONS:  If you experience chest pain or shortness of breath - call 911 immediately for transfer to the hospital emergency department.   If you develop a fever greater that 101 F, purulent drainage from wound, increased redness or drainage from wound, foul odor from the  wound/dressing, or calf pain - CONTACT YOUR SURGEON.                                                   FOLLOW-UP APPOINTMENTS:  If you do not already have a post-op appointment, please call the office for an appointment to be seen by your surgeon.  Guidelines for how soon to be seen are listed in your "After Visit Summary", but are typically between 1-4 weeks after surgery.  OTHER INSTRUCTIONS:   Knee Replacement:  Do not place pillow under knee, focus on keeping the knee straight while resting. CPM instructions: 0-90 degrees, 2 hours in the morning, 2 hours in the afternoon, and 2 hours in the evening. Place foam block, curve side up under heel at all times except when in CPM or when walking.  DO NOT modify, tear, cut, or change the foam block in any way.  POST-OPERATIVE OPIOID TAPER INSTRUCTIONS: It is important to wean off of your opioid medication as soon as possible. If you do not need pain medication after your surgery it is ok to stop day one. Opioids include: Codeine, Hydrocodone(Norco, Vicodin), Oxycodone(Percocet, oxycontin) and hydromorphone amongst others.  Long term and even short term use of opiods can cause: Increased pain response Dependence Constipation Depression Respiratory depression And more.  Withdrawal symptoms can include Flu like symptoms Nausea, vomiting And more Techniques to manage these symptoms Hydrate well Eat regular healthy meals Stay active Use relaxation techniques(deep breathing, meditating, yoga) Do Not substitute Alcohol to help with tapering If you have been on opioids for less than two weeks and do not have pain than it is ok to stop all together.  Plan to wean off of opioids This plan should start within one week post op of your joint replacement. Maintain the same interval or time between taking each dose and first decrease the dose.  Cut the total daily intake of opioids by one tablet each day Next start to increase the time between  doses. The last dose that should be eliminated is the evening dose.     MAKE SURE YOU:  Understand these instructions.  Get help right away if you are not doing well or get worse.    Thank you for letting us be a part of your medical care team.  It is a privilege we respect greatly.  We hope these instructions will help you stay on track for a fast and full recovery!   Increase activity slowly as tolerated   Complete by: As directed    Post-operative opioid taper instructions:   Complete by: As directed    POST-OPERATIVE OPIOID TAPER INSTRUCTIONS: It is important to wean off of your opioid medication as soon as possible. If you do not need pain medication after your surgery it is ok to stop day one. Opioids include: Codeine, Hydrocodone(Norco, Vicodin), Oxycodone(Percocet, oxycontin) and hydromorphone amongst others.  Long term and even short term use of opiods can cause: Increased pain response Dependence Constipation Depression Respiratory depression And more.  Withdrawal symptoms can include Flu like symptoms Nausea, vomiting And more Techniques to manage these symptoms Hydrate well Eat regular healthy meals Stay active Use relaxation techniques(deep breathing, meditating, yoga) Do Not substitute Alcohol to help with tapering If you have been on opioids for less than two weeks and do not have pain than it is ok to stop all together.  Plan to wean off of opioids This plan should start within one week post op of your joint replacement. Maintain the same interval or time between taking each dose and first decrease the dose.  Cut the total daily intake of opioids by one tablet each day Next start to increase the time between doses. The last dose that should be eliminated is the evening dose.          DISCHARGE MEDICATIONS:   Allergies as of 09/15/2022   No Known Allergies      Medication List     TAKE these medications    aspirin 81 MG chewable tablet Chew 1  tablet (81 mg total) by mouth 2 (two) times daily.   atorvastatin 10 MG tablet Commonly known as: LIPITOR Take 1 tablet (10 mg total) by mouth daily.   HYDROmorphone 2 MG tablet Commonly known as: DILAUDID Take 1-2 tablets (2-4 mg total) by mouth  every 6 (six) hours as needed for severe pain (pain score 7-10).   methadone 10 MG/5ML solution Commonly known as: DOLOPHINE Take 195 mg by mouth daily.   Methocarbamol 1000 MG Tabs Take 1,000 mg by mouth every 6 (six) hours as needed for muscle spasms. What changed:  medication strength how much to take               Durable Medical Equipment  (From admission, onward)           Start     Ordered   09/14/22 1557  DME Walker rolling  Once       Question:  Patient needs a walker to treat with the following condition  Answer:  S/P total knee replacement   09/14/22 1556   09/14/22 1557  DME 3 n 1  Once        09/14/22 1556   09/14/22 1557  DME Bedside commode  Once       Question:  Patient needs a bedside commode to treat with the following condition  Answer:  S/P total knee replacement   09/14/22 1556            FOLLOW UP VISIT:    DISPOSITION: HOME VS. SNF  Dental Antibiotics:  In most cases prophylactic antibiotics for Dental procdeures after total joint surgery are not necessary.  Exceptions are as follows:  1. History of prior total joint infection  2. Severely immunocompromised (Organ Transplant, cancer chemotherapy, Rheumatoid biologic meds such as Humera)  3. Poorly controlled diabetes (A1C &gt; 8.0, blood glucose over 200)  If you have one of these conditions, contact your surgeon for an antibiotic prescription, prior to your dental procedure.   CONDITION:  Good   Guy Sandifer 09/15/2022, 5:04 PM

## 2022-09-15 NOTE — Progress Notes (Signed)
Physical Therapy Treatment Patient Details Name: [name] MRN: [id number] DOB: 1960-07-20 Today's Date: 09/15/2022   History of Present Illness 62 yo male presents to therapy s/p R TKA on 09/14/2022 due to failure of conservative measures. Pt PMH includes but is not limited to: CAD, B carotid bruitis, hx of tobacco abuse, venous insufficiency of leg, CHF, anemia, kidney stones, lymphocele R groin, HTN, s/p CABG, B  CT release, hepatitis, gastrostomy,  R hip fx s/p IM nail repair (2001),and  L TKA (05/18/2022).    PT Comments    POD # 1 pm session Assisted with amb a greater distance in hallway.  Assisted to bathroom to void.  Extended time needed to attempt void standing in bathroom with hips against wall.  Assisted back to bed per pt request to rest. Addressed all mobility questions, discussed appropriate activity, educated on use of ICE.  Pt ready for D/C to home.   Awaiting Ortho MD orders.  Recommendations for follow up therapy are one component of a multi-disciplinary discharge planning process, led by the attending physician.  Recommendations may be updated based on patient status, additional functional criteria and insurance authorization.  Follow Up Recommendations       Assistance Recommended at Discharge Intermittent Supervision/Assistance  Patient can return home with the following A little help with walking and/or transfers;A little help with bathing/dressing/bathroom;Assistance with cooking/housework;Assist for transportation;Help with stairs or ramp for entrance   Equipment Recommendations  None recommended by PT    Recommendations for Other Services       Precautions / Restrictions Precautions Precautions: Knee;Fall Precaution Comments: no pillow under knee Restrictions Weight Bearing Restrictions: No Other Position/Activity Restrictions: WBAT     Mobility  Bed Mobility Overal bed mobility: Needs Assistance Bed Mobility: Supine to Sit     Supine to  sit: Supervision, Min guard     General bed mobility comments: self able using strap    Transfers Overall transfer level: Needs assistance Equipment used: Rolling walker (2 wheels) Transfers: Sit to/from Stand Sit to Stand: Supervision, Min guard           General transfer comment: slightly impulsive.  VC's on safety    Ambulation/Gait Ambulation/Gait assistance: Supervision, Min guard Gait Distance (Feet): 75 Feet Assistive device: Rolling walker (2 wheels) Gait Pattern/deviations: Step-to pattern, Antalgic, Wide base of support Gait velocity: decreased     General Gait Details: tolerated a functional distance.  Poor forward flexed posture (chronic)   Stairs Stairs:  (pt has a RAMP)           Wheelchair Mobility    Modified Rankin (Stroke Patients Only)       Balance                                            Cognition Arousal/Alertness: Awake/alert Behavior During Therapy: WFL for tasks assessed/performed Overall Cognitive Status: Within Functional Limits for tasks assessed                                 General Comments: AxO x 3 motivated.  Had his "other" knee replaced Feb 2024.        Exercises      General Comments        Pertinent Vitals/Pain Pain Assessment Pain Assessment: 0-10 Pain Score: 7  Pain  Location: R knee and leg Pain Descriptors / Indicators: Constant, Crushing, Discomfort, Operative site guarding Pain Intervention(s): Monitored during session, Premedicated before session, Repositioned, Ice applied    Home Living                          Prior Function            PT Goals (current goals can now be found in the care plan section) Progress towards PT goals: Progressing toward goals    Frequency    7X/week      PT Plan Current plan remains appropriate    Co-evaluation              AM-PAC PT "6 Clicks" Mobility   Outcome Measure  Help needed turning from  your back to your side while in a flat bed without using bedrails?: A Little Help needed moving from lying on your back to sitting on the side of a flat bed without using bedrails?: A Little Help needed moving to and from a bed to a chair (including a wheelchair)?: A Little Help needed standing up from a chair using your arms (e.g., wheelchair or bedside chair)?: A Little Help needed to walk in hospital room?: A Little Help needed climbing 3-5 steps with a railing? : A Little 6 Click Score: 18    End of Session Equipment Utilized During Treatment: Gait belt Activity Tolerance: Patient tolerated treatment well Patient left: in bed Nurse Communication: Mobility status;Patient requests pain meds PT Visit Diagnosis: Unsteadiness on feet (R26.81);Other abnormalities of gait and mobility (R26.89);Difficulty in walking, not elsewhere classified (R26.2);Pain Pain - Right/Left: Right Pain - part of body: Knee;Leg     Time: 1610-9604 PT Time Calculation (min) (ACUTE ONLY): 26 min  Charges:  $Gait Training: 8-22 mins $Therapeutic Activity: 8-22 mins                     Felecia Shelling  PTA Acute  Rehabilitation Services Office M-F          [phone]

## 2022-09-15 NOTE — Progress Notes (Signed)
NT stated did not see any IV to pt's wrist before taking pt out to dc home. Daughter reported patient took it out and didn't bleed at all.

## 2022-09-16 ENCOUNTER — Encounter (HOSPITAL_COMMUNITY): Payer: Self-pay | Admitting: Orthopedic Surgery

## 2022-09-21 ENCOUNTER — Ambulatory Visit (HOSPITAL_BASED_OUTPATIENT_CLINIC_OR_DEPARTMENT_OTHER): Payer: PPO | Admitting: General Surgery

## 2022-09-22 NOTE — Progress Notes (Signed)
NUNO, BRUBACHER ([id number]) [id number] Page 1 of 11 Visit Report for 08/31/2022 Chief Complaint Document Details Patient Name: Date of Service: [name], [place] 08/31/2022 7:30 A M Medical Record Number: [id number] Patient Account Number: [id number] Date of Birth/Sex: Treating RN: [birth year] (62 y.o. Male) Primary Care Provider: Mortimer Fries, Jonny Ruiz Other Clinician: Referring Provider: Treating Provider/Extender: Lacretia Leigh in Treatment: 0 Information Obtained from: Patient Chief Complaint 05/20/2020; patient is here for review of wounds on his bilateral lower legs in the setting of severe chronic venous insufficiency with lymphedema 02/03/2022: returns to clinic today with new wounds on RLE 08/31/2022: Right ankle ulcer Electronic Signature(s) Signed: 08/31/2022 8:21:35 AM By: Duanne Guess MD FACS Entered By: Duanne Guess on 08/31/2022 08:21:35 -------------------------------------------------------------------------------- Debridement Details Patient Name: Date of Service: Marvin Burn, PA UL J. 08/31/2022 7:30 A M Medical Record Number: [id number] Patient Account Number: [id number] Date of Birth/Sex: Treating RN: September 21, 1960 (61 y.o. Male) Karie Schwalbe Primary Care Provider: Mortimer Fries, Jonny Ruiz Other Clinician: Referring Provider: Treating Provider/Extender: Lacretia Leigh in Treatment: 0 Debridement Performed for Assessment: Wound #9 Right,Lateral Ankle Performed By: Physician Duanne Guess, MD Debridement Type: Debridement Severity of Tissue Pre Debridement: Fat layer exposed Level of Consciousness (Pre-procedure): Awake and Alert Pre-procedure Verification/Time Out Yes - 08:27 Taken: Start Time: 08:27 Pain Control: Lidocaine 4% T opical Solution Percent of Wound Bed Debrided: 100% T Area Debrided (cm): otal 1.55 Tissue and other material debrided: Non-Viable, Slough, Slough Level: Non-Viable  Tissue Debridement Description: Selective/Open Wound Instrument: Curette Bleeding: Minimum Hemostasis Achieved: Pressure End Time: 08:28 Procedural Pain: 0 Post Procedural Pain: 0 Response to Treatment: Procedure was tolerated well Level of Consciousness (Post- Awake and Alert procedure): Post Debridement Measurements of Total Wound Length: (cm) 1.8 Width: (cm) 1.1 Depth: (cm) 0.2 Volume: (cm) 0.311 Character of Wound/Ulcer Post Debridement: Improved Severity of Tissue Post Debridement: Fat layer exposed Post Procedure Diagnosis Same as DILLYN, MENNA ([id number]) [id number] Page 2 of 11 Notes Scribed for Dr. Lady Gary by J.Scotton Electronic Signature(s) Signed: 08/31/2022 10:05:05 AM By: Duanne Guess MD FACS Signed: 08/31/2022 5:27:50 PM By: Karie Schwalbe RN Entered By: Karie Schwalbe on 08/31/2022 08:29:45 -------------------------------------------------------------------------------- HPI Details Patient Name: Date of Service: Marvin Burn, PA UL J. 08/31/2022 7:30 A M Medical Record Number: [id number] Patient Account Number: [id number] Date of Birth/Sex: Treating RN: 06-26-1960 (61 y.o. Male) Primary Care Provider: Mortimer Fries, Jonny Ruiz Other Clinician: Referring Provider: Treating Provider/Extender: Lacretia Leigh in Treatment: 0 History of Present Illness HPI Description: ADMISSION 05/20/2020 This is a 62 year old man who is not a diabetic however he has a long history of chronic venous insufficiency with wounds on his lower extremities. He went to the wound care center in [place] for a long period of time with recurrent wounds in these areas treated with compression wraps. He comes in today with wounds on his bilateral lower legs that he says have been there about a year on the right and about 6 months on the left. He went to see Dr. Darrick Penna on 01/17/2020 who noted that he had no history of DVT but does have a  history of venous insufficiency wounds. They noted that he was considering laser ablation in October 2020 but he canceled the procedure. He had an episode of bleeding from varicose veins in 2019. He had repeated venous reflux study on 10/6 which showed no evidence of a DVT or SVT Noted that he had venous reflux in the right  greater saphenous vein in the thigh and the calf on the right. Also . noted to have venous reflux in the right femoral vein right popliteal vein and right small saphenous vein. At the time in October they wanted the wounds to heal a bit he also also had a right groin wound which is apparently closed. Not sure about the follow-up with vein and vascular. He comes in today with some light compression wraps and collagen on the wound placed by Dr. Lequita Halt who is a Development worker, international aid in [place]. He has 2 areas on the left leg 1 anteriorly and a large area posteriorly and a smaller area on the right anterior lower leg. He has marked lower extremity edema which is nonpitting. Hemosiderin deposition on both lower legs extensively. There is dilated venules in his feet. Past medical history includes history of opioid abuse in remission, left nephrolithiasis, history of an ablation although I did not see this in Dr. Darrick Penna notes, congestive heart failure and anemia ABIs in our clinic were noncompressible bilaterally 05/27/2020; we admitted this patient to clinic last week. He has bilateral superficial venous insufficiency with bilateral wounds left greater than right. We put him in 4 layer compression with silver alginate. The he was previously cared for in the [place] wound care clinic when it was open. His wounds are better with compression this week and there is certainly less swelling. 2/21; bilateral superficial venous insufficiency wounds. A cluster on the left lateral. He has very well-defined areas on the right anterior right and right lateral. We are using silver alginate and 4-layer  compression 2/28; bilateral superficial venous insufficiency wounds in the setting of significant stage III lymphedema. He has a cluster on the left which is smaller. His area on the right anterior and right lateral also look better. 3/14 superficial bilateral venous insufficiency wounds. Much better on both sides in fact the area on the right posterior is healed. He still has 2 small open areas on the right anterior and the left lateral 3/21; superficial bilateral venous insufficiency wounds. Everything is closed on the right side. Small area still remains on the left. We will transition him today to his right extremiteaze stocking. Still dressing the remaining wound on the left with compression 3/28; the patient's right leg remains closed in his wraparound stocking. Disappointingly today the area on the left lateral leg looks about the same. I thought this might be closed with been using silver alginate 4/4; right leg remains in his wraparound stocking.. Left lateral leg measures smaller even under illumination the wound looks good. Changed him to [facility] with additional ABD compression last week 4/11; left lateral posterior very tiny superficial wound. This should be closed by next week. Using Hydrofera Blue. 4/18; left lateral wound is totally epithelialized. He has his own external compression stocking and compression pumps. READMISSION 02/03/2022 This is a 62 year old man well-known to the wound care center. He has chronic venous insufficiency and lymphedema. He has been evaluated in the past and has documented venous reflux, but thus far, has not undergone ablation. He returns to clinic today with multiple wounds on his right lower extremity. He reports that the medial lower leg wound started when something sharp stuck into his leg about a year ago. He says he was trying to treat this at home and took a course of cephalexin that he had leftover from a previous event. It did not improve  and in the past 3 to 4 weeks, it has actually gotten worse,  to the point that he is unable to use his lymphedema pumps secondary to pain. He has another ulcer on his left lateral ankle and one between his first and second toes, both present for about 3 to 4 weeks, per his report. On his right medial lower leg, there are 2 ulcers with slough accumulated. The surface underneath the slough is quite fibrous, consistent with his history of multiple lower leg ulcerations. He has a triangular wound on his right lateral ankle, similar in appearance to the others. Between his first and second toes, he has what appears to be athlete's foot, with a fungal odor and skin breakdown with eschar and slough present. 02/09/2022: On the right medial lower leg wound, there is significant slough accumulation. The right lateral ankle wound is gray and the patient reports an increase in pain in this site. The area between his first and second toes has dried out considerably. The more proximal ulcer on his right posterior leg has some slough buildup over a fibrotic surface. Edema control is markedly improved. 02/17/2022: The right medial lower leg and right lateral ankle wound still have a lot of slough accumulation, but they are less painful. The culture that I took last week grew out multiple gram-positive species. The area between his first and second toes still has a consistent appearance with athlete's foot. The more proximal lateral lower leg wound has healed. He still has not been scheduled for his repeat venous reflux studies. 02/25/2022: He had his repeat reflux studies done but for some reason they only did the right leg. He does have evidence of reflux. In the past, bilateral studies were performed and he has significant venous reflux in both legs. His edema control is excellent this week. The areas between his toes are drying up. The right medial lower leg wound is substantially smaller with just a little bit of  eschar overlying the open area. The lateral ankle wound still has some slough and eschar accumulation and remains fairly tender. DYONTE, COSGROVE ([id number]) [id number] Page 3 of 11 03/11/2022: The right medial leg wound has closed. The lateral ankle wound is substantially smaller with just some slough and eschar present. It is tender, but less so than on previous visits. He is still having some issues with moisture between his toes and athlete's foot. His wrap slipped and the area above the wrap is markedly swollen, well the areas that was still contained in the wrap has excellent edema control. 03/18/2022: The lateral ankle wound has contracted further with just slough and eschar present. He is not experiencing any pain in the location. His interdigital tinea pedis has responded well to over-the-counter antifungal spray powder. We had him in a zinc-based Coflex wrap and this stayed up well; edema control is much better this week. 12/12; only a small open wound remains on the right lateral ankle. The medial ankle is closed. The interdigital is a a result of tinea and that is closed as well. Primary dressing is silver alginate under compression. The patient is not using his compression pumps that this area. He has some form of compression stocking on the left leg that he was also wearing on the right at the time this wound formed although I am not sure of the compression. He bought these on [facility] 03/31/2022: The lateral ankle wound remains open under a layer of eschar. It is small. Edema control is excellent. The interdigital areas have closed and remain dry with his use of the over-the-counter spray  powder. 04/07/2022: The lateral ankle wound is down to just a couple of millimeters. Edema control is excellent. 04/14/2022: His wound is healed. READMISSION 08/31/2022 He returns today with a wound on his right lateral ankle. It is in the same location as on prior occasions. It  is tender and there is slough accumulation. Electronic Signature(s) Signed: 08/31/2022 9:02:01 AM By: Duanne Guess MD FACS Previous Signature: 08/31/2022 8:45:13 AM Version By: Duanne Guess MD FACS Previous Signature: 08/31/2022 8:24:36 AM Version By: Duanne Guess MD FACS Previous Signature: 08/31/2022 8:01:05 AM Version By: Duanne Guess MD FACS Entered By: Duanne Guess on 08/31/2022 09:02:01 -------------------------------------------------------------------------------- Physical Exam Details Patient Name: Date of Service: Marvin Burn, PA UL J. 08/31/2022 7:30 A M Medical Record Number: [id number] Patient Account Number: [id number] Date of Birth/Sex: Treating RN: 12/14/1960 (61 y.o. Male) Primary Care Provider: Mortimer Fries, Jonny Ruiz Other Clinician: Referring Provider: Treating Provider/Extender: Lacretia Leigh in Treatment: 0 Constitutional . . . . No acute distress. Respiratory Normal work of breathing on room air. Cardiovascular 2+ pitting edema bilaterally. Notes 08/31/2022: On his right lateral ankle, there is a triangular ulceration that exposes the fat layer. There is slough on the surface. It is tender, but there is no surrounding erythema, induration, malodor, or other sign of gross infection. Electronic Signature(s) Signed: 08/31/2022 9:03:45 AM By: Duanne Guess MD FACS Entered By: Duanne Guess on 08/31/2022 09:03:45 -------------------------------------------------------------------------------- Physician Orders Details Patient Name: Date of Service: Marvin Burn, PA UL J. 08/31/2022 7:30 A M Medical Record Number: [id number] Patient Account Number: [id number] Date of Birth/Sex: Treating RN: 02-Dec-1960 (61 y.o. Male) Karie Schwalbe Primary Care Provider: Mortimer Fries, Jonny Ruiz Other Clinician: Referring Provider: Treating Provider/Extender: Lacretia Leigh in Treatment: 0 Verbal / Phone Orders: No Diagnosis  Coding ICD-10 Coding Code Description [phone] Non-pressure chronic ulcer of right ankle with fat layer exposed KENDRICH, CAMPER ([id number]) [id number] Page 4 of 11 I87.2 Venous insufficiency (chronic) (peripheral) I89.0 Lymphedema, not elsewhere classified I25.10 Atherosclerotic heart disease of native coronary artery without angina pectoris Follow-up Appointments ppointment in 1 week. - Dr. Lady Gary Room 3 Return A Wed 09/09/22 at 7:30am Anesthetic (In clinic) Topical Lidocaine 4% applied to wound bed Bathing/ Shower/ Hygiene May shower with protection but do not get wound dressing(s) wet. Protect dressing(s) with water repellant cover (for example, large plastic bag) or a cast cover and may then take shower. - Please do not get right leg compression wraps wet. Wear a Cast Protector on right leg when showering. Edema Control - Lymphedema / SCD / Other Elevate legs to the level of the heart or above for 30 minutes daily and/or when sitting for 3-4 times a day throughout the day. Avoid standing for long periods of time. Patient to wear own compression stockings every day. - Left leg (then after compression wraps are stopped on right leg, wear compression stockings on right leg. Exercise regularly - as tolerated Moisturize legs daily. Off-Loading Other: - Elevate legs when sitting at heart level or above heart level Wound Treatment Wound #9 - Ankle Wound Laterality: Right, Lateral Cleanser: Soap and Water 3 x Per Day/30 Days Discharge Instructions: May shower and wash wound with dial antibacterial soap and water prior to dressing change. Cleanser: Wound Cleanser 3 x Per Day/30 Days Discharge Instructions: Cleanse the wound with wound cleanser prior to applying a clean dressing using gauze sponges, not tissue or cotton balls. Peri-Wound Care: Sween Lotion (Moisturizing lotion) 3 x Per Day/30 Days Discharge Instructions: Apply  moisturizing lotion as  directed Topical: Mupirocin Ointment 3 x Per Day/30 Days Discharge Instructions: Apply Mupirocin (Bactroban) as instructed Prim Dressing: Maxorb Extra Ag+ Alginate Dressing, 2x2 (in/in) 3 x Per Day/30 Days ary Discharge Instructions: Apply to wound bed as instructed Secondary Dressing: ABD Pad, 8x10 3 x Per Day/30 Days Discharge Instructions: Apply over primary dressing as directed. Secondary Dressing: Woven Gauze Sponge, Non-Sterile 4x4 in 3 x Per Day/30 Days Discharge Instructions: Apply over primary dressing as directed. Secured With: Transpore Surgical Tape, 2x10 (in/yd) 3 x Per Day/30 Days Discharge Instructions: Secure dressing with tape as directed. Compression Wrap: FourPress (4 layer compression wrap) 3 x Per Day/30 Days Discharge Instructions: Apply four layer compression as directed. May also use Urgo K2 compression system as alternative. Compression Wrap: Urgo K2, (equivalent to a 4 layer) two layer compression system, regular 3 x Per Day/30 Days Discharge Instructions: OR Apply Urgo K2 as directed (alternative to 4 layer compression). Electronic Signature(s) Signed: 08/31/2022 10:05:05 AM By: Duanne Guess MD FACS Entered By: Duanne Guess on 08/31/2022 09:03:59 -------------------------------------------------------------------------------- Problem List Details Patient Name: Date of Service: Marvin Burn, PA UL J. 08/31/2022 7:30 A M Medical Record Number: [id number] Patient Account Number: [id number] Date of Birth/Sex: Treating RN: Nov 12, 1960 (61 y.o. Male) Primary Care Provider: Mortimer Fries, Jonny Ruiz Other Clinician: Referring Provider: Treating Provider/Extender: Lacretia Leigh in Treatment: [address] JAYGER, SCHMOCK ([id number]) [id number] Page 5 of 11 Active Problems ICD-10 Encounter Code Description Active Date MDM Diagnosis L97.312 Non-pressure chronic ulcer of right ankle with fat layer exposed 08/31/2022 No Yes I87.2 Venous  insufficiency (chronic) (peripheral) 08/31/2022 No Yes I89.0 Lymphedema, not elsewhere classified 08/31/2022 No Yes I25.10 Atherosclerotic heart disease of native coronary artery without angina pectoris 08/31/2022 No Yes Inactive Problems Resolved Problems Electronic Signature(s) Signed: 08/31/2022 8:21:11 AM By: Duanne Guess MD FACS Previous Signature: 08/31/2022 7:59:57 AM Version By: Duanne Guess MD FACS Entered By: Duanne Guess on 08/31/2022 08:21:11 -------------------------------------------------------------------------------- Progress Note Details Patient Name: Date of Service: Marvin Burn, PA UL J. 08/31/2022 7:30 A M Medical Record Number: [id number] Patient Account Number: [id number] Date of Birth/Sex: Treating RN: 25-Apr-1960 (61 y.o. Male) Primary Care Provider: Mortimer Fries, Jonny Ruiz Other Clinician: Referring Provider: Treating Provider/Extender: Lacretia Leigh in Treatment: 0 Subjective Chief Complaint Information obtained from Patient 05/20/2020; patient is here for review of wounds on his bilateral lower legs in the setting of severe chronic venous insufficiency with lymphedema 02/03/2022: returns to clinic today with new wounds on RLE 08/31/2022: Right ankle ulcer History of Present Illness (HPI) ADMISSION 05/20/2020 This is a 62 year old man who is not a diabetic however he has a long history of chronic venous insufficiency with wounds on his lower extremities. He went to the wound care center in [place] for a long period of time with recurrent wounds in these areas treated with compression wraps. He comes in today with wounds on his bilateral lower legs that he says have been there about a year on the right and about 6 months on the left. He went to see Dr. Darrick Penna on 01/17/2020 who noted that he had no history of DVT but does have a history of venous insufficiency wounds. They noted that he was considering laser ablation in October 2020 but he  canceled the procedure. He had an episode of bleeding from varicose veins in 2019. He had repeated venous reflux study on 10/6 which showed no evidence of a DVT or SVT Noted that he had venous reflux in  the right greater saphenous vein in the thigh and the calf on the right. Also . noted to have venous reflux in the right femoral vein right popliteal vein and right small saphenous vein. At the time in October they wanted the wounds to heal a bit he also also had a right groin wound which is apparently closed. Not sure about the follow-up with vein and vascular. He comes in today with some light compression wraps and collagen on the wound placed by Dr. Lequita Halt who is a Development worker, international aid in [place]. He has 2 areas on the left leg 1 anteriorly and a large area posteriorly and a smaller area on the right anterior lower leg. He has marked lower extremity edema which is nonpitting. Hemosiderin deposition on both lower legs extensively. There is dilated venules in his feet. Past medical history includes history of opioid abuse in remission, left nephrolithiasis, history of an ablation although I did not see this in Dr. Darrick Penna notes, congestive heart failure and anemia ABIs in our clinic were noncompressible bilaterally 05/27/2020; we admitted this patient to clinic last week. He has bilateral superficial venous insufficiency with bilateral wounds left greater than right. We put him in 4 layer compression with silver alginate. The he was previously cared for in the [place] wound care clinic when it was open. His wounds are better with compression this week and there is certainly less swelling. 2/21; bilateral superficial venous insufficiency wounds. A cluster on the left lateral. He has very well-defined areas on the right anterior right and right lateral. We are using silver alginate and 4-layer compression 2/28; bilateral superficial venous insufficiency wounds in the setting of significant stage III  lymphedema. He has a cluster on the left which is smaller. His area on the right anterior and right lateral also look better. 3/14 superficial bilateral venous insufficiency wounds. Much better on both sides in fact the area on the right posterior is healed. He still has 2 small open areas on the right anterior and the left lateral KEANE, YARNELL ([id number]) [id number] Page 6 of 11 3/21; superficial bilateral venous insufficiency wounds. Everything is closed on the right side. Small area still remains on the left. We will transition him today to his right extremiteaze stocking. Still dressing the remaining wound on the left with compression 3/28; the patient's right leg remains closed in his wraparound stocking. Disappointingly today the area on the left lateral leg looks about the same. I thought this might be closed with been using silver alginate 4/4; right leg remains in his wraparound stocking.. Left lateral leg measures smaller even under illumination the wound looks good. Changed him to [facility] with additional ABD compression last week 4/11; left lateral posterior very tiny superficial wound. This should be closed by next week. Using Hydrofera Blue. 4/18; left lateral wound is totally epithelialized. He has his own external compression stocking and compression pumps. READMISSION 02/03/2022 This is a 62 year old man well-known to the wound care center. He has chronic venous insufficiency and lymphedema. He has been evaluated in the past and has documented venous reflux, but thus far, has not undergone ablation. He returns to clinic today with multiple wounds on his right lower extremity. He reports that the medial lower leg wound started when something sharp stuck into his leg about a year ago. He says he was trying to treat this at home and took a course of cephalexin that he had leftover from a previous event. It did not improve and in the  past 3 to 4  weeks, it has actually gotten worse, to the point that he is unable to use his lymphedema pumps secondary to pain. He has another ulcer on his left lateral ankle and one between his first and second toes, both present for about 3 to 4 weeks, per his report. On his right medial lower leg, there are 2 ulcers with slough accumulated. The surface underneath the slough is quite fibrous, consistent with his history of multiple lower leg ulcerations. He has a triangular wound on his right lateral ankle, similar in appearance to the others. Between his first and second toes, he has what appears to be athlete's foot, with a fungal odor and skin breakdown with eschar and slough present. 02/09/2022: On the right medial lower leg wound, there is significant slough accumulation. The right lateral ankle wound is gray and the patient reports an increase in pain in this site. The area between his first and second toes has dried out considerably. The more proximal ulcer on his right posterior leg has some slough buildup over a fibrotic surface. Edema control is markedly improved. 02/17/2022: The right medial lower leg and right lateral ankle wound still have a lot of slough accumulation, but they are less painful. The culture that I took last week grew out multiple gram-positive species. The area between his first and second toes still has a consistent appearance with athlete's foot. The more proximal lateral lower leg wound has healed. He still has not been scheduled for his repeat venous reflux studies. 02/25/2022: He had his repeat reflux studies done but for some reason they only did the right leg. He does have evidence of reflux. In the past, bilateral studies were performed and he has significant venous reflux in both legs. His edema control is excellent this week. The areas between his toes are drying up. The right medial lower leg wound is substantially smaller with just a little bit of eschar overlying the open  area. The lateral ankle wound still has some slough and eschar accumulation and remains fairly tender. 03/11/2022: The right medial leg wound has closed. The lateral ankle wound is substantially smaller with just some slough and eschar present. It is tender, but less so than on previous visits. He is still having some issues with moisture between his toes and athlete's foot. His wrap slipped and the area above the wrap is markedly swollen, well the areas that was still contained in the wrap has excellent edema control. 03/18/2022: The lateral ankle wound has contracted further with just slough and eschar present. He is not experiencing any pain in the location. His interdigital tinea pedis has responded well to over-the-counter antifungal spray powder. We had him in a zinc-based Coflex wrap and this stayed up well; edema control is much better this week. 12/12; only a small open wound remains on the right lateral ankle. The medial ankle is closed. The interdigital is a a result of tinea and that is closed as well. Primary dressing is silver alginate under compression. The patient is not using his compression pumps that this area. He has some form of compression stocking on the left leg that he was also wearing on the right at the time this wound formed although I am not sure of the compression. He bought these on [facility] 03/31/2022: The lateral ankle wound remains open under a layer of eschar. It is small. Edema control is excellent. The interdigital areas have closed and remain dry with his use of the  over-the-counter spray powder. 04/07/2022: The lateral ankle wound is down to just a couple of millimeters. Edema control is excellent. 04/14/2022: His wound is healed. READMISSION 08/31/2022 He returns today with a wound on his right lateral ankle. It is in the same location as on prior occasions. It is tender and there is slough accumulation. Patient History Information obtained from  Patient. Allergies No Known Allergies Family History Cancer, Diabetes - Father, Hypertension - Father, No family history of Heart Disease, Hereditary Spherocytosis, Kidney Disease, Lung Disease, Seizures, Stroke, Thyroid Problems, Tuberculosis. Social History Former smoker, Marital Status - Divorced, Alcohol Use - Never, Drug Use - Prior History, Caffeine Use - Rarely. Medical History Eyes Denies history of Cataracts, Glaucoma, Optic Neuritis Ear/Nose/Mouth/Throat Denies history of Chronic sinus problems/congestion, Middle ear problems Hematologic/Lymphatic Patient has history of Anemia Denies history of Hemophilia, Human Immunodeficiency Virus, Lymphedema, Sickle Cell Disease Respiratory Patient has history of Sleep Apnea Denies history of Aspiration, Asthma, Chronic Obstructive Pulmonary Disease (COPD), Pneumothorax, Tuberculosis Cardiovascular Patient has history of Congestive Heart Failure, Coronary Artery Disease, Peripheral Venous Disease Denies history of Angina, Arrhythmia, Deep Vein Thrombosis, Hypertension, Hypotension, Myocardial Infarction, Peripheral Arterial Disease, Phlebitis, Vasculitis Gastrointestinal Denies history of Cirrhosis , Colitis, Crohns, Hepatitis A, Hepatitis B, Hepatitis C Endocrine VINCENTE, ODER ([id number]) [id number] Page 7 of 11 Denies history of Type I Diabetes, Type II Diabetes Genitourinary Denies history of End Stage Renal Disease Immunological Denies history of Lupus Erythematosus, Raynauds, Scleroderma Integumentary (Skin) Denies history of History of [name] Musculoskeletal Patient has history of Osteoarthritis Denies history of Gout, Rheumatoid Arthritis, Osteomyelitis Neurologic Denies history of Dementia, Neuropathy, Quadriplegia, Paraplegia, Seizure Disorder Oncologic Denies history of Received Chemotherapy, Received Radiation Psychiatric Denies history of Anorexia/bulimia, Confinement  Anxiety Hospitalization/Surgery History - right hip nailing 10/17/2019. - 05/17/2018 nephrolithotomy left. - 01/29/2017 right lymph noes biopsy. - cystoscopy 2017, 2018. - 2017 gastric bypass surgery. Medical A Surgical History Notes nd Gastrointestinal gastric bypass Review of Systems (ROS) Eyes Denies complaints or symptoms of Dry Eyes, Vision Changes, Glasses / Contacts. Objective Constitutional No acute distress. Vitals Time Taken: 8:02 AM, Height: 71 in, Weight: 205 lbs, BMI: 28.6, Temperature: 98.3 F, Pulse: 63 bpm, Respiratory Rate: 16 breaths/min, Blood Pressure: 116/72 mmHg. Respiratory Normal work of breathing on room air. Cardiovascular 2+ pitting edema bilaterally. General Notes: 08/31/2022: On his right lateral ankle, there is a triangular ulceration that exposes the fat layer. There is slough on the surface. It is tender, but there is no surrounding erythema, induration, malodor, or other sign of gross infection. Integumentary (Hair, Skin) Wound #9 status is Open. Original cause of wound was Gradually Appeared. The date acquired was: 08/01/2022. The wound is located on the Right,Lateral Ankle. The wound measures 1.8cm length x 1.1cm width x 0.2cm depth; 1.555cm^2 area and 0.311cm^3 volume. There is Fat Layer (Subcutaneous Tissue) exposed. There is no tunneling or undermining noted. There is a medium amount of serosanguineous drainage noted. There is small (1-33%) red granulation within the wound bed. There is a large (67-100%) amount of necrotic tissue within the wound bed including Eschar and Adherent Slough. The periwound skin appearance had no abnormalities noted for texture. The periwound skin appearance had no abnormalities noted for moisture. Periwound temperature was noted as No Abnormality. Assessment Active Problems ICD-10 Non-pressure chronic ulcer of right ankle with fat layer exposed Venous insufficiency (chronic) (peripheral) Lymphedema, not elsewhere  classified Atherosclerotic heart disease of native coronary artery without angina pectoris Procedures Wound #9 Pre-procedure diagnosis of Wound #9 is a  Venous Leg Ulcer located on the Right,Lateral Ankle .Severity of Tissue Pre Debridement is: Fat layer exposed. There was a Selective/Open Wound Non-Viable Tissue Debridement with a total area of 1.55 sq cm performed by Duanne Guess, MD. With the following instrument(s): Curette to remove Non-Viable tissue/material. Material removed includes [facility] after achieving pain control using Lidocaine 4% Topical Solution. No specimens were taken. A time out was conducted at 08:27, prior to the start of the procedure. A Minimum amount of bleeding was controlled with Pressure. The procedure was tolerated well with a pain level of 0 throughout and a pain level of 0 following the procedure. Post Debridement Measurements: 1.8cm length YUSEF, SUCH ([id number]) [id number] Page 8 of 11 x 1.1cm width x 0.2cm depth; 0.311cm^3 volume. Character of Wound/Ulcer Post Debridement is improved. Severity of Tissue Post Debridement is: Fat layer exposed. Post procedure Diagnosis Wound #9: Same as Pre-Procedure General Notes: Scribed for Dr. Lady Gary by J.Scotton. Pre-procedure diagnosis of Wound #9 is a Venous Leg Ulcer located on the Right,Lateral Ankle . There was a Four Layer Compression Therapy Procedure by Karie Schwalbe, RN. Post procedure Diagnosis Wound #9: Same as Pre-Procedure Plan Follow-up Appointments: Return Appointment in 1 week. - Dr. Lady Gary Room 3 Wed 09/09/22 at 7:30am Anesthetic: (In clinic) Topical Lidocaine 4% applied to wound bed Bathing/ Shower/ Hygiene: May shower with protection but do not get wound dressing(s) wet. Protect dressing(s) with water repellant cover (for example, large plastic bag) or a cast cover and may then take shower. - Please do not get right leg compression wraps wet. Wear a Cast Protector on  right leg when showering. Edema Control - Lymphedema / SCD / Other: Elevate legs to the level of the heart or above for 30 minutes daily and/or when sitting for 3-4 times a day throughout the day. Avoid standing for long periods of time. Patient to wear own compression stockings every day. - Left leg (then after compression wraps are stopped on right leg, wear compression stockings on right leg. Exercise regularly - as tolerated Moisturize legs daily. Off-Loading: Other: - Elevate legs when sitting at heart level or above heart level WOUND #9: - Ankle Wound Laterality: Right, Lateral Cleanser: Soap and Water 3 x Per Day/30 Days Discharge Instructions: May shower and wash wound with dial antibacterial soap and water prior to dressing change. Cleanser: Wound Cleanser 3 x Per Day/30 Days Discharge Instructions: Cleanse the wound with wound cleanser prior to applying a clean dressing using gauze sponges, not tissue or cotton balls. Peri-Wound Care: Sween Lotion (Moisturizing lotion) 3 x Per Day/30 Days Discharge Instructions: Apply moisturizing lotion as directed Topical: Mupirocin Ointment 3 x Per Day/30 Days Discharge Instructions: Apply Mupirocin (Bactroban) as instructed Prim Dressing: Maxorb Extra Ag+ Alginate Dressing, 2x2 (in/in) 3 x Per Day/30 Days ary Discharge Instructions: Apply to wound bed as instructed Secondary Dressing: ABD Pad, 8x10 3 x Per Day/30 Days Discharge Instructions: Apply over primary dressing as directed. Secondary Dressing: Woven Gauze Sponge, Non-Sterile 4x4 in 3 x Per Day/30 Days Discharge Instructions: Apply over primary dressing as directed. Secured With: Transpore Surgical T ape, 2x10 (in/yd) 3 x Per Day/30 Days Discharge Instructions: Secure dressing with tape as directed. Com pression Wrap: FourPress (4 layer compression wrap) 3 x Per Day/30 Days Discharge Instructions: Apply four layer compression as directed. May also use Urgo K2 compression system as  alternative. Com pression Wrap: Urgo K2, (equivalent to a 4 layer) two layer compression system, regular 3 x Per Day/30 Days Discharge Instructions:  OR Apply Urgo K2 as directed (alternative to 4 layer compression). 08/31/2022: On his right lateral ankle, there is a triangular ulceration that exposes the fat layer. There is slough on the surface. It is tender, but there is no surrounding erythema, induration, malodor, or other sign of gross infection. I used a curette to debride slough from the wound. Due to the tenderness, I am going to add a little topical mupirocin, even though there are no gross signs of infection. We will do silver alginate and 4-layer compression equivalent. Follow-up in 1 week. Electronic Signature(s) Signed: 08/31/2022 9:12:06 AM By: Duanne Guess MD FACS Previous Signature: 08/31/2022 9:04:31 AM Version By: Duanne Guess MD FACS Entered By: Duanne Guess on 08/31/2022 09:12:05 -------------------------------------------------------------------------------- HxROS Details Patient Name: Date of Service: Marvin Burn, PA UL J. 08/31/2022 7:30 A M Medical Record Number: [id number] Patient Account Number: [id number] Date of Birth/Sex: Treating RN: 18-May-1960 (61 y.o. Male) Brenton Grills Primary Care Provider: Mortimer Fries, Jonny Ruiz Other Clinician: Referring Provider: Treating Provider/Extender: Lacretia Leigh in Treatment: 0 Information Obtained From Patient CONNY, BERI ([id number]) [id number] Page 9 of 11 Eyes Complaints and Symptoms: Negative for: Dry Eyes; Vision Changes; Glasses / Contacts Medical History: Negative for: Cataracts; Glaucoma; Optic Neuritis Ear/Nose/Mouth/Throat Medical History: Negative for: Chronic sinus problems/congestion; Middle ear problems Hematologic/Lymphatic Medical History: Positive for: Anemia Negative for: Hemophilia; Human Immunodeficiency Virus; Lymphedema; Sickle Cell  Disease Respiratory Medical History: Positive for: Sleep Apnea Negative for: Aspiration; Asthma; Chronic Obstructive Pulmonary Disease (COPD); Pneumothorax; Tuberculosis Cardiovascular Medical History: Positive for: Congestive Heart Failure; Coronary Artery Disease; Peripheral Venous Disease Negative for: Angina; Arrhythmia; Deep Vein Thrombosis; Hypertension; Hypotension; Myocardial Infarction; Peripheral Arterial Disease; Phlebitis; Vasculitis Gastrointestinal Medical History: Negative for: Cirrhosis ; Colitis; Crohns; Hepatitis A; Hepatitis B; Hepatitis C Past Medical History Notes: gastric bypass Endocrine Medical History: Negative for: Type I Diabetes; Type II Diabetes Genitourinary Medical History: Negative for: End Stage Renal Disease Immunological Medical History: Negative for: Lupus Erythematosus; Raynauds; Scleroderma Integumentary (Skin) Medical History: Negative for: History of [name] Musculoskeletal Medical History: Positive for: Osteoarthritis Negative for: Gout; Rheumatoid Arthritis; Osteomyelitis Neurologic Medical History: Negative for: Dementia; Neuropathy; Quadriplegia; Paraplegia; Seizure Disorder Oncologic Medical History: Negative for: Received Chemotherapy; Received Radiation Psychiatric Medical History: Negative for: Wyn Quaker SANAT, [place] ([id number]) [id number] Page 10 of 11 Immunizations Pneumococcal Vaccine: Received Pneumococcal Vaccination: Yes Received Pneumococcal Vaccination On or After 60th Birthday: Yes Implantable Devices None Hospitalization / Surgery History Type of Hospitalization/Surgery right hip nailing 10/17/2019 05/17/2018 nephrolithotomy left 01/29/2017 right lymph noes biopsy cystoscopy 2017, 2018 2017 gastric bypass surgery Family and Social History Cancer: Yes; Diabetes: Yes - Father; Heart Disease: No; Hereditary Spherocytosis: No; Hypertension: Yes - Father;  Kidney Disease: No; Lung Disease: No; Seizures: No; Stroke: No; Thyroid Problems: No; Tuberculosis: No; Former smoker; Marital Status - Divorced; Alcohol Use: Never; Drug Use: Prior History; Caffeine Use: Rarely; Financial Concerns: No; Food, Clothing or Shelter Needs: No; Support System Lacking: No; Transportation Concerns: No Electronic Signature(s) Signed: 08/31/2022 10:05:05 AM By: Duanne Guess MD FACS Signed: 08/31/2022 5:27:50 PM By: Karie Schwalbe RN Signed: 09/22/2022 7:50:28 AM By: Brenton Grills Entered By: Karie Schwalbe on 08/31/2022 08:07:14 -------------------------------------------------------------------------------- SuperBill Details Patient Name: Date of Service: Marvin Burn, PA UL J. 08/31/2022 Medical Record Number: [id number] Patient Account Number: [id number] Date of Birth/Sex: Treating RN: [birth year] (61 y.o. Male) Primary Care Provider: Mortimer Fries, Jonny Ruiz Other Clinician: Referring Provider: Treating Provider/Extender: Lacretia Leigh in Treatment: 0 Diagnosis Coding ICD-10  Codes Code Description L97.312 Non-pressure chronic ulcer of right ankle with fat layer exposed I87.2 Venous insufficiency (chronic) (peripheral) I89.0 Lymphedema, not elsewhere classified I25.10 Atherosclerotic heart disease of native coronary artery without angina pectoris Facility Procedures : CPT4 Code: 45409811 Description: 99213 - WOUND CARE VISIT-LEV 3 EST PT Modifier: Quantity: 1 : CPT4 Code: 91478295 Description: 97597 - DEBRIDE WOUND 1ST 20 SQ CM OR < ICD-10 Diagnosis Description L97.312 Non-pressure chronic ulcer of right ankle with fat layer exposed Modifier: Quantity: 1 Physician Procedures : CPT4 Code Description Modifier [id number] 99214 - WC PHYS LEVEL 4 - EST PT 25 ICD-10 Diagnosis Description L97.312 Non-pressure chronic ulcer of right ankle with fat layer exposed I87.2 Venous insufficiency (chronic) (peripheral) I89.0 Lymphedema, not  elsewhere  classified I25.10 Atherosclerotic heart disease of native coronary artery without angina pectoris Quantity: 1 : [id number] 97597 - WC PHYS DEBR WO ANESTH 20 SQ CM ICD-10 Diagnosis Description L97.312 Non-pressure chronic ulcer of right ankle with fat layer exposed JARET, BEHN ([id number]) [id number] Quantity: 1 Page 11 of 11 Electronic Signature(s) Signed: 09/09/2022 12:59:50 PM By: Pearletha Alfred Signed: 09/09/2022 4:02:09 PM By: Duanne Guess MD FACS Previous Signature: 08/31/2022 9:12:32 AM Version By: Duanne Guess MD FACS Entered By: Pearletha Alfred on 09/09/2022 12:59:50

## 2022-09-24 DIAGNOSIS — M25661 Stiffness of right knee, not elsewhere classified: Secondary | ICD-10-CM | POA: Diagnosis not present

## 2022-09-24 DIAGNOSIS — R2689 Other abnormalities of gait and mobility: Secondary | ICD-10-CM | POA: Diagnosis not present

## 2022-09-24 DIAGNOSIS — Z96651 Presence of right artificial knee joint: Secondary | ICD-10-CM | POA: Diagnosis not present

## 2022-09-24 DIAGNOSIS — M25461 Effusion, right knee: Secondary | ICD-10-CM | POA: Diagnosis not present

## 2022-09-24 DIAGNOSIS — Z471 Aftercare following joint replacement surgery: Secondary | ICD-10-CM | POA: Diagnosis not present

## 2022-09-24 DIAGNOSIS — M25561 Pain in right knee: Secondary | ICD-10-CM | POA: Diagnosis not present

## 2022-09-30 DIAGNOSIS — M25461 Effusion, right knee: Secondary | ICD-10-CM | POA: Diagnosis not present

## 2022-09-30 DIAGNOSIS — R2689 Other abnormalities of gait and mobility: Secondary | ICD-10-CM | POA: Diagnosis not present

## 2022-09-30 DIAGNOSIS — M25561 Pain in right knee: Secondary | ICD-10-CM | POA: Diagnosis not present

## 2022-09-30 DIAGNOSIS — M25661 Stiffness of right knee, not elsewhere classified: Secondary | ICD-10-CM | POA: Diagnosis not present

## 2022-10-02 DIAGNOSIS — F1111 Opioid abuse, in remission: Secondary | ICD-10-CM | POA: Diagnosis not present

## 2022-10-05 ENCOUNTER — Ambulatory Visit (HOSPITAL_BASED_OUTPATIENT_CLINIC_OR_DEPARTMENT_OTHER): Payer: PPO | Admitting: General Surgery

## 2022-10-05 DIAGNOSIS — M25661 Stiffness of right knee, not elsewhere classified: Secondary | ICD-10-CM | POA: Diagnosis not present

## 2022-10-05 DIAGNOSIS — M25461 Effusion, right knee: Secondary | ICD-10-CM | POA: Diagnosis not present

## 2022-10-05 DIAGNOSIS — R2689 Other abnormalities of gait and mobility: Secondary | ICD-10-CM | POA: Diagnosis not present

## 2022-10-05 DIAGNOSIS — M25561 Pain in right knee: Secondary | ICD-10-CM | POA: Diagnosis not present

## 2022-10-07 DIAGNOSIS — M25561 Pain in right knee: Secondary | ICD-10-CM | POA: Diagnosis not present

## 2022-10-07 DIAGNOSIS — R2689 Other abnormalities of gait and mobility: Secondary | ICD-10-CM | POA: Diagnosis not present

## 2022-10-07 DIAGNOSIS — M25461 Effusion, right knee: Secondary | ICD-10-CM | POA: Diagnosis not present

## 2022-10-07 DIAGNOSIS — M25661 Stiffness of right knee, not elsewhere classified: Secondary | ICD-10-CM | POA: Diagnosis not present

## 2022-10-08 ENCOUNTER — Ambulatory Visit: Payer: PPO | Attending: Cardiology | Admitting: Cardiology

## 2022-10-12 DIAGNOSIS — M25461 Effusion, right knee: Secondary | ICD-10-CM | POA: Diagnosis not present

## 2022-10-12 DIAGNOSIS — M25561 Pain in right knee: Secondary | ICD-10-CM | POA: Diagnosis not present

## 2022-10-12 DIAGNOSIS — R2689 Other abnormalities of gait and mobility: Secondary | ICD-10-CM | POA: Diagnosis not present

## 2022-10-12 DIAGNOSIS — M25661 Stiffness of right knee, not elsewhere classified: Secondary | ICD-10-CM | POA: Diagnosis not present

## 2022-10-12 NOTE — Progress Notes (Signed)
TRYSTYN, ARMOND (SON) ([id number]) [id number] Page 1 of 7 Visit Report for 08/31/2022 Allergy List Details Patient Name: Date of Service: [name], [place] J. 08/31/2022 7:30 A M Medical Record Number: [id number] Patient Account Number: [id number] Date of Birth/Sex: Treating RN: [birth year] (62 y.o. Male) Karie Schwalbe Primary Care Mackinzee Roszak: Mortimer Fries, Jonny Ruiz Other Clinician: Referring Jayven Naill: Treating Kavi Almquist/Extender: Lacretia Leigh in Treatment: 0 Allergies Active Allergies No Known Allergies Allergy Notes Electronic Signature(s) Signed: 08/31/2022 5:27:50 PM By: Karie Schwalbe RN Entered By: Karie Schwalbe on 08/31/2022 08:06:54 -------------------------------------------------------------------------------- Arrival Information Details Patient Name: Date of Service: [name], [place] J. 08/31/2022 7:30 A M Medical Record Number: [id number] Patient Account Number: [id number] Date of Birth/Sex: Treating RN: 08-12-1960 (61 y.o. Male) Karie Schwalbe Primary Care Jadrien Narine: Mortimer Fries, Jonny Ruiz Other Clinician: Referring Phat Dalton: Treating Symphony Demuro/Extender: Lacretia Leigh in Treatment: 0 Visit Information Patient Arrived: Gilmer Mor Arrival Time: 07:58 Accompanied By: dad Transfer Assistance: None Patient Identification Verified: Yes Patient Has Alerts: Yes Patient Alerts: ABI R 1.37 History Since Last Visit Added or deleted any medications: No Any new allergies or adverse reactions: No Had a fall or experienced change in activities of daily living that may affect risk of falls: No Signs or symptoms of abuse/neglect since last visito No Hospitalized since last visit: No Implantable device outside of the clinic excluding cellular tissue based products placed in the center since last visit: No Pain Present Now: Yes Electronic Signature(s) Signed: 08/31/2022 5:27:50 PM By: Karie Schwalbe RN Entered By: Karie Schwalbe  on 08/31/2022 07:58:56 CRAY, PREW (SON) ([id number]) [id number] Page 2 of 7 -------------------------------------------------------------------------------- Clinic Level of Care Assessment Details Patient Name: Date of Service: [name], [place] 08/31/2022 7:30 A M Medical Record Number: [id number] Patient Account Number: [id number] Date of Birth/Sex: Treating RN: March 02, 1961 (62 y.o. Male) Primary Care Chauntel Windsor: Mortimer Fries, Jonny Ruiz Other Clinician: Referring Damontae Loppnow: Treating Saraphina Lauderbaugh/Extender: Lacretia Leigh in Treatment: 0 Clinic Level of Care Assessment Items TOOL 1 Quantity Score []  - 0 Use when EandM and Procedure is performed on INITIAL visit ASSESSMENTS - Nursing Assessment / Reassessment X- 1 20 General Physical Exam (combine w/ comprehensive assessment (listed just below) when performed on new pt. evals) X- 1 25 Comprehensive Assessment (HX, ROS, Risk Assessments, Wounds Hx, etc.) ASSESSMENTS - Wound and Skin Assessment / Reassessment []  - 0 Dermatologic / Skin Assessment (not related to wound area) ASSESSMENTS - Ostomy and/or Continence Assessment and Care []  - 0 Incontinence Assessment and Management []  - 0 Ostomy Care Assessment and Management (repouching, etc.) PROCESS - Coordination of Care X - Simple Patient / Family Education for ongoing care 1 15 []  - 0 Complex (extensive) Patient / Family Education for ongoing care X- 1 10 Staff obtains Chiropractor, Records, T Results / Process Orders est []  - 0 Staff telephones HHA, Nursing Homes / Clarify orders / etc []  - 0 Routine Transfer to another Facility (non-emergent condition) []  - 0 Routine Hospital Admission (non-emergent condition) X- 1 15 New Admissions / Manufacturing engineer / Ordering NPWT Apligraf, etc. , []  - 0 Emergency Hospital Admission (emergent condition) PROCESS - Special Needs []  - 0 Pediatric / Minor Patient Management []  - 0 Isolation  Patient Management []  - 0 Hearing / Language / Visual special needs []  - 0 Assessment of Community assistance (transportation, D/C planning, etc.) []  - 0 Additional assistance / Altered mentation []  - 0 Support Surface(s) Assessment (bed, cushion, seat, etc.) INTERVENTIONS -  Miscellaneous []  - 0 External ear exam []  - 0 Patient Transfer (multiple staff / Nurse, adult / Similar devices) []  - 0 Simple Staple / Suture removal (25 or less) []  - 0 Complex Staple / Suture removal (26 or more) []  - 0 Hypo/Hyperglycemic Management (do not check if billed separately) []  - 0 Ankle / Brachial Index (ABI) - do not check if billed separately Has the patient been seen at the hospital within the last three years: Yes Total Score: 85 Level Of Care: New/Established - Level 3 Electronic Signature(s) Signed: 10/12/2022 3:00:20 PM By: Pearletha Alfred Entered By: Pearletha Alfred on 09/09/2022 12:59:30 NANDAN, BOLLMANN (SON) ([id number]) [id number] Page 3 of 7 -------------------------------------------------------------------------------- Compression Therapy Details Patient Name: Date of Service: [name], [place] 08/31/2022 7:30 A M Medical Record Number: [id number] Patient Account Number: [id number] Date of Birth/Sex: Treating RN: 1960-12-28 (62 y.o. Male) Karie Schwalbe Primary Care Cree Kunert: Mortimer Fries, Jonny Ruiz Other Clinician: Referring Maddi Collar: Treating Graysen Woodyard/Extender: Lacretia Leigh in Treatment: 0 Compression Therapy Performed for Wound Assessment: Wound #9 Right,Lateral Ankle Performed By: Clinician Karie Schwalbe, RN Compression Type: Four Layer Post Procedure Diagnosis Same as Pre-procedure Electronic Signature(s) Signed: 08/31/2022 5:27:50 PM By: Karie Schwalbe RN Entered By: Karie Schwalbe on 08/31/2022 08:30:01 -------------------------------------------------------------------------------- Lower Extremity Assessment Details Patient  Name: Date of Service: [name], [place] J. 08/31/2022 7:30 A M Medical Record Number: [id number] Patient Account Number: [id number] Date of Birth/Sex: Treating RN: 1960-10-14 (61 y.o. Male) Karie Schwalbe Primary Care Brynlie Daza: Mortimer Fries, Jonny Ruiz Other Clinician: Referring Jocelyn Lowery: Treating Lakoda Mcanany/Extender: Lacretia Leigh in Treatment: 0 Edema Assessment Assessed: [Left: No] [Right: No] [Left: Edema] [Right: :] Calf Left: Right: Point of Measurement: 32 cm From Medial Instep 39 cm Ankle Left: Right: Point of Measurement: 10 cm From Medial Instep 27 cm Knee To Floor Left: Right: From Medial Instep 42 cm Vascular Assessment Pulses: Dorsalis Pedis Palpable: [Right:Yes] Electronic Signature(s) Signed: 08/31/2022 5:27:50 PM By: Karie Schwalbe RN Entered By: Karie Schwalbe on 08/31/2022 08:13:08 VIET, EWELL (SON) ([id number]) [id number] Page 4 of 7 -------------------------------------------------------------------------------- Multi Wound Chart Details Patient Name: Date of Service: [name], [place] 08/31/2022 7:30 A M Medical Record Number: [id number] Patient Account Number: [id number] Date of Birth/Sex: Treating RN: 1960-06-07 (62 y.o. Male) Primary Care Ranesha Val: Mortimer Fries, Jonny Ruiz Other Clinician: Referring Dionisia Pacholski: Treating Lola Czerwonka/Extender: Lacretia Leigh in Treatment: 0 Vital Signs Height(in): 71 Pulse(bpm): 63 Weight(lbs): 205 Blood Pressure(mmHg): 116/72 Body Mass Index(BMI): 28.6 Temperature(F): 98.3 Respiratory Rate(breaths/min): 16 [9:Photos:] [N/A:N/A] Right, Lateral Ankle N/A N/A Wound Location: Gradually Appeared N/A N/A Wounding Event: Venous Leg Ulcer N/A N/A Primary Etiology: Anemia, Sleep Apnea, Congestive N/A N/A Comorbid History: Heart Failure, Coronary Artery Disease, Peripheral Venous Disease, Osteoarthritis 08/01/2022 N/A N/A Date Acquired: 0 N/A N/A Weeks of  Treatment: Open N/A N/A Wound Status: No N/A N/A Wound Recurrence: 1.8x1.1x0.1 N/A N/A Measurements L x W x D (cm) 1.555 N/A N/A A (cm) : rea 0.156 N/A N/A Volume (cm) : Full Thickness Without Exposed N/A N/A Classification: Support Structures Medium N/A N/A Exudate Amount: Serosanguineous N/A N/A Exudate Type: red, brown N/A N/A Exudate Color: Small (1-33%) N/A N/A Granulation Amount: Red N/A N/A Granulation Quality: Large (67-100%) N/A N/A Necrotic Amount: Fat Layer (Subcutaneous Tissue): Yes N/A N/A Exposed Structures: Fascia: No Tendon: No Muscle: No Joint: No Bone: No None N/A N/A Epithelialization: No Abnormalities Noted N/A N/A Periwound Skin Texture: No Abnormalities Noted N/A N/A Periwound  Skin Moisture: No Abnormality N/A N/A Temperature: Treatment Notes Electronic Signature(s) Signed: 08/31/2022 8:21:17 AM By: Duanne Guess MD FACS Entered By: Duanne Guess on 08/31/2022 08:21:17 DONOVYN, ALTERMATT (SON) ([id number]) [id number] Page 5 of 7 -------------------------------------------------------------------------------- Pain Assessment Details Patient Name: Date of Service: [name], [place] 08/31/2022 7:30 A M Medical Record Number: [id number] Patient Account Number: [id number] Date of Birth/Sex: Treating RN: November 01, 1960 (62 y.o. Male) Karie Schwalbe Primary Care Syeda Prickett: Mortimer Fries, Jonny Ruiz Other Clinician: Referring Ryzen Deady: Treating Kip Kautzman/Extender: Lacretia Leigh in Treatment: 0 Active Problems Location of Pain Severity and Description of Pain Patient Has Paino Yes Site Locations Pain Location: Generalized Pain With Dressing Change: No Duration of the Pain. Constant / Intermittento Constant Rate the pain. Current Pain Level: 3 Worst Pain Level: 10 Least Pain Level: 3 Tolerable Pain Level: 3 Character of Pain Describe the Pain: Tender Pain Management and Medication Current Pain  Management: Medication: Yes Cold Application: No Rest: Yes Massage: No Activity: No T.E.N.S.: No Heat Application: No Leg drop or elevation: No Is the Current Pain Management Adequate: Adequate How does your wound impact your activities of daily livingo Sleep: No Bathing: No Appetite: No Relationship With Others: No Bladder Continence: No Emotions: No Bowel Continence: No Work: No Toileting: No Drive: No Dressing: No Hobbies: No Electronic Signature(s) Signed: 08/31/2022 5:27:50 PM By: Karie Schwalbe RN Entered By: Karie Schwalbe on 08/31/2022 08:17:46 -------------------------------------------------------------------------------- Wound Assessment Details Patient Name: Date of Service: [name], [place] J. 08/31/2022 7:30 A M Medical Record Number: [id number] Patient Account Number: [id number] Date of Birth/Sex: Treating RN: 1960/11/09 (61 y.o. Male) Zakarya, Fr[facility]man [facility]) ([id number]) [id number] Page 6 of 7 Primary Care Leler Brion: Mortimer Fries, Jonny Ruiz Other Clinician: Referring Danisha Brassfield: Treating Emma Schupp/Extender: Lacretia Leigh in Treatment: 0 Wound Status Wound Number: 9 Primary Venous Leg Ulcer Etiology: Wound Location: Right, Lateral Ankle Wound Open Wounding Event: Gradually Appeared Status: Date Acquired: 08/01/2022 Comorbid Anemia, Sleep Apnea, Congestive Heart Failure, Coronary Artery Weeks Of Treatment: 0 History: Disease, Peripheral Venous Disease, Osteoarthritis Clustered Wound: No Photos Wound Measurements Length: (cm) 1.8 Width: (cm) 1.1 Depth: (cm) 0.2 Area: (cm) 1.555 Volume: (cm) 0.311 % Reduction in Area: % Reduction in Volume: Epithelialization: None Tunneling: No Undermining: No Wound Description Classification: Full Thickness Without Exposed Suppor Exudate Amount: Medium Exudate Type: Serosanguineous Exudate Color: red, brown t Structures Foul Odor After Cleansing:  No Slough/Fibrino Yes Wound Bed Granulation Amount: Small (1-33%) Exposed Structure Granulation Quality: Red Fascia Exposed: No Necrotic Amount: Large (67-100%) Fat Layer (Subcutaneous Tissue) Exposed: Yes Necrotic Quality: Eschar, Adherent Slough Tendon Exposed: No Muscle Exposed: No Joint Exposed: No Bone Exposed: No Periwound Skin Texture Texture Color No Abnormalities Noted: Yes No Abnormalities Noted: No Moisture Temperature / Pain No Abnormalities Noted: Yes Temperature: No Abnormality Electronic Signature(s) Signed: 08/31/2022 5:27:50 PM By: Karie Schwalbe RN Entered By: Karie Schwalbe on 08/31/2022 08:26:23 -------------------------------------------------------------------------------- Vitals Details Patient Name: Date of Service: [name], [place] UL J. 08/31/2022 7:30 A M Medical Record Number: [id number] Patient Account Number: [id number] Date of Birth/Sex: Treating RN: May 11, 1960 (61 y.o. Male) Karie Schwalbe Primary Care Gayanne Prescott: Mortimer Fries, Jonny Ruiz Other Clinician: Referring Maily Debarge: Treating Kairy Folsom/Extender: Harkirat, Craige Plato) ([id number]) [id number] Page 7 of 7 Weeks in Treatment: 0 Vital Signs Time Taken: 08:02 Temperature (F): 98.3 Height (in): 71 Pulse (bpm): 63 Weight (lbs): 205 Respiratory Rate (breaths/min): 16 Body Mass Index (BMI): 28.6 Blood Pressure (mmHg): 116/72 Reference Range:  80 - 120 mg / dl Electronic Signature(s) Signed: 08/31/2022 5:27:50 PM By: Karie Schwalbe RN Entered By: Karie Schwalbe on 08/31/2022 08:11:14

## 2022-10-16 DIAGNOSIS — M25661 Stiffness of right knee, not elsewhere classified: Secondary | ICD-10-CM | POA: Diagnosis not present

## 2022-10-16 DIAGNOSIS — M25461 Effusion, right knee: Secondary | ICD-10-CM | POA: Diagnosis not present

## 2022-10-16 DIAGNOSIS — R2689 Other abnormalities of gait and mobility: Secondary | ICD-10-CM | POA: Diagnosis not present

## 2022-10-16 DIAGNOSIS — M25561 Pain in right knee: Secondary | ICD-10-CM | POA: Diagnosis not present

## 2022-10-21 DIAGNOSIS — R2689 Other abnormalities of gait and mobility: Secondary | ICD-10-CM | POA: Diagnosis not present

## 2022-10-21 DIAGNOSIS — M25661 Stiffness of right knee, not elsewhere classified: Secondary | ICD-10-CM | POA: Diagnosis not present

## 2022-10-21 DIAGNOSIS — M25561 Pain in right knee: Secondary | ICD-10-CM | POA: Diagnosis not present

## 2022-10-21 DIAGNOSIS — M25461 Effusion, right knee: Secondary | ICD-10-CM | POA: Diagnosis not present

## 2022-10-22 DIAGNOSIS — F1111 Opioid abuse, in remission: Secondary | ICD-10-CM | POA: Diagnosis not present

## 2022-10-23 DIAGNOSIS — M25561 Pain in right knee: Secondary | ICD-10-CM | POA: Diagnosis not present

## 2022-10-23 DIAGNOSIS — R2689 Other abnormalities of gait and mobility: Secondary | ICD-10-CM | POA: Diagnosis not present

## 2022-10-23 DIAGNOSIS — M25461 Effusion, right knee: Secondary | ICD-10-CM | POA: Diagnosis not present

## 2022-10-23 DIAGNOSIS — M25661 Stiffness of right knee, not elsewhere classified: Secondary | ICD-10-CM | POA: Diagnosis not present

## 2022-10-27 DIAGNOSIS — R2689 Other abnormalities of gait and mobility: Secondary | ICD-10-CM | POA: Diagnosis not present

## 2022-10-27 DIAGNOSIS — M25661 Stiffness of right knee, not elsewhere classified: Secondary | ICD-10-CM | POA: Diagnosis not present

## 2022-10-27 DIAGNOSIS — M25561 Pain in right knee: Secondary | ICD-10-CM | POA: Diagnosis not present

## 2022-10-27 DIAGNOSIS — M25461 Effusion, right knee: Secondary | ICD-10-CM | POA: Diagnosis not present

## 2022-11-05 DIAGNOSIS — F1111 Opioid abuse, in remission: Secondary | ICD-10-CM | POA: Diagnosis not present

## 2022-11-12 ENCOUNTER — Encounter (HOSPITAL_BASED_OUTPATIENT_CLINIC_OR_DEPARTMENT_OTHER): Payer: PPO | Admitting: General Surgery

## 2022-11-12 DIAGNOSIS — L03115 Cellulitis of right lower limb: Secondary | ICD-10-CM | POA: Diagnosis not present

## 2022-11-12 DIAGNOSIS — Z96651 Presence of right artificial knee joint: Secondary | ICD-10-CM | POA: Diagnosis not present

## 2022-11-17 ENCOUNTER — Ambulatory Visit (HOSPITAL_BASED_OUTPATIENT_CLINIC_OR_DEPARTMENT_OTHER): Payer: PPO | Admitting: General Surgery

## 2022-11-19 DIAGNOSIS — F1111 Opioid abuse, in remission: Secondary | ICD-10-CM | POA: Diagnosis not present

## 2022-11-30 ENCOUNTER — Ambulatory Visit (HOSPITAL_BASED_OUTPATIENT_CLINIC_OR_DEPARTMENT_OTHER): Payer: PPO | Admitting: General Surgery

## 2022-12-03 DIAGNOSIS — F1111 Opioid abuse, in remission: Secondary | ICD-10-CM | POA: Diagnosis not present

## 2022-12-03 DIAGNOSIS — L03115 Cellulitis of right lower limb: Secondary | ICD-10-CM | POA: Diagnosis not present

## 2022-12-03 DIAGNOSIS — Z96651 Presence of right artificial knee joint: Secondary | ICD-10-CM | POA: Diagnosis not present

## 2022-12-17 DIAGNOSIS — Z96651 Presence of right artificial knee joint: Secondary | ICD-10-CM | POA: Diagnosis not present

## 2022-12-17 DIAGNOSIS — L03115 Cellulitis of right lower limb: Secondary | ICD-10-CM | POA: Diagnosis not present

## 2022-12-17 DIAGNOSIS — F1111 Opioid abuse, in remission: Secondary | ICD-10-CM | POA: Diagnosis not present

## 2022-12-24 NOTE — Progress Notes (Signed)
TYKEE, HATCHEL (SON) ([id number]) [id number] Page 1 of 8 Visit Report for 09/09/2022 Arrival Information Details Patient Name: Date of Service: [name], [place] J. 09/09/2022 7:30 A M Medical Record Number: [id number] Patient Account Number: [id number] Date of Birth/Sex: Treating RN: December 24, 1960 (62 y.o. Male) Tommie Ard Primary Care [name]: Mortimer Fries, Jonny Ruiz Other Clinician: Referring Giovanny Dugal: Treating Amarri Michaelson/Extender: Lacretia Leigh in Treatment: 1 Visit Information History Since Last Visit Added or deleted any medications: No Patient Arrived: [name] Any new allergies or adverse reactions: No Arrival Time: 07:40 Had a fall or experienced change in No Accompanied By: self activities of daily living that may affect Transfer Assistance: None risk of falls: Patient Identification Verified: Yes Signs or symptoms of abuse/neglect since last visito No Secondary Verification Process Completed: Yes Hospitalized since last visit: No Patient Has Alerts: Yes Implantable device outside of the clinic excluding No Patient Alerts: ABI R 1.37 cellular tissue based products placed in the center since last visit: Has Dressing in Place as Prescribed: Yes Pain Present Now: No Electronic Signature(s) Signed: 09/09/2022 9:11:33 AM By: Tommie Ard RN Entered By: Tommie Ard on 09/09/2022 07:40:58 -------------------------------------------------------------------------------- Clinic Level of Care Assessment Details Patient Name: Date of Service: [name], [place] J. 09/09/2022 7:30 A M Medical Record Number: [id number] Patient Account Number: [id number] Date of Birth/Sex: Treating RN: 04/21/1960 (61 y.o. Male) Tommie Ard Primary Care Jayvion Stefanski: Mortimer Fries, Jonny Ruiz Other Clinician: Referring Anntonette Madewell: Treating Janete Quilling/Extender: Lacretia Leigh in Treatment: 1 Clinic Level of Care Assessment Items TOOL 1 Quantity Score []  -  0 Use when EandM and Procedure is performed on INITIAL visit ASSESSMENTS - Nursing Assessment / Reassessment []  - 0 General Physical Exam (combine w/ comprehensive assessment (listed just below) when performed on new pt. evals) []  - 0 Comprehensive Assessment (HX, ROS, Risk Assessments, Wounds Hx, etc.) ASSESSMENTS - Wound and Skin Assessment / Reassessment []  - 0 Dermatologic / Skin Assessment (not related to wound area) ASSESSMENTS - Ostomy and/or Continence Assessment and Care []  - 0 Incontinence Assessment and Management []  - 0 Ostomy Care Assessment and Management (repouching, etc.) PROCESS - Coordination of Care []  - 0 Simple Patient / Family Education for ongoing care []  - 0 Complex (extensive) Patient / Family Education for ongoing care [name], [name] [facility]) ([id number]) [id number] Page 2 of 8 []  - 0 Staff obtains Consents, Records, T Results / Process Orders est []  - 0 Staff telephones HHA, Nursing Homes / Clarify orders / etc []  - 0 Routine Transfer to another Facility (non-emergent condition) []  - 0 Routine Hospital Admission (non-emergent condition) []  - 0 New Admissions / Manufacturing engineer / Ordering NPWT Apligraf, etc. , []  - 0 Emergency Hospital Admission (emergent condition) PROCESS - Special Needs []  - 0 Pediatric / Minor Patient Management []  - 0 Isolation Patient Management []  - 0 Hearing / Language / Visual special needs []  - 0 Assessment of Community assistance (transportation, D/C planning, etc.) []  - 0 Additional assistance / Altered mentation []  - 0 Support Surface(s) Assessment (bed, cushion, seat, etc.) INTERVENTIONS - Miscellaneous []  - 0 External ear exam []  - 0 Patient Transfer (multiple staff / Nurse, adult / Similar devices) []  - 0 Simple Staple / Suture removal (25 or less) []  - 0 Complex Staple / Suture removal (26 or more) []  - 0 Hypo/Hyperglycemic Management (do not check if billed  separately) []  - 0 Ankle / Brachial Index (ABI) - do not check if billed separately Has the  patient been seen at the hospital within the last three years: Yes Total Score: 0 Level Of Care: ____ Electronic Signature(s) Signed: 09/09/2022 9:11:33 AM By: Tommie Ard RN Entered By: Tommie Ard on 09/09/2022 08:09:11 -------------------------------------------------------------------------------- Compression Therapy Details Patient Name: Date of Service: Marvin Burn, [place] UL J. 09/09/2022 7:30 A M Medical Record Number: [id number] Patient Account Number: [id number] Date of Birth/Sex: Treating RN: 01-Oct-1960 (61 y.o. Male) Tommie Ard Primary Care Alrick Cubbage: Mortimer Fries, Jonny Ruiz Other Clinician: Referring Levern Pitter: Treating Jeniffer Culliver/Extender: Lacretia Leigh in Treatment: 1 Compression Therapy Performed for Wound Assessment: Wound #9 Right,Lateral Ankle Performed By: Clinician Tommie Ard, RN Compression Type: Four Layer Post Procedure Diagnosis Same as Pre-procedure Notes Urgo regular Electronic Signature(s) Signed: 09/09/2022 9:11:33 AM By: Tommie Ard RN Entered By: Tommie Ard on 09/09/2022 07:55:49 [name], [name] (SON) ([id number]) [id number] Page 3 of 8 -------------------------------------------------------------------------------- Encounter Discharge Information Details Patient Name: Date of Service: [name], [place] 09/09/2022 7:30 A M Medical Record Number: [id number] Patient Account Number: [id number] Date of Birth/Sex: Treating RN: Apr 25, 1960 (62 y.o. Male) Tommie Ard Primary Care Satara Virella: Mortimer Fries, Jonny Ruiz Other Clinician: Referring Malissie Musgrave: Treating Jowanna Loeffler/Extender: Lacretia Leigh in Treatment: 1 Encounter Discharge Information Items Post Procedure Vitals Discharge Condition: Stable Temperature (F): 98.0 Ambulatory Status: Cane Pulse (bpm): 74 Discharge Destination: Home Respiratory Rate  (breaths/min): 16 Transportation: Private Auto Blood Pressure (mmHg): 117/76 Accompanied By: self Schedule Follow-up Appointment: Yes Clinical Summary of Care: Electronic Signature(s) Signed: 09/09/2022 9:11:33 AM By: Tommie Ard RN Entered By: Tommie Ard on 09/09/2022 08:10:13 -------------------------------------------------------------------------------- Lower Extremity Assessment Details Patient Name: Date of Service: Marvin Burn, [place] UL J. 09/09/2022 7:30 A M Medical Record Number: [id number] Patient Account Number: [id number] Date of Birth/Sex: Treating RN: 10-27-1960 (61 y.o. Male) Tommie Ard Primary Care Ayrton Mcvay: Mortimer Fries, Jonny Ruiz Other Clinician: Referring Miley Blanchett: Treating [place]/Extender: Lacretia Leigh in Treatment: 1 Edema Assessment Assessed: [Left: No] [Right: No] [Left: Edema] [Right: :] Calf Left: Right: Point of Measurement: 32 cm From Medial Instep 43 cm Ankle Left: Right: Point of Measurement: 10 cm From Medial Instep 23.5 cm Vascular Assessment Pulses: Dorsalis Pedis Palpable: [Right:Yes] Electronic Signature(s) Signed: 09/09/2022 9:11:33 AM By: Tommie Ard RN Entered By: Tommie Ard on 09/09/2022 07:45:58 [name], [name] (SON) ([id number]) [id number] Page 4 of 8 -------------------------------------------------------------------------------- Multi Wound Chart Details Patient Name: Date of Service: [name], [place] J. 09/09/2022 7:30 A M Medical Record Number: [id number] Patient Account Number: [id number] Date of Birth/Sex: Treating RN: 02-26-1961 (62 y.o. Male) Primary Care Asjia Berrios: Mortimer Fries, Jonny Ruiz Other Clinician: Referring Madonna Flegal: Treating Tylea Hise/Extender: Lacretia Leigh in Treatment: 1 Vital Signs Height(in): 71 Pulse(bpm): 74 Weight(lbs): 205 Blood Pressure(mmHg): 117/76 Body Mass Index(BMI): 28.6 Temperature(F): 98.0 Respiratory Rate(breaths/min):  16 [9:Photos:] [N/A:N/A] Right, Lateral Ankle N/A N/A Wound Location: Gradually Appeared N/A N/A Wounding Event: Venous Leg Ulcer N/A N/A Primary Etiology: Anemia, Sleep Apnea, Congestive N/A N/A Comorbid History: Heart Failure, Coronary Artery Disease, Peripheral Venous Disease, Osteoarthritis 08/01/2022 N/A N/A Date Acquired: 1 N/A N/A Weeks of Treatment: Open N/A N/A Wound Status: No N/A N/A Wound Recurrence: 1.1x1.1x0.2 N/A N/A Measurements L x W x D (cm) 0.95 N/A N/A A (cm) : rea 0.19 N/A N/A Volume (cm) : 38.90% N/A N/A % Reduction in A rea: 38.90% N/A N/A % Reduction in Volume: Full Thickness Without Exposed N/A N/A Classification: Support Structures Medium N/A N/A Exudate A mount: Serosanguineous N/A N/A Exudate Type: red, brown N/A  N/A Exudate Color: Medium (34-66%) N/A N/A Granulation A mount: Red N/A N/A Granulation Quality: Medium (34-66%) N/A N/A Necrotic A mount: Eschar, Adherent Slough N/A N/A Necrotic Tissue: Fat Layer (Subcutaneous Tissue): Yes N/A N/A Exposed Structures: Fascia: No Tendon: No Muscle: No Joint: No Bone: No None N/A N/A Epithelialization: Debridement - Selective/Open Wound N/A N/A Debridement: Pre-procedure Verification/Time Out 07:53 N/A N/A Taken: Lidocaine 5% topical ointment N/A N/A Pain Control: Slough N/A N/A Tissue Debrided: Non-Viable Tissue N/A N/A Level: 0.95 N/A N/A Debridement A (sq cm): rea Curette N/A N/A Instrument: Minimum N/A N/A Bleeding: Pressure N/A N/A Hemostasis A chieved: Procedure was tolerated well N/A N/A Debridement Treatment Response: 1.1x1.1x0.2 N/A N/A Post Debridement Measurements L x W x D (cm) 0.19 N/A N/A Post Debridement Volume: (cm) No Abnormalities Noted N/A N/A Periwound Skin TextureBAKR, [name] (SON) ([id number]) [id number] Page 5 of 8 No Abnormalities Noted N/A N/A Periwound Skin Moisture: No Abnormalities Noted N/A  N/A Periwound Skin Color: No Abnormality N/A N/A Temperature: Compression Therapy N/A N/A Procedures Performed: Debridement Treatment Notes Electronic Signature(s) Signed: 09/09/2022 8:05:46 AM By: Duanne Guess MD FACS Entered By: Duanne Guess on 09/09/2022 08:05:46 -------------------------------------------------------------------------------- Multi-Disciplinary Care Plan Details Patient Name: Date of Service: Marvin Burn, [place] J. 09/09/2022 7:30 A M Medical Record Number: [id number] Patient Account Number: [id number] Date of Birth/Sex: Treating RN: 03-03-1961 (61 y.o. Male) Tommie Ard Primary Care Zakyla Tonche: Mortimer Fries, Jonny Ruiz Other Clinician: Referring Tonetta Napoles: Treating Semiyah Newgent/Extender: Lacretia Leigh in Treatment: 1 Active Inactive Electronic Signature(s) Signed: 10/20/2022 5:16:59 PM By: Shawn Stall RN, BSN Signed: 12/24/2022 2:11:14 PM By: Tommie Ard RN Previous Signature: 09/09/2022 9:11:33 AM Version By: Tommie Ard RN Entered By: Shawn Stall on 10/20/2022 17:16:59 -------------------------------------------------------------------------------- Pain Assessment Details Patient Name: Date of Service: Marvin Burn, PA UL J. 09/09/2022 7:30 A M Medical Record Number: [id number] Patient Account Number: [id number] Date of Birth/Sex: Treating RN: 1960/07/09 (61 y.o. Male) Tommie Ard Primary Care Elber Galyean: Mortimer Fries, Jonny Ruiz Other Clinician: Referring Aleria Maheu: Treating Tanesha Arambula/Extender: Lacretia Leigh in Treatment: 1 Active Problems Location of Pain Severity and Description of Pain Patient Has Paino No Site Locations Rate the pain. [name], [name] (SON) ([id number]) [id number] Page 6 of 8 Rate the pain. Current Pain Level: 0 Pain Management and Medication Current Pain Management: Electronic Signature(s) Signed: 09/09/2022 9:11:33 AM By: Tommie Ard RN Entered By: Tommie Ard on  09/09/2022 07:41:27 -------------------------------------------------------------------------------- Patient/Caregiver Education Details Patient Name: Date of Service: Marvin Burn, PA UL J. 5/29/2024andnbsp7:30 A M Medical Record Number: [id number] Patient Account Number: [id number] Date of Birth/Gender: Treating RN: [birth year] (62 y.o. Male) Tommie Ard Primary Care Physician: Mortimer Fries, Jonny Ruiz Other Clinician: Referring Physician: Treating Physician/Extender: Lacretia Leigh in Treatment: 1 Education Assessment Education Provided To: Patient Education Topics Provided Wound Debridement: Methods: Explain/Verbal Responses: Reinforcements needed, State content correctly Wound/Skin Impairment: Methods: Explain/Verbal Responses: Reinforcements needed, State content correctly Electronic Signature(s) Signed: 09/09/2022 9:11:33 AM By: Tommie Ard RN Entered By: Tommie Ard on 09/09/2022 08:00:18 -------------------------------------------------------------------------------- Wound Assessment Details Patient Name: Date of Service: Marvin Burn, PA UL J. 09/09/2022 7:30 A [name], [name] (SON) ([id number]) [id number] Page 7 of 8 Medical Record Number: [id number] Patient Account Number: [id number] Date of Birth/Sex: Treating RN: 1960/10/13 (62 y.o. Male) Tommie Ard Primary Care Shoshanna Mcquitty: Mortimer Fries, Jonny Ruiz Other Clinician: Referring Shaquisha Wynn: Treating [place] Masaki/Extender: Lacretia Leigh in Treatment: 1 Wound Status Wound Number: 9 Primary Venous Leg Ulcer Etiology: Wound Location:  Right, Lateral Ankle Wound Open Wounding Event: Gradually Appeared Status: Date Acquired: 08/01/2022 Comorbid Anemia, Sleep Apnea, Congestive Heart Failure, Coronary Artery Weeks Of Treatment: 1 History: Disease, Peripheral Venous Disease, Osteoarthritis Clustered Wound: No Photos Wound Measurements Length: (cm) 1.1 Width: (cm)  1.1 Depth: (cm) 0.2 Area: (cm) 0.95 Volume: (cm) 0.19 % Reduction in Area: 38.9% % Reduction in Volume: 38.9% Epithelialization: None Tunneling: No Undermining: No Wound Description Classification: Full Thickness Without Exposed Suppor Exudate Amount: Medium Exudate Type: Serosanguineous Exudate Color: red, brown t Structures Foul Odor After Cleansing: No Slough/Fibrino Yes Wound Bed Granulation Amount: Medium (34-66%) Exposed Structure Granulation Quality: Red Fascia Exposed: No Necrotic Amount: Medium (34-66%) Fat Layer (Subcutaneous Tissue) Exposed: Yes Necrotic Quality: Eschar, Adherent Slough Tendon Exposed: No Muscle Exposed: No Joint Exposed: No Bone Exposed: No Periwound Skin Texture Texture Color No Abnormalities Noted: Yes No Abnormalities Noted: Yes Moisture Temperature / Pain No Abnormalities Noted: Yes Temperature: No Abnormality Electronic Signature(s) Signed: 09/09/2022 9:11:33 AM By: Tommie Ard RN Entered By: Tommie Ard on 09/09/2022 07:48:49 -------------------------------------------------------------------------------- Vitals Details Patient Name: Date of Service: Marvin Burn, [place] UL J. 09/09/2022 7:30 A M Medical Record Number: [id number] Patient Account Number: [id number] Date of Birth/Sex: Treating RN: 1961/03/18 (61 y.o. Male) [name], [name] [place]) ([id number]) [id number] Page 8 of 8 Primary Care [name]: Mortimer Fries, Jonny Ruiz Other Clinician: Referring Arianie Couse: Treating Amias Hutchinson/Extender: Lacretia Leigh in Treatment: 1 Vital Signs Time Taken: 07:40 Temperature (F): 98.0 Height (in): 71 Pulse (bpm): 74 Weight (lbs): 205 Respiratory Rate (breaths/min): 16 Body Mass Index (BMI): 28.6 Blood Pressure (mmHg): 117/76 Reference Range: 80 - 120 mg / dl Electronic Signature(s) Signed: 09/09/2022 9:11:33 AM By: Tommie Ard RN Entered By: Tommie Ard on 09/09/2022 07:41:20

## 2022-12-31 DIAGNOSIS — F1111 Opioid abuse, in remission: Secondary | ICD-10-CM | POA: Diagnosis not present

## 2023-01-14 DIAGNOSIS — F1111 Opioid abuse, in remission: Secondary | ICD-10-CM | POA: Diagnosis not present

## 2023-01-28 DIAGNOSIS — F1111 Opioid abuse, in remission: Secondary | ICD-10-CM | POA: Diagnosis not present

## 2023-02-11 DIAGNOSIS — F1111 Opioid abuse, in remission: Secondary | ICD-10-CM | POA: Diagnosis not present

## 2023-02-25 DIAGNOSIS — F1111 Opioid abuse, in remission: Secondary | ICD-10-CM | POA: Diagnosis not present

## 2023-03-12 DIAGNOSIS — F1111 Opioid abuse, in remission: Secondary | ICD-10-CM | POA: Diagnosis not present

## 2023-03-17 DIAGNOSIS — Z6825 Body mass index (BMI) 25.0-25.9, adult: Secondary | ICD-10-CM | POA: Diagnosis not present

## 2023-03-17 DIAGNOSIS — L089 Local infection of the skin and subcutaneous tissue, unspecified: Secondary | ICD-10-CM | POA: Diagnosis not present

## 2023-03-17 DIAGNOSIS — L03115 Cellulitis of right lower limb: Secondary | ICD-10-CM | POA: Diagnosis not present

## 2023-03-17 DIAGNOSIS — Z7901 Long term (current) use of anticoagulants: Secondary | ICD-10-CM | POA: Diagnosis not present

## 2023-03-17 DIAGNOSIS — R748 Abnormal levels of other serum enzymes: Secondary | ICD-10-CM | POA: Diagnosis not present

## 2023-03-24 DIAGNOSIS — M810 Age-related osteoporosis without current pathological fracture: Secondary | ICD-10-CM | POA: Diagnosis not present

## 2023-03-24 DIAGNOSIS — D649 Anemia, unspecified: Secondary | ICD-10-CM | POA: Diagnosis not present

## 2023-03-24 DIAGNOSIS — L03115 Cellulitis of right lower limb: Secondary | ICD-10-CM | POA: Diagnosis not present

## 2023-03-24 DIAGNOSIS — Z6825 Body mass index (BMI) 25.0-25.9, adult: Secondary | ICD-10-CM | POA: Diagnosis not present

## 2023-03-25 DIAGNOSIS — F1111 Opioid abuse, in remission: Secondary | ICD-10-CM | POA: Diagnosis not present

## 2023-04-08 DIAGNOSIS — G609 Hereditary and idiopathic neuropathy, unspecified: Secondary | ICD-10-CM | POA: Diagnosis not present

## 2023-06-29 ENCOUNTER — Other Ambulatory Visit: Payer: Self-pay | Admitting: Orthopedic Surgery

## 2023-06-29 DIAGNOSIS — S81801A Unspecified open wound, right lower leg, initial encounter: Secondary | ICD-10-CM

## 2023-06-29 DIAGNOSIS — Z96651 Presence of right artificial knee joint: Secondary | ICD-10-CM

## 2023-06-29 NOTE — Progress Notes (Signed)
 Please see notes/labs in care everywhere for [facility] chart from office visits.

## 2023-07-08 ENCOUNTER — Encounter (HOSPITAL_BASED_OUTPATIENT_CLINIC_OR_DEPARTMENT_OTHER): Attending: Internal Medicine | Admitting: Internal Medicine

## 2023-07-08 DIAGNOSIS — Y831 Surgical operation with implant of artificial internal device as the cause of abnormal reaction of the patient, or of later complication, without mention of misadventure at the time of the procedure: Secondary | ICD-10-CM | POA: Insufficient documentation

## 2023-07-08 DIAGNOSIS — Z96651 Presence of right artificial knee joint: Secondary | ICD-10-CM | POA: Diagnosis not present

## 2023-07-08 DIAGNOSIS — T8131XA Disruption of external operation (surgical) wound, not elsewhere classified, initial encounter: Secondary | ICD-10-CM | POA: Diagnosis present

## 2023-07-08 DIAGNOSIS — T8142XA Infection following a procedure, deep incisional surgical site, initial encounter: Secondary | ICD-10-CM | POA: Diagnosis not present

## 2023-07-08 DIAGNOSIS — S81001A Unspecified open wound, right knee, initial encounter: Secondary | ICD-10-CM | POA: Insufficient documentation

## 2023-07-08 DIAGNOSIS — Z8614 Personal history of Methicillin resistant Staphylococcus aureus infection: Secondary | ICD-10-CM | POA: Insufficient documentation

## 2023-07-22 ENCOUNTER — Other Ambulatory Visit: Payer: Self-pay

## 2023-07-22 ENCOUNTER — Ambulatory Visit: Admitting: Infectious Diseases

## 2023-07-22 ENCOUNTER — Ambulatory Visit
Admission: RE | Admit: 2023-07-22 | Discharge: 2023-07-22 | Disposition: A | Discharge status: Home / Self Care | Source: Ambulatory Visit | Attending: Infectious Diseases | Admitting: Infectious Diseases

## 2023-07-22 ENCOUNTER — Encounter: Payer: Self-pay | Admitting: Infectious Diseases

## 2023-07-22 VITALS — BP 136/80 | HR 76 | Resp 16 | Ht 71.0 in | Wt 211.0 lb

## 2023-07-22 DIAGNOSIS — Z79899 Other long term (current) drug therapy: Secondary | ICD-10-CM

## 2023-07-22 DIAGNOSIS — T8453XD Infection and inflammatory reaction due to internal right knee prosthesis, subsequent encounter: Secondary | ICD-10-CM | POA: Diagnosis not present

## 2023-07-22 DIAGNOSIS — T8450XA Infection and inflammatory reaction due to unspecified internal joint prosthesis, initial encounter: Secondary | ICD-10-CM | POA: Insufficient documentation

## 2023-07-22 DIAGNOSIS — B1920 Unspecified viral hepatitis C without hepatic coma: Secondary | ICD-10-CM | POA: Diagnosis not present

## 2023-07-22 DIAGNOSIS — Z87891 Personal history of nicotine dependence: Secondary | ICD-10-CM

## 2023-07-22 DIAGNOSIS — I872 Venous insufficiency (chronic) (peripheral): Secondary | ICD-10-CM

## 2023-07-22 NOTE — Progress Notes (Addendum)
 Patient Active Problem List   Diagnosis Date Noted   S/P total knee replacement 05/18/2022   Preop cardiovascular exam 12/23/2021   Bilateral primary osteoarthritis of knee 10/22/2021   Lymphedema of leg 06/15/2018   Coronary artery disease coronary CT angio showing numerous 0 to 25% stenosis, tightest stenosis obtuse marginal branch 25 to 50% 06/15/2018   Lymphocele after surgical procedure 03/16/2017   Postoperative examination 02/09/2017   Inguinal lymphadenopathy 01/25/2017   Cellulitis of unspecified part of limb 01/25/2017   Dependence on continuous positive airway pressure ventilation 09/25/2015   Bilateral carotid bruits 08/08/2015   Smoking 08/08/2015   Pre-op evaluation 08/08/2015   Light cigarette smoker (1-9 cigs/day) 02/27/2015   Venous stasis 02/27/2015   Venous insufficiency of leg 08/28/2014   Elevated blood pressure reading 08/22/2014   Bacteremia 08/13/2014   Venous stasis dermatitis     Patient's Medications  New Prescriptions   No medications on file  Previous Medications   ASPIRIN 81 MG CHEWABLE TABLET    Chew 1 tablet (81 mg total) by mouth 2 (two) times daily.   ATORVASTATIN (LIPITOR) 10 MG TABLET    Take 1 tablet (10 mg total) by mouth daily.   HYDROMORPHONE (DILAUDID) 2 MG TABLET    Take 1-2 tablets (2-4 mg total) by mouth every 6 (six) hours as needed for severe pain (pain score 7-10).   METHADONE (DOLOPHINE) 10 MG/5ML SOLUTION    Take 195 mg by mouth daily.   METHOCARBAMOL 1000 MG TABS    Take 1,000 mg by mouth every 6 (six) hours as needed for muscle spasms.  Modified Medications   No medications on file  Discontinued Medications   No medications on file    Subjective: Discussed the use of AI scribe software for clinical note transcription with the patient, who gave verbal consent to proceed.   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   He he was following wound care in May 2024 for wound in his right lateral ankle.  He then underwent right knee replacement on September 14, 2022 with Dr. Macky Sayres complicated with a nonhealing wound in his right knee for approximately 37-month. He reports being on doxycycline for several months. Denies fevers, chills, night sweats. Denies nausea, vomiting or diarrhea. Denies any purulent drainage, redness or warmth but rt knee/leg with increasing swelling and serous drainage and had adipose tissue debris that came out a month ago from the wound.   Reports he saw Dr Macky Sayres who did his rt knee replacement approx 3 weeks ago and he reports he was told there is nothing they can do and referred to wound care and told him to continue calling them. He has not seen a vascular doctor in a long time, and reports his last visit to the wound care center was unhelpful for his knee wound. Last seen by wound care 07/08/23 for 7 months h/o non healing rt knee wound and was referred to ID.   He reports he has osteoporosis and a history of significant weight loss following bariatric surgery. He currently smokes about ten cigarettes a day. Denies alcohol and recreational drugs.  He is ambulatory.   He is somewhat tearful today stating that he has  not been able to get right help for his new wound.  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   Review of Systems: All systems reviewed including MSK and negative except as above  Past Medical History:  Diagnosis Date   Anemia    Arthritis    CAD (coronary artery disease)    CHF (congestive heart failure) (HCC)    PT. DENIES AT [facility]   Heart murmur    hx of small murmur    Hepatitis    Hepatitis C not treated for Hx of   History of kidney stones     Hypertension    Lymphocele    Right groin   Morbid obesity (HCC)    Venous insufficiency of leg    Venous stasis dermatitis    Past Surgical History:  Procedure Laterality Date   CARPAL TUNNEL RELEASE     bilateral    CYSTOSCOPY W/ URETERAL STENT PLACEMENT Bilateral 04/21/2016   Procedure: CYSTOSCOPY WITH RETROGRADE PYELOGRAM/URETERAL STENT PLACEMENT;  Surgeon: Bart Born, MD;  Location: [facility];  Service: Urology;  Laterality: Bilateral;   CYSTOSCOPY/URETEROSCOPY/HOLMIUM LASER/STENT PLACEMENT Bilateral 04/01/2016   Procedure: CYSTOSCOPY WITH RETROGRADE AND  STENT PLACEMENT;  Surgeon: Bart Born, MD;  Location: [facility];  Service: Urology;  Laterality: Bilateral;   CYSTOSCOPY/URETEROSCOPY/HOLMIUM LASER/STENT PLACEMENT Bilateral 04/21/2016   Procedure: CYSTOSCOPY/URETEROSCOPY/HOLMIUM LASER/STENT REPLACEMENT;  Surgeon: Bart Born, MD;  Location: [facility];  Service: Urology;  Laterality: Bilateral;   EYE SURGERY     bil cataracts   gastric bypass surgery   10/2015   GASTROPLASTY DUODENAL SWITCH  10/17/2015   HERNIA REPAIR     Hiatal hernia repair done with Gastric Bypass   HIP FRACTURE SURGERY Right 10/17/2019   Using Gamma Nail performed by Dr. Ozell Blunt   HOLMIUM LASER APPLICATION Bilateral 04/21/2016   Procedure: HOLMIUM LASER APPLICATION;  Surgeon: Bart Born, MD;  Location: [facility];  Service: Urology;  Laterality: Bilateral;   IR URETERAL STENT LEFT NEW ACCESS W/O SEP NEPHROSTOMY CATH  05/17/2018   LYMPH NODE BIOPSY Right 01/29/2017   Inguinal, performed by Dr. Jonita Neth   NEPHROLITHOTOMY Left 05/17/2018   Procedure: NEPHROLITHOTOMY PERCUTANEOUS;  Surgeon: Homero Luster, MD;  Location: [facility];  Service: Urology;  Laterality: Left;   TONSILLECTOMY     TOTAL KNEE ARTHROPLASTY Left 05/18/2022   Procedure: TOTAL KNEE ARTHROPLASTY;  Surgeon: Christie Cox, MD;  Location: [facility];  Service: Orthopedics;  Laterality: Left;   TOTAL KNEE ARTHROPLASTY Right 09/14/2022    Procedure: TOTAL KNEE ARTHROPLASTY;  Surgeon: Christie Cox, MD;  Location: [facility];  Service: Orthopedics;  Laterality: Right;    Social History   Tobacco Use   Smoking status: Former    Current packs/day: 0.00    Average packs/day: 0.5 packs/day for 35.0 years (17.5 ttl pk-yrs)    Types: Cigarettes    Start date: 05/16/1980    Quit date: 05/17/2015    Years since quitting: 8.1   Smokeless tobacco: Never  Vaping Use   Vaping status: Never Used  Substance Use Topics   Alcohol use: No   Drug use: Not Currently    Types: Heroin    Comment: last use 25 years ago    Family History  Problem Relation Age of Onset   Leukemia Mother        passed away 2008/08/22   Diabetes Father    Heart disease Father  Hypertension Father    Colon cancer Father     No Known Allergies  Health Maintenance  Topic Date Due   Hepatitis C Screening  Never done   DTaP/Tdap/Td (1 - Tdap) Never done   Pneumococcal Vaccine 39-73 Years old (1 of 2 - PCV) Never done   Zoster Vaccines- Shingrix (1 of 2) Never done   Medicare Annual Wellness (AWV)  06/05/2022   COVID-19 Vaccine (1 - 2024-25 season) Never done   INFLUENZA VACCINE  11/12/2023   Colonoscopy  10/21/2026   HIV Screening  Completed   HPV VACCINES  Aged Out   Meningococcal B Vaccine  Aged Out    Objective: BP 136/80   Pulse 76   Resp 16   Ht 5\' 11"  (1.803 m)   Wt 211 lb (95.7 kg)   SpO2 98%   BMI 29.43 kg/m    Physical Exam Constitutional:      Appearance: Normal appearance.  Tilted to the left side likely due to osteoporosis HENT:     Head: Normocephalic and atraumatic.      Mouth: Mucous membranes are moist.  Eyes:    Conjunctiva/sclera: Conjunctivae normal.     Pupils: Pupils are equal, round, and b/l symmetrical   Cardiovascular:     Rate and Rhythm: Normal rate and regular rhythm.     Heart sounds: s1s2  Pulmonary:     Effort: Pulmonary effort is normal.     Breath sounds: Normal breath sounds.   Abdominal:      General: Non distended     Palpations: soft.   Musculoskeletal:        General: ambulatory with walker.   Chronic changes of bilateral venous insufficiency in bilateral lower extremities    Left posterior calf with venous ulcer   Rt ankle with superficial venous ulcer   Rt knee with yellowish stuff from anterior knee, non purulent, no surrounding cellulitis    Exam notes from wound care noted with increased depth of about 6 cm that probes straight to either patella or hardware.   Skin:    General: Skin is warm and dry.     Comments:  Neurological:     General: grossly non focal     Mental Status: awake, alert and oriented to person, place, and time.   Psychiatric:        Mood and Affect: Mood normal.   Lab Results Lab Results  Component Value Date   WBC 4.5 09/10/2022   HGB 10.8 (L) 09/10/2022   HCT 33.2 (L) 09/10/2022   MCV 90.2 09/10/2022   PLT 272 09/10/2022    Lab Results  Component Value Date   CREATININE 0.71 09/10/2022   BUN 17 09/10/2022   NA 137 09/10/2022   K 3.5 09/10/2022   CL 102 09/10/2022   CO2 27 09/10/2022    Lab Results  Component Value Date   ALT 10 09/10/2022   AST 15 09/10/2022   ALKPHOS 82 09/10/2022   BILITOT 0.4 09/10/2022    Lab Results  Component Value Date   CHOL 149 12/23/2021   HDL 53 12/23/2021   LDLCALC 82 12/23/2021   TRIG 70 12/23/2021   CHOLHDL 2.8 12/23/2021   No results found for: "LABRPR", "RPRTITER" No results found for: "HIV1RNAQUANT", "HIV1RNAVL", "CD4TABS"   Assessment/Plan # Chronic non healing rt knee wound/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  I discussed with him IV antibiotics before undergoing surgery will not be curative in the long-term.  He said he will call his orthopedic surgeon today. -CBC, CMP ESR and CRP -X-ray right knee -Continue doxycycline in the interim pending follow-up with orthopedics.  If plan for operative intervention, would hold antibiotics at least 72 hours prior to OR. Addendum: he said he has enough refills of doxycycline. -Fu in 3-4 weeks   # Chronic venous insufficiency with venous ulcer - Discussed to follow-up with vascular  # Smoking  -Counseled on cutting down  # Chronic HCV  -Unclear current status or treatment history, will check HCVRNA next visit  I have personally spent 75 minutes involved in face-to-face and non-face-to-face activities for this patient on the day of the visit. Professional time spent includes the following activities: Preparing to see the patient (review of tests), Obtaining and/or reviewing separately obtained history (admission/discharge record), Performing a medically appropriate examination and/or evaluation , Ordering medications/tests/procedures, referring and communicating with other health care professionals, Documenting clinical information in the EMR, Independently interpreting results (not separately reported), Communicating results to the patient/family/caregiver, Counseling and educating the patient/family/caregiver and Care coordination (not separately reported).   Of note, portions of this note may have been created with voice recognition software. While this note has been edited for accuracy, occasional wrong-word or 'sound-a-like' substitutions may have occurred due to the inherent limitations of voice recognition software.   Melvina Stage, MD [facility] 07/22/2023, 7:32 AM

## 2023-07-23 ENCOUNTER — Telehealth: Payer: Self-pay

## 2023-07-23 ENCOUNTER — Ambulatory Visit (HOSPITAL_BASED_OUTPATIENT_CLINIC_OR_DEPARTMENT_OTHER): Admitting: General Surgery

## 2023-07-23 DIAGNOSIS — E876 Hypokalemia: Secondary | ICD-10-CM

## 2023-07-23 LAB — COMPREHENSIVE METABOLIC PANEL WITH GFR
AG Ratio: 0.9 (calc) — ABNORMAL LOW (ref 1.0–2.5)
ALT: 8 U/L — ABNORMAL LOW (ref 9–46)
AST: 12 U/L (ref 10–35)
Albumin: 3.4 g/dL — ABNORMAL LOW (ref 3.6–5.1)
Alkaline phosphatase (APISO): 70 U/L (ref 35–144)
BUN/Creatinine Ratio: 24 (calc) — ABNORMAL HIGH (ref 6–22)
BUN: 15 mg/dL (ref 7–25)
CO2: 26 mmol/L (ref 20–32)
Calcium: 8.5 mg/dL — ABNORMAL LOW (ref 8.6–10.3)
Chloride: 103 mmol/L (ref 98–110)
Creat: 0.63 mg/dL — ABNORMAL LOW (ref 0.70–1.35)
Globulin: 3.8 g/dL — ABNORMAL HIGH (ref 1.9–3.7)
Glucose, Bld: 63 mg/dL — ABNORMAL LOW (ref 65–99)
Potassium: 3.3 mmol/L — ABNORMAL LOW (ref 3.5–5.3)
Sodium: 136 mmol/L (ref 135–146)
Total Bilirubin: 0.3 mg/dL (ref 0.2–1.2)
Total Protein: 7.2 g/dL (ref 6.1–8.1)
eGFR: 108 mL/min/{1.73_m2} (ref >=60)

## 2023-07-23 LAB — CBC
HCT: 29 % — ABNORMAL LOW (ref 38.5–50.0)
Hemoglobin: 9.1 g/dL — ABNORMAL LOW (ref 13.2–17.1)
MCH: 25 pg — ABNORMAL LOW (ref 27.0–33.0)
MCHC: 31.4 g/dL — ABNORMAL LOW (ref 32.0–36.0)
MCV: 79.7 fL — ABNORMAL LOW (ref 80.0–100.0)
MPV: 9.6 fL (ref 7.5–12.5)
Platelets: 412 10*3/uL — ABNORMAL HIGH (ref 140–400)
RBC: 3.64 10*6/uL — ABNORMAL LOW (ref 4.20–5.80)
RDW: 15.9 % — ABNORMAL HIGH (ref 11.0–15.0)
WBC: 6.3 10*3/uL (ref 3.8–10.8)

## 2023-07-23 LAB — C-REACTIVE PROTEIN: CRP: 35.9 mg/L — ABNORMAL HIGH (ref <8.0)

## 2023-07-23 LAB — SEDIMENTATION RATE: Sed Rate: 63 mm/h — ABNORMAL HIGH (ref 0–20)

## 2023-07-23 MED ORDER — POTASSIUM CHLORIDE CRYS ER 20 MEQ PO TBCR: 40.0000 meq | EXTENDED_RELEASE_TABLET | Freq: Once | ORAL | 0 refills | Status: AC

## 2023-07-23 NOTE — Telephone Encounter (Signed)
-----   Message from Victoriano Lain sent at 07/23/2023  9:58 AM EDT ----- Labs reviewed as well as Xray. No acute findings   Triage, please let him know that Xray concerning for cellulitis and he needs to see Orthopedics asap for concerns of prosthetic knee infection. I have discussed this with him yesterday as well.   Pharmacy team, please send him one dose of KCL for k 3.3   Thank you.

## 2023-07-23 NOTE — Telephone Encounter (Signed)
 Spoke with Fines, let him know that no acute lab findings and that Xray showed concern for cellulitis per Dr. Elinor Parkinson.  Advised him she would like him to reach out to his Ortho team ASAP for concern of prosthetic knee infection. He states he reached out to them yesterday and has not heard back yet. Encouraged him to call again this morning.   Discussed low potassium and need for one-time dose, he would like this sent to the [facility] in [place].   Patient verbalized understanding and has no further questions.   Linna Hoff, BSN, RN

## 2023-07-24 DIAGNOSIS — B1920 Unspecified viral hepatitis C without hepatic coma: Secondary | ICD-10-CM | POA: Insufficient documentation

## 2023-07-27 ENCOUNTER — Other Ambulatory Visit: Payer: Self-pay | Admitting: Infectious Diseases

## 2023-07-27 DIAGNOSIS — T8450XA Infection and inflammatory reaction due to unspecified internal joint prosthesis, initial encounter: Secondary | ICD-10-CM

## 2023-07-27 NOTE — Telephone Encounter (Signed)
 faxing referral and notes to [facility] in [place]  Fax [phone] Phone  [phone] This is where Dr Christie Cox office has sent the patient  I called them and they said for the patient to call to schedule I have called him at both numbers listed and left a message to call them and on the message he left for Dr Genevive Ket he did not leave a phone #

## 2023-07-27 NOTE — Telephone Encounter (Signed)
 Left voicemail informing patient of referral and it would be best to follow up with neurosurgeon who done his surgery.   Kein Carlberg Roann Chestnut, CMA

## 2023-07-27 NOTE — Telephone Encounter (Signed)
 Patient called stating that he is unable to get in contact with Orthopedic doctor and has left message with nurse for doctor to call him back. Patient stated that Dr.Manandhar would refer him to Orthopedic doctor.

## 2023-08-19 ENCOUNTER — Ambulatory Visit: Admitting: Infectious Diseases

## 2023-10-04 NOTE — Progress Notes (Signed)
 Pt sent message via [facility] about thrush  Nystatin swish and swallow e-scribed to [employer]

## 2023-10-06 NOTE — Nursing Note (Signed)
 Pt is a No Show for PICC appointment. Will remove from schedule. Dr. Sonjia Rockers was notified via secure chat.

## 2023-10-18 NOTE — Telephone Encounter (Signed)
 Called [name] back in response to his [facility] message.  He notes that since starting rifampin he has had severe sweats, severe nausea, drenching sweats to the point it saturates his sheets and clothing, inability to sleep, which are similar symptoms he has had with methadone  withdrawal in the past. The nausea was severe.   He stopped taking the rifampin last week after reading that it can lower methadone  levels due to the interaction. The symptoms are a little less severe today now that he has been off rifampin since Thursday.  He also stopped taking the minocycline because he was concerned about taking anything.  He notes a wound over the leg, about the size of a fifty cent piece, which is lower leg. No joint pain. No overlying redness near the wound. Wound is more superficial. No yellow or green discharge.  Recommended that he restart the minocycline given that the symptoms above (severe sweats, severe nausea, drenching sweats, inability to sleep) are most likely related to rifampin and the drug interaction with methadone  (which can lower methadone  levels up to greater than 20%). Recommend restarting the minocycline to see if he can tolerate it by itself since we wouldn't want him off all antibiotics in the setting of infection.  Discussed that there isn't an oral alternative that works similar to rifampin because of the way rifampin works. Discussed that he should stop the rifampin as he has done. Discussed that Dr. Jiles will be back tomorrow and hopefully they can move his appointment with her up to an earlier date to assess him and assess how the wound is.

## 2023-10-24 NOTE — Progress Notes (Signed)
 Subjective Patient ID: [name] is a 63 y.o. male.   Chief Complaint:   Follow-up for infected right total knee arthroplasty    HPI  Pt presents for follow-up of infected right total knee arthroplasty   Received message from home health nurse on 6/16 that PICC line had partially been pulled out from it's original placement.  Attempted to have pt undergo placement of new PICC line on 6/23 and on 6/26 but patient did not show for either of those radiology appointments. Home heath RN had difficulty getting a hold of patient  Old PICC line was removed.  Patient was instructed to start taking minocycline and to continue rifampin.  Patient did not start taking minocycline until 6/27 due to cost of the antibiotic.  Patient stopped taking rifampin last week on 7/3 because he was having symptoms of methadone  withdrawal due to interaction between rifampin and methadone  in which methadone  level can be lowered by rifampin. Patient reports no problem with taking minocycline.    Patient reports having a superficial wound on his right lower leg for a couple of weeks with drainage.  Patient reports sticky discharge but no odor.  Patient reports drainage has stopped.  Patient reports having 3 spots on his right knee which he feels to be stitches.  Patient reports pain to his right knee at times after walking.  Patient reports pain to his right knee has decreased.  Patient reports right knee swelling has decreased.   Patient states that he ambulates with assistance of a walker.  Denies fever and chills but reports night sweats which is not bad.  Denies nausea, vomiting, abdominal pain.  Patient reports having 3 loose stools yesterday and none today.  Denies shortness of breath, chest pain, cough, dizziness, lightheadedness.    ROS:  Constitutional:  No fever, No chills, Reports night sweats which is not bad.   Respiratory:  No shortness of breath, No cough.   Cardiovascular:  No chest pain.    Gastrointestinal:  No nausea, No vomiting, No diarrhea, No abdominal pain.   Integumentary:  No rash.   Allergies: Patient has no known allergies.    MEDICATIONS: Current Medications[1]    VITALS Blood pressure 94/62, pulse 66, temperature 97 F (36.1 C), temperature source Temporal, height 1.803 m (5' 11), weight 85.3 kg (188 lb), SpO2 100%.    Physical Exam:   On room air  General: Alert and oriented, no acute distress, nontoxic-appearing, not acutely ill-appearing Eye: Extraocular movements are intact.  Normal conjunctiva.  Anicteric sclera. Respiratory: Lungs are clear to auscultation.  Respirations are nonlabored.  Breath sounds are equal.  Symmetrical chest wall expansion Cardiovascular: Normal rate.  Regular rhythm.   Gastrointestinal: Soft.  Nontender.  Nondistended.  Decreased bowel sounds.   Lymphatics: No lymphadenopathy in cervical, supraclavicular Musculoskeletal: Right knee:  Surgical site is healed except for very small scab at the inferior aspect of the incision, no erythema, no tenderness to palpation, some swelling, no drainage Integumentary: No rash Neurologic: Alert.  Oriented.  No focal deficits. Pt is in a wheelchair   Right knee       LABS:  06/14/2023 Right knee aspiration fluid cell count:  65,000 nucleated cells (95% N/4% L/1% M), 59,000 RBC    Micro:     08/30/23 right knee x 2: GMS: No organism; culture:negative 08/30/23 right knee x 1: GMS: No organism; culture: Corynebacterium striatum group   06/14/2023 right knee fluid: MRSA   12/03/22 Right knee swab culture:  MRSA.      ASSESSMENT  Infected right total knee arthroplasty - S/p index right total knee arthroplasty in June 2024. - Reports right knee pain, swelling, and erythema.  Pt reports having a draining wound on right knee incisional site - Right knee swab culture on 12/03/2022 yielded MRSA. - S/p right knee aspiration on 06/14/2023 with cell count demonstrating 65,000  nucleated cells with 95% N, and culture yielded MRSA.   - Patient reports taking doxycycline  since August 2024. - S/p right knee I&D, right total knee arthroplasty explantation and replacement of new prosthesis with high dose antibiotic cement and retained original intramedullary nail on 08/30/23.   - Per orthopedic surgeon Dr Honor, patient had an open draining sinus tract, no gross purulence but significant tissue necrosis encountered.  Dr Serino indicates that pt is at risk for right leg amputation  - Intraoperative cultures from right knee yielded Corynebacterium striatum group in 1 out of 3 specimens with no organism on Gram stain - Discharged with IV Vancomycin  and PO Rifampin for 6 weeks - IV Vancomycin  was stopped early on 09/27/23 since PICC line had partially come out of it's original placement - 2 attempts made for pt to have PICC line replaced but he did not show for the appointment.  Home health RN had difficulty being able to reach patient.  Old PICC line was removed - Minocycline replaced IV vancomycin  which he started on 6/27 and rifampin was continued - Pt stopped taking Rifampin on 10/14/23 due to drug-drug interaction with methadone  with methadone  level being lowered by Rifampin causing methadone  withdrawal symptoms - Pt is tolerating Minocycline - Discussed with [facility] pharmacist Jerald.  No CLSI breakpoint for minocycline for Corynebacterium striatum.  [facility] pharmacist report that it is hard to say what target would be for suppression - 4 is maybe attainable but unclear. No good oral antibiotic option for long term suppression outside of Linezolid which can cause myelosuppression starting after 2 weeks of treatment. Minocycline will be able to give for suppression of MRSA. Discussed with pt that there are no good oral antibiotic options for suppression of Corynebacterium striatum.   - Pt reports no systemic symptom.  Pt reports that he had drainage at right knee surgical site which has  stopped.  Pt reports right knee pain present but decreased.  Surgical site on right knee is healed except for a small scab at inferior aspect of right knee incision. No sign of infection to right knee on exam.     2.  Long term antibiotic use    PLAN  Continue Minocycline 100 mg 1 tab PO BID  Discussed with pt about potential side effects of Minocycline.  Discussed with patient that minocycline can cause sunburn/photosensitivity easily and for him to wear sunscreen when outdoors while taking Minocycline.  Discussed with patient that minocycline can cause darkening of skin hyperpigmentation which would not be reversible. Discussed with patient that if he takes multivitamin, calcium , iron, magnesium, zinc , Antacids such as Rolaids or Tums, then he should take these medications at least 2 hours before or 4 hours after taking minocycline to prevent decreased absorption of minocycline. Minocycline 30 days supply with 5 refills e-scribed on 10/04/23 to [facility] pharmacy RTC in 3 months   I am managing pt's chronic medical condition of infected right total knee arthroplasty    Electronically signed: Sonjia Rockers, MD 10/24/2023  3:57 PM       [1] Current Outpatient Medications  Medication Sig Dispense Refill  .  aspirin  81 mg chewable tablet Chew 1 tablet (81 mg total) 2 (two) times a day. 60 tablet 0  . ergocalciferol  (VITAMIN D2) 1,250 mcg (50,000 unit) capsule Take 50,000 Units by mouth 2 (two) times a week.    . methadone  HCl (methadone  non-concentrated) 10 mg/5 mL soln oral liquid Take 205 mg by mouth every morning Indications: symptoms from stopping treatment with opioid drugs.    . minocycline (MINOCIN) 100 mg capsule Take 1 capsule (100 mg total) by mouth 2 (two) times a day. 60 capsule 5  . denosumab (Prolia) 60 mg/mL syrg syringe Inject 60 mg under the skin every 6 (six) months.    . ferrous sulfate  325 mg (65 mg iron) tablet Take 325 mg by mouth daily with breakfast.    . meloxicam  (MOBIC) 7.5 mg tablet Take 1 tablet (7.5 mg total) by mouth daily. 30 tablet 1  . rifAMPin (RIFADIN) 300 mg capsule Take 2 capsules (600 mg total) by mouth daily. (Patient not taking: Reported on 10/20/2023) 92 capsule 0   No current facility-administered medications for this visit.

## 2023-10-29 ENCOUNTER — Ambulatory Visit (HOSPITAL_BASED_OUTPATIENT_CLINIC_OR_DEPARTMENT_OTHER): Admitting: General Surgery

## 2023-11-10 ENCOUNTER — Encounter (HOSPITAL_BASED_OUTPATIENT_CLINIC_OR_DEPARTMENT_OTHER): Attending: General Surgery | Admitting: General Surgery

## 2023-11-26 ENCOUNTER — Encounter (HOSPITAL_BASED_OUTPATIENT_CLINIC_OR_DEPARTMENT_OTHER): Admitting: General Surgery

## 2023-12-22 ENCOUNTER — Encounter (HOSPITAL_BASED_OUTPATIENT_CLINIC_OR_DEPARTMENT_OTHER): Attending: General Surgery | Admitting: General Surgery

## 2024-02-29 ENCOUNTER — Ambulatory Visit: Admitting: Infectious Diseases

## 2024-03-30 ENCOUNTER — Ambulatory Visit: Admitting: Infectious Diseases
